# Patient Record
Sex: Male | Born: 1945 | Race: White | Hispanic: No | Marital: Married | Smoking: Former smoker
Health system: Southern US, Community
[De-identification: ages and names within clinical notes are randomized; demographics above are authoritative.]

## PROBLEM LIST (undated history)

## (undated) DIAGNOSIS — Z720 Tobacco use: Secondary | ICD-10-CM

## (undated) DIAGNOSIS — I499 Cardiac arrhythmia, unspecified: Secondary | ICD-10-CM

## (undated) DIAGNOSIS — I2510 Atherosclerotic heart disease of native coronary artery without angina pectoris: Secondary | ICD-10-CM

## (undated) DIAGNOSIS — I420 Dilated cardiomyopathy: Secondary | ICD-10-CM

## (undated) DIAGNOSIS — I5020 Unspecified systolic (congestive) heart failure: Secondary | ICD-10-CM

## (undated) DIAGNOSIS — E785 Hyperlipidemia, unspecified: Secondary | ICD-10-CM

## (undated) DIAGNOSIS — I10 Essential (primary) hypertension: Secondary | ICD-10-CM

## (undated) DIAGNOSIS — I509 Heart failure, unspecified: Secondary | ICD-10-CM

## (undated) DIAGNOSIS — C3411 Malignant neoplasm of upper lobe, right bronchus or lung: Secondary | ICD-10-CM

## (undated) DIAGNOSIS — C801 Malignant (primary) neoplasm, unspecified: Secondary | ICD-10-CM

## (undated) DIAGNOSIS — E119 Type 2 diabetes mellitus without complications: Secondary | ICD-10-CM

## (undated) DIAGNOSIS — F1010 Alcohol abuse, uncomplicated: Secondary | ICD-10-CM

## (undated) DIAGNOSIS — I639 Cerebral infarction, unspecified: Secondary | ICD-10-CM

## (undated) DIAGNOSIS — Z8719 Personal history of other diseases of the digestive system: Secondary | ICD-10-CM

## (undated) DIAGNOSIS — I219 Acute myocardial infarction, unspecified: Secondary | ICD-10-CM

## (undated) DIAGNOSIS — K219 Gastro-esophageal reflux disease without esophagitis: Secondary | ICD-10-CM

## (undated) DIAGNOSIS — I4891 Unspecified atrial fibrillation: Secondary | ICD-10-CM

## (undated) DIAGNOSIS — Z9221 Personal history of antineoplastic chemotherapy: Secondary | ICD-10-CM

## (undated) DIAGNOSIS — R0602 Shortness of breath: Secondary | ICD-10-CM

## (undated) HISTORY — DX: Alcohol abuse, uncomplicated: F10.10

## (undated) HISTORY — DX: Type 2 diabetes mellitus without complications: E11.9

## (undated) HISTORY — DX: Essential (primary) hypertension: I10

## (undated) HISTORY — DX: Dilated cardiomyopathy: I42.0

## (undated) HISTORY — DX: Malignant neoplasm of upper lobe, right bronchus or lung: C34.11

## (undated) HISTORY — DX: Unspecified systolic (congestive) heart failure: I50.20

## (undated) HISTORY — DX: Unspecified atrial fibrillation: I48.91

## (undated) HISTORY — DX: Tobacco use: Z72.0

## (undated) HISTORY — PX: CORONARY ARTERY BYPASS GRAFT: SHX141

## (undated) HISTORY — DX: Heart failure, unspecified: I50.9

## (undated) HISTORY — DX: Hyperlipidemia, unspecified: E78.5

## (undated) HISTORY — DX: Atherosclerotic heart disease of native coronary artery without angina pectoris: I25.10

---

## 2003-07-15 ENCOUNTER — Inpatient Hospital Stay (HOSPITAL_COMMUNITY): Admission: EM | Admit: 2003-07-15 | Discharge: 2003-07-17 | Payer: Self-pay | Admitting: Emergency Medicine

## 2008-12-03 HISTORY — PX: UMBILICAL HERNIA REPAIR: SHX196

## 2008-12-22 ENCOUNTER — Ambulatory Visit: Payer: Self-pay | Admitting: *Deleted

## 2008-12-23 ENCOUNTER — Encounter: Payer: Self-pay | Admitting: Cardiology

## 2008-12-23 ENCOUNTER — Inpatient Hospital Stay (HOSPITAL_COMMUNITY): Admission: EM | Admit: 2008-12-23 | Discharge: 2008-12-29 | Payer: Self-pay | Admitting: Emergency Medicine

## 2008-12-23 ENCOUNTER — Ambulatory Visit: Payer: Self-pay | Admitting: Surgery

## 2008-12-23 DIAGNOSIS — I2510 Atherosclerotic heart disease of native coronary artery without angina pectoris: Secondary | ICD-10-CM

## 2008-12-24 HISTORY — PX: CORONARY ARTERY BYPASS GRAFT: SHX141

## 2009-01-25 ENCOUNTER — Encounter: Admission: RE | Admit: 2009-01-25 | Discharge: 2009-01-25 | Payer: Self-pay | Admitting: Surgery

## 2009-01-25 ENCOUNTER — Ambulatory Visit: Payer: Self-pay | Admitting: Surgery

## 2009-01-27 ENCOUNTER — Encounter: Payer: Self-pay | Admitting: Cardiovascular Disease

## 2009-01-27 ENCOUNTER — Ambulatory Visit: Payer: Self-pay | Admitting: Cardiovascular Disease

## 2009-01-27 DIAGNOSIS — I4891 Unspecified atrial fibrillation: Secondary | ICD-10-CM | POA: Insufficient documentation

## 2009-01-27 DIAGNOSIS — E785 Hyperlipidemia, unspecified: Secondary | ICD-10-CM | POA: Insufficient documentation

## 2009-01-28 DIAGNOSIS — I5023 Acute on chronic systolic (congestive) heart failure: Secondary | ICD-10-CM

## 2009-02-01 ENCOUNTER — Ambulatory Visit: Payer: Self-pay | Admitting: Surgery

## 2009-02-01 ENCOUNTER — Ambulatory Visit: Payer: Self-pay | Admitting: Internal Medicine

## 2009-02-01 ENCOUNTER — Encounter: Admission: RE | Admit: 2009-02-01 | Discharge: 2009-02-01 | Payer: Self-pay | Admitting: Surgery

## 2009-02-02 ENCOUNTER — Encounter (INDEPENDENT_AMBULATORY_CARE_PROVIDER_SITE_OTHER): Payer: Self-pay | Admitting: General Surgery

## 2009-02-02 ENCOUNTER — Inpatient Hospital Stay (HOSPITAL_COMMUNITY): Admission: EM | Admit: 2009-02-02 | Discharge: 2009-02-11 | Payer: Self-pay | Admitting: Emergency Medicine

## 2009-02-04 ENCOUNTER — Encounter: Payer: Self-pay | Admitting: Cardiovascular Disease

## 2009-03-02 ENCOUNTER — Ambulatory Visit: Payer: Self-pay | Admitting: Cardiology

## 2009-03-03 ENCOUNTER — Ambulatory Visit: Payer: Self-pay | Admitting: Cardiovascular Disease

## 2009-03-03 ENCOUNTER — Encounter: Payer: Self-pay | Admitting: Cardiovascular Disease

## 2009-03-10 ENCOUNTER — Ambulatory Visit: Payer: Self-pay | Admitting: Cardiovascular Disease

## 2009-03-10 ENCOUNTER — Ambulatory Visit: Payer: Self-pay | Admitting: Internal Medicine

## 2009-03-10 LAB — CONVERTED CEMR LAB
CO2: 29 meq/L (ref 19–32)
Glucose, Bld: 90 mg/dL (ref 70–99)
Potassium: 4.7 meq/L (ref 3.5–5.1)
Sodium: 135 meq/L (ref 135–145)

## 2009-03-18 ENCOUNTER — Ambulatory Visit: Payer: Self-pay | Admitting: Internal Medicine

## 2009-03-21 ENCOUNTER — Telehealth: Payer: Self-pay | Admitting: Cardiovascular Disease

## 2009-04-05 ENCOUNTER — Ambulatory Visit: Payer: Self-pay | Admitting: Cardiovascular Disease

## 2009-04-05 ENCOUNTER — Ambulatory Visit: Payer: Self-pay | Admitting: Internal Medicine

## 2009-04-05 ENCOUNTER — Encounter (INDEPENDENT_AMBULATORY_CARE_PROVIDER_SITE_OTHER): Payer: Self-pay

## 2009-04-22 ENCOUNTER — Ambulatory Visit: Payer: Self-pay | Admitting: Cardiology

## 2009-05-04 ENCOUNTER — Encounter: Payer: Self-pay | Admitting: *Deleted

## 2009-05-19 ENCOUNTER — Encounter: Payer: Self-pay | Admitting: Cardiovascular Disease

## 2009-05-19 ENCOUNTER — Ambulatory Visit: Payer: Self-pay

## 2009-05-19 ENCOUNTER — Ambulatory Visit: Payer: Self-pay | Admitting: Internal Medicine

## 2009-05-19 ENCOUNTER — Ambulatory Visit: Payer: Self-pay | Admitting: Cardiovascular Disease

## 2009-05-19 LAB — CONVERTED CEMR LAB: POC INR: 4.9

## 2009-05-25 ENCOUNTER — Telehealth: Payer: Self-pay | Admitting: Cardiovascular Disease

## 2009-05-25 LAB — CONVERTED CEMR LAB
ALT: 16 units/L (ref 0–53)
Alkaline Phosphatase: 67 units/L (ref 39–117)
Bilirubin, Direct: 0.1 mg/dL (ref 0.0–0.3)
CO2: 28 meq/L (ref 19–32)
Chloride: 99 meq/L (ref 96–112)
HDL: 29.8 mg/dL — ABNORMAL LOW (ref >39.00)
LDL Cholesterol: 97 mg/dL (ref 0–99)
Potassium: 4.2 meq/L (ref 3.5–5.1)
Sodium: 134 meq/L — ABNORMAL LOW (ref 135–145)
Total Bilirubin: 0.9 mg/dL (ref 0.3–1.2)
Total CHOL/HDL Ratio: 5
Total Protein: 7.6 g/dL (ref 6.0–8.3)
Triglycerides: 116 mg/dL (ref 0.0–149.0)

## 2009-05-27 ENCOUNTER — Encounter (INDEPENDENT_AMBULATORY_CARE_PROVIDER_SITE_OTHER): Payer: Self-pay | Admitting: *Deleted

## 2009-06-02 ENCOUNTER — Ambulatory Visit: Payer: Self-pay | Admitting: Cardiology

## 2009-06-02 LAB — CONVERTED CEMR LAB: POC INR: 2.6

## 2009-06-03 ENCOUNTER — Ambulatory Visit: Payer: Self-pay | Admitting: Cardiology

## 2009-06-03 ENCOUNTER — Ambulatory Visit (HOSPITAL_COMMUNITY): Admission: RE | Admit: 2009-06-03 | Discharge: 2009-06-03 | Payer: Self-pay | Admitting: Cardiovascular Disease

## 2009-06-08 ENCOUNTER — Encounter: Payer: Self-pay | Admitting: *Deleted

## 2009-06-13 ENCOUNTER — Ambulatory Visit: Payer: Self-pay | Admitting: Cardiovascular Disease

## 2009-06-13 ENCOUNTER — Encounter (INDEPENDENT_AMBULATORY_CARE_PROVIDER_SITE_OTHER): Payer: Self-pay | Admitting: Cardiology

## 2009-06-13 LAB — CONVERTED CEMR LAB: POC INR: 3.7

## 2009-06-15 DIAGNOSIS — I5022 Chronic systolic (congestive) heart failure: Secondary | ICD-10-CM

## 2009-06-21 ENCOUNTER — Telehealth: Payer: Self-pay | Admitting: Cardiovascular Disease

## 2009-07-04 ENCOUNTER — Ambulatory Visit: Payer: Self-pay | Admitting: Internal Medicine

## 2009-07-04 LAB — CONVERTED CEMR LAB
POC INR: 3.3
Prothrombin Time: 21.8 s

## 2009-08-01 ENCOUNTER — Ambulatory Visit: Payer: Self-pay | Admitting: Cardiology

## 2009-09-06 ENCOUNTER — Ambulatory Visit: Payer: Self-pay | Admitting: Cardiology

## 2009-09-06 LAB — CONVERTED CEMR LAB: POC INR: 2.8

## 2009-09-12 ENCOUNTER — Ambulatory Visit: Payer: Self-pay | Admitting: Cardiovascular Disease

## 2009-10-04 ENCOUNTER — Ambulatory Visit: Payer: Self-pay | Admitting: Cardiovascular Disease

## 2009-10-04 ENCOUNTER — Ambulatory Visit: Payer: Self-pay | Admitting: Cardiology

## 2009-10-11 ENCOUNTER — Encounter: Payer: Self-pay | Admitting: Cardiovascular Disease

## 2009-10-11 LAB — CONVERTED CEMR LAB
AST: 21 units/L (ref 0–37)
Alkaline Phosphatase: 56 units/L (ref 39–117)
BUN: 22 mg/dL (ref 6–23)
Bilirubin, Direct: 0.1 mg/dL (ref 0.0–0.3)
CO2: 28 meq/L (ref 19–32)
Calcium: 9.2 mg/dL (ref 8.4–10.5)
Chloride: 97 meq/L (ref 96–112)
Cholesterol: 111 mg/dL (ref 0–200)
Creatinine, Ser: 1.2 mg/dL (ref 0.4–1.5)
LDL Cholesterol: 63 mg/dL (ref 0–99)
Total CHOL/HDL Ratio: 3

## 2009-11-01 ENCOUNTER — Ambulatory Visit: Payer: Self-pay | Admitting: Cardiovascular Disease

## 2009-11-21 ENCOUNTER — Ambulatory Visit: Payer: Self-pay | Admitting: Internal Medicine

## 2009-12-19 ENCOUNTER — Encounter (INDEPENDENT_AMBULATORY_CARE_PROVIDER_SITE_OTHER): Payer: Self-pay | Admitting: *Deleted

## 2009-12-19 ENCOUNTER — Ambulatory Visit: Payer: Self-pay | Admitting: Cardiology

## 2009-12-19 ENCOUNTER — Ambulatory Visit: Payer: Self-pay | Admitting: Cardiovascular Disease

## 2009-12-19 LAB — CONVERTED CEMR LAB: POC INR: 2.1

## 2010-02-06 ENCOUNTER — Ambulatory Visit: Payer: Self-pay | Admitting: Internal Medicine

## 2010-02-06 LAB — CONVERTED CEMR LAB: POC INR: 2.1

## 2010-02-16 ENCOUNTER — Telehealth: Payer: Self-pay | Admitting: Cardiovascular Disease

## 2010-04-03 ENCOUNTER — Ambulatory Visit: Payer: Self-pay | Admitting: Cardiology

## 2010-04-03 LAB — CONVERTED CEMR LAB: POC INR: 2.4

## 2010-06-02 ENCOUNTER — Ambulatory Visit: Payer: Self-pay | Admitting: Cardiovascular Disease

## 2010-06-02 ENCOUNTER — Ambulatory Visit: Payer: Self-pay | Admitting: Cardiology

## 2010-06-02 LAB — CONVERTED CEMR LAB: POC INR: 2.1

## 2010-06-09 LAB — CONVERTED CEMR LAB
Bilirubin, Direct: 0.2 mg/dL (ref 0.0–0.3)
Calcium: 8.7 mg/dL (ref 8.4–10.5)
Creatinine, Ser: 1.1 mg/dL (ref 0.4–1.5)
GFR calc non Af Amer: 69.24 mL/min (ref >60)
Sodium: 133 meq/L — ABNORMAL LOW (ref 135–145)
Total Bilirubin: 0.7 mg/dL (ref 0.3–1.2)

## 2010-07-03 ENCOUNTER — Ambulatory Visit: Payer: Self-pay | Admitting: Cardiovascular Disease

## 2010-08-01 ENCOUNTER — Ambulatory Visit: Payer: Self-pay | Admitting: Cardiovascular Disease

## 2010-08-01 LAB — CONVERTED CEMR LAB: POC INR: 1.9

## 2010-08-24 ENCOUNTER — Ambulatory Visit: Payer: Self-pay | Admitting: Cardiology

## 2010-08-24 LAB — CONVERTED CEMR LAB: POC INR: 2.3

## 2010-09-18 ENCOUNTER — Ambulatory Visit: Payer: Self-pay | Admitting: Cardiology

## 2010-09-18 LAB — CONVERTED CEMR LAB: POC INR: 2.8

## 2010-10-19 ENCOUNTER — Ambulatory Visit: Payer: Self-pay | Admitting: Cardiovascular Disease

## 2010-10-19 LAB — CONVERTED CEMR LAB: POC INR: 2.1

## 2010-11-22 ENCOUNTER — Ambulatory Visit: Payer: Self-pay | Admitting: Cardiovascular Disease

## 2010-11-22 ENCOUNTER — Ambulatory Visit: Payer: Self-pay | Admitting: Cardiology

## 2010-11-22 LAB — CONVERTED CEMR LAB: POC INR: 2.3

## 2010-12-03 HISTORY — PX: HERNIA REPAIR: SHX51

## 2010-12-21 ENCOUNTER — Ambulatory Visit: Admission: RE | Admit: 2010-12-21 | Discharge: 2010-12-21 | Payer: Self-pay | Source: Home / Self Care

## 2010-12-21 ENCOUNTER — Other Ambulatory Visit: Payer: Self-pay | Admitting: Cardiovascular Disease

## 2010-12-21 ENCOUNTER — Ambulatory Visit
Admission: RE | Admit: 2010-12-21 | Discharge: 2010-12-21 | Payer: Self-pay | Source: Home / Self Care | Attending: Cardiovascular Disease | Admitting: Cardiovascular Disease

## 2010-12-21 LAB — HEPATIC FUNCTION PANEL
ALT: 17 U/L (ref 0–53)
AST: 24 U/L (ref 0–37)
Albumin: 3.8 g/dL (ref 3.5–5.2)
Alkaline Phosphatase: 51 U/L (ref 39–117)
Bilirubin, Direct: 0.2 mg/dL (ref 0.0–0.3)
Total Bilirubin: 0.7 mg/dL (ref 0.3–1.2)
Total Protein: 7.3 g/dL (ref 6.0–8.3)

## 2010-12-21 LAB — BASIC METABOLIC PANEL
BUN: 18 mg/dL (ref 6–23)
CO2: 28 mEq/L (ref 19–32)
Calcium: 8.9 mg/dL (ref 8.4–10.5)
Chloride: 96 mEq/L (ref 96–112)
Creatinine, Ser: 1.1 mg/dL (ref 0.4–1.5)
GFR: 71.3 mL/min (ref >60.00)
Glucose, Bld: 93 mg/dL (ref 70–99)
Potassium: 4.5 mEq/L (ref 3.5–5.1)
Sodium: 129 mEq/L — ABNORMAL LOW (ref 135–145)

## 2010-12-21 LAB — LIPID PANEL
Cholesterol: 102 mg/dL (ref 0–200)
HDL: 40.4 mg/dL (ref >39.00)
LDL Cholesterol: 45 mg/dL (ref 0–99)
Total CHOL/HDL Ratio: 3
Triglycerides: 85 mg/dL (ref 0.0–149.0)
VLDL: 17 mg/dL (ref 0.0–40.0)

## 2011-01-02 NOTE — Medication Information (Signed)
Summary: rov/sel  Anticoagulant Therapy  Managed by: Lyna Poser, PharmD Referring MD: Tonny Bollman PCP: [facility] Physcians Supervising MD: Clifton James MD,Christopher Indication 1: Atrial Fibrillation (ICD-427.31) Lab Used: [facility] [place] Site: [address] INR POC 2.1 INR RANGE 2 - 3  Dietary changes: no    Health status changes: no    Bleeding/hemorrhagic complications: no    Recent/future hospitalizations: no    Any changes in medication regimen? no    Recent/future dental: no  Any missed doses?: no       Is patient compliant with meds? yes       Allergies: No Known Drug Allergies  Anticoagulation Management History:      The patient is taking warfarin and comes in today for a routine follow up visit.  Positive risk factors for bleeding include an age of 66 years or older.  The bleeding index is 'intermediate risk'.  Positive CHADS2 values include History of CHF.  Negative CHADS2 values include Age > 47 years old.  The start date was 02/04/2009.  His last INR was 2.1.  Anticoagulation responsible provider: Clifton James MD,Christopher.  INR POC: 2.1.  Cuvette Lot#: [id number].  Exp: 11/03/2011.    Anticoagulation Management Assessment/Plan:      The patient's current anticoagulation dose is Warfarin sodium 2 mg tabs: Use as directed by Anticoagualtion Clinic.  The target INR is 2.0-3.0.  The next INR is due 11/22/2010.  Anticoagulation instructions were given to patient.  Results were reviewed/authorized by Lyna Poser, PharmD.         Prior Anticoagulation Instructions: INR 2.8  Continue taking 1 tablet everyday except 2 tablets on Thursday. Recheck in 4 weeks.   Current Anticoagulation Instructions: INR 2.1 Continue taking 2 tablets on thursday. And 1 tablet all other days. Recheck in 4 weeks.

## 2011-01-02 NOTE — Medication Information (Signed)
Summary: ROV/KH  Anticoagulant Therapy  Managed by: Reina Fuse, PharmD Referring MD: Tonny Bollman PCP: [facility] Physcians Supervising MD: Eden Emms MD, Theron Arista Indication 1: Atrial Fibrillation (ICD-427.31) Lab Used: [facility] [place] Site: [address] INR POC 1.9 INR RANGE 2 - 3  Dietary changes: no    Health status changes: no    Bleeding/hemorrhagic complications: no    Recent/future hospitalizations: no    Any changes in medication regimen? no    Recent/future dental: no  Any missed doses?: no       Is patient compliant with meds? yes       Allergies: No Known Drug Allergies  Anticoagulation Management History:      The patient is taking warfarin and comes in today for a routine follow up visit.  Positive risk factors for bleeding include an age of 66 years or older.  The bleeding index is 'intermediate risk'.  Positive CHADS2 values include History of CHF.  Negative CHADS2 values include Age > 41 years old.  The start date was 02/04/2009.  His last INR was 2.1.  Anticoagulation responsible Sascha Palma: Eden Emms MD, Theron Arista.  INR POC: 1.9.  Cuvette Lot#: [id number].  Exp: 09/2011.    Anticoagulation Management Assessment/Plan:      The patient's current anticoagulation dose is Warfarin sodium 2 mg tabs: Use as directed by Anticoagualtion Clinic.  The target INR is 2.0-3.0.  The next INR is due 08/22/2010.  Anticoagulation instructions were given to patient.  Results were reviewed/authorized by Reina Fuse, PharmD.  He was notified by Reina Fuse, PharmD.         Prior Anticoagulation Instructions: INR 2.4  Continue same dose of 1 tablet everyday except 2 tablets on Thursday. Recheck INR on 8/29.   Current Anticoagulation Instructions: INR 1.9  Tonight, Tuesday, August 30th, take Coumadin 1.5 tabs (3 mg). Then, continue taking Coumadin 1 tab (2 mg) all day except Coumadin 2 tabs (4 mg) on Thursdays. Return to clinic in 3 weeks.

## 2011-01-02 NOTE — Assessment & Plan Note (Signed)
Summary: f/u 6 months   Visit Type:  6 months follow up Primary Provider:  [facility]  CC:  No complaints.  History of Present Illness: This is a 65 year old gentleman with CAD status post CABG, severely reduced LVEF at time of diagnosis which is now recovered, and post-CABG atrial fibrillation, presenting today for followup evaluation.   He has no complaints today. He denies chest pain, dyspnea, or edema. No palps, lightheadedness, or syncope. Other than his work, he reports being physically inactive.  Current Medications (verified): 1)  Lisinopril 20 Mg Tabs (Lisinopril) .... Take One Tablet By Mouth Daily 2)  Potassium Chloride Crys Cr 20 Meq Cr-Tabs (Potassium Chloride Crys Cr) .... Take 1 Tablet By Mouth Once A Day 3)  Lasix 80 Mg Tabs (Furosemide) .... Take 1 Tablet By Mouth Once A Day 4)  Aspirin 81 Mg Tbec (Aspirin) .... Take One Tablet By Mouth Daily 5)  Warfarin Sodium 2 Mg Tabs (Warfarin Sodium) .... Use As Directed By Anticoagualtion Clinic 6)  Carvedilol 25 Mg Tabs (Carvedilol) .... Take One Tablet By Mouth Twice A Day 7)  Simvastatin 40 Mg Tabs (Simvastatin) .... Take One Tablet By Mouth Daily At Bedtime  Allergies (verified): No Known Drug Allergies  Past History:  Past medical history reviewed for relevance to current acute and chronic problems.  Past Medical History: CONGESTIVE HEART FAILURE (ICD-428.0)--Ischemic CAD, ARTERY BYPASS GRAFT (ICD-414.04)--CABGx4 12/24/2008 Dyslipidemia HTN Atrial fibrillation  Review of Systems       Negative except as per HPI   Vital Signs:  Patient profile:   65 year old male Height:      67 inches Weight:      195.50 pounds BMI:     30.73 Pulse rate:   91 / minute Pulse rhythm:   irregular Resp:     18 per minute BP sitting:   100 / 64  (left arm) Cuff size:   large  Vitals Entered By: Vikki Ports (July 03, 2010 4:06 PM)  Physical Exam  General:  Pt is alert and oriented, obese male, in no acute  distress. HEENT: normal Neck: normal carotid upstrokes without bruits, JVP normal Lungs: CTA CV: irregularly irregular without murmur or gallop Abd: soft, NT, positive BS, no bruit, no organomegaly Ext: no clubbing, cyanosis, or edema. peripheral pulses 2+ and equal Skin: warm and dry without rash    EKG  Procedure date:  07/03/2010  Findings:      Atrial fibrillation with LAFB, ST-T changes consider lateral ischemia, HR 91 bpm.  Impression & Recommendations:  Problem # 1:  SYSTOLIC HEART FAILURE, CHRONIC (ICD-428.22) Volume status is stable, but pt is in AFib today (see below). Continue ACE, carvedilol, furosemide. LVEF improved at most recent assessment.  His updated medication list for this problem includes:    Lisinopril 20 Mg Tabs (Lisinopril) .Marland Kitchen... Take one tablet by mouth daily    Lasix 80 Mg Tabs (Furosemide) .Marland Kitchen... Take 1 tablet by mouth once a day    Aspirin 81 Mg Tbec (Aspirin) .Marland Kitchen... Take one tablet by mouth daily    Warfarin Sodium 2 Mg Tabs (Warfarin sodium) ..... Use as directed by anticoagualtion clinic    Carvedilol 25 Mg Tabs (Carvedilol) .Marland Kitchen... Take one tablet by mouth twice a day  Orders: EKG w/ Interpretation (93000)  Problem # 2:  ATRIAL FIBRILLATION (ICD-427.31) Pt with recurrent AF. I counseled him regarding the importance of alcohol cessation. He will continue his current meds and f/u in one month. If he  remains in AF will consider cardioversion at that time.  His updated medication list for this problem includes:    Aspirin 81 Mg Tbec (Aspirin) .Marland Kitchen... Take one tablet by mouth daily    Warfarin Sodium 2 Mg Tabs (Warfarin sodium) ..... Use as directed by anticoagualtion clinic    Carvedilol 25 Mg Tabs (Carvedilol) .Marland Kitchen... Take one tablet by mouth twice a day  Orders: EKG w/ Interpretation (93000)  Problem # 3:  HYPERLIPIDEMIA-MIXED (ICD-272.4) Lipids at goal.  His updated medication list for this problem includes:    Simvastatin 40 Mg Tabs  (Simvastatin) .Marland Kitchen... Take one tablet by mouth daily at bedtime  CHOL: 111 (10/04/2009)   LDL: 63 (10/04/2009)   HDL: 35.20 (10/04/2009)   TG: 66.0 (10/04/2009)  Problem # 4:  CAD, ARTERY BYPASS GRAFT (ICD-414.04) Stable without angina.  His updated medication list for this problem includes:    Lisinopril 20 Mg Tabs (Lisinopril) .Marland Kitchen... Take one tablet by mouth daily    Aspirin 81 Mg Tbec (Aspirin) .Marland Kitchen... Take one tablet by mouth daily    Warfarin Sodium 2 Mg Tabs (Warfarin sodium) ..... Use as directed by anticoagualtion clinic    Carvedilol 25 Mg Tabs (Carvedilol) .Marland Kitchen... Take one tablet by mouth twice a day  Orders: EKG w/ Interpretation (93000)  Patient Instructions: 1)  Your physician recommends that you schedule a follow-up appointment in: 1 MONTH 2)  Your physician recommends that you continue on your current medications as directed. Please refer to the Current Medication list given to you today. 3)  Discontinue alcohol use. Prescriptions: POTASSIUM CHLORIDE CRYS CR 20 MEQ CR-TABS (POTASSIUM CHLORIDE CRYS CR) Take 1 tablet by mouth once a day  #90 x 3   Entered by:   Julieta Gutting, RN, BSN   Authorized by:   Norva Karvonen, MD   Signed by:   Julieta Gutting, RN, BSN on 07/03/2010   Method used:   Electronically to        [facility]  [address] [phone] (retail)       [address]       [place], [place]  [address]       Ph: [phone] or [phone]       Fax: [phone]   RxID:   [id number]

## 2011-01-02 NOTE — Medication Information (Signed)
Summary: rov/ewj  Anticoagulant Therapy  Managed by: Loma Newton, PharmD Referring MD: Tonny Bollman PCP: [facility] Physcians Supervising MD: Ladona Ridgel MD, Sharlot Gowda Indication 1: Atrial Fibrillation (ICD-427.31) Lab Used: [facility] [place] Site: [address] INR POC 2.1 INR RANGE 2 - 3  Dietary changes: no    Health status changes: no    Bleeding/hemorrhagic complications: no    Recent/future hospitalizations: no    Any changes in medication regimen? no    Recent/future dental: no  Any missed doses?: yes     Details: missed one dose last week  Is patient compliant with meds? yes      Comments: Patient is having a difficult time getting off of work to have his INR checked.  He has been therapeutic for several months so I scheduled him to come back in 8 weeks per pts. request.  He will try to make an appt. sooner if he is able to get time off of work.   Current Medications (verified): 1)  Lisinopril 20 Mg Tabs (Lisinopril) .... Take One Tablet By Mouth Daily 2)  Potassium Chloride Crys Cr 20 Meq Cr-Tabs (Potassium Chloride Crys Cr) .... Take 1 Tablet By Mouth Once A Day 3)  Lasix 80 Mg Tabs (Furosemide) .... Take 1 Tablet By Mouth Once A Day 4)  Aspirin 81 Mg Tbec (Aspirin) .... Take One Tablet By Mouth Daily 5)  Warfarin Sodium 2 Mg Tabs (Warfarin Sodium) .... Use As Directed By Anticoagualtion Clinic 6)  Carvedilol 25 Mg Tabs (Carvedilol) .... Take One Tablet By Mouth Twice A Day 7)  Simvastatin 40 Mg Tabs (Simvastatin) .... Take One Tablet By Mouth Daily At Bedtime  Allergies (verified): No Known Drug Allergies  Anticoagulation Management History:      The patient is taking warfarin and comes in today for a routine follow up visit.  Negative risk factors for bleeding include an age less than 23 years old.  The bleeding index is 'low risk'.  Positive CHADS2 values include History of CHF.  Negative CHADS2 values include Age > 66 years old.  The start  date was 02/04/2009.  Today's INR is 2.1.  Anticoagulation responsible provider: Ladona Ridgel MD, Sharlot Gowda.  INR POC: 2.1.  Cuvette Lot#: [id number].  Exp: 05/03/2011.    Anticoagulation Management Assessment/Plan:      The patient's current anticoagulation dose is Warfarin sodium 2 mg tabs: Use as directed by Anticoagualtion Clinic.  The target INR is 2.0-3.0.  The next INR is due 04/03/2010.  Anticoagulation instructions were given to patient.  Results were reviewed/authorized by Loma Newton, PharmD.  He was notified by Loma Newton.         Prior Anticoagulation Instructions: INR 2.1  Continue on same dosage 2 mg daily except 4mg  on Thursdays.   Recheck in 4 weeks.    Current Anticoagulation Instructions: INR = 2.1  The patient is to continue with the same dose of coumadin.  This dosage includes: so take 1 tablet every day except for thursday take 2 tablets.

## 2011-01-02 NOTE — Assessment & Plan Note (Signed)
Summary: 1 month rov    Visit Type:  1 month follow up Primary Provider:  [facility]  CC:  none.  History of Present Illness: This is a 65 year old gentleman with CAD status post CABG, severely reduced LVEF at time of diagnosis which is now recovered, and post-CABG atrial fibrillation, presenting today for followup evaluation. He was diagnosed with recurrent atrial fibrillation at his last office visit, and he returns today for followup evaluation.   He denies palps, dyspnea, chest pain, edema, lightheadedness, or syncope. He realizes that he frequently forgets his medications. He also does not follow a prudent diet. Reports frequent intake of caffeinated soft drinks. He has cut back on alcohol, but still drinks.  Current Medications (verified): 1)  Lisinopril 20 Mg Tabs (Lisinopril) .... Take One Tablet By Mouth Daily 2)  Potassium Chloride Crys Cr 20 Meq Cr-Tabs (Potassium Chloride Crys Cr) .... Take 1 Tablet By Mouth Once A Day 3)  Lasix 80 Mg Tabs (Furosemide) .... Take 1 Tablet By Mouth Once A Day 4)  Aspirin 81 Mg Tbec (Aspirin) .... Take One Tablet By Mouth Daily 5)  Warfarin Sodium 2 Mg Tabs (Warfarin Sodium) .... Use As Directed By Anticoagualtion Clinic 6)  Carvedilol 25 Mg Tabs (Carvedilol) .... Take One Tablet By Mouth Twice A Day 7)  Simvastatin 40 Mg Tabs (Simvastatin) .... Take One Tablet By Mouth Daily At Bedtime  Allergies (verified): No Known Drug Allergies  Past History:  Past medical history reviewed for relevance to current acute and chronic problems.  Past Medical History: Reviewed history from 07/03/2010 and no changes required. 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 HEENT: normal Neck: normal carotid upstrokes without bruits, JVP normal Lungs: CTA CV: irregularly irregular without murmur or gallop Abd: soft, NT, positive BS, no bruit, no organomegaly, obese Ext: no clubbing, cyanosis, or edema. peripheral pulses 2+ and equal Skin: warm and dry without rash    EKG  Procedure date:  08/01/2010  Findings:      Atrial fibrillation 64 bpm, LAFB, nonspecific ST-T abnormality.  Impression & Recommendations:  Problem # 1:  ATRIAL FIBRILLATION (ICD-427.31) Remains in AF, but no symptoms of CHF and no complaints whatsoever. Favor rate-control and anticoagulation at this point as he would likely have recurrent AF even if cardioverted again. His anti-dysrhythmic optionis are limited in the setting of CAD and CHF.  His updated medication list for this problem includes:    Aspirin 81 Mg Tbec (Aspirin) .Marland Kitchen... Take one tablet by mouth daily    Warfarin Sodium 2 Mg Tabs (Warfarin sodium) ..... Use as directed by anticoagualtion clinic    Carvedilol 25 Mg Tabs (Carvedilol) .Marland Kitchen... 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 We discussed the importance of weight loss, reduction in caffeine, and alcohol cessation.  His  updated medication list for this problem includes:    Lisinopril 20 Mg Tabs (Lisinopril) .Marland Kitchen... Take one tablet by mouth daily    Lasix 80 Mg Tabs (Furosemide) .Marland Kitchen... Take 1 tablet by mouth once a day    Aspirin 81 Mg Tbec (Aspirin) .Marland Kitchen... Take one tablet by mouth daily    Warfarin Sodium 2 Mg Tabs (Warfarin sodium) ..... Use as directed by anticoagualtion clinic    Carvedilol 25 Mg Tabs (Carvedilol) .Marland Kitchen... Take one tablet by mouth twice a  day  Other Orders: EKG w/ Interpretation (93000)  Patient Instructions: 1)  Your physician recommends that you schedule a follow-up appointment in: 4 MONTHS 2)  Your physician recommends that you continue on your current medications as directed. Please refer to the Current Medication list given to you today.

## 2011-01-02 NOTE — Medication Information (Signed)
Summary: rov/sl  Anticoagulant Therapy  Managed by: Lyna Poser, PharmD Referring MD: Tonny Bollman PCP: [facility] Physcians Supervising MD: Eden Emms MD, Theron Arista Indication 1: Atrial Fibrillation (ICD-427.31) Lab Used: [facility] [place] Site: [address] INR POC 2.3 INR RANGE 2 - 3  Dietary changes: no    Health status changes: no    Bleeding/hemorrhagic complications: no    Recent/future hospitalizations: no    Any changes in medication regimen? no    Recent/future dental: no  Any missed doses?: yes     Details: missed yesterday so took yesterday's dose this morning  Is patient compliant with meds? yes       Allergies: No Known Drug Allergies  Anticoagulation Management History:      The patient is taking warfarin and comes in today for a routine follow up visit.  Positive risk factors for bleeding include an age of 66 years or older.  The bleeding index is 'intermediate risk'.  Positive CHADS2 values include History of CHF.  Negative CHADS2 values include Age > 66 years old.  The start date was 02/04/2009.  His last INR was 2.1.  Anticoagulation responsible Oneil Behney: Eden Emms MD, Theron Arista.  INR POC: 2.3.  Cuvette Lot#: [id number].  Exp: 09/2011.    Anticoagulation Management Assessment/Plan:      The patient's current anticoagulation dose is Warfarin sodium 2 mg tabs: Use as directed by Anticoagualtion Clinic.  The target INR is 2.0-3.0.  The next INR is due 09/18/2010.  Anticoagulation instructions were given to patient.  Results were reviewed/authorized by Lyna Poser, PharmD.         Prior Anticoagulation Instructions: INR 1.9  Tonight, Tuesday, August 30th, take Coumadin 1.5 tabs (3 mg). Then, continue taking Coumadin 1 tab (2 mg) all day except Coumadin 2 tabs (4 mg) on Thursdays. Return to clinic in 3 weeks.   Current Anticoagulation Instructions: INR 2.3 Since you missed yesterday's dose just take 1 more tablet today to make 2 tablets. Tomorrow  take 2 tablets. Then go back to regular schedule. Continue taking 1 tablet everyday except take 2 tablets on thursday. No changes. Recheck in 4 weeks.

## 2011-01-02 NOTE — Letter (Signed)
Summary: Return To Work  [employer], Main Office  [address]   [place], [place] [address]   Phone: [phone]  Fax: [phone]    12/19/2009  TO: Leodis Sias IT MAY CONCERN   RE: Alejandro [name] [address] [id number] Mr Hardenbrook may continue to work but must not work any overtime due to heart disease    If you have any further questions or need additional information, please call.     Sincerely,    Ledon Snare, RN

## 2011-01-02 NOTE — Medication Information (Signed)
Summary: rov/mb  Anticoagulant Therapy  Managed by: Bethena Midget, RN, BSN Referring MD: Tonny Bollman PCP: [facility] Supervising MD: Juanda Chance MD, Nusrat Encarnacion Indication 1: Atrial Fibrillation (ICD-427.31) Lab Used: [facility] [place] Site: [address] INR POC 2.4 INR RANGE 2 - 3  Dietary changes: no    Health status changes: no    Bleeding/hemorrhagic complications: no    Recent/future hospitalizations: no    Any changes in medication regimen? no    Recent/future dental: no  Any missed doses?: no       Is patient compliant with meds? yes      Comments: Pt. states he can't make 4 weeks appt he scheduled for 06/01/10 due to his job and point system.   Allergies: No Known Drug Allergies  Anticoagulation Management History:      The patient is taking warfarin and comes in today for a routine follow up visit.  Negative risk factors for bleeding include an age less than 66 years old.  The bleeding index is 'low risk'.  Positive CHADS2 values include History of CHF.  Negative CHADS2 values include Age > 66 years old.  The start date was 02/04/2009.  His last INR was 2.1.  Anticoagulation responsible provider: Juanda Chance MD, Smitty Cords.  INR POC: 2.4.  Cuvette Lot#: [id number].  Exp: 05/2011.    Anticoagulation Management Assessment/Plan:      The patient's current anticoagulation dose is Warfarin sodium 2 mg tabs: Use as directed by Anticoagualtion Clinic.  The target INR is 2.0-3.0.  The next INR is due 06/01/2010.  Anticoagulation instructions were given to patient.  Results were reviewed/authorized by Bethena Midget, RN, BSN.  He was notified by Bethena Midget, RN, BSN.         Prior Anticoagulation Instructions: INR = 2.1  The patient is to continue with the same dose of coumadin.  This dosage includes: so take 1 tablet every day except for thursday take 2 tablets.   Current Anticoagulation Instructions: INR 2.4 Continue 2mg s daily except 4mg s on Thursdays. Recheck  in 4 weeks.

## 2011-01-02 NOTE — Medication Information (Signed)
Summary: rov/ewj  Anticoagulant Therapy  Managed by: Aubery Lapping.D. Referring MD: Tonny Bollman PCP: [facility]ians Supervising MD: Eden Emms MD, Theron Arista Indication 1: Atrial Fibrillation (ICD-427.31) Lab Used: [facility] [place] Site: [address] INR POC 2.4 INR RANGE 2 - 3  Dietary changes: no    Health status changes: no    Bleeding/hemorrhagic complications: no    Recent/future hospitalizations: no    Any changes in medication regimen? no    Recent/future dental: no  Any missed doses?: no       Is patient compliant with meds? yes       Allergies: No Known Drug Allergies  Anticoagulation Management History:      The patient is taking warfarin and comes in today for a routine follow up visit.  Positive risk factors for bleeding include an age of 66 years or older.  The bleeding index is 'intermediate risk'.  Positive CHADS2 values include History of CHF.  Negative CHADS2 values include Age > 5 years old.  The start date was 02/04/2009.  His last INR was 2.1.  Anticoagulation responsible provider: Eden Emms MD, Theron Arista.  INR POC: 2.4.  Cuvette Lot#: [id number].  Exp: 09/2011.    Anticoagulation Management Assessment/Plan:      The patient's current anticoagulation dose is Warfarin sodium 2 mg tabs: Use as directed by Anticoagualtion Clinic.  The target INR is 2.0-3.0.  The next INR is due 07/31/2010.  Anticoagulation instructions were given to patient.  Results were reviewed/authorized by [place]ard, Morrie Sheldon.D..  He was notified by Tammy Sours PharmD.         Prior Anticoagulation Instructions: INR 2.1  Continue on same dosage 1 tablet daily except 2 tablets on Thursdays.  Recheck in 4 weeks.    Current Anticoagulation Instructions: INR 2.4  Continue same dose of 1 tablet everyday except 2 tablets on Thursday. Recheck INR on 8/29.

## 2011-01-02 NOTE — Progress Notes (Signed)
Summary: speak to dr  Phone Note Call from Patient Call back at Home Phone [phone]   Caller: Spouse/Barbara Reason for Call: Talk to Doctor Summary of Call: Request to speak to Dr Excell Seltzer about her husband health.... pt is drinking and smoking again, what will this affect Initial call taken by: Migdalia Dk,  February 16, 2010 4:45 PM  Follow-up for Phone Call        talked with wife--wife concerned because pt has started drinking again(beer) and because she thinks he has started smoking again(she thinks she can smell cigarettes on his clothing) -she states pt would deny  that he is smoking so she does not think he would be receptive to something to help him stop smoking --she does not want pt to know that she has called-- I will forward to Dr Excell Seltzer for review     Appended Document: speak to dr Rudean Curt out of the office until the week after next. Will call her when I return.

## 2011-01-02 NOTE — Assessment & Plan Note (Signed)
Summary: 3 month rov f/u event/sl   Visit Type:  3 months follow up Primary Provider:  [facility]  CC:  No complaints.  History of Present Illness: This is a 65 year old gentleman with CAD status post CABG, severely reduced LVEF at time of diagnosis which is now recovered, and post-CABG atrial fibrillation, presenting today for followup evaluation. The pt is doing well at present. The patient denies chest pain, dyspnea, orthopnea, PND, edema, palpitations, lightheadedness, or syncope.  He has had no recurrence of atrial fibrillation since the post-op period. He is not engaged in regular exercise.   Current Medications (verified): 1)  Lisinopril 20 Mg Tabs (Lisinopril) .... Take One Tablet By Mouth Daily 2)  Potassium Chloride Crys Cr 20 Meq Cr-Tabs (Potassium Chloride Crys Cr) .... Take 1 Tablet By Mouth Once A Day 3)  Lasix 80 Mg Tabs (Furosemide) .... Take 1 Tablet By Mouth Once A Day 4)  Aspirin 81 Mg Tbec (Aspirin) .... Take One Tablet By Mouth Daily 5)  Warfarin Sodium 2 Mg Tabs (Warfarin Sodium) .... Use As Directed By Anticoagualtion Clinic 6)  Carvedilol 25 Mg Tabs (Carvedilol) .... Take One Tablet By Mouth Twice A Day 7)  Simvastatin 40 Mg Tabs (Simvastatin) .... Take One Tablet By Mouth Daily At Bedtime  Allergies (verified): No Known Drug Allergies  Past History:  Past medical history reviewed for relevance to current acute and chronic problems.  Past Medical History: CONGESTIVE HEART FAILURE (ICD-428.0)--Ischemic CAD, ARTERY BYPASS GRAFT (ICD-414.04)--CABGx4 12/24/2008 Dyslipidemia HTN  Review of Systems       Negative except as per HPI   Vital Signs:  Patient profile:   65 year old male Height:      67 inches Weight:      212 pounds BMI:     33.32 Pulse rate:   52 / minute Pulse rhythm:   regular Resp:     16 per minute BP sitting:   140 / 62  (left arm) Cuff size:   large  Vitals Entered By: Vikki Ports (December 19, 2009 8:37  AM)  Physical Exam  General:  Pt is alert and oriented, in no acute distress. HEENT: normal Neck: normal carotid upstrokes without bruits, JVP normal Lungs: CTA CV: RRR without murmur or gallop Abd: soft, NT, positive BS, no bruit, no organomegaly Ext: no clubbing, cyanosis, or edema. peripheral pulses 2+ and equal Skin: warm and dry without rash    Impression & Recommendations:  Problem # 1:  CAD, ARTERY BYPASS GRAFT (ICD-414.04) Stable without angina. Pt on appropriate medical therapy as below. Follow-up in 6 months.  His updated medication list for this problem includes:    Lisinopril 20 Mg Tabs (Lisinopril) .Marland Kitchen... Take one tablet by mouth daily    Aspirin 81 Mg Tbec (Aspirin) .Marland Kitchen... Take one tablet by mouth daily    Warfarin Sodium 2 Mg Tabs (Warfarin sodium) ..... Use as directed by anticoagualtion clinic    Carvedilol 25 Mg Tabs (Carvedilol) .Marland Kitchen... Take one tablet by mouth twice a day  Problem # 2:  ATRIAL FIBRILLATION (ICD-427.31) Occurred postoperatively without recurrence. Continue warfarin another 6 months and consider discontinuation at follow-up if he remains in sinus rhythm over the next 6 months.  His updated medication list for this problem includes:    Aspirin 81 Mg Tbec (Aspirin) .Marland Kitchen... Take one tablet by mouth daily    Warfarin Sodium 2 Mg Tabs (Warfarin sodium) ..... Use as directed by anticoagualtion clinic    Carvedilol 25 Mg Tabs (Carvedilol) .Marland KitchenMarland KitchenMarland KitchenMarland Kitchen  Take one tablet by mouth twice a day  Problem # 3:  HYPERLIPIDEMIA-MIXED (ICD-272.4) Lipids at goal with LDL less than 70 mg/dL. Continue current Rx with f/u labs at 6 month f/u.  His updated medication list for this problem includes:    Simvastatin 40 Mg Tabs (Simvastatin) .Marland Kitchen... Take one tablet by mouth daily at bedtime  CHOL: 111 (10/04/2009)   LDL: 63 (10/04/2009)   HDL: 35.20 (10/04/2009)   TG: 66.0 (10/04/2009)  Problem # 4:  SYSTOLIC HEART FAILURE, CHRONIC (ICD-428.22) Stable. Cont same meds.  His  updated medication list for this problem includes:    Lisinopril 20 Mg Tabs (Lisinopril) .Marland Kitchen... Take one tablet by mouth daily    Lasix 80 Mg Tabs (Furosemide) .Marland Kitchen... Take 1 tablet by mouth once a day    Aspirin 81 Mg Tbec (Aspirin) .Marland Kitchen... Take one tablet by mouth daily    Warfarin Sodium 2 Mg Tabs (Warfarin sodium) ..... Use as directed by anticoagualtion clinic    Carvedilol 25 Mg Tabs (Carvedilol) .Marland Kitchen... Take one tablet by mouth twice a day  Patient Instructions: 1)  Your physician recommends that you schedule a follow-up appointment in: july 2011 2)  Your physician recommends that you return for lab work in: cmet and liver/lipids same day as f/u appoint 3)  Your physician deems you medically cleared to return to work.  A return to work note was provided today. mr kenneth may return to work,but is unable to work overtime due to heart disease

## 2011-01-02 NOTE — Miscellaneous (Signed)
Summary: Orders Update  Clinical Lists Changes  Orders: Added new Test order of TLB-BMP (Basic Metabolic Panel-BMET) (80048-METABOL) - Signed Added new Test order of TLB-Hepatic/Liver Function Pnl (80076-HEPATIC) - Signed Added new Test order of TLB-Lipid Panel (80061-LIPID) - Signed

## 2011-01-02 NOTE — Medication Information (Signed)
Summary: rov/mow  Anticoagulant Therapy  Managed by: Weston Brass, PharmD Referring MD: Tonny Bollman PCP: [facility] Supervising MD: Daleen Squibb MD, Maisie Fus Indication 1: Atrial Fibrillation (ICD-427.31) Lab Used: [facility] [place] Site: [address] INR POC 2.8 INR RANGE 2 - 3  Dietary changes: no    Health status changes: no    Bleeding/hemorrhagic complications: no    Recent/future hospitalizations: no    Any changes in medication regimen? no    Recent/future dental: no  Any missed doses?: no       Is patient compliant with meds? yes       Allergies: No Known Drug Allergies  Anticoagulation Management History:      The patient is taking warfarin and comes in today for a routine follow up visit.  Positive risk factors for bleeding include an age of 66 years or older.  The bleeding index is 'intermediate risk'.  Positive CHADS2 values include History of CHF.  Negative CHADS2 values include Age > 24 years old.  The start date was 02/04/2009.  His last INR was 2.1.  Anticoagulation responsible provider: Daleen Squibb MD, Maisie Fus.  INR POC: 2.8.  Cuvette Lot#: [id number].  Exp: 10/2011.    Anticoagulation Management Assessment/Plan:      The patient's current anticoagulation dose is Warfarin sodium 2 mg tabs: Use as directed by Anticoagualtion Clinic.  The target INR is 2.0-3.0.  The next INR is due 10/19/2010.  Anticoagulation instructions were given to patient.  Results were reviewed/authorized by Weston Brass, PharmD.  He was notified by Ilean Skill D candidate.         Prior Anticoagulation Instructions: INR 2.3 Since you missed yesterday's dose just take 1 more tablet today to make 2 tablets. Tomorrow take 2 tablets. Then go back to regular schedule. Continue taking 1 tablet everyday except take 2 tablets on thursday. No changes. Recheck in 4 weeks.  Current Anticoagulation Instructions: INR 2.8  Continue taking 1 tablet everyday except 2 tablets on  Thursday. Recheck in 4 weeks.

## 2011-01-02 NOTE — Medication Information (Signed)
Summary: ROV/TM  Anticoagulant Therapy  Managed by: Cloyde Reams, RN, BSN Referring MD: Tonny Bollman PCP: [facility] Supervising MD: Shirlee Latch MD, Oshea Percival Indication 1: Atrial Fibrillation (ICD-427.31) Lab Used: [facility] [place] Site: [address] INR POC 2.1 INR RANGE 2 - 3  Dietary changes: no    Health status changes: no    Bleeding/hemorrhagic complications: no    Recent/future hospitalizations: no    Any changes in medication regimen? no    Recent/future dental: no  Any missed doses?: yes     Details: Missed last night's dosage, but took this am.  Is patient compliant with meds? yes       Allergies (verified): No Known Drug Allergies  Anticoagulation Management History:      The patient is taking warfarin and comes in today for a routine follow up visit.  Negative risk factors for bleeding include an age less than 73 years old.  The bleeding index is 'low risk'.  Positive CHADS2 values include History of CHF.  Negative CHADS2 values include Age > 42 years old.  The start date was 02/04/2009.  Anticoagulation responsible provider: Shirlee Latch MD, Markala Sitts.  INR POC: 2.1.  Cuvette Lot#: [id number].  Exp: 03/2011.    Anticoagulation Management Assessment/Plan:      The patient's current anticoagulation dose is Warfarin sodium 2 mg tabs: Use as directed by Anticoagualtion Clinic.  The target INR is 2.0-3.0.  The next INR is due 01/16/2010.  Anticoagulation instructions were given to patient.  Results were reviewed/authorized by Cloyde Reams, RN, BSN.  He was notified by Cloyde Reams RN.         Prior Anticoagulation Instructions: INR 2.2 Continue 2mg s everyday except 4mg s on Thursdays. Recheck in 4 weeks.   Current Anticoagulation Instructions: INR 2.1  Continue on same dosage 2 mg daily except 4mg  on Thursdays.   Recheck in 4 weeks.

## 2011-01-02 NOTE — Medication Information (Signed)
Summary: rov/tm  Anticoagulant Therapy  Managed by: Cloyde Reams, RN, BSN Referring MD: Tonny Bollman PCP: [facility]ians Supervising MD: Juanda Chance MD, Gigi Onstad Indication 1: Atrial Fibrillation (ICD-427.31) Lab Used: [facility] [place] Site: [address] INR POC 2.1 INR RANGE 2 - 3  Dietary changes: no    Health status changes: no    Bleeding/hemorrhagic complications: no    Recent/future hospitalizations: no    Any changes in medication regimen? no    Recent/future dental: no  Any missed doses?: yes     Details: May have missed 1 dose a while back, none recently.    Is patient compliant with meds? yes       Allergies: No Known Drug Allergies  Anticoagulation Management History:      The patient is taking warfarin and comes in today for a routine follow up visit.  Negative risk factors for bleeding include an age less than 49 years old.  The bleeding index is 'low risk'.  Positive CHADS2 values include History of CHF.  Negative CHADS2 values include Age > 73 years old.  The start date was 02/04/2009.  His last INR was 2.1.  Anticoagulation responsible provider: Juanda Chance MD, Smitty Cords.  INR POC: 2.1.  Cuvette Lot#: [id number].  Exp: 07/2011.    Anticoagulation Management Assessment/Plan:      The patient's current anticoagulation dose is Warfarin sodium 2 mg tabs: Use as directed by Anticoagualtion Clinic.  The target INR is 2.0-3.0.  The next INR is due 07/03/2010.  Anticoagulation instructions were given to patient.  Results were reviewed/authorized by Cloyde Reams, RN, BSN.  He was notified by Cloyde Reams RN.         Prior Anticoagulation Instructions: INR 2.4 Continue 2mg s daily except 4mg s on Thursdays. Recheck in 4 weeks.   Current Anticoagulation Instructions: INR 2.1  Continue on same dosage 1 tablet daily except 2 tablets on Thursdays.  Recheck in 4 weeks.

## 2011-01-04 NOTE — Medication Information (Signed)
Summary: rov/sp  Anticoagulant Therapy  Managed by: Cloyde Reams, RN, BSN Referring MD: Tonny Bollman PCP: [facility]ians Supervising MD: Tenny Craw MD, Gunnar Fusi Indication 1: Atrial Fibrillation (ICD-427.31) Lab Used: [facility] [place] Site: [address] INR POC 2.1 INR RANGE 2 - 3  Dietary changes: no    Health status changes: no    Bleeding/hemorrhagic complications: no    Recent/future hospitalizations: no    Any changes in medication regimen? no    Recent/future dental: no  Any missed doses?: no       Is patient compliant with meds? yes       Allergies: No Known Drug Allergies  Anticoagulation Management History:      The patient is taking warfarin and comes in today for a routine follow up visit.  Positive risk factors for bleeding include an age of 66 years or older.  The bleeding index is 'intermediate risk'.  Positive CHADS2 values include History of CHF.  Negative CHADS2 values include Age > 66 years old.  The start date was 02/04/2009.  His last INR was 2.1.  Anticoagulation responsible provider: Tenny Craw MD, Gunnar Fusi.  INR POC: 2.1.  Cuvette Lot#: [id number].  Exp: 12/2011.    Anticoagulation Management Assessment/Plan:      The patient's current anticoagulation dose is Warfarin sodium 2 mg tabs: Use as directed by Anticoagualtion Clinic.  The target INR is 2.0-3.0.  The next INR is due 01/18/2011.  Anticoagulation instructions were given to patient.  Results were reviewed/authorized by Cloyde Reams, RN, BSN.  He was notified by Cloyde Reams RN.         Prior Anticoagulation Instructions: INR 2.3  Continue same dose of 1 tablet every day except 2 tablets on Thursday.  Recheck INR in 4 weeks.   Current Anticoagulation Instructions: INR 2.1  Continue on same dosage 1 tablet daily except 2 tablets on Thursdays.  Recheck in 4 weeks.

## 2011-01-04 NOTE — Medication Information (Signed)
Summary: rov/mw  Anticoagulant Therapy  Managed by: Weston Brass, PharmD Referring MD: Tonny Bollman PCP: [facility] Physcians Supervising MD: Shirlee Latch MD, Freida Busman Indication 1: Atrial Fibrillation (ICD-427.31) Lab Used: [facility]  Site: [address] INR POC 2.3 INR RANGE 2 - 3  Dietary changes: no    Health status changes: no    Bleeding/hemorrhagic complications: no    Recent/future hospitalizations: no    Any changes in medication regimen? no    Recent/future dental: no  Any missed doses?: no       Is patient compliant with meds? yes       Allergies: No Known Drug Allergies  Anticoagulation Management History:      The patient is taking warfarin and comes in today for a routine follow up visit.  Positive risk factors for bleeding include an age of 50 years or older.  The bleeding index is 'intermediate risk'.  Positive CHADS2 values include History of CHF.  Negative CHADS2 values include Age > 7 years old.  The start date was 02/04/2009.  His last INR was 2.1.  Anticoagulation responsible provider: Shirlee Latch MD, [place].  INR POC: 2.3.  Cuvette Lot#: [id number].  Exp: 12/2011.    Anticoagulation Management Assessment/Plan:      The patient's current anticoagulation dose is Warfarin sodium 2 mg tabs: Use as directed by Anticoagualtion Clinic.  The target INR is 2.0-3.0.  The next INR is due 12/21/2010.  Anticoagulation instructions were given to patient.  Results were reviewed/authorized by Weston Brass, PharmD.  He was notified by Weston Brass PharmD.         Prior Anticoagulation Instructions: INR 2.1 Continue taking 2 tablets on thursday. And 1 tablet all other days. Recheck in 4 weeks.  Current Anticoagulation Instructions: INR 2.3  Continue same dose of 1 tablet every day except 2 tablets on Thursday.  Recheck INR in 4 weeks.

## 2011-01-04 NOTE — Assessment & Plan Note (Signed)
Summary: f43m   Visit Type:  Follow-up Primary Provider:  [facility]  CC:  none.  History of Present Illness: This is a 65 year old gentleman with CAD status post CABG, severely reduced LVEF at time of diagnosis which is now recovered, and paroxysmal atrial fibrillation.  He has no complaints today. He specifically denies chest pain, dyspnea, palpitations, or edema. He is not physically active. Has been trying to improve on his diet.  Current Medications (verified): 1)  Lisinopril 20 Mg Tabs (Lisinopril) .... Take One Tablet By Mouth Daily 2)  Potassium Chloride Crys Cr 20 Meq Cr-Tabs (Potassium Chloride Crys Cr) .... Take 1 Tablet By Mouth Once A Day 3)  Lasix 80 Mg Tabs (Furosemide) .... Take 1 Tablet By Mouth Once A Day 4)  Aspirin 81 Mg Tbec (Aspirin) .... Take One Tablet By Mouth Daily 5)  Warfarin Sodium 2 Mg Tabs (Warfarin Sodium) .... Use As Directed By Anticoagualtion Clinic 6)  Carvedilol 25 Mg Tabs (Carvedilol) .... Take One Tablet By Mouth Twice A Day 7)  Simvastatin 40 Mg Tabs (Simvastatin) .... Take One Tablet By Mouth Daily At Bedtime  Allergies (verified): No Known Drug Allergies  Past History:  Past medical history reviewed for relevance to current acute and chronic problems.  Past Medical History: Reviewed history from 07/03/2010 and no changes required. CONGESTIVE HEART FAILURE (ICD-428.0)--Ischemic CAD, ARTERY BYPASS GRAFT (ICD-414.04)--CABGx4 12/24/2008 Dyslipidemia HTN Atrial fibrillation  Review of Systems       Negative except as per HPI   Vital Signs:  Patient profile:   65 year old male Height:      67 inches Weight:      199 pounds BMI:     31.28 Pulse rate:   78 / minute Resp:     16 per minute BP sitting:   130 / 77  (right arm)  Vitals Entered By: Marrion Coy, CNA (November 22, 2010 3:26 PM)  Physical Exam  General:  Pt is alert and oriented, obese male, in no acute distress. HEENT: normal Neck: normal carotid  upstrokes without bruits, JVP normal Lungs: CTA CV: irregularly irregular without murmur or gallop Abd: soft, NT, positive BS, no bruit, no organomegaly, obese Ext: no clubbing, cyanosis, or edema. peripheral pulses 2+ and equal Skin: warm and dry without rash    Impression & Recommendations:  Problem # 1:  SYSTOLIC HEART FAILURE, CHRONIC (ICD-428.22) Stable, LVEF recovered. Pt on appropriate medical therapy as below. Encouraged efforts at regular exercise.  His updated medication list for this problem includes:    Lisinopril 20 Mg Tabs (Lisinopril) .Marland Kitchen... Take one tablet by mouth daily    Lasix 80 Mg Tabs (Furosemide) .Marland Kitchen... Take 1 tablet by mouth once a day    Aspirin 81 Mg Tbec (Aspirin) .Marland Kitchen... Take one tablet by mouth daily    Warfarin Sodium 2 Mg Tabs (Warfarin sodium) ..... Use as directed by anticoagualtion clinic    Carvedilol 25 Mg Tabs (Carvedilol) .Marland Kitchen... Take one tablet by mouth twice a day  Problem # 2:  ATRIAL FIBRILLATION (ICD-427.31) Pt tolerating anticoagulation with warfarin. No signs of CHF on exam today.  His updated medication list for this problem includes:    Aspirin 81 Mg Tbec (Aspirin) .Marland Kitchen... Take one tablet by mouth daily    Warfarin Sodium 2 Mg Tabs (Warfarin sodium) ..... Use as directed by anticoagualtion clinic    Carvedilol 25 Mg Tabs (Carvedilol) .Marland Kitchen... Take one tablet by mouth twice a day  Problem # 3:  CAD, ARTERY  BYPASS GRAFT (ICD-414.04) Stable without angina.  His updated medication list for this problem includes:    Lisinopril 20 Mg Tabs (Lisinopril) .Marland Kitchen... Take one tablet by mouth daily    Aspirin 81 Mg Tbec (Aspirin) .Marland Kitchen... Take one tablet by mouth daily    Warfarin Sodium 2 Mg Tabs (Warfarin sodium) ..... Use as directed by anticoagualtion clinic    Carvedilol 25 Mg Tabs (Carvedilol) .Marland Kitchen... Take one tablet by mouth twice a day  Problem # 4:  HYPERLIPIDEMIA-MIXED (ICD-272.4) LDL has been at goal. Pt needs followup lipids.  His updated medication  list for this problem includes:    Simvastatin 40 Mg Tabs (Simvastatin) .Marland Kitchen... Take one tablet by mouth daily at bedtime  CHOL: 111 (10/04/2009)   LDL: 63 (10/04/2009)   HDL: 35.20 (10/04/2009)   TG: 66.0 (10/04/2009)  Patient Instructions: 1)  Your physician recommends that you return for a FASTING LIPID, BMP and LIVER Profile (427.31, 272.0, 414.02)--Nothing to eat or drink after midnight, lab opens at 8:30.  2)  Your physician recommends that you continue on your current medications as directed. Please refer to the Current Medication list given to you today. 3)  Your physician wants you to follow-up in:   6 MONTHS. You will receive a reminder letter in the mail two months in advance. If you don't receive a letter, please call our office to schedule the follow-up appointment.

## 2011-01-18 ENCOUNTER — Encounter (INDEPENDENT_AMBULATORY_CARE_PROVIDER_SITE_OTHER): Payer: BC Managed Care – PPO

## 2011-01-18 ENCOUNTER — Encounter: Payer: Self-pay | Admitting: Cardiovascular Disease

## 2011-01-18 ENCOUNTER — Other Ambulatory Visit: Payer: Self-pay | Admitting: Cardiovascular Disease

## 2011-01-18 ENCOUNTER — Encounter: Payer: Self-pay | Admitting: Cardiology

## 2011-01-18 ENCOUNTER — Other Ambulatory Visit (INDEPENDENT_AMBULATORY_CARE_PROVIDER_SITE_OTHER): Payer: BC Managed Care – PPO

## 2011-01-18 DIAGNOSIS — I4891 Unspecified atrial fibrillation: Secondary | ICD-10-CM

## 2011-01-18 DIAGNOSIS — Z7901 Long term (current) use of anticoagulants: Secondary | ICD-10-CM

## 2011-01-18 DIAGNOSIS — I25810 Atherosclerosis of coronary artery bypass graft(s) without angina pectoris: Secondary | ICD-10-CM

## 2011-01-18 DIAGNOSIS — I5023 Acute on chronic systolic (congestive) heart failure: Secondary | ICD-10-CM

## 2011-01-18 LAB — BASIC METABOLIC PANEL
BUN: 15 mg/dL (ref 6–23)
Calcium: 9.2 mg/dL (ref 8.4–10.5)
Chloride: 102 mEq/L (ref 96–112)
Creatinine, Ser: 1.2 mg/dL (ref 0.4–1.5)
GFR: 67.05 mL/min (ref >60.00)

## 2011-01-18 LAB — CONVERTED CEMR LAB: POC INR: 2.4

## 2011-01-24 NOTE — Medication Information (Signed)
Summary: [email or web address]  Anticoagulant Therapy  Managed by: Weston Brass, PharmD Referring MD: Tonny Bollman PCP: [facility] Physcians Supervising MD: Myrtis Ser MD, Tinnie Gens Indication 1: Atrial Fibrillation (ICD-427.31) Lab Used: [facility] [place] Site: [address] INR POC 2.4 INR RANGE 2 - 3  Dietary changes: no    Health status changes: no    Bleeding/hemorrhagic complications: no    Recent/future hospitalizations: no    Any changes in medication regimen? yes       Details: Furosemide dose reduced to 1/2 tablet daily  Recent/future dental: no  Any missed doses?: no       Is patient compliant with meds? yes       Allergies: No Known Drug Allergies  Anticoagulation Management History:      The patient is taking warfarin and comes in today for a routine follow up visit.  Positive risk factors for bleeding include an age of 66 years or older.  The bleeding index is 'intermediate risk'.  Positive CHADS2 values include History of CHF.  Negative CHADS2 values include Age > 4 years old.  The start date was 02/04/2009.  His last INR was 2.1.  Anticoagulation responsible provider: Myrtis Ser MD, Tinnie Gens.  INR POC: 2.4.  Cuvette Lot#: [id number].  Exp: 12/2011.    Anticoagulation Management Assessment/Plan:      The patient's current anticoagulation dose is Warfarin sodium 2 mg tabs: Use as directed by Anticoagualtion Clinic.  The target INR is 2.0-3.0.  The next INR is due 02/15/2011.  Anticoagulation instructions were given to patient.  Results were reviewed/authorized by Weston Brass, PharmD.  He was notified by Margot Chimes PharmD Candidate.         Prior Anticoagulation Instructions: INR 2.1  Continue on same dosage 1 tablet daily except 2 tablets on Thursdays.  Recheck in 4 weeks.    Current Anticoagulation Instructions: INR 2.4  Continue to take 1 tablet everyday except on Thursdays when you take 2 tablets.  Recheck INR in 2 weeks.

## 2011-01-30 ENCOUNTER — Encounter: Payer: Self-pay | Admitting: Cardiology

## 2011-01-30 DIAGNOSIS — I4891 Unspecified atrial fibrillation: Secondary | ICD-10-CM

## 2011-02-15 ENCOUNTER — Encounter (INDEPENDENT_AMBULATORY_CARE_PROVIDER_SITE_OTHER): Payer: BC Managed Care – PPO

## 2011-02-15 ENCOUNTER — Encounter: Payer: Self-pay | Admitting: Internal Medicine

## 2011-02-15 DIAGNOSIS — Z7901 Long term (current) use of anticoagulants: Secondary | ICD-10-CM

## 2011-02-15 DIAGNOSIS — I4891 Unspecified atrial fibrillation: Secondary | ICD-10-CM

## 2011-02-15 LAB — CONVERTED CEMR LAB: POC INR: 2.4

## 2011-02-16 ENCOUNTER — Telehealth: Payer: Self-pay | Admitting: Cardiovascular Disease

## 2011-02-20 NOTE — Medication Information (Signed)
Summary: rov/sp  Anticoagulant Therapy  Managed by: Windell Hummingbird, RN Referring MD: Tonny Bollman PCP: [facility] Physcians Supervising MD: Tenny Craw MD, Gunnar Fusi Indication 1: Atrial Fibrillation (ICD-427.31) Lab Used: [facility] [place] Site: [address] INR POC 2.4 INR RANGE 2 - 3  Dietary changes: yes       Details: has had a few more salads  Health status changes: no    Bleeding/hemorrhagic complications: no    Recent/future hospitalizations: no    Any changes in medication regimen? no    Recent/future dental: no  Any missed doses?: no       Is patient compliant with meds? yes       Allergies: No Known Drug Allergies  Anticoagulation Management History:      The patient is taking warfarin and comes in today for a routine follow up visit.  Positive risk factors for bleeding include an age of 66 years or older.  The bleeding index is 'intermediate risk'.  Positive CHADS2 values include History of CHF.  Negative CHADS2 values include Age > 71 years old.  The start date was 02/04/2009.  His last INR was 2.1.  Anticoagulation responsible provider: Tenny Craw MD, Gunnar Fusi.  INR POC: 2.4.  Cuvette Lot#: [id number].  Exp: 02/2012.    Anticoagulation Management Assessment/Plan:      The patient's current anticoagulation dose is Warfarin sodium 2 mg tabs: Use as directed by Anticoagualtion Clinic.  The target INR is 2.0-3.0.  The next INR is due 03/19/2011.  Anticoagulation instructions were given to patient.  Results were reviewed/authorized by Windell Hummingbird, RN.  He was notified by Windell Hummingbird, RN.         Prior Anticoagulation Instructions: INR 2.4  Continue to take 1 tablet everyday except on Thursdays when you take 2 tablets.  Recheck INR in 2 weeks.   Current Anticoagulation Instructions: INR 2.4 Continue taking 1 tablet every day, except take 2 tablets on Thursdays. Recheck in 4 weeks.

## 2011-02-23 ENCOUNTER — Telehealth: Payer: Self-pay | Admitting: Cardiovascular Disease

## 2011-02-23 ENCOUNTER — Other Ambulatory Visit: Payer: Self-pay | Admitting: Cardiovascular Disease

## 2011-02-23 NOTE — Telephone Encounter (Signed)
Called patient's wife back-informed that patient's potassium was 5.1 in February so,per Dr. Excell Seltzer, needs to discontinue Potassium and have labs rechecked in 6 months.

## 2011-02-28 ENCOUNTER — Telehealth: Payer: Self-pay | Admitting: Cardiovascular Disease

## 2011-02-28 NOTE — Telephone Encounter (Signed)
Spoke with Selena Batten at the [facility] in [place] and she advised me that the Carvedilol 25mg  is on back order right now but she has the 12.5mg  in stock. She will fill the script for 12.5mg   (2 tablets) two times per day with 1 year refills. I spoke with the patient's wife and she will pick up his medication today.

## 2011-03-01 NOTE — Telephone Encounter (Signed)
[address] °

## 2011-03-01 NOTE — Progress Notes (Addendum)
Summary: Adjust medications  Phone Note Outgoing Call   Call placed by: Julieta Gutting, RN, BSN,  February 16, 2011 6:38 PM Call placed to: Patient Summary of Call: Reviewed results of 01/18/11 labs and noticed that the pt's potassium was 5.1.  This pt is taking furosemide, potassium and lisinopril.  Discussed with Dr Excell Seltzer and he recommended that the pt stop potassium and could recheck labs in 6 months.  Left message on home number to call the office. Julieta Gutting, RN, BSN  February 16, 2011 6:40 PM  Follow-up for Phone Call        Called patient-spoke with wife.  Given instructions to stop potassium and have his labs rechecked in 6 months.  Wife to tell patient when he returns from work. Follow-up by: Dessie Coma  LPN,  February 23, 2011 1:06 PM  Additional Follow-up for Phone Call Additional follow up Details #1::        agree with plan Additional Follow-up by: Norva Karvonen, MD,  February 24, 2011 11:03 PM

## 2011-03-15 LAB — BASIC METABOLIC PANEL
BUN: 10 mg/dL (ref 6–23)
BUN: 13 mg/dL (ref 6–23)
BUN: 13 mg/dL (ref 6–23)
BUN: 18 mg/dL (ref 6–23)
CO2: 26 mEq/L (ref 19–32)
CO2: 26 mEq/L (ref 19–32)
CO2: 30 mEq/L (ref 19–32)
CO2: 35 mEq/L — ABNORMAL HIGH (ref 19–32)
Calcium: 8.9 mg/dL (ref 8.4–10.5)
Calcium: 9 mg/dL (ref 8.4–10.5)
Calcium: 9 mg/dL (ref 8.4–10.5)
Calcium: 9.2 mg/dL (ref 8.4–10.5)
Calcium: 9.3 mg/dL (ref 8.4–10.5)
Chloride: 95 mEq/L — ABNORMAL LOW (ref 96–112)
Creatinine, Ser: 0.91 mg/dL (ref 0.4–1.5)
Creatinine, Ser: 0.95 mg/dL (ref 0.4–1.5)
Creatinine, Ser: 0.97 mg/dL (ref 0.4–1.5)
Creatinine, Ser: 1.04 mg/dL (ref 0.4–1.5)
GFR calc Af Amer: >60 mL/min (ref >60)
GFR calc Af Amer: >60 mL/min (ref >60)
GFR calc Af Amer: >60 mL/min (ref >60)
GFR calc non Af Amer: >60 mL/min (ref >60)
GFR calc non Af Amer: >60 mL/min (ref >60)
GFR calc non Af Amer: >60 mL/min (ref >60)
GFR calc non Af Amer: >60 mL/min (ref >60)
Glucose, Bld: 100 mg/dL — ABNORMAL HIGH (ref 70–99)
Glucose, Bld: 103 mg/dL — ABNORMAL HIGH (ref 70–99)
Glucose, Bld: 119 mg/dL — ABNORMAL HIGH (ref 70–99)
Glucose, Bld: 79 mg/dL (ref 70–99)
Glucose, Bld: 84 mg/dL (ref 70–99)
Glucose, Bld: 87 mg/dL (ref 70–99)
Potassium: 3.2 mEq/L — ABNORMAL LOW (ref 3.5–5.1)
Potassium: 3.4 mEq/L — ABNORMAL LOW (ref 3.5–5.1)
Potassium: 4.2 mEq/L (ref 3.5–5.1)
Sodium: 133 mEq/L — ABNORMAL LOW (ref 135–145)
Sodium: 134 mEq/L — ABNORMAL LOW (ref 135–145)
Sodium: 135 mEq/L (ref 135–145)
Sodium: 136 mEq/L (ref 135–145)

## 2011-03-15 LAB — CBC
HCT: 34.6 % — ABNORMAL LOW (ref 39.0–52.0)
HCT: 35.6 % — ABNORMAL LOW (ref 39.0–52.0)
HCT: 36.2 % — ABNORMAL LOW (ref 39.0–52.0)
HCT: 37.4 % — ABNORMAL LOW (ref 39.0–52.0)
HCT: 37.7 % — ABNORMAL LOW (ref 39.0–52.0)
HCT: 45.9 % (ref 39.0–52.0)
Hemoglobin: 11.7 g/dL — ABNORMAL LOW (ref 13.0–17.0)
Hemoglobin: 12.6 g/dL — ABNORMAL LOW (ref 13.0–17.0)
Hemoglobin: 12.6 g/dL — ABNORMAL LOW (ref 13.0–17.0)
Hemoglobin: 12.6 g/dL — ABNORMAL LOW (ref 13.0–17.0)
Hemoglobin: 12.7 g/dL — ABNORMAL LOW (ref 13.0–17.0)
Hemoglobin: 13.1 g/dL (ref 13.0–17.0)
Hemoglobin: 15.8 g/dL (ref 13.0–17.0)
MCHC: 34.1 g/dL (ref 30.0–36.0)
MCHC: 34.5 g/dL (ref 30.0–36.0)
MCHC: 34.7 g/dL (ref 30.0–36.0)
MCHC: 34.7 g/dL (ref 30.0–36.0)
MCHC: 34.9 g/dL (ref 30.0–36.0)
MCHC: 35.2 g/dL (ref 30.0–36.0)
MCHC: 35.3 g/dL (ref 30.0–36.0)
MCHC: 35.7 g/dL (ref 30.0–36.0)
MCV: 88.3 fL (ref 78.0–100.0)
MCV: 88.8 fL (ref 78.0–100.0)
MCV: 89 fL (ref 78.0–100.0)
MCV: 89.4 fL (ref 78.0–100.0)
MCV: 89.7 fL (ref 78.0–100.0)
MCV: 89.9 fL (ref 78.0–100.0)
MCV: 90.1 fL (ref 78.0–100.0)
Platelets: 227 10*3/uL (ref 150–400)
Platelets: 245 10*3/uL (ref 150–400)
Platelets: 292 10*3/uL (ref 150–400)
RBC: 3.78 MIL/uL — ABNORMAL LOW (ref 4.22–5.81)
RBC: 3.9 MIL/uL — ABNORMAL LOW (ref 4.22–5.81)
RBC: 4.03 MIL/uL — ABNORMAL LOW (ref 4.22–5.81)
RBC: 4.1 MIL/uL — ABNORMAL LOW (ref 4.22–5.81)
RBC: 4.16 MIL/uL — ABNORMAL LOW (ref 4.22–5.81)
RDW: 14.6 % (ref 11.5–15.5)
RDW: 14.6 % (ref 11.5–15.5)
RDW: 14.7 % (ref 11.5–15.5)
RDW: 15 % (ref 11.5–15.5)
RDW: 15.1 % (ref 11.5–15.5)
WBC: 10 10*3/uL (ref 4.0–10.5)
WBC: 7.2 10*3/uL (ref 4.0–10.5)
WBC: 7.5 10*3/uL (ref 4.0–10.5)
WBC: 7.8 10*3/uL (ref 4.0–10.5)
WBC: 8.4 10*3/uL (ref 4.0–10.5)

## 2011-03-15 LAB — DIFFERENTIAL
Basophils Absolute: 0 10*3/uL (ref 0.0–0.1)
Basophils Relative: 0 % (ref 0–1)
Basophils Relative: 0 % (ref 0–1)
Eosinophils Absolute: 0 10*3/uL (ref 0.0–0.7)
Eosinophils Relative: 0 % (ref 0–5)
Lymphocytes Relative: 8 % — ABNORMAL LOW (ref 12–46)
Lymphs Abs: 0.7 10*3/uL (ref 0.7–4.0)
Monocytes Absolute: 0.1 10*3/uL (ref 0.1–1.0)
Neutro Abs: 9.2 10*3/uL — ABNORMAL HIGH (ref 1.7–7.7)
Neutrophils Relative %: 86 % — ABNORMAL HIGH (ref 43–77)
Neutrophils Relative %: 91 % — ABNORMAL HIGH (ref 43–77)

## 2011-03-15 LAB — COMPREHENSIVE METABOLIC PANEL
Albumin: 4.3 g/dL (ref 3.5–5.2)
Alkaline Phosphatase: 103 U/L (ref 39–117)
BUN: 16 mg/dL (ref 6–23)
Chloride: 93 mEq/L — ABNORMAL LOW (ref 96–112)
Creatinine, Ser: 1.22 mg/dL (ref 0.4–1.5)
GFR calc non Af Amer: 60 mL/min — ABNORMAL LOW (ref >60)
Glucose, Bld: 148 mg/dL — ABNORMAL HIGH (ref 70–99)
Total Bilirubin: 1.6 mg/dL — ABNORMAL HIGH (ref 0.3–1.2)

## 2011-03-15 LAB — APTT: aPTT: 33 seconds (ref 24–37)

## 2011-03-15 LAB — CARDIAC PANEL(CRET KIN+CKTOT+MB+TROPI)
CK, MB: 0.7 ng/mL (ref 0.3–4.0)
Total CK: 21 U/L (ref 7–232)
Troponin I: 0.03 ng/mL (ref 0.00–0.06)

## 2011-03-15 LAB — URINALYSIS, ROUTINE W REFLEX MICROSCOPIC
Ketones, ur: 15 mg/dL — AB
Protein, ur: NEGATIVE mg/dL
Urobilinogen, UA: 1 mg/dL (ref 0.0–1.0)

## 2011-03-15 LAB — PROTIME-INR
INR: 1 (ref 0.00–1.49)
INR: 1.2 (ref 0.00–1.49)
INR: 2.2 — ABNORMAL HIGH (ref 0.00–1.49)
Prothrombin Time: 13.9 seconds (ref 11.6–15.2)
Prothrombin Time: 16 seconds — ABNORMAL HIGH (ref 11.6–15.2)
Prothrombin Time: 26 seconds — ABNORMAL HIGH (ref 11.6–15.2)
Prothrombin Time: 33.4 seconds — ABNORMAL HIGH (ref 11.6–15.2)

## 2011-03-15 LAB — HEPARIN LEVEL (UNFRACTIONATED)
Heparin Unfractionated: 0.34 IU/mL (ref 0.30–0.70)
Heparin Unfractionated: 0.43 IU/mL (ref 0.30–0.70)
Heparin Unfractionated: 0.53 IU/mL (ref 0.30–0.70)
Heparin Unfractionated: <0.1 IU/mL — ABNORMAL LOW (ref 0.30–0.70)
Heparin Unfractionated: <0.1 IU/mL — ABNORMAL LOW (ref 0.30–0.70)

## 2011-03-15 LAB — LIPASE, BLOOD: Lipase: 16 U/L (ref 11–59)

## 2011-03-15 LAB — BRAIN NATRIURETIC PEPTIDE: Pro B Natriuretic peptide (BNP): 174 pg/mL — ABNORMAL HIGH (ref 0.0–100.0)

## 2011-03-19 ENCOUNTER — Ambulatory Visit (INDEPENDENT_AMBULATORY_CARE_PROVIDER_SITE_OTHER): Payer: BC Managed Care – PPO | Admitting: *Deleted

## 2011-03-19 DIAGNOSIS — I4891 Unspecified atrial fibrillation: Secondary | ICD-10-CM

## 2011-03-19 LAB — CBC
HCT: 37.5 % — ABNORMAL LOW (ref 39.0–52.0)
HCT: 38.8 % — ABNORMAL LOW (ref 39.0–52.0)
HCT: 40.1 % (ref 39.0–52.0)
HCT: 43.3 % (ref 39.0–52.0)
HCT: 44.3 % (ref 39.0–52.0)
HCT: 44.6 % (ref 39.0–52.0)
Hemoglobin: 12 g/dL — ABNORMAL LOW (ref 13.0–17.0)
Hemoglobin: 12.3 g/dL — ABNORMAL LOW (ref 13.0–17.0)
Hemoglobin: 12.6 g/dL — ABNORMAL LOW (ref 13.0–17.0)
Hemoglobin: 13 g/dL (ref 13.0–17.0)
Hemoglobin: 14.4 g/dL (ref 13.0–17.0)
Hemoglobin: 14.7 g/dL (ref 13.0–17.0)
Hemoglobin: 15.5 g/dL (ref 13.0–17.0)
MCHC: 32.5 g/dL (ref 30.0–36.0)
MCHC: 32.5 g/dL (ref 30.0–36.0)
MCHC: 32.8 g/dL (ref 30.0–36.0)
MCHC: 33.1 g/dL (ref 30.0–36.0)
MCHC: 33.5 g/dL (ref 30.0–36.0)
MCV: 91.5 fL (ref 78.0–100.0)
MCV: 92.1 fL (ref 78.0–100.0)
MCV: 92.5 fL (ref 78.0–100.0)
Platelets: 153 10*3/uL (ref 150–400)
Platelets: 189 10*3/uL (ref 150–400)
Platelets: 192 10*3/uL (ref 150–400)
Platelets: 196 10*3/uL (ref 150–400)
Platelets: 211 10*3/uL (ref 150–400)
RBC: 3.98 MIL/uL — ABNORMAL LOW (ref 4.22–5.81)
RBC: 4.01 MIL/uL — ABNORMAL LOW (ref 4.22–5.81)
RBC: 4.12 MIL/uL — ABNORMAL LOW (ref 4.22–5.81)
RBC: 4.22 MIL/uL (ref 4.22–5.81)
RBC: 4.84 MIL/uL (ref 4.22–5.81)
RBC: 5.15 MIL/uL (ref 4.22–5.81)
RDW: 14.7 % (ref 11.5–15.5)
RDW: 14.9 % (ref 11.5–15.5)
RDW: 14.9 % (ref 11.5–15.5)
RDW: 15 % (ref 11.5–15.5)
RDW: 15.1 % (ref 11.5–15.5)
WBC: 10.8 10*3/uL — ABNORMAL HIGH (ref 4.0–10.5)
WBC: 12 10*3/uL — ABNORMAL HIGH (ref 4.0–10.5)
WBC: 13.6 10*3/uL — ABNORMAL HIGH (ref 4.0–10.5)
WBC: 6.3 10*3/uL (ref 4.0–10.5)
WBC: 6.7 10*3/uL (ref 4.0–10.5)

## 2011-03-19 LAB — BASIC METABOLIC PANEL
BUN: 7 mg/dL (ref 6–23)
CO2: 25 mEq/L (ref 19–32)
CO2: 28 mEq/L (ref 19–32)
CO2: 28 mEq/L (ref 19–32)
Calcium: 7.9 mg/dL — ABNORMAL LOW (ref 8.4–10.5)
Calcium: 8.2 mg/dL — ABNORMAL LOW (ref 8.4–10.5)
Calcium: 8.7 mg/dL (ref 8.4–10.5)
Chloride: 96 mEq/L (ref 96–112)
Chloride: 98 mEq/L (ref 96–112)
Creatinine, Ser: 0.59 mg/dL (ref 0.4–1.5)
Creatinine, Ser: 0.94 mg/dL (ref 0.4–1.5)
Creatinine, Ser: 1.6 mg/dL — ABNORMAL HIGH (ref 0.4–1.5)
GFR calc Af Amer: 53 mL/min — ABNORMAL LOW (ref >60)
GFR calc Af Amer: >60 mL/min (ref >60)
GFR calc Af Amer: >60 mL/min (ref >60)
GFR calc non Af Amer: 44 mL/min — ABNORMAL LOW (ref >60)
GFR calc non Af Amer: >60 mL/min (ref >60)
Glucose, Bld: 100 mg/dL — ABNORMAL HIGH (ref 70–99)
Glucose, Bld: 111 mg/dL — ABNORMAL HIGH (ref 70–99)
Potassium: 3.5 mEq/L (ref 3.5–5.1)
Potassium: 4.7 mEq/L (ref 3.5–5.1)
Sodium: 126 mEq/L — ABNORMAL LOW (ref 135–145)
Sodium: 133 mEq/L — ABNORMAL LOW (ref 135–145)
Sodium: 135 mEq/L (ref 135–145)

## 2011-03-19 LAB — URINALYSIS, ROUTINE W REFLEX MICROSCOPIC
Bilirubin Urine: NEGATIVE
Glucose, UA: NEGATIVE mg/dL
Hgb urine dipstick: NEGATIVE
Ketones, ur: NEGATIVE mg/dL
Specific Gravity, Urine: 1.009 (ref 1.005–1.030)
pH: 6 (ref 5.0–8.0)

## 2011-03-19 LAB — CARDIAC PANEL(CRET KIN+CKTOT+MB+TROPI)
CK, MB: 3 ng/mL (ref 0.3–4.0)
Relative Index: INVALID (ref 0.0–2.5)
Troponin I: 0.05 ng/mL (ref 0.00–0.06)
Troponin I: 0.05 ng/mL (ref 0.00–0.06)

## 2011-03-19 LAB — GLUCOSE, CAPILLARY
Glucose-Capillary: 103 mg/dL — ABNORMAL HIGH (ref 70–99)
Glucose-Capillary: 105 mg/dL — ABNORMAL HIGH (ref 70–99)
Glucose-Capillary: 108 mg/dL — ABNORMAL HIGH (ref 70–99)
Glucose-Capillary: 117 mg/dL — ABNORMAL HIGH (ref 70–99)
Glucose-Capillary: 140 mg/dL — ABNORMAL HIGH (ref 70–99)
Glucose-Capillary: 152 mg/dL — ABNORMAL HIGH (ref 70–99)
Glucose-Capillary: 172 mg/dL — ABNORMAL HIGH (ref 70–99)
Glucose-Capillary: 83 mg/dL (ref 70–99)
Glucose-Capillary: 93 mg/dL (ref 70–99)

## 2011-03-19 LAB — POCT I-STAT 4, (NA,K, GLUC, HGB,HCT)
Glucose, Bld: 84 mg/dL (ref 70–99)
HCT: 26 % — ABNORMAL LOW (ref 39.0–52.0)
HCT: 46 % (ref 39.0–52.0)
Hemoglobin: 14.3 g/dL (ref 13.0–17.0)
Hemoglobin: 8.8 g/dL — ABNORMAL LOW (ref 13.0–17.0)
Hemoglobin: 9.9 g/dL — ABNORMAL LOW (ref 13.0–17.0)
Potassium: 3.5 mEq/L (ref 3.5–5.1)
Potassium: 4.2 mEq/L (ref 3.5–5.1)
Potassium: 4.6 mEq/L (ref 3.5–5.1)
Sodium: 133 mEq/L — ABNORMAL LOW (ref 135–145)
Sodium: 133 mEq/L — ABNORMAL LOW (ref 135–145)
Sodium: 134 mEq/L — ABNORMAL LOW (ref 135–145)

## 2011-03-19 LAB — POCT I-STAT 3, ART BLOOD GAS (G3+)
Bicarbonate: 23.6 mEq/L (ref 20.0–24.0)
Bicarbonate: 24.2 mEq/L — ABNORMAL HIGH (ref 20.0–24.0)
Bicarbonate: 24.8 mEq/L — ABNORMAL HIGH (ref 20.0–24.0)
O2 Saturation: 93 %
Patient temperature: 36.5
Patient temperature: 37.3
TCO2: 25 mmol/L (ref 0–100)
TCO2: 26 mmol/L (ref 0–100)
pCO2 arterial: 33.7 mmHg — ABNORMAL LOW (ref 35.0–45.0)
pCO2 arterial: 40.6 mmHg (ref 35.0–45.0)
pCO2 arterial: 41.1 mmHg (ref 35.0–45.0)
pH, Arterial: 7.374 (ref 7.350–7.450)
pH, Arterial: 7.456 — ABNORMAL HIGH (ref 7.350–7.450)
pH, Arterial: 7.475 — ABNORMAL HIGH (ref 7.350–7.450)
pO2, Arterial: 103 mmHg — ABNORMAL HIGH (ref 80.0–100.0)
pO2, Arterial: 274 mmHg — ABNORMAL HIGH (ref 80.0–100.0)

## 2011-03-19 LAB — COMPREHENSIVE METABOLIC PANEL
ALT: 18 U/L (ref 0–53)
AST: 21 U/L (ref 0–37)
AST: 30 U/L (ref 0–37)
Albumin: 2.9 g/dL — ABNORMAL LOW (ref 3.5–5.2)
Alkaline Phosphatase: 58 U/L (ref 39–117)
BUN: 11 mg/dL (ref 6–23)
CO2: 27 mEq/L (ref 19–32)
CO2: 29 mEq/L (ref 19–32)
Calcium: 9.2 mg/dL (ref 8.4–10.5)
Chloride: 98 mEq/L (ref 96–112)
Chloride: 99 mEq/L (ref 96–112)
Creatinine, Ser: 0.99 mg/dL (ref 0.4–1.5)
GFR calc non Af Amer: >60 mL/min (ref >60)
Glucose, Bld: 110 mg/dL — ABNORMAL HIGH (ref 70–99)
Potassium: 3.4 mEq/L — ABNORMAL LOW (ref 3.5–5.1)
Total Bilirubin: 1.6 mg/dL — ABNORMAL HIGH (ref 0.3–1.2)
Total Bilirubin: 1.6 mg/dL — ABNORMAL HIGH (ref 0.3–1.2)

## 2011-03-19 LAB — DIFFERENTIAL
Basophils Absolute: 0 10*3/uL (ref 0.0–0.1)
Eosinophils Relative: 1 % (ref 0–5)
Lymphocytes Relative: 17 % (ref 12–46)
Lymphs Abs: 1.1 10*3/uL (ref 0.7–4.0)
Neutro Abs: 5 10*3/uL (ref 1.7–7.7)

## 2011-03-19 LAB — BLOOD GAS, ARTERIAL
Acid-base deficit: 4.1 mmol/L — ABNORMAL HIGH (ref 0.0–2.0)
FIO2: 1 %
MECHVT: 0.7 mL
O2 Saturation: 98.4 %
Patient temperature: 98.6
RATE: 12 resp/min
pO2, Arterial: 114 mmHg — ABNORMAL HIGH (ref 80.0–100.0)

## 2011-03-19 LAB — CK TOTAL AND CKMB (NOT AT ARMC)
CK, MB: 3.2 ng/mL (ref 0.3–4.0)
Relative Index: INVALID (ref 0.0–2.5)
Total CK: 80 U/L (ref 7–232)

## 2011-03-19 LAB — URINE CULTURE: Colony Count: NO GROWTH

## 2011-03-19 LAB — POCT I-STAT, CHEM 8
Chloride: 102 mEq/L (ref 96–112)
HCT: 38 % — ABNORMAL LOW (ref 39.0–52.0)
Hemoglobin: 12.9 g/dL — ABNORMAL LOW (ref 13.0–17.0)
Potassium: 4.5 mEq/L (ref 3.5–5.1)
Sodium: 135 mEq/L (ref 135–145)

## 2011-03-19 LAB — HEMOGLOBIN AND HEMATOCRIT, BLOOD: HCT: 25.9 % — ABNORMAL LOW (ref 39.0–52.0)

## 2011-03-19 LAB — CREATININE, SERUM
Creatinine, Ser: 0.97 mg/dL (ref 0.4–1.5)
GFR calc non Af Amer: >60 mL/min (ref >60)

## 2011-03-19 LAB — POCT I-STAT 3, VENOUS BLOOD GAS (G3P V)
Bicarbonate: 24.9 mEq/L — ABNORMAL HIGH (ref 20.0–24.0)
TCO2: 26 mmol/L (ref 0–100)

## 2011-03-19 LAB — POCT CARDIAC MARKERS
CKMB, poc: 1.3 ng/mL (ref 1.0–8.0)
Myoglobin, poc: 99.1 ng/mL (ref 12–200)
Troponin i, poc: <0.05 ng/mL (ref 0.00–0.09)

## 2011-03-19 LAB — LIPID PANEL
Cholesterol: 102 mg/dL (ref 0–200)
Cholesterol: 119 mg/dL (ref 0–200)
HDL: 38 mg/dL — ABNORMAL LOW (ref >39)
LDL Cholesterol: 72 mg/dL (ref 0–99)
Triglycerides: 39 mg/dL (ref <150)
Triglycerides: 43 mg/dL (ref <150)

## 2011-03-19 LAB — MAGNESIUM
Magnesium: 2.4 mg/dL (ref 1.5–2.5)
Magnesium: 2.6 mg/dL — ABNORMAL HIGH (ref 1.5–2.5)

## 2011-03-19 LAB — HEMOGLOBIN A1C: Hgb A1c MFr Bld: 5.6 % (ref 4.6–6.1)

## 2011-03-19 LAB — HEPARIN LEVEL (UNFRACTIONATED): Heparin Unfractionated: 0.34 IU/mL (ref 0.30–0.70)

## 2011-03-19 LAB — TROPONIN I: Troponin I: 0.06 ng/mL (ref 0.00–0.06)

## 2011-03-19 LAB — APTT: aPTT: 43 seconds — ABNORMAL HIGH (ref 24–37)

## 2011-03-19 LAB — D-DIMER, QUANTITATIVE: D-Dimer, Quant: 2.65 ug/mL-FEU — ABNORMAL HIGH (ref 0.00–0.48)

## 2011-03-19 LAB — POCT I-STAT GLUCOSE: Operator id: 125961

## 2011-03-19 LAB — PLATELET COUNT: Platelets: 132 10*3/uL — ABNORMAL LOW (ref 150–400)

## 2011-03-19 LAB — TSH: TSH: 2.612 u[IU]/mL (ref 0.350–4.500)

## 2011-04-13 ENCOUNTER — Other Ambulatory Visit: Payer: Self-pay | Admitting: Cardiovascular Disease

## 2011-04-13 NOTE — Telephone Encounter (Signed)
Coumadin 

## 2011-04-13 NOTE — Telephone Encounter (Signed)
Refill request

## 2011-04-16 ENCOUNTER — Ambulatory Visit (INDEPENDENT_AMBULATORY_CARE_PROVIDER_SITE_OTHER): Payer: BC Managed Care – PPO | Admitting: *Deleted

## 2011-04-16 DIAGNOSIS — I4891 Unspecified atrial fibrillation: Secondary | ICD-10-CM

## 2011-04-17 NOTE — Consult Note (Signed)
Alejandro [name], [name] NO.:  [id number]   MEDICAL RECORD NO.:  [id number]          PATIENT TYPE:  INP   LOCATION:  2916                         FACILITY:  [facility]   PHYSICIAN:  Alejandro [name], M.D.     DATE OF BIRTH:  June 24, 1946   DATE OF CONSULTATION:  12/23/2008  DATE OF DISCHARGE:                                 CONSULTATION   REFERRING PHYSICIAN:  Veverly Fells. Excell Seltzer, MD   REASON FOR CONSULTATION:  High-grade left main and severe three-vessel  coronary artery disease with severe left ventricular dysfunction.   CLINICAL HISTORY:  Assessed by Dr. Excell [name] to evaluate Alejandro [name] for  consideration of coronary artery bypass graft surgery.  He is a 65-year-  old gentleman with a history of smoking who reports having about 2- to 3-  week history of shortness of breath with exertion as well as some  orthopnea and PND.  Over the past week, he has developed lower extremity  edema.  He reports having intermittent chest pain with exertion over the  past month associated shortness of breath that was always relieved with  rest.  He presented to emergency room earlier today due to his leg  swelling.  His initial BNP was 1801.  His initial troponin was 0.05 with  a CPK-MB of 1.3.  His electrocardiogram showed normal sinus rhythm with  left ventricular hypertrophy with early repolarization.  There are  nonspecific T-wave changes.  Chest x-ray reportedly showed right pleural  effusion and congestive heart failure.  He was taken to cath lab this  afternoon by Dr. Excell [name] and this showed a 95+% distal left main  bifurcation stenosis.  The LAD was a moderate-sized vessel that did not  reach the apex.  There was diagonal branch that was about the same size  of the LAD.  The left circumflex gave off a single large marginal  branch.  The right coronary artery was diffusely irregular, but widely  patent.  There is occlusion of the posterior descending branch  proximally with faint filling of  distal vessel by collaterals from the  left.  There is a large posterolateral branch that had no stenosis.  Left ventricular ejection fraction was about 25% with global  hypokinesis.  There is no mitral regurgitation.  He had increased left  ventricular end-diastolic pressure.   REVIEW OF SYSTEMS:  GENERAL:  He denies any fever or chills.  He has had  no recent change in his appetite.  He has had weight gain due to fluid  retention.  He reports fatigue for several months.  EYES:  Negative.  ENT:  Negative.  ENDOCRINE:  He denies diabetes and hypothyroidism.  CARDIOVASCULAR:  As above.  He has had no palpitations.  RESPIRATORY:  He reports some cough, but no sputum production.  He has had no  hemoptysis.  GI:  He has had no nausea or vomiting.  Denies melena and  bright red blood per rectum.  He does have a history of GI bleed due to  peptic ulcer in the past.  GU:  Denies dysuria and hematuria.  MUSCULOSKELETAL:  Denies arthralgias and myalgias.  NEUROLOGIC:  Denies  any focal weakness or numbness.  Denies dizziness and syncope.  He has  never had TIA or stroke.  ALLERGIES:  None.  PSYCHIATRIC:  Negative.  HEMATOLOGIC:  Negative.   MEDICATIONS:  Aspirin daily.   PAST MEDICAL HISTORY:  Significant for history of upper GI bleed, which  he said was due to a peptic ulcer.  He has had no recurrence.   SOCIAL HISTORY:  He lives in [place] with his wife.  He works for  [employer].  He is a smoker who said he quit a couple  days prior to admission.  He had been smoking one-half to one pack of  cigarettes for his entire adult life.  He drinks about 3 beers per day.   FAMILY HISTORY:  His mother and father both had heart disease and  diabetes.  He has a brother who had bypass surgery at age 42 recently.   PHYSICAL EXAMINATION:  VITAL SIGNS:  He is afebrile.  Blood pressure is  145/85.  Pulse is 80 and regular.  Respiratory rate is 16 and unlabored.  GENERAL:  He is a  well-developed white male in no distress.  HEENT:  Normocephalic and atraumatic.  Pupils are equal and reactive to  light and accommodation.  Extraocular muscles are intact.  His throat is  clear.  NECK:  Normal carotid pulses bilaterally.  There are no bruits.  There  is no adenopathy or thyromegaly.  CARDIAC:  Regular rate and rhythm with normal S1 and S2.  There is no  murmur, rub, or gallop.  LUNGS:  Clear.  ABDOMEN:  Active bowel sounds.  ABDOMEN:  Soft and nontender.  There are no palpable masses or  organomegaly.  EXTREMITIES:  Moderate peripheral edema to the knees bilaterally.  There  are healed excoriations on both lower legs where he said he was  scratching himself due to a rash from his long johns.  He has strongly  palpable dorsalis pedis and posterior tibial pulses bilaterally.  NEUROLOGIC:  Alert and oriented x3.  Motor and sensory exams grossly  normal.  SKIN:  Warm and dry.   LABORATORY DATA:  Laboratory examination reveals normal electrolytes  with BUN of 7 and creatinine 0.94.  His TSH was 2.612, which was within  normal limits.  His white blood cell count was 6.3, hemoglobin 14.6, and  platelet count of 192,000.  His hemoglobin A1c was 5.6 on admission.  Lipid profile showed cholesterol 119 with HDL 38, LDL 72, and  triglyceride level of 43.  His coagulation profile is within normal  limits.  Urinalysis was negative.   IMPRESSION:  Alejandro [name] has high-grade distal left main and three-vessel  coronary artery disease with severe left ventricular dysfunction  presenting with symptoms of exertional angina and worsening of  congestive heart failure.  I agree that coronary artery bypass graft  surgery is the best treatment to prevent further ischemia, infarction,  and death.  I would expect that his ejection fraction should improve  with revascularization and his congestive heart failure symptoms should  resolve.  I think we should proceed with surgery tomorrow morning  given  the severe distal left main stenosis.  I discussed the operative  procedure of coronary artery bypass surgery with him and his wife and  daughter.  We discussed alternatives, benefits, and risks including but  not limited to bleeding, blood transfusion, infection, stroke,  myocardial infarction, graft failure,  and death.  He understands and  agrees to proceed.      Alejandro [name], M.D.  Electronically Signed     BB/MEDQ  D:  12/23/2008  T:  12/24/2008  Job:  [id number]   cc:   Alejandro Fells. Excell Seltzer, MD

## 2011-04-17 NOTE — Assessment & Plan Note (Signed)
OFFICE VISIT   Alejandro [name], Alejandro [name]  DOB:  [birth year]                                        February 01, 2009  CHART #:  [id number]   The patient returned today for followup status post coronary artery  bypass surgery x4 on December 24, 2008.  I saw him on January 25, 2009,  at which time, he was reporting of some shortness of breath and  orthopnea and had moderate bilateral lower extremity edema.  Chest x-ray  at that time showed moderate bilateral pleural effusions that have  increased from his previous discharge x-ray.  I started him on Lasix 80  mg b.i.d. x7 days and potassium chloride 40 mEq daily x7 days and had  him returned this week for followup.  He did see Dr. Tonny Bollman on  01/27/2009 and his Coreg was increased to 12.5 mg b.i.d. and digoxin was  added at 0.25 mg daily for better control of his atrial fibrillation.  The patient said that over the past week, he has been breathing better  and has noted a rapid decrease in his edema with the diuretic.  He does  report that today he developed some nausea and vomiting, which he thinks  is probably related to something that he ate.   PHYSICAL EXAMINATION:  VITAL SIGNS:  His blood pressure 142/73 and his  pulse is 67 and regular.  Respiratory rate is 18 and unlabored.  Oxygen  saturation on room air is 94%.  GENERAL:  He looks tired and he does not feel well.  CARDIAC:  Irregular rate and rhythm.  LUNGS:  Clear.  EXTREMITIES:  There is no peripheral edema.   A followup chest x-ray today shows a traumatic improvement in his  bilateral pleural effusions with minimal-to-no pleural fluid remaining.  The lungs are otherwise clear.   IMPRESSION:  The patient appears to be doing much better following  diuresis over the past week with resolution of his pleural effusion and  peripheral edema.  His breathing has improved.  His vital signs remained  stable.  We did order a CBC and BMET today, but those are still  pending.  He did develop some nausea and vomiting today of unclear etiology, but  said that he feels like this is probably something he ate or also a  stomach bug.  He has been able to take fluids.  He has a followup  appointment with Dr. Excell Seltzer tomorrow and if his GI symptoms do not  resolve rapidly, he will see his primary physician.   Evelene Croon, M.D.  Electronically Signed   BB/MEDQ  D:  02/01/2009  T:  02/01/2009  Job:  [id number]   cc:   Veverly Fells. Excell Seltzer, MD

## 2011-04-17 NOTE — Cardiovascular Report (Signed)
NAME:  JAHLEEL, STROSCHEIN NO.:  [id number]   MEDICAL RECORD NO.:  [id number]          PATIENT TYPE:  INP   LOCATION:  3303                         FACILITY:  [facility]   PHYSICIAN:  Pricilla Riffle, MD, FACCDATE OF BIRTH:  September 04, 1946   DATE OF PROCEDURE:  02/04/2009  DATE OF DISCHARGE:                            CARDIAC CATHETERIZATION   PROCEDURE:  Cardioversion.     The patient is a 65 year old with atrial fibrillation.   The patient was sedated by Anesthesia.  With pads in the AP position,  the patient was cardioverted with 200 joules synchronized biphasic  energy to sinus rhythm.  A 12-lead EKG pending.  Procedure without  complications.      Pricilla Riffle, MD, [facility]  Electronically Signed     PVR/MEDQ  D:  02/06/2009  T:  02/07/2009  Job:  [id number]

## 2011-04-17 NOTE — Discharge Summary (Signed)
Alejandro [name], Alejandro [name] NO.:  [id number]   MEDICAL RECORD NO.:  [id number]          PATIENT TYPE:  INP   LOCATION:  2007                         FACILITY:  [facility]   PHYSICIAN:  Evelene Croon, M.D.     DATE OF BIRTH:  Aug 07, 1946   DATE OF ADMISSION:  12/22/2008  DATE OF DISCHARGE:  12/29/2008                               DISCHARGE SUMMARY   PRIMARY ADMITTING DIAGNOSES:  1. Shortness of breath.  2. Lower extremity edema.   ADDITIONAL/DISCHARGE DIAGNOSES:  1. Severe three-vessel coronary artery disease with high-grade distal      left main stenosis.  2. New-onset ischemic congestive heart failure.  3. History of tobacco abuse.  4. Postoperative urinary retention, resolved.   PROCEDURES PERFORMED:  1. Cardiac catheterization.  2. Coronary artery bypass grafting x4 (left internal mammary artery to      the left anterior descending, saphenous vein graft to the diagonal,      saphenous vein graft to the obtuse marginal, saphenous vein graft      to the posterior descending).  3. Endoscopic vein harvest, right leg.   HISTORY:  The patient is a 65 year old male with no significant past  medical history.  For 2-3 weeks prior to this admission, he has had  increasing shortness of breath, which progressed into orthopnea and  paroxysmal nocturnal dyspnea.  He also admits to having some chest pain  with exertion over the same time period, which was not associated with  diaphoresis, nausea, or vomiting and was relieved with rest.  On the  date of this admission, he noted significant lower extremity edema and  subsequently presented to the emergency department for evaluation.  His  cardiac enzymes were negative, but it was felt that he should be  admitted for further evaluation and treatment.   HOSPITAL COURSE:  Mr. Iiams was seen by [facility] and was  admitted with a diagnosis of likely ischemic CHF.  He was started on  diuretics as well as a beta-blocker,  aspirin, and an ACE inhibitor.  He  underwent cardiac catheterization on December 23, 2008, by Dr. Excell Seltzer,  which showed critical left main stenosis at least 95%.  He also had a  90% stenosis of the LAD beyond the diagonal, a 70% left circumflex  marginal branch, and no significant stenosis in the right coronary  system.  The left ventricular ejection fraction was about 25-30% with  severe global hypokinesis.  He was not felt to be a good candidate for  percutaneous intervention.  He was started back on heparin, and a  Cardiac Surgery consult was obtained.  The patient was seen by Dr. Evelene Croon, and his films were reviewed.  Dr. Laneta Simmers felt that his best  treatment option at this point would be to proceed with surgical  revascularization.  He explained all risks, benefits, and alternatives  of surgery to the patient, and he agreed to proceed.   He was taken to the operating room on December 24, 2008, and underwent  CABG x4 as described in detail above.  He tolerated the procedure  well  and was transferred to the SICU in stable condition.  He was able to be  extubated shortly after surgery.  He was hemodynamically stable and  doing well on postop day #1.  At that time, his chest tubes and lines  were discontinued, and he was transferred to the Step-Down Unit.  His  postoperative course has been relatively uneventful.  He did have some  urinary retention following initial removal of his Foley catheter and  did require replacement of his Foley.  This was left in for an  additional 24 hours and a voiding trial was performed, which was  successful.  He has since then been voiding without problem.  A urine  culture was obtained, which yielded no growth.  Otherwise, he has done  well.  He has been restarted on beta-blocker and ACE inhibitor and has  been started on a statin.  He has been mildly volume overloaded and was  started on Lasix to which he is responding well.  His incisions are all   healing well.  He has been afebrile and all vital signs have been  stable.   His most recent labs on postop day #4 show hemoglobin 12.6, hematocrit  37.5, white count 10.3, and platelets 174.  Sodium 133, potassium 4.7,  BUN 19, and creatinine 1.33.  It is felt that if he continues to remain  stable over the next 24 hours, he will hopefully be ready for discharge  home on December 29, 2008.   DISCHARGE MEDICATIONS:  1. Enteric-coated aspirin 325 mg daily.  2. Lipitor 20 mg nightly.  3. Lopressor 25 mg b.i.d.  4. Lisinopril 20 mg daily.  5. Lasix 40 mg daily x3 days.  6. Potassium 20 mEq daily x3 days.  7. Tylox 1-2 q.4 h. p.r.n. for pain.   DISCHARGE INSTRUCTIONS:  He is asked to refrain from driving, heavy  lifting, or strenuous activity.  He may continue ambulating daily and  using his incentive spirometer.  He may shower daily and clean his  incisions with soap and water.  He will continue a low-fat, low-sodium  diet.   DISCHARGE FOLLOWUP:  He is asked to make an appointment to see Dr.  Excell Seltzer in 2 weeks.  He will then follow up with Dr. Laneta Simmers on January 25, 2009, with a chest x-ray prior to this appointment from [facility]  imaging.  In the interim, if he experiences any problems or has  questions, he is asked to contact our office immediately.      Coral Ceo, P.A.      Evelene Croon, M.D.  Electronically Signed    GC/MEDQ  D:  12/28/2008  T:  12/29/2008  Job:  [id number]   cc:   Veverly Fells. Excell Seltzer, MD  [facility] office

## 2011-04-17 NOTE — Discharge Summary (Signed)
NAMEELSTON, ALDAPE                 ACCOUNT NO.:  [id number]   MEDICAL RECORD NO.:  [id number]          PATIENT TYPE:  INP   LOCATION:  6715                         FACILITY:  [facility]   PHYSICIAN:  Evelene Croon, M.D.     DATE OF BIRTH:  07-13-1946   DATE OF ADMISSION:  02/01/2009  DATE OF DISCHARGE:                               DISCHARGE SUMMARY   ADDENDUM   Lopressor 25 mg b.i.d. was changed to Coreg 6.25 mg p.o. b.i.d.      Evelene Croon, M.D.  Electronically Signed     BB/MEDQ  D:  02/09/2009  T:  02/09/2009  Job:  [id number]

## 2011-04-17 NOTE — Op Note (Signed)
NAMEMATH, BRAZIE NO.:  [id number]   MEDICAL RECORD NO.:  [id number]          PATIENT TYPE:  INP   LOCATION:  2302                         FACILITY:  [facility]   PHYSICIAN:  Ollen Gross. Vernell Morgans, M.D. 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   POSTOPERATIVE DIAGNOSIS:  Incarcerated umbilical hernia with small bowel  obstruction.   PROCEDURES:  Exploratory laparotomy with lysis of adhesions and primary  repair of ventral hernia.   SURGEON:  Ollen Gross. Vernell Morgans, MD   ANESTHESIA:  General endotracheal.   PROCEDURE:  After informed consent was obtained, the patient was brought  to the operating room and placed in the supine position on the operating  table.  After adequate induction of general anesthesia, the patient's  abdomen was prepped with Betadine and draped in usual sterile manner.  A  midline incision was made with a 10-blade knife through the umbilicus.  This incision was carried down through the skin into the subcutaneous  tissue sharply with the electrocautery.  The hernia sac was dissected  free from the rest of subcutaneous tissue by a combination of some blunt  hemostat dissection and sharp dissection with Metzenbaum scissors.  Once  this was accomplished, we were able to identify the base of the hernia.  The fascia superior to the defect was opened sharply with the  electrocautery.  Once we were able to do this, we relieved some of the  tension on the hernia.  We were able to open the hernia sac.  This was  all done sharply with the electrocautery.  Once into the hernia sac, we  were able to identify the contents, there was some adhesed omentum in  the hernia sac as well as a loop of small intestine.  The small  intestine was slightly ischemic and bruised, but once the tension was  relieved, the small bowel appeared to be recovering nicely.  We were  able then to reduce the small bowel back into the abdomen.  The adhesed  omentum was divided sharply with a LigaSure.  The skin at the overlying  hernia sac was a little bit ischemic, so we at that point excised the  skin of the umbilicus.  This was done sharply with the 10-blade knife  and electrocautery.  The entire hernia sac was also excised sharply  using the electrocautery down to the fascial defect.  Once this was  accomplished, the hernia sac specimen was sent to Pathology.  The  fascial edges appeared to be healthy and intact.  The wound was examined  and found to be hemostatic.  The small bowel was examined again and did  appear to be recovering well.  At this point, the fascial defect of the  anterior abdominal wall was closed with two #1 running PDS sutures.  The  subcutaneous tissue was irrigated with copious amounts of saline and  Betadine, and the skin was closed  with staples.  Sterile dressings were  applied.  The patient tolerated the procedure well.  At the end of the  case, all needle, sponge, and instrument counts were correct.  The  patient was then awakened and taken to recovery room in stable  condition.      Ollen Gross. Vernell Morgans, M.D.  Electronically Signed    PST/MEDQ  D:  02/02/2009  T:  02/02/2009  Job:  [id number]

## 2011-04-17 NOTE — Op Note (Signed)
Alejandro [name], HERSHEY NO.:  [id number]   MEDICAL RECORD NO.:  [id number]          PATIENT TYPE:  INP   LOCATION:  2303                         FACILITY:  [facility]   PHYSICIAN:  Evelene Croon, M.D.     DATE OF BIRTH:  1946/05/21   DATE OF PROCEDURE:  12/24/2008  DATE OF DISCHARGE:                               OPERATIVE REPORT   PREOPERATIVE DIAGNOSIS:  High-grade distal left main and severe three-  vessel coronary artery disease.   POSTOPERATIVE DIAGNOSIS:  High-grade distal left main and severe three-  vessel coronary artery disease.   OPERATIVE PROCEDURE:  Median sternotomy, extracorporeal circulation,  coronary artery bypass graft surgery x4 using a left internal mammary  artery graft to the left anterior descending coronary artery with a  saphenous vein graft to diagonal branch of the LAD, a saphenous vein  graft to the obtuse marginal branch of the left circumflex coronary  artery, and a saphenous vein graft to the posterior descending coronary  artery.  Endoscopic vein harvesting from the right leg.   ATTENDING SURGEON:  Evelene Croon, MD   ASSISTANT:  Coral Ceo, PA-C   ANESTHESIA:  General endotracheal.   CLINICAL HISTORY:  This patient is a 65 year old white male smoker who  was admitted with a 2-3 week history of shortness of breath, PND, and  orthopnea.  He had a 1-week history of lower extremity edema.  He has  had chest pain with exertion over the past month.  On admission, his  cardiac enzymes were negative.  Electrocardiogram showed nonspecific  changes.  His BNP was elevated at 1801 and he was noted to have  significant lower extremity edema.  Cardiac catheterization was  performed by Dr. Tonny Bollman and this showed at least 95% distal left  main stenosis.  The LAD was a moderate-sized vessel that did not reach  the apex.  It gave off a large first diagonal branch.  The LAD itself  had about 90% stenosis beyond the large diagonal branch.   The left  circumflex gave off a single marginal branch that had about 70%  stenosis.  The right coronary artery was diffusely irregular but with no  significant stenosis.  The posterior descending branch was occluded with  faint filling of the distal vessel by collaterals in the left.  There is  also a large posterolateral branch.  Left ventricular ejection fraction  was about 25-30% with global severe hypokinesis.  There was no  significant mitral regurgitation and no evidence of aortic insufficiency  or stenosis.  After review of the catheterization and examination of the  patient, it was felt that coronary artery bypass graft surgery was the  best treatment to prevent further ischemia and infarction and death.  I  discussed the operative procedure with the patient and his family.  We  discussed alternatives, benefits, and risks including but not limited to  bleeding, blood transfusion, infection, stroke, myocardial infarction,  graft failure, and death.  He understood and agreed to proceed.   OPERATIVE PROCEDURE:  The patient was taken to the operating room and  placed on the table in supine position.  After induction of general  endotracheal anesthesia, a Foley catheter was placed in the bladder  using a sterile technique.  Then the chest, abdomen and both lower  extremities were prepped and draped in usual sterile manner.  Transesophageal echocardiogram was performed by Anesthesiology and read  by Dr. Sharee Holster.  This showed ejection fraction to be about 25%  with severe global hypokinesis.  There was no significant mitral  regurgitation and no aortic stenosis or insufficiency.  There was a  large pericardial effusion as well as bilateral pleural effusions.   Then the chest was opened through a median sternotomy incision and the  pericardium opened at midline.  Examination of the heart showed good  right ventricular contractility although the heart was obviously  distended  with elevated pulmonary pressures.  The patient had a large  serous pericardial effusion.  Both pleural spaces were opened and there  was greater than 1 L of pleural fluid on both sides which was serous.  The ascending aorta had no palpable plaques in it.   Then the left internal mammary artery was harvested from the chest wall  to pedicle graft.  This is a large caliber vessel with excellent blood  flow through it.  At the same time, a segment of greater saphenous vein  was harvested from the right leg using endoscopic vein harvest  technique.  This vein was of medium size and good quality.   Then the patient was heparinized and when an adequate activated clotting  time was achieved, the distal ascending aorta was cannulated using a 20-  [place] aortic cannula for arterial inflow.  Venous outflow was achieved  using a two-stage venous cannula for the right atrial appendage.  An  antegrade cardioplegia and vent cannula was inserted in aortic root.   The patient was placed on cardiopulmonary bypass and distal coronaries  identified.  The LAD was a moderate sized vessel with no distal disease  in it.  This vessel did not reach the apex of the heart.  The diagonal  branch was actually slightly larger than the LAD itself and had mild  distal disease in it.  The left circumflex had a single large marginal  that had three sub-branches.  This vessel had no significant distal  disease in it.  The right coronary artery was a large vessel that had  diffuse plaque in it.  The posterior descending branch was heavily  diseased proximally with moderate disease extending out into the  midportion.  Distally, the vessel wrapped around the apex.  It was  graftable distally although smaller.  The posterolateral branch was a  large vessel with no significant disease in it.   Then the aorta was cross-clamped and 1000 mL of cold blood antegrade  cardioplegia was administered in the aortic root with quick  arrest of  the heart.  Systemic hypothermia to 20 degree centigrade and topical  hypothermic iced saline was used.  A temperature probe was placed in the  septum and insulating pad in the pericardium.   The first distal anastomosis was performed to the obtuse marginal  branch.  The internal diameter of this vessel was about 2 mm.  Conduit  used was a segment of greater saphenous vein and the anastomosis  performed in an end-to-side manner using continuous 7-0 Prolene suture.  Flow was noted through the graft and was excellent.   A second distal anastomosis was performed to the posterior descending  coronary artery.  The internal diameter distally was about 1.5 mm.  The  conduit used was a second segment of greater saphenous vein and the  anastomosis performed in an end-to-side manner using continuous 7-0  Prolene suture.  Flow was noted through the graft and was excellent.  Then a dose of cardioplegia was given down the vein grafts and aortic  root.   The third distal anastomosis was performed to the diagonal branch.  The  internal diameter of this vessel was about 1.75 mm.  The conduit used  was a third segment of greater saphenous vein and anastomosis performed  in an end-to-side manner using continuous 7-0 Prolene suture.  Flow was  noted through the graft and was excellent.   The fourth distal anastomosis was performed to the midportion of the  left anterior descending coronary artery.  The internal diameter was  about 1.6-1.75 mm.  The conduit used was the left internal mammary graft  was brought through an opening in the left pericardium anterior to the  phrenic nerve.  It was anastomosed to the LAD in an end-to-side manner  using continuous 8-0 Prolene suture.  The pedicle was sutured to the  epicardium with 6-0 Prolene sutures.  Then another dose of cardioplegia  was given.  The two proximal vein graft anastomoses were then performed  to the aortic root in an end-to-side  manner using continuous 6-0 Prolene  suture.  The proximal anastomosis of the diagonal vein graft was  performed end-to-side to the proximal portion of the obtuse marginal  vein graft since the diagonal vein graft was a few centimeters too short  to reach the aorta.  This was anastomosed to the obtuse marginal vein  graft in an end-to-side manner using continuous 7-0 Prolene suture.  I  felt this would be an acceptable solution since we essentially harvested  all the vein from his right leg and I did not want to get into his left  leg.  He had such a large heart that it took over three scissor length  of vein just to do these bypasses.  Then the clamp was removed from the  mammary pedicle.  There was rapid warming of the ventricular septum and  return of spontaneous ventricular fibrillation.  The cross-clamp was  removed with a time of 91 minutes and the patient spontaneously  converted to sinus rhythm.  The proximal and distal anastomoses appeared  hemostatic while the grafts satisfactory.  Graft markers were placed  around the proximal anastomoses.  Two temporary right ventricular and  right atrial pacing wires were placed above the skin.   When the patient was rewarmed to 37 degrees centigrade, he was weaned  from cardiopulmonary bypass on low-dose dopamine.  Total bypass time was  113 minutes.  Cardiac function appeared good with a cardiac output of  4.5 to 5 L/min.  Transesophageal echocardiogram showed slight  improvement in left ventricular contractility.  There was no mitral  regurgitation.  Then protamine was given and the venous and aortic  cannula was removed without difficulty.  Hemostasis was achieved.  Four  chest tubes were placed with bilateral pleural tubes with two in the  posterior pericardium and one in the anterior mediastinum.  The  pericardium was loosely reapproximated over the heart.  Sternum was  closed with double #6 stainless steel wires.  The fascia was closed  with  continuous #1 Vicryl suture.  Subcutaneous tissue was closed with  continuous 2-0 Vicryl and the skin  with a 3-0 Vicryl subcuticular  closure.  The lower extremity vein harvest site was closed in layers in  a similar manner.  The sponge, needle and instrument counts were correct  according to scrub nurse.  Dry sterile dressing was applied over the  incisions around the chest tubes which were Pleur-evac suction.  The  patient remained hemodynamically stable and was transported to the SICU  in guarded but stable condition.      Evelene Croon, M.D.  Electronically Signed     BB/MEDQ  D:  12/24/2008  T:  12/25/2008  Job:  [id number]   cc:   Veverly Fells. Excell Seltzer, MD

## 2011-04-17 NOTE — Assessment & Plan Note (Signed)
OFFICE VISIT   Alejandro, [name]  DOB:  11-Apr-1946                                        January 25, 2009  CHART #:  [id number]   The patient returned to my office today, status post coronary artery  bypass graft surgery x4 on December 24, 2008.  He had presented with a  95% distal left main stenosis with an ejection fraction of 25-30% with  severe global hypokinesis.  He had no significant aortic valve or mitral  valve regurgitation.  At times, surgery had large bilateral pleural  effusions as well as large pericardial effusion.  Since discharge, he  said that he was doing well for a few days, but then developed some  shortness of breath after that and has had some worsening shortness of  breath over the past few days particularly when lying flat.  He has been  sleeping and sitting up in a chair.  His wife has been checking his  weight and noticed that he put on about 4 pounds over the past day.  He  denies any chest pain or pressure.  His energy level has still been  decreased.   PHYSICAL EXAMINATION:  VITAL SIGNS:  Today blood pressure 146/90, pulse  is 96 with frequent extra beats.  Respiratory rate is 18 and unlabored.  Oxygen saturation is 98% on room air.  His weight is 196 pounds today.  GENERAL:  He looks tired.  CARDIAC:  A regular rate and rhythm with frequent extra beats.  LUNGS:  Decreased breath sounds over both lower lobes.  The chest  incision is healing well and the sternum is stable.  EXTREMITIES:  His leg incision is healing well.  There is moderate  bilateral lower extremity edema to the knees, worse on the right than  the left.   Followup chest x-ray today shows moderate bilateral pleural effusions  that have increased since his pre-discharged chest x-ray.   MEDICATIONS:  Aspirin 325 mg daily, Lipitor 20 mg daily, Coreg 6.25 mg  b.i.d., and lisinopril 20 mg daily.   IMPRESSION:  The patient has had some fluid retention since  discharge  with development of significant lower extremity edema and bilateral  pleural effusions.  He said that he has taken periodic diuretics since  discharge as directed by Dr. Earmon Phoenix office, but has not been on them  consistently.  I will start him on Lasix 80 mg p.o. b.i.d. x7 days and  potassium chloride 40 mEq daily x7 days.  He is a followup appointment  to see Dr. Excell Seltzer on January 27, 2009, later this week and I will plan  to see him back next Tuesday with a repeat chest x-ray as well as a BMET  and CBC.  Hopefully, we can improve his pleural effusions with diuresis  and if not, this may require thoracentesis.  He may require a followup  echocardiogram to rule out pericardial effusion and to reassess his left  ventricular function, but I will leave that decision up to Dr. Excell Seltzer.   Evelene Croon, M.D.  Electronically Signed   BB/MEDQ  D:  01/25/2009  T:  01/25/2009  Job:  [id number]   cc:   Veverly Fells. Excell Seltzer, MD

## 2011-04-17 NOTE — Consult Note (Signed)
Alejandro [name], Alejandro [name] NO.:  [id number]   MEDICAL RECORD NO.:  [id number]          PATIENT TYPE:  INP   LOCATION:  2302                         FACILITY:  [facility]   PHYSICIAN:  Wendi Snipes, MD DATE OF BIRTH:  02-01-1946   DATE OF CONSULTATION:  02/02/2009  DATE OF DISCHARGE:                                 CONSULTATION   CARDIOLOGIST:  Dr. Excell Seltzer.   REQUESTING PHYSICIAN:  Dr. Carolynne Edouard.   CHIEF COMPLAINT:  Abdominal pain.   HISTORY OF PRESENT ILLNESS:  This is a 65 year old white male with a  history of coronary artery disease and ischemic cardiomyopathy status  post recent coronary bypass graft who presents with the acute onset of  abdominal pain, distention, nausea and vomiting.  He has had an  umbilical hernia for some time, and he noticed abdominal pain starting  this morning which was associated with several bouts of emesis and  progressive pain.  He was seen in the ED at [facility], and there was  some concern for incarcerated hernia.  He is currently being evaluated  by surgery for possible emergent procedure.  At this time, he states his  periumbilical pain is improved, and since his surgery,  he has had no  chest pain.  He has had improved paroxysmal nocturnal dyspnea and does  not complained of syncope, presyncope, orthopnea or increased lower  extremity edema.   PAST MEDICAL HISTORY:  1. Recent diagnosis of ischemic cardiomyopathy with an EF 25-30%.  2. Coronary artery disease status post coronary artery bypass graft in      January 2010:  LIMA to the LAD, LIMA to the LAD, SVG to the D1, SVG      to the OM1, SVG to the PDA.   ALLERGIES:  No known drug allergies.   MEDICATIONS:  1. Aspirin 325 mg daily.  2. Lipitor 20 mg daily.  3. Lopressor 25 mg twice daily.  4. Lisinopril 20 mg daily.   SOCIAL HISTORY:  Lives in [place] with his wife.  He worked for  [employer].  He has recently quit smoking.  He smoked  approximately  half pack per day for most of his life.   FAMILY HISTORY:  His mother and father had diabetes and heart problems.  His brother had a coronary artery bypass graft at 54.   REVIEW OF SYSTEMS:  All 14 systems reviewed and were negative except as  mentioned in detail in the HPI.   PHYSICAL EXAMINATION:  His blood pressure is 184/97, respiratory rate  16, pulse 110.  He is currently afebrile, saturating 95% on 2 liters  nasal cannula.  GENERAL:  He is a 65 year old white male appearing stated age in mild  acute distress.  HEENT:  Moist mucous membranes.  Pupils are equal, round, reactive to  light and accommodation.  Anicteric sclera.  NECK:  No jugular venous distention.  No thyromegaly.  CARDIOVASCULAR:  He is tachycardic with no murmurs, rubs or gallops.  LUNGS:  Clear to auscultation bilaterally.  ABDOMEN:  Nondistended.  He has periumbilical tenderness to palpation.  Otherwise soft.  EXTREMITIES:  No clubbing, cyanosis or edema.  NEUROLOGIC:  Alert and oriented x3.  Cranial nerves II-XII grossly  intact.  No focal neurologic deficits.  SKIN:  Warm, dry intact.  No rashes.  PSYCH:  Mood and affect are appropriate.   RADIOLOGY:  His chest x-ray shows no acute process.  CT abdomen and  pelvis is pending.  EKG pending.   LABORATORY REVIEW:  White blood cell count is 10.  His hematocrit is 46.  His platelets are 292.  His potassium is 4.9.  His creatinine is 1.2.   ASSESSMENT AND PLAN:  Assessment is a 65 year old with history of  coronary disease status post recent coronary artery bypass graft here  with symptoms concerning for an incarcerated hernia.  1. Coronary artery disease status post coronary artery bypass      grafting.  He is currently moderate to high risk for perioperative      cardiac complications for noncardiac surgery.  He may proceed with      the surgery, especially if this is an emergent procedure.  Please      continue his current medical regimen including  aspirin if his bleed      risk is low enough.  This includes perioperative beta blockers.  He      is currently chest pain free.  Please obtain an EKG.  2. Ischemic cardiomyopathy, ejection fraction 25%.  Will recommend      careful volume resuscitation in the perioperative period.  He is      currently mildly dehydrated, however, well perfused.  He is      currently well compensated.  Consider postoperative echocardiogram      if he shows any signs of volume overload.  However, I will continue      his current medical regimen at this time.  Will follow.      Wendi Snipes, MD  Electronically Signed     BHH/MEDQ  D:  02/02/2009  T:  02/02/2009  Job:  [id number]

## 2011-04-17 NOTE — Op Note (Signed)
Alejandro [name], CRUMPLER NO.:  [id number]   MEDICAL RECORD NO.:  [id number]          PATIENT TYPE:  INP   LOCATION:  2303                         FACILITY:  [facility]   PHYSICIAN:  Burna Forts, M.D.DATE OF BIRTH:  03-Oct-1946   DATE OF PROCEDURE:  12/24/2008  DATE OF DISCHARGE:                               OPERATIVE REPORT   INDICATIONS FOR PROCEDURE:  Mr. Primo Innis is a 65 year old gentleman  patient of Olga Millers presents today for coronary artery bypass  grafting by Dr. Evelene Croon and requested by Dr. Laneta Simmers.  We placed TEE  probe for evaluation of cardiac structures and function spacing down the  setting of reduced cardiac function.   The patient was brought to the holding area, where under local  anesthesia and sedation pulmonary arterial and radial arterial lines are  placed.  He has been taken to the OR for routine induction of general  anesthesia.  After which the TEE probe is prepared, lubricated,  and  inserted.   PRE-CARDIOPULMONARY BYPASS EXAMINATION:  Left ventricle:  After initial  insertion in a view of the short axis of the left ventricular chamber,  it is immediately apparent that this patient has a moderate anterior and  posterior fusion in the short axis view.  We see that there is global  dysfunction with an estimated ejection fraction of about 25%.  There is  hypokinesis anteriorly and anterolaterally, but this areas are in fact  contractile.  The worse areas of the heart are in fact septally with  nearly akinetic and in the inferior wall as it moved into the septal  area posteriorly, which is also nearly akinetic.  Overall, there is  severe cardiac dysfunction noted.  There is mildly increased left  ventricular wall diameter in the short axis view.  Long axis view again  shows the newly akinetic inferior posterior wall and is somewhat  contractile anterior wall of the heart.   Mitral valve:  This is just a mildly thickened mitral  valve apparatus,  appears to open appropriately during diastolic inflow and close with  satisfactorily coaptation.  There is only trace mitral regurgitant flow  appreciated.   Left atrium:  Essentially normal left atrial chamber in size and  function.  There are no masses noted within.  The interatrial septum is  interrogated and is intact.  We were able to locate the pulmonary vein  and the Dopplers in that vein.  There was markedly diminished S-wave as  compared to the D-wave in the pulmonary vein indicative of relatively  high pressures in his left atrial chamber, which would in fact be  associated with diminished left ventricular chamber function.   Aortic valve:  Very mild aortic sclerosis.  There are 3 cusps noted.  They open appropriate.  There was no obstruction to flow.   Right ventricle:  Mildly diminished in its overall function and  contractility.   Tricuspid valve:  Been compliant,  mobile with 1+ tricuspid regurgitant  flow noted in this chamber.   The rest of the cardiac exam was unremarkable.  The patient was placed on cardiopulmonary bypass and coronary artery  bypass grafting is carried out.  The patient is then rewarmed and  separated with the initial attempt from cardiopulmonary bypass.   POST-CARDIOPULMONARY BYPASS EXAMINATION:  Limited exam.   Left ventricle.  The left ventricular chamber now seen in the both long  and short-axis views.  There may be suggestion of mildly increased  cardiac function in this views now, but essentially the anterior wall  and the anterolateral wall remained contractile.  Rest of the cardiac  structure septally and inferiorly and posteriorly remains severely  hypokinetic.  Overall, it does appear to be improved from the baseline  view.   The rest of the cardiac examination is conducted looking at all the  chambers and valves, essentially there are no changes and the patient  returned to the Cardiac Intensive Care Unit in  stable condition.           ______________________________  Burna Forts, M.D.     JTM/MEDQ  D:  12/24/2008  T:  12/25/2008  Job:  [id number]
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
## 2011-04-17 NOTE — H&P (Signed)
NAMESIR, MALLIS NO.:  [id number]   MEDICAL RECORD NO.:  [id number]          PATIENT TYPE:  INP   LOCATION:                               FACILITY:  [facility]   PHYSICIAN:  Ollen Gross. Vernell Morgans, M.D. DATE OF BIRTH:  02/01/1946   DATE OF ADMISSION:  02/02/2009  DATE OF DISCHARGE:                              HISTORY & PHYSICAL   HISTORY OF PRESENT ILLNESS:  Mr. Willner is a 65 year old white male who  has a history of an umbilical hernia for many years, which over the last  day or two has become hard and painful.  He is not running any fevers.  He has had some nausea and vomiting associated with it.  He feels as  though he has been having normal bowel movements as recently as this  morning.  He also has a pretty significant cardiac history, and recently  underwent a CABG by Dr. Laneta Simmers, and is followed by Dr. Excell Seltzer at [facility].   REVIEW OF SYSTEMS:  He otherwise denies any fevers, chills, chest pain,  shortness of breath, diarrhea, or dysuria.  His other review of systems  is unremarkable.   PAST MEDICAL HISTORY:  Significant for coronary artery disease,  congestive heart failure, and GI bleed.   PAST SURGICAL HISTORY:  Significant for coronary artery bypass grafting  in late January.   MEDICATIONS:  Include aspirin, Lipitor, Lasix, lisinopril, Cardura, and  potassium.   ALLERGIES:  No known drug allergies.   SOCIAL HISTORY:  He smokes about half a pack of cigarettes a day.  Denies any alcohol use.   FAMILY HISTORY:  Noncontributory.   PHYSICAL EXAMINATION:  VITAL SIGNS:  His temperature is 97.1, blood  pressure 182/97, and pulse of 110.  GENERAL:  He is an elderly white male in no acute distress.  SKIN:  Warm and dry.  No jaundice.  EYES:  Extraocular movements intact.  Pupils equal, round, and reactive  to light.  Sclerae nonicteric.  LUNGS:  Clear bilaterally with no use of accessory respiratory muscles.  HEART:  Regular rate and rhythm with  the impulse in the left chest.  ABDOMEN:  Soft, but he does have an incarcerated hernia at his umbilicus  that is tender.  The rest of his belly is soft and nondistended.  EXTREMITIES:  No cyanosis, clubbing, or edema with good strength in his  arms and legs.  PSYCHOLOGIC:  He is alert and oriented x3 with no evidence today of  anxiety or depression.   LABORATORY DATA:  On review of his lab, significant for a white count of  10,000, which was normal.  His plain abdominal x-ray showed no signs of  obstruction.   ASSESSMENT AND PLAN:  This is a 65 year old gentleman with significant  cardiac history.  He also has an incarcerated umbilical hernia.  A big  question is whether this is an incarcerated omentum or incarcerated  intestine.  If it is incarcerated omentum, then we will be able to have  Cardiology see him, and help tune him up prior to needing a  surgical  intervention.  If this is incarcerated intestine with obstruction, then  he may need to have urgent surgery tonight.  We will obtain a CT scan.  I will consult Cardiology and then we will proceed accordingly.  I have  discussed with him in detail the risk and benefits of the operation to  possibly fix this hernia urgently tonight, as well as some of the  technical aspects including the possibility of having a bowel resection  and myocardial infarction.  He understands and wishes to proceed.      Ollen Gross. Vernell Morgans, M.D.  Electronically Signed     PST/MEDQ  D:  02/02/2009  T:  02/02/2009  Job:  [id number]

## 2011-04-17 NOTE — Discharge Summary (Signed)
NAMEBRAXDEN, LOVERING                 ACCOUNT NO.:  [id number]   MEDICAL RECORD NO.:  [id number]          PATIENT TYPE:  INP   LOCATION:  6715                         FACILITY:  [facility]   PHYSICIAN:  Sharlet Salina T. Hoxworth, M.D.DATE OF BIRTH:  August 07, 1946   DATE OF ADMISSION:  02/01/2009  DATE OF DISCHARGE:  02/11/2009                               DISCHARGE SUMMARY   PROCEDURES:  Exploratory laparotomy with lysis of adhesions and repair  of ventral hernia by Dr. Carolynne Edouard on February 02, 2009.   CONSULTATIONS:  Dr. Marcelle Overlie with Cardiology.   REASON FOR ADMISSION:  Mr. Burgueno is a 65 year old white male with a  history of an umbilical hernia for many years which over the last day or  two has become hard and painful.  He is not running any fevers and has  had some associated nausea and vomiting.  He did state that he had  normal bowel movement recently.  He presented to the emergency  department due to this pain and a CT scan of abdomen and pelvis was  obtained which showed a small bowel obstruction due to a ventral hernia  containing a loop of the incarcerated small bowel.  At that time, the  patient was admitted for surgical intervention.  Please see admitting  history and physical for further details.   ADMITTING DIAGNOSES:  1. Incarcerated umbilical hernia with associated small-bowel      obstruction.  2. Coronary artery disease.  3. Congestive heart failure.  4. History of GI bleed.   HOSPITAL COURSE:  This time due to the patient's significant cardiac  history, Dr. Marcelle Overlie of Cardiology was consulted to help with care of  this patient from a cardiac standpoint while in the hospital.  Soon as  the patient was admitted, he was taken to the operating room where an  exploratory laparotomy with lysis of adhesion and repair of a ventral  hernia was completed.  His small bowel at this time did show some  ischemia but it recovered by the end of the case and the resection was  completed.  The  patient developed a postop ileus for the first couple of  days postoperatively.  He was advanced to clear liquids by about  postoperative day 1-2 and due to some mild nausea and abdominal  distention he was continued on these for several days.  By postoperative  day #5, he was doing well without any nausea and his diet was advanced.  By postoperative day #7, the patient was tolerating a regular diet as  well as p.o. pain meds.  Otherwise from a surgical standpoint, the  patient's admission was otherwise uneventful.  Of note, he did have some  bleeding from his wound due to a supratherapeutic INR, however, this was  controlled and no longer an issue.   During the patient's postoperative course, he did have some atrial  fibrillation with rapid ventricular response.  He was started on  Cardizem but due to his decreased ejection fraction, Cardiology felt  that it would be better to cardiovert the patient.  Therefore, the  patient underwent  cardioversion on postoperative day #2.  Throughout the  hospitalization, the patient was continued on his Lasix, lisinopril, and  Coreg.  Due to a supratherapeutic INR for much of his hospitalization,  his Coumadin was held.  On postoperative day #8, the patient did have a  nonsustained small run of V-tach overnight.  At this time, there was  question of whether the patient would need an ICD, however, it was felt  that the patient could get this as an outpatient after he recovered from  his surgery.  By postoperative day #9, the patient was surgically stable  as well as stable from a cardiac standpoint and it was thought at this  time that the patient may be discharged home.   DISCHARGE DIAGNOSES:  1. Status post exploratory laparotomy with ventral hernia repair      secondary to incarcerated ventral hernia with small-bowel      obstruction.  2. Supratherapeutic INR which was still 3.3 on discharge.      Nonsustained ventricular tachycardia in the  setting of low      magnesium which was replaced and corrected.  3. New onset atrial fibrillation.  4. Coronary artery disease.  5. 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   DISCHARGE MEDICATIONS:  1. Potassium 20 mEq 2 tablets daily.  2. Lasix 40 mg b.i.d.  3. Enteric-coated aspirin 325 mg daily.  4. Lipids 20 mg daily.   Tylox 1-2 p.o. q.4 h. p.r.n. pain which he was informed to stop as well  as Lopressor 25 mg b.i.d.  He was informed to resume carvedilol 12.5 mg  b.i.d.  He was given prescriptions for Vicodin 5/325 one to two tablets  q.4 h. p.r.n. pain and was informed to begin taking Coumadin as directed  by Cardiology prior to discharge.   DISCHARGE INSTRUCTIONS:  The patient may resume a low-sodium heart-  healthy diet.  He is to increase his activity slowly and may walk up  steps.  He may shower, however, he is not to lift anything heavier than  10-15 pounds for the next 6 weeks.  He is not to drive for the next 1-2  weeks or while on narcotic pain medicine.  He is otherwise informed to  change his abdominal dressing twice a day as needed.  He is to otherwise  return to see Dr. Carolynne Edouard in 7-10 days and to follow up with Dr. Excell Seltzer who  is his cardiologist as told per Cardiology.      Letha Cape, PA      Lorne Skeens. Hoxworth, M.D.  Electronically Signed    KEO/MEDQ  D:  04/05/2009  T:  04/06/2009  Job:  [id number]   cc:   Ollen Gross. Vernell Morgans, M.D.  Collie Siad, M.D.  Veverly Fells. Excell Seltzer, MD

## 2011-04-20 NOTE — H&P (Signed)
NAME:  Alejandro [name], Alejandro [name] NO.:  [id number]   MEDICAL RECORD NO.:  [id number]                   PATIENT TYPE:  INP   LOCATION:  1827                                 FACILITY:  [facility]   PHYSICIAN:  Althea Grimmer. Luther Parody, M.D.            DATE OF BIRTH:  11/06/1946   DATE OF ADMISSION:  07/15/2003  DATE OF DISCHARGE:                                HISTORY & PHYSICAL   HISTORY OF PRESENT ILLNESS:  Alejandro [name] is a 65 year old male who I was asked  to see in the emergency room for a history of bloody and coffee-ground  containing vomitus yesterday with melena today. He reports no prior history  of gastrointestinal bleeding. He does not complain of abdominal pain or  chronic reflux. He denies dysphagia or weight loss. He reports that he takes  at least four aspirin almost every day for headaches. He also drinks six  beers per day and sometimes more and smokes a pack of cigarettes day. He  reports that he does not see doctors regularly. He was given a prescription  for Nexium by Dr. Georgina Pillion within the last day or so, but he has not yet had  it filled.   PAST MEDICAL HISTORY:  Essentially unremarkable.   CURRENT MEDICATIONS:  None other than the aspirin, four per day.   ALLERGIES:  None reported.   FAMILY HISTORY:  Negative for ulcers, gallstones, inflammatory bowel  disease, or colorectal neoplasia.   SOCIAL HISTORY:  He is married. He smokes one pack of cigarettes per day and  drinks six to seven beers at least every day.   REVIEW OF SYSTEMS:  GENERAL:  No weight loss or night sweats. ENDOCRINE:  No  history of diabetes or thyroid problems. SKIN:  No rash or pruritus. EYES:  No icterus or change in vision. ENT:  No aphthous ulcers or chronic sore  throat. RESPIRATORY:  No shortness of breath, cough, or wheezing. CARDIAC:  No chest pain, palpitations, or history of valvular heart disease.  GASTROINTESTINAL:  As above. GENITOURINARY:  No dysuria or hematuria.  Remainder of review of systems is negative.   PHYSICAL EXAMINATION:  GENERAL:  He is a moderately obese adult male in no  acute distress, afebrile. Blood pressure 175/84, pulse 94 and regular,  respiratory rate 20.  SKIN:  Normal.  HEENT:  Eyes:  Anicteric. Conjunctivae pink. Oropharynx unremarkable. Poor  dentition.  NECK:  Supple without thyromegaly. There is no cervical or inguinal  adenopathy.  CHEST:  Clear to auscultation and percussion.  HEART:  Regular rate and rhythm without murmurs, rubs, or gallops.  ABDOMEN:  Mildly obese and soft without mass, tenderness, or organomegaly.  There is rebound or hernia. Bowel sounds are normal.  RECTAL:  Reveals melena.  EXTREMITIES:  Without clubbing, cyanosis, edema, or rash.   LABORATORY DATA:  Reveal a current hemoglobin of 13.4. Electrolytes normal.  BUN 26. LFTs  normal.   IMPRESSION:  Acute upper GI bleeding in a 65 year old male who takes  aspirin, drinks excessively, and smokes. Rule out gastritis or peptic ulcer  disease or an unlikely neoplasia.   PLAN:  The patient will be admitted to the hospital for observation,  hydration, transfusion if needed. Upper endoscopy is in order, and the  patient is so advised. The procedure is reviewed with him and his wife in  terms of technique, preparation, and risks of complications including  bleeding and perforation. He agrees to proceed, and it will be scheduled  this afternoon. Please see the orders. He will be given intravenous Protonix  and kept NPO until the procedure.                                                Althea Grimmer. Luther Parody, M.D.    PJS/MEDQ  D:  07/15/2003  T:  07/16/2003  Job:  [id number]   cc:   Oley Balm. Georgina Pillion, M.D.  [address]  [place]  [place] [address]  Fax: [phone]

## 2011-05-04 ENCOUNTER — Encounter: Payer: Self-pay | Admitting: Cardiovascular Disease

## 2011-05-21 ENCOUNTER — Ambulatory Visit (INDEPENDENT_AMBULATORY_CARE_PROVIDER_SITE_OTHER): Payer: BC Managed Care – PPO | Admitting: *Deleted

## 2011-05-21 ENCOUNTER — Ambulatory Visit (INDEPENDENT_AMBULATORY_CARE_PROVIDER_SITE_OTHER): Payer: BC Managed Care – PPO | Admitting: Cardiovascular Disease

## 2011-05-21 ENCOUNTER — Encounter: Payer: BC Managed Care – PPO | Admitting: *Deleted

## 2011-05-21 ENCOUNTER — Ambulatory Visit: Payer: BC Managed Care – PPO | Admitting: Cardiovascular Disease

## 2011-05-21 ENCOUNTER — Encounter: Payer: Self-pay | Admitting: Cardiovascular Disease

## 2011-05-21 DIAGNOSIS — I4891 Unspecified atrial fibrillation: Secondary | ICD-10-CM

## 2011-05-21 DIAGNOSIS — I5022 Chronic systolic (congestive) heart failure: Secondary | ICD-10-CM

## 2011-05-21 DIAGNOSIS — I25810 Atherosclerosis of coronary artery bypass graft(s) without angina pectoris: Secondary | ICD-10-CM

## 2011-05-21 DIAGNOSIS — E785 Hyperlipidemia, unspecified: Secondary | ICD-10-CM

## 2011-05-21 LAB — POCT INR: INR: 3.1

## 2011-05-21 LAB — BASIC METABOLIC PANEL
CO2: 23 mEq/L (ref 19–32)
Chloride: 95 mEq/L — ABNORMAL LOW (ref 96–112)
Creatinine, Ser: 1.1 mg/dL (ref 0.4–1.5)
Glucose, Bld: 89 mg/dL (ref 70–99)

## 2011-05-21 NOTE — Assessment & Plan Note (Signed)
Heart rate is controlled he is tolerating anticoagulation with warfarin.

## 2011-05-21 NOTE — Assessment & Plan Note (Signed)
Lipids are at goal is LDL less than 70. He is tolerating statin therapy without problems.

## 2011-05-21 NOTE — Patient Instructions (Signed)
bmet today. We will call you with results.  Your physician wants you to follow-up in:6 months You will receive a reminder letter in the mail two months in advance. If you don't receive a letter, please call our office to schedule the follow-up appointment.

## 2011-05-21 NOTE — Assessment & Plan Note (Signed)
The patient is stable without evidence of volume overload. He has class I symptoms at present but he is not very active. Will continue current therapy which includes a beta blocker and ACE inhibitor.

## 2011-05-21 NOTE — Assessment & Plan Note (Signed)
The patient is stable without angina. We'll continue his current medical program. His blood pressure is well controlled and hyperlipidemia is treated with lipids at goal. We discussed risk factor modification with efforts at weight loss, exercise, and reduction in alcohol intake.

## 2011-05-21 NOTE — Progress Notes (Signed)
HPI:  This is a 65 year old gentleman presenting for followup evaluation. The patient has coronary artery disease status post coronary bypass surgery in 2010. He presented with critical left main disease and severe LV dysfunction. Left ventricular function recovered following revascularization surgery. A cardiac MRI was reformed in July 2010 and demonstrated left ventricular ejection fraction to be 49%. The patient also has paroxysmal atrial fibrillation and is treated with warfarin.  The patient denies chest pain, dyspnea, edema, orthopnea, PND, or palpitations. He continues to work and thinks he will probably continue with his occupation for another few years. He is not engage in regular exercise. He does some walking but it doesn't seem like this is her regular activity for him. He has not been following a good diet. He admits to drinking at least a few beers every day.  Outpatient Encounter Prescriptions as of 05/21/2011  Medication Sig Dispense Refill  . aspirin 81 MG tablet Take 81 mg by mouth daily.        . carvedilol (COREG) 25 MG tablet TAKE ONE TABLET BY MOUTH TWICE A DAY  180 tablet  2  . furosemide (LASIX) 40 MG tablet Take 20 mg by mouth daily.       Marland Kitchen lisinopril (PRINIVIL,ZESTRIL) 20 MG tablet Take 20 mg by mouth daily.        . simvastatin (ZOCOR) 40 MG tablet Take 40 mg by mouth at bedtime.        Marland Kitchen warfarin (COUMADIN) 2 MG tablet USE AS DIRECTED BY ANTICOAGUALTION CLINIC  100 tablet  0  . DISCONTD: potassium chloride SA (K-DUR,KLOR-CON) 20 MEQ tablet Take 20 mEq by mouth daily.          No Known Allergies  Past Medical History  Diagnosis Date  . CHF (congestive heart failure)     ischemic  . 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 Dyslipidemia   . HTN (hypertension)   . Atrial fibrillation     ROS: Negative except as per HPI  BP 131/75  Pulse 64  Resp 18  Wt 215 lb (97.523 kg)  PHYSICAL EXAM: Pt is alert and oriented, obese male in NAD HEENT:  normal Neck: JVP - normal, carotids 2+= without bruits Lungs: CTA bilaterally CV: irregularly irregular without murmur or gallop Abd: soft, NT, obese, Positive BS Ext: no C/C/E, distal pulses intact and equal Skin: warm/dry no rash  EKG:  Atrial fibrillation 61 beats per minute, left IVCD. No significant ST or T wave abnormalities.  ASSESSMENT AND PLAN:

## 2011-06-07 ENCOUNTER — Other Ambulatory Visit: Payer: Self-pay | Admitting: Cardiovascular Disease

## 2011-06-07 NOTE — Telephone Encounter (Signed)
Refill request

## 2011-06-18 ENCOUNTER — Ambulatory Visit (INDEPENDENT_AMBULATORY_CARE_PROVIDER_SITE_OTHER): Payer: BC Managed Care – PPO | Admitting: *Deleted

## 2011-06-18 DIAGNOSIS — I4891 Unspecified atrial fibrillation: Secondary | ICD-10-CM

## 2011-07-20 ENCOUNTER — Ambulatory Visit (INDEPENDENT_AMBULATORY_CARE_PROVIDER_SITE_OTHER): Payer: BC Managed Care – PPO | Admitting: *Deleted

## 2011-07-20 DIAGNOSIS — I4891 Unspecified atrial fibrillation: Secondary | ICD-10-CM

## 2011-07-20 LAB — POCT INR: INR: 2.8

## 2011-08-17 ENCOUNTER — Ambulatory Visit (INDEPENDENT_AMBULATORY_CARE_PROVIDER_SITE_OTHER): Payer: BC Managed Care – PPO | Admitting: *Deleted

## 2011-08-17 DIAGNOSIS — I4891 Unspecified atrial fibrillation: Secondary | ICD-10-CM

## 2011-08-17 LAB — POCT INR: INR: 3.3

## 2011-09-18 ENCOUNTER — Ambulatory Visit (INDEPENDENT_AMBULATORY_CARE_PROVIDER_SITE_OTHER): Payer: BC Managed Care – PPO | Admitting: *Deleted

## 2011-09-18 DIAGNOSIS — I4891 Unspecified atrial fibrillation: Secondary | ICD-10-CM

## 2011-10-14 ENCOUNTER — Other Ambulatory Visit: Payer: Self-pay | Admitting: Cardiovascular Disease

## 2011-10-19 ENCOUNTER — Ambulatory Visit (INDEPENDENT_AMBULATORY_CARE_PROVIDER_SITE_OTHER): Payer: BC Managed Care – PPO | Admitting: *Deleted

## 2011-10-19 DIAGNOSIS — I4891 Unspecified atrial fibrillation: Secondary | ICD-10-CM

## 2011-10-19 LAB — POCT INR: INR: 2

## 2011-10-20 ENCOUNTER — Other Ambulatory Visit: Payer: Self-pay | Admitting: Cardiovascular Disease

## 2011-10-22 ENCOUNTER — Other Ambulatory Visit: Payer: Self-pay

## 2011-11-16 ENCOUNTER — Ambulatory Visit (INDEPENDENT_AMBULATORY_CARE_PROVIDER_SITE_OTHER): Payer: BC Managed Care – PPO | Admitting: *Deleted

## 2011-11-16 DIAGNOSIS — I4891 Unspecified atrial fibrillation: Secondary | ICD-10-CM

## 2011-11-16 LAB — POCT INR: INR: 1.7

## 2011-11-20 ENCOUNTER — Other Ambulatory Visit: Payer: Self-pay | Admitting: Cardiovascular Disease

## 2011-11-22 ENCOUNTER — Ambulatory Visit: Payer: BC Managed Care – PPO | Admitting: Cardiovascular Disease

## 2011-12-07 ENCOUNTER — Ambulatory Visit (INDEPENDENT_AMBULATORY_CARE_PROVIDER_SITE_OTHER): Payer: BC Managed Care – PPO | Admitting: *Deleted

## 2011-12-07 DIAGNOSIS — I4891 Unspecified atrial fibrillation: Secondary | ICD-10-CM

## 2011-12-07 LAB — POCT INR: INR: 2.5

## 2011-12-07 NOTE — Patient Instructions (Signed)
Reviewed leafy green vegetable list, sheet provided to patient.

## 2011-12-20 ENCOUNTER — Ambulatory Visit (INDEPENDENT_AMBULATORY_CARE_PROVIDER_SITE_OTHER): Payer: BC Managed Care – PPO | Admitting: Cardiovascular Disease

## 2011-12-20 ENCOUNTER — Encounter: Payer: Self-pay | Admitting: Cardiovascular Disease

## 2011-12-20 DIAGNOSIS — E785 Hyperlipidemia, unspecified: Secondary | ICD-10-CM

## 2011-12-20 DIAGNOSIS — I4891 Unspecified atrial fibrillation: Secondary | ICD-10-CM

## 2011-12-20 DIAGNOSIS — I25810 Atherosclerosis of coronary artery bypass graft(s) without angina pectoris: Secondary | ICD-10-CM

## 2011-12-20 NOTE — Progress Notes (Signed)
Patient ID: Alejandro [name], male   DOB: 22-Nov-1946, 66 y.o.   MRN: [id number] HPI:  This is a 66 year old gentleman with a history of coronary artery disease and ischemic cardiomyopathy. He has undergone multivessel coronary bypass surgery. He is also followed for atrial fibrillation. He failed cardioversion and has been managed with a strategy of rate control and anticoagulation.  From a symptomatic perspective, the patient is doing well. He denies chest pain or pressure. He denies dyspnea, edema, orthopnea, or PND. He has been on and off of cigarettes. He's not been following a diet, nor does he participate in any regular exercise.  Outpatient Encounter Prescriptions as of 12/20/2011  Medication Sig Dispense Refill  . aspirin 81 MG tablet Take 81 mg by mouth daily.        . carvedilol (COREG) 25 MG tablet TAKE ONE TABLET BY MOUTH TWICE A DAY  180 tablet  2  . furosemide (LASIX) 40 MG tablet Take 20 mg by mouth daily.       Marland Kitchen lisinopril (PRINIVIL,ZESTRIL) 20 MG tablet TAKE ONE TABLET BY MOUTH DAILY  90 tablet  1  . simvastatin (ZOCOR) 40 MG tablet TAKE ONE TABLET BY MOUTH DAILY AT BEDTIME  90 tablet  3  . warfarin (COUMADIN) 2 MG tablet USE AS DIRECTED BY ANTICOAGUALTION CLINIC  180 tablet  0    No Known Allergies  Past Medical History  Diagnosis Date  . CHF (congestive heart failure)     ischemic  . 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 Dyslipidemia   . HTN (hypertension)   . Atrial fibrillation     ROS: Negative except as per HPI  BP 140/72  Pulse 76  Ht 5\' 8"  (1.727 m)  Wt 99.519 kg (219 lb 6.4 oz)  BMI 33.36 kg/m2  PHYSICAL EXAM: Pt is alert and oriented, obese male in NAD HEENT: normal Neck: JVP - normal, carotids 2+= without bruits Lungs: CTA bilaterally CV: irregularly irregular without murmur or gallop Abd: soft, NT, Positive BS, no hepatomegaly Ext: no C/C/E, distal pulses intact and equal Skin: warm/dry no rash  EKG:  Atrial fibrillation 76  beats per minute, left anterior fascicular block, nonspecific ST and T wave abnormality.  ASSESSMENT AND PLAN:

## 2011-12-20 NOTE — Assessment & Plan Note (Signed)
The patient is due for repeat lipids and LFTs. He will return for fasting lab work. He is on simvastatin 40 mg daily.

## 2011-12-20 NOTE — Patient Instructions (Signed)
Your physician wants you to follow-up in: 6 MONTHS.  You will receive a reminder letter in the mail two months in advance. If you don't receive a letter, please call our office to schedule the follow-up appointment.  Your physician recommends that you continue on your current medications as directed. Please refer to the Current Medication list given to you today.  Your physician recommends that you return for a FASTING lipid, liver, BMP and CBC--nothing to eat or drink after midnight, lab opens at 8:30

## 2011-12-20 NOTE — Assessment & Plan Note (Signed)
The patient remains asymptomatic on his current medical therapy. He is anticoagulated with warfarin.

## 2011-12-20 NOTE — Assessment & Plan Note (Signed)
The patient is stable without angina. He continues on low-dose aspirin as well as warfarin. He is on appropriate medical therapy. He really has not taken good care of himself and I stressed the need to initiate an exercise program and watch his diet. He's gained 20 pounds over the past year and I want him to refocus on getting his weight back down under 200 pounds. I'll see him back in 6 months for followup. We discussed the importance of smoking cessation.

## 2011-12-28 ENCOUNTER — Other Ambulatory Visit: Payer: BC Managed Care – PPO | Admitting: *Deleted

## 2011-12-31 ENCOUNTER — Other Ambulatory Visit (INDEPENDENT_AMBULATORY_CARE_PROVIDER_SITE_OTHER): Payer: BC Managed Care – PPO | Admitting: *Deleted

## 2011-12-31 DIAGNOSIS — I25810 Atherosclerosis of coronary artery bypass graft(s) without angina pectoris: Secondary | ICD-10-CM

## 2011-12-31 DIAGNOSIS — I4891 Unspecified atrial fibrillation: Secondary | ICD-10-CM

## 2011-12-31 LAB — CBC WITH DIFFERENTIAL/PLATELET
Basophils Absolute: 0 10*3/uL (ref 0.0–0.1)
Eosinophils Relative: 4.7 % (ref 0.0–5.0)
Hemoglobin: 13.5 g/dL (ref 13.0–17.0)
Lymphocytes Relative: 14.3 % (ref 12.0–46.0)
Monocytes Relative: 6.6 % (ref 3.0–12.0)
Platelets: 230 10*3/uL (ref 150.0–400.0)
RDW: 13.4 % (ref 11.5–14.6)
WBC: 9.4 10*3/uL (ref 4.5–10.5)

## 2011-12-31 LAB — HEPATIC FUNCTION PANEL
ALT: 17 U/L (ref 0–53)
Albumin: 3.6 g/dL (ref 3.5–5.2)
Total Protein: 7.1 g/dL (ref 6.0–8.3)

## 2011-12-31 LAB — BASIC METABOLIC PANEL
CO2: 28 mEq/L (ref 19–32)
Chloride: 99 mEq/L (ref 96–112)
Sodium: 134 mEq/L — ABNORMAL LOW (ref 135–145)

## 2011-12-31 LAB — LIPID PANEL
Cholesterol: 79 mg/dL (ref 0–200)
HDL: 29.4 mg/dL — ABNORMAL LOW (ref >39.00)
Total CHOL/HDL Ratio: 3
Triglycerides: 64 mg/dL (ref 0.0–149.0)

## 2012-01-04 ENCOUNTER — Ambulatory Visit (INDEPENDENT_AMBULATORY_CARE_PROVIDER_SITE_OTHER): Payer: BC Managed Care – PPO

## 2012-01-04 DIAGNOSIS — I4891 Unspecified atrial fibrillation: Secondary | ICD-10-CM

## 2012-02-01 ENCOUNTER — Ambulatory Visit (INDEPENDENT_AMBULATORY_CARE_PROVIDER_SITE_OTHER): Payer: BC Managed Care – PPO

## 2012-02-01 DIAGNOSIS — I4891 Unspecified atrial fibrillation: Secondary | ICD-10-CM

## 2012-03-03 ENCOUNTER — Other Ambulatory Visit: Payer: Self-pay | Admitting: Cardiovascular Disease

## 2012-03-14 ENCOUNTER — Ambulatory Visit (INDEPENDENT_AMBULATORY_CARE_PROVIDER_SITE_OTHER): Payer: BC Managed Care – PPO

## 2012-03-14 DIAGNOSIS — I4891 Unspecified atrial fibrillation: Secondary | ICD-10-CM

## 2012-03-14 LAB — POCT INR: INR: 2.5

## 2012-04-05 ENCOUNTER — Other Ambulatory Visit: Payer: Self-pay | Admitting: Cardiovascular Disease

## 2012-04-18 ENCOUNTER — Other Ambulatory Visit: Payer: Self-pay | Admitting: Cardiovascular Disease

## 2012-04-25 ENCOUNTER — Ambulatory Visit (INDEPENDENT_AMBULATORY_CARE_PROVIDER_SITE_OTHER): Payer: BC Managed Care – PPO | Admitting: *Deleted

## 2012-04-25 DIAGNOSIS — I4891 Unspecified atrial fibrillation: Secondary | ICD-10-CM

## 2012-04-25 LAB — POCT INR: INR: 3

## 2012-06-06 ENCOUNTER — Ambulatory Visit (INDEPENDENT_AMBULATORY_CARE_PROVIDER_SITE_OTHER): Payer: BC Managed Care – PPO | Admitting: *Deleted

## 2012-06-06 DIAGNOSIS — I4891 Unspecified atrial fibrillation: Secondary | ICD-10-CM

## 2012-06-06 LAB — POCT INR: INR: 2.9

## 2012-06-09 ENCOUNTER — Other Ambulatory Visit: Payer: Self-pay | Admitting: Cardiovascular Disease

## 2012-06-16 ENCOUNTER — Other Ambulatory Visit: Payer: Self-pay | Admitting: Cardiovascular Disease

## 2012-06-16 NOTE — Telephone Encounter (Signed)
Fax Received. Refill Completed. Yerik Zeringue Chowoe (R.M.A)   

## 2012-07-14 ENCOUNTER — Telehealth: Payer: Self-pay | Admitting: Cardiovascular Disease

## 2012-07-14 NOTE — Telephone Encounter (Signed)
I spoke with the pt's wife and she states they went to the beach a couple of weeks ago and the pt did not put sunscreen on feet.  The pt got a severe burn.  The pt missed a whole week of work and could hardly walk. The pt does have blisters on his feet that have busted and his feet are swollen.  The pt's wife has been trying to get the pt to see the PCP but he refuses.  I made her aware that the pt should see his PCP for further evaluation.  The pt can develop an infection due to open sores or have cellulitis.  She will speak with the pt and try to arrange follow-up with PCP.

## 2012-07-14 NOTE — Telephone Encounter (Signed)
New msg Pt's wife called and said he got severe sunburn on his feet. She wanted to talk to you.

## 2012-07-18 ENCOUNTER — Ambulatory Visit (INDEPENDENT_AMBULATORY_CARE_PROVIDER_SITE_OTHER): Payer: BC Managed Care – PPO | Admitting: Pharmacist

## 2012-07-18 DIAGNOSIS — I4891 Unspecified atrial fibrillation: Secondary | ICD-10-CM

## 2012-07-25 ENCOUNTER — Encounter: Payer: Self-pay | Admitting: Cardiovascular Disease

## 2012-07-25 ENCOUNTER — Ambulatory Visit (INDEPENDENT_AMBULATORY_CARE_PROVIDER_SITE_OTHER): Payer: BC Managed Care – PPO | Admitting: Cardiovascular Disease

## 2012-07-25 VITALS — BP 133/81 | HR 73 | Resp 19 | Ht 69.0 in | Wt 215.4 lb

## 2012-07-25 DIAGNOSIS — I4891 Unspecified atrial fibrillation: Secondary | ICD-10-CM

## 2012-07-25 DIAGNOSIS — E785 Hyperlipidemia, unspecified: Secondary | ICD-10-CM

## 2012-07-25 DIAGNOSIS — I25810 Atherosclerosis of coronary artery bypass graft(s) without angina pectoris: Secondary | ICD-10-CM

## 2012-07-25 DIAGNOSIS — I5022 Chronic systolic (congestive) heart failure: Secondary | ICD-10-CM

## 2012-07-25 NOTE — Assessment & Plan Note (Signed)
This patient has chronic atrial fibrillation. He is managed with the strategy of rate control and anticoagulation. Will continue his same medications. His heart failure classes [employer] 1-2.

## 2012-07-25 NOTE — Assessment & Plan Note (Signed)
The patient is stable on appropriate medical therapy with carvedilol 25 mg twice daily and lisinopril 20 mg daily. He requires maintenance diuretic therapy. I will see him back in 6 months for followup.

## 2012-07-25 NOTE — Patient Instructions (Signed)
Your physician wants you to follow-up in: 6 MONTHS.  You will receive a reminder letter in the mail two months in advance. If you don't receive a letter, please call our office to schedule the follow-up appointment.  Your physician recommends that you continue on your current medications as directed. Please refer to the Current Medication list given to you today.  

## 2012-07-25 NOTE — Assessment & Plan Note (Signed)
Stable without anginal symptoms. Continue current medical program. 

## 2012-07-25 NOTE — Progress Notes (Signed)
   HPI:  Alejandro [name] returns for followup. He is a 66 year old gentleman with coronary artery disease status post multivessel CABG, cardiomyopathy, and atrial fibrillation. His last echocardiogram was in 2010 it was of poor image quality. His left ventricular function was thought to be in the low-normal range. He's been in atrial fibrillation now for some time and he is managed with the strategy of rate control and anticoagulation. Last lipids were checked in January 2013 and demonstrated a cholesterol of 79, HDL 29, and LDL of 37. Triglycerides were 64.  The patient has no complaints today. He specifically denies chest pain or pressure, dyspnea, edema, orthopnea, or PND. He does have generalized fatigue. He continues to work a fairly long hours. He's not engaged in any regular exercise.  Outpatient Encounter Prescriptions as of 07/25/2012  Medication Sig Dispense Refill  . aspirin 81 MG tablet Take 81 mg by mouth daily.        . carvedilol (COREG) 12.5 MG tablet TAKE 2 TABLETS TWICE A DAY  360 tablet  4  . furosemide (LASIX) 40 MG tablet Take 20 mg by mouth daily.       Marland Kitchen lisinopril (PRINIVIL,ZESTRIL) 20 MG tablet TAKE ONE TABLET BY MOUTH DAILY  90 tablet  1  . simvastatin (ZOCOR) 40 MG tablet TAKE ONE TABLET BY MOUTH DAILY AT BEDTIME  90 tablet  3  . warfarin (COUMADIN) 2 MG tablet USE AS DIRECTED BY ANTICOAGUALTION CLINIC  120 tablet  0  . DISCONTD: furosemide (LASIX) 80 MG tablet TAKE 1 TABLET EVERY DAY  90 tablet  1    No Known Allergies  Past Medical History  Diagnosis Date  . CHF (congestive heart failure)     ischemic  . 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 Dyslipidemia   . HTN (hypertension)   . Atrial fibrillation     ROS: Negative except as per HPI  BP 133/81  Pulse 73  Resp 19  Ht 5\' 9"  (1.753 m)  Wt 97.705 kg (215 lb 6.4 oz)  BMI 31.81 kg/m2  SpO2 92%  PHYSICAL EXAM: Pt is alert and oriented, obese male in NAD HEENT: normal Neck: JVP -  normal, carotids 2+= without bruits Lungs: CTA bilaterally CV: RRR without murmur or gallop Abd: soft, NT, Positive BS, obese Ext: no C/C/E, distal pulses intact and equal Skin: warm/dry no rash  EKG:  Atrial fibrillation 63 beats per minute, left anterior fascicular block, otherwise within normal limits the  ASSESSMENT AND PLAN:

## 2012-07-25 NOTE — Assessment & Plan Note (Signed)
Cholesterol is very low. He will remain on a statin drug in the setting of his coronary artery disease.

## 2012-08-15 ENCOUNTER — Ambulatory Visit (INDEPENDENT_AMBULATORY_CARE_PROVIDER_SITE_OTHER): Payer: BC Managed Care – PPO

## 2012-08-15 DIAGNOSIS — I4891 Unspecified atrial fibrillation: Secondary | ICD-10-CM

## 2012-08-15 LAB — POCT INR: INR: 3.6

## 2012-09-05 ENCOUNTER — Ambulatory Visit (INDEPENDENT_AMBULATORY_CARE_PROVIDER_SITE_OTHER): Payer: BC Managed Care – PPO | Admitting: *Deleted

## 2012-09-05 ENCOUNTER — Encounter: Payer: Self-pay | Admitting: *Deleted

## 2012-09-05 DIAGNOSIS — I4891 Unspecified atrial fibrillation: Secondary | ICD-10-CM

## 2012-09-05 LAB — POCT INR: INR: 2.4

## 2012-09-12 ENCOUNTER — Other Ambulatory Visit: Payer: Self-pay | Admitting: Cardiovascular Disease

## 2012-09-26 ENCOUNTER — Ambulatory Visit (INDEPENDENT_AMBULATORY_CARE_PROVIDER_SITE_OTHER): Payer: BC Managed Care – PPO | Admitting: Internal Medicine

## 2012-09-26 ENCOUNTER — Encounter: Payer: Self-pay | Admitting: Internal Medicine

## 2012-09-26 VITALS — BP 140/82 | HR 75 | Temp 98.7°F | Ht 67.25 in | Wt 215.0 lb

## 2012-09-26 DIAGNOSIS — K469 Unspecified abdominal hernia without obstruction or gangrene: Secondary | ICD-10-CM

## 2012-09-26 DIAGNOSIS — I5022 Chronic systolic (congestive) heart failure: Secondary | ICD-10-CM

## 2012-09-26 DIAGNOSIS — L989 Disorder of the skin and subcutaneous tissue, unspecified: Secondary | ICD-10-CM

## 2012-09-26 LAB — BASIC METABOLIC PANEL
BUN: 10 mg/dL (ref 6–23)
Chloride: 99 mEq/L (ref 96–112)
Creat: 0.99 mg/dL (ref 0.50–1.35)
Potassium: 4 mEq/L (ref 3.5–5.3)

## 2012-09-26 LAB — TSH: TSH: 1.279 u[IU]/mL (ref 0.350–4.500)

## 2012-09-26 NOTE — Assessment & Plan Note (Signed)
Large partially reducible hernia, see graphic. Hernia started shortly after umbilical hernia repair in 2010. At this point, the hernia is large enough that I would like surgery to see the patient. Incarceration symptoms discussed

## 2012-09-26 NOTE — Progress Notes (Signed)
  Subjective:    Patient ID: Alejandro [name], male    DOB: [birth year], 66 y.o.   MRN: [id number]  HPI New patient, here to get established History of CHF, good medication compliance. History of high cholesterol, good medication compliance Atrial fibrillation, on Coumadin,last INR therapeutic   Long history of a hernia in the abdomen, no actual abdominal pain but the skin of the hernia hurts from time to time. The hernia size increases on and off. Has a lesion on the nose for 6 months   Past Medical History  Diagnosis Date  . CHF (congestive heart failure)     ischemic  . 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 Dyslipidemia   . HTN (hypertension)   . Atrial fibrillation    Past Surgical History  Procedure Date  . Coronary artery bypass graft 12/24/2008    4 vessel  . Umbilical hernia repair 2010   Past Surgical History  Procedure Date  . Coronary artery bypass graft 12/24/2008    4 vessel  . Umbilical hernia repair 2010   Family History  Problem Relation Age of Onset  . Diabetes Mother   . Heart disease Mother   . Diabetes Father   . Heart disease Father   . Heart disease Brother     s/p CABG at 61  . Colon cancer Neg Hx   . Prostate cancer Neg Hx       Review of Systems No chest shortness of breath No nausea, vomiting, diarrhea No blood in the stools already had a flu shot.    Objective:   Physical Exam  HENT:  Head:    Abdominal:     General -- alert, well-developed, and overweight appearing. No apparent distress.  Lungs -- normal respiratory effort, no intercostal retractions, no accessory muscle use, and normal breath sounds.   Heart-- irreg   Extremities-- no pretibial edema bilaterally  Neurologic-- alert & oriented X3 and strength normal in all extremities. Psych-- Cognition and judgment appear intact. Alert and cooperative with normal attention span and concentration.  not anxious appearing and not depressed appearing.        Assessment & Plan:

## 2012-09-26 NOTE — Assessment & Plan Note (Signed)
BCC? Refer to dermatology, may need extensive surgery, we'll have to discuss with cardiology how to approach Coumadin.

## 2012-09-26 NOTE — Assessment & Plan Note (Signed)
Seems to control, check a BMP and a TSH, no change

## 2012-09-28 ENCOUNTER — Encounter: Payer: Self-pay | Admitting: Internal Medicine

## 2012-10-01 ENCOUNTER — Encounter: Payer: Self-pay | Admitting: *Deleted

## 2012-10-03 ENCOUNTER — Ambulatory Visit (INDEPENDENT_AMBULATORY_CARE_PROVIDER_SITE_OTHER): Payer: BC Managed Care – PPO

## 2012-10-03 DIAGNOSIS — I4891 Unspecified atrial fibrillation: Secondary | ICD-10-CM

## 2012-10-03 LAB — POCT INR: INR: 3.1

## 2012-10-15 ENCOUNTER — Other Ambulatory Visit: Payer: Self-pay

## 2012-10-16 ENCOUNTER — Other Ambulatory Visit: Payer: Self-pay | Admitting: Cardiovascular Disease

## 2012-10-22 ENCOUNTER — Ambulatory Visit (INDEPENDENT_AMBULATORY_CARE_PROVIDER_SITE_OTHER): Payer: BC Managed Care – PPO | Admitting: General Surgery

## 2012-10-22 ENCOUNTER — Other Ambulatory Visit (INDEPENDENT_AMBULATORY_CARE_PROVIDER_SITE_OTHER): Payer: Self-pay | Admitting: General Surgery

## 2012-10-22 ENCOUNTER — Encounter (INDEPENDENT_AMBULATORY_CARE_PROVIDER_SITE_OTHER): Payer: Self-pay | Admitting: General Surgery

## 2012-10-22 VITALS — BP 132/74 | HR 71 | Temp 98.0°F | Resp 18 | Ht 68.0 in | Wt 218.0 lb

## 2012-10-22 DIAGNOSIS — K432 Incisional hernia without obstruction or gangrene: Secondary | ICD-10-CM

## 2012-10-22 NOTE — Progress Notes (Signed)
Patient ID: Alejandro [name], male   DOB: [birth year], 66 y.o.   MRN: [id number]  Chief Complaint  Patient presents with  . Abdominal Pain    Evaluate hernia    HPI Alejandro [name] is a 66 y.o. male.   HPI  He is referred by Dr. Drue Novel for evaluation of a ventral hernia. In 2010, he had emergency repair of an incarcerated ventral hernia by Dr. Carolynne Edouard.  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 He has had a progressively increasing bulge and now has a large recurrent ventral hernia. He has no obstructive symptoms. It does not cause him a lot of pain. He has worn an abdominal binder in the past.  Past Medical History  Diagnosis Date  . CHF (congestive heart failure)     ischemic  . 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 Dyslipidemia   . HTN (hypertension)   . Atrial fibrillation     Past Surgical History  Procedure Date  . Coronary artery bypass graft 12/24/2008    4 vessel  . Umbilical hernia repair 2010  . Hernia repair 2012  . Coronary artery bypass graft 2012    Family History  Problem Relation Age of Onset  . Diabetes Mother   . Heart disease Mother   . Diabetes Father   . Heart disease Father   . Heart disease Brother     s/p CABG at 61  . Colon cancer Neg Hx   . Prostate cancer Neg Hx     Social History History  Substance Use Topics  . Smoking status: Current Every Day Smoker    Last Attempt to Quit: 12/21/2008  . Smokeless tobacco: Not on file     Comment: smoked 1/2-1 ppd; quit 12/21/2008, restarted, currently smoking  . Alcohol Use: Yes     Comment: 4-5  12oz beers daily    No Known Allergies  Current Outpatient Prescriptions  Medication Sig Dispense Refill  . aspirin 81 MG tablet Take 81 mg by mouth daily.        . carvedilol (COREG) 12.5 MG tablet TAKE 2 TABLETS TWICE A DAY  360 tablet  4  . furosemide (LASIX) 40 MG tablet Take 20 mg by mouth daily.       Marland Kitchen lisinopril (PRINIVIL,ZESTRIL) 20 MG tablet TAKE ONE TABLET BY MOUTH DAILY  90 tablet  1  .  simvastatin (ZOCOR) 40 MG tablet TAKE ONE TABLET BY MOUTH DAILY AT BEDTIME  90 tablet  3  . warfarin (COUMADIN) 2 MG tablet USE AS DIRECTED BY ANTICOAGUALTION CLINIC  120 tablet  1    Review of Systems Review of Systems  Constitutional: Negative.   Respiratory: Negative.   Cardiovascular: Negative.   Genitourinary: Negative for difficulty urinating.  Hematological: Bruises/bleeds easily.    Blood pressure 132/74, pulse 71, temperature 98 F (36.7 C), temperature source Temporal, resp. rate 18, height 5\' 8"  (1.727 m), weight 218 lb (98.884 kg).  Physical Exam Physical Exam  Constitutional:       Obese male in NAD.  HENT:  Head: Normocephalic and atraumatic.  Pulmonary/Chest: Effort normal and breath sounds normal.  Abdominal: Soft.       Obese.  Midline incision with very large bulge to the right of the incision that is not completely reducible due to possible loss of domain.    Data Reviewed Old chart  Assessment    Large, recurrent ventral incisional hernia-I am concerned about loss of domain.  No obstructive symptoms.  Original surgery was done by Dr. Carolynne Edouard.    Plan    Obtain CT of abdomen and pelvis.  Have him see Dr. Carolynne Edouard after the CT for another evaluation since he did his first hernia operation.       Allisyn Kunz J 10/22/2012, 10:51 AM

## 2012-10-22 NOTE — Patient Instructions (Signed)
Wear the abdominal binder.  Avoid lifting over 20 pounds.

## 2012-10-27 ENCOUNTER — Ambulatory Visit
Admission: RE | Admit: 2012-10-27 | Discharge: 2012-10-27 | Disposition: A | Discharge status: Home / Self Care | Payer: BC Managed Care – PPO | Source: Ambulatory Visit | Attending: General Surgery | Admitting: General Surgery

## 2012-10-27 DIAGNOSIS — K432 Incisional hernia without obstruction or gangrene: Secondary | ICD-10-CM

## 2012-10-27 MED ORDER — IOHEXOL 300 MG/ML  SOLN
125.0000 mL | Freq: Once | INTRAMUSCULAR | Status: AC | PRN
  Administered 2012-10-27: 125 mL via INTRAVENOUS

## 2012-10-28 ENCOUNTER — Ambulatory Visit (INDEPENDENT_AMBULATORY_CARE_PROVIDER_SITE_OTHER): Payer: BC Managed Care – PPO | Admitting: Pharmacist

## 2012-10-28 ENCOUNTER — Telehealth (INDEPENDENT_AMBULATORY_CARE_PROVIDER_SITE_OTHER): Payer: Self-pay

## 2012-10-28 DIAGNOSIS — I4891 Unspecified atrial fibrillation: Secondary | ICD-10-CM

## 2012-10-28 NOTE — Telephone Encounter (Signed)
Discussed the CT results with the pt's wife.  As stated in the report, recommended f/u chest CT in one year since the patient is an everyday smoker.  Also noted was a ventral hernia.   Pt has an appt with Dr. Carolynne Edouard on 11/10/12 to discuss the hernia.

## 2012-11-10 ENCOUNTER — Encounter (INDEPENDENT_AMBULATORY_CARE_PROVIDER_SITE_OTHER): Payer: Self-pay | Admitting: General Surgery

## 2012-11-10 ENCOUNTER — Ambulatory Visit (INDEPENDENT_AMBULATORY_CARE_PROVIDER_SITE_OTHER): Payer: BC Managed Care – PPO | Admitting: General Surgery

## 2012-11-10 ENCOUNTER — Encounter (INDEPENDENT_AMBULATORY_CARE_PROVIDER_SITE_OTHER): Payer: Self-pay

## 2012-11-10 VITALS — BP 158/92 | HR 69 | Temp 97.9°F | Ht 68.0 in | Wt 221.6 lb

## 2012-11-10 DIAGNOSIS — K432 Incisional hernia without obstruction or gangrene: Secondary | ICD-10-CM

## 2012-11-10 NOTE — Patient Instructions (Addendum)
Will get cardiac clearance Quit smoking

## 2012-11-11 NOTE — Progress Notes (Signed)
Subjective:     Patient ID: Alejandro [name], male   DOB: [birth year], 66 y.o.   MRN: [id number]  HPI The patient is a 66 year old white male who recently saw Dr. Abbey Chatters In our practice. He had a history of an incarcerated umbilical hernia that was repaired by me in March of 2010. The patient states that about 6 months to a year after that surgery he started noticing some bulging again at his bellybutton. Around the same time in 2010 he also had cardiac bypass surgery. Over the last 2 years he has had more bulging to the right of his umbilicus. He notes occasionally that he will have some mild discomfort associated with it. He denies any nausea or vomiting. The hernia is always soft but doesn't completely go back and anymore.  Review of Systems  Constitutional: Negative.   HENT: Negative.   Eyes: Negative.   Respiratory: Negative.   Cardiovascular: Negative.   Gastrointestinal: Positive for abdominal distention.  Genitourinary: Negative.   Musculoskeletal: Negative.   Skin: Negative.   Neurological: Negative.   Hematological: Bruises/bleeds easily.  Psychiatric/Behavioral: Negative.        Objective:   Physical Exam  Constitutional: He is oriented to person, place, and time. He appears well-developed and well-nourished.  HENT:  Head: Normocephalic and atraumatic.  Eyes: Conjunctivae normal and EOM are normal. Pupils are equal, round, and reactive to light.  Neck: Normal range of motion. Neck supple.  Cardiovascular: Normal rate, regular rhythm and normal heart sounds.   Pulmonary/Chest: Effort normal and breath sounds normal.  Abdominal: Soft. Bowel sounds are normal.       The patient has a large ventral hernia just to the right of his umbilicus. The hernia is soft with no sign of obstruction. The hernia does not completely reduce probably secondary to loss of domain. There is also some slight ulceration of the skin overlying the hernia  Musculoskeletal: Normal range of motion.   Neurological: He is alert and oriented to person, place, and time.  Skin: Skin is warm and dry.  Psychiatric: He has a normal mood and affect. His behavior is normal.       Assessment:     The patient has a large recurrent ventral hernia with possible loss of domain. Because the hernia will only continued to get larger and because of the ulceration of the skin overlying the hernia I suspect he is going to require repair of the hernia. I've discussed with him in detail the risks and benefits of the operation to fix the hernia as well as some of the technical aspects and he understands.    Plan:     The patient would like to think about this surgery for a little bit. In the meantime we will go ahead and get cardiac clearance from his heart doctors to make sure that it is safe for him to undergo general anesthesia a large abdominal operation. We will see him back in the next couple weeks to go over the details of the surgery again

## 2012-11-20 ENCOUNTER — Encounter: Payer: Self-pay | Admitting: Cardiovascular Disease

## 2012-11-21 ENCOUNTER — Telehealth: Payer: Self-pay | Admitting: Cardiovascular Disease

## 2012-11-21 NOTE — Telephone Encounter (Signed)
Pt's wife called because she said, pt needs to be clear for abdominal hernia repair ; also pt is scheduled to have skin cancer removal on both sides of his nose in January 20 th. Pt is on coumadin and  will need to be off this medication prior this procedure, because the surgeon does not know how deep the nose lesions  would be. Pt has a 6 months F/U visit with Dr. Excell Seltzer on 01/27/13.

## 2012-11-21 NOTE — Telephone Encounter (Signed)
plz return call to pt wife Alejandro [name] regarding previously requested Surgical clearance.

## 2012-11-27 ENCOUNTER — Other Ambulatory Visit: Payer: Self-pay | Admitting: Cardiovascular Disease

## 2012-11-27 NOTE — Telephone Encounter (Signed)
Note in Epic 12/19 - under 'letter' tab

## 2012-11-27 NOTE — Telephone Encounter (Signed)
Letter faxed to Dr. Chevis Pretty, pt. aware.

## 2012-11-28 ENCOUNTER — Ambulatory Visit (INDEPENDENT_AMBULATORY_CARE_PROVIDER_SITE_OTHER): Payer: BC Managed Care – PPO | Admitting: *Deleted

## 2012-11-28 DIAGNOSIS — I4891 Unspecified atrial fibrillation: Secondary | ICD-10-CM

## 2012-11-28 LAB — POCT INR: INR: 2.7

## 2012-12-02 ENCOUNTER — Other Ambulatory Visit (INDEPENDENT_AMBULATORY_CARE_PROVIDER_SITE_OTHER): Payer: Self-pay | Admitting: General Surgery

## 2012-12-09 ENCOUNTER — Encounter (INDEPENDENT_AMBULATORY_CARE_PROVIDER_SITE_OTHER): Payer: Self-pay

## 2012-12-26 ENCOUNTER — Ambulatory Visit (INDEPENDENT_AMBULATORY_CARE_PROVIDER_SITE_OTHER): Payer: BC Managed Care – PPO | Admitting: *Deleted

## 2012-12-26 DIAGNOSIS — I4891 Unspecified atrial fibrillation: Secondary | ICD-10-CM

## 2012-12-29 ENCOUNTER — Ambulatory Visit: Payer: BC Managed Care – PPO | Admitting: Internal Medicine

## 2013-01-16 ENCOUNTER — Encounter (HOSPITAL_COMMUNITY)
Admission: RE | Admit: 2013-01-16 | Discharge: 2013-01-16 | Disposition: A | Discharge status: Home / Self Care | Payer: BC Managed Care – PPO | Source: Ambulatory Visit | Attending: General Surgery | Admitting: General Surgery

## 2013-01-16 ENCOUNTER — Encounter (HOSPITAL_COMMUNITY): Payer: Self-pay

## 2013-01-16 ENCOUNTER — Ambulatory Visit (HOSPITAL_COMMUNITY)
Admission: RE | Admit: 2013-01-16 | Discharge: 2013-01-16 | Disposition: A | Discharge status: Home / Self Care | Payer: BC Managed Care – PPO | Source: Ambulatory Visit | Attending: General Surgery | Admitting: General Surgery

## 2013-01-16 ENCOUNTER — Encounter (HOSPITAL_COMMUNITY): Payer: Self-pay | Admitting: Pharmacy Technician

## 2013-01-16 DIAGNOSIS — Z951 Presence of aortocoronary bypass graft: Secondary | ICD-10-CM | POA: Insufficient documentation

## 2013-01-16 DIAGNOSIS — K439 Ventral hernia without obstruction or gangrene: Secondary | ICD-10-CM | POA: Insufficient documentation

## 2013-01-16 DIAGNOSIS — Z01812 Encounter for preprocedural laboratory examination: Secondary | ICD-10-CM | POA: Insufficient documentation

## 2013-01-16 DIAGNOSIS — I2510 Atherosclerotic heart disease of native coronary artery without angina pectoris: Secondary | ICD-10-CM | POA: Insufficient documentation

## 2013-01-16 DIAGNOSIS — Z01810 Encounter for preprocedural cardiovascular examination: Secondary | ICD-10-CM | POA: Insufficient documentation

## 2013-01-16 HISTORY — DX: Malignant (primary) neoplasm, unspecified: C80.1

## 2013-01-16 LAB — SURGICAL PCR SCREEN
MRSA, PCR: NEGATIVE
Staphylococcus aureus: POSITIVE — AB

## 2013-01-16 LAB — BASIC METABOLIC PANEL
Calcium: 9 mg/dL (ref 8.4–10.5)
GFR calc Af Amer: 71 mL/min — ABNORMAL LOW (ref >90)
GFR calc non Af Amer: 61 mL/min — ABNORMAL LOW (ref >90)
Glucose, Bld: 88 mg/dL (ref 70–99)
Potassium: 3.7 mEq/L (ref 3.5–5.1)
Sodium: 135 mEq/L (ref 135–145)

## 2013-01-16 LAB — PROTIME-INR
INR: 1.76 — ABNORMAL HIGH (ref 0.00–1.49)
Prothrombin Time: 19.9 seconds — ABNORMAL HIGH (ref 11.6–15.2)

## 2013-01-16 LAB — CBC
MCH: 30.7 pg (ref 26.0–34.0)
Platelets: 225 10*3/uL (ref 150–400)
RBC: 4.4 MIL/uL (ref 4.22–5.81)
WBC: 9.9 10*3/uL (ref 4.0–10.5)

## 2013-01-16 NOTE — Pre-Procedure Instructions (Signed)
DIONDRE PULIS  01/16/2013   Your procedure is scheduled on:  Thursday January 22, 2013  Report to [facility] at 0530 AM.  Call this number if you have problems the morning of surgery: [phone]   Remember:   Do not eat food or drink liquids after midnight.Wednesday   Take these medicines the morning of surgery with A SIP OF WATER: Carvedilol [Coreg ]   Do not wear jewelry.  Do not wear lotions, or powders. You may wear deodorant.  Do not shave 48 hours prior to surgery. Men may shave face and neck.  Do not bring valuables to the hospital.  Contacts, dentures or bridgework may not be worn into surgery.  Leave suitcase in the car. After surgery it may be brought to your room.  For patients admitted to the hospital, checkout time is 11:00 AM the day of  discharge.   Patients discharged the day of surgery will not be allowed to drive  home.    Special Instructions: Shower using CHG 2 nights before surgery and the night before surgery.  If you shower the day of surgery use CHG.  Use special wash - you have one bottle of CHG for all showers.  You should use approximately 1/3 of the bottle for each shower.   Please read over the following fact sheets that you were given: Pain Booklet, Coughing and Deep Breathing, MRSA Information and Surgical Site Infection Prevention

## 2013-01-21 MED ORDER — CEFAZOLIN SODIUM-DEXTROSE 2-3 GM-% IV SOLR
2.0000 g | INTRAVENOUS | Status: AC
  Administered 2013-01-22: 2 g via INTRAVENOUS
  Filled 2013-01-21 (×2): qty 50

## 2013-01-22 ENCOUNTER — Inpatient Hospital Stay (HOSPITAL_COMMUNITY)
Admission: RE | Admit: 2013-01-22 | Discharge: 2013-02-01 | DRG: 554 | Disposition: A | Discharge status: Home / Self Care | Payer: BC Managed Care – PPO | Source: Ambulatory Visit | Attending: General Surgery | Admitting: General Surgery

## 2013-01-22 ENCOUNTER — Ambulatory Visit (HOSPITAL_COMMUNITY): Payer: BC Managed Care – PPO | Admitting: Certified Registered"

## 2013-01-22 ENCOUNTER — Encounter (HOSPITAL_COMMUNITY): Payer: Self-pay | Admitting: Certified Registered"

## 2013-01-22 ENCOUNTER — Encounter (HOSPITAL_COMMUNITY)
Admission: RE | Disposition: A | Discharge status: Home / Self Care | Payer: Self-pay | Source: Ambulatory Visit | Attending: General Surgery

## 2013-01-22 ENCOUNTER — Encounter (HOSPITAL_COMMUNITY): Payer: Self-pay | Admitting: *Deleted

## 2013-01-22 DIAGNOSIS — Z7982 Long term (current) use of aspirin: Secondary | ICD-10-CM

## 2013-01-22 DIAGNOSIS — Y832 Surgical operation with anastomosis, bypass or graft as the cause of abnormal reaction of the patient, or of later complication, without mention of misadventure at the time of the procedure: Secondary | ICD-10-CM | POA: Diagnosis not present

## 2013-01-22 DIAGNOSIS — K560 Paralytic ileus: Secondary | ICD-10-CM | POA: Diagnosis not present

## 2013-01-22 DIAGNOSIS — K432 Incisional hernia without obstruction or gangrene: Principal | ICD-10-CM | POA: Diagnosis present

## 2013-01-22 DIAGNOSIS — I2510 Atherosclerotic heart disease of native coronary artery without angina pectoris: Secondary | ICD-10-CM | POA: Diagnosis present

## 2013-01-22 DIAGNOSIS — I5022 Chronic systolic (congestive) heart failure: Secondary | ICD-10-CM | POA: Diagnosis present

## 2013-01-22 DIAGNOSIS — Z951 Presence of aortocoronary bypass graft: Secondary | ICD-10-CM

## 2013-01-22 DIAGNOSIS — N35919 Unspecified urethral stricture, male, unspecified site: Secondary | ICD-10-CM | POA: Diagnosis present

## 2013-01-22 DIAGNOSIS — K929 Disease of digestive system, unspecified: Secondary | ICD-10-CM | POA: Diagnosis not present

## 2013-01-22 DIAGNOSIS — I10 Essential (primary) hypertension: Secondary | ICD-10-CM | POA: Diagnosis present

## 2013-01-22 DIAGNOSIS — Z85828 Personal history of other malignant neoplasm of skin: Secondary | ICD-10-CM

## 2013-01-22 DIAGNOSIS — Z79899 Other long term (current) drug therapy: Secondary | ICD-10-CM

## 2013-01-22 DIAGNOSIS — I4891 Unspecified atrial fibrillation: Secondary | ICD-10-CM | POA: Diagnosis present

## 2013-01-22 DIAGNOSIS — K439 Ventral hernia without obstruction or gangrene: Secondary | ICD-10-CM

## 2013-01-22 DIAGNOSIS — E876 Hypokalemia: Secondary | ICD-10-CM | POA: Diagnosis not present

## 2013-01-22 DIAGNOSIS — Y9210 Unspecified residential institution as the place of occurrence of the external cause: Secondary | ICD-10-CM | POA: Diagnosis not present

## 2013-01-22 DIAGNOSIS — Z7901 Long term (current) use of anticoagulants: Secondary | ICD-10-CM

## 2013-01-22 DIAGNOSIS — L98499 Non-pressure chronic ulcer of skin of other sites with unspecified severity: Secondary | ICD-10-CM | POA: Diagnosis present

## 2013-01-22 HISTORY — PX: CYSTOSCOPY: SHX5120

## 2013-01-22 HISTORY — PX: APPLICATION OF A-CELL OF CHEST/ABDOMEN: SHX6302

## 2013-01-22 HISTORY — PX: VENTRAL HERNIA REPAIR: SHX424

## 2013-01-22 LAB — CBC
HCT: 37.7 % — ABNORMAL LOW (ref 39.0–52.0)
Hemoglobin: 13.5 g/dL (ref 13.0–17.0)
MCV: 85.9 fL (ref 78.0–100.0)
Platelets: 203 10*3/uL (ref 150–400)
RBC: 4.39 MIL/uL (ref 4.22–5.81)
WBC: 13.1 10*3/uL — ABNORMAL HIGH (ref 4.0–10.5)

## 2013-01-22 LAB — GLUCOSE, CAPILLARY
Glucose-Capillary: 112 mg/dL — ABNORMAL HIGH (ref 70–99)
Glucose-Capillary: 128 mg/dL — ABNORMAL HIGH (ref 70–99)

## 2013-01-22 LAB — PROTIME-INR: Prothrombin Time: 14.3 seconds (ref 11.6–15.2)

## 2013-01-22 LAB — CREATININE, SERUM: GFR calc Af Amer: >90 mL/min (ref >90)

## 2013-01-22 SURGERY — REPAIR, HERNIA, VENTRAL
Anesthesia: General | Site: Abdomen | Wound class: Clean

## 2013-01-22 MED ORDER — PHENYLEPHRINE HCL 10 MG/ML IJ SOLN
10.0000 mg | INTRAVENOUS | Status: DC | PRN
  Administered 2013-01-22: 25 ug/min via INTRAVENOUS

## 2013-01-22 MED ORDER — ROCURONIUM BROMIDE 100 MG/10ML IV SOLN
INTRAVENOUS | Status: DC | PRN
  Administered 2013-01-22: 50 mg via INTRAVENOUS

## 2013-01-22 MED ORDER — CHLORHEXIDINE GLUCONATE 4 % EX LIQD: 1.0000 "application " | Freq: Once | CUTANEOUS | Status: DC

## 2013-01-22 MED ORDER — LIDOCAINE HCL (CARDIAC) 20 MG/ML IV SOLN
INTRAVENOUS | Status: DC | PRN
  Administered 2013-01-22: 60 mg via INTRAVENOUS

## 2013-01-22 MED ORDER — VECURONIUM BROMIDE 10 MG IV SOLR
INTRAVENOUS | Status: DC | PRN
  Administered 2013-01-22 (×3): 1 mg via INTRAVENOUS
  Administered 2013-01-22 (×2): 2 mg via INTRAVENOUS
  Administered 2013-01-22: 1 mg via INTRAVENOUS

## 2013-01-22 MED ORDER — MORPHINE SULFATE (PF) 1 MG/ML IV SOLN
INTRAVENOUS | Status: DC
  Administered 2013-01-22 (×2): via INTRAVENOUS
  Administered 2013-01-22: 9 mg via INTRAVENOUS
  Administered 2013-01-23: 10.5 mg via INTRAVENOUS
  Administered 2013-01-23: 7.5 mg via INTRAVENOUS
  Administered 2013-01-23: 3 mg via INTRAVENOUS
  Administered 2013-01-23: 12 mg via INTRAVENOUS
  Administered 2013-01-23: 1.5 mg via INTRAVENOUS
  Administered 2013-01-23: 9 mg via INTRAVENOUS
  Administered 2013-01-23: 15:00:00 via INTRAVENOUS
  Administered 2013-01-24: 1.5 mg via INTRAVENOUS
  Administered 2013-01-24: 6 mg via INTRAVENOUS
  Administered 2013-01-24: 3 mg via INTRAVENOUS
  Filled 2013-01-22 (×4): qty 25

## 2013-01-22 MED ORDER — MORPHINE SULFATE 4 MG/ML IJ SOLN: 4.0000 mg | INTRAMUSCULAR | Status: DC | PRN

## 2013-01-22 MED ORDER — METOCLOPRAMIDE HCL 5 MG/ML IJ SOLN: 10.0000 mg | Freq: Once | INTRAMUSCULAR | Status: DC | PRN

## 2013-01-22 MED ORDER — ONDANSETRON HCL 4 MG PO TABS: 4.0000 mg | ORAL_TABLET | Freq: Four times a day (QID) | ORAL | Status: DC | PRN

## 2013-01-22 MED ORDER — BUPIVACAINE-EPINEPHRINE 0.25% -1:200000 IJ SOLN
INTRAMUSCULAR | Status: AC
  Filled 2013-01-22: qty 1

## 2013-01-22 MED ORDER — 0.9 % SODIUM CHLORIDE (POUR BTL) OPTIME
TOPICAL | Status: DC | PRN
  Administered 2013-01-22: 1000 mL

## 2013-01-22 MED ORDER — OXYCODONE HCL 5 MG/5ML PO SOLN: 5.0000 mg | Freq: Once | ORAL | Status: DC | PRN

## 2013-01-22 MED ORDER — GLYCOPYRROLATE 0.2 MG/ML IJ SOLN
INTRAMUSCULAR | Status: DC | PRN
  Administered 2013-01-22: 0.2 mg via INTRAVENOUS
  Administered 2013-01-22: .6 mg via INTRAVENOUS

## 2013-01-22 MED ORDER — FUROSEMIDE 20 MG PO TABS
20.0000 mg | ORAL_TABLET | Freq: Every day | ORAL | Status: DC
  Administered 2013-01-22 – 2013-01-25 (×4): 20 mg via ORAL
  Filled 2013-01-22 (×5): qty 1

## 2013-01-22 MED ORDER — ONDANSETRON HCL 4 MG/2ML IJ SOLN
INTRAMUSCULAR | Status: DC | PRN
  Administered 2013-01-22: 4 mg via INTRAVENOUS

## 2013-01-22 MED ORDER — DEXAMETHASONE SODIUM PHOSPHATE 10 MG/ML IJ SOLN
INTRAMUSCULAR | Status: DC | PRN
  Administered 2013-01-22: 4 mg via INTRAVENOUS

## 2013-01-22 MED ORDER — ONDANSETRON HCL 4 MG/2ML IJ SOLN
4.0000 mg | Freq: Four times a day (QID) | INTRAMUSCULAR | Status: DC | PRN
  Filled 2013-01-22: qty 2

## 2013-01-22 MED ORDER — SODIUM CHLORIDE 0.9 % IJ SOLN: 9.0000 mL | INTRAMUSCULAR | Status: DC | PRN

## 2013-01-22 MED ORDER — PROPOFOL 10 MG/ML IV BOLUS
INTRAVENOUS | Status: DC | PRN
  Administered 2013-01-22: 150 mg via INTRAVENOUS

## 2013-01-22 MED ORDER — CARVEDILOL 25 MG PO TABS
25.0000 mg | ORAL_TABLET | Freq: Two times a day (BID) | ORAL | Status: DC
  Administered 2013-01-22 – 2013-01-25 (×7): 25 mg via ORAL
  Filled 2013-01-22 (×10): qty 1

## 2013-01-22 MED ORDER — STERILE WATER FOR IRRIGATION IR SOLN
Status: DC | PRN
  Administered 2013-01-22: 1000 mL

## 2013-01-22 MED ORDER — OXYCODONE HCL 5 MG PO TABS: 5.0000 mg | ORAL_TABLET | Freq: Once | ORAL | Status: DC | PRN

## 2013-01-22 MED ORDER — FENTANYL CITRATE 0.05 MG/ML IJ SOLN
INTRAMUSCULAR | Status: DC | PRN
  Administered 2013-01-22 (×5): 50 ug via INTRAVENOUS
  Administered 2013-01-22: 100 ug via INTRAVENOUS
  Administered 2013-01-22: 50 ug via INTRAVENOUS

## 2013-01-22 MED ORDER — DIPHENHYDRAMINE HCL 12.5 MG/5ML PO ELIX
12.5000 mg | ORAL_SOLUTION | Freq: Four times a day (QID) | ORAL | Status: DC | PRN
  Filled 2013-01-22: qty 5

## 2013-01-22 MED ORDER — PHENYLEPHRINE HCL 10 MG/ML IJ SOLN
INTRAMUSCULAR | Status: DC | PRN
  Administered 2013-01-22: 40 ug via INTRAVENOUS
  Administered 2013-01-22 (×2): 80 ug via INTRAVENOUS

## 2013-01-22 MED ORDER — ARTIFICIAL TEARS OP OINT
TOPICAL_OINTMENT | OPHTHALMIC | Status: DC | PRN
  Administered 2013-01-22: 1 via OPHTHALMIC

## 2013-01-22 MED ORDER — NEOSTIGMINE METHYLSULFATE 1 MG/ML IJ SOLN
INTRAMUSCULAR | Status: DC | PRN
  Administered 2013-01-22: 5 mg via INTRAVENOUS

## 2013-01-22 MED ORDER — METOPROLOL TARTRATE 1 MG/ML IV SOLN
5.0000 mg | Freq: Four times a day (QID) | INTRAVENOUS | Status: DC | PRN
  Administered 2013-01-26: 5 mg via INTRAVENOUS
  Filled 2013-01-22: qty 5

## 2013-01-22 MED ORDER — HYDROMORPHONE HCL PF 1 MG/ML IJ SOLN
0.2500 mg | INTRAMUSCULAR | Status: DC | PRN
  Administered 2013-01-22 (×2): 0.5 mg via INTRAVENOUS

## 2013-01-22 MED ORDER — ONDANSETRON HCL 4 MG/2ML IJ SOLN
4.0000 mg | Freq: Four times a day (QID) | INTRAMUSCULAR | Status: DC | PRN
  Administered 2013-01-24 – 2013-01-27 (×8): 4 mg via INTRAVENOUS
  Filled 2013-01-22 (×8): qty 2

## 2013-01-22 MED ORDER — SIMVASTATIN 40 MG PO TABS
40.0000 mg | ORAL_TABLET | Freq: Every evening | ORAL | Status: DC
  Administered 2013-01-22 – 2013-01-25 (×4): 40 mg via ORAL
  Filled 2013-01-22 (×5): qty 1

## 2013-01-22 MED ORDER — NALOXONE HCL 0.4 MG/ML IJ SOLN
0.4000 mg | INTRAMUSCULAR | Status: DC | PRN
  Filled 2013-01-22: qty 1

## 2013-01-22 MED ORDER — LACTATED RINGERS IV SOLN
INTRAVENOUS | Status: DC | PRN
  Administered 2013-01-22 (×2): via INTRAVENOUS

## 2013-01-22 MED ORDER — LISINOPRIL 20 MG PO TABS
20.0000 mg | ORAL_TABLET | Freq: Every day | ORAL | Status: DC
  Administered 2013-01-22 – 2013-01-25 (×4): 20 mg via ORAL
  Filled 2013-01-22 (×5): qty 1

## 2013-01-22 MED ORDER — HYDROMORPHONE HCL PF 1 MG/ML IJ SOLN
INTRAMUSCULAR | Status: AC
  Filled 2013-01-22: qty 1

## 2013-01-22 MED ORDER — BUPIVACAINE-EPINEPHRINE 0.25% -1:200000 IJ SOLN
INTRAMUSCULAR | Status: DC | PRN
  Administered 2013-01-22: 50 mL

## 2013-01-22 MED ORDER — MORPHINE SULFATE (PF) 1 MG/ML IV SOLN
INTRAVENOUS | Status: AC
  Administered 2013-01-22: 1.5 mg via INTRAVENOUS
  Filled 2013-01-22: qty 25

## 2013-01-22 MED ORDER — ENOXAPARIN SODIUM 30 MG/0.3ML ~~LOC~~ SOLN
30.0000 mg | SUBCUTANEOUS | Status: DC
  Administered 2013-01-23 – 2013-01-31 (×9): 30 mg via SUBCUTANEOUS
  Filled 2013-01-22 (×12): qty 0.3

## 2013-01-22 MED ORDER — MIDAZOLAM HCL 5 MG/5ML IJ SOLN
INTRAMUSCULAR | Status: DC | PRN
  Administered 2013-01-22: 1 mg via INTRAVENOUS

## 2013-01-22 MED ORDER — KCL IN DEXTROSE-NACL 20-5-0.9 MEQ/L-%-% IV SOLN
INTRAVENOUS | Status: AC
  Administered 2013-01-22 – 2013-01-23 (×2): via INTRAVENOUS
  Administered 2013-01-24: 75 mL/h via INTRAVENOUS
  Administered 2013-01-24 – 2013-01-28 (×6): via INTRAVENOUS
  Filled 2013-01-22 (×13): qty 1000

## 2013-01-22 MED ORDER — DIPHENHYDRAMINE HCL 50 MG/ML IJ SOLN
12.5000 mg | Freq: Four times a day (QID) | INTRAMUSCULAR | Status: DC | PRN
  Administered 2013-01-25: 12.5 mg via INTRAVENOUS
  Filled 2013-01-22: qty 1
  Filled 2013-01-22: qty 0.25

## 2013-01-22 SURGICAL SUPPLY — 64 items
BALLN NEPHROSTOMY (BALLOONS) ×3
BALLOON NEPHROSTOMY (BALLOONS) ×2 IMPLANT
BANDAGE GAUZE ELAST BULKY 4 IN (GAUZE/BANDAGES/DRESSINGS) ×3 IMPLANT
BINDER ABD UNIV 12 45-62 (WOUND CARE) ×2 IMPLANT
BINDER ABDOMINAL 46IN 62IN (WOUND CARE) ×3
BLADE SURG ROTATE 9660 (MISCELLANEOUS) IMPLANT
CANISTER SUCTION 2500CC (MISCELLANEOUS) ×3 IMPLANT
CATH COUDE FOLEY 5CC 14FR (CATHETERS) ×3 IMPLANT
CHLORAPREP W/TINT 26ML (MISCELLANEOUS) ×3 IMPLANT
CLOTH BEACON ORANGE TIMEOUT ST (SAFETY) ×3 IMPLANT
COVER SURGICAL LIGHT HANDLE (MISCELLANEOUS) ×3 IMPLANT
DRAIN CHANNEL 19F RND (DRAIN) ×3 IMPLANT
DRAPE CAMERA CLOSED 9X96 (DRAPES) ×3 IMPLANT
DRAPE LAPAROSCOPIC ABDOMINAL (DRAPES) ×3 IMPLANT
DRAPE UTILITY 15X26 W/TAPE STR (DRAPE) ×6 IMPLANT
ELECT CAUTERY BLADE 6.4 (BLADE) ×3 IMPLANT
ELECT REM PT RETURN 9FT ADLT (ELECTROSURGICAL) ×3
ELECTRODE REM PT RTRN 9FT ADLT (ELECTROSURGICAL) ×2 IMPLANT
EVACUATOR SILICONE 100CC (DRAIN) ×3 IMPLANT
GAUZE SPONGE 4X4 16PLY XRAY LF (GAUZE/BANDAGES/DRESSINGS) ×3 IMPLANT
GAUZE XEROFORM 5X9 LF (GAUZE/BANDAGES/DRESSINGS) ×3 IMPLANT
GLOVE BIO SURGEON STRL SZ7.5 (GLOVE) ×3 IMPLANT
GLOVE BIOGEL PI IND STRL 7.0 (GLOVE) ×2 IMPLANT
GLOVE BIOGEL PI IND STRL 8 (GLOVE) ×2 IMPLANT
GLOVE BIOGEL PI INDICATOR 7.0 (GLOVE) ×1
GLOVE BIOGEL PI INDICATOR 8 (GLOVE) ×1
GLOVE EUDERMIC 7 POWDERFREE (GLOVE) ×3 IMPLANT
GLOVE SS BIOGEL STRL SZ 6.5 (GLOVE) ×2 IMPLANT
GLOVE SUPERSENSE BIOGEL SZ 6.5 (GLOVE) ×1
GOWN BRE IMP PREV XXLGXLNG (GOWN DISPOSABLE) ×3 IMPLANT
GOWN STRL NON-REIN LRG LVL3 (GOWN DISPOSABLE) ×9 IMPLANT
KIT BASIN OR (CUSTOM PROCEDURE TRAY) ×3 IMPLANT
KIT ROOM TURNOVER OR (KITS) ×3 IMPLANT
MARKER SKIN DUAL TIP RULER LAB (MISCELLANEOUS) ×3 IMPLANT
MATRIX SURGICAL PSMT 16X20CM (Tissue) ×3 IMPLANT
NEEDLE HYPO 25GX1X1/2 BEV (NEEDLE) ×3 IMPLANT
NS IRRIG 1000ML POUR BTL (IV SOLUTION) ×3 IMPLANT
PACK GENERAL/GYN (CUSTOM PROCEDURE TRAY) ×3 IMPLANT
PAD ARMBOARD 7.5X6 YLW CONV (MISCELLANEOUS) ×6 IMPLANT
SET CYSTO W/LG BORE CLAMP LF (SET/KITS/TRAYS/PACK) ×3 IMPLANT
SLEEVE ENDOPATH XCEL 5M (ENDOMECHANICALS) ×6 IMPLANT
SOLUTION ANTI FOG 6CC (MISCELLANEOUS) ×3 IMPLANT
SPECIMEN JAR LARGE (MISCELLANEOUS) ×3 IMPLANT
SPONGE GAUZE 4X4 12PLY (GAUZE/BANDAGES/DRESSINGS) ×3 IMPLANT
STAPLER VISISTAT 35W (STAPLE) ×3 IMPLANT
SUT ETHILON 3 0 FSL (SUTURE) ×3 IMPLANT
SUT NOVA 1 T20/GS 25DT (SUTURE) ×9 IMPLANT
SUT NOVA NAB DX-16 0-1 5-0 T12 (SUTURE) IMPLANT
SUT PROLENE 1 CT (SUTURE) IMPLANT
SUT SILK 2 0 (SUTURE) ×1
SUT SILK 2-0 18XBRD TIE 12 (SUTURE) ×2 IMPLANT
SUT VIC AB 3-0 54X BRD REEL (SUTURE) IMPLANT
SUT VIC AB 3-0 BRD 54 (SUTURE)
SUT VIC AB 3-0 SH 27 (SUTURE)
SUT VIC AB 3-0 SH 27XBRD (SUTURE) IMPLANT
SYR CONTROL 10ML LL (SYRINGE) ×3 IMPLANT
TACKER 5MM HERNIA 3.5CML NAB (ENDOMECHANICALS) ×3 IMPLANT
TAPE CLOTH SURG 6X10 WHT LF (GAUZE/BANDAGES/DRESSINGS) ×3 IMPLANT
TOWEL OR 17X24 6PK STRL BLUE (TOWEL DISPOSABLE) ×3 IMPLANT
TOWEL OR 17X26 10 PK STRL BLUE (TOWEL DISPOSABLE) ×3 IMPLANT
TRAY FOLEY CATH 14FRSI W/METER (CATHETERS) IMPLANT
TROCAR XCEL NON-BLD 5MMX100MML (ENDOMECHANICALS) ×3 IMPLANT
TUBING INSUFFLATION 10FT LAP (TUBING) ×3 IMPLANT
WATER STERILE IRR 1000ML POUR (IV SOLUTION) IMPLANT

## 2013-01-22 NOTE — Op Note (Signed)
01/22/2013  10:26 AM  PATIENT:  Alejandro [name]  67 y.o. male  PRE-OPERATIVE DIAGNOSIS:  Ventral Hernia, Difficult foley catheter placement  POST-OPERATIVE DIAGNOSIS:  Ventral Hernia, Bulbar Urethral Stricture  PROCEDURE:  Procedure(s) with comments: HERNIA REPAIR VENTRAL ADULT (N/A) CYSTOSCOPY (N/A) - Cystoscopy with balloon dilation. Insertion of coude catheter. APPLICATION OF A-CELL OF CHEST/ABDOMEN (N/A)  SURGEON:  Surgeon(s) and Role: Panel 1:    * Robyne Askew, MD - Primary    * Christian Leta Jungling, MD - Assisting    * Garnett Farm, MD  Panel 2:    * Garnett Farm, MD - Primary  PHYSICIAN ASSISTANT:   ASSISTANTS: Dr. Jamey Ripa   ANESTHESIA:   general  EBL:  Total I/O In: 1000 [I.V.:1000] Out: 500 [Urine:500]  BLOOD ADMINISTERED:none  DRAINS: (1) Jackson-Pratt drain(s) with closed bulb suction in the subq   LOCAL MEDICATIONS USED:  MARCAINE     SPECIMEN:  Source of Specimen:  hernia sac  DISPOSITION OF SPECIMEN:  PATHOLOGY  COUNTS:  YES  TOURNIQUET:  * No tourniquets in log *  DICTATION: .Dragon Dictation After informed consent was obtained the patient was brought to the operating room and placed in the supine position on the operating room table. After adequate induction of general anesthesia the patient's abdomen was prepped with ChloraPrep, allowed to dry, and draped in usual sterile manner. A site was chosen in the left upper quadrant for accessing the abdominal cavity. This area was infiltrated with quarter Marcaine. A small stab incision was made with a 15 blade knife. A 5 mm Optiview port was used to bluntly dissected the layers of the abdominal wall under direct vision until access was gained to the abdominal cavity. The abdomen was then insufflated with carbon dioxide without difficulty. A second 5 mm port was placed on the left abdomen under direct vision without difficulty. After insufflation the bowel contained in the hernia sac reduced easily into the  abdominal cavity. There were 2 small areas of adherent omentum to the edges of the hernia sac. These were taken down with blunt dissection without difficulty. Next the redundant and ulcerated skin overlying the hernia was excised sharply with the 15 blade knife and electrocautery. The hernia sac was also excised sharply with the electrocautery down to the fascial edge. Once this was accomplished we could evaluate the edges of the hernia. The fascial edges of the hernia were healthy-appearing. It appeared as though the fascial defect we closed this in a transverse manner. At this point we chose to use a 16 x 20 cm piece of Acell biologic mesh. The biologic mesh was oriented appropriately after being hydrated for 30 minutes. The mesh was then placed intra-abdominal and and anchored in approximately 8 locations with a full thickness #1 Novafil U stitch. Next the fascial edges were then reapproximated with #1 Novafil figure-of-eight stitches. A small stab incision was made lateral and inferior to the operative bed with a 15 blade knife. The tonsil clamp was placed through this opening into the operative bed and used to bring a 19 [place] round Blake drain into the operative bed. The drain was anchored to the skin with a 3-0 nylon stitch. The drain was placed in the subcutaneous space. The skin was then closed over the drain with staples. Next the abdomen was then reinsufflated. A Pro tacking device was used to place tacks between the anchoring stitches so that there were no gaps between the biologic mesh and the abdominal  wall. Once this was accomplished the mesh appeared to be in good position and the hernia appeared to be well repaired. At this point the gas was allowed to escape and the ports were removed. The port sites were closed with staples. The drain was placed to bulb suction and there was a good seal. Sterile dressings were applied. The patient tolerated the procedure well. At the end of the case all needle  sponge and instrument counts were correct. The patient was then awakened and taken to recovery in stable condition.  PLAN OF CARE: Admit to inpatient   PATIENT DISPOSITION:  PACU - hemodynamically stable.   Delay start of Pharmacological VTE agent (>24hrs) due to surgical blood loss or risk of bleeding: no

## 2013-01-22 NOTE — H&P (Signed)
Alejandro [name] Description: 67 year old male  11/10/2012 4:50 PM Office Visit Provider: Robyne Askew, MD  MRN: [id number] Department: [facility]            Diagnoses Reason for Visit   Recurrent ventral hernia - Primary  Follow-up   553.21  discuss CT results           Current Vitals - Last Recorded    BP Pulse Temp(Src) Ht Wt BMI   158/92 69 97.9 F (36.6 C) (Temporal) 5\' 8"  (1.727 m) 221 lb 9.6 oz (100.517 kg) 33.7 kg/m2       SpO2             95%                 Progress Notes    Robyne Askew, MD at 11/11/2012 8:55 AM    Status: Signed             Subjective:     Patient ID: Alejandro [name], male DOB: [birth year], 67 y.o. MRN: [id number]  HPI  The patient is a 67 year old white male who recently saw Dr. Abbey Chatters In our practice. He had a history of an incarcerated umbilical hernia that was repaired by me in March of 2010. The patient states that about 6 months to a year after that surgery he started noticing some bulging again at his bellybutton. Around the same time in 2010 he also had cardiac bypass surgery. Over the last 2 years he has had more bulging to the right of his umbilicus. He notes occasionally that he will have some mild discomfort associated with it. He denies any nausea or vomiting. The hernia is always soft but doesn't completely go back and anymore.  Review of Systems  Constitutional: Negative.  HENT: Negative.  Eyes: Negative.  Respiratory: Negative.  Cardiovascular: Negative.  Gastrointestinal: Positive for abdominal distention.  Genitourinary: Negative.  Musculoskeletal: Negative.  Skin: Negative.  Neurological: Negative.  Hematological: Bruises/bleeds easily.  Psychiatric/Behavioral: Negative.     Objective:     Physical Exam  Constitutional: He is oriented to person, place, and time. He appears well-developed and well-nourished.  HENT:  Head: Normocephalic and atraumatic.  Eyes: Conjunctivae normal and  EOM are normal. Pupils are equal, round, and reactive to light.  Neck: Normal range of motion. Neck supple.  Cardiovascular: Normal rate, regular rhythm and normal heart sounds.  Pulmonary/Chest: Effort normal and breath sounds normal.  Abdominal: Soft. Bowel sounds are normal.  The patient has a large ventral hernia just to the right of his umbilicus. The hernia is soft with no sign of obstruction. The hernia does not completely reduce probably secondary to loss of domain. There is also some slight ulceration of the skin overlying the hernia  Musculoskeletal: Normal range of motion.  Neurological: He is alert and oriented to person, place, and time.  Skin: Skin is warm and dry.  Psychiatric: He has a normal mood and affect. His behavior is normal.     Assessment:     The patient has a large recurrent ventral hernia with possible loss of domain. Because the hernia will only continued to get larger and because of the ulceration of the skin overlying the hernia I suspect he is going to require repair of the hernia. I've discussed with him in detail the risks and benefits of the operation to fix the hernia as well as some of  the technical aspects and he understands.     Plan:     The patient would like to think about this surgery for a little bit. In the meantime we will go ahead and get cardiac clearance from his heart doctors to make sure that it is safe for him to undergo general anesthesia a large abdominal operation. We will see him back in the next couple weeks to go over the details of the surgery again       After much consideration he has decided to have a ventral hernia repair with mesh. I have discussed with him the risks and benefits of surgery as well as some of the technical aspects and he understands and wishes to proceed.        Not recorded                            Patient Instructions    Will get cardiac clearance  Quit smoking        Patient  Instructions History Recorded         Level of Service Follow-up and Disposition   PR OFFICE OUTPATIENT VISIT 25 MINUTES [99214]  Return in about 3 weeks (around 12/01/2012).           All Flowsheet Templates (all recorded)    Encounter Vitals Flowsheet   Custom Formula Data Flowsheet   Anthropometrics Flowsheet                        Referring Provider    Wanda Plump, MD            All Charges for This Encounter    Code Description Service Date Service Provider Modifiers Quantity   [phone] PR OFFICE OUTPATIENT VISIT 25 MINUTES 11/10/2012 Robyne Askew, MD  1               Other Encounter Related Information    Allergies & Medications      Problem List      History      Patient-Entered Questionnaires      AVS Reports    Date/Time Report Action User   11/10/2012 4:59 PM After Visit Summary Printed Robyne Askew, MD   11/10/2012 5:05 PM After Visit Summary Printed Maryan Puls, CMA          No data filed

## 2013-01-22 NOTE — Transfer of Care (Signed)
Immediate Anesthesia Transfer of Care Note  Patient: Alejandro [name]  Procedure(s) Performed: Procedure(s) with comments: HERNIA REPAIR VENTRAL ADULT (N/A) CYSTOSCOPY (N/A) - Cystoscopy with balloon dilation. Insertion of coude catheter. APPLICATION OF A-CELL OF CHEST/ABDOMEN (N/A)  Patient Location: PACU  Anesthesia Type:General  Level of Consciousness: awake, alert  and oriented  Airway & Oxygen Therapy: Patient Spontanous Breathing and Patient connected to nasal cannula oxygen  Post-op Assessment: Report given to PACU RN, Post -op Vital signs reviewed and stable and Patient moving all extremities X 4  Post vital signs: Reviewed and stable  Complications: No apparent anesthesia complications

## 2013-01-22 NOTE — Anesthesia Preprocedure Evaluation (Addendum)
Anesthesia Evaluation  Patient identified by MRN, date of birth, ID band Patient awake    Reviewed: Allergy & Precautions, H&P , NPO status , Patient's Chart, lab work & pertinent test results, reviewed documented beta blocker date and time   Airway Mallampati: II TM Distance: >3 FB Neck ROM: full    Dental  (+) Edentulous Upper and Edentulous Lower   Pulmonary neg pulmonary ROS,  breath sounds clear to auscultation        Cardiovascular hypertension, On Medications and On Home Beta Blockers + CAD, + CABG and +CHF + dysrhythmias Atrial Fibrillation + Valvular Problems/Murmurs MR Rhythm:regular     Neuro/Psych negative neurological ROS  negative psych ROS   GI/Hepatic negative GI ROS, Neg liver ROS,   Endo/Other  negative endocrine ROS  Renal/GU negative Renal ROS  negative genitourinary   Musculoskeletal   Abdominal   Peds  Hematology negative hematology ROS (+)   Anesthesia Other Findings See surgeon's H&P   Reproductive/Obstetrics negative OB ROS                          Anesthesia Physical Anesthesia Plan  ASA: III  Anesthesia Plan: General   Post-op Pain Management:    Induction: Intravenous  Airway Management Planned: Oral ETT  Additional Equipment:   Intra-op Plan:   Post-operative Plan: Extubation in OR  Informed Consent: I have reviewed the patients History and Physical, chart, labs and discussed the procedure including the risks, benefits and alternatives for the proposed anesthesia with the patient or authorized representative who has indicated his/her understanding and acceptance.   Dental Advisory Given  Plan Discussed with: CRNA and Surgeon  Anesthesia Plan Comments:         Anesthesia Quick Evaluation

## 2013-01-22 NOTE — Interval H&P Note (Signed)
History and Physical Interval Note:  01/22/2013 6:30 AM  Alejandro [name]  has presented today for surgery, with the diagnosis of ventral hernia  The various methods of treatment have been discussed with the patient and family. After consideration of risks, benefits and other options for treatment, the patient has consented to  Procedure(s): HERNIA REPAIR VENTRAL ADULT (N/A) INSERTION OF MESH (N/A) as a surgical intervention .  The patient's history has been reviewed, patient examined, no change in status, stable for surgery.  I have reviewed the patient's chart and labs.  Questions were answered to the patient's satisfaction.     TOTH III,Timohty Renbarger S

## 2013-01-22 NOTE — Op Note (Signed)
PATIENT:  Alejandro [name]  PRE-OPERATIVE DIAGNOSIS: Difficult Foley catheter placement  POST-OPERATIVE DIAGNOSIS: 1. Bulbar urethral stricture 2. Difficult Foley catheter placement  PROCEDURE: 1. Cystoscopy with balloon dilatation of bulbar urethral stricture. 2. Foley catheter placement  SURGEON:  [place]  INDICATION: [name] is a 67 year old who is seen in consultation in the operating room for Foley catheter placement. No history could be obtained as the patient was intubated however he had given no history of voiding symptoms, urethral instrumentation or passed infections. Attempts at placing catheters were unsuccessful and I was contacted for Foley catheter placement.  ANESTHESIA:  General  EBL:  Minimal  DRAINS: 14 French coud catheter  Description of procedure: The patient was seen in the operating room on the OR table in the supine position. He was noted to be uncircumcised and did have mild phimosis but had a very adequate opening. The glans could be easily visualized as could the urethral meatus.  Flexible cystoscopy was undertaken using the 17 French flexible cystoscope. Sterile technique was used. The scope was passed under direct vision down the urethra which is noted to be entirely normal until I reached the bulbar urethra where a pinpoint bulbar urethral stricture was identified. A 0.038 inch floppy tip guidewire was then passed through the cystoscope, through the area of stricture and into the bladder. It was left in place and the cystoscope was removed.  The UroMax nephrostomy dilating balloon was then passed over the guidewire and across the area of stricture. Was then inflated, deflated and removed. Repeat cystoscopy revealed the stricture was completely dilated and allowed passage of the scope beyond this area which was noted to be normal. The prostate was free of lesions and the bladder was then entered. The ureteral orifices were noted to be of normal  configuration and position. There was 1+ trabeculation. No tumors, stones or inflammatory lesions were noted within the bladder.  A 14 French coud catheter was then passed into the bladder without difficulty, the balloon filled and the catheter connected to closed system drainage.  The patient tolerated this portion of the procedure well with no intraoperative complications. I advised Dr. Carolynne Edouard that the catheter should be left indwelling long enough to monitor urine output and when this was no longer necessary could be removed. The patient should have no further difficulty as his stricture has now been dilated.

## 2013-01-22 NOTE — Anesthesia Postprocedure Evaluation (Signed)
Anesthesia Post Note  Patient: Alejandro [name]  Procedure(s) Performed: Procedure(s) (LRB): HERNIA REPAIR VENTRAL ADULT (N/A) CYSTOSCOPY (N/A) APPLICATION OF A-CELL OF CHEST/ABDOMEN (N/A)  Anesthesia type: General  Patient location: PACU  Post pain: Pain level controlled  Post assessment: Patient's Cardiovascular Status Stable  Last Vitals:  Filed Vitals:   01/22/13 1224  BP: 184/87  Pulse: 94  Temp: 37 C  Resp: 17    Post vital signs: Reviewed and stable  Level of consciousness: alert  Complications: No apparent anesthesia complications

## 2013-01-23 ENCOUNTER — Encounter (HOSPITAL_COMMUNITY): Payer: Self-pay | Admitting: General Surgery

## 2013-01-23 ENCOUNTER — Telehealth (INDEPENDENT_AMBULATORY_CARE_PROVIDER_SITE_OTHER): Payer: Self-pay | Admitting: General Surgery

## 2013-01-23 LAB — BASIC METABOLIC PANEL
BUN: 19 mg/dL (ref 6–23)
Calcium: 8.6 mg/dL (ref 8.4–10.5)
GFR calc Af Amer: 76 mL/min — ABNORMAL LOW (ref >90)
GFR calc non Af Amer: 65 mL/min — ABNORMAL LOW (ref >90)
Glucose, Bld: 133 mg/dL — ABNORMAL HIGH (ref 70–99)
Sodium: 134 mEq/L — ABNORMAL LOW (ref 135–145)

## 2013-01-23 LAB — CBC
MCH: 30.4 pg (ref 26.0–34.0)
MCHC: 35 g/dL (ref 30.0–36.0)
Platelets: 213 10*3/uL (ref 150–400)
RBC: 4.15 MIL/uL — ABNORMAL LOW (ref 4.22–5.81)

## 2013-01-23 MED ORDER — WARFARIN - PHYSICIAN DOSING INPATIENT: Freq: Every day | Status: DC

## 2013-01-23 MED ORDER — WARFARIN SODIUM 2 MG PO TABS
2.0000 mg | ORAL_TABLET | Freq: Every day | ORAL | Status: DC
  Administered 2013-01-23 – 2013-01-25 (×3): 2 mg via ORAL
  Filled 2013-01-23 (×4): qty 1

## 2013-01-23 NOTE — Progress Notes (Signed)
1 Day Post-Op  Subjective: Only complains of soreness when he coughs. Denies nausea  Objective: Vital signs in last 24 hours: Temp:  [97.5 F (36.4 C)-98.3 F (36.8 C)] 97.5 F (36.4 C) (02/21 1149) Pulse Rate:  [41-91] 91 (02/21 1200) Resp:  [12-22] 17 (02/21 1434) BP: (110-161)/(56-93) 134/56 mmHg (02/21 1200) SpO2:  [94 %-100 %] 100 % (02/21 1434) Last BM Date:  (pta)  Intake/Output from previous day: 02/20 0701 - 02/21 0700 In: 3516.3 [P.O.:240; I.V.:3276.3] Out: 2455 [Urine:2375; Drains:80] Intake/Output this shift: Total I/O In: 855 [P.O.:480; I.V.:375] Out: 395 [Urine:325; Drains:70]  GI: soft, mild tenderness. few bs. drain output serosanguinous  Lab Results:   Recent Labs  01/22/13 1249 01/23/13 0436  WBC 13.1* 15.4*  HGB 13.5 12.6*  HCT 37.7* 36.0*  PLT 203 213   BMET  Recent Labs  01/22/13 1249 01/23/13 0436  NA  --  134*  K  --  4.9  CL  --  99  CO2  --  27  GLUCOSE  --  133*  BUN  --  19  CREATININE 0.93 1.13  CALCIUM  --  8.6   PT/INR  Recent Labs  01/22/13 0559  LABPROT 14.3  INR 1.13   ABG No results found for this basename: PHART, PCO2, PO2, HCO3,  in the last 72 hours  Studies/Results: No results found.  Anti-infectives: Anti-infectives   Start     Dose/Rate Route Frequency Ordered Stop   01/22/13 0600  ceFAZolin (ANCEF) IVPB 2 g/50 mL premix     2 g 100 mL/hr over 30 Minutes Intravenous On call to O.R. 01/21/13 1335 01/22/13 0800      Assessment/Plan: s/p Procedure(s) with comments: HERNIA REPAIR VENTRAL ADULT (N/A) CYSTOSCOPY (N/A) - Cystoscopy with balloon dilation. Insertion of coude catheter. APPLICATION OF A-CELL OF CHEST/ABDOMEN (N/A) Will continue clears until more reliable bowel function returns Will restart coumadin OOB  LOS: 1 day    TOTH III,Tate Jerkins S 01/23/2013

## 2013-01-23 NOTE — Progress Notes (Signed)
Utilization Review Completed. 01/23/2013  

## 2013-01-23 NOTE — Telephone Encounter (Signed)
Spoke with patient wife patient has not be dc from the hospital as of  01/23/13

## 2013-01-23 NOTE — Progress Notes (Signed)
PHARMACIST - PHYSICIAN COMMUNICATION DR:  Carolynne Edouard CONCERNING: Pharmacy Care Issues Regarding Warfarin Labs  RECOMMENDATION (Action Taken): A baseline and daily protime for three days has been ordered to meet the [facility] Patient safety goal and comply with the current [facility] Pharmacy & Therapeutics Committee policy.   The Pharmacy will defer all warfarin dose order changes and follow up of lab results to the prescriber unless an additional order to initiate a "pharmacy Coumadin consult" is placed.  DESCRIPTION:  While hospitalized, to be in compliance with The Joint Commission National Patient Safety Goals, all patients on warfarin must have a baseline and/or current protime prior to the administration of warfarin. Pharmacy has received your order for warfarin without these required laboratory assessments.  Celedonio Miyamoto, PharmD, BCPS Clinical Pharmacist Pager [phone]

## 2013-01-24 MED ORDER — MORPHINE SULFATE (PF) 1 MG/ML IV SOLN
INTRAVENOUS | Status: DC
  Administered 2013-01-24: 6 mg via INTRAVENOUS
  Administered 2013-01-24 (×2): 2 mg via INTRAVENOUS
  Administered 2013-01-25: 10 mg via INTRAVENOUS
  Administered 2013-01-25: 8 mL via INTRAVENOUS
  Administered 2013-01-25: 7 mL via INTRAVENOUS
  Administered 2013-01-25: 3 mg via INTRAVENOUS
  Administered 2013-01-25: via INTRAVENOUS
  Administered 2013-01-25: 4 mg via INTRAVENOUS
  Administered 2013-01-26: 1 mg via INTRAVENOUS
  Administered 2013-01-26: 6 mg via INTRAVENOUS
  Administered 2013-01-27: 5 mg via INTRAVENOUS
  Administered 2013-01-27: 3 mg via INTRAVENOUS
  Administered 2013-01-27 – 2013-01-28 (×2): 1 mg via INTRAVENOUS
  Administered 2013-01-28: 3 mg via INTRAVENOUS
  Administered 2013-01-28: 4 mg via INTRAVENOUS
  Administered 2013-01-28: 16:00:00 via INTRAVENOUS
  Administered 2013-01-28: 1 mg via INTRAVENOUS
  Administered 2013-01-29: 2 mg via INTRAVENOUS
  Administered 2013-01-29: 1 mg via INTRAVENOUS
  Administered 2013-01-29: 2 mg via INTRAVENOUS
  Administered 2013-01-29: 3 mg via INTRAVENOUS
  Administered 2013-01-30: 1 mg via INTRAVENOUS
  Filled 2013-01-24 (×3): qty 25

## 2013-01-24 MED ORDER — WHITE PETROLATUM GEL
Status: AC
  Administered 2013-01-24: 0.2
  Filled 2013-01-24: qty 5

## 2013-01-24 MED ORDER — DOCUSATE SODIUM 100 MG PO CAPS
100.0000 mg | ORAL_CAPSULE | Freq: Two times a day (BID) | ORAL | Status: DC
  Administered 2013-01-24 – 2013-01-25 (×3): 100 mg via ORAL
  Filled 2013-01-24 (×7): qty 1

## 2013-01-24 MED ORDER — OXYCODONE-ACETAMINOPHEN 5-325 MG PO TABS: 1.0000 | ORAL_TABLET | ORAL | Status: DC | PRN

## 2013-01-24 NOTE — Progress Notes (Addendum)
2 Days Post-Op  Subjective: Sore. Less bloated today. Tolerated liquids yesterday. Hasn't been OOB since surgery. No flatus. Has eggs and bagel on tray this am. No emesis  Objective: Vital signs in last 24 hours: Temp:  [97.5 F (36.4 C)-98.2 F (36.8 C)] 98.2 F (36.8 C) (02/22 0805) Pulse Rate:  [69-98] 98 (02/22 0805) Resp:  [12-22] 19 (02/22 0805) BP: (115-137)/(56-68) 132/68 mmHg (02/22 0805) SpO2:  [95 %-100 %] 96 % (02/22 0805) Weight:  [231 lb 7.7 oz (105 kg)] 231 lb 7.7 oz (105 kg) (02/22 0008) Last BM Date:  (pta)  Intake/Output from previous day: 02/21 0701 - 02/22 0700 In: 1710 [P.O.:960; I.V.:750] Out: 1600 [Urine:1500; Drains:100] Intake/Output this shift: Total I/O In: -  Out: 50 [Drains:50]  Alert, nad cta w/decreased BS at bases Obese, soft, scattered BS. Incision c/d/i. Drain - old blood SCDs  Lab Results:   Recent Labs  01/22/13 1249 01/23/13 0436  WBC 13.1* 15.4*  HGB 13.5 12.6*  HCT 37.7* 36.0*  PLT 203 213   BMET  Recent Labs  01/22/13 1249 01/23/13 0436  NA  --  134*  K  --  4.9  CL  --  99  CO2  --  27  GLUCOSE  --  133*  BUN  --  19  CREATININE 0.93 1.13  CALCIUM  --  8.6   PT/INR  Recent Labs  01/22/13 0559 01/24/13 0555  LABPROT 14.3 14.6  INR 1.13 1.16   ABG No results found for this basename: PHART, PCO2, PO2, HCO3,  in the last 72 hours  Studies/Results: No results found.  Anti-infectives: Anti-infectives   Start     Dose/Rate Route Frequency Ordered Stop   01/22/13 0600  ceFAZolin (ANCEF) IVPB 2 g/50 mL premix     2 g 100 mL/hr over 30 Minutes Intravenous On call to O.R. 01/21/13 1335 01/22/13 0800      Assessment/Plan: s/p Procedure(s) with comments: HERNIA REPAIR VENTRAL ADULT (N/A) CYSTOSCOPY (N/A) - Cystoscopy with balloon dilation. Insertion of coude catheter. APPLICATION OF A-CELL OF CHEST/ABDOMEN (N/A) Continue foley due to intraop uretheral dilation for stricture. will keep foley one more  day OOB, ambulate IS Decreased IVF PO pain meds/decrease PCA tx to floor Cont coumadin  Mary Sella. Andrey Campanile, MD, FACS General, Bariatric, & Minimally Invasive Surgery [facility], [place]   LOS: 2 days    Atilano Ina 01/24/2013

## 2013-01-24 NOTE — Progress Notes (Signed)
Report given to Hepzibah, RN, [phone]; all questions answered; pt transported to [id number] with oxygen and tele monitor; no complications noted; belongings sent with family member

## 2013-01-24 NOTE — Progress Notes (Signed)
Patient states that feels nausea; breakfast in AM was cardiac diet; pt changed to clear liquid diet; will continue to monitor

## 2013-01-24 NOTE — Progress Notes (Deleted)
Utilization review complete 

## 2013-01-25 LAB — PROTIME-INR: Prothrombin Time: 15.2 seconds (ref 11.6–15.2)

## 2013-01-25 NOTE — Progress Notes (Signed)
3 Days Post-Op  Subjective: No flatus or bm, burping, some nausea, only to chair since yesterday, pain controlled  Objective: Vital signs in last 24 hours: Temp:  [97.9 F (36.6 C)-98.9 F (37.2 C)] 98.2 F (36.8 C) (02/23 0413) Pulse Rate:  [83-101] 97 (02/23 0413) Resp:  [16-23] 19 (02/23 0800) BP: (125-165)/(84-110) 156/84 mmHg (02/23 0600) SpO2:  [94 %-100 %] 94 % (02/23 0800) FiO2 (%):  [38 %] 38 % (02/23 0800) Weight:  [228 lb 9.9 oz (103.7 kg)] 228 lb 9.9 oz (103.7 kg) (02/22 1615) Last BM Date: 01/21/13  Intake/Output from previous day: 02/22 0701 - 02/23 0700 In: 2701.3 [P.O.:480; I.V.:2221.3] Out: 1355 [Urine:1275; Drains:80] Intake/Output this shift:    General appearance: fatigued Resp: diminished breath sounds bibasilar Cardio: regular rate and rhythm GI: wounds clean, jp with expected output, very few bs, approp tender  Lab Results:   Recent Labs  01/22/13 1249 01/23/13 0436  WBC 13.1* 15.4*  HGB 13.5 12.6*  HCT 37.7* 36.0*  PLT 203 213   BMET  Recent Labs  01/22/13 1249 01/23/13 0436  NA  --  134*  K  --  4.9  CL  --  99  CO2  --  27  GLUCOSE  --  133*  BUN  --  19  CREATININE 0.93 1.13  CALCIUM  --  8.6   PT/INR  Recent Labs  01/24/13 0555 01/25/13 0544  LABPROT 14.6 15.2  INR 1.16 1.22   Anti-infectives: Anti-infectives   Start     Dose/Rate Route Frequency Ordered Stop   01/22/13 0600  ceFAZolin (ANCEF) IVPB 2 g/50 mL premix     2 g 100 mL/hr over 30 Minutes Intravenous On call to O.R. 01/21/13 1335 01/22/13 0800      Assessment/Plan: S/p lap assist ventral hernia repair with biologic mesh, urethral dilation  1. Neuro- cont iv pain meds 2. Cv/pulm cont cardiac meds po he is tolerating, pulm toilet, needs to be walking and oob more, pt consult today 3. Gi he has ileus, will keep just at clears for now 4. Cont coumadin 5. I am going to leave foley one more day due to his activity and dilation, Dr. Vernie Ammons stated could be  removed when ready but I am concerned he will fail given inactivity right now 6. Check bmet in am  [facility] 01/25/2013

## 2013-01-26 ENCOUNTER — Inpatient Hospital Stay (HOSPITAL_COMMUNITY): Payer: BC Managed Care – PPO

## 2013-01-26 LAB — BASIC METABOLIC PANEL
Calcium: 9.4 mg/dL (ref 8.4–10.5)
Creatinine, Ser: 0.89 mg/dL (ref 0.50–1.35)
GFR calc Af Amer: >90 mL/min (ref >90)

## 2013-01-26 LAB — PROTIME-INR
INR: 1.35 (ref 0.00–1.49)
Prothrombin Time: 16.4 seconds — ABNORMAL HIGH (ref 11.6–15.2)

## 2013-01-26 MED ORDER — PROMETHAZINE HCL 25 MG/ML IJ SOLN
12.5000 mg | Freq: Four times a day (QID) | INTRAMUSCULAR | Status: DC | PRN
  Administered 2013-01-26 – 2013-01-27 (×2): 12.5 mg via INTRAVENOUS
  Filled 2013-01-26 (×2): qty 1

## 2013-01-26 MED ORDER — METOPROLOL TARTRATE 1 MG/ML IV SOLN: 5.0000 mg | INTRAVENOUS | Status: AC | PRN

## 2013-01-26 MED ORDER — METOPROLOL TARTRATE 1 MG/ML IV SOLN
5.0000 mg | Freq: Four times a day (QID) | INTRAVENOUS | Status: DC
  Administered 2013-01-26 – 2013-01-30 (×16): 5 mg via INTRAVENOUS
  Filled 2013-01-26 (×19): qty 5

## 2013-01-26 MED ORDER — METOCLOPRAMIDE HCL 5 MG/ML IJ SOLN
5.0000 mg | Freq: Three times a day (TID) | INTRAMUSCULAR | Status: AC
  Administered 2013-01-26 – 2013-01-28 (×8): 5 mg via INTRAVENOUS
  Filled 2013-01-26 (×11): qty 1

## 2013-01-26 NOTE — Evaluation (Signed)
Physical Therapy Evaluation Patient Details Name: Alejandro [name] MRN: [id number] DOB: [birth year] Today's Date: 01/26/2013 Time: 4098-1191 PT Time Calculation (min): 15 min  PT Assessment / Plan / Recommendation Clinical Impression  Pt s/p ventral hernia repair.  Pt would benefit from acute PT services in order to improve independence with transfers and ambulation to prepare for d/c home with spouse.  Pt limited on evaluation by fatigue as well as low sats and elevated HR during ambulation.    PT Assessment  Patient needs continued PT services    Follow Up Recommendations  Home health PT;Other (comment) (may progress to no f/u)    Does the patient have the potential to tolerate intense rehabilitation      Barriers to Discharge        Equipment Recommendations  Rolling walker with 5" wheels    Recommendations for Other Services     Frequency Min 3X/week    Precautions / Restrictions Precautions Precautions: Fall Precaution Comments: abdominal binder   Pertinent Vitals/Pain Abdominal pain with bed mobility, PCA encouraged      Mobility  Bed Mobility Bed Mobility: Supine to Sit;Sit to Supine Supine to Sit: 4: Min assist Sit to Supine: 4: Min assist Details for Bed Mobility Assistance: assist for trunk upright and LEs onto bed Transfers Transfers: Stand to Sit;Sit to Stand Sit to Stand: 4: Min guard;With upper extremity assist;From bed Stand to Sit: 4: Min guard;With upper extremity assist;To bed Details for Transfer Assistance: verbal cues for safety with lines Ambulation/Gait Ambulation/Gait Assistance: 4: Min guard Ambulation Distance (Feet): 30 Feet Assistive device: Other (Comment) Ambulation/Gait Assistance Details: pt pushed IV pole, pt reported SOB with ambulation, SaO2 dropped to 80% room air so pt stopped to perform pursed lip breathing and then ambulated back into room and applied 2L O2 [place] and increased to 96% quickly, HR also in 120's during ambulation Gait  Pattern: Step-through pattern;Decreased stride length;Wide base of support Gait velocity: decreased    Exercises     PT Diagnosis: Difficulty walking  PT Problem List: Decreased strength;Decreased activity tolerance;Decreased mobility;Cardiopulmonary status limiting activity;Pain PT Treatment Interventions: DME instruction;Gait training;Stair training;Functional mobility training;Therapeutic activities;Therapeutic exercise;Patient/family education   PT Goals Acute Rehab PT Goals PT Goal Formulation: With patient Time For Goal Achievement: 02/02/13 Potential to Achieve Goals: Good Pt will go Supine/Side to Sit: with modified independence PT Goal: Supine/Side to Sit - Progress: Goal set today Pt will go Sit to Supine/Side: with modified independence PT Goal: Sit to Supine/Side - Progress: Goal set today Pt will go Sit to Stand: with modified independence PT Goal: Sit to Stand - Progress: Goal set today Pt will go Stand to Sit: with modified independence PT Goal: Stand to Sit - Progress: Goal set today Pt will Ambulate: 51 - 150 feet;with modified independence;with least restrictive assistive device PT Goal: Ambulate - Progress: Goal set today Pt will Go Up / Down Stairs: 3-5 stairs;with rail(s);with modified independence PT Goal: Up/Down Stairs - Progress: Goal set today  Visit Information  Last PT Received On: 01/26/13 Assistance Needed: +1    Subjective Data  Subjective: just tired   Prior Functioning  Home Living Lives With: Spouse Type of Home: House Home Access: Stairs to enter Secretary/administrator of Steps: 3 Entrance Stairs-Rails: Right Home Layout: One level Home Adaptive Equipment: None Prior Function Level of Independence: Independent Comments: Spouse reports pt holds shopping cart during outings Communication Communication: No difficulties    Cognition  Cognition Overall Cognitive Status: Appears within functional limits  for tasks  assessed/performed Arousal/Alertness: Lethargic Orientation Level: Appears intact for tasks assessed Behavior During Session: Lethargic Cognition - Other Comments: pt reports being very tired    Extremity/Trunk Assessment Right Lower Extremity Assessment RLE ROM/Strength/Tone: [facility] for tasks assessed Left Lower Extremity Assessment LLE ROM/Strength/Tone: WFL for tasks assessed   Balance    End of Session PT - End of Session Equipment Utilized During Treatment: Gait belt Activity Tolerance: Patient limited by fatigue Patient left: in bed;with call bell/phone within reach;with family/visitor present Nurse Communication: Mobility status;Other (comment) (RN alerted through monitor tech of elevated HR)  GP     Andrewjames Weirauch,KATHrine E 01/26/2013, 3:21 PM Zenovia Jarred, PT, DPT 01/26/2013 Pager: [phone]

## 2013-01-26 NOTE — Progress Notes (Signed)
1130 Report called to Nurse Joyce,RN on 6 [place]. Patient wife Britta Mccreedy at bedside made aware of patient transfer all personal belongings given to wife. Patient skin dsg intact to JP drain  And staples intact to midline incision.

## 2013-01-26 NOTE — Progress Notes (Signed)
0850 Attempt to insert NGT met some resistance was unable to insert x 2 nurses.Dr. Carolynne Edouard made aware stated it was okay to leave tube out.

## 2013-01-26 NOTE — Progress Notes (Signed)
4 Days Post-Op  Subjective: Complains of nausea and vomiting  Objective: Vital signs in last 24 hours: Temp:  [97.8 F (36.6 C)-98.1 F (36.7 C)] 97.8 F (36.6 C) (02/24 0426) Pulse Rate:  [90-145] 107 (02/24 0426) Resp:  [16-22] 18 (02/24 0426) BP: (120-181)/(40-95) 181/95 mmHg (02/24 0426) SpO2:  [94 %-99 %] 94 % (02/24 0426) FiO2 (%):  [38 %-39 %] 39 % (02/23 1606) Last BM Date: 01/21/13  Intake/Output from previous day: 02/23 0701 - 02/24 0700 In: 1460 [P.O.:360; I.V.:1100] Out: 1720 [Urine:1400; Emesis/NG output:250; Drains:70] Intake/Output this shift:    GI: quiet, distended. incisions ok  Lab Results:  No results found for this basename: WBC, HGB, HCT, PLT,  in the last 72 hours BMET  Recent Labs  01/26/13 0700  NA 137  K 4.2  CL 100  CO2 27  GLUCOSE 140*  BUN 19  CREATININE 0.89  CALCIUM 9.4   PT/INR  Recent Labs  01/25/13 0544 01/26/13 0700  LABPROT 15.2 16.4*  INR 1.22 1.35   ABG No results found for this basename: PHART, PCO2, PO2, HCO3,  in the last 72 hours  Studies/Results: No results found.  Anti-infectives: Anti-infectives   Start     Dose/Rate Route Frequency Ordered Stop   01/22/13 0600  ceFAZolin (ANCEF) IVPB 2 g/50 mL premix     2 g 100 mL/hr over 30 Minutes Intravenous On call to O.R. 01/21/13 1335 01/22/13 0800      Assessment/Plan: s/p Procedure(s) with comments: HERNIA REPAIR VENTRAL ADULT (N/A) CYSTOSCOPY (N/A) - Cystoscopy with balloon dilation. Insertion of coude catheter. APPLICATION OF A-CELL OF CHEST/ABDOMEN (N/A) Make npo and place ng Start reglan for ileus Start IV metoprolol to replace oral cardiac meds Check abdominal xrays  LOS: 4 days    TOTH III,PAUL S 01/26/2013

## 2013-01-26 NOTE — Progress Notes (Signed)
Patient transferred to [facility] Room 01 from 5500  By way of radiology. S/P hernia repair. Abdomen distended and tight. MD aware. No NGT per previous note.  Oriented to room. Call bell in reach. Wife at bedside.

## 2013-01-27 ENCOUNTER — Ambulatory Visit: Payer: BC Managed Care – PPO | Admitting: Cardiovascular Disease

## 2013-01-27 ENCOUNTER — Inpatient Hospital Stay (HOSPITAL_COMMUNITY): Payer: BC Managed Care – PPO

## 2013-01-27 LAB — BASIC METABOLIC PANEL
CO2: 30 mEq/L (ref 19–32)
Chloride: 100 mEq/L (ref 96–112)
Glucose, Bld: 136 mg/dL — ABNORMAL HIGH (ref 70–99)
Potassium: 3.8 mEq/L (ref 3.5–5.1)
Sodium: 139 mEq/L (ref 135–145)

## 2013-01-27 LAB — CBC WITH DIFFERENTIAL/PLATELET
Basophils Absolute: 0 10*3/uL (ref 0.0–0.1)
HCT: 36.4 % — ABNORMAL LOW (ref 39.0–52.0)
Lymphocytes Relative: 7 % — ABNORMAL LOW (ref 12–46)
Lymphs Abs: 0.8 10*3/uL (ref 0.7–4.0)
MCV: 87.3 fL (ref 78.0–100.0)
Neutro Abs: 9.6 10*3/uL — ABNORMAL HIGH (ref 1.7–7.7)
Platelets: 251 10*3/uL (ref 150–400)
RBC: 4.17 MIL/uL — ABNORMAL LOW (ref 4.22–5.81)
RDW: 13.5 % (ref 11.5–15.5)
WBC: 11.3 10*3/uL — ABNORMAL HIGH (ref 4.0–10.5)

## 2013-01-27 LAB — MAGNESIUM: Magnesium: 2.2 mg/dL (ref 1.5–2.5)

## 2013-01-27 LAB — TROPONIN I: Troponin I: <0.3 ng/mL (ref <0.30)

## 2013-01-27 MED ORDER — CHLORHEXIDINE GLUCONATE 0.12 % MT SOLN
15.0000 mL | Freq: Two times a day (BID) | OROMUCOSAL | Status: DC
  Administered 2013-01-27 – 2013-01-29 (×6): 15 mL via OROMUCOSAL
  Filled 2013-01-27 (×5): qty 15

## 2013-01-27 MED ORDER — SODIUM CHLORIDE 0.9 % IJ SOLN
10.0000 mL | INTRAMUSCULAR | Status: DC | PRN
  Administered 2013-01-30: 10 mL

## 2013-01-27 MED ORDER — FUROSEMIDE 10 MG/ML IJ SOLN
20.0000 mg | Freq: Every day | INTRAMUSCULAR | Status: DC
  Administered 2013-01-27 – 2013-01-31 (×5): 20 mg via INTRAVENOUS
  Filled 2013-01-27 (×6): qty 2

## 2013-01-27 MED ORDER — BIOTENE DRY MOUTH MT LIQD
15.0000 mL | Freq: Two times a day (BID) | OROMUCOSAL | Status: DC
  Administered 2013-01-27 – 2013-01-29 (×6): 15 mL via OROMUCOSAL

## 2013-01-27 NOTE — Progress Notes (Signed)
PT Cancellation Note  Patient Details Name: Alejandro [name] MRN: [id number] DOB: [birth year]   Cancelled Treatment:    Reason Eval/Treat Not Completed: Patient at procedure or test/unavailable; arrived to work with patient however patient is currently with transport getting ready to be taken off the floor for a procedure. Will follow up for therapy tomorrow.   Fabio Asa 01/27/2013, 2:21 PM Charlotte Crumb, PT DPT  [phone]

## 2013-01-27 NOTE — Progress Notes (Signed)
5 Days Post-Op  Subjective: Still nauseated and distended but he does feel that he has passed some flatus  Objective: Vital signs in last 24 hours: Temp:  [97.9 F (36.6 C)-98.9 F (37.2 C)] 97.9 F (36.6 C) (02/25 0623) Pulse Rate:  [50-109] 96 (02/25 0623) Resp:  [16-24] 16 (02/25 0623) BP: (145-174)/(68-98) 155/80 mmHg (02/25 0623) SpO2:  [97 %-98 %] 97 % (02/25 0623) Weight:  [213 lb 10 oz (96.9 kg)] 213 lb 10 oz (96.9 kg) 02-02-2023 1406) Last BM Date: 01/21/13  Intake/Output from previous day: February 02, 2023 0701 - 02/25 0700 In: 1000 [I.V.:1000] Out: 1330 [Urine:1275; Drains:55] Intake/Output this shift:    GI: distended and quiet. incisions ok  Lab Results:  No results found for this basename: WBC, HGB, HCT, PLT,  in the last 72 hours BMET  Recent Labs  2013-02-02 0700  NA 137  K 4.2  CL 100  CO2 27  GLUCOSE 140*  BUN 19  CREATININE 0.89  CALCIUM 9.4   PT/INR  Recent Labs  01/25/13 0544 02/02/2013 0700  LABPROT 15.2 16.4*  INR 1.22 1.35   ABG No results found for this basename: PHART, PCO2, PO2, HCO3,  in the last 72 hours  Studies/Results: Dg Abd 2 Views  Feb 02, 2013  *RADIOLOGY REPORT*  Clinical Data: Ileus, nausea and vomiting.  ABDOMEN - 2 VIEW  Comparison: 02/01/2009 as well as CT abdomen/pelvis 10/27/2012  Findings: Lung bases are unremarkable.  There is mild gaseous distension of the stomach.  Skin staples are present over the abdomen most prominent vertically over the mid to lower abdomen. There are multiple metallic coils likely related to hernia repair. There is no definite free air present.  There is a catheter with tip over the left lower quadrant likely a surgical drain which has a proximal end over the right hip/inguinal region.  There are multiple air-filled dilated small bowel loops.  Air and stool are present within the colon.  Remainder of the exam is unchanged.  IMPRESSION: Postoperative changes of the abdomen as described.  Multiple dilated air filled  small bowel loops with air and stool throughout the colon.  Findings are likely due to postoperative ileus, although cannot exclude early/partial obstruction.   Original Report Authenticated By: Elberta Fortis, M.D.     Anti-infectives: Anti-infectives   Start     Dose/Rate Route Frequency Ordered Stop   01/22/13 0600  ceFAZolin (ANCEF) IVPB 2 g/50 mL premix     2 g 100 mL/hr over 30 Minutes Intravenous On call to O.R. 01/21/13 1335 01/22/13 0800      Assessment/Plan: s/p Procedure(s) with comments: HERNIA REPAIR VENTRAL ADULT (N/A) CYSTOSCOPY (N/A) - Cystoscopy with balloon dilation. Insertion of coude catheter. APPLICATION OF A-CELL OF CHEST/ABDOMEN (N/A) Continue npo Will have PICC placed and see if IR can get ng in. Check lytes and wbc Add IV lasix for HTN in addition to IV metoprolol Continue reglan for ileus OOB  LOS: 5 days    TOTH III,PAUL S 01/27/2013

## 2013-01-27 NOTE — Progress Notes (Signed)
Peripherally Inserted Central Catheter/Midline Placement  The IV Nurse has discussed with the patient and/or persons authorized to consent for the patient, the purpose of this procedure and the potential benefits and risks involved with this procedure.  The benefits include less needle sticks, lab draws from the catheter and patient may be discharged home with the catheter.  Risks include, but not limited to, infection, bleeding, blood clot (thrombus formation), and puncture of an artery; nerve damage and irregular heat beat.  Alternatives to this procedure were also discussed.  PICC/Midline Placement Documentation        Timmothy Sours 01/27/2013, 10:44 AM

## 2013-01-27 NOTE — Progress Notes (Signed)
No vomiting noted all night just nauseated intermittently with good relief from Phenergan,still holding NGT insertion,pt to speak with MD in am.

## 2013-01-28 ENCOUNTER — Inpatient Hospital Stay (HOSPITAL_COMMUNITY): Payer: BC Managed Care – PPO

## 2013-01-28 LAB — GLUCOSE, CAPILLARY: Glucose-Capillary: 141 mg/dL — ABNORMAL HIGH (ref 70–99)

## 2013-01-28 MED ORDER — TRACE MINERALS CR-CU-F-FE-I-MN-MO-SE-ZN IV SOLN
INTRAVENOUS | Status: AC
  Administered 2013-01-28: 17:00:00 via INTRAVENOUS
  Filled 2013-01-28: qty 1000

## 2013-01-28 MED ORDER — INSULIN ASPART 100 UNIT/ML ~~LOC~~ SOLN
0.0000 [IU] | Freq: Four times a day (QID) | SUBCUTANEOUS | Status: DC
  Administered 2013-01-28 – 2013-01-29 (×4): 1 [IU] via SUBCUTANEOUS
  Administered 2013-01-29 – 2013-01-30 (×2): 2 [IU] via SUBCUTANEOUS
  Administered 2013-01-30: 1 [IU] via SUBCUTANEOUS
  Administered 2013-01-30: 2 [IU] via SUBCUTANEOUS
  Administered 2013-01-30: 1 [IU] via SUBCUTANEOUS
  Administered 2013-01-31 (×2): 2 [IU] via SUBCUTANEOUS

## 2013-01-28 MED ORDER — POTASSIUM CHLORIDE 10 MEQ/100ML IV SOLN
INTRAVENOUS | Status: AC
  Administered 2013-01-28: 10 meq
  Filled 2013-01-28: qty 200

## 2013-01-28 MED ORDER — KCL IN DEXTROSE-NACL 20-5-0.9 MEQ/L-%-% IV SOLN
INTRAVENOUS | Status: DC
  Administered 2013-01-28: 16:00:00 via INTRAVENOUS
  Administered 2013-01-29: 20 mL/h via INTRAVENOUS
  Administered 2013-01-30: 20 mL via INTRAVENOUS
  Administered 2013-02-01: 20 mL/h via INTRAVENOUS
  Filled 2013-01-28 (×3): qty 1000

## 2013-01-28 MED ORDER — POTASSIUM CHLORIDE 10 MEQ/50ML IV SOLN
10.0000 meq | INTRAVENOUS | Status: AC
  Administered 2013-01-28 (×2): 10 meq via INTRAVENOUS
  Filled 2013-01-28 (×2): qty 50

## 2013-01-28 MED ORDER — FAT EMULSION 20 % IV EMUL
250.0000 mL | INTRAVENOUS | Status: AC
  Administered 2013-01-28: 250 mL via INTRAVENOUS
  Filled 2013-01-28: qty 250

## 2013-01-28 NOTE — Progress Notes (Signed)
6 Days Post-Op  Subjective: Feeling better. No nausea. Less distended. He still reports some flatus  Objective: Vital signs in last 24 hours: Temp:  [97.5 F (36.4 C)-99 F (37.2 C)] 97.5 F (36.4 C) (02/26 0535) Pulse Rate:  [79-103] 89 (02/26 0535) Resp:  [15-24] 18 (02/26 0959) BP: (150-185)/(75-99) 160/75 mmHg (02/26 0535) SpO2:  [86 %-99 %] 98 % (02/26 0959) Last BM Date: 01/21/13  Intake/Output from previous day: 02/25 0701 - 02/26 0700 In: 2582 [P.O.:60; I.V.:1519; NG/GT:1000] Out: 4082 [Urine:1725; Emesis/NG output:2350; Drains:7] Intake/Output this shift: Total I/O In: 30 [P.O.:30] Out: -   GI: softer, less distended. quiet. incisions look good  Lab Results:   Recent Labs  01/27/13 0834  WBC 11.3*  HGB 12.7*  HCT 36.4*  PLT 251   BMET  Recent Labs  01/26/13 0700 01/27/13 0834  NA 137 139  K 4.2 3.8  CL 100 100  CO2 27 30  GLUCOSE 140* 136*  BUN 19 23  CREATININE 0.89 1.00  CALCIUM 9.4 9.6   PT/INR  Recent Labs  01/26/13 0700  LABPROT 16.4*  INR 1.35   ABG No results found for this basename: PHART, PCO2, PO2, HCO3,  in the last 72 hours  Studies/Results: Dg Abd 1 View  01/27/2013  *RADIOLOGY REPORT*  Clinical Data: Nasogastric tube placement under fluoroscopy.  ABDOMEN - 1 VIEW  Comparison: None.  Findings: The procedure was performed by a trained radiologic technologist.  1.37 minutes of fluoroscopy was utilized.  The tube extends into the stomach with the tip located in the distal stomach.  IMPRESSION: Nasogastric tube placement with tip in distal stomach.   Original Report Authenticated By: Irish Lack, M.D.    Dg Chest Port 1 View  01/27/2013  *RADIOLOGY REPORT*  Clinical Data: PICC placement.  PORTABLE CHEST - 1 VIEW  Comparison: 01/16/2013.  Findings: Low volume chest. Low volumes accentuate the size of the cardiopericardial silhouette.  Median sternotomy / CABG.  Basilar atelectasis.  The right upper extremity PICC is present with  the tip in the right atrium.  The tip of the PICC is 7 cm below the carina.  This should be withdrawn about 2 cm for positioning at the cavoatrial junction.  IMPRESSION:  1.  Interval placement of right upper extremity PICC with the tip in the right atrium.  Recommend withdrawal 2 cm. 2.  Low volume chest.   Original Report Authenticated By: Andreas Newport, M.D.    Dg Abd 2 Views  01/26/2013  *RADIOLOGY REPORT*  Clinical Data: Ileus, nausea and vomiting.  ABDOMEN - 2 VIEW  Comparison: 02/01/2009 as well as CT abdomen/pelvis 10/27/2012  Findings: Lung bases are unremarkable.  There is mild gaseous distension of the stomach.  Skin staples are present over the abdomen most prominent vertically over the mid to lower abdomen. There are multiple metallic coils likely related to hernia repair. There is no definite free air present.  There is a catheter with tip over the left lower quadrant likely a surgical drain which has a proximal end over the right hip/inguinal region.  There are multiple air-filled dilated small bowel loops.  Air and stool are present within the colon.  Remainder of the exam is unchanged.  IMPRESSION: Postoperative changes of the abdomen as described.  Multiple dilated air filled small bowel loops with air and stool throughout the colon.  Findings are likely due to postoperative ileus, although cannot exclude early/partial obstruction.   Original Report Authenticated By: Elberta Fortis, M.D.  Dg Naso G Tube Plc W/fl-no Rad  01/27/2013  CLINICAL DATA: attempts failed on unit   NASO G TUBE PLACEMENT WITH FLUORO  Fluoroscopy was utilized by the requesting physician.  No radiographic  interpretation.      Anti-infectives: Anti-infectives   Start     Dose/Rate Route Frequency Ordered Stop   01/22/13 0600  ceFAZolin (ANCEF) IVPB 2 g/50 mL premix     2 g 100 mL/hr over 30 Minutes Intravenous On call to O.R. 01/21/13 1335 01/22/13 0800      Assessment/Plan: s/p Procedure(s) with  comments: HERNIA REPAIR VENTRAL ADULT (N/A) CYSTOSCOPY (N/A) - Cystoscopy with balloon dilation. Insertion of coude catheter. APPLICATION OF A-CELL OF CHEST/ABDOMEN (N/A) Continue ng and bowel rest Start tpn for nutrition support Will get CT to evaluate hernia repair  LOS: 6 days    TOTH III,Shirla Hodgkiss S 01/28/2013

## 2013-01-28 NOTE — Progress Notes (Signed)
Physical Therapy Treatment Patient Details Name: Alejandro [name] MRN: [id number] DOB: 1946/02/19 Today's Date: 01/28/2013 Time: 0117-0140 PT Time Calculation (min): 23 min  PT Assessment / Plan / Recommendation Comments on Treatment Session  Pt making steady progress with mobility.   Pleasant & willing to participate.      Follow Up Recommendations  Home health PT;Other (comment) (may progress to no f/u)     Does the patient have the potential to tolerate intense rehabilitation     Barriers to Discharge        Equipment Recommendations  Rolling walker with 5" wheels    Recommendations for Other Services    Frequency Min 3X/week   Plan Discharge plan remains appropriate    Precautions / Restrictions Precautions Precautions: Fall Restrictions Weight Bearing Restrictions: No       Mobility  Bed Mobility Bed Mobility: Sit to Supine Sit to Supine: 4: Min assist Details for Bed Mobility Assistance: (A) to manage lines only.   Transfers Transfers: Sit to Stand;Stand to Sit Sit to Stand: 4: Min guard;With upper extremity assist;With armrests;From chair/3-in-1 Stand to Sit: 5: Supervision;With upper extremity assist;To bed;To chair/3-in-1 Details for Transfer Assistance: Cues for safest hand placement & safety with lines.  Performed 5x's.   Ambulation/Gait Ambulation/Gait Assistance: 4: Min guard Ambulation Distance (Feet): 120 Feet Assistive device: Other (Comment) (pushing IV pole) Ambulation/Gait Assistance Details: Pt with small shuffle-like steps.  Cues for pursed lip breathing.  02 sats dropping to 85% RA at one point but quickly returned back to 90's with standing rest break.   Gait Pattern: Step-through pattern;Decreased stride length;Shuffle (decreased step height) Gait velocity: decreased Stairs: No Wheelchair Mobility Wheelchair Mobility: No      PT Goals Acute Rehab PT Goals Time For Goal Achievement: 02/02/13 Potential to Achieve Goals: Good Pt will go  Supine/Side to Sit: with modified independence Pt will go Sit to Supine/Side: with modified independence PT Goal: Sit to Supine/Side - Progress: Progressing toward goal Pt will go Sit to Stand: with modified independence PT Goal: Sit to Stand - Progress: Progressing toward goal Pt will go Stand to Sit: with modified independence PT Goal: Stand to Sit - Progress: Progressing toward goal Pt will Ambulate: 51 - 150 feet;with modified independence;with least restrictive assistive device PT Goal: Ambulate - Progress: Progressing toward goal Pt will Go Up / Down Stairs: 3-5 stairs;with rail(s);with modified independence  Visit Information  Last PT Received On: 01/28/13 Assistance Needed: +1    Subjective Data      Cognition  Cognition Overall Cognitive Status: Appears within functional limits for tasks assessed/performed Arousal/Alertness: Awake/alert Orientation Level: Appears intact for tasks assessed Behavior During Session: [facility] for tasks performed    Balance     End of Session PT - End of Session Equipment Utilized During Treatment: Gait belt Activity Tolerance: Patient tolerated treatment well Patient left: in bed;with call bell/phone within reach;with family/visitor present Nurse Communication: Mobility status     Verdell Face, [place] [phone] 01/28/2013

## 2013-01-28 NOTE — Progress Notes (Signed)
INITIAL NUTRITION ASSESSMENT  DOCUMENTATION CODES Per approved criteria  -Obesity Unspecified   INTERVENTION:  TPN, per pharmacy  NUTRITION DIAGNOSIS: Inadequate oral intake related to inability to eat as evidenced by NPO status.   Monitor:  1. TPN initiation and rate advancement 2. Diet advancement 3. I/O's 4. Weight trends/changes 5. Labs  Reason for Assessment: Consult: TPN  67 y.o. male  Admitting Dx: Ventral hernia  ASSESSMENT: Pt with a large ventral hernia to the right of the umbilicus. Pt admitted for surgery with dx of ventral hernia. Pt s/p cystoscopy with balloon dilation and foley catheter placement 2/20.   Pt previously on a Heart Healthy diet with no meal completion documented from that time, changed to CL diet due to nausea, per RN note. Pt with ileus and therefore kept on clears before transitioning to NPO. TPN ordered and to start today, per pharmacy.   Spoke with pt who states that PTA he had a very good appetite. He typically ate 3 meals a day and would sometimes snack between meals. Pt stated that he did not experience any weight loss in the past few months and that, if anything, he has gained a few pounds recently.  Per pharmacy, pt to start on Clinimix E 5/20 at 40 mL/hr and 20% lipids (MWF due to national backorder) at 10 mL/hr. This TPN regimen will provide 1051 kcal and 48 grams of protein, based on weekly average. This will provide 55% minimum estimated kcal needs and 51% minimum estimated protein needs.  Plans to increase infusion rate as tolerated.  Height: Ht Readings from Last 1 Encounters:  01/26/13 5\' 8"  (1.727 m)    Weight: Wt Readings from Last 1 Encounters:  01/26/13 213 lb 10 oz (96.9 kg)    Ideal Body Weight: 154 lbs  % Ideal Body Weight: 138%  Wt Readings from Last 10 Encounters:  01/26/13 213 lb 10 oz (96.9 kg)  01/26/13 213 lb 10 oz (96.9 kg)  01/16/13 229 lb 6.4 oz (104.055 kg)  11/10/12 221 lb 9.6 oz (100.517 kg)   10/22/12 218 lb (98.884 kg)  09/26/12 215 lb (97.523 kg)  07/25/12 215 lb 6.4 oz (97.705 kg)  12/20/11 219 lb 6.4 oz (99.519 kg)  05/21/11 215 lb (97.523 kg)  11/22/10 199 lb (90.266 kg)    Usual Body Weight: 215 lbs  % Usual Body Weight: 99%  BMI:  Body mass index is 32.49 kg/(m^2).--Obesity Class I  Estimated Nutritional Needs: Kcal: 1900-2100 Protein: 95-105 grams Fluid: 1.9-2.1 L/day  Skin: abdominal incision with closed system drain  Diet Order: NPO  EDUCATION NEEDS: -No education needs identified at this time   Intake/Output Summary (Last 24 hours) at 01/28/13 1210 Last data filed at 01/28/13 1610  Gross per 24 hour  Intake   2612 ml  Output   4082 ml  Net  -1470 ml    Last BM: PTA  Labs:   Recent Labs Lab 01/23/13 0436 01/26/13 0700 01/27/13 0834  NA 134* 137 139  K 4.9 4.2 3.8  CL 99 100 100  CO2 27 27 30   BUN 19 19 23   CREATININE 1.13 0.89 1.00  CALCIUM 8.6 9.4 9.6  MG  --   --  2.2  GLUCOSE 133* 140* 136*    CBG (last 3)  No results found for this basename: GLUCAP,  in the last 72 hours  Scheduled Meds: . antiseptic oral rinse  15 mL Mouth Rinse q12n4p  . chlorhexidine  15 mL Mouth Rinse BID  .  enoxaparin (LOVENOX) injection  30 mg Subcutaneous Q24H  . furosemide  20 mg Intravenous Daily  . insulin aspart  0-9 Units Subcutaneous Q6H  . metoCLOPramide (REGLAN) injection  5 mg Intravenous Q8H  . metoprolol  5 mg Intravenous Q6H  . morphine   Intravenous Q4H  . potassium chloride  10 mEq Intravenous Q1 Hr x 2    Continuous Infusions: . dextrose 5 % and 0.9 % NaCl with KCl 20 mEq/L 75 mL/hr at 01/28/13 0016  . dextrose 5 % and 0.9 % NaCl with KCl 20 mEq/L    . TPN (CLINIMIX) +/- additives     And  . fat emulsion      Past Medical History  Diagnosis Date  . CHF (congestive heart failure)     ischemic  . 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 Dyslipidemia   . HTN (hypertension)   . Atrial fibrillation    . Cancer     around nose    Past Surgical History  Procedure Laterality Date  . Coronary artery bypass graft  12/24/2008    4 vessel  . Umbilical hernia repair  2010  . Hernia repair  2012  . Coronary artery bypass graft  2012  . Ventral hernia repair N/A 01/22/2013    Procedure: HERNIA REPAIR VENTRAL ADULT;  Surgeon: Robyne Askew, MD;  Location: [facility] OR;  Service: General;  Laterality: N/A;  . Application of a-cell of chest/abdomen N/A 01/22/2013    Procedure: APPLICATION OF A-CELL OF CHEST/ABDOMEN;  Surgeon: Robyne Askew, MD;  Location: [facility] OR;  Service: General;  Laterality: N/A;  . Cystoscopy N/A 01/22/2013    Procedure: Bluford Kaufmann;  Surgeon: Garnett Farm, MD;  Location: [facility] OR;  Service: Urology;  Laterality: N/A;  Cystoscopy with balloon dilation. Insertion of coude catheter.    Trenton Gammon Dietetic Intern # [phone]  Loyce Dys, MS RD LDN Clinical Inpatient Dietitian Pager: [phone] Weekend/After hours pager: [phone]

## 2013-01-28 NOTE — Progress Notes (Signed)
PARENTERAL NUTRITION CONSULT NOTE - INITIAL  Pharmacy Consult:  TPN Indication:  Prolonged ileus  No Known Allergies  Patient Measurements: Height: 5\' 8"  (172.7 cm) Weight: 213 lb 10 oz (96.9 kg) IBW/kg (Calculated) : 68.4  Vital Signs: Temp: 97.5 F (36.4 C) (02/26 0535) Temp src: Oral (02/26 0535) BP: 160/75 mmHg (02/26 0535) Pulse Rate: 89 (02/26 0535) Intake/Output from previous day: 02/25 0701 - 02/26 0700 In: 2582 [P.O.:60; I.V.:1519; NG/GT:1000] Out: 4082 [Urine:1725; Emesis/NG output:2350; Drains:7] Intake/Output from this shift: Total I/O In: 30 [P.O.:30] Out: -   Labs:  Recent Labs  01/26/13 0700 01/27/13 0834  WBC  --  11.3*  HGB  --  12.7*  HCT  --  36.4*  PLT  --  251  INR 1.35  --      Recent Labs  01/26/13 0700 01/27/13 0834  NA 137 139  K 4.2 3.8  CL 100 100  CO2 27 30  GLUCOSE 140* 136*  BUN 19 23  CREATININE 0.89 1.00  CALCIUM 9.4 9.6  MG  --  2.2   Estimated Creatinine Clearance: 80.9 ml/min (by C-G formula based on Cr of 1).   No results found for this basename: GLUCAP,  in the last 72 hours  Medical History: Past Medical History  Diagnosis Date  . CHF (congestive heart failure)     ischemic  . 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 Dyslipidemia   . HTN (hypertension)   . Atrial fibrillation   . Cancer     around nose       Insulin Requirements in the past 24 hours:  None - not on SSI  Current Nutrition:  NPO  Assessment: 1 YOM presented 01/22/13 with recurrent ventral hernia, s/p repair, POD#6.  Reglan was added for ileus and Phenergan has helped his nausea.  Patient to remain on bowel rest and Pharmacy consulted to manage TPN for prolonged ileus.   GI: TPN for ileus s/p ventral hernia repair, significant NG O/P - CT today.  Continues on IV Reglan   Endo: no hx DM - serum glucose < 150 Lytes: all WNL except K+ at 3.8 (goal >/= 4 for ileus) Renal: SCr 1, good UOP, D5NS with KCL at  75 ml/hr Pulm: transitioned to RA 2/26 Cards: hx CHF / CAD / HLD / AFib / HTN - BP elevated, HR normal - on IV Lasix, IV Lopressor Anticoag: Coumadin PTA for hx AFib, currently on hold d/t ileus - INR 1.35, hgb stable, plts WNL Hepatobil: no labs since admission Neuro: morphine PCA for pain control ID: afebrile, WBC mildly elevated    Best Practices: Lovenox, MC TPN Access: PICC 01/27/13 TPN day#: 1 (2/26 >> )   Nutritional Goals:  1900 - 2000 kCal, 95 - 110 grams of protein per day   Plan:  - Clinimix E 5/20 at 40 ml/hr - Lipids / multivitamins / trace elements MWF d/t national shortage - Start SSI Q6H - Decrease IVF to 35 ml/hr once TPN starts at 1800 - KCL x 2 runs - Consider adding IV hydralazine for better BP control and increase Lovenox to 50mg  SQ Q24H for VTE px in patient with BMI > 30 - F/U AM labs     Alejandro [name] D. Laney Potash, PharmD, BCPS Pager:  [phone] 01/28/2013, 10:43 AM

## 2013-01-29 LAB — MAGNESIUM: Magnesium: 2.5 mg/dL (ref 1.5–2.5)

## 2013-01-29 LAB — DIFFERENTIAL
Basophils Absolute: 0 10*3/uL (ref 0.0–0.1)
Eosinophils Absolute: 0.3 10*3/uL (ref 0.0–0.7)
Eosinophils Relative: 2 % (ref 0–5)
Lymphocytes Relative: 14 % (ref 12–46)
Neutrophils Relative %: 75 % (ref 43–77)

## 2013-01-29 LAB — GLUCOSE, CAPILLARY
Glucose-Capillary: 139 mg/dL — ABNORMAL HIGH (ref 70–99)
Glucose-Capillary: 149 mg/dL — ABNORMAL HIGH (ref 70–99)
Glucose-Capillary: 152 mg/dL — ABNORMAL HIGH (ref 70–99)

## 2013-01-29 LAB — COMPREHENSIVE METABOLIC PANEL
Alkaline Phosphatase: 59 U/L (ref 39–117)
BUN: 22 mg/dL (ref 6–23)
Calcium: 9.4 mg/dL (ref 8.4–10.5)
Creatinine, Ser: 0.99 mg/dL (ref 0.50–1.35)
GFR calc Af Amer: >90 mL/min (ref >90)
Glucose, Bld: 131 mg/dL — ABNORMAL HIGH (ref 70–99)
Total Protein: 6.9 g/dL (ref 6.0–8.3)

## 2013-01-29 LAB — CBC
MCV: 89.4 fL (ref 78.0–100.0)
Platelets: 238 10*3/uL (ref 150–400)
RDW: 13.5 % (ref 11.5–15.5)
WBC: 11.2 10*3/uL — ABNORMAL HIGH (ref 4.0–10.5)

## 2013-01-29 LAB — PREALBUMIN: Prealbumin: 13.9 mg/dL — ABNORMAL LOW (ref 17.0–34.0)

## 2013-01-29 MED ORDER — POTASSIUM CHLORIDE 10 MEQ/100ML IV SOLN
10.0000 meq | INTRAVENOUS | Status: AC
  Administered 2013-01-29 (×2): 10 meq via INTRAVENOUS
  Filled 2013-01-29: qty 100

## 2013-01-29 MED ORDER — CLINIMIX E/DEXTROSE (5/20) 5 % IV SOLN
INTRAVENOUS | Status: AC
  Administered 2013-01-29: 17:00:00 via INTRAVENOUS
  Filled 2013-01-29: qty 2000

## 2013-01-29 MED ORDER — POTASSIUM CHLORIDE 10 MEQ/50ML IV SOLN
10.0000 meq | INTRAVENOUS | Status: AC
  Administered 2013-01-29 (×3): 10 meq via INTRAVENOUS
  Filled 2013-01-29 (×4): qty 50

## 2013-01-29 NOTE — Progress Notes (Signed)
PARENTERAL NUTRITION CONSULT NOTE - Follow up  Pharmacy Consult:  TPN Indication:  Prolonged ileus  No Known Allergies  Patient Measurements: Height: 5\' 8"  (172.7 cm) Weight: 213 lb 10 oz (96.9 kg) IBW/kg (Calculated) : 68.4  Vital Signs: Temp: 98.1 F (36.7 C) (02/27 0939) Temp src: Oral (02/27 0939) BP: 162/92 mmHg (02/27 0939) Pulse Rate: 95 (02/27 0939) Intake/Output from previous day: 02/26 0701 - 02/27 0700 In: 1090 [P.O.:90; I.V.:366; TPN:634] Out: 4350 [Urine:2250; Emesis/NG output:2100] Intake/Output from this shift:    Labs:  Recent Labs  01/27/13 0834 01/29/13 0503  WBC 11.3* 11.2*  HGB 12.7* 13.3  HCT 36.4* 38.6*  PLT 251 238     Recent Labs  01/27/13 0834 01/29/13 0503  NA 139 146*  K 3.8 3.2*  CL 100 104  CO2 30 34*  GLUCOSE 136* 131*  BUN 23 22  CREATININE 1.00 0.99  CALCIUM 9.6 9.4  MG 2.2 2.5  PHOS  --  2.5  PROT  --  6.9  ALBUMIN  --  3.1*  AST  --  26  ALT  --  20  ALKPHOS  --  59  BILITOT  --  0.8  TRIG  --  116  CHOL  --  86   Estimated Creatinine Clearance: 81.7 ml/min (by C-G formula based on Cr of 0.99).    Recent Labs  01/28/13 1844 01/29/13 0009 01/29/13 0554  GLUCAP 141* 152* 139*    Medical History: Past Medical History  Diagnosis Date  . CHF (congestive heart failure)     ischemic  . 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 Dyslipidemia   . HTN (hypertension)   . Atrial fibrillation   . Cancer     around nose       Insulin Requirements in the past 24 hours:  4 units Novolog sensitive SSI q6h  Current Nutrition:  NPO Clinimix E 5/20 at 68ml/hr with Lipids 20% at 30ml/hr MWF  Assessment: 66 YOM presented 01/22/13 with recurrent ventral hernia, s/p repair, POD#7.  Reglan was added for ileus and Phenergan has helped his nausea.  Patient to remain on bowel rest and Pharmacy consulted to manage TPN for prolonged ileus.   GI: TPN for ileus s/p ventral hernia repair, significant  NG O/P - CT today.  Continues on IV Reglan, some flatus, to continue tpn; tolerating well Endo: no hx DM - CBG<150,  Lytes: all WNL except K+ at 3.2 (goal >/= 4 for ileus), will replaced Renal: SCr 1, good UOP, D5NS with KCL at 35 ml/hr Pulm: transitioned to RA 2/26 Cards: hx CHF / CAD / HLD / AFib / HTN - BP elevated, HR normal - on IV Lasix, IV Lopressor Anticoag: Coumadin PTA for hx AFib, currently on hold d/t ileus - INR 1.35, hgb stable, plts WNL; lovenox 30mg  daily Hepatobil: LFT wnl Neuro: morphine PCA for pain control ID: afebrile, WBC mildly elevated    Best Practices: Lovenox, MC TPN Access: PICC 01/27/13 TPN day#: 2 (2/26 >> )   Nutritional Goals:  1900 - 2000 kCal, 95 - 110 grams of protein per day Clinimix goal rate is E 5/20 at 83 ml/hr. Would provide 1966kcal and 100g protein on daily average to meet caloric and protein requirements.  Plan:  - Increase Clinimix E 5/20 to 83 ml/hr - Lipids / multivitamins / trace elements MWF d/t national shortage - Cont SSI Q6H - Decrease IVF to kvo - KCL x  2 runs, will give additional 4 runs - Consider adding IV hydralazine for better BP control and increase Lovenox to 50mg  SQ Q24H for VTE px in patient with BMI > 30 - F/U AM labs     Thuy D. Laney Potash, PharmD, BCPS Pager:  [phone] 01/29/2013, 10:46 AM

## 2013-01-29 NOTE — Progress Notes (Signed)
7 Days Post-Op  Subjective: Pt with a little flatus.  No significant pain.  CT showed no complicating features.  Looks like ileus. ON TNA  Objective: Vital signs in last 24 hours: Temp:  [97.8 F (36.6 C)-98.2 F (36.8 C)] 97.9 F (36.6 C) (02/27 0530) Pulse Rate:  [84-104] 101 (02/27 0530) Resp:  [14-20] 16 (02/27 0530) BP: (139-177)/(70-93) 154/79 mmHg (02/27 0530) SpO2:  [92 %-98 %] 96 % (02/27 0530) Last BM Date: 01/21/13  Intake/Output from previous day: 02/26 0701 - 02/27 0700 In: 1090 [P.O.:90; I.V.:366; TPN:634] Out: 4350 [Urine:2250; Emesis/NG output:2100] Intake/Output this shift:    Incision/Wound:CLEAN DRY INTACT.  SOFT MILD DISTENSION  Lab Results:   Recent Labs  01/27/13 0834 01/29/13 0503  WBC 11.3* 11.2*  HGB 12.7* 13.3  HCT 36.4* 38.6*  PLT 251 238   BMET  Recent Labs  01/27/13 0834 01/29/13 0503  NA 139 146*  K 3.8 3.2*  CL 100 104  CO2 30 34*  GLUCOSE 136* 131*  BUN 23 22  CREATININE 1.00 0.99  CALCIUM 9.6 9.4   PT/INR No results found for this basename: LABPROT, INR,  in the last 72 hours ABG No results found for this basename: PHART, PCO2, PO2, HCO3,  in the last 72 hours  Studies/Results: Ct Abdomen Pelvis Wo Contrast  01/28/2013  *RADIOLOGY REPORT*  Clinical Data: Abdominal distension and pain following hernia repair.  CT ABDOMEN AND PELVIS WITHOUT CONTRAST  Technique:  Multidetector CT imaging of the abdomen and pelvis was performed following the standard protocol without intravenous contrast.  Comparison: CT of the abdomen and pelvis 10/27/2012.  Findings:  Lung Bases: Trace left and small right-sided pleural effusions. Volume loss and architectural distortion in the right lower lobe favored to represent a combination of atelectasis and consolidation.  The right lower lobe bronchi appear to be filled with aspirated material or endobronchial debris. Heart size is mildly enlarged. Atherosclerotic calcifications in the left anterior  descending, left circumflex and right coronary arteries. Status post median sternotomy.  Abdomen/Pelvis:  A nasogastric tube is seen extending into the second portion of the duodenum.  There are postoperative changes of mesh repair for the previously noted ventral hernia.  No evidence of recurrent herniation.  There is a surgical drain in the soft tissues of the anterior abdominal wall immediately adjacent to the surgical site.  Overlying skin staples are noted.  No gas or well- defined fluid collection is identified within the soft tissues of the anterior abdominal wall this time. A tiny locule of gas is tiny locules of gas are noted in the soft tissues of the flank bilaterally, presumably iatrogenic.  There are some mildly dilated loops of fluid-filled small bowel measuring up to 4.2 cm in diameter.  However, there is gas and stool noted throughout the colon including the distal rectum.  The unenhanced appearance of the liver, gallbladder, pancreas, spleen and bilateral adrenal glands is unremarkable.  Numerous calcifications are noted in the renal hila bilaterally, favored to be vascular calcifications (these may alternatively represent nonobstructive calculi).  Bilateral perinephric stranding is nonspecific, but increased compared to the prior examination, and can be seen in the setting of renal insufficiency.  Extensive atherosclerosis throughout the abdominal and pelvic vasculature, without definite aneurysm.  Small amount of presacral edema.  No significant volume of ascites.  No pneumoperitoneum.   No definite pathologic lymphadenopathy identified within the abdomen or pelvis on this noncontrast CT examination. Urinary bladder is largely decompressed with Foley balloon catheter in  place, and a small amount of gas in the nondependent portion of the urinary bladder which is iatrogenic.  Musculoskeletal: Sternotomy wires. There are no aggressive appearing lytic or blastic lesions noted in the visualized  portions of the skeleton.  IMPRESSION: 1.  Postoperative changes related to recent mesh repair for ventral hernia without definite complicating features. 2.  Mild dilatation of some portions of the mid small bowel, as above, is nonspecific, but fail to represent a mild postoperative ileus. 3.  Trace left and small right-sided pleural effusions with a combination of atelectasis and consolidation in the right lower lobe, and findings suggestive of aspirated material or endobronchial debris in the right lower lobe bronchi. 4. Atherosclerosis, including three vessel coronary artery disease. Assessment for potential risk factor modification, dietary therapy or pharmacologic therapy may be warranted, if clinically indicated.   Original Report Authenticated By: Trudie Reed, M.D.    Dg Abd 1 View  01/27/2013  *RADIOLOGY REPORT*  Clinical Data: Nasogastric tube placement under fluoroscopy.  ABDOMEN - 1 VIEW  Comparison: None.  Findings: The procedure was performed by a trained radiologic technologist.  1.37 minutes of fluoroscopy was utilized.  The tube extends into the stomach with the tip located in the distal stomach.  IMPRESSION: Nasogastric tube placement with tip in distal stomach.   Original Report Authenticated By: Irish Lack, M.D.    Dg Chest Port 1 View  01/27/2013  *RADIOLOGY REPORT*  Clinical Data: PICC placement.  PORTABLE CHEST - 1 VIEW  Comparison: 01/16/2013.  Findings: Low volume chest. Low volumes accentuate the size of the cardiopericardial silhouette.  Median sternotomy / CABG.  Basilar atelectasis.  The right upper extremity PICC is present with the tip in the right atrium.  The tip of the PICC is 7 cm below the carina.  This should be withdrawn about 2 cm for positioning at the cavoatrial junction.  IMPRESSION:  1.  Interval placement of right upper extremity PICC with the tip in the right atrium.  Recommend withdrawal 2 cm. 2.  Low volume chest.   Original Report Authenticated By:  Andreas Newport, M.D.    Dg Naso G Tube Plc W/fl-no Rad  01/27/2013  CLINICAL DATA: attempts failed on unit   NASO G TUBE PLACEMENT WITH FLUORO  Fluoroscopy was utilized by the requesting physician.  No radiographic  interpretation.      Anti-infectives: Anti-infectives   Start     Dose/Rate Route Frequency Ordered Stop   01/22/13 0600  ceFAZolin (ANCEF) IVPB 2 g/50 mL premix     2 g 100 mL/hr over 30 Minutes Intravenous On call to O.R. 01/21/13 1335 01/22/13 0800      Assessment/Plan: s/p Procedure(s) with comments: HERNIA REPAIR VENTRAL ADULT (N/A) CYSTOSCOPY (N/A) - Cystoscopy with balloon dilation. Insertion of coude catheter. APPLICATION OF A-CELL OF CHEST/ABDOMEN (N/A) hypokalemia cont TNA.  ambulate more. replace potasium.  LOS: 7 days    Fermin Yan A. 01/29/2013

## 2013-01-30 LAB — BASIC METABOLIC PANEL
BUN: 24 mg/dL — ABNORMAL HIGH (ref 6–23)
CO2: 34 mEq/L — ABNORMAL HIGH (ref 19–32)
Calcium: 9.3 mg/dL (ref 8.4–10.5)
Chloride: 108 mEq/L (ref 96–112)
Creatinine, Ser: 0.96 mg/dL (ref 0.50–1.35)
Glucose, Bld: 149 mg/dL — ABNORMAL HIGH (ref 70–99)

## 2013-01-30 LAB — GLUCOSE, CAPILLARY: Glucose-Capillary: 147 mg/dL — ABNORMAL HIGH (ref 70–99)

## 2013-01-30 MED ORDER — CLINIMIX E/DEXTROSE (5/20) 5 % IV SOLN
INTRAVENOUS | Status: DC
  Administered 2013-01-30: 18:00:00 via INTRAVENOUS
  Filled 2013-01-30: qty 2000

## 2013-01-30 MED ORDER — CARVEDILOL 25 MG PO TABS
25.0000 mg | ORAL_TABLET | Freq: Two times a day (BID) | ORAL | Status: DC
  Administered 2013-01-30 – 2013-02-01 (×5): 25 mg via ORAL
  Filled 2013-01-30 (×8): qty 1

## 2013-01-30 MED ORDER — ADULT MULTIVITAMIN W/MINERALS CH
1.0000 | ORAL_TABLET | Freq: Every day | ORAL | Status: DC
  Administered 2013-01-30 – 2013-02-01 (×3): 1 via ORAL
  Filled 2013-01-30 (×4): qty 1

## 2013-01-30 MED ORDER — CARVEDILOL 25 MG PO TABS
25.0000 mg | ORAL_TABLET | Freq: Two times a day (BID) | ORAL | Status: DC
  Filled 2013-01-30 (×2): qty 1

## 2013-01-30 MED ORDER — OXYCODONE-ACETAMINOPHEN 5-325 MG PO TABS: 2.0000 | ORAL_TABLET | ORAL | Status: DC | PRN

## 2013-01-30 MED ORDER — FAT EMULSION 20 % IV EMUL
250.0000 mL | INTRAVENOUS | Status: DC
  Administered 2013-01-30: 250 mL via INTRAVENOUS
  Filled 2013-01-30: qty 250

## 2013-01-30 NOTE — Progress Notes (Signed)
PARENTERAL NUTRITION CONSULT NOTE - Follow up  Pharmacy Consult:  TPN Indication:  Prolonged ileus  No Known Allergies  Patient Measurements: Height: 5\' 8"  (172.7 cm) Weight: 213 lb 10 oz (96.9 kg) IBW/kg (Calculated) : 68.4  Vital Signs: Temp: 97.5 F (36.4 C) (02/28 0546) Temp src: Oral (02/28 0546) BP: 164/84 mmHg (02/28 0546) Pulse Rate: 110 (02/28 0546) Intake/Output from previous day: 02/27 0701 - 02/28 0700 In: 1301 [I.V.:276; TPN:1025] Out: 2410 [Urine:1500; Emesis/NG output:900; Drains:10] Intake/Output from this shift: Total I/O In: 30 [P.O.:30] Out: 200 [Urine:200]  Labs:  Recent Labs  01/29/13 0503  WBC 11.2*  HGB 13.3  HCT 38.6*  PLT 238     Recent Labs  01/29/13 0503 01/30/13 0550  NA 146* 147*  K 3.2* 3.9  CL 104 108  CO2 34* 34*  GLUCOSE 131* 149*  BUN 22 24*  CREATININE 0.99 0.96  CALCIUM 9.4 9.3  MG 2.5  --   PHOS 2.5  --   PROT 6.9  --   ALBUMIN 3.1*  --   AST 26  --   ALT 20  --   ALKPHOS 59  --   BILITOT 0.8  --   PREALBUMIN 13.9*  --   TRIG 116  --   CHOL 86  --    Estimated Creatinine Clearance: 84.3 ml/min (by C-G formula based on Cr of 0.96).    Recent Labs  01/29/13 1804 01/29/13 2357 01/30/13 0552  GLUCAP 149* 166* 147*    Medical History: Past Medical History  Diagnosis Date  . CHF (congestive heart failure)     ischemic  . 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 Dyslipidemia   . HTN (hypertension)   . Atrial fibrillation   . Cancer     around nose       Insulin Requirements in the past 24 hours:  5 units Novolog sensitive SSI q6h  Current Nutrition:  NPO Clinimix E 5/20 at 35ml/hr with Lipids 20% at 28ml/hr MWF  Assessment: 66 YOM presented 01/22/13 with recurrent ventral hernia, s/p repair, POD#7.  Reglan was added for ileus and Phenergan has helped his nausea.  Patient to remain on bowel rest and Pharmacy consulted to manage TPN for prolonged ileus.   GI: TPN for  ileus s/p ventral hernia repair, significant NG O/P - CT today.  BM today, changed to clear liq diet, some flatus, to continue tpn; tolerating well at goal rate Endo: no hx DM - CBG<150,  Lytes: Na elevated but will decrease on Clinimix (0.2NS) other lytes wnl Renal: SCr 1, good UOP, D5NS with KCL at kvo Pulm: transitioned to RA 2/26 Cards: hx CHF / CAD / HLD / AFib / HTN - BP elevated, HR normal - on IV Lasix, coreg, hypertensive bp 164/84 Anticoag: Coumadin PTA for hx AFib, currently on hold d/t ileus - INR 1.35, hgb stable, plts WNL; lovenox 30mg  daily Hepatobil: LFT wnl Neuro: pca d/c'd ID: afebrile, WBC mildly elevated    Best Practices: Lovenox, MC TPN Access: PICC 01/27/13 TPN day#: 2 (2/26 >> )   Nutritional Goals:  1900 - 2000 kCal, 95 - 110 grams of protein per day Clinimix goal rate is E 5/20 at 83 ml/hr. Would provide 1966kcal and 100g protein on daily average to meet caloric and protein requirements.  Plan:  - Continue Clinimix E 5/20 at 83 ml/hr - Lipidss MWF d/t national shortage - start po MVI as diet has  progressed - Cont SSI Q6H - Consider adding IV hydralazine for better BP control and increase Lovenox to 50mg  SQ Q24H for VTE px in patient with BMI > 30 - F/U AM labs  Verlene Mayer, PharmD, BCPS Pager [phone] 01/30/2013, 10:29 AM

## 2013-01-30 NOTE — Progress Notes (Signed)
Physical Therapy Treatment Patient Details Name: Alejandro [name] MRN: [id number] DOB: 18-Apr-1946 Today's Date: 01/30/2013 Time: 7829-5621 PT Time Calculation (min): 23 min  PT Assessment / Plan / Recommendation Comments on Treatment Session  Continues to improve in mobility.  Encouraged pt. to ask for assist up into chair for each meal now that he is getting a tray (currently liquids).    Follow Up Recommendations  Home health PT;Other (comment) (dependent on progress)     Does the patient have the potential to tolerate intense rehabilitation     Barriers to Discharge        Equipment Recommendations  Rolling walker with 5" wheels    Recommendations for Other Services    Frequency Min 3X/week   Plan Discharge plan remains appropriate    Precautions / Restrictions Precautions Precautions: Fall Precaution Comments: abdominal binder Restrictions Weight Bearing Restrictions: No   Pertinent Vitals/Pain HR 109 at rest, up to 130 per telemetry during ambulation    Mobility  Bed Mobility Bed Mobility: Supine to Sit;Sit to Supine Supine to Sit: 5: Supervision;HOB elevated Sit to Supine: 5: Supervision;HOB elevated Details for Bed Mobility Assistance: pt. managing well Transfers Transfers: Sit to Stand;Stand to Sit Sit to Stand: 5: Supervision;With upper extremity assist;From bed Stand to Sit: 5: Supervision;With upper extremity assist;To bed Details for Transfer Assistance: assist to manage lines, othewrwise pt. managing transfers himself Ambulation/Gait Ambulation/Gait Assistance: 4: Min guard Ambulation Distance (Feet): 120 Feet Assistive device: Other (Comment) (pushing his IV pole) Ambulation/Gait Assistance Details: small steps possibly due to managing own IV poel and keeping feet away from pole Gait Pattern: Step-through pattern;Decreased stride length;Shuffle Gait velocity: decreased Stairs: No Wheelchair Mobility Wheelchair Mobility: No    Exercises     PT  Diagnosis:    PT Problem List:   PT Treatment Interventions:     PT Goals Acute Rehab PT Goals PT Goal Formulation: With patient Time For Goal Achievement: 02/02/13 Potential to Achieve Goals: Good Pt will go Supine/Side to Sit: with modified independence PT Goal: Supine/Side to Sit - Progress: Progressing toward goal Pt will go Sit to Supine/Side: with modified independence PT Goal: Sit to Supine/Side - Progress: Progressing toward goal Pt will go Sit to Stand: with modified independence PT Goal: Sit to Stand - Progress: Progressing toward goal Pt will go Stand to Sit: with modified independence PT Goal: Stand to Sit - Progress: Progressing toward goal Pt will Ambulate: 51 - 150 feet;with modified independence;with least restrictive assistive device PT Goal: Ambulate - Progress: Progressing toward goal  Visit Information  Last PT Received On: 01/30/13 Assistance Needed: +1    Subjective Data  Subjective: "I'm hoping to get to go home this weekend"   Cognition  Cognition Overall Cognitive Status: Appears within functional limits for tasks assessed/performed Arousal/Alertness: Awake/alert Orientation Level: Appears intact for tasks assessed Behavior During Session: [facility] for tasks performed    Balance     End of Session PT - End of Session Equipment Utilized During Treatment: Gait belt Activity Tolerance: Patient tolerated treatment well Patient left: in bed;with call bell/phone within reach;with family/visitor present Nurse Communication: Mobility status   GP     Alejandro [name] 01/30/2013, 4:00 PM Weldon Picking PT Acute Rehab Services [phone] [place] [phone]

## 2013-01-30 NOTE — Progress Notes (Signed)
8 Days Post-Op  Subjective: HAD BM TODAY  Objective: Vital signs in last 24 hours: Temp:  [97.5 F (36.4 C)-98.4 F (36.9 C)] 97.5 F (36.4 C) (02/28 0546) Pulse Rate:  [86-129] 110 (02/28 0546) Resp:  [12-18] 18 (02/28 0546) BP: (140-169)/(68-118) 164/84 mmHg (02/28 0546) SpO2:  [94 %-99 %] 98 % (02/28 0546) FiO2 (%):  [35 %] 35 % (02/27 1931) Last BM Date: 01/21/13  Intake/Output from previous day: 02/27 0701 - 02/28 0700 In: 1301 [I.V.:276; TPN:1025] Out: 2410 [Urine:1500; Emesis/NG output:900; Drains:10] Intake/Output this shift: Total I/O In: 30 [P.O.:30] Out: -   Incision/Wound:CLEAN DRY INTACT.  SOFT  Lab Results:   Recent Labs  01/29/13 0503  WBC 11.2*  HGB 13.3  HCT 38.6*  PLT 238   BMET  Recent Labs  01/29/13 0503 01/30/13 0550  NA 146* 147*  K 3.2* 3.9  CL 104 108  CO2 34* 34*  GLUCOSE 131* 149*  BUN 22 24*  CREATININE 0.99 0.96  CALCIUM 9.4 9.3   PT/INR No results found for this basename: LABPROT, INR,  in the last 72 hours ABG No results found for this basename: PHART, PCO2, PO2, HCO3,  in the last 72 hours  Studies/Results: Ct Abdomen Pelvis Wo Contrast  01/28/2013  *RADIOLOGY REPORT*  Clinical Data: Abdominal distension and pain following hernia repair.  CT ABDOMEN AND PELVIS WITHOUT CONTRAST  Technique:  Multidetector CT imaging of the abdomen and pelvis was performed following the standard protocol without intravenous contrast.  Comparison: CT of the abdomen and pelvis 10/27/2012.  Findings:  Lung Bases: Trace left and small right-sided pleural effusions. Volume loss and architectural distortion in the right lower lobe favored to represent a combination of atelectasis and consolidation.  The right lower lobe bronchi appear to be filled with aspirated material or endobronchial debris. Heart size is mildly enlarged. Atherosclerotic calcifications in the left anterior descending, left circumflex and right coronary arteries. Status post median  sternotomy.  Abdomen/Pelvis:  A nasogastric tube is seen extending into the second portion of the duodenum.  There are postoperative changes of mesh repair for the previously noted ventral hernia.  No evidence of recurrent herniation.  There is a surgical drain in the soft tissues of the anterior abdominal wall immediately adjacent to the surgical site.  Overlying skin staples are noted.  No gas or well- defined fluid collection is identified within the soft tissues of the anterior abdominal wall this time. A tiny locule of gas is tiny locules of gas are noted in the soft tissues of the flank bilaterally, presumably iatrogenic.  There are some mildly dilated loops of fluid-filled small bowel measuring up to 4.2 cm in diameter.  However, there is gas and stool noted throughout the colon including the distal rectum.  The unenhanced appearance of the liver, gallbladder, pancreas, spleen and bilateral adrenal glands is unremarkable.  Numerous calcifications are noted in the renal hila bilaterally, favored to be vascular calcifications (these may alternatively represent nonobstructive calculi).  Bilateral perinephric stranding is nonspecific, but increased compared to the prior examination, and can be seen in the setting of renal insufficiency.  Extensive atherosclerosis throughout the abdominal and pelvic vasculature, without definite aneurysm.  Small amount of presacral edema.  No significant volume of ascites.  No pneumoperitoneum.   No definite pathologic lymphadenopathy identified within the abdomen or pelvis on this noncontrast CT examination. Urinary bladder is largely decompressed with Foley balloon catheter in place, and a small amount of gas in the nondependent portion  of the urinary bladder which is iatrogenic.  Musculoskeletal: Sternotomy wires. There are no aggressive appearing lytic or blastic lesions noted in the visualized portions of the skeleton.  IMPRESSION: 1.  Postoperative changes related to recent  mesh repair for ventral hernia without definite complicating features. 2.  Mild dilatation of some portions of the mid small bowel, as above, is nonspecific, but fail to represent a mild postoperative ileus. 3.  Trace left and small right-sided pleural effusions with a combination of atelectasis and consolidation in the right lower lobe, and findings suggestive of aspirated material or endobronchial debris in the right lower lobe bronchi. 4. Atherosclerosis, including three vessel coronary artery disease. Assessment for potential risk factor modification, dietary therapy or pharmacologic therapy may be warranted, if clinically indicated.   Original Report Authenticated By: Trudie Reed, M.D.     Anti-infectives: Anti-infectives   Start     Dose/Rate Route Frequency Ordered Stop   01/22/13 0600  ceFAZolin (ANCEF) IVPB 2 g/50 mL premix     2 g 100 mL/hr over 30 Minutes Intravenous On call to O.R. 01/21/13 1335 01/22/13 0800      Assessment/Plan: s/p Procedure(s) with comments: HERNIA REPAIR VENTRAL ADULT (N/A) CYSTOSCOPY (N/A) - Cystoscopy with balloon dilation. Insertion of coude catheter. APPLICATION OF A-CELL OF CHEST/ABDOMEN (N/A) Clear liquid diet D/C NGT Restart po betablocker and stop IV beta blocker.  This should help heart rate.   D/C PCA  LOS: 8 days    Alejandro [name] A. 01/30/2013

## 2013-01-30 NOTE — Progress Notes (Signed)
UR completed 

## 2013-01-31 LAB — GLUCOSE, CAPILLARY

## 2013-01-31 MED ORDER — ENOXAPARIN SODIUM 40 MG/0.4ML ~~LOC~~ SOLN
40.0000 mg | SUBCUTANEOUS | Status: DC
  Administered 2013-02-01: 40 mg via SUBCUTANEOUS
  Filled 2013-01-31 (×2): qty 0.4

## 2013-01-31 MED ORDER — OXYCODONE-ACETAMINOPHEN 5-325 MG PO TABS
1.0000 | ORAL_TABLET | ORAL | Status: DC | PRN
Start: 2013-01-31 — End: 2013-02-01

## 2013-01-31 NOTE — Progress Notes (Signed)
PARENTERAL NUTRITION CONSULT NOTE - Follow up  Pharmacy Consult:  TPN Indication:  Prolonged ileus  Patient receiving TPN day #4, ileus resolved and diet advanced. Will wean TPN today and d/c this afternoon. Pharmacy will sign off.  Verlene Mayer, PharmD, BCPS Pager [phone]     No Known Allergies  Patient Measurements: Height: 5\' 8"  (172.7 cm) Weight: 213 lb 10 oz (96.9 kg) IBW/kg (Calculated) : 68.4  Vital Signs: Temp: 97.8 F (36.6 C) (03/01 0627) Temp src: Oral (03/01 0627) BP: 140/70 mmHg (03/01 0627) Pulse Rate: 90 (03/01 0627) Intake/Output from previous day: 02/28 0701 - 03/01 0700 In: 3464.9 [P.O.:550; I.V.:669.7; TPN:2245.2] Out: 1050 [Urine:1025; Drains:25] Intake/Output from this shift:    Labs:  Recent Labs  01/29/13 0503  WBC 11.2*  HGB 13.3  HCT 38.6*  PLT 238     Recent Labs  01/29/13 0503 01/30/13 0550  NA 146* 147*  K 3.2* 3.9  CL 104 108  CO2 34* 34*  GLUCOSE 131* 149*  BUN 22 24*  CREATININE 0.99 0.96  CALCIUM 9.4 9.3  MG 2.5  --   PHOS 2.5  --   PROT 6.9  --   ALBUMIN 3.1*  --   AST 26  --   ALT 20  --   ALKPHOS 59  --   BILITOT 0.8  --   PREALBUMIN 13.9*  --   TRIG 116  --   CHOL 86  --    Estimated Creatinine Clearance: 84.3 ml/min (by C-G formula based on Cr of 0.96).    Recent Labs  01/30/13 1149 01/30/13 1730 01/30/13 2356  GLUCAP 143* 179* 182*    Medical History: Past Medical History  Diagnosis Date  . CHF (congestive heart failure)     ischemic  . 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 Dyslipidemia   . HTN (hypertension)   . Atrial fibrillation   . Cancer     around nose       Insulin Requirements in the past 24 hours:  5 units Novolog sensitive SSI q6h  Current Nutrition:  NPO Clinimix E 5/20 at 2ml/hr with Lipids 20% at 60ml/hr MWF  Assessment: 66 YOM presented 01/22/13 with recurrent ventral hernia, s/p repair, POD#7.  Reglan was added for ileus and  Phenergan has helped his nausea.  Patient to remain on bowel rest and Pharmacy consulted to manage TPN for prolonged ileus.   GI: TPN for ileus s/p ventral hernia repair, significant NG O/P - CT today.  BM today, changed to clear liq diet, some flatus, to continue tpn; tolerating well at goal rate Endo: no hx DM - CBG<150,  Lytes: Na elevated but will decrease on Clinimix (0.2NS) other lytes wnl Renal: SCr 1, good UOP, D5NS with KCL at kvo Pulm: transitioned to RA 2/26 Cards: hx CHF / CAD / HLD / AFib / HTN - BP elevated, HR normal - on IV Lasix, coreg, hypertensive bp 164/84 Anticoag: Coumadin PTA for hx AFib, currently on hold d/t ileus - INR 1.35, hgb stable, plts WNL; lovenox 30mg  daily Hepatobil: LFT wnl Neuro: pca d/c'd ID: afebrile, WBC mildly elevated    Best Practices: Lovenox, MC TPN Access: PICC 01/27/13 TPN day#: 2 (2/26 >> )   Nutritional Goals:  1900 - 2000 kCal, 95 - 110 grams of protein per day Clinimix goal rate is E 5/20 at 83 ml/hr. Would provide 1966kcal and 100g protein on daily average to meet caloric and  protein requirements.  Plan:  - Continue Clinimix E 5/20 at 83 ml/hr - Lipidss MWF d/t national shortage - start po MVI as diet has progressed - Cont SSI Q6H - Consider adding IV hydralazine for better BP control and increase Lovenox to 50mg  SQ Q24H for VTE px in patient with BMI > 30 - F/U AM labs  Verlene Mayer, PharmD, BCPS Pager [phone] 01/31/2013, 10:34 AM

## 2013-01-31 NOTE — Progress Notes (Addendum)
9 Days Post-Op  Subjective: Alert. No distress. 2 semi-formed bowel movements yesterday. Tolerating clear liquids. Minimal pain.  Foley catheter remains in place. This was difficult and required cystoscopy with balloon dilatation of bulbar urethral stricture by Dr. Vernie Ammons at the time of his abdominal surgery. Per Dr. Margrett Rud note, this can be removed.  Remains in atrial fibrillation. Heart rate 90. On Coreg.  Objective: Vital signs in last 24 hours: Temp:  [97.5 F (36.4 C)-98.6 F (37 C)] 97.8 F (36.6 C) (03/01 0627) Pulse Rate:  [71-91] 90 (03/01 0627) Resp:  [18-20] 18 (03/01 0627) BP: (96-146)/(63-75) 140/70 mmHg (03/01 0627) SpO2:  [92 %-98 %] 98 % (03/01 0627) Last BM Date: 01/30/13  Intake/Output from previous day: 02/28 0701 - 03/01 0700 In: 2427.3 [P.O.:550; I.V.:486.7; TPN:1390.6] Out: 1050 [Urine:1025; Drains:25] Intake/Output this shift: Total I/O In: 1717.3 [I.V.:326.7; TPN:1390.6] Out: 500 [Urine:475; Drains:25]  General appearance: alert. No distress. Oriented and cooperative. GI: borderline obese. Slightly distended. Soft with minimal tenderness. Active bowel sounds. Midline wound clean. JP drain functioning with serosanguineous drainage.  Lab Results:  Results for orders placed during the hospital encounter of 01/22/13 (from the past 24 hour(s))  GLUCOSE, CAPILLARY     Status: Abnormal   Collection Time    01/30/13 11:49 AM      Result Value Range   Glucose-Capillary 143 (*) 70 - 99 mg/dL  GLUCOSE, CAPILLARY     Status: Abnormal   Collection Time    01/30/13  5:30 PM      Result Value Range   Glucose-Capillary 179 (*) 70 - 99 mg/dL  GLUCOSE, CAPILLARY     Status: Abnormal   Collection Time    01/30/13 11:56 PM      Result Value Range   Glucose-Capillary 182 (*) 70 - 99 mg/dL     Studies/Results: @RISRSLT24 @  . carvedilol  25 mg Oral BID WC  . enoxaparin (LOVENOX) injection  30 mg Subcutaneous Q24H  . furosemide  20 mg Intravenous Daily  .  insulin aspart  0-9 Units Subcutaneous Q6H  . multivitamin with minerals  1 tablet Oral Daily     Assessment/Plan: s/p Procedure(s): HERNIA REPAIR VENTRAL ADULT CYSTOSCOPY APPLICATION OF A-CELL OF CHEST/ABDOMEN  POD #9. Ventral hernia repair with biologic mesh.   ileus finally resolving Advance diet Possible discharge tomorrow  Atrial fibrillation. Rate controlled on beta blocker. Continue telemetry  Urethral stricture, status post cystoscopy and dilatation. Foley catheter can be removed per Dr. Margrett Rud instructions on op note. Discontinue Foley catheter today for voiding trial.   @PROBHOSP @  LOS: 9 days     Fehnel M. Derrell Lolling, M.D., [facility] General and Minimally invasive Surgery Breast and Colorectal Surgery Office:   [phone] Pager:   [phone]  01/31/2013  . .prob

## 2013-02-01 LAB — GLUCOSE, CAPILLARY
Glucose-Capillary: 102 mg/dL — ABNORMAL HIGH (ref 70–99)
Glucose-Capillary: 95 mg/dL (ref 70–99)

## 2013-02-01 MED ORDER — OXYCODONE-ACETAMINOPHEN 7.5-325 MG PO TABS: 1.0000 | ORAL_TABLET | ORAL | Status: DC | PRN

## 2013-02-01 NOTE — Discharge Summary (Signed)
Patient ID: LAWSEN ARNOTT [id number] 67 y.o. 06-11-1946  01/22/2013  Discharge date and time: February 01, 2013   Admitting Physician: Ernestene Mention  Discharge Physician: Ernestene Mention  Admission Diagnoses: ventral hernia  Discharge Diagnoses: Ventral incisional hernia Postoperative ileus, resolved Bulbar urethral stricture Chronic atrial fibrillation Chronic systolic heart failure Coronary artery disease Status post coronary artery bypass grafting Hypertension  Operations: Procedure(s): HERNIA REPAIR VENTRAL ADULT CYSTOSCOPY APPLICATION OF A-CELL OF CHEST/ABDOMEN  Admission Condition: good  Discharged Condition: good  Indication for Admission: The patient is a 67 year old white male who recently saw Dr. Abbey Chatters In our practice. He had a history of an incarcerated umbilical hernia that was repaired by Dr. Carolynne Edouard  in March of 2010. The patient states that about 6 months to a year after that surgery he started noticing some bulging again at his bellybutton. Around the same time in 2010 he also had cardiac bypass surgery. Over the last 2 years he has had more bulging to the right of his umbilicus. He notes occasionally that he will have some mild discomfort associated with it. He denies any nausea or vomiting. The hernia is always soft but doesn't completely go back and anymore. Examination in the office revealed a large ventral hernia just to the right of the umbilicus. It was soft without signs of obstruction there was some slight ulceration of skin overlying the hernia.   Hospital Course: On the day of admission the patient was taken to the operating room and underwent laparoscopic assisted Ventral hernia repair with  biologic mesh. This required both laparoscopic and open techniques as a hybrid procedure. He had a bulbar urethral stricture requiring cystoscopy by Dr. Vernie Ammons  for Foley catheter placement. The stricture was dilated. Postoperatively he had a prolonged ileus that  required nasogastric suction and intravenous nutritional support with TNA. He was kept on Lovenox because of DVT prophylaxis and his history of chronic atrial ablation. He was maintained on his usual meds and beta blockers.After several days a CT scan of the abdomen was obtained which showed no complicating features of the surgery and no mechanical problem.Eventually his ileus resolved and he began passing flatus and then the stool and the nasogastric tube was removed and we rapidly advanced into a regular diet. On the day of discharge he was tolerating a regular diet, had had bowel movements. He felt well and wanted to go home. His Foley catheter had been removed and he was voiding normally. He was informed of the cystoscopy and urethral stricture dilatation. On the day of discharge his abdomen was soft. Midline incision was healing without difficulty. JP drainage was serosanguineous at about 25 cc per day. I elected to leave the staples and the drain in place. He was instructed to return to see Dr. Carolynne Edouard  in 4-5 days. He was told to restart his Coumadin today. He has an appointment with the [facility] on March 7. He was given a prescription for Percocet for pain. He was told to resume all of his usual medications. Diet and activities were discussed.  Consults: None  Significant Diagnostic Studies: CT scan of the abdomen. Cystoscopy.  Treatments: surgery: Laparoscopic assisted repair of ventral incisional hernia with biologic mesh, cystoscopy and dilatation of bulbar urethral stricture  Disposition: Home  Patient Instructions:    Medication List    TAKE these medications       aspirin 81 MG tablet  Take 81 mg by mouth daily.     carvedilol 12.5 MG tablet  Commonly known as:  COREG  Take 25 mg by mouth 2 (two) times daily with a meal.     furosemide 40 MG tablet  Commonly known as:  LASIX  Take 20 mg by mouth daily.     ibuprofen 200 MG tablet  Commonly known as:  ADVIL,MOTRIN   Take 400 mg by mouth every 6 (six) hours as needed for pain.     lisinopril 20 MG tablet  Commonly known as:  PRINIVIL,ZESTRIL  Take 20 mg by mouth daily.     oxyCODONE-acetaminophen 7.5-325 MG per tablet  Commonly known as:  PERCOCET  Take 1 tablet by mouth every 4 (four) hours as needed for pain.     simvastatin 40 MG tablet  Commonly known as:  ZOCOR  Take 40 mg by mouth every evening.     warfarin 2 MG tablet  Commonly known as:  COUMADIN  Take 2-4 mg by mouth See admin instructions. Takes 4 mg on Monday and 2 mg all other days of the week.        Activity: no heavy lifting for 6 weeks Diet: low fat, low cholesterol diet Wound Care: as directed  Follow-up:  With Dr.Toth in 5 days.  Signed: Angelia Mould. Derrell Lolling, M.D., FACS General and minimally invasive surgery Breast and Colorectal Surgery  02/01/2013, 7:48 AM

## 2013-02-01 NOTE — Progress Notes (Signed)
IN TO REMOVE RIGHT ARM PICC PER MD ORDER, LENGTH OF 42 CM'S, SITE WNL'S, VASELINE GAUZE AND HYPOFIX PRESSURE DRESSING APPLIED WITH TEN MINUTES OF MANUAL PRESSURE HELD, NO ACTIVE BLEEDING NOTED, PT INSTRUCTED TO LEAVE DRESSING ON FOR TWENTY-FOUR HOURS AND SHOULD BLEEDING OCCUR TO HOLD PRESSURE AND IF BLEEDING DOES NOT STOP TO CALL BACK TO HOSPITAL FOR INSTRUCTIONS, ALSO TO KEEP DRESSING DRY

## 2013-02-01 NOTE — Progress Notes (Signed)
10 Days Post-Op  Subjective: Feels good and wants to go home. Tolerating diet and having bowel movements. Pain well-controlled. Ambulating in halls. Voiding normally.  Heart rate 82 on bedside exam  JP drainage serosanguineous, 25 cc per 24 hours  Objective: Vital signs in last 24 hours: Temp:  [98 F (36.7 C)-98.7 F (37.1 C)] 98.2 F (36.8 C) (03/02 0624) Pulse Rate:  [82-107] 82 (03/02 0624) Resp:  [17-18] 17 (03/02 0624) BP: (112-132)/(81-99) 132/99 mmHg (03/02 0624) SpO2:  [97 %-98 %] 97 % (03/02 0624) Last BM Date: 02/01/13  Intake/Output from previous day: 03/01 0701 - 03/02 0700 In: 1672 [P.O.:940; I.V.:160; TPN:572] Out: 1025 [Urine:1000; Drains:25] Intake/Output this shift:    General appearance: Alert. No distress. Cooperative. Mental status normal. GI:  Abdomen soft. Active bowel sounds. Midline incision clean. Staples intact. Drain site looks good. Drainage thin serosanguineous.  Lab Results:  Results for orders placed during the hospital encounter of 01/22/13 (from the past 24 hour(s))  GLUCOSE, CAPILLARY     Status: Abnormal   Collection Time    01/31/13 11:38 AM      Result Value Range   Glucose-Capillary 194 (*) 70 - 99 mg/dL   Comment 1 Notify RN    GLUCOSE, CAPILLARY     Status: None   Collection Time    01/31/13  5:42 PM      Result Value Range   Glucose-Capillary 91  70 - 99 mg/dL   Comment 1 Notify RN    GLUCOSE, CAPILLARY     Status: Abnormal   Collection Time    01/31/13 11:56 PM      Result Value Range   Glucose-Capillary 102 (*) 70 - 99 mg/dL  GLUCOSE, CAPILLARY     Status: None   Collection Time    02/01/13  6:23 AM      Result Value Range   Glucose-Capillary 95  70 - 99 mg/dL     Studies/Results: @RISRSLT24 @  . carvedilol  25 mg Oral BID WC  . enoxaparin (LOVENOX) injection  40 mg Subcutaneous Q24H  . furosemide  20 mg Intravenous Daily  . multivitamin with minerals  1 tablet Oral Daily     Assessment/Plan: s/p  Procedure(s): HERNIA REPAIR VENTRAL ADULT CYSTOSCOPY APPLICATION OF A-CELL OF CHEST/ABDOMEN  POD #10. Ventral hernia repair with biologic mesh. Ileus resolved Discharge home today. See Dr. Carolynne Edouard  in the office in 4-5 days for staple removal and hopefully drain removal Diet and activities discussed. Resume Coumadin. [facility] Prescription for Percocet given. Told to use sparingly.  Atrial fibrillation. Rate controlled on beta blocker/home meds  Urethral stricture, status post cystoscopy and dilatation.  Voiding normally following Foley removal   @PROBHOSP @  LOS: 10 days    Alejandro [name],Alejandro [name] 02/01/2013  . .prob

## 2013-02-02 ENCOUNTER — Ambulatory Visit (INDEPENDENT_AMBULATORY_CARE_PROVIDER_SITE_OTHER): Payer: BC Managed Care – PPO | Admitting: General Surgery

## 2013-02-02 ENCOUNTER — Encounter (INDEPENDENT_AMBULATORY_CARE_PROVIDER_SITE_OTHER): Payer: Self-pay | Admitting: General Surgery

## 2013-02-02 VITALS — BP 134/68 | HR 76 | Temp 97.8°F | Resp 18 | Ht 68.0 in | Wt 223.0 lb

## 2013-02-02 DIAGNOSIS — Z4889 Encounter for other specified surgical aftercare: Secondary | ICD-10-CM

## 2013-02-02 DIAGNOSIS — Z4802 Encounter for removal of sutures: Secondary | ICD-10-CM

## 2013-02-02 NOTE — Progress Notes (Signed)
Patient came into office for nurse only visit for drain and staple removal. The drain removed had less than 1cc of fluid. The drain removed without any complication or discomfort to the patient. The fluid and drain sight did not show any signs of infection. There were a total of 32 staples removed. All staples removed without complication. The incision is well approximated and healing well, no signs of infection were present. Dr. Magnus Ivan came into the exam room and looked at the incision site to confirm. There were a total of 9 steri strips were applied in order to make sure the incision site continues to heal.  Patient already scheduled to see Dr. Carolynne Edouard on 02/27/13.

## 2013-02-02 NOTE — Patient Instructions (Signed)
Your incision site is healing well. Your drain removed did not show any signs of infection. Please be sure to only use anti-bacterial soap until your incisions have completely healed. If you develop any signs of infection, fever, or if the amount of drainage from the drain site increases please call our office immediately. Please make sure to keep your appointment with Dr. Carolynne Edouard. If you develop any complications please call to be seen sooner.

## 2013-02-06 ENCOUNTER — Ambulatory Visit: Payer: BC Managed Care – PPO | Admitting: Cardiovascular Disease

## 2013-02-10 ENCOUNTER — Encounter: Payer: Self-pay | Admitting: Cardiovascular Disease

## 2013-02-10 ENCOUNTER — Ambulatory Visit (INDEPENDENT_AMBULATORY_CARE_PROVIDER_SITE_OTHER): Payer: BC Managed Care – PPO | Admitting: Cardiovascular Disease

## 2013-02-10 ENCOUNTER — Ambulatory Visit (INDEPENDENT_AMBULATORY_CARE_PROVIDER_SITE_OTHER): Payer: BC Managed Care – PPO | Admitting: Pharmacist

## 2013-02-10 VITALS — BP 94/57 | HR 89 | Ht 68.0 in | Wt 217.6 lb

## 2013-02-10 DIAGNOSIS — I4891 Unspecified atrial fibrillation: Secondary | ICD-10-CM

## 2013-02-10 DIAGNOSIS — I5022 Chronic systolic (congestive) heart failure: Secondary | ICD-10-CM

## 2013-02-10 DIAGNOSIS — R0602 Shortness of breath: Secondary | ICD-10-CM

## 2013-02-10 LAB — POCT INR: INR: 1.8

## 2013-02-10 NOTE — Progress Notes (Signed)
   HPI:  67 year old gentleman presenting for followup evaluation. The patient is followed for atrial fibrillation, congestive heart failure, and coronary artery disease. He underwent coronary bypass surgery a few years ago after presenting with severe congestive heart failure. He has a history of long-standing tobacco and alcohol use, but has cut back on alcohol significantly and has quit smoking completely. His atrial fibrillation is managed with the strategy of rate control and anticoagulation. He recently underwent surgical repair of a ventral hernia. Since that time he has felt poorly. His shortness of breath has been worse. He denies chest pain or pressure. He has not had leg swelling but his abdomen has felt tight. He denies orthopnea or PND.  Outpatient Encounter Prescriptions as of 02/10/2013  Medication Sig Dispense Refill  . carvedilol (COREG) 12.5 MG tablet Take 25 mg by mouth 2 (two) times daily with a meal.      . furosemide (LASIX) 40 MG tablet Take 20 mg by mouth daily.       Marland Kitchen ibuprofen (ADVIL,MOTRIN) 200 MG tablet Take 400 mg by mouth every 6 (six) hours as needed for pain.      Marland Kitchen lisinopril (PRINIVIL,ZESTRIL) 20 MG tablet Take 20 mg by mouth daily.      Marland Kitchen oxyCODONE-acetaminophen (PERCOCET) 7.5-325 MG per tablet Take 1 tablet by mouth every 4 (four) hours as needed for pain.  30 tablet  0  . simvastatin (ZOCOR) 40 MG tablet Take 40 mg by mouth every evening.      . warfarin (COUMADIN) 2 MG tablet Take 2-4 mg by mouth See admin instructions. Takes 4 mg on Monday and 2 mg all other days of the week.      . [DISCONTINUED] aspirin 81 MG tablet Take 81 mg by mouth daily.         No facility-administered encounter medications on file as of 02/10/2013.    No Known Allergies  Past Medical History  Diagnosis Date  . CHF (congestive heart failure)     ischemic  . 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 Dyslipidemia   . HTN (hypertension)   . Atrial fibrillation     . Cancer     around nose    ROS: Negative except as per HPI  BP 94/57  Pulse 89  Ht 5\' 8"  (1.727 m)  Wt 98.703 kg (217 lb 9.6 oz)  BMI 33.09 kg/m2  PHYSICAL EXAM: Pt is alert and oriented, overweight male in NAD HEENT: normal Neck: JVP - normal, carotids 2+= without bruits Lungs: CTA bilaterally CV: Irregularly irregular without murmur or gallop Abd: soft, NT, Positive BS, no hepatomegaly Ext: Trace edema bilaterally, distal pulses intact and equal Skin: warm/dry no rash  ASSESSMENT AND PLAN: 1. Chronic mixed systolic and diastolic heart failure. Blood pressure is lower than normal today and he is feeling somewhat weak and fatigued. Will check laboratory work to include a CBC, metabolic panel, and BNP. We'll repeat his echocardiogram to evaluate for progressive LV dysfunction. Further plans pending the results of these tests. Will continue his same medical program for now.  2. Chronic atrial fibrillation. The patient is anticoagulated with warfarin. Heart rate is reasonably controlled.  3. Coronary artery disease status post CABG. No ischemic symptoms at the present time. Continue current medications which include a statin drug and beta blocker. Since the patient is anticoagulated with warfarin, I have asked him to discontinue aspirin.  Tonny Bollman 02/12/2013 11:30 PM

## 2013-02-10 NOTE — Patient Instructions (Addendum)
Your physician has requested that you have an echocardiogram. Echocardiography is a painless test that uses sound waves to create images of your heart. It provides your doctor with information about the size and shape of your heart and how well your heart's chambers and valves are working. This procedure takes approximately one hour. There are no restrictions for this procedure.  Your physician recommends that you schedule a follow-up appointment in: 4 WEEKS with Dr Excell Seltzer  Your physician recommends that you have lab work today: BMP, BNP and CBC  Your physician has recommended you make the following change in your medication: STOP Aspirin

## 2013-02-11 LAB — BASIC METABOLIC PANEL
BUN: 17 mg/dL (ref 6–23)
CO2: 24 mEq/L (ref 19–32)
Chloride: 100 mEq/L (ref 96–112)
Glucose, Bld: 88 mg/dL (ref 70–99)
Potassium: 4 mEq/L (ref 3.5–5.1)
Sodium: 132 mEq/L — ABNORMAL LOW (ref 135–145)

## 2013-02-11 LAB — CBC WITH DIFFERENTIAL/PLATELET
Basophils Absolute: 0 10*3/uL (ref 0.0–0.1)
Eosinophils Absolute: 0.3 10*3/uL (ref 0.0–0.7)
HCT: 35.4 % — ABNORMAL LOW (ref 39.0–52.0)
Hemoglobin: 12 g/dL — ABNORMAL LOW (ref 13.0–17.0)
Lymphs Abs: 1.7 10*3/uL (ref 0.7–4.0)
MCHC: 33.8 g/dL (ref 30.0–36.0)
MCV: 88.8 fl (ref 78.0–100.0)
Monocytes Absolute: 0.6 10*3/uL (ref 0.1–1.0)
Monocytes Relative: 6.7 % (ref 3.0–12.0)
Neutro Abs: 6.5 10*3/uL (ref 1.4–7.7)
RDW: 13.4 % (ref 11.5–14.6)

## 2013-02-18 ENCOUNTER — Other Ambulatory Visit (HOSPITAL_COMMUNITY): Payer: BC Managed Care – PPO

## 2013-02-24 ENCOUNTER — Ambulatory Visit (INDEPENDENT_AMBULATORY_CARE_PROVIDER_SITE_OTHER): Payer: BC Managed Care – PPO

## 2013-02-24 ENCOUNTER — Ambulatory Visit (HOSPITAL_COMMUNITY): Payer: BC Managed Care – PPO | Attending: Cardiovascular Disease | Admitting: Radiology

## 2013-02-24 DIAGNOSIS — I509 Heart failure, unspecified: Secondary | ICD-10-CM | POA: Insufficient documentation

## 2013-02-24 DIAGNOSIS — R0602 Shortness of breath: Secondary | ICD-10-CM | POA: Insufficient documentation

## 2013-02-24 DIAGNOSIS — I4891 Unspecified atrial fibrillation: Secondary | ICD-10-CM

## 2013-02-24 DIAGNOSIS — I2589 Other forms of chronic ischemic heart disease: Secondary | ICD-10-CM | POA: Insufficient documentation

## 2013-02-24 DIAGNOSIS — I5022 Chronic systolic (congestive) heart failure: Secondary | ICD-10-CM

## 2013-02-24 MED ORDER — PERFLUTREN PROTEIN A MICROSPH IV SUSP
2.0000 mL | Freq: Once | INTRAVENOUS | Status: AC
  Administered 2013-02-24: 2 mL via INTRAVENOUS

## 2013-02-24 NOTE — Progress Notes (Signed)
Echocardiogram performed with [employer].  

## 2013-02-27 ENCOUNTER — Encounter (INDEPENDENT_AMBULATORY_CARE_PROVIDER_SITE_OTHER): Payer: Self-pay

## 2013-02-27 ENCOUNTER — Encounter (INDEPENDENT_AMBULATORY_CARE_PROVIDER_SITE_OTHER): Payer: Self-pay | Admitting: General Surgery

## 2013-02-27 ENCOUNTER — Ambulatory Visit (INDEPENDENT_AMBULATORY_CARE_PROVIDER_SITE_OTHER): Payer: BC Managed Care – PPO | Admitting: General Surgery

## 2013-02-27 VITALS — BP 122/70 | HR 72 | Temp 97.7°F | Resp 14 | Ht 68.0 in | Wt 219.4 lb

## 2013-02-27 DIAGNOSIS — K432 Incisional hernia without obstruction or gangrene: Secondary | ICD-10-CM

## 2013-02-27 NOTE — Progress Notes (Signed)
Subjective:     Patient ID: KOHLER PELLERITO, male   DOB: 1946/09/15, 67 y.o.   MRN: [id number]  HPI The patient is a 67 year old white male who is one month status post ventral hernia repair with biologic mesh. He has done well since the surgery. He has no complaints today. He denies any abdominal pain. His appetite is good and his bowels are working normally.  Review of Systems     Objective:   Physical Exam On exam his abdomen is soft and nontender. His incision is healing nicely with no sign of infection or seroma. His abdominal wall feels solid with no palpable evidence of recurrence of the hernia    Assessment:     The patient is one month status post ventral hernia repair with biologic mesh     Plan:     At this point I would like him to refrain from any heavy lifting for at least another 2 weeks. At that point he can gradually increase his activity level. We will plan to see him back in about one month.

## 2013-02-27 NOTE — Patient Instructions (Signed)
Do not lift anything heavy for another 2 weeks

## 2013-03-10 ENCOUNTER — Ambulatory Visit (INDEPENDENT_AMBULATORY_CARE_PROVIDER_SITE_OTHER): Payer: BC Managed Care – PPO

## 2013-03-10 ENCOUNTER — Encounter: Payer: Self-pay | Admitting: Cardiovascular Disease

## 2013-03-10 ENCOUNTER — Ambulatory Visit (INDEPENDENT_AMBULATORY_CARE_PROVIDER_SITE_OTHER): Payer: BC Managed Care – PPO | Admitting: Cardiovascular Disease

## 2013-03-10 VITALS — BP 100/60 | HR 83 | Ht 68.0 in | Wt 221.0 lb

## 2013-03-10 DIAGNOSIS — I4891 Unspecified atrial fibrillation: Secondary | ICD-10-CM

## 2013-03-10 DIAGNOSIS — I5022 Chronic systolic (congestive) heart failure: Secondary | ICD-10-CM

## 2013-03-10 LAB — POCT INR: INR: 3.3

## 2013-03-10 NOTE — Progress Notes (Signed)
HPI:  67 year old gentleman presenting for followup evaluation. The patient has atrial fibrillation, chronic systolic heart failure, and coronary artery disease status post CABG. He has a long-standing history of tobacco and alcohol use, but has cut back recently. He underwent surgery for ventral hernia repair in February and had a slow recovery. When I saw him back in March, he complained of worsening shortness of breath and abdominal swelling. He was brought back today for followup. An echocardiogram has been done in the meantime. The results are below.  The patient denies chest pain or pressure, edema, orthopnea, or PND. His abdominal swelling has improved. He has chronic exertional dyspnea.  Outpatient Encounter Prescriptions as of 03/10/2013  Medication Sig Dispense Refill  . carvedilol (COREG) 12.5 MG tablet Take 25 mg by mouth 2 (two) times daily with a meal.      . furosemide (LASIX) 40 MG tablet Take 20 mg by mouth daily.       Marland Kitchen ibuprofen (ADVIL,MOTRIN) 200 MG tablet Take 400 mg by mouth every 6 (six) hours as needed for pain.      Marland Kitchen lisinopril (PRINIVIL,ZESTRIL) 20 MG tablet Take 20 mg by mouth daily.      . simvastatin (ZOCOR) 40 MG tablet Take 40 mg by mouth every evening.      . warfarin (COUMADIN) 2 MG tablet Take 2-4 mg by mouth See admin instructions. Takes 4 mg on Monday and 2 mg all other days of the week.       No facility-administered encounter medications on file as of 03/10/2013.    No Known Allergies  Past Medical History  Diagnosis Date  . CHF (congestive heart failure)     ischemic  . 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 Dyslipidemia   . HTN (hypertension)   . Atrial fibrillation   . Cancer     around nose    ROS: Negative except as per HPI  BP 100/60  Pulse 83  Ht 5\' 8"  (1.727 m)  Wt 221 lb (100.245 kg)  BMI 33.61 kg/m2  SpO2 97%  PHYSICAL EXAM: Pt is alert and oriented, obese male in NAD HEENT: normal Neck: JVP - normal,  carotids 2+= without bruits Lungs: CTA bilaterally CV: Irregularly irregular without murmur or gallop Abd: soft, NT, Positive BS, no hepatomegaly Ext: no C/C/E, distal pulses intact and equal Skin: warm/dry no rash  2D Echo: Left ventricle: The echo is technically difficult. Even with Definity contrast, it was difficult to evaluate the LV function. The LV EF appears to be around 35%. The cavity size was mildly dilated. Wall thickness was increased in a pattern of mild LVH. Systolic function was moderately to severely reduced. The estimated ejection fraction was in the range of 30% to 35%.  ------------------------------------------------------------ Aortic valve: Structurally normal valve. Cusp separation was normal. Doppler: Transvalvular velocity was within the normal range. There was no stenosis. No regurgitation.  ------------------------------------------------------------ Aorta: Aortic root: The aortic root was normal in size. Ascending aorta: The ascending aorta was normal in size.  ------------------------------------------------------------ Mitral valve: Structurally normal valve. Leaflet separation was normal. Doppler: Transvalvular velocity was within the normal range. There was no evidence for stenosis. No regurgitation.  ------------------------------------------------------------ Left atrium: The atrium was mildly dilated.  ------------------------------------------------------------ Right ventricle: The cavity size was mildly dilated. Systolic function was mildly reduced.  ------------------------------------------------------------ Pulmonic valve: Doppler: No significant regurgitation.  ------------------------------------------------------------ Tricuspid valve: Doppler: No significant regurgitation.  ------------------------------------------------------------ Pericardium: There was no pericardial  effusion.  ASSESSMENT AND PLAN: 1. Chronic systolic  heart failure. The patient is on appropriate medical program. His symptoms have improved. He is currently [place] Heart Association class II. He was advised to avoid alcohol completely. I will see him back in 6 months. His acoustic windows for echo imaging are very poor. We may need to consider alternative imaging. I will discuss with him when he returns for followup and consider a cardiac MRI at that time for better assessment of LV function.  2. Permanent atrial fibrillation. Rate controlled on current medical therapy. He is anticoagulated with warfarin.  Tonny Bollman 03/10/2013 2:35 PM

## 2013-03-10 NOTE — Patient Instructions (Addendum)
Your physician recommends that you continue on your current medications as directed. Please refer to the Current Medication list given to you today.  Your physician wants you to follow-up in: 6 MONTHS with Dr Cooper.  You will receive a reminder letter in the mail two months in advance. If you don't receive a letter, please call our office to schedule the follow-up appointment.  

## 2013-03-31 ENCOUNTER — Ambulatory Visit (INDEPENDENT_AMBULATORY_CARE_PROVIDER_SITE_OTHER): Payer: BC Managed Care – PPO | Admitting: *Deleted

## 2013-03-31 DIAGNOSIS — I4891 Unspecified atrial fibrillation: Secondary | ICD-10-CM

## 2013-04-13 ENCOUNTER — Encounter (INDEPENDENT_AMBULATORY_CARE_PROVIDER_SITE_OTHER): Payer: BC Managed Care – PPO | Admitting: General Surgery

## 2013-04-16 ENCOUNTER — Other Ambulatory Visit: Payer: Self-pay | Admitting: Cardiovascular Disease

## 2013-04-23 ENCOUNTER — Encounter (INDEPENDENT_AMBULATORY_CARE_PROVIDER_SITE_OTHER): Payer: Self-pay | Admitting: General Surgery

## 2013-04-23 ENCOUNTER — Ambulatory Visit (INDEPENDENT_AMBULATORY_CARE_PROVIDER_SITE_OTHER): Payer: BC Managed Care – PPO | Admitting: General Surgery

## 2013-04-23 VITALS — BP 136/80 | HR 88 | Resp 18 | Ht 68.0 in | Wt 228.4 lb

## 2013-04-23 DIAGNOSIS — K432 Incisional hernia without obstruction or gangrene: Secondary | ICD-10-CM

## 2013-04-23 NOTE — Progress Notes (Signed)
Subjective:     Patient ID: Alejandro [name], male   DOB: Sep 19, 1946, 67 y.o.   MRN: [id number]  HPI The patient is a 67 year old white male who is 3 months status post ventral hernia repair with a biologic material. He still has some occasional soreness of his abdominal wall when he overdoes it with activity. He denies any nausea or vomiting. His appetite is good and his bowels are working normally. Overall is very happy with the result of his repair  Review of Systems     Objective:   Physical Exam On exam his abdomen is soft and nontender. His abdominal wall feels solid with no palpable evidence of recurrence of the hernia. He still has a rectus diastases along the midline of his upper abdomen.    Assessment:     The patient is 3 months status post ventral hernia repair with bilateral mesh     Plan:     At this point he can return to his normal activities without restriction. I would caution him about real heavy lifting or strenuous activity. Otherwise we'll see him back on a when necessary basis

## 2013-04-23 NOTE — Patient Instructions (Signed)
Try to avoid heavy lifting

## 2013-04-28 ENCOUNTER — Ambulatory Visit (INDEPENDENT_AMBULATORY_CARE_PROVIDER_SITE_OTHER): Payer: BC Managed Care – PPO | Admitting: *Deleted

## 2013-04-28 DIAGNOSIS — I4891 Unspecified atrial fibrillation: Secondary | ICD-10-CM

## 2013-04-28 LAB — POCT INR: INR: 2.3

## 2013-05-03 ENCOUNTER — Other Ambulatory Visit: Payer: Self-pay | Admitting: Cardiovascular Disease

## 2013-06-02 ENCOUNTER — Ambulatory Visit (INDEPENDENT_AMBULATORY_CARE_PROVIDER_SITE_OTHER): Payer: BC Managed Care – PPO | Admitting: *Deleted

## 2013-06-02 DIAGNOSIS — I4891 Unspecified atrial fibrillation: Secondary | ICD-10-CM

## 2013-06-14 ENCOUNTER — Other Ambulatory Visit: Payer: Self-pay | Admitting: Cardiovascular Disease

## 2013-07-13 ENCOUNTER — Other Ambulatory Visit: Payer: Self-pay | Admitting: Cardiovascular Disease

## 2013-07-14 ENCOUNTER — Ambulatory Visit (INDEPENDENT_AMBULATORY_CARE_PROVIDER_SITE_OTHER): Payer: BC Managed Care – PPO | Admitting: *Deleted

## 2013-07-14 DIAGNOSIS — I4891 Unspecified atrial fibrillation: Secondary | ICD-10-CM

## 2013-08-10 ENCOUNTER — Other Ambulatory Visit: Payer: Self-pay | Admitting: Cardiovascular Disease

## 2013-08-25 ENCOUNTER — Ambulatory Visit (INDEPENDENT_AMBULATORY_CARE_PROVIDER_SITE_OTHER): Payer: BC Managed Care – PPO | Admitting: *Deleted

## 2013-08-25 DIAGNOSIS — I4891 Unspecified atrial fibrillation: Secondary | ICD-10-CM

## 2013-08-25 LAB — POCT INR: INR: 3

## 2013-09-08 ENCOUNTER — Encounter: Payer: Self-pay | Admitting: Nurse Practitioner

## 2013-09-08 ENCOUNTER — Ambulatory Visit (INDEPENDENT_AMBULATORY_CARE_PROVIDER_SITE_OTHER): Payer: BC Managed Care – PPO | Admitting: Nurse Practitioner

## 2013-09-08 VITALS — BP 130/80 | HR 60 | Ht 68.0 in | Wt 235.1 lb

## 2013-09-08 DIAGNOSIS — I4891 Unspecified atrial fibrillation: Secondary | ICD-10-CM

## 2013-09-08 DIAGNOSIS — I25810 Atherosclerosis of coronary artery bypass graft(s) without angina pectoris: Secondary | ICD-10-CM

## 2013-09-08 DIAGNOSIS — I5022 Chronic systolic (congestive) heart failure: Secondary | ICD-10-CM

## 2013-09-08 LAB — LIPID PANEL
Cholesterol: 110 mg/dL (ref 0–200)
HDL: 35.6 mg/dL — ABNORMAL LOW (ref >39.00)
LDL Cholesterol: 52 mg/dL (ref 0–99)
Total CHOL/HDL Ratio: 3
Triglycerides: 112 mg/dL (ref 0.0–149.0)
VLDL: 22.4 mg/dL (ref 0.0–40.0)

## 2013-09-08 LAB — HEPATIC FUNCTION PANEL
ALT: 20 U/L (ref 0–53)
AST: 26 U/L (ref 0–37)
Albumin: 3.7 g/dL (ref 3.5–5.2)
Alkaline Phosphatase: 44 U/L (ref 39–117)
Bilirubin, Direct: 0.1 mg/dL (ref 0.0–0.3)
Total Bilirubin: 0.5 mg/dL (ref 0.3–1.2)
Total Protein: 7.1 g/dL (ref 6.0–8.3)

## 2013-09-08 LAB — BASIC METABOLIC PANEL
BUN: 12 mg/dL (ref 6–23)
CO2: 25 mEq/L (ref 19–32)
Calcium: 8.8 mg/dL (ref 8.4–10.5)
Chloride: 101 mEq/L (ref 96–112)
Creatinine, Ser: 1.2 mg/dL (ref 0.4–1.5)
GFR: 66.51 mL/min (ref >60.00)
Glucose, Bld: 100 mg/dL — ABNORMAL HIGH (ref 70–99)
Potassium: 4.5 mEq/L (ref 3.5–5.1)
Sodium: 134 mEq/L — ABNORMAL LOW (ref 135–145)

## 2013-09-08 LAB — TSH: TSH: 0.76 u[IU]/mL (ref 0.35–5.50)

## 2013-09-08 NOTE — Progress Notes (Signed)
Alejandro [name] Date of Birth: 02/03/1946 Medical Record #[id number]  History of Present Illness: Alejandro [name] is seen back today for a 6 month check. Seen for Dr. Excell Seltzer. Has known atrial fib, chronic systolic heart failure and CAD with pat CABG. Long standing history of smoking and alcohol use. Last echo showed EF of 30 to 35% from March of 2014.   Last seen here back in April. Seemed to be doing ok. His echo had been updated - poor acoustic windows noted on the imaging - Dr. Excell Seltzer wanted to to consider cardiac MRI for better assessment of LV function. Currently without ICD in place.   Comes back today. Here alone. Says he is doing ok. Still smoking some. Has daily alcohol use - does not really quantify - drinks on a daily basis - not interested in stopping. Is fatigued. Weight is way up. Not short of breath. No chest pain. Not swelling. No PND/orthopnea. Rare dizzy spells. Coumadin levels have been ok. Very sedentary. No regular exercise.    Current Outpatient Prescriptions  Medication Sig Dispense Refill  . carvedilol (COREG) 12.5 MG tablet Take 25 mg by mouth 2 (two) times daily with a meal.      . fexofenadine (ALLEGRA) 30 MG tablet Take 30 mg by mouth as needed.      . furosemide (LASIX) 40 MG tablet Take 20 mg by mouth daily.       Marland Kitchen ibuprofen (ADVIL,MOTRIN) 200 MG tablet Take 400 mg by mouth every 6 (six) hours as needed for pain.      Marland Kitchen lisinopril (PRINIVIL,ZESTRIL) 20 MG tablet TAKE 1 TABLET EVERY DAY  90 tablet  1  . simvastatin (ZOCOR) 40 MG tablet Take 40 mg by mouth every evening.      . warfarin (COUMADIN) 2 MG tablet USE AS DIRECTED BY ANTICOAGUALTION CLINIC  120 tablet  0   No current facility-administered medications for this visit.    No Known Allergies  Past Medical History  Diagnosis Date  . Systolic heart failure   . 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 Dyslipidemia   . HTN (hypertension)   . Atrial fibrillation   . Cancer     around  nose  . Tobacco abuse   . Alcohol abuse     Past Surgical History  Procedure Laterality Date  . Coronary artery bypass graft  12/24/2008    4 vessel  . Umbilical hernia repair  2010  . Hernia repair  2012  . Coronary artery bypass graft  2012  . Ventral hernia repair N/A 01/22/2013    Procedure: HERNIA REPAIR VENTRAL ADULT;  Surgeon: Robyne Askew, MD;  Location: [facility] OR;  Service: General;  Laterality: N/A;  . Application of a-cell of chest/abdomen N/A 01/22/2013    Procedure: APPLICATION OF A-CELL OF CHEST/ABDOMEN;  Surgeon: Robyne Askew, MD;  Location: [facility] OR;  Service: General;  Laterality: N/A;  . Cystoscopy N/A 01/22/2013    Procedure: Bluford Kaufmann;  Surgeon: Garnett Farm, MD;  Location: [facility] OR;  Service: Urology;  Laterality: N/A;  Cystoscopy with balloon dilation. Insertion of coude catheter.    History  Smoking status  . Current Some Day Smoker -- 1.00 packs/day for 50 years  . Types: Cigarettes  . Last Attempt to Quit: 12/21/2012  Smokeless tobacco  . Never Used    History  Alcohol Use  . Yes    Comment: 4-5  12oz  beers daily    Family History  Problem Relation Age of Onset  . Diabetes Mother   . Heart disease Mother   . Diabetes Father   . Heart disease Father   . Heart disease Brother     s/p CABG at 40  . Colon cancer Neg Hx   . Prostate cancer Neg Hx     Review of Systems: The review of systems is per the HPI.  All other systems were reviewed and are negative.  Physical Exam: BP 130/80  Pulse 60  Ht 5\' 8"  (1.727 m)  Wt 235 lb 1.9 oz (106.65 kg)  BMI 35.76 kg/m2 Patient is very pleasant and in no acute distress. Skin is warm and dry. Color is normal.  HEENT is unremarkable. Normocephalic/atraumatic. PERRL. Sclera are nonicteric. Neck is supple. No masses. No JVD. Lungs are clear. Cardiac exam shows an irregular rhythm. Rate is ok. Abdomen is soft. Extremities are without edema. Gait and ROM are intact. No gross neurologic deficits noted.  LABORATORY  DATA:  EKG today with atrial fib with a controlled VR.  Labs are pending for today.    Lab Results  Component Value Date   WBC 9.1 02/10/2013   HGB 12.0* 02/10/2013   HCT 35.4* 02/10/2013   PLT 351.0 02/10/2013   GLUCOSE 88 02/10/2013   CHOL 86 01/29/2013   TRIG 116 01/29/2013   HDL 29.40* 12/31/2011   LDLCALC 37 12/31/2011   ALT 20 01/29/2013   AST 26 01/29/2013   NA 132* 02/10/2013   K 4.0 02/10/2013   CL 100 02/10/2013   CREATININE 1.4 02/10/2013   BUN 17 02/10/2013   CO2 24 02/10/2013   TSH 1.279 09/26/2012   INR 3.0 08/25/2013   HGBA1C  Value: 5.6 (NOTE)   The ADA recommends the following therapeutic goal for glycemic   control related to Hgb A1C measurement:   Goal of Therapy:   < 7.0% Hgb A1C   Reference: American Diabetes Association: Clinical Practice   Recommendations 2008, Diabetes Care,  2008, 31:(Suppl 1). 12/23/2008   Echo Study Conclusions from March 2014  - Left ventricle: The cavity size was mildly dilated. Wall thickness was increased in a pattern of mild LVH. Systolic function was moderately to severely reduced. The estimated ejection fraction was in the range of 30% to 35%. - Left atrium: The atrium was mildly dilated. - Right ventricle: The cavity size was mildly dilated. Systolic function was mildly reduced.   Assessment / Plan:  1. Chronic atrial fib - managed with rate control and anticoagulation - no change in therapy. Rate is ok. Coumadin has been ok. No adverse effects noted.   2. CAD - remote CABG - no chest pain reported.   3. Polysubstance abuse - he is not ready to stop. Cessation for both advised.   4. Chronic systolic HF - weight is up - not really symptomatic - will proceed with cardiac MRI to further define and then consider ICD implant.   Tentatively have him see Dr. Excell Seltzer in 6 months.  Patient is agreeable to this plan and will call if any problems develop in the interim.   Rosalio Macadamia, RN, ANP-C [facility] [address] [place], [place]  [address]

## 2013-09-08 NOTE — Patient Instructions (Addendum)
Stay on your current medicines  We will check labs today  We are going to arrange for a cardiac MRI - this will let us know what your pumping function is and we may need to consider a referral for an ICD (defibrillator)  Try to cut back on your alcohol and your tobacco use  See Dr. Excell Seltzer in 6 months  Call the [facility] office at [phone] if you have any questions, problems or concerns.

## 2013-09-18 ENCOUNTER — Telehealth: Payer: Self-pay | Admitting: Cardiovascular Disease

## 2013-09-18 NOTE — Telephone Encounter (Signed)
I spoke with the pt and made her aware that the pt has an irregular pulse due to atrial fibrillation.  The pt has been scheduled for Cardiac MRI on 10/21/13.  I made her aware of this test and pre-procedure instructions.

## 2013-09-18 NOTE — Telephone Encounter (Signed)
New Problem  Pt's Wife call stating that his PCP found an irregular heart beat during previous appt and is requesting an MRI w/ Cardiologist.. Wife states that she has not heard anything else about this and wants to know from Dr. Earmon Phoenix office if the MRI is needed based off of previous OV here on 09/08/13. Please advised.

## 2013-09-24 ENCOUNTER — Encounter: Payer: Self-pay | Admitting: Cardiovascular Disease

## 2013-10-06 ENCOUNTER — Ambulatory Visit (INDEPENDENT_AMBULATORY_CARE_PROVIDER_SITE_OTHER): Payer: BC Managed Care – PPO | Admitting: General Practice

## 2013-10-06 DIAGNOSIS — I4891 Unspecified atrial fibrillation: Secondary | ICD-10-CM

## 2013-10-06 LAB — POCT INR: INR: 2.4

## 2013-10-07 ENCOUNTER — Ambulatory Visit: Payer: BC Managed Care – PPO | Admitting: Cardiovascular Disease

## 2013-10-21 ENCOUNTER — Ambulatory Visit (HOSPITAL_COMMUNITY)
Admission: RE | Admit: 2013-10-21 | Discharge: 2013-10-21 | Disposition: A | Discharge status: Home / Self Care | Payer: BC Managed Care – PPO | Source: Ambulatory Visit | Attending: Nurse Practitioner | Admitting: Nurse Practitioner

## 2013-10-21 DIAGNOSIS — I2589 Other forms of chronic ischemic heart disease: Secondary | ICD-10-CM | POA: Insufficient documentation

## 2013-10-21 DIAGNOSIS — I4891 Unspecified atrial fibrillation: Secondary | ICD-10-CM

## 2013-10-21 DIAGNOSIS — I25810 Atherosclerosis of coronary artery bypass graft(s) without angina pectoris: Secondary | ICD-10-CM

## 2013-10-21 DIAGNOSIS — I5022 Chronic systolic (congestive) heart failure: Secondary | ICD-10-CM

## 2013-10-21 LAB — CREATININE, SERUM
Creatinine, Ser: 1.13 mg/dL (ref 0.50–1.35)
GFR calc Af Amer: 75 mL/min — ABNORMAL LOW (ref >90)
GFR calc non Af Amer: 65 mL/min — ABNORMAL LOW (ref >90)

## 2013-10-21 MED ORDER — GADOBENATE DIMEGLUMINE 529 MG/ML IV SOLN
35.0000 mL | Freq: Once | INTRAVENOUS | Status: AC | PRN
  Administered 2013-10-21: 34 mL via INTRAVENOUS

## 2013-10-22 ENCOUNTER — Other Ambulatory Visit: Payer: Self-pay | Admitting: Cardiovascular Disease

## 2013-11-17 ENCOUNTER — Ambulatory Visit (INDEPENDENT_AMBULATORY_CARE_PROVIDER_SITE_OTHER): Payer: BC Managed Care – PPO | Admitting: *Deleted

## 2013-11-17 DIAGNOSIS — I4891 Unspecified atrial fibrillation: Secondary | ICD-10-CM

## 2013-11-17 LAB — POCT INR: INR: 2

## 2013-11-23 ENCOUNTER — Other Ambulatory Visit: Payer: Self-pay | Admitting: Cardiovascular Disease

## 2013-12-01 ENCOUNTER — Other Ambulatory Visit: Payer: Self-pay | Admitting: Cardiovascular Disease

## 2013-12-10 ENCOUNTER — Other Ambulatory Visit: Payer: Self-pay | Admitting: Cardiovascular Disease

## 2013-12-14 ENCOUNTER — Telehealth: Payer: Self-pay | Admitting: *Deleted

## 2013-12-14 NOTE — Telephone Encounter (Signed)
In reviewing the pt's chart the documentation shows the pt is taking lasix 40mg  one-half tablet daily.  I am not sure if the 80mg  tablet is being requested to save money or not. Per Mindy the pt's wife states that the pt is taking lasix 80mg  take one-half tablet daily. At this time we should update the pt's medication list show what he is currently taking per his wife and authorize Rx for 80mg  one-half daily.

## 2013-12-14 NOTE — Telephone Encounter (Signed)
I spoke to patients wife regarding his lasix dose. The pharmacy is requesting 80mg  qd, but the office visit notes say 40mg  take 0.5 daily. How should this be filled? Thanks, MI

## 2013-12-29 ENCOUNTER — Ambulatory Visit (INDEPENDENT_AMBULATORY_CARE_PROVIDER_SITE_OTHER): Payer: BC Managed Care – PPO | Admitting: Pharmacist

## 2013-12-29 DIAGNOSIS — I4891 Unspecified atrial fibrillation: Secondary | ICD-10-CM

## 2013-12-29 LAB — POCT INR: INR: 2.9

## 2014-02-09 ENCOUNTER — Ambulatory Visit (INDEPENDENT_AMBULATORY_CARE_PROVIDER_SITE_OTHER): Payer: BC Managed Care – PPO

## 2014-02-09 DIAGNOSIS — Z5181 Encounter for therapeutic drug level monitoring: Secondary | ICD-10-CM | POA: Insufficient documentation

## 2014-02-09 DIAGNOSIS — I4891 Unspecified atrial fibrillation: Secondary | ICD-10-CM

## 2014-02-09 LAB — POCT INR: INR: 3

## 2014-02-15 ENCOUNTER — Other Ambulatory Visit: Payer: Self-pay | Admitting: Cardiovascular Disease

## 2014-03-01 ENCOUNTER — Other Ambulatory Visit: Payer: Self-pay

## 2014-03-01 ENCOUNTER — Other Ambulatory Visit: Payer: Self-pay | Admitting: Cardiovascular Disease

## 2014-03-01 MED ORDER — CARVEDILOL 25 MG PO TABS: 25.0000 mg | ORAL_TABLET | Freq: Two times a day (BID) | ORAL | Status: DC

## 2014-03-01 NOTE — Telephone Encounter (Signed)
Confirmed with patients wife from bottle that the carvedilol dose states 12.5 mg take 2 in am and 2 in pm which equals the 25 mg in am and 25 mg in pm that he has been on for a long time, bottle is empty but probably 1/2 tablets in bottle,  I refilled  Medication, they have appointment with Dr [name] in April and he can be re-evaluated if changes are needed.

## 2014-03-14 ENCOUNTER — Other Ambulatory Visit: Payer: Self-pay | Admitting: Cardiovascular Disease

## 2014-03-15 ENCOUNTER — Ambulatory Visit (INDEPENDENT_AMBULATORY_CARE_PROVIDER_SITE_OTHER): Payer: BC Managed Care – PPO | Admitting: *Deleted

## 2014-03-15 ENCOUNTER — Encounter: Payer: Self-pay | Admitting: Cardiovascular Disease

## 2014-03-15 ENCOUNTER — Ambulatory Visit (INDEPENDENT_AMBULATORY_CARE_PROVIDER_SITE_OTHER): Payer: BC Managed Care – PPO | Admitting: Cardiovascular Disease

## 2014-03-15 VITALS — BP 122/70 | HR 62 | Ht 68.0 in | Wt 239.0 lb

## 2014-03-15 DIAGNOSIS — I4891 Unspecified atrial fibrillation: Secondary | ICD-10-CM

## 2014-03-15 DIAGNOSIS — I25810 Atherosclerosis of coronary artery bypass graft(s) without angina pectoris: Secondary | ICD-10-CM

## 2014-03-15 DIAGNOSIS — Z5181 Encounter for therapeutic drug level monitoring: Secondary | ICD-10-CM

## 2014-03-15 DIAGNOSIS — E785 Hyperlipidemia, unspecified: Secondary | ICD-10-CM

## 2014-03-15 DIAGNOSIS — I5022 Chronic systolic (congestive) heart failure: Secondary | ICD-10-CM

## 2014-03-15 LAB — POCT INR: INR: 2.4

## 2014-03-15 MED ORDER — WARFARIN SODIUM 2 MG PO TABS
ORAL_TABLET | ORAL | Status: DC
Start: 2014-03-15 — End: 2014-04-19

## 2014-03-15 NOTE — Patient Instructions (Addendum)
Your physician wants you to follow-up in: 6 MONTHS with Dr [name].  You will receive a reminder letter in the mail two months in advance. If you don't receive a letter, please call our office to schedule the follow-up appointment.  Your physician recommends that you return for a FASTING BMP, LIPID and LIVER profile in 6 MONTHS--nothing to eat or drink after midnight, lab opens at 7:30 am  Your physician recommends that you continue on your current medications as directed. Please refer to the Current Medication list given to you today.

## 2014-03-15 NOTE — Progress Notes (Signed)
HPI:  68 year old gentleman presenting for followup evaluation. The patient is followed for chronic systolic heart failure with underlying ischemic and nonischemic cardiomyopathy. His LVEF has been severely depressed in the past, but on a recent cardiac MRI LV function has improved significantly. Results of his MRI are below. He also has permanent atrial fibrillation, alcohol and tobacco use, and multivessel CAD status post CABG.  Last lipid panel 09/08/2013: Lipid Panel     Component Value Date/Time   CHOL 110 09/08/2013 0859   TRIG 112.0 09/08/2013 0859   HDL 35.60* 09/08/2013 0859   CHOLHDL 3 09/08/2013 0859   VLDL 22.4 09/08/2013 0859   LDLCALC 52 09/08/2013 0859   The patient is doing well from a symptomatic perspective. He is still working and plans on retiring about this time next year. He denies chest pain, shortness of breath, palpitations, or leg swelling. He continues to smoke cigarettes and drink alcohol with no real intention for quitting. He is compliant with his medications.   Outpatient Encounter Prescriptions as of 03/15/2014  Medication Sig  . carvedilol (COREG) 25 MG tablet Take 1 tablet (25 mg total) by mouth 2 (two) times daily with a meal.  . fexofenadine (ALLEGRA) 30 MG tablet Take 180 mg by mouth as needed.   . furosemide (LASIX) 80 MG tablet Take 0.5 tablets (40 mg total) by mouth daily.  Marland Kitchen ibuprofen (ADVIL,MOTRIN) 200 MG tablet Take 400 mg by mouth every 6 (six) hours as needed for pain.  Marland Kitchen lisinopril (PRINIVIL,ZESTRIL) 20 MG tablet TAKE 1 TABLET EVERY DAY  . simvastatin (ZOCOR) 40 MG tablet Take 40 mg by mouth every evening.  . warfarin (COUMADIN) 2 MG tablet Take as directed by coumadin clinic  . [DISCONTINUED] simvastatin (ZOCOR) 40 MG tablet TAKE ONE TABLET BY MOUTH DAILY AT BEDTIME  . [DISCONTINUED] warfarin (COUMADIN) 2 MG tablet USE AS DIRECTED BY ANTICOAGUALTION CLINIC    No Known Allergies  Past Medical History  Diagnosis Date  . Systolic heart  failure   . 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 Dyslipidemia   . HTN (hypertension)   . Atrial fibrillation   . Cancer     around nose  . Tobacco abuse   . Alcohol abuse     ROS: Negative except as per HPI  BP 122/70  Pulse 62  Ht 5\' 8"  (1.727 m)  Wt 239 lb (108.41 kg)  BMI 36.35 kg/m2  PHYSICAL EXAM: Pt is alert and oriented, pleasant obese male in NAD HEENT: normal Neck: JVP - normal, carotids 2+= without bruits Lungs: CTA bilaterally CV: RRR without murmur or gallop Abd: soft, NT, Positive BS, obese Ext: no C/C/E, distal pulses intact and equal Skin: warm/dry no rash  EKG:  Atrial fibrillation 62 beats per minute, left axis deviation, incomplete left bundle branch block.  Cardiac MRI: FINDINGS: There was mild to moderate LAE. The LV was normal in size and function. There was mild LVH. There were no discrete RWMA;s. The RA and RV were normal in size and function. There was no ASD VSD or pericardial effusion. There was a prominent epicardial fat pad. The mitral valve displayed mild MR Aortic valve appeared normal. Aortic root was normal and measures 3.3 cm compared to the descending thoracic aorta that measures 2.0 cm. The quantitative EF using basal short axis Simpson's rule was 52% (EDV 116cc ESV 55cc and SV 61 cc) Delayed enhancement images showed no scar or infarct  IMPRESSION: 1)  Normal LV size and function EF 52%  2) No hyperenhancement or infarct in LV myocardium  3) Mild to moderate LAE  4) Normal RV/RA  5) Mild MR  6) Prominent epicardial fat pad  Jenkins Rouge  ASSESSMENT AND PLAN: 1. Coronary artery disease, native vessel. The patient is stable without symptoms of angina. He continues on a beta blocker and statin drug. No antiplatelet therapy as prescribed because of warfarin.  2. Permanent atrial fibrillation. Continue warfarin. Heart rate is well controlled.  3. Chronic mixed systolic and diastolic heart failure. LV  function was improved by cardiac MRI. He will continue on a goal doses of carvedilol and lisinopril.  4. Hyperlipidemia: Continue simvastatin. Lipids reviewed as above. We'll repeat prior to his next office visit in 6 months. As always, we discussed the importance of lifestyle modification with dietary changes, exercise, tobacco cessation, and reduction in alcohol intake.  [name] 03/15/2014 3:43 PM

## 2014-04-02 DIAGNOSIS — I639 Cerebral infarction, unspecified: Secondary | ICD-10-CM

## 2014-04-02 HISTORY — DX: Cerebral infarction, unspecified: I63.9

## 2014-04-10 ENCOUNTER — Other Ambulatory Visit: Payer: Self-pay | Admitting: Cardiovascular Disease

## 2014-04-19 ENCOUNTER — Inpatient Hospital Stay (HOSPITAL_COMMUNITY): Payer: BC Managed Care – PPO

## 2014-04-19 ENCOUNTER — Encounter (HOSPITAL_COMMUNITY): Payer: BC Managed Care – PPO | Admitting: Anesthesiology

## 2014-04-19 ENCOUNTER — Inpatient Hospital Stay (HOSPITAL_COMMUNITY): Payer: BC Managed Care – PPO | Admitting: Anesthesiology

## 2014-04-19 ENCOUNTER — Emergency Department (HOSPITAL_COMMUNITY): Payer: BC Managed Care – PPO

## 2014-04-19 ENCOUNTER — Encounter (HOSPITAL_COMMUNITY)
Admission: EM | Disposition: A | Discharge status: Rehabilitation Facility | Payer: Self-pay | Source: Home / Self Care | Attending: Pulmonary Disease

## 2014-04-19 ENCOUNTER — Inpatient Hospital Stay (HOSPITAL_COMMUNITY)
Admission: EM | Admit: 2014-04-19 | Discharge: 2014-05-04 | DRG: 026 | Disposition: A | Discharge status: Rehabilitation Facility | Payer: BC Managed Care – PPO | Attending: Internal Medicine | Admitting: Internal Medicine

## 2014-04-19 ENCOUNTER — Encounter (HOSPITAL_COMMUNITY): Payer: Self-pay | Admitting: Emergency Medicine

## 2014-04-19 DIAGNOSIS — S065XAA Traumatic subdural hemorrhage with loss of consciousness status unknown, initial encounter: Secondary | ICD-10-CM

## 2014-04-19 DIAGNOSIS — N390 Urinary tract infection, site not specified: Secondary | ICD-10-CM

## 2014-04-19 DIAGNOSIS — Z8249 Family history of ischemic heart disease and other diseases of the circulatory system: Secondary | ICD-10-CM

## 2014-04-19 DIAGNOSIS — W010XXA Fall on same level from slipping, tripping and stumbling without subsequent striking against object, initial encounter: Secondary | ICD-10-CM | POA: Diagnosis present

## 2014-04-19 DIAGNOSIS — I10 Essential (primary) hypertension: Secondary | ICD-10-CM

## 2014-04-19 DIAGNOSIS — R000 Tachycardia, unspecified: Secondary | ICD-10-CM | POA: Diagnosis not present

## 2014-04-19 DIAGNOSIS — I25810 Atherosclerosis of coronary artery bypass graft(s) without angina pectoris: Secondary | ICD-10-CM

## 2014-04-19 DIAGNOSIS — F17200 Nicotine dependence, unspecified, uncomplicated: Secondary | ICD-10-CM | POA: Diagnosis present

## 2014-04-19 DIAGNOSIS — K432 Incisional hernia without obstruction or gangrene: Secondary | ICD-10-CM

## 2014-04-19 DIAGNOSIS — E876 Hypokalemia: Secondary | ICD-10-CM | POA: Diagnosis not present

## 2014-04-19 DIAGNOSIS — R410 Disorientation, unspecified: Secondary | ICD-10-CM

## 2014-04-19 DIAGNOSIS — F10939 Alcohol use, unspecified with withdrawal, unspecified: Secondary | ICD-10-CM

## 2014-04-19 DIAGNOSIS — R404 Transient alteration of awareness: Secondary | ICD-10-CM | POA: Diagnosis not present

## 2014-04-19 DIAGNOSIS — S065X9A Traumatic subdural hemorrhage with loss of consciousness of unspecified duration, initial encounter: Principal | ICD-10-CM | POA: Diagnosis present

## 2014-04-19 DIAGNOSIS — IMO0002 Reserved for concepts with insufficient information to code with codable children: Secondary | ICD-10-CM | POA: Diagnosis present

## 2014-04-19 DIAGNOSIS — B965 Pseudomonas (aeruginosa) (mallei) (pseudomallei) as the cause of diseases classified elsewhere: Secondary | ICD-10-CM | POA: Diagnosis not present

## 2014-04-19 DIAGNOSIS — Z5181 Encounter for therapeutic drug level monitoring: Secondary | ICD-10-CM

## 2014-04-19 DIAGNOSIS — I5023 Acute on chronic systolic (congestive) heart failure: Secondary | ICD-10-CM

## 2014-04-19 DIAGNOSIS — I6200 Nontraumatic subdural hemorrhage, unspecified: Secondary | ICD-10-CM

## 2014-04-19 DIAGNOSIS — R1310 Dysphagia, unspecified: Secondary | ICD-10-CM | POA: Diagnosis not present

## 2014-04-19 DIAGNOSIS — I5022 Chronic systolic (congestive) heart failure: Secondary | ICD-10-CM

## 2014-04-19 DIAGNOSIS — I509 Heart failure, unspecified: Secondary | ICD-10-CM | POA: Diagnosis present

## 2014-04-19 DIAGNOSIS — J9600 Acute respiratory failure, unspecified whether with hypoxia or hypercapnia: Secondary | ICD-10-CM

## 2014-04-19 DIAGNOSIS — R7309 Other abnormal glucose: Secondary | ICD-10-CM | POA: Diagnosis not present

## 2014-04-19 DIAGNOSIS — Z7901 Long term (current) use of anticoagulants: Secondary | ICD-10-CM

## 2014-04-19 DIAGNOSIS — R4182 Altered mental status, unspecified: Secondary | ICD-10-CM

## 2014-04-19 DIAGNOSIS — I4891 Unspecified atrial fibrillation: Secondary | ICD-10-CM

## 2014-04-19 DIAGNOSIS — J9811 Atelectasis: Secondary | ICD-10-CM

## 2014-04-19 DIAGNOSIS — Z951 Presence of aortocoronary bypass graft: Secondary | ICD-10-CM

## 2014-04-19 DIAGNOSIS — F10239 Alcohol dependence with withdrawal, unspecified: Secondary | ICD-10-CM

## 2014-04-19 DIAGNOSIS — Z833 Family history of diabetes mellitus: Secondary | ICD-10-CM

## 2014-04-19 DIAGNOSIS — E785 Hyperlipidemia, unspecified: Secondary | ICD-10-CM

## 2014-04-19 DIAGNOSIS — I2510 Atherosclerotic heart disease of native coronary artery without angina pectoris: Secondary | ICD-10-CM | POA: Diagnosis present

## 2014-04-19 DIAGNOSIS — L989 Disorder of the skin and subcutaneous tissue, unspecified: Secondary | ICD-10-CM

## 2014-04-19 DIAGNOSIS — F1020 Alcohol dependence, uncomplicated: Secondary | ICD-10-CM | POA: Diagnosis present

## 2014-04-19 DIAGNOSIS — F1010 Alcohol abuse, uncomplicated: Secondary | ICD-10-CM

## 2014-04-19 DIAGNOSIS — Z8679 Personal history of other diseases of the circulatory system: Secondary | ICD-10-CM | POA: Diagnosis present

## 2014-04-19 HISTORY — DX: Personal history of other diseases of the digestive system: Z87.19

## 2014-04-19 HISTORY — PX: CRANIOTOMY: SHX93

## 2014-04-19 LAB — CBC WITH DIFFERENTIAL/PLATELET
BASOS ABS: 0 10*3/uL (ref 0.0–0.1)
Basophils Relative: 0 % (ref 0–1)
Eosinophils Absolute: 0.2 10*3/uL (ref 0.0–0.7)
Eosinophils Relative: 1 % (ref 0–5)
HCT: 40 % (ref 39.0–52.0)
HEMOGLOBIN: 14.2 g/dL (ref 13.0–17.0)
Lymphocytes Relative: 8 % — ABNORMAL LOW (ref 12–46)
Lymphs Abs: 1.1 10*3/uL (ref 0.7–4.0)
MCH: 30.5 pg (ref 26.0–34.0)
MCHC: 35.5 g/dL (ref 30.0–36.0)
MCV: 86 fL (ref 78.0–100.0)
MONOS PCT: 5 % (ref 3–12)
Monocytes Absolute: 0.6 10*3/uL (ref 0.1–1.0)
Neutro Abs: 12.2 10*3/uL — ABNORMAL HIGH (ref 1.7–7.7)
Neutrophils Relative %: 86 % — ABNORMAL HIGH (ref 43–77)
Platelets: 237 10*3/uL (ref 150–400)
RBC: 4.65 MIL/uL (ref 4.22–5.81)
RDW: 13.3 % (ref 11.5–15.5)
WBC: 14.1 10*3/uL — ABNORMAL HIGH (ref 4.0–10.5)

## 2014-04-19 LAB — COMPREHENSIVE METABOLIC PANEL
ALBUMIN: 4.2 g/dL (ref 3.5–5.2)
ALK PHOS: 64 U/L (ref 39–117)
ALT: 18 U/L (ref 0–53)
AST: 23 U/L (ref 0–37)
BILIRUBIN TOTAL: 0.7 mg/dL (ref 0.3–1.2)
BUN: 10 mg/dL (ref 6–23)
CHLORIDE: 94 meq/L — AB (ref 96–112)
CO2: 22 mEq/L (ref 19–32)
Calcium: 9.4 mg/dL (ref 8.4–10.5)
Creatinine, Ser: 1.09 mg/dL (ref 0.50–1.35)
GFR calc Af Amer: 79 mL/min — ABNORMAL LOW (ref >90)
GFR calc non Af Amer: 68 mL/min — ABNORMAL LOW (ref >90)
Glucose, Bld: 134 mg/dL — ABNORMAL HIGH (ref 70–99)
Potassium: 4.1 mEq/L (ref 3.7–5.3)
SODIUM: 132 meq/L — AB (ref 137–147)
Total Protein: 8.1 g/dL (ref 6.0–8.3)

## 2014-04-19 LAB — TROPONIN I: Troponin I: <0.3 ng/mL (ref <0.30)

## 2014-04-19 LAB — PROTIME-INR
INR: 2.49 — ABNORMAL HIGH (ref 0.00–1.49)
INR: 1.13 (ref 0.00–1.49)
Prothrombin Time: 26.1 seconds — ABNORMAL HIGH (ref 11.6–15.2)
Prothrombin Time: 14.3 seconds (ref 11.6–15.2)

## 2014-04-19 LAB — PREPARE RBC (CROSSMATCH)

## 2014-04-19 LAB — MRSA PCR SCREENING: MRSA BY PCR: NEGATIVE

## 2014-04-19 SURGERY — CRANIOTOMY HEMATOMA EVACUATION SUBDURAL
Anesthesia: General | Site: Head | Laterality: Right

## 2014-04-19 MED ORDER — ARTIFICIAL TEARS OP OINT
TOPICAL_OINTMENT | OPHTHALMIC | Status: AC
  Filled 2014-04-19: qty 3.5

## 2014-04-19 MED ORDER — PROMETHAZINE HCL 25 MG PO TABS: 12.5000 mg | ORAL_TABLET | ORAL | Status: DC | PRN

## 2014-04-19 MED ORDER — NICARDIPINE HCL IN NACL 20-0.86 MG/200ML-% IV SOLN
3.0000 mg/h | INTRAVENOUS | Status: DC
Start: 2014-04-19 — End: 2014-04-20
  Administered 2014-04-19: 9 mg/h via INTRAVENOUS
  Administered 2014-04-19: 5 mg/h via INTRAVENOUS
  Administered 2014-04-20: 7 mg/h via INTRAVENOUS
  Administered 2014-04-20 (×2): 9.5 mg/h via INTRAVENOUS
  Filled 2014-04-19 (×6): qty 200

## 2014-04-19 MED ORDER — LORAZEPAM 1 MG PO TABS: 1.0000 mg | ORAL_TABLET | Freq: Four times a day (QID) | ORAL | Status: DC | PRN

## 2014-04-19 MED ORDER — FENTANYL CITRATE 0.05 MG/ML IJ SOLN
INTRAMUSCULAR | Status: AC
  Filled 2014-04-19: qty 5

## 2014-04-19 MED ORDER — PROTHROMBIN COMPLEX CONC HUMAN 500 UNITS IV KIT
2086.0000 [IU] | PACK | Status: AC
  Administered 2014-04-19: 2086 [IU] via INTRAVENOUS
  Filled 2014-04-19: qty 83

## 2014-04-19 MED ORDER — DEXAMETHASONE SODIUM PHOSPHATE 4 MG/ML IJ SOLN
INTRAMUSCULAR | Status: AC
  Filled 2014-04-19: qty 1

## 2014-04-19 MED ORDER — PROPOFOL 10 MG/ML IV BOLUS
INTRAVENOUS | Status: AC
  Filled 2014-04-19: qty 20

## 2014-04-19 MED ORDER — FENTANYL CITRATE 0.05 MG/ML IJ SOLN
INTRAMUSCULAR | Status: AC
  Filled 2014-04-19: qty 2

## 2014-04-19 MED ORDER — ONDANSETRON HCL 4 MG/2ML IJ SOLN
4.0000 mg | INTRAMUSCULAR | Status: DC | PRN
  Administered 2014-04-19 – 2014-04-28 (×2): 4 mg via INTRAVENOUS
  Filled 2014-04-19 (×2): qty 2

## 2014-04-19 MED ORDER — SODIUM CHLORIDE 0.9 % IV SOLN
INTRAVENOUS | Status: DC
  Administered 2014-04-19: 16:00:00 via INTRAVENOUS
  Administered 2014-04-21: 75 mL/h via INTRAVENOUS
  Administered 2014-04-22: 16:00:00 via INTRAVENOUS
  Administered 2014-04-22: 1 mL via INTRAVENOUS
  Administered 2014-04-23: 20:00:00 via INTRAVENOUS
  Administered 2014-04-23: 1 mL via INTRAVENOUS
  Administered 2014-04-24 – 2014-04-30 (×2): via INTRAVENOUS

## 2014-04-19 MED ORDER — SODIUM CHLORIDE 0.9 % IV SOLN
250.0000 mL | INTRAVENOUS | Status: DC | PRN
  Administered 2014-04-23: 15:00:00 via INTRAVENOUS

## 2014-04-19 MED ORDER — ONDANSETRON HCL 4 MG/2ML IJ SOLN
4.0000 mg | Freq: Once | INTRAMUSCULAR | Status: AC
  Administered 2014-04-19: 4 mg via INTRAVENOUS
  Filled 2014-04-19: qty 2

## 2014-04-19 MED ORDER — ROCURONIUM BROMIDE 100 MG/10ML IV SOLN
INTRAVENOUS | Status: DC | PRN
  Administered 2014-04-19: 70 mg via INTRAVENOUS

## 2014-04-19 MED ORDER — LIDOCAINE HCL (CARDIAC) 20 MG/ML IV SOLN
INTRAVENOUS | Status: AC
  Filled 2014-04-19: qty 5

## 2014-04-19 MED ORDER — ONDANSETRON HCL 4 MG PO TABS: 4.0000 mg | ORAL_TABLET | ORAL | Status: DC | PRN

## 2014-04-19 MED ORDER — CEFAZOLIN SODIUM-DEXTROSE 2-3 GM-% IV SOLR
INTRAVENOUS | Status: DC | PRN
  Administered 2014-04-19: 2 g via INTRAVENOUS

## 2014-04-19 MED ORDER — MICROFIBRILLAR COLL HEMOSTAT EX PADS
MEDICATED_PAD | CUTANEOUS | Status: DC | PRN
  Administered 2014-04-19: 1 via TOPICAL

## 2014-04-19 MED ORDER — FOLIC ACID 1 MG PO TABS
1.0000 mg | ORAL_TABLET | Freq: Every day | ORAL | Status: DC
  Administered 2014-04-20: 1 mg via ORAL
  Filled 2014-04-19 (×2): qty 1

## 2014-04-19 MED ORDER — FENTANYL CITRATE 0.05 MG/ML IJ SOLN
25.0000 ug | INTRAMUSCULAR | Status: DC | PRN
  Administered 2014-04-19 (×2): 50 ug via INTRAVENOUS
  Administered 2014-04-20 (×5): 100 ug via INTRAVENOUS
  Filled 2014-04-19 (×7): qty 2

## 2014-04-19 MED ORDER — THROMBIN 20000 UNITS EX SOLR
CUTANEOUS | Status: DC | PRN
  Administered 2014-04-19: 19:00:00 via TOPICAL

## 2014-04-19 MED ORDER — ROCURONIUM BROMIDE 50 MG/5ML IV SOLN
INTRAVENOUS | Status: AC
  Filled 2014-04-19: qty 2

## 2014-04-19 MED ORDER — LIDOCAINE-EPINEPHRINE 1 %-1:100000 IJ SOLN
INTRAMUSCULAR | Status: DC | PRN
  Administered 2014-04-19: 10 mL

## 2014-04-19 MED ORDER — ADULT MULTIVITAMIN W/MINERALS CH
1.0000 | ORAL_TABLET | Freq: Every day | ORAL | Status: DC
  Administered 2014-04-20: 1 via ORAL
  Filled 2014-04-19 (×2): qty 1

## 2014-04-19 MED ORDER — LIDOCAINE HCL (CARDIAC) 20 MG/ML IV SOLN
INTRAVENOUS | Status: DC | PRN
Start: 2014-04-19 — End: 2014-04-19
  Administered 2014-04-19: 70 mg via INTRAVENOUS

## 2014-04-19 MED ORDER — THIAMINE HCL 100 MG/ML IJ SOLN
100.0000 mg | Freq: Every day | INTRAMUSCULAR | Status: DC
  Filled 2014-04-19 (×2): qty 1

## 2014-04-19 MED ORDER — LABETALOL HCL 5 MG/ML IV SOLN
10.0000 mg | INTRAVENOUS | Status: DC | PRN
  Administered 2014-04-23: 20 mg via INTRAVENOUS
  Administered 2014-04-23: 10 mg via INTRAVENOUS
  Administered 2014-04-23 (×4): 20 mg via INTRAVENOUS
  Administered 2014-04-23: 40 mg via INTRAVENOUS
  Administered 2014-04-23 – 2014-04-24 (×2): 20 mg via INTRAVENOUS
  Filled 2014-04-19: qty 4
  Filled 2014-04-19: qty 8
  Filled 2014-04-19 (×2): qty 4
  Filled 2014-04-19 (×2): qty 8
  Filled 2014-04-19 (×2): qty 4

## 2014-04-19 MED ORDER — SUCCINYLCHOLINE CHLORIDE 20 MG/ML IJ SOLN
INTRAMUSCULAR | Status: AC
  Filled 2014-04-19: qty 1

## 2014-04-19 MED ORDER — ONDANSETRON HCL 4 MG/2ML IJ SOLN
INTRAMUSCULAR | Status: AC
  Filled 2014-04-19: qty 2

## 2014-04-19 MED ORDER — THIAMINE HCL 100 MG/ML IJ SOLN
Freq: Once | INTRAVENOUS | Status: AC
  Administered 2014-04-19: 23:00:00 via INTRAVENOUS
  Filled 2014-04-19: qty 1000

## 2014-04-19 MED ORDER — CEFAZOLIN SODIUM 1-5 GM-% IV SOLN
1.0000 g | Freq: Three times a day (TID) | INTRAVENOUS | Status: AC
  Administered 2014-04-20 (×2): 1 g via INTRAVENOUS
  Filled 2014-04-19 (×2): qty 50

## 2014-04-19 MED ORDER — HEMOSTATIC AGENTS (NO CHARGE) OPTIME
TOPICAL | Status: DC | PRN
  Administered 2014-04-19: 1 via TOPICAL

## 2014-04-19 MED ORDER — FENTANYL CITRATE 0.05 MG/ML IJ SOLN
INTRAMUSCULAR | Status: DC | PRN
  Administered 2014-04-19: 100 ug via INTRAVENOUS
  Administered 2014-04-19: 150 ug via INTRAVENOUS

## 2014-04-19 MED ORDER — DEXAMETHASONE SODIUM PHOSPHATE 4 MG/ML IJ SOLN
INTRAMUSCULAR | Status: DC | PRN
  Administered 2014-04-19: 4 mg via INTRAVENOUS

## 2014-04-19 MED ORDER — PROPOFOL 10 MG/ML IV BOLUS
INTRAVENOUS | Status: DC | PRN
  Administered 2014-04-19: 50 mg via INTRAVENOUS

## 2014-04-19 MED ORDER — SODIUM CHLORIDE 0.9 % IR SOLN
Status: DC | PRN
  Administered 2014-04-19: 19:00:00

## 2014-04-19 MED ORDER — ARTIFICIAL TEARS OP OINT
TOPICAL_OINTMENT | OPHTHALMIC | Status: DC | PRN
  Administered 2014-04-19: 1 via OPHTHALMIC

## 2014-04-19 MED ORDER — ONDANSETRON HCL 4 MG/2ML IJ SOLN
INTRAMUSCULAR | Status: DC | PRN
  Administered 2014-04-19: 4 mg via INTRAVENOUS

## 2014-04-19 MED ORDER — FENTANYL CITRATE 0.05 MG/ML IJ SOLN
25.0000 ug | INTRAMUSCULAR | Status: DC | PRN
  Administered 2014-04-19 (×3): 50 ug via INTRAVENOUS

## 2014-04-19 MED ORDER — PNEUMOCOCCAL VAC POLYVALENT 25 MCG/0.5ML IJ INJ
0.5000 mL | INJECTION | INTRAMUSCULAR | Status: AC
  Administered 2014-04-20: 0.5 mL via INTRAMUSCULAR
  Filled 2014-04-19: qty 0.5

## 2014-04-19 MED ORDER — GLYCOPYRROLATE 0.2 MG/ML IJ SOLN
INTRAMUSCULAR | Status: AC
  Filled 2014-04-19: qty 3

## 2014-04-19 MED ORDER — VITAMIN B-1 100 MG PO TABS
100.0000 mg | ORAL_TABLET | Freq: Every day | ORAL | Status: DC
  Administered 2014-04-20: 100 mg via ORAL
  Filled 2014-04-19 (×2): qty 1

## 2014-04-19 MED ORDER — GLYCOPYRROLATE 0.2 MG/ML IJ SOLN
INTRAMUSCULAR | Status: DC | PRN
  Administered 2014-04-19: 0.6 mg via INTRAVENOUS

## 2014-04-19 MED ORDER — SODIUM CHLORIDE 0.9 % IV SOLN
INTRAVENOUS | Status: DC | PRN
  Administered 2014-04-19: 17:00:00 via INTRAVENOUS

## 2014-04-19 MED ORDER — 0.9 % SODIUM CHLORIDE (POUR BTL) OPTIME
TOPICAL | Status: DC | PRN
  Administered 2014-04-19 (×3): 1000 mL

## 2014-04-19 MED ORDER — NEOSTIGMINE METHYLSULFATE 10 MG/10ML IV SOLN
INTRAVENOUS | Status: DC | PRN
  Administered 2014-04-19: 5 mg via INTRAVENOUS

## 2014-04-19 MED ORDER — PANTOPRAZOLE SODIUM 40 MG IV SOLR
40.0000 mg | Freq: Every day | INTRAVENOUS | Status: DC
  Administered 2014-04-19 – 2014-04-20 (×2): 40 mg via INTRAVENOUS
  Filled 2014-04-19 (×3): qty 40

## 2014-04-19 MED ORDER — ETOMIDATE 2 MG/ML IV SOLN
INTRAVENOUS | Status: DC | PRN
  Administered 2014-04-19 (×2): 14 mg via INTRAVENOUS

## 2014-04-19 MED ORDER — LABETALOL HCL 5 MG/ML IV SOLN
INTRAVENOUS | Status: DC | PRN
  Administered 2014-04-19: 5 mg via INTRAVENOUS
  Administered 2014-04-19: 10 mg via INTRAVENOUS
  Administered 2014-04-19: 5 mg via INTRAVENOUS
  Administered 2014-04-19 (×2): 10 mg via INTRAVENOUS

## 2014-04-19 MED ORDER — SODIUM CHLORIDE 0.9 % IV SOLN
INTRAVENOUS | Status: DC | PRN
  Administered 2014-04-19 (×2): via INTRAVENOUS

## 2014-04-19 MED ORDER — NEOSTIGMINE METHYLSULFATE 10 MG/10ML IV SOLN
INTRAVENOUS | Status: AC
  Filled 2014-04-19: qty 1

## 2014-04-19 MED ORDER — VITAMIN K1 10 MG/ML IJ SOLN
10.0000 mg | INTRAVENOUS | Status: AC
  Administered 2014-04-19: 10 mg via INTRAVENOUS
  Filled 2014-04-19: qty 1

## 2014-04-19 MED ORDER — LORAZEPAM 2 MG/ML IJ SOLN: 1.0000 mg | Freq: Four times a day (QID) | INTRAMUSCULAR | Status: DC | PRN

## 2014-04-19 MED ORDER — SODIUM CHLORIDE 0.9 % IV SOLN
500.0000 mg | Freq: Two times a day (BID) | INTRAVENOUS | Status: DC
  Administered 2014-04-19 – 2014-04-23 (×8): 500 mg via INTRAVENOUS
  Filled 2014-04-19 (×9): qty 5

## 2014-04-19 SURGICAL SUPPLY — 80 items
BAG DECANTER FOR FLEXI CONT (MISCELLANEOUS) ×3 IMPLANT
BANDAGE GAUZE 4  KLING STR (GAUZE/BANDAGES/DRESSINGS) ×3 IMPLANT
BANDAGE GAUZE ELAST BULKY 4 IN (GAUZE/BANDAGES/DRESSINGS) ×3 IMPLANT
BIT DRILL WIRE PASS 1.3MM (BIT) IMPLANT
BLADE 10 SAFETY STRL DISP (BLADE) IMPLANT
BLADE SURG 11 STRL SS (BLADE) ×3 IMPLANT
BLADE SURG ROTATE 9660 (MISCELLANEOUS) ×6 IMPLANT
BNDG COHESIVE 4X5 TAN NS LF (GAUZE/BANDAGES/DRESSINGS) IMPLANT
BRUSH SCRUB EZ PLAIN DRY (MISCELLANEOUS) ×3 IMPLANT
BUR ACORN 9.0 PRECISION (BURR) ×4 IMPLANT
BUR ACORN 9.0MM PRECISION (BURR) ×2
BUR ROUTER D-58 CRANI (BURR) ×3 IMPLANT
CANISTER SUCT 3000ML (MISCELLANEOUS) ×3 IMPLANT
CLIP RANEY DISP (INSTRUMENTS) ×3 IMPLANT
CLIP TI MEDIUM 6 (CLIP) IMPLANT
CONT SPEC 4OZ CLIKSEAL STRL BL (MISCELLANEOUS) ×6 IMPLANT
CORDS BIPOLAR (ELECTRODE) ×3 IMPLANT
COVER TABLE BACK 60X90 (DRAPES) IMPLANT
DECANTER SPIKE VIAL GLASS SM (MISCELLANEOUS) ×3 IMPLANT
DERMABOND ADVANCED (GAUZE/BANDAGES/DRESSINGS)
DERMABOND ADVANCED .7 DNX12 (GAUZE/BANDAGES/DRESSINGS) IMPLANT
DRAIN SNY WOU 7FLT (WOUND CARE) ×3 IMPLANT
DRAPE INCISE IOBAN 66X45 STRL (DRAPES) ×3 IMPLANT
DRAPE NEUROLOGICAL W/INCISE (DRAPES) ×3 IMPLANT
DRAPE SURG 17X23 STRL (DRAPES) ×6 IMPLANT
DRAPE WARM FLUID 44X44 (DRAPE) ×3 IMPLANT
DRILL WIRE PASS 1.3MM (BIT)
DRSG OPSITE 4X5.5 SM (GAUZE/BANDAGES/DRESSINGS) ×9 IMPLANT
ELECT CAUTERY BLADE 6.4 (BLADE) ×3 IMPLANT
ELECT REM PT RETURN 9FT ADLT (ELECTROSURGICAL) ×3
ELECTRODE REM PT RTRN 9FT ADLT (ELECTROSURGICAL) ×1 IMPLANT
EVACUATOR SILICONE 100CC (DRAIN) ×3 IMPLANT
GAUZE SPONGE 4X4 16PLY XRAY LF (GAUZE/BANDAGES/DRESSINGS) IMPLANT
GLOVE BIO SURGEON STRL SZ8 (GLOVE) ×3 IMPLANT
GLOVE BIOGEL PI IND STRL 7.5 (GLOVE) ×2 IMPLANT
GLOVE BIOGEL PI INDICATOR 7.5 (GLOVE) ×4
GLOVE ECLIPSE 7.5 STRL STRAW (GLOVE) ×9 IMPLANT
GLOVE EXAM NITRILE LRG STRL (GLOVE) IMPLANT
GLOVE EXAM NITRILE MD LF STRL (GLOVE) IMPLANT
GLOVE EXAM NITRILE XL STR (GLOVE) IMPLANT
GLOVE EXAM NITRILE XS STR PU (GLOVE) IMPLANT
GLOVE INDICATOR 8.5 STRL (GLOVE) ×3 IMPLANT
GOWN STRL REUS W/ TWL LRG LVL3 (GOWN DISPOSABLE) IMPLANT
GOWN STRL REUS W/ TWL XL LVL3 (GOWN DISPOSABLE) ×3 IMPLANT
GOWN STRL REUS W/TWL 2XL LVL3 (GOWN DISPOSABLE) IMPLANT
GOWN STRL REUS W/TWL LRG LVL3 (GOWN DISPOSABLE)
GOWN STRL REUS W/TWL XL LVL3 (GOWN DISPOSABLE) ×6
HEMOSTAT SURGICEL 2X14 (HEMOSTASIS) ×3 IMPLANT
HOOK DURA (MISCELLANEOUS) ×3 IMPLANT
KIT BASIN OR (CUSTOM PROCEDURE TRAY) ×3 IMPLANT
KIT ROOM TURNOVER OR (KITS) ×3 IMPLANT
NEEDLE HYPO 25X1 1.5 SAFETY (NEEDLE) ×3 IMPLANT
NS IRRIG 1000ML POUR BTL (IV SOLUTION) ×9 IMPLANT
PACK CRANIOTOMY (CUSTOM PROCEDURE TRAY) ×3 IMPLANT
PAD ARMBOARD 7.5X6 YLW CONV (MISCELLANEOUS) ×9 IMPLANT
PAD EYE OVAL STERILE LF (GAUZE/BANDAGES/DRESSINGS) IMPLANT
PATTIES SURGICAL .25X.25 (GAUZE/BANDAGES/DRESSINGS) IMPLANT
PATTIES SURGICAL .5 X.5 (GAUZE/BANDAGES/DRESSINGS) IMPLANT
PATTIES SURGICAL .5 X3 (DISPOSABLE) IMPLANT
PATTIES SURGICAL 1X1 (DISPOSABLE) IMPLANT
PIN MAYFIELD SKULL DISP (PIN) IMPLANT
PLATE 1.5  2HOLE LNG NEURO (Plate) ×2 IMPLANT
PLATE 1.5 2HOLE LNG NEURO (Plate) ×1 IMPLANT
PLATE 1.5 6HOLE XLONG DBL Y (Plate) ×6 IMPLANT
SCREW SELF DRILL HT 1.5/4MM (Screw) ×30 IMPLANT
SPONGE GAUZE 4X4 12PLY (GAUZE/BANDAGES/DRESSINGS) ×3 IMPLANT
SPONGE NEURO XRAY DETECT 1X3 (DISPOSABLE) IMPLANT
SPONGE SURGIFOAM ABS GEL 100 (HEMOSTASIS) ×3 IMPLANT
STAPLER SKIN PROX WIDE 3.9 (STAPLE) ×3 IMPLANT
STAPLER VISISTAT 35W (STAPLE) IMPLANT
SUT ETHILON 3 0 FSL (SUTURE) IMPLANT
SUT NURALON 4 0 TR CR/8 (SUTURE) ×3 IMPLANT
SUT VIC AB 2-0 CT1 18 (SUTURE) ×9 IMPLANT
SYR 20ML ECCENTRIC (SYRINGE) ×3 IMPLANT
SYR CONTROL 10ML LL (SYRINGE) ×3 IMPLANT
TOWEL OR 17X24 6PK STRL BLUE (TOWEL DISPOSABLE) ×6 IMPLANT
TOWEL OR 17X26 10 PK STRL BLUE (TOWEL DISPOSABLE) ×3 IMPLANT
TRAY FOLEY CATH 14FRSI W/METER (CATHETERS) IMPLANT
UNDERPAD 30X30 INCONTINENT (UNDERPADS AND DIAPERS) IMPLANT
WATER STERILE IRR 1000ML POUR (IV SOLUTION) ×3 IMPLANT

## 2014-04-19 NOTE — ED Notes (Signed)
MD Campos at bedside.  

## 2014-04-19 NOTE — ED Notes (Signed)
Report to [facility] with [facility]

## 2014-04-19 NOTE — Anesthesia Preprocedure Evaluation (Signed)
Anesthesia Evaluation  Patient identified by MRN, date of birth, ID band Patient awake  General Assessment Comment:History noted. CE  Reviewed: Allergy & Precautions, H&P , NPO status , Patient's Chart, lab work & pertinent test results  Airway Mallampati: II      Dental   Pulmonary Current Smoker,          Cardiovascular hypertension, + CAD     Neuro/Psych    GI/Hepatic Neg liver ROS, hiatal hernia,   Endo/Other    Renal/GU      Musculoskeletal   Abdominal   Peds  Hematology   Anesthesia Other Findings   Reproductive/Obstetrics                           Anesthesia Physical Anesthesia Plan  ASA: IV and emergent  Anesthesia Plan: General   Post-op Pain Management:    Induction: Intravenous  Airway Management Planned: Oral ETT  Additional Equipment:   Intra-op Plan:   Post-operative Plan: Possible Post-op intubation/ventilation  Informed Consent: I have reviewed the patients History and Physical, chart, labs and discussed the procedure including the risks, benefits and alternatives for the proposed anesthesia with the patient or authorized representative who has indicated his/her understanding and acceptance.   Dental advisory given  Plan Discussed with: CRNA, Anesthesiologist and Surgeon  Anesthesia Plan Comments:         Anesthesia Quick Evaluation

## 2014-04-19 NOTE — ED Notes (Signed)
Pt reports he is on blood thinners, was taking some pills and choked and unsure if he passed out. Fell onto left side. Laceration to left forehead, abrasion to left hand, reports abrasions to knees as well. Reports daily ETOH and cigarette use. Pain 7/10

## 2014-04-19 NOTE — Progress Notes (Signed)
Escorted patient to OR holding area. Vital signs and assessment reviewed with receiving doctor. Emotional support given to patient and family.

## 2014-04-19 NOTE — Consult Note (Signed)
Reason for Consult: Subdural hematoma Referring Physician: Emergency department  Alejandro [name] is an 68 y.o. male.  HPI: Patient is a 68 year old gentleman with a history of atrial fibrillation coronary artery disease and hypertension who is on Coumadin for A. fib who fell out of his truck and striking view left side of his head as well as his knees and arms. Start experiencing worsening headache and dizziness was brought to the wasn't long UR was evaluated with a very large right-sided subdural hematoma with temporal contusion patient has been transferred to [facility]. The patient was noted to have an INR of 2.5 on admission to [facility]  Past Medical History  Diagnosis Date  . Systolic heart failure   . 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 Dyslipidemia   . HTN (hypertension)   . Atrial fibrillation   . Cancer     around nose  . Tobacco abuse   . Alcohol abuse     Past Surgical History  Procedure Laterality Date  . Coronary artery bypass graft  12/24/2008    4 vessel  . Umbilical hernia repair  2010  . Hernia repair  2012  . Coronary artery bypass graft  2012  . Ventral hernia repair N/A 01/22/2013    Procedure: HERNIA REPAIR VENTRAL ADULT;  Surgeon: [name], MD;  Location: [place];  Service: General;  Laterality: N/A;  . Application of a-cell of chest/abdomen N/A 01/22/2013    Procedure: APPLICATION OF A-CELL OF CHEST/ABDOMEN;  Surgeon: [name], MD;  Location: [place];  Service: General;  Laterality: N/A;  . Cystoscopy N/A 01/22/2013    Procedure: Consuela Mimes;  Surgeon: [name], MD;  Location: [place];  Service: Urology;  Laterality: N/A;  Cystoscopy with balloon dilation. Insertion of coude catheter.    Family History  Problem Relation Age of Onset  . Diabetes Mother   . Heart disease Mother   . Diabetes Father   . Heart disease Father   . Heart disease Brother     s/p CABG at 56  . Colon cancer Neg Hx   . Prostate cancer Neg Hx      Social History:  reports that he has been smoking Cigarettes.  He has a 50 pack-year smoking history. He has never used smokeless tobacco. He reports that he drinks alcohol. He reports that he does not use illicit drugs.  Allergies: No Known Allergies  Medications: I have reviewed the patient's current medications.  Results for orders placed during the hospital encounter of 04/19/14 (from the past 48 hour(s))  CBC WITH DIFFERENTIAL     Status: Abnormal   Collection Time    04/19/14  1:15 PM      Result Value Ref Range   WBC 14.1 (*) 4.0 - 10.5 K/uL   RBC 4.65  4.22 - 5.81 MIL/uL   Hemoglobin 14.2  13.0 - 17.0 g/dL   HCT 40.0  39.0 - 52.0 %   MCV 86.0  78.0 - 100.0 fL   MCH 30.5  26.0 - 34.0 pg   MCHC 35.5  30.0 - 36.0 g/dL   RDW 13.3  11.5 - 15.5 %   Platelets 237  150 - 400 K/uL   Neutrophils Relative % 86 (*) 43 - 77 %   Neutro Abs 12.2 (*) 1.7 - 7.7 K/uL   Lymphocytes Relative 8 (*) 12 - 46 %   Lymphs Abs 1.1  0.7 -  4.0 K/uL   Monocytes Relative 5  3 - 12 %   Monocytes Absolute 0.6  0.1 - 1.0 K/uL   Eosinophils Relative 1  0 - 5 %   Eosinophils Absolute 0.2  0.0 - 0.7 K/uL   Basophils Relative 0  0 - 1 %   Basophils Absolute 0.0  0.0 - 0.1 K/uL  COMPREHENSIVE METABOLIC PANEL     Status: Abnormal   Collection Time    04/19/14  1:15 PM      Result Value Ref Range   Sodium 132 (*) 137 - 147 mEq/L   Potassium 4.1  3.7 - 5.3 mEq/L   Chloride 94 (*) 96 - 112 mEq/L   CO2 22  19 - 32 mEq/L   Glucose, Bld 134 (*) 70 - 99 mg/dL   BUN 10  6 - 23 mg/dL   Creatinine, Ser 1.09  0.50 - 1.35 mg/dL   Calcium 9.4  8.4 - 10.5 mg/dL   Total Protein 8.1  6.0 - 8.3 g/dL   Albumin 4.2  3.5 - 5.2 g/dL   AST 23  0 - 37 U/L   ALT 18  0 - 53 U/L   Alkaline Phosphatase 64  39 - 117 U/L   Total Bilirubin 0.7  0.3 - 1.2 mg/dL   GFR calc non Af Amer 68 (*) >90 mL/min   GFR calc Af Amer 79 (*) >90 mL/min   Comment: (NOTE)     The eGFR has been calculated using the CKD EPI equation.      This calculation has not been validated in all clinical situations.     eGFR's persistently <90 mL/min signify possible Chronic Kidney     Disease.  PROTIME-INR     Status: Abnormal   Collection Time    04/19/14  1:15 PM      Result Value Ref Range   Prothrombin Time 26.1 (*) 11.6 - 15.2 seconds   INR 2.49 (*) 0.00 - 1.49  TROPONIN I     Status: None   Collection Time    04/19/14  1:15 PM      Result Value Ref Range   Troponin I <0.30  <0.30 ng/mL   Comment:            Due to the release kinetics of cTnI,     a negative result within the first hours     of the onset of symptoms does not rule out     myocardial infarction with certainty.     If myocardial infarction is still suspected,     repeat the test at appropriate intervals.    Ct Head Wo Contrast  04/19/2014   CLINICAL DATA:  Choking sensation with fall and left forehead lacerations.  EXAM: CT HEAD WITHOUT CONTRAST  TECHNIQUE: Contiguous axial images were obtained from the base of the skull through the vertex without intravenous contrast.  COMPARISON:  None.  FINDINGS: There is a large acute hemispheric right subdural hematoma, measuring up to 2.0 cm in thickness. There is resulting midline shift by 1 cm. A small amount of blood tracks along the right tentorium. Posteriorly in the right temporal lobe is a probable adjacent focus of hemorrhagic contusion measuring up to 2.3 x 2.7 cm transverse on image 11.  Patchy periventricular white matter disease is present bilaterally. 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 No signs of acute stroke are demonstrated. There is no hydrocephalus or intraventricular hemorrhage.  The visualized paranasal sinuses, mastoid  air cells and middle ears are clear. The calvarium is intact.  IMPRESSION: 1. Large right hemispheric subdural hematoma with associated midline shift. 2. Moderate size hemorrhagic contusion involving the right temporal lobe posteriorly. 3. Underlying periventricular white  matter disease. No signs of acute stroke or hydrocephalus. 4. Critical Value/emergent results were called by telephone at the time of interpretation on 04/19/2014 at 1:45 PM to Dr. Jola Schmidt, who verbally acknowledged these results.   Electronically Signed   By: Camie Patience M.D.   On: 04/19/2014 13:50    Review of Systems  Constitutional: Negative.   Eyes: Negative.   Respiratory: Negative.   Cardiovascular: Negative.   Gastrointestinal: Positive for nausea.  Genitourinary: Negative.   Musculoskeletal: Positive for back pain.  Skin: Negative.   Neurological: Positive for headaches.  Psychiatric/Behavioral: Negative.    Blood pressure 197/95, pulse 72, resp. rate 12, weight 84.823 kg (187 lb), SpO2 99.00%. Physical Exam  Constitutional: He is oriented to person, place, and time.  Eyes: Pupils are equal, round, and reactive to light.  Neurological: He is alert and oriented to person, place, and time. He has normal strength. GCS eye subscore is 4. GCS verbal subscore is 5. GCS motor subscore is 6.  Patient is awake alert oriented x4 pupils are equal extraocular movements are intact strength is 5 out of 5 all extremities    Assessment/Plan: 59 [facility] to [facility] for evaluation management of a right-sided acute subdural hematoma. This has been admitted to critical care medicine  Was initiat on the rapid reversal protocol, we have PT and INR pending when his INR gets less than 1.3 were proceed forward with craniotomy.   [name] 04/19/2014, 3:15 PM

## 2014-04-19 NOTE — H&P (Signed)
PULMONARY / CRITICAL CARE MEDICINE   Name: Alejandro [name] MRN: [id number] DOB: 1946-06-03    ADMISSION DATE:  04/19/2014 CONSULTATION DATE:  04/19/2014  REFERRING MD :  EDP PRIMARY SERVICE: PCCM  CHIEF COMPLAINT:  VDRF post-op crani for SDH  BRIEF PATIENT DESCRIPTION: 68 year old male with history of a-fib on coumadin presenting after a fall.  He does not recall any trauma but does have a laceration on his head.  In the ED a head CT was done that revealed SDH with midline shift, hemorrhagic contusion on the right temporal lobe and white matter disease.  Neurosurgery would like to take patient to the OR and PCCM was consulted to stabilize prior to surgery.  SIGNIFICANT EVENTS / STUDIES:  5/18 fall with SDU  LINES / TUBES: PIV  CULTURES: None  ANTIBIOTICS: None  PAST MEDICAL HISTORY :  Past Medical History  Diagnosis Date  . Systolic heart failure   . 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 Dyslipidemia   . HTN (hypertension)   . Atrial fibrillation   . Cancer     around nose  . Tobacco abuse   . Alcohol abuse    Past Surgical History  Procedure Laterality Date  . Coronary artery bypass graft  12/24/2008    4 vessel  . Umbilical hernia repair  2010  . Hernia repair  2012  . Coronary artery bypass graft  2012  . Ventral hernia repair N/A 01/22/2013    Procedure: HERNIA REPAIR VENTRAL ADULT;  Surgeon: [name], MD;  Location: [place];  Service: General;  Laterality: N/A;  . Application of a-cell of chest/abdomen N/A 01/22/2013    Procedure: APPLICATION OF A-CELL OF CHEST/ABDOMEN;  Surgeon: [name], MD;  Location: [place];  Service: General;  Laterality: N/A;  . Cystoscopy N/A 01/22/2013    Procedure: Consuela Mimes;  Surgeon: [name], MD;  Location: [place];  Service: Urology;  Laterality: N/A;  Cystoscopy with balloon dilation. Insertion of coude catheter.   Prior to Admission medications   Medication Sig Start Date End Date Taking?  Authorizing Provider  carvedilol (COREG) 25 MG tablet Take 1 tablet (25 mg total) by mouth 2 (two) times daily with a meal. 03/01/14  Yes Burtis Junes, NP  furosemide (LASIX) 80 MG tablet Take 0.5 tablets (40 mg total) by mouth daily. 03/01/14  Yes [name], MD  ibuprofen (ADVIL,MOTRIN) 200 MG tablet Take 200 mg by mouth every 6 (six) hours as needed for pain.    Yes Historical Provider, MD  lisinopril (PRINIVIL,ZESTRIL) 20 MG tablet TAKE 1 TABLET EVERY DAY   Yes [name], MD  simvastatin (ZOCOR) 40 MG tablet Take 40 mg by mouth every evening.   Yes Historical Provider, MD  warfarin (COUMADIN) 2 MG tablet Take 2-4 mg by mouth daily. Takes 2mg  every day but Monday and takes 4mg  on monday   Yes Historical Provider, MD   No Known Allergies  FAMILY HISTORY:  Family History  Problem Relation Age of Onset  . Diabetes Mother   . Heart disease Mother   . Diabetes Father   . Heart disease Father   . Heart disease Brother     s/p CABG at 44  . Colon cancer Neg Hx   . Prostate cancer Neg Hx    SOCIAL HISTORY:  reports that he has been smoking Cigarettes.  He has a 50 pack-year smoking history. He has never  used smokeless tobacco. He reports that he drinks alcohol. He reports that he does not use illicit drugs.  REVIEW OF SYSTEMS:  Unattainable, patient not full communicative.  SUBJECTIVE:   VITAL SIGNS: Pulse Rate:  [76-84] 76 (05/18 1320) Resp:  [15-20] 15 (05/18 1320) BP: (193-212)/(87-110) 193/87 mmHg (05/18 1321) SpO2:  [91 %-98 %] 98 % (05/18 1320) Weight:  [187 lb (84.823 kg)] 187 lb (84.823 kg) (05/18 1350) HEMODYNAMICS:   VENTILATOR SETTINGS:   INTAKE / OUTPUT: Intake/Output   None     PHYSICAL EXAMINATION: General: Obese, laceration on the head, awake. Neuro: Alert and oriented, following commands, sleepy but easily arousable. HEENT: Trauma at left side of the head, PERRL, EOM-I and MMM. Cardiovascular:  RRR, Nl S1/S2, -M/R/G. Lungs:  CTA  bilaterally. Abdomen:  Soft, NT, ND and +BS. Musculoskeletal:  1+ edema and -tenderness.  Left knee laceration. Skin:  Lacerations as above.  LABS:  CBC  Recent Labs Lab 04/19/14 1315  WBC 14.1*  HGB 14.2  HCT 40.0  PLT 237   Coag's  Recent Labs Lab 04/19/14 1315  INR 2.49*   BMET  Recent Labs Lab 04/19/14 1315  NA 132*  K 4.1  CL 94*  CO2 22  BUN 10  CREATININE 1.09  GLUCOSE 134*   Electrolytes  Recent Labs Lab 04/19/14 1315  CALCIUM 9.4   Sepsis Markers No results found for this basename: LATICACIDVEN, PROCALCITON, O2SATVEN,  in the last 168 hours ABG No results found for this basename: PHART, PCO2ART, PO2ART,  in the last 168 hours Liver Enzymes  Recent Labs Lab 04/19/14 1315  AST 23  ALT 18  ALKPHOS 64  BILITOT 0.7  ALBUMIN 4.2   Cardiac Enzymes  Recent Labs Lab 04/19/14 1315  TROPONINI <0.30   Glucose No results found for this basename: GLUCAP,  in the last 168 hours  Imaging Ct Head Wo Contrast  04/19/2014   CLINICAL DATA:  Choking sensation with fall and left forehead lacerations.  EXAM: CT HEAD WITHOUT CONTRAST  TECHNIQUE: Contiguous axial images were obtained from the base of the skull through the vertex without intravenous contrast.  COMPARISON:  None.  FINDINGS: There is a large acute hemispheric right subdural hematoma, measuring up to 2.0 cm in thickness. There is resulting midline shift by 1 cm. A small amount of blood tracks along the right tentorium. Posteriorly in the right temporal lobe is a probable adjacent focus of hemorrhagic contusion measuring up to 2.3 x 2.7 cm transverse on image 11.  Patchy periventricular white matter disease is present bilaterally. 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 No signs of acute stroke are demonstrated. There is no hydrocephalus or intraventricular hemorrhage.  The visualized paranasal sinuses, mastoid air cells and middle ears are clear. The calvarium is intact.  IMPRESSION:  1. Large right hemispheric subdural hematoma with associated midline shift. 2. Moderate size hemorrhagic contusion involving the right temporal lobe posteriorly. 3. Underlying periventricular white matter disease. No signs of acute stroke or hydrocephalus. 4. Critical Value/emergent results were called by telephone at the time of interpretation on 04/19/2014 at 1:45 PM to Dr. Jola Schmidt, who verbally acknowledged these results.   Electronically Signed   By: Camie Patience M.D.   On: 04/19/2014 13:50     CXR: Pending  ASSESSMENT / PLAN:  PULMONARY A: Protecting airway, anticipate will be intubated post op. P:   - CXR now. - Will evaluate post-op to see if extubated or not.  CARDIOVASCULAR A: HTN,  005 systplic. P:  - Cardene drip for BP control with target systolic <110.  - Reverse anti-coagulation. - Hold further anti-coagulation.  RENAL A:  No active issues. P:   - Daily BMET. - Replace electrolytes as indicated.  GASTROINTESTINAL A:  Etoh history. P:   - NPO. - Swallow evaluation pending post op status. - Check LFTs.  HEMATOLOGIC A:  Elevated INR on coumadin for a-fib. P:  - Reverse coumadin. - Hold anti-coag. - If INR is 1.3 or less then to the OR.  INFECTIOUS A:  No active issues. P:   - Monitor WBC and fever curves. - No abx for now.  ENDOCRINE A:  No diabetes by history.   P:   - Monitor.  NEUROLOGIC A:  SDH with small ICH.  Etoh abuse. P:   - Thiamine/folate/MVI. - CIWA protocol. - Monitor for withdraw.  TODAY'S SUMMARY: Traumatic SDH, to OR for crani, reverse anti-coag and no further anticoagulation until ok with NS.  I have personally obtained a history, examined the patient, evaluated laboratory and imaging results, formulated the assessment and plan and placed orders.  CRITICAL CARE: The patient is critically ill with multiple organ systems failure and requires high complexity decision making for assessment and support, frequent evaluation and  titration of therapies, application of advanced monitoring technologies and extensive interpretation of multiple databases. Critical Care Time devoted to patient care services described in this note is 45 minutes.   Rush Farmer, M.D. [facility] Pulmonary/Critical Care Medicine. Pager: [phone]. After hours pager: [phone].

## 2014-04-19 NOTE — ED Notes (Signed)
Pt in CT.

## 2014-04-19 NOTE — Op Note (Signed)
Preoperative diagnosis: Right-sided acute subdural hematoma  postoperative diagnosis: Same  Procedure: Right pterional craniotomy for evacuation of acute right-sided subdural hematoma  Surgeon: Dominica Severin Dannell Raczkowski  Anesthesia: Gen.  EBL: Minimal  Specimen: Hemorrhagic right temporal lobe  History of present illness: Patient is 68 year old gentleman who was not feeling well.gastroc fell landed on his head liner gram 30 minutes finger up moving around. We'll progressively worse as picked up by EMS transported to the way it was a long emergency department where is evaluated with altered mental status CT scan showed a large right-sided subdural hematoma patient was on Coumadin with an INR of 2.5 he was emergently transferred to the ICU was reversed in his coagulopathy and taken emergently to the or.   operative procedure: Patient brought into the or was induced under general anesthesia positioned supine with a shoulder bump under his right shoulder his head turned the left and the right side of his head shaved prepped and draped in routine sterile fashion. I also prepped out the right upper quadrant of his abdomen a pterional incision Was drawn out on his head and after infiltration 10 cc lidocaine of lidocaine with epi this was incised rainy clips were applied. The temporalis was divided in a T-shaped fashion 3 bur holes were drilled one the infratemporal fossa one in the keyhole and one posteriorly the gray flap is then turned the dura was markedly tense it was incised and flapped over the sphenoid a large acute hematoma was immediately visualized and a large portion of this clot came out en bloc and on mass. I explored and a 360 orientation the entire convexity removed all acute blood there is an area down the inferior aspect of the right temporal lobe that was hemorrhagic and contused and looked abnormal I sent a few specimens of abnormal brain for permanent pathology. Coagulated this area and then after  adequate hemostasis again achieved I did coagulate some bleeding bridging veins and not see any more acute bleeding or blood. I then closed the dura lay Gelfoam over the top of the dura reapplied the craniotomy flap and the Biomet plating system closed temporalis with interrupted Vicryl's and closed the scalp with interrupted Vicryl's. Staples are placed in the skin the wound was dressed and patient went to the ICU in stable condition. At the end of case on it counts sponge counts were correct.

## 2014-04-19 NOTE — ED Notes (Signed)
[facility] at bedside 

## 2014-04-19 NOTE — Transfer of Care (Signed)
Immediate Anesthesia Transfer of Care Note  Patient: Alejandro [name]  Procedure(s) Performed: Procedure(s) with comments: CRANIOTOMY HEMATOMA EVACUATION SUBDURAL (Right) - right  Patient Location: PACU  Anesthesia Type:General  Level of Consciousness: alert , oriented, patient cooperative and responds to stimulation  Airway & Oxygen Therapy: Patient Spontanous Breathing and Patient connected to face mask oxygen  Post-op Assessment: Report given to PACU RN, Post -op Vital signs reviewed and stable and Patient moving all extremities X 4  Post vital signs: Reviewed and stable  Complications: No apparent anesthesia complications

## 2014-04-19 NOTE — Plan of Care (Signed)
Problem: Consults Goal: Diagnosis - Craniotomy Outcome: Completed/Met Date Met:  04/19/14 Subdural hematoma

## 2014-04-19 NOTE — Anesthesia Postprocedure Evaluation (Signed)
  Anesthesia Post-op Note  Patient: Alejandro [name]  Procedure(s) Performed: Procedure(s) with comments: CRANIOTOMY HEMATOMA EVACUATION SUBDURAL (Right) - right  Patient Location: PACU  Anesthesia Type:General  Level of Consciousness: awake and alert   Airway and Oxygen Therapy: Patient Spontanous Breathing  Post-op Pain: moderate  Post-op Assessment: Post-op Vital signs reviewed, Patient's Cardiovascular Status Stable and Respiratory Function Stable  Post-op Vital Signs: Reviewed  Filed Vitals:   04/19/14 2100  BP: 140/79  Pulse: 79  Temp:   Resp: 20    Complications: No apparent anesthesia complications

## 2014-04-19 NOTE — ED Provider Notes (Signed)
CSN: [id number]     Arrival date & time 04/19/14  1235 History   First MD Initiated Contact with Patient 04/19/14 1306     Chief Complaint  Patient presents with  . Fall  . on blood thinners       HPI Is reported the patient was going out to his car to take some ibuprofen for chronic back pain when he was getting out of car he fell and hit his head.  He is able to get himself back into the car.  He denies neck pain this time.  He reports headache.  He has a small abrasion to his left temporal region.  He is on Coumadin for history of atrial fibrillation.  Pain at this time is mild to moderate in severity. He's been nauseated.  No vomiting.  His wife reports that he looks pale and sweaty.   Past Medical History  Diagnosis Date  . Systolic heart failure   . 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 Dyslipidemia   . HTN (hypertension)   . Atrial fibrillation   . Cancer     around nose  . Tobacco abuse   . Alcohol abuse    Past Surgical History  Procedure Laterality Date  . Coronary artery bypass graft  12/24/2008    4 vessel  . Umbilical hernia repair  2010  . Hernia repair  2012  . Coronary artery bypass graft  2012  . Ventral hernia repair N/A 01/22/2013    Procedure: HERNIA REPAIR VENTRAL ADULT;  Surgeon: [name], MD;  Location: [place];  Service: General;  Laterality: N/A;  . Application of a-cell of chest/abdomen N/A 01/22/2013    Procedure: APPLICATION OF A-CELL OF CHEST/ABDOMEN;  Surgeon: [name], MD;  Location: [place];  Service: General;  Laterality: N/A;  . Cystoscopy N/A 01/22/2013    Procedure: Consuela Mimes;  Surgeon: [name], MD;  Location: [place];  Service: Urology;  Laterality: N/A;  Cystoscopy with balloon dilation. Insertion of coude catheter.   Family History  Problem Relation Age of Onset  . Diabetes Mother   . Heart disease Mother   . Diabetes Father   . Heart disease Father   . Heart disease Brother     s/p CABG at 19   . Colon cancer Neg Hx   . Prostate cancer Neg Hx    History  Substance Use Topics  . Smoking status: Current Some Day Smoker -- 1.00 packs/day for 50 years    Types: Cigarettes    Last Attempt to Quit: 12/21/2012  . Smokeless tobacco: Never Used  . Alcohol Use: Yes     Comment: 4-5  12oz beers daily    Review of Systems  All other systems reviewed and are negative.     Allergies  Review of patient's allergies indicates no known allergies.  Home Medications   Prior to Admission medications   Medication Sig Start Date End Date Taking? Authorizing Provider  carvedilol (COREG) 25 MG tablet Take 1 tablet (25 mg total) by mouth 2 (two) times daily with a meal. 03/01/14  Yes Burtis Junes, NP  furosemide (LASIX) 80 MG tablet Take 0.5 tablets (40 mg total) by mouth daily. 03/01/14  Yes [name], MD  ibuprofen (ADVIL,MOTRIN) 200 MG tablet Take 200 mg by mouth every 6 (six) hours as needed for pain.    Yes Historical Provider, MD  lisinopril (PRINIVIL,ZESTRIL) 20 MG tablet  TAKE 1 TABLET EVERY DAY   Yes [name], MD  simvastatin (ZOCOR) 40 MG tablet Take 40 mg by mouth every evening.   Yes Historical Provider, MD  warfarin (COUMADIN) 2 MG tablet Take 2-4 mg by mouth daily. Takes 2mg  every day but Monday and takes 4mg  on monday   Yes Historical Provider, MD   BP 193/87  Pulse 76  Resp 15  Wt 187 lb (84.823 kg)  SpO2 98% Physical Exam  Nursing note and vitals reviewed. Constitutional: He is oriented to person, place, and time. He appears well-developed and well-nourished.  Pale  HENT:  Head: Normocephalic and atraumatic.  Eyes: EOM are normal.  Neck: Normal range of motion. Neck supple.  No C-spine tenderness  Cardiovascular: Normal rate, regular rhythm, normal heart sounds and intact distal pulses.   Pulmonary/Chest: Effort normal and breath sounds normal. No respiratory distress.  Abdominal: Soft. He exhibits no distension.  Musculoskeletal: Normal range of  motion.  Small abrasions to right finger eminence and dorsal surface of left hand.  Small abrasion to left knee.  Full range of motion of left knee.  Neurological: He is alert and oriented to person, place, and time.  5 Out of 5 strength in bilateral upper and lower extremity nature muscle groups.  Skin: Skin is warm and dry.  Psychiatric: He has a normal mood and affect. Judgment normal.    ED Course  Procedures (including critical care time) CRITICAL CARE Performed by: Hoy Morn Total critical care time: 35 Critical care time was exclusive of separately billable procedures and treating other patients. Critical care was necessary to treat or prevent imminent or life-threatening deterioration. Critical care was time spent personally by me on the following activities: development of treatment plan with patient and/or surrogate as well as nursing, discussions with consultants, evaluation of patient's response to treatment, examination of patient, obtaining history from patient or surrogate, ordering and performing treatments and interventions, ordering and review of laboratory studies, ordering and review of radiographic studies, pulse oximetry and re-evaluation of patient's condition.   Labs Review Labs Reviewed  CBC WITH DIFFERENTIAL - Abnormal; Notable for the following:    WBC 14.1 (*)    Neutrophils Relative % 86 (*)    Neutro Abs 12.2 (*)    Lymphocytes Relative 8 (*)    All other components within normal limits  COMPREHENSIVE METABOLIC PANEL - Abnormal; Notable for the following:    Sodium 132 (*)    Chloride 94 (*)    Glucose, Bld 134 (*)    GFR calc non Af Amer 68 (*)    GFR calc Af Amer 79 (*)    All other components within normal limits  PROTIME-INR - Abnormal; Notable for the following:    Prothrombin Time 26.1 (*)    INR 2.49 (*)    All other components within normal limits  TROPONIN I    Imaging Review Ct Head Wo Contrast  04/19/2014   CLINICAL DATA:   Choking sensation with fall and left forehead lacerations.  EXAM: CT HEAD WITHOUT CONTRAST  TECHNIQUE: Contiguous axial images were obtained from the base of the skull through the vertex without intravenous contrast.  COMPARISON:  None.  FINDINGS: There is a large acute hemispheric right subdural hematoma, measuring up to 2.0 cm in thickness. There is resulting midline shift by 1 cm. A small amount of blood tracks along the right tentorium. Posteriorly in the right temporal lobe is a probable adjacent focus of hemorrhagic contusion measuring  up to 2.3 x 2.7 cm transverse on image 11.  Patchy periventricular white matter disease is present bilaterally. 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 No signs of acute stroke are demonstrated. There is no hydrocephalus or intraventricular hemorrhage.  The visualized paranasal sinuses, mastoid air cells and middle ears are clear. The calvarium is intact.  IMPRESSION: 1. Large right hemispheric subdural hematoma with associated midline shift. 2. Moderate size hemorrhagic contusion involving the right temporal lobe posteriorly. 3. Underlying periventricular white matter disease. No signs of acute stroke or hydrocephalus. 4. Critical Value/emergent results were called by telephone at the time of interpretation on 04/19/2014 at 1:45 PM to Dr. Jola Schmidt, who verbally acknowledged these results.   Electronically Signed   By: Camie Patience M.D.   On: 04/19/2014 13:50  I personally reviewed the imaging tests through PACS system I reviewed available ER/hospitalization records through the EMR   ECG interpretation  Date: 04/19/2014  Rate: 75  Rhythm: normal sinus rhythm  QRS Axis: normal  Intervals: normal  ST/T Wave abnormalities: Right bundle branch block, nonspecific inferolateral ST change Conduction Disutrbances: none  Narrative Interpretation:   Old EKG Reviewed: Nonspecific ST and T wave changes noted     MDM   Final diagnoses:  Subdural  hematoma   Concerning for intercranial bleed.  Stat head CT now.  INR pending.   Spoke with Dr Kary Kos, agrees with rapid reversal protocol.  Case interpreted be started now.  He'll plan to evacuate the subdural hematoma once complete worse was been achieved.  Emergent transfer to [facility] neuro ICU.  2:08 PM Spoke with PCCM, accepts in transfer to neuro ICU  Hoy Morn, MD 04/19/14 1409

## 2014-04-20 ENCOUNTER — Encounter (HOSPITAL_COMMUNITY): Payer: Self-pay | Admitting: Neurosurgery

## 2014-04-20 ENCOUNTER — Inpatient Hospital Stay (HOSPITAL_COMMUNITY): Payer: BC Managed Care – PPO

## 2014-04-20 DIAGNOSIS — I4891 Unspecified atrial fibrillation: Secondary | ICD-10-CM

## 2014-04-20 LAB — CBC
HCT: 34.3 % — ABNORMAL LOW (ref 39.0–52.0)
HEMOGLOBIN: 12 g/dL — AB (ref 13.0–17.0)
MCH: 30.5 pg (ref 26.0–34.0)
MCHC: 35 g/dL (ref 30.0–36.0)
MCV: 87.1 fL (ref 78.0–100.0)
Platelets: 210 10*3/uL (ref 150–400)
RBC: 3.94 MIL/uL — ABNORMAL LOW (ref 4.22–5.81)
RDW: 13.6 % (ref 11.5–15.5)
WBC: 13.6 10*3/uL — ABNORMAL HIGH (ref 4.0–10.5)

## 2014-04-20 LAB — BASIC METABOLIC PANEL
BUN: 14 mg/dL (ref 6–23)
CO2: 21 mEq/L (ref 19–32)
CREATININE: 0.99 mg/dL (ref 0.50–1.35)
Calcium: 8.2 mg/dL — ABNORMAL LOW (ref 8.4–10.5)
Chloride: 102 mEq/L (ref 96–112)
GFR calc non Af Amer: 82 mL/min — ABNORMAL LOW (ref >90)
Glucose, Bld: 153 mg/dL — ABNORMAL HIGH (ref 70–99)
POTASSIUM: 3.8 meq/L (ref 3.7–5.3)
Sodium: 136 mEq/L — ABNORMAL LOW (ref 137–147)

## 2014-04-20 LAB — PHOSPHORUS: PHOSPHORUS: 2.2 mg/dL — AB (ref 2.3–4.6)

## 2014-04-20 LAB — MAGNESIUM: Magnesium: 1.8 mg/dL (ref 1.5–2.5)

## 2014-04-20 LAB — PROTIME-INR
INR: 1.25 (ref 0.00–1.49)
PROTHROMBIN TIME: 15.4 s — AB (ref 11.6–15.2)

## 2014-04-20 MED ORDER — FENTANYL CITRATE 0.05 MG/ML IJ SOLN
25.0000 ug | INTRAMUSCULAR | Status: DC | PRN
  Administered 2014-04-21: 75 ug via INTRAVENOUS
  Administered 2014-04-21: 50 ug via INTRAVENOUS
  Administered 2014-04-21 – 2014-04-22 (×4): 75 ug via INTRAVENOUS
  Filled 2014-04-20 (×6): qty 2

## 2014-04-20 MED ORDER — FOLIC ACID 5 MG/ML IJ SOLN
1.0000 mg | Freq: Every day | INTRAMUSCULAR | Status: DC
  Administered 2014-04-21: 1 mg via INTRAVENOUS
  Filled 2014-04-20 (×2): qty 0.2

## 2014-04-20 MED ORDER — THIAMINE HCL 100 MG/ML IJ SOLN
100.0000 mg | Freq: Every day | INTRAMUSCULAR | Status: DC
  Administered 2014-04-21: 100 mg via INTRAVENOUS
  Filled 2014-04-20 (×2): qty 1

## 2014-04-20 MED ORDER — DEXMEDETOMIDINE HCL IN NACL 200 MCG/50ML IV SOLN
0.2000 ug/kg/h | INTRAVENOUS | Status: DC
Start: 2014-04-21 — End: 2014-04-21
  Administered 2014-04-21: 0.2 ug/kg/h via INTRAVENOUS
  Filled 2014-04-20: qty 50

## 2014-04-20 MED ORDER — DEXTROSE 5 % IV SOLN
15.0000 mmol | Freq: Once | INTRAVENOUS | Status: AC
  Administered 2014-04-20: 15 mmol via INTRAVENOUS
  Filled 2014-04-20: qty 5

## 2014-04-20 MED ORDER — LORAZEPAM 2 MG/ML IJ SOLN
1.0000 mg | INTRAMUSCULAR | Status: DC | PRN
  Administered 2014-04-20 (×3): 1 mg via INTRAVENOUS
  Filled 2014-04-20 (×3): qty 1

## 2014-04-20 NOTE — Evaluation (Signed)
Occupational Therapy Evaluation Patient Details Name: Alejandro [name] MRN: [id number] DOB: 10-11-1946 Today's Date: 04/20/2014    History of Present Illness 68 yo with AF on Coumadin admitted after fall.  Head CT revealed SDH with midline shift, and hemorrhagic contusion of the right temporal lobe. Underwent craniotomy and hematoma evacuation on 5/18.   Clinical Impression   Pt admitted with above. She demonstrates the below listed deficits and will benefit from continued OT to maximize safety and independence with BADLs.  Pt presents to OT cognitive deficits including attention, problem solving, requires use of calendar for orientation to time.  He demonstrates Lt. Inattention vs. Visual field deficit, impaired balance.   He was independent with all BADLs and working full time PTA.   Feel he would benefit from CIR prior to discharge home.  Currently, he requires mod A for LB ADLs.      Follow Up Recommendations  CIR;Supervision/Assistance - 24 hour    Equipment Recommendations  3 in 1 bedside comode;Tub/shower bench    Recommendations for Other Services Rehab consult     Precautions / Restrictions Precautions Precautions: Fall Restrictions Weight Bearing Restrictions: No      Mobility Bed Mobility Overal bed mobility: Needs Assistance Bed Mobility: Supine to Sit     Supine to sit: Min assist;HOB elevated     General bed mobility comments: v/c's for sequencing, minA for trunk elevation. pt able to move LEs to EOB, used bedrails to assist  Transfers Overall transfer level: Needs assistance Equipment used: 1 person hand held assist Transfers: Sit to/from Stand Sit to Stand: Min assist         General transfer comment: Pt reliant on UE assist     Balance Overall balance assessment: Needs assistance Sitting-balance support: Feet supported Sitting balance-Leahy Scale: Good Sitting balance - Comments: sitting in chair.   Able to don/doff socks    Standing balance  support: Single extremity supported Standing balance-Leahy Scale: Poor Standing balance comment: requires RW or bilat uE support                            ADL Overall ADL's : Needs assistance/impaired Eating/Feeding: Set up;Supervision/ safety;Sitting   Grooming: Wash/dry hands;Wash/dry face;Supervision/safety;Sitting   Upper Body Bathing: Minimal assitance;Sitting   Lower Body Bathing: Moderate assistance;Sit to/from stand   Upper Body Dressing : Minimal assistance;Sitting   Lower Body Dressing: Moderate assistance;Sit to/from stand Lower Body Dressing Details (indicate cue type and reason): able to don/doff socks in sitting with supervision.  Requires assist with balance and pulling pants over hips  Toilet Transfer: Moderate assistance;Stand-pivot;BSC   Toileting- Clothing Manipulation and Hygiene: Moderate assistance       Functional mobility during ADLs: Moderate assistance (basic transfers) General ADL Comments: Pt slow to respond and lethargic throughout OT eval      Vision Eye Alignment: Within Functional Limits   Ocular Range of Motion: Within Functional Limits Tracking/Visual Pursuits: Able to track stimulus in all quads without difficulty         Additional Comments: Pt able to correctly identify stimulus presented on Lt. when stimuli presented on Lt only; however, does not identify stimuli on Lt if two stimuli simultaneously presented on Lt and Rt.     Perception Perception Perception Tested?: Yes Perception Deficits: Inattention/neglect Inattention/Neglect: Does not attend to right visual field Spatial deficits: Pt with questionable Lt. visual inattention vs visual field deficit.  Pt fatigued and difficult to accurately assess  Praxis      Pertinent Vitals/Pain Complaint of headache 6/10.  Pt repositioned.  RN aware.      Hand Dominance Right   Extremity/Trunk Assessment Upper Extremity Assessment Upper Extremity Assessment: Generalized  weakness   Lower Extremity Assessment Lower Extremity Assessment: Defer to PT evaluation   Cervical / Trunk Assessment Cervical / Trunk Assessment: Normal   Communication Communication Communication: No difficulties   Cognition Arousal/Alertness: Lethargic Behavior During Therapy: Flat affect Overall Cognitive Status: Impaired/Different from baseline Area of Impairment: Orientation;Problem solving Orientation Level: Disoriented to;Time (reliant on calendar)           Problem Solving: Slow processing;Requires verbal cues General Comments: Pt lethargic during OT eval.  Pt able to read clock correctly, and able to describe events of day.  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, community    OT Diagnosis: Generalized weakness;Cognitive deficits;Disturbance of vision;Acute pain   OT Problem List: Decreased strength;Decreased activity tolerance;Impaired balance (sitting and/or standing);Impaired vision/perception;Decreased cognition;Decreased safety awareness;Decreased knowledge of use of DME or AE;Obesity;Pain   OT Treatment/Interventions: Self-care/ADL training;DME and/or AE instruction;Therapeutic activities;Cognitive remediation/compensation;Visual/perceptual remediation/compensation;Patient/family education;Balance training    OT Goals(Current goals can be found in the care plan section) Acute Rehab OT Goals Patient Stated Goal: to get better OT Goal Formulation: With patient Time For Goal Achievement: 05/04/14 Potential to Achieve  Goals: Good ADL Goals Pt Will Perform Grooming: with min guard assist;standing Pt Will Perform Upper Body Bathing: with set-up;sitting Pt Will Perform Lower Body Bathing: with min guard assist;sit to/from stand Pt Will Perform Upper Body Dressing: with supervision;sitting Pt Will Perform Lower Body Dressing: with min guard assist;sit to/from stand Pt Will Transfer to Toilet: with min guard assist;ambulating;regular height toilet Pt Will Perform Toileting - Clothing Manipulation and hygiene: with min guard assist;sit to/from stand Additional ADL Goal #1: Pt will locate items on Lt in unstructured, visually challenging environment with no verbal cues  OT Frequency: Min 2X/week   Barriers to D/C: Decreased caregiver support  unsure if wife can provide 24 hour assist       Co-evaluation              End of Session Nurse Communication: Mobility status  Activity Tolerance: Patient limited by fatigue Patient left: in chair;with call bell/phone within reach   Time: [id number] OT Time Calculation (min): 17 min Charges:  OT General Charges $OT Visit: 1 Procedure OT Evaluation $Initial OT Evaluation Tier I: 1 Procedure OT Treatments $Self Care/Home Management : 8-22 mins G-Codes:    Alejandro [name] 05/05/2014, 12:12 PM

## 2014-04-20 NOTE — Progress Notes (Signed)
Rehab Admissions Coordinator Note:  Patient was screened by Alejandro [name] for appropriateness for an Inpatient Acute Rehab Consult.  At this time, we are recommending Inpatient Rehab consult.  Alejandro [name] 04/20/2014, 2:05 PM  I can be reached at [phone].

## 2014-04-20 NOTE — Progress Notes (Signed)
PULMONARY / CRITICAL CARE MEDICINE   Name: Alejandro [name] MRN: [id number] DOB: 1946/04/02    ADMISSION DATE:  04/19/2014 CONSULTATION DATE:  04/19/2014  REFERRING MD :  EDP PRIMARY SERVICE: PCCM  CHIEF COMPLAINT:  VDRF post-op crani for SDH  BRIEF PATIENT DESCRIPTION: 68 yo with AF on Coumadin admitted after fall.  Head CT revealed SDH with midline shift, and hemorrhagic contusion of the right temporal lobe. Underwent craniotomy and hematoma evacuation.  SIGNIFICANT EVENTS / STUDIES:  5/18  Head CT >>> SDH with midline shift, and hemorrhagic contusion of the right temporal lobe 5/18  OR >>> Craniotomy, hematoma evacuation  LINES / TUBES:  CULTURES:  ANTIBIOTICS: Cefazolin 5/19 >>>  INTERVAL HISTORY:  No acute events overnight.  Reports some headache.  VITAL SIGNS: Temp:  [97.4 F (36.3 C)-97.8 F (36.6 C)] 97.4 F (36.3 C) (05/19 0700) Pulse Rate:  [60-112] 81 (05/19 0800) Resp:  [12-26] 19 (05/19 0800) BP: (115-212)/(49-110) 135/57 mmHg (05/19 0800) SpO2:  [87 %-99 %] 98 % (05/19 0800) Arterial Line BP: (125-219)/(42-98) 125/46 mmHg (05/19 0700) Weight:  [84.823 kg (187 lb)-109.1 kg (240 lb 8.4 oz)] 108.6 kg (239 lb 6.7 oz) (05/19 0338)  HEMODYNAMICS:   VENTILATOR SETTINGS:   INTAKE / OUTPUT: Intake/Output     05/18 0701 - 05/19 0700 05/19 0701 - 05/20 0700   I.V. (mL/kg) 3349.4 (30.8) 75 (0.7)   IV Piggyback 155    Total Intake(mL/kg) 3504.4 (32.3) 75 (0.7)   Urine (mL/kg/hr) 2100    Drains 290    Blood 100    Total Output 2490     Net +1014.4 +75         PHYSICAL EXAMINATION: General: No distress Neuro: Sleepy but wakes up to stimulation, following commands, cough / gag intact HEENT: Surgical dressing intact Cardiovascular:  Irregular, no murmurs Lungs:  CTAB Abdomen:  Soft, bowel sounds present Musculoskeletal:  Trace edema, L knee laceration. Skin:  No rash  LABS:  CBC  Recent Labs Lab 04/19/14 1315 04/20/14 0500  WBC 14.1* 13.6*  HGB 14.2  12.0*  HCT 40.0 34.3*  PLT 237 210   Coag's  Recent Labs Lab 04/19/14 1315 04/19/14 1528 04/20/14 0510  INR 2.49* 1.13 1.25   BMET  Recent Labs Lab 04/19/14 1315 04/20/14 0500  NA 132* 136*  K 4.1 3.8  CL 94* 102  CO2 22 21  BUN 10 14  CREATININE 1.09 0.99  GLUCOSE 134* 153*   Electrolytes  Recent Labs Lab 04/19/14 1315 04/20/14 0500  CALCIUM 9.4 8.2*  MG  --  1.8  PHOS  --  2.2*   Sepsis Markers No results found for this basename: LATICACIDVEN, PROCALCITON, O2SATVEN,  in the last 168 hours ABG No results found for this basename: PHART, PCO2ART, PO2ART,  in the last 168 hours Liver Enzymes  Recent Labs Lab 04/19/14 1315  AST 23  ALT 18  ALKPHOS 64  BILITOT 0.7  ALBUMIN 4.2   Cardiac Enzymes  Recent Labs Lab 04/19/14 1315  TROPONINI <0.30   Glucose No results found for this basename: GLUCAP,  in the last 168 hours  IMAGING:   Ct Head Wo Contrast  04/20/2014   CLINICAL DATA:  Followup subdural hematoma.  EXAM: CT HEAD WITHOUT CONTRAST  TECHNIQUE: Contiguous axial images were obtained from the base of the skull through the vertex without intravenous contrast.  COMPARISON:  CT HEAD W/O CM dated 04/19/2014  FINDINGS: Status post interval right frontotemporal craniotomy for evacuation of subdural hematoma  with extra-axial drain in place via a right parietal burr hole. Near complete evacuation of the extra-axial fluid collection, with small to moderate amount of right extra-axial pneumocephalus. 3 mm residual right to left midline shift, previously 10 mm. Re-expanded right lateral ventricle, no hydrocephalus.  Patchy supratentorial white matter hypodensities again seen, with less conspicuous right cerebellar hypodensity, though prior examination was technically superior. No acute large vascular territory infarct. Basal cisterns are patent, very mild right uncal herniation.  Trace paranasal sinus mucosal thickening without air-fluid levels. Patient is  edentulous. Mastoid air cells are well aerated. Severe calcific atherosclerosis of the carotid siphons. Ocular globes and orbital contents are nonsuspicious.  Right posterior temporal lobe 28 x 22 mm evolving intraparenchymal hemorrhagic contusion with surrounding low-density vasogenic edema.  IMPRESSION: Interval right frontotemporal craniotomy for near complete evacuation of right subdural hematoma with or surgical drain in place. Minimal residual right to left midline shift, with re-expanded right lateral ventricle, no hydrocephalus.  Evolving right posterior temporal lobe hemorrhagic contusion.  Patchy white matter changes reflect chronic small vessel ischemic disease and/or, possible contusions, with less conspicuous right cerebellar hypodensity.   Electronically Signed   By: Elon Alas   On: 04/20/2014 04:59   Ct Head Wo Contrast  04/19/2014   CLINICAL DATA:  Choking sensation with fall and left forehead lacerations.  EXAM: CT HEAD WITHOUT CONTRAST  TECHNIQUE: Contiguous axial images were obtained from the base of the skull through the vertex without intravenous contrast.  COMPARISON:  None.  FINDINGS: There is a large acute hemispheric right subdural hematoma, measuring up to 2.0 cm in thickness. There is resulting midline shift by 1 cm. A small amount of blood tracks along the right tentorium. Posteriorly in the right temporal lobe is a probable adjacent focus of hemorrhagic contusion measuring up to 2.3 x 2.7 cm transverse on image 11.  Patchy periventricular white matter disease is present bilaterally. 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 No signs of acute stroke are demonstrated. There is no hydrocephalus or intraventricular hemorrhage.  The visualized paranasal sinuses, mastoid air cells and middle ears are clear. The calvarium is intact.  IMPRESSION: 1. Large right hemispheric subdural hematoma with associated midline shift. 2. Moderate size hemorrhagic contusion involving  the right temporal lobe posteriorly. 3. Underlying periventricular white matter disease. No signs of acute stroke or hydrocephalus. 4. Critical Value/emergent results were called by telephone at the time of interpretation on 04/19/2014 at 1:45 PM to Dr. Jola Schmidt, who verbally acknowledged these results.   Electronically Signed   By: Camie Patience M.D.   On: 04/19/2014 13:50   Dg Chest Port 1 View  04/19/2014   CLINICAL DATA:  Difficulty breathing  EXAM: PORTABLE CHEST - 1 VIEW  COMPARISON:  January 27, 2013  FINDINGS: There is mild interstitial edema. There is no airspace consolidation. Heart is enlarged with pulmonary vascularity within normal limits. Patient is status post median sternotomy. No adenopathy.  IMPRESSION: Suspect a degree of chronic congestive heart failure. There is slight generalized interstitial edema with cardiomegaly. There is no airspace consolidation.   Electronically Signed   By: Lowella Grip M.D.   On: 04/19/2014 16:15   ASSESSMENT / PLAN:  PULMONARY A:  No active issues P:   Supplemental oxygen PRN  CARDIOVASCULAR A:  HTN AF, controlled rate P:  Labetalol PRN D/c Cardene  RENAL A:   Hypokalemia Hypophosphatemia P:   NS@75  Trend BMP KPhos 15 x 1  GASTROINTESTINAL A:  Nutrition GI  Px P:   NPO SLP eval Protonix  HEMATOLOGIC A:   Coumadin induced coagulopathy, reversed VTE Px P:  Likely not a candidate for any further anticoagulation SCD  INFECTIOUS A:   No active issues P:   Prophylactic abx per Neurosurgery  ENDOCRINE A:   No active issues P:   No intervention required  NEUROLOGIC A:   SDH s/p evacuation ICH Alcohol abuse At risk for DT P:   Per Neurosurgery Thiamine/Folate CIWA / Ativan PRN Fentanyl PRN  I have personally obtained history, examined patient, evaluated and interpreted laboratory and imaging results, reviewed medical records, formulated assessment / plan and placed orders.  Doree Fudge, MD Pulmonary and [facility] Pager: [phone]  04/20/2014, 8:53 AM

## 2014-04-20 NOTE — Progress Notes (Signed)
eLink Physician-Brief Progress Note Patient Name: ELAND LAMANTIA DOB: June 11, 1946 MRN: [id number]  Date of Service  04/20/2014   HPI/Events of Note  Patient with h/o of daily ETOH consumption now with agitation, concern for ETOH withdrawal, s/p crani.  Has been receiving prn fentanyl along with lorazepam prn per CIWA protocol.  Nurse is concerned about resp status.  Patient is HD stable, tachy with HR in the 120s with RR of 16 and sats in the low 90s.  Camera check shows patient agitated, moving in bed.   Nurses at bedside.   eICU Interventions  Plan: Start precedex for ETOH withdrawal. D/C benzo Continue fentanyl but at lower dose Will have PCCM evaluate at bedside.   Intervention Category Major Interventions: Delirium, psychosis, severe agitation - evaluation and management  Guadelupe Sabin Deterding 04/20/2014, 11:50 PM

## 2014-04-20 NOTE — Progress Notes (Signed)
Physical Therapy Evaluation Patient Details Name: Alejandro [name] MRN: [id number] DOB: Apr 27, 1946 Today's Date: 04/20/2014   History of Present Illness  68 yo with AF on Coumadin admitted after fall.  Head CT revealed SDH with midline shift, and hemorrhagic contusion of the right temporal lobe. Underwent craniotomy and hematoma evacuation on 5/18.  Clinical Impression  Pt admitted with above. Pt with noted generalized weakness, impaired balance, and L sided neglect. Pt now requires assist for all mobility. Pt to benefit from CIR upon d/c to achieve maximal functional recovery for safe transition home with wife.     Follow Up Recommendations CIR    Equipment Recommendations  None recommended by PT    Recommendations for Other Services Rehab consult     Precautions / Restrictions Precautions Precautions: Fall Restrictions Weight Bearing Restrictions: No      Mobility  Bed Mobility Overal bed mobility: Needs Assistance Bed Mobility: Supine to Sit     Supine to sit: Min assist;HOB elevated     General bed mobility comments: v/c's for sequencing, minA for trunk elevation. pt able to move LEs to EOB, used bedrails to assist  Transfers Overall transfer level: Needs assistance Equipment used: Rolling walker (2 wheeled) Transfers: Sit to/from Stand Sit to Stand: Mod assist         General transfer comment: modA to achieve full upright standing, tactile cues to initiate anterior weight shift  Ambulation/Gait Ambulation/Gait assistance: Mod assist;+2 safety/equipment Ambulation Distance (Feet): 25 Feet Assistive device: Rolling walker (2 wheeled) Gait Pattern/deviations: Step-through pattern;Decreased stride length;Shuffle;Narrow base of support;Trunk flexed;Drifts right/left Gait velocity: slow   General Gait Details: pt with apparent L sided neglect/inattention requiring max v/c's to manage walker around obstacles/doorway. Pt with onset of fatigue  Stairs             Wheelchair Mobility    Modified Rankin (Stroke Patients Only)       Balance Overall balance assessment: Needs assistance Sitting-balance support: Feet supported;Single extremity supported Sitting balance-Leahy Scale: Poor     Standing balance support: Bilateral upper extremity supported Standing balance-Leahy Scale: Poor Standing balance comment: requires RW or bilat uE support                             Pertinent Vitals/Pain 8/10 headache    Home Living Family/patient expects to be discharged to:: Inpatient rehab Living Arrangements: Spouse/significant other               Additional Comments: pt was indep and worked full time    Prior Function Level of Independence: Independent         Comments: used cane or walker intermittently     Hand Dominance   Dominant Hand: Right    Extremity/Trunk Assessment   Upper Extremity Assessment: Generalized weakness           Lower Extremity Assessment: Generalized weakness      Cervical / Trunk Assessment: Normal  Communication   Communication: No difficulties  Cognition Arousal/Alertness: Awake/alert (sleepy from fentyl dose for 8/10 headache) Behavior During Therapy: WFL for tasks assessed/performed;Flat affect Overall Cognitive Status: Impaired/Different from baseline Area of Impairment: Orientation;Problem solving Orientation Level:  (relies on calander for date)           Problem Solving: Slow processing;Requires verbal cues;Requires tactile cues General Comments: pt appears to have mild L sided neglect/inattention    General Comments      Exercises  Assessment/Plan    PT Assessment Patient needs continued PT services  PT Diagnosis Difficulty walking   PT Problem List Decreased strength;Decreased activity tolerance;Decreased balance;Decreased mobility;Decreased knowledge of use of DME;Decreased safety awareness  PT Treatment Interventions DME instruction;Gait  training;Functional mobility training;Therapeutic activities;Therapeutic exercise;Balance training;Neuromuscular re-education   PT Goals (Current goals can be found in the Care Plan section) Acute Rehab PT Goals Patient Stated Goal: to get better PT Goal Formulation: With patient Time For Goal Achievement: 05/04/14 Potential to Achieve Goals: Good    Frequency Min 4X/week   Barriers to discharge        Co-evaluation               End of Session Equipment Utilized During Treatment: Gait belt Activity Tolerance: Patient limited by fatigue Patient left: in chair;with call bell/phone within reach;with family/visitor present Nurse Communication: Mobility status         Time: 0900-0920 PT Time Calculation (min): 20 min   Charges:   PT Evaluation $Initial PT Evaluation Tier I: 1 Procedure PT Treatments $Gait Training: 8-22 mins   PT G Codes:          Robbi Scurlock M Rourke Mcquitty 04/20/2014, 11:00 AM  Kittie Plater, PT, DPT Pager #: [phone] Office #: [phone]

## 2014-04-20 NOTE — Progress Notes (Signed)
UR completed.  Brecklynn Jian, RN BSN [facility] CCM Trauma/Neuro ICU Case Manager [phone]  

## 2014-04-20 NOTE — Progress Notes (Signed)
Subjective: Patient reports Patient doing well minimal headache no nausea  Objective: Vital signs in last 24 hours: Temp:  [97.4 F (36.3 C)-97.8 F (36.6 C)] 97.6 F (36.4 C) (05/19 0332) Pulse Rate:  [60-112] 82 (05/19 0600) Resp:  [12-26] 19 (05/19 0600) BP: (122-212)/(49-110) 134/57 mmHg (05/19 0600) SpO2:  [87 %-99 %] 98 % (05/19 0600) Arterial Line BP: (129-219)/(42-98) 133/48 mmHg (05/19 0600) Weight:  [84.823 kg (187 lb)-109.1 kg (240 lb 8.4 oz)] 108.6 kg (239 lb 6.7 oz) (05/19 0338)  Intake/Output from previous day: 05/18 0701 - 05/19 0700 In: 3379.4 [I.V.:3224.4; IV Piggyback:155] Out: 0737 [Urine:2100; Drains:290; Blood:100] Intake/Output this shift: Total I/O In: 2364.4 [I.V.:2209.4; IV Piggyback:155] Out: 1190 [Urine:900; Drains:290]  Awake alert oriented x4 strength is 5 out of 5 with no pronator drift bandage clean dry and intact  Lab Results:  Recent Labs  04/19/14 1315 04/20/14 0500  WBC 14.1* 13.6*  HGB 14.2 12.0*  HCT 40.0 34.3*  PLT 237 210   BMET  Recent Labs  04/19/14 1315 04/20/14 0500  NA 132* 136*  K 4.1 3.8  CL 94* 102  CO2 22 21  GLUCOSE 134* 153*  BUN 10 14  CREATININE 1.09 0.99  CALCIUM 9.4 8.2*    Studies/Results: Ct Head Wo Contrast  04/20/2014   CLINICAL DATA:  Followup subdural hematoma.  EXAM: CT HEAD WITHOUT CONTRAST  TECHNIQUE: Contiguous axial images were obtained from the base of the skull through the vertex without intravenous contrast.  COMPARISON:  CT HEAD W/O CM dated 04/19/2014  FINDINGS: Status post interval right frontotemporal craniotomy for evacuation of subdural hematoma with extra-axial drain in place via a right parietal burr hole. Near complete evacuation of the extra-axial fluid collection, with small to moderate amount of right extra-axial pneumocephalus. 3 mm residual right to left midline shift, previously 10 mm. Re-expanded right lateral ventricle, no hydrocephalus.  Patchy supratentorial white matter  hypodensities again seen, with less conspicuous right cerebellar hypodensity, though prior examination was technically superior. No acute large vascular territory infarct. Basal cisterns are patent, very mild right uncal herniation.  Trace paranasal sinus mucosal thickening without air-fluid levels. Patient is edentulous. Mastoid air cells are well aerated. Severe calcific atherosclerosis of the carotid siphons. Ocular globes and orbital contents are nonsuspicious.  Right posterior temporal lobe 28 x 22 mm evolving intraparenchymal hemorrhagic contusion with surrounding low-density vasogenic edema.  IMPRESSION: Interval right frontotemporal craniotomy for near complete evacuation of right subdural hematoma with or surgical drain in place. Minimal residual right to left midline shift, with re-expanded right lateral ventricle, no hydrocephalus.  Evolving right posterior temporal lobe hemorrhagic contusion.  Patchy white matter changes reflect chronic small vessel ischemic disease and/or, possible contusions, with less conspicuous right cerebellar hypodensity.   Electronically Signed   By: Elon Alas   On: 04/20/2014 04:59   Ct Head Wo Contrast  04/19/2014   CLINICAL DATA:  Choking sensation with fall and left forehead lacerations.  EXAM: CT HEAD WITHOUT CONTRAST  TECHNIQUE: Contiguous axial images were obtained from the base of the skull through the vertex without intravenous contrast.  COMPARISON:  None.  FINDINGS: There is a large acute hemispheric right subdural hematoma, measuring up to 2.0 cm in thickness. There is resulting midline shift by 1 cm. A small amount of blood tracks along the right tentorium. Posteriorly in the right temporal lobe is a probable adjacent focus of hemorrhagic contusion measuring up to 2.3 x 2.7 cm transverse on image 11.  Patchy periventricular white  matter disease is present bilaterally. 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 No signs of acute stroke are  demonstrated. There is no hydrocephalus or intraventricular hemorrhage.  The visualized paranasal sinuses, mastoid air cells and middle ears are clear. The calvarium is intact.  IMPRESSION: 1. Large right hemispheric subdural hematoma with associated midline shift. 2. Moderate size hemorrhagic contusion involving the right temporal lobe posteriorly. 3. Underlying periventricular white matter disease. No signs of acute stroke or hydrocephalus. 4. Critical Value/emergent results were called by telephone at the time of interpretation on 04/19/2014 at 1:45 PM to Dr. Jola Schmidt, who verbally acknowledged these results.   Electronically Signed   By: Camie Patience M.D.   On: 04/19/2014 13:50   Dg Chest Port 1 View  04/19/2014   CLINICAL DATA:  Difficulty breathing  EXAM: PORTABLE CHEST - 1 VIEW  COMPARISON:  January 27, 2013  FINDINGS: There is mild interstitial edema. There is no airspace consolidation. Heart is enlarged with pulmonary vascularity within normal limits. Patient is status post median sternotomy. No adenopathy.  IMPRESSION: Suspect a degree of chronic congestive heart failure. There is slight generalized interstitial edema with cardiomegaly. There is no airspace consolidation.   Electronically Signed   By: Lowella Grip M.D.   On: 04/19/2014 16:15    Assessment/Plan: Progressive mobilization today with physical occupational therapy DC his a-line advance his diet, which possibly DC his Foley later on this morning if the patient shows that he could manage it independently  LOS: 1 day     [name] 04/20/2014, 6:53 AM

## 2014-04-21 LAB — CBC
HCT: 35.7 % — ABNORMAL LOW (ref 39.0–52.0)
Hemoglobin: 12.2 g/dL — ABNORMAL LOW (ref 13.0–17.0)
MCH: 30.7 pg (ref 26.0–34.0)
MCHC: 34.2 g/dL (ref 30.0–36.0)
MCV: 89.9 fL (ref 78.0–100.0)
Platelets: 168 10*3/uL (ref 150–400)
RBC: 3.97 MIL/uL — ABNORMAL LOW (ref 4.22–5.81)
RDW: 13.8 % (ref 11.5–15.5)
WBC: 16.2 10*3/uL — ABNORMAL HIGH (ref 4.0–10.5)

## 2014-04-21 LAB — BASIC METABOLIC PANEL
BUN: 13 mg/dL (ref 6–23)
CHLORIDE: 101 meq/L (ref 96–112)
CO2: 22 mEq/L (ref 19–32)
Calcium: 8.8 mg/dL (ref 8.4–10.5)
Creatinine, Ser: 0.95 mg/dL (ref 0.50–1.35)
GFR calc Af Amer: >90 mL/min (ref >90)
GFR calc non Af Amer: 84 mL/min — ABNORMAL LOW (ref >90)
Glucose, Bld: 129 mg/dL — ABNORMAL HIGH (ref 70–99)
POTASSIUM: 4.3 meq/L (ref 3.7–5.3)
SODIUM: 135 meq/L — AB (ref 137–147)

## 2014-04-21 LAB — PROTIME-INR
INR: 1.02 (ref 0.00–1.49)
PROTHROMBIN TIME: 13.2 s (ref 11.6–15.2)

## 2014-04-21 MED ORDER — SIMVASTATIN 40 MG PO TABS
40.0000 mg | ORAL_TABLET | Freq: Every day | ORAL | Status: DC
  Filled 2014-04-21: qty 1

## 2014-04-21 MED ORDER — FOLIC ACID 1 MG PO TABS
1.0000 mg | ORAL_TABLET | Freq: Every day | ORAL | Status: DC
  Administered 2014-04-22 – 2014-05-04 (×13): 1 mg via ORAL
  Filled 2014-04-21 (×13): qty 1

## 2014-04-21 MED ORDER — ATORVASTATIN CALCIUM 20 MG PO TABS
20.0000 mg | ORAL_TABLET | Freq: Every day | ORAL | Status: DC
  Administered 2014-04-22: 20 mg via ORAL
  Filled 2014-04-21 (×4): qty 1

## 2014-04-21 MED ORDER — DEXMEDETOMIDINE HCL IN NACL 200 MCG/50ML IV SOLN
0.4000 ug/kg/h | INTRAVENOUS | Status: DC
  Administered 2014-04-21: 0.6 ug/kg/h via INTRAVENOUS
  Administered 2014-04-21: 1 ug/kg/h via INTRAVENOUS
  Administered 2014-04-21: 1.2 ug/kg/h via INTRAVENOUS
  Administered 2014-04-21 (×2): 1 ug/kg/h via INTRAVENOUS
  Administered 2014-04-21: 0.4 ug/kg/h via INTRAVENOUS
  Administered 2014-04-22: 1 ug/kg/h via INTRAVENOUS
  Administered 2014-04-22 (×3): 1.1 ug/kg/h via INTRAVENOUS
  Filled 2014-04-21 (×10): qty 50

## 2014-04-21 MED ORDER — LISINOPRIL 10 MG PO TABS
10.0000 mg | ORAL_TABLET | Freq: Every day | ORAL | Status: DC
  Administered 2014-04-22 – 2014-04-23 (×2): 10 mg via ORAL
  Filled 2014-04-21 (×4): qty 1

## 2014-04-21 MED ORDER — DILTIAZEM LOAD VIA INFUSION
15.0000 mg | Freq: Once | INTRAVENOUS | Status: DC
  Filled 2014-04-21: qty 15

## 2014-04-21 MED ORDER — VITAMIN B-1 100 MG PO TABS
100.0000 mg | ORAL_TABLET | Freq: Every day | ORAL | Status: DC
  Administered 2014-04-22 – 2014-05-04 (×13): 100 mg via ORAL
  Filled 2014-04-21 (×14): qty 1

## 2014-04-21 MED ORDER — DILTIAZEM HCL 25 MG/5ML IV SOLN
5.0000 mg | Freq: Once | INTRAVENOUS | Status: AC
  Administered 2014-04-21: 5 mg via INTRAVENOUS
  Filled 2014-04-21: qty 5

## 2014-04-21 MED ORDER — DILTIAZEM HCL 100 MG IV SOLR
5.0000 mg/h | INTRAVENOUS | Status: DC
  Filled 2014-04-21: qty 100

## 2014-04-21 MED ORDER — CARVEDILOL 12.5 MG PO TABS
12.5000 mg | ORAL_TABLET | Freq: Two times a day (BID) | ORAL | Status: DC
  Administered 2014-04-22 (×2): 12.5 mg via ORAL
  Filled 2014-04-21 (×6): qty 1

## 2014-04-21 MED ORDER — LORAZEPAM 2 MG/ML IJ SOLN
1.0000 mg | INTRAMUSCULAR | Status: DC | PRN
  Administered 2014-04-21: 1 mg via INTRAVENOUS
  Administered 2014-04-21: 4 mg via INTRAVENOUS
  Administered 2014-04-22 (×4): 2 mg via INTRAVENOUS
  Filled 2014-04-21 (×2): qty 1
  Filled 2014-04-21: qty 2
  Filled 2014-04-21 (×3): qty 1

## 2014-04-21 NOTE — Progress Notes (Signed)
PT Cancellation Note  Patient Details Name: Alejandro [name] MRN: [id number] DOB: May 25, 1946   Cancelled Treatment:    Reason Eval/Treat Not Completed: Medical issues which prohibited therapy (will Hold, per nsg (possible DT))   Duncan Dull 04/21/2014, 1:46 PM Alben Deeds, [place] DPT  [phone]

## 2014-04-21 NOTE — Clinical Documentation Improvement (Signed)
Abnormal diagnostic findings (MRI scans, CT scans, etc.) are not coded and reported unless the physician indicates their clinical significance. If possible, please help by clarifying the diagnostic and/or clinical significance of the abnormal diagnostic study for this patient.   Cerebral Edema   Vasogenic Edema   Other brain herniation (please specify)    Other cause   Risk Factors: fall with injury to head; coumadin therapy Diagnostics: Right posterior temporal lobe 28 x 22 mm evolving intraparenchymal hemorrhagic contusion with surrounding low-density vasogenic edema.  Treatment:Craniotomy, hematoma evacuation  Thank you,  Joya Salm, RN Clinical Documentation Specialist:  [place] Information Management

## 2014-04-21 NOTE — Progress Notes (Signed)
PULMONARY / CRITICAL CARE MEDICINE   Name: Alejandro [name] MRN: [id number] DOB: Jan 08, 1946    ADMISSION DATE:  04/19/2014 CONSULTATION DATE:  04/19/2014  REFERRING MD :  EDP PRIMARY SERVICE: PCCM  CHIEF COMPLAINT:  VDRF post-op crani for SDH  BRIEF PATIENT DESCRIPTION: 68 yo with AF on Coumadin admitted after fall.  Head CT revealed SDH with midline shift, and hemorrhagic contusion of the right temporal lobe. Underwent craniotomy and hematoma evacuation.  SIGNIFICANT EVENTS / STUDIES:  5/18  Head CT >>> SDH with midline shift, and hemorrhagic contusion of the right temporal lobe 5/18  OR >>> Craniotomy, hematoma evacuation  LINES / TUBES:  CULTURES:  ANTIBIOTICS: Cefazolin 5/19 >>>  INTERVAL HISTORY:  Agitation >>> Precedex gtt started but now off.  Cranial drain removed.  VITAL SIGNS: Temp:  [97.4 F (36.3 C)-98.9 F (37.2 C)] 98.1 F (36.7 C) (05/20 0800) Pulse Rate:  [76-142] 96 (05/20 0800) Resp:  [14-36] 22 (05/20 0800) BP: (129-173)/(56-110) 158/70 mmHg (05/20 0800) SpO2:  [90 %-100 %] 100 % (05/20 0800) Weight:  [108 kg (238 lb 1.6 oz)] 108 kg (238 lb 1.6 oz) (05/20 0500)  HEMODYNAMICS:   VENTILATOR SETTINGS:   INTAKE / OUTPUT: Intake/Output     05/19 0701 - 05/20 0700 05/20 0701 - 05/21 0700   P.O. 240    I.V. (mL/kg) 1756.5 (16.3) 75 (0.7)   IV Piggyback 515    Total Intake(mL/kg) 2511.5 (23.3) 75 (0.7)   Urine (mL/kg/hr) 2500 (1)    Drains 225 (0.1)    Blood     Total Output 2725     Net -213.5 +75         PHYSICAL EXAMINATION: General: No distress / agitation Neuro: Asleep, but wakes up HEENT: Surgical dressing intact Cardiovascular:  Irregular, no murmurs Lungs:  Fer rhonchi Abdomen:  Soft, bowel sounds present Musculoskeletal:  Trace edema, L knee laceration. Skin:  No rash  LABS:  CBC  Recent Labs Lab 04/19/14 1315 04/20/14 0500 04/21/14 0319  WBC 14.1* 13.6* 16.2*  HGB 14.2 12.0* 12.2*  HCT 40.0 34.3* 35.7*  PLT 237 210 168    Coag's  Recent Labs Lab 04/19/14 1528 04/20/14 0510 04/21/14 0319  INR 1.13 1.25 1.02   BMET  Recent Labs Lab 04/19/14 1315 04/20/14 0500 04/21/14 0319  NA 132* 136* 135*  K 4.1 3.8 4.3  CL 94* 102 101  CO2 22 21 22   BUN 10 14 13   CREATININE 1.09 0.99 0.95  GLUCOSE 134* 153* 129*   Electrolytes  Recent Labs Lab 04/19/14 1315 04/20/14 0500 04/21/14 0319  CALCIUM 9.4 8.2* 8.8  MG  --  1.8  --   PHOS  --  2.2*  --    Sepsis Markers No results found for this basename: LATICACIDVEN, PROCALCITON, O2SATVEN,  in the last 168 hours ABG No results found for this basename: PHART, PCO2ART, PO2ART,  in the last 168 hours Liver Enzymes  Recent Labs Lab 04/19/14 1315  AST 23  ALT 18  ALKPHOS 64  BILITOT 0.7  ALBUMIN 4.2   Cardiac Enzymes  Recent Labs Lab 04/19/14 1315  TROPONINI <0.30   Glucose No results found for this basename: GLUCAP,  in the last 168 hours  IMAGING:   Ct Head Wo Contrast  04/20/2014   CLINICAL DATA:  Followup subdural hematoma.  EXAM: CT HEAD WITHOUT CONTRAST  TECHNIQUE: Contiguous axial images were obtained from the base of the skull through the vertex without intravenous contrast.  COMPARISON:  CT HEAD W/O CM dated 04/19/2014  FINDINGS: Status post interval right frontotemporal craniotomy for evacuation of subdural hematoma with extra-axial drain in place via a right parietal burr hole. Near complete evacuation of the extra-axial fluid collection, with small to moderate amount of right extra-axial pneumocephalus. 3 mm residual right to left midline shift, previously 10 mm. Re-expanded right lateral ventricle, no hydrocephalus.  Patchy supratentorial white matter hypodensities again seen, with less conspicuous right cerebellar hypodensity, though prior examination was technically superior. No acute large vascular territory infarct. Basal cisterns are patent, very mild right uncal herniation.  Trace paranasal sinus mucosal thickening without  air-fluid levels. Patient is edentulous. Mastoid air cells are well aerated. Severe calcific atherosclerosis of the carotid siphons. Ocular globes and orbital contents are nonsuspicious.  Right posterior temporal lobe 28 x 22 mm evolving intraparenchymal hemorrhagic contusion with surrounding low-density vasogenic edema.  IMPRESSION: Interval right frontotemporal craniotomy for near complete evacuation of right subdural hematoma with or surgical drain in place. Minimal residual right to left midline shift, with re-expanded right lateral ventricle, no hydrocephalus.  Evolving right posterior temporal lobe hemorrhagic contusion.  Patchy white matter changes reflect chronic small vessel ischemic disease and/or, possible contusions, with less conspicuous right cerebellar hypodensity.   Electronically Signed   By: Elon Alas   On: 04/20/2014 04:59   Ct Head Wo Contrast  04/19/2014   CLINICAL DATA:  Choking sensation with fall and left forehead lacerations.  EXAM: CT HEAD WITHOUT CONTRAST  TECHNIQUE: Contiguous axial images were obtained from the base of the skull through the vertex without intravenous contrast.  COMPARISON:  None.  FINDINGS: There is a large acute hemispheric right subdural hematoma, measuring up to 2.0 cm in thickness. There is resulting midline shift by 1 cm. A small amount of blood tracks along the right tentorium. Posteriorly in the right temporal lobe is a probable adjacent focus of hemorrhagic contusion measuring up to 2.3 x 2.7 cm transverse on image 11.  Patchy periventricular white matter disease is present bilaterally. 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 No signs of acute stroke are demonstrated. There is no hydrocephalus or intraventricular hemorrhage.  The visualized paranasal sinuses, mastoid air cells and middle ears are clear. The calvarium is intact.  IMPRESSION: 1. Large right hemispheric subdural hematoma with associated midline shift. 2. Moderate size  hemorrhagic contusion involving the right temporal lobe posteriorly. 3. Underlying periventricular white matter disease. No signs of acute stroke or hydrocephalus. 4. Critical Value/emergent results were called by telephone at the time of interpretation on 04/19/2014 at 1:45 PM to Dr. Jola Schmidt, who verbally acknowledged these results.   Electronically Signed   By: Camie Patience M.D.   On: 04/19/2014 13:50   Dg Chest Port 1 View  04/19/2014   CLINICAL DATA:  Difficulty breathing  EXAM: PORTABLE CHEST - 1 VIEW  COMPARISON:  January 27, 2013  FINDINGS: There is mild interstitial edema. There is no airspace consolidation. Heart is enlarged with pulmonary vascularity within normal limits. Patient is status post median sternotomy. No adenopathy.  IMPRESSION: Suspect a degree of chronic congestive heart failure. There is slight generalized interstitial edema with cardiomegaly. There is no airspace consolidation.   Electronically Signed   By: Lowella Grip M.D.   On: 04/19/2014 16:15   ASSESSMENT / PLAN:  PULMONARY A:  No active issues P:   Supplemental oxygen PRN  CARDIOVASCULAR A:  HTN AF, controlled rate P:  Labetalol PRN Restart preadmission Coreg / Lisinopril 1/2  dose Restart Zocor  RENAL A:   No active issues P:   NS@75  Trend BMP  GASTROINTESTINAL A:  Nutrition GI Px is not indicated P:   Diet D/c Protonix  HEMATOLOGIC A:   Coumadin induced coagulopathy, reversed VTE Px P:  Likely not a candidate for any further anticoagulation SCD  INFECTIOUS A:   No active issues P:   Prophylactic abx per Neurosurgery  ENDOCRINE A:   No active issues P:   No intervention required  NEUROLOGIC A:   SDH s/p evacuation ICH Alcohol abuse At risk for DT P:   Per Neurosurgery Thiamine/Folate CIWA / Ativan PRN Fentanyl PRN D/c Precedex  I have personally obtained history, examined patient, evaluated and interpreted laboratory and imaging results, reviewed medical  records, formulated assessment / plan and placed orders.  Doree Fudge, MD Pulmonary and [place] Pager: [phone]  04/21/2014, 9:12 AM

## 2014-04-21 NOTE — Progress Notes (Signed)
Patient has become very agitated and restless over the past hour. CIWA has increased from a 10 to a 22. 2mg  of Ativan and 158mcg of fentanyl given without any improvement. Patient is pulling out lines. MD notified. New orders received. [place]

## 2014-04-21 NOTE — Progress Notes (Signed)
Subjective: Patient reports No complaints this morning minimal headache  Objective: Vital signs in last 24 hours: Temp:  [97.4 F (36.3 C)-98.9 F (37.2 C)] 98.1 F (36.7 C) (05/20 0800) Pulse Rate:  [61-142] 96 (05/20 0800) Resp:  [14-36] 22 (05/20 0800) BP: (129-173)/(56-110) 158/70 mmHg (05/20 0800) SpO2:  [90 %-100 %] 100 % (05/20 0800) Weight:  [108 kg (238 lb 1.6 oz)] 108 kg (238 lb 1.6 oz) (05/20 0500)  Intake/Output from previous day: 05/19 0701 - 05/20 0700 In: 2511.5 [P.O.:240; I.V.:1756.5; IV Piggyback:515] Out: 2725 [Urine:2500; Drains:225] Intake/Output this shift: Total I/O In: 75 [I.V.:75] Out: -   Somnolent difficult to arouse secondary to sedation on precedex however does move all extremities well with equal strength pupils are equal wound clean and dry  Lab Results:  Recent Labs  04/20/14 0500 04/21/14 0319  WBC 13.6* 16.2*  HGB 12.0* 12.2*  HCT 34.3* 35.7*  PLT 210 168   BMET  Recent Labs  04/20/14 0500 04/21/14 0319  NA 136* 135*  K 3.8 4.3  CL 102 101  CO2 21 22  GLUCOSE 153* 129*  BUN 14 13  CREATININE 0.99 0.95  CALCIUM 8.2* 8.8    Studies/Results: Ct Head Wo Contrast  04/20/2014   CLINICAL DATA:  Followup subdural hematoma.  EXAM: CT HEAD WITHOUT CONTRAST  TECHNIQUE: Contiguous axial images were obtained from the base of the skull through the vertex without intravenous contrast.  COMPARISON:  CT HEAD W/O CM dated 04/19/2014  FINDINGS: Status post interval right frontotemporal craniotomy for evacuation of subdural hematoma with extra-axial drain in place via a right parietal burr hole. Near complete evacuation of the extra-axial fluid collection, with small to moderate amount of right extra-axial pneumocephalus. 3 mm residual right to left midline shift, previously 10 mm. Re-expanded right lateral ventricle, no hydrocephalus.  Patchy supratentorial white matter hypodensities again seen, with less conspicuous right cerebellar hypodensity,  though prior examination was technically superior. No acute large vascular territory infarct. Basal cisterns are patent, very mild right uncal herniation.  Trace paranasal sinus mucosal thickening without air-fluid levels. Patient is edentulous. Mastoid air cells are well aerated. Severe calcific atherosclerosis of the carotid siphons. Ocular globes and orbital contents are nonsuspicious.  Right posterior temporal lobe 28 x 22 mm evolving intraparenchymal hemorrhagic contusion with surrounding low-density vasogenic edema.  IMPRESSION: Interval right frontotemporal craniotomy for near complete evacuation of right subdural hematoma with or surgical drain in place. Minimal residual right to left midline shift, with re-expanded right lateral ventricle, no hydrocephalus.  Evolving right posterior temporal lobe hemorrhagic contusion.  Patchy white matter changes reflect chronic small vessel ischemic disease and/or, possible contusions, with less conspicuous right cerebellar hypodensity.   Electronically Signed   By: Elon Alas   On: 04/20/2014 04:59   Ct Head Wo Contrast  04/19/2014   CLINICAL DATA:  Choking sensation with fall and left forehead lacerations.  EXAM: CT HEAD WITHOUT CONTRAST  TECHNIQUE: Contiguous axial images were obtained from the base of the skull through the vertex without intravenous contrast.  COMPARISON:  None.  FINDINGS: There is a large acute hemispheric right subdural hematoma, measuring up to 2.0 cm in thickness. There is resulting midline shift by 1 cm. A small amount of blood tracks along the right tentorium. Posteriorly in the right temporal lobe is a probable adjacent focus of hemorrhagic contusion measuring up to 2.3 x 2.7 cm transverse on image 11.  Patchy periventricular white matter disease is present bilaterally. 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 No signs of acute stroke are demonstrated. There is no hydrocephalus or intraventricular hemorrhage.  The  visualized paranasal sinuses, mastoid air cells and middle ears are clear. The calvarium is intact.  IMPRESSION: 1. Large right hemispheric subdural hematoma with associated midline shift. 2. Moderate size hemorrhagic contusion involving the right temporal lobe posteriorly. 3. Underlying periventricular white matter disease. No signs of acute stroke or hydrocephalus. 4. Critical Value/emergent results were called by telephone at the time of interpretation on 04/19/2014 at 1:45 PM to Dr. Jola Schmidt, who verbally acknowledged these results.   Electronically Signed   By: Camie Patience M.D.   On: 04/19/2014 13:50   Dg Chest Port 1 View  04/19/2014   CLINICAL DATA:  Difficulty breathing  EXAM: PORTABLE CHEST - 1 VIEW  COMPARISON:  January 27, 2013  FINDINGS: There is mild interstitial edema. There is no airspace consolidation. Heart is enlarged with pulmonary vascularity within normal limits. Patient is status post median sternotomy. No adenopathy.  IMPRESSION: Suspect a degree of chronic congestive heart failure. There is slight generalized interstitial edema with cardiomegaly. There is no airspace consolidation.   Electronically Signed   By: Lowella Grip M.D.   On: 04/19/2014 16:15    Assessment/Plan: Continue alcohol withdrawal protocol per CCM progressive mobilization with physical and occupational therapy I discontinued his JP drain as it was predominately draining spinal fluid.  LOS: 2 days    [name] 04/21/2014, 8:16 AM

## 2014-04-22 ENCOUNTER — Inpatient Hospital Stay (HOSPITAL_COMMUNITY): Payer: BC Managed Care – PPO

## 2014-04-22 LAB — TYPE AND SCREEN
ABO/RH(D): O NEG
Antibody Screen: NEGATIVE
UNIT DIVISION: 0
UNIT DIVISION: 0
UNIT DIVISION: 0
Unit division: 0

## 2014-04-22 LAB — PROTIME-INR
INR: 1.21 (ref 0.00–1.49)
PROTHROMBIN TIME: 15 s (ref 11.6–15.2)

## 2014-04-22 MED ORDER — RISPERIDONE 1 MG/ML PO SOLN
1.0000 mg | Freq: Every day | ORAL | Status: DC
  Administered 2014-04-22: 1 mg via ORAL
  Filled 2014-04-22 (×2): qty 1

## 2014-04-22 MED ORDER — RISPERIDONE 1 MG/ML PO SOLN
0.5000 mg | Freq: Every morning | ORAL | Status: DC
  Administered 2014-04-23: 0.5 mg via ORAL
  Filled 2014-04-22 (×2): qty 0.5

## 2014-04-22 MED ORDER — FENTANYL CITRATE 0.05 MG/ML IJ SOLN
50.0000 ug | INTRAMUSCULAR | Status: DC | PRN
  Administered 2014-04-23 (×7): 75 ug via INTRAVENOUS
  Filled 2014-04-22 (×7): qty 2

## 2014-04-22 MED ORDER — METOPROLOL TARTRATE 1 MG/ML IV SOLN
5.0000 mg | INTRAVENOUS | Status: DC | PRN
  Administered 2014-04-23 (×2): 5 mg via INTRAVENOUS
  Filled 2014-04-22 (×2): qty 5

## 2014-04-22 MED ORDER — HALOPERIDOL LACTATE 5 MG/ML IJ SOLN
5.0000 mg | Freq: Once | INTRAMUSCULAR | Status: AC
  Administered 2014-04-22: 5 mg via INTRAMUSCULAR
  Filled 2014-04-22: qty 1

## 2014-04-22 MED ORDER — FENTANYL CITRATE 0.05 MG/ML IJ SOLN: 25.0000 ug | INTRAMUSCULAR | Status: DC | PRN

## 2014-04-22 MED ORDER — MIDAZOLAM HCL 2 MG/2ML IJ SOLN
2.0000 mg | Freq: Once | INTRAMUSCULAR | Status: AC
  Administered 2014-04-22: 2 mg via INTRAMUSCULAR
  Filled 2014-04-22: qty 2

## 2014-04-22 MED ORDER — DEXMEDETOMIDINE HCL IN NACL 400 MCG/100ML IV SOLN
0.4000 ug/kg/h | INTRAVENOUS | Status: DC
  Administered 2014-04-22: 1.1 ug/kg/h via INTRAVENOUS
  Filled 2014-04-22: qty 100

## 2014-04-22 MED ORDER — CHLORHEXIDINE GLUCONATE 0.12 % MT SOLN
15.0000 mL | Freq: Two times a day (BID) | OROMUCOSAL | Status: DC
  Administered 2014-04-22 – 2014-05-04 (×19): 15 mL via OROMUCOSAL
  Filled 2014-04-22 (×25): qty 15

## 2014-04-22 MED ORDER — BIOTENE DRY MOUTH MT LIQD
15.0000 mL | Freq: Two times a day (BID) | OROMUCOSAL | Status: DC
  Administered 2014-04-23 – 2014-05-04 (×19): 15 mL via OROMUCOSAL

## 2014-04-22 NOTE — Progress Notes (Signed)
eLink Physician-Brief Progress Note Patient Name: Alejandro [name] DOB: May 08, 1946 MRN: [id number]  Date of Service  04/22/2014   HPI/Events of Note   Still pulling at tubes and lines Pulled out IV  eICU Interventions  Haldol IM again, versed IM now Replace IV restraints   Intervention Category Major Interventions: Change in mental status - evaluation and management  Juanito Doom 04/22/2014, 4:38 PM

## 2014-04-22 NOTE — Progress Notes (Signed)
eLink Physician-Brief Progress Note Patient Name: Alejandro [name] DOB: 05/16/1946 MRN: [id number]  Date of Service  04/22/2014   HPI/Events of Note   Trying to climb out of bed   eICU Interventions  Continue CIWA/ativan Haldol now redirect   Intervention Category Intermediate Interventions: Change in mental status - evaluation and management  Juanito Doom 04/22/2014, 4:08 PM

## 2014-04-22 NOTE — Progress Notes (Signed)
PT Cancellation Note  Patient Details Name: Alejandro [name] MRN: [id number] DOB: [birth year]   Cancelled Treatment:    Reason Eval/Treat Not Completed: Medical issues which prohibited therapy (pt sedated will hold)   Duncan Dull 04/22/2014, 2:16 PM Alben Deeds, [place] DPT  [phone]

## 2014-04-22 NOTE — Progress Notes (Signed)
eLink Physician-Brief Progress Note Patient Name: Alejandro [name] DOB: 03-07-1946 MRN: [id number]  Date of Service  04/22/2014   HPI/Events of Note  Continued agitation on Risperdal, prn versed, and had received haldol last pm.  Now with AF/RVR with rates to 160s.  Hypertensive, tachypenic with sats ranging from the low 90s to 95.   eICU Interventions  Plan: Will try fentanyl as nurse reported this helped yesterday 75 mcg times one Lopressor IV for tachycardia May need to consider dilt gtt.   Intervention Category Major Interventions: Delirium, psychosis, severe agitation - evaluation and management Intermediate Interventions: Arrhythmia - evaluation and management  Guadelupe Sabin Deterding 04/22/2014, 11:58 PM

## 2014-04-22 NOTE — Progress Notes (Signed)
OT Cancellation Note  Patient Details Name: Alejandro [name] MRN: [id number] DOB: [birth year]   Cancelled Treatment:    Reason Eval/Treat Not Completed: Medical issues which prohibited therapy Pt sedated. Will attempt tomorrow. [place], [place]  [phone] 04/22/2014 04/22/2014, 2:05 PM

## 2014-04-22 NOTE — Progress Notes (Signed)
PULMONARY / CRITICAL CARE MEDICINE   Name: Alejandro [name] MRN: [id number] DOB: 1946-05-28    ADMISSION DATE:  04/19/2014 CONSULTATION DATE:  04/19/2014  REFERRING MD :  EDP PRIMARY SERVICE: PCCM  CHIEF COMPLAINT:  VDRF post-op crani for SDH  BRIEF PATIENT DESCRIPTION: 68 yo with AF on Coumadin admitted after fall.  Head CT revealed SDH with midline shift, and hemorrhagic contusion of the right temporal lobe. Underwent craniotomy and hematoma evacuation.  SIGNIFICANT EVENTS / STUDIES:  5/18  Head CT >>> SDH with midline shift, and hemorrhagic contusion of the right temporal lobe 5/18  OR >>> Craniotomy, hematoma evacuation  LINES / TUBES:  CULTURES:  ANTIBIOTICS: Cefazolin 5/19 >>> ???  INTERVAL HISTORY:  No overnight issues.  VITAL SIGNS: Temp:  [99.5 F (37.5 C)-100.2 F (37.9 C)] 99.8 F (37.7 C) (05/21 0400) Pulse Rate:  [34-176] 97 (05/21 0700) Resp:  [19-33] 27 (05/21 0700) BP: (110-176)/(65-100) 160/81 mmHg (05/21 0700) SpO2:  [87 %-98 %] 96 % (05/21 0700) Weight:  [107.7 kg (237 lb 7 oz)] 107.7 kg (237 lb 7 oz) (05/21 0500)  HEMODYNAMICS:   VENTILATOR SETTINGS:   INTAKE / OUTPUT: Intake/Output     05/20 0701 - 05/21 0700 05/21 0701 - 05/22 0700   P.O. 0    I.V. (mL/kg) 2205.2 (20.5)    IV Piggyback 210    Total Intake(mL/kg) 2415.2 (22.4)    Urine (mL/kg/hr) 2285 (0.9)    Drains     Total Output 2285     Net +130.2           PHYSICAL EXAMINATION: General: No distress Neuro: Unable to arouse, decreased gag / cough HEENT: Surgical dressing intact Cardiovascular:  Irregular, no murmurs Lungs:  Bilateral scattered rhonchi Abdomen:  Soft, bowel sounds present Musculoskeletal:  Trace edema, L knee laceration. Skin:  No rash  LABS:  CBC  Recent Labs Lab 04/19/14 1315 04/20/14 0500 04/21/14 0319  WBC 14.1* 13.6* 16.2*  HGB 14.2 12.0* 12.2*  HCT 40.0 34.3* 35.7*  PLT 237 210 168   Coag's  Recent Labs Lab 04/20/14 0510 04/21/14 0319  04/22/14 0300  INR 1.25 1.02 1.21   BMET  Recent Labs Lab 04/19/14 1315 04/20/14 0500 04/21/14 0319  NA 132* 136* 135*  K 4.1 3.8 4.3  CL 94* 102 101  CO2 22 21 22   BUN 10 14 13   CREATININE 1.09 0.99 0.95  GLUCOSE 134* 153* 129*   Electrolytes  Recent Labs Lab 04/19/14 1315 04/20/14 0500 04/21/14 0319  CALCIUM 9.4 8.2* 8.8  MG  --  1.8  --   PHOS  --  2.2*  --    Sepsis Markers No results found for this basename: LATICACIDVEN, PROCALCITON, O2SATVEN,  in the last 168 hours ABG No results found for this basename: PHART, PCO2ART, PO2ART,  in the last 168 hours Liver Enzymes  Recent Labs Lab 04/19/14 1315  AST 23  ALT 18  ALKPHOS 64  BILITOT 0.7  ALBUMIN 4.2   Cardiac Enzymes  Recent Labs Lab 04/19/14 1315  TROPONINI <0.30   Glucose No results found for this basename: GLUCAP,  in the last 168 hours  IMAGING:  No results found.  ASSESSMENT / PLAN:  PULMONARY A:  No active issues P:   Supplemental oxygen PRN  CARDIOVASCULAR A:  HTN AF, controlled rate P:  Labetalol PRN Preadmission Coreg / Lisinopril 1/2 dose / Zocor D/c Cardizem  RENAL A:   No active issues P:   NS@75  Trend BMP  GASTROINTESTINAL A:  Nutrition GI Px is not indicated P:   Place feeding tube  HEMATOLOGIC A:   Coumadin induced coagulopathy, reversed VTE Px P:  Likely not a candidate for any further anticoagulation SCD  INFECTIOUS A:   No active issues P:   No intervention required  ENDOCRINE A:   No active issues P:   No intervention required  NEUROLOGIC A:   SDH s/p evacuation ICH Alcohol abuse Delirium, alcoholic? P:   Per Neurosurgery Thiamine/Folate CIWA / Ativan PRN Start Risperdal D/c Fentanyl D/c Precedex  I have personally obtained history, examined patient, evaluated and interpreted laboratory and imaging results, reviewed medical records, formulated assessment / plan and placed orders.  Doree Fudge, MD Pulmonary  and [place] Pager: [phone]  04/22/2014, 9:46 AM

## 2014-04-22 NOTE — Progress Notes (Signed)
Subjective: Patient reports Heavily sedated for his alcohol overall with minimal exam  Objective: Vital signs in last 24 hours: Temp:  [98.1 F (36.7 C)-100.2 F (37.9 C)] 99.8 F (37.7 C) (05/21 0400) Pulse Rate:  [34-176] 98 (05/21 0600) Resp:  [19-33] 24 (05/21 0600) BP: (110-176)/(65-101) 156/81 mmHg (05/21 0600) SpO2:  [87 %-100 %] 95 % (05/21 0600) Weight:  [107.7 kg (237 lb 7 oz)] 107.7 kg (237 lb 7 oz) (05/21 0500)  Intake/Output from previous day: 05/20 0701 - 05/21 0700 In: 2310.5 [I.V.:2100.5; IV Piggyback:210] Out: 2285 [Urine:2285] Intake/Output this shift:    As above has recently received Ativan heavily sedated pupils are equal moves all extremities when not sedated symmetrically  Lab Results:  Recent Labs  04/20/14 0500 04/21/14 0319  WBC 13.6* 16.2*  HGB 12.0* 12.2*  HCT 34.3* 35.7*  PLT 210 168   BMET  Recent Labs  04/20/14 0500 04/21/14 0319  NA 136* 135*  K 3.8 4.3  CL 102 101  CO2 21 22  GLUCOSE 153* 129*  BUN 14 13  CREATININE 0.99 0.95  CALCIUM 8.2* 8.8    Studies/Results: No results found.  Assessment/Plan: Continue to manage alcohol withdrawal mobilize as able with physical outpatient therapy will more than likely need a rehabilitation consult  LOS: 3 days     [name] 04/22/2014, 7:47 AM

## 2014-04-23 ENCOUNTER — Inpatient Hospital Stay (HOSPITAL_COMMUNITY): Payer: BC Managed Care – PPO

## 2014-04-23 ENCOUNTER — Inpatient Hospital Stay (HOSPITAL_COMMUNITY): Payer: BC Managed Care – PPO | Admitting: Certified Registered"

## 2014-04-23 ENCOUNTER — Encounter (HOSPITAL_COMMUNITY): Payer: BC Managed Care – PPO | Admitting: Certified Registered"

## 2014-04-23 ENCOUNTER — Encounter (HOSPITAL_COMMUNITY)
Admission: EM | Disposition: A | Discharge status: Rehabilitation Facility | Payer: Self-pay | Source: Home / Self Care | Attending: Pulmonary Disease

## 2014-04-23 DIAGNOSIS — R404 Transient alteration of awareness: Secondary | ICD-10-CM

## 2014-04-23 DIAGNOSIS — F1010 Alcohol abuse, uncomplicated: Secondary | ICD-10-CM

## 2014-04-23 DIAGNOSIS — R410 Disorientation, unspecified: Secondary | ICD-10-CM

## 2014-04-23 DIAGNOSIS — F10239 Alcohol dependence with withdrawal, unspecified: Secondary | ICD-10-CM

## 2014-04-23 DIAGNOSIS — F10939 Alcohol use, unspecified with withdrawal, unspecified: Secondary | ICD-10-CM

## 2014-04-23 HISTORY — PX: CRANIOTOMY: SHX93

## 2014-04-23 LAB — BASIC METABOLIC PANEL
BUN: 19 mg/dL (ref 6–23)
CALCIUM: 8.7 mg/dL (ref 8.4–10.5)
CO2: 21 mEq/L (ref 19–32)
Chloride: 103 mEq/L (ref 96–112)
Creatinine, Ser: 0.91 mg/dL (ref 0.50–1.35)
GFR calc Af Amer: >90 mL/min (ref >90)
GFR, EST NON AFRICAN AMERICAN: 85 mL/min — AB (ref >90)
Glucose, Bld: 117 mg/dL — ABNORMAL HIGH (ref 70–99)
POTASSIUM: 3.2 meq/L — AB (ref 3.7–5.3)
SODIUM: 140 meq/L (ref 137–147)

## 2014-04-23 LAB — CBC
HCT: 29.3 % — ABNORMAL LOW (ref 39.0–52.0)
Hemoglobin: 10 g/dL — ABNORMAL LOW (ref 13.0–17.0)
MCH: 30.4 pg (ref 26.0–34.0)
MCHC: 34.1 g/dL (ref 30.0–36.0)
MCV: 89.1 fL (ref 78.0–100.0)
Platelets: 174 10*3/uL (ref 150–400)
RBC: 3.29 MIL/uL — ABNORMAL LOW (ref 4.22–5.81)
RDW: 13.6 % (ref 11.5–15.5)
WBC: 10.8 10*3/uL — ABNORMAL HIGH (ref 4.0–10.5)

## 2014-04-23 LAB — GLUCOSE, CAPILLARY: GLUCOSE-CAPILLARY: 109 mg/dL — AB (ref 70–99)

## 2014-04-23 SURGERY — CRANIOTOMY HEMATOMA EVACUATION SUBDURAL
Anesthesia: General | Site: Head | Laterality: Right

## 2014-04-23 MED ORDER — OXYCODONE HCL 5 MG/5ML PO SOLN: 5.0000 mg | Freq: Once | ORAL | Status: DC | PRN

## 2014-04-23 MED ORDER — PHENYLEPHRINE HCL 10 MG/ML IJ SOLN
10.0000 mg | INTRAVENOUS | Status: DC | PRN
  Administered 2014-04-23: 30 ug/min via INTRAVENOUS

## 2014-04-23 MED ORDER — PROPOFOL 10 MG/ML IV BOLUS
INTRAVENOUS | Status: AC
  Filled 2014-04-23: qty 20

## 2014-04-23 MED ORDER — HEMOSTATIC AGENTS (NO CHARGE) OPTIME
TOPICAL | Status: DC | PRN
  Administered 2014-04-23: 1 via TOPICAL

## 2014-04-23 MED ORDER — FENTANYL CITRATE 0.05 MG/ML IJ SOLN
50.0000 ug | INTRAMUSCULAR | Status: DC | PRN
  Administered 2014-04-23 (×2): 50 ug via INTRAVENOUS
  Filled 2014-04-23 (×2): qty 2

## 2014-04-23 MED ORDER — LEVETIRACETAM 500 MG PO TABS
500.0000 mg | ORAL_TABLET | Freq: Two times a day (BID) | ORAL | Status: DC
  Administered 2014-04-24 – 2014-04-25 (×4): 500 mg via ORAL
  Filled 2014-04-23 (×7): qty 1

## 2014-04-23 MED ORDER — HYDROMORPHONE HCL PF 1 MG/ML IJ SOLN: 0.2500 mg | INTRAMUSCULAR | Status: DC | PRN

## 2014-04-23 MED ORDER — FENTANYL CITRATE 0.05 MG/ML IJ SOLN
INTRAMUSCULAR | Status: AC
  Filled 2014-04-23: qty 5

## 2014-04-23 MED ORDER — VITAL AF 1.2 CAL PO LIQD
1000.0000 mL | ORAL | Status: DC
  Administered 2014-04-25 – 2014-04-27 (×3): 1000 mL
  Filled 2014-04-23 (×9): qty 1000

## 2014-04-23 MED ORDER — MIDAZOLAM HCL 2 MG/2ML IJ SOLN
1.0000 mg | INTRAMUSCULAR | Status: DC | PRN
  Administered 2014-04-23 – 2014-04-24 (×2): 2 mg via INTRAVENOUS
  Filled 2014-04-23 (×3): qty 2

## 2014-04-23 MED ORDER — CARVEDILOL 25 MG PO TABS
25.0000 mg | ORAL_TABLET | Freq: Two times a day (BID) | ORAL | Status: DC
  Administered 2014-04-23 – 2014-04-24 (×2): 25 mg via ORAL
  Filled 2014-04-23 (×5): qty 1

## 2014-04-23 MED ORDER — OXYCODONE HCL 5 MG PO TABS: 5.0000 mg | ORAL_TABLET | Freq: Once | ORAL | Status: DC | PRN

## 2014-04-23 MED ORDER — ROCURONIUM BROMIDE 50 MG/5ML IV SOLN
INTRAVENOUS | Status: AC
  Filled 2014-04-23: qty 1

## 2014-04-23 MED ORDER — ARTIFICIAL TEARS OP OINT
TOPICAL_OINTMENT | OPHTHALMIC | Status: AC
  Filled 2014-04-23: qty 3.5

## 2014-04-23 MED ORDER — LABETALOL HCL 5 MG/ML IV SOLN
INTRAVENOUS | Status: AC
  Filled 2014-04-23: qty 4

## 2014-04-23 MED ORDER — 0.9 % SODIUM CHLORIDE (POUR BTL) OPTIME
TOPICAL | Status: DC | PRN
  Administered 2014-04-23 (×2): 1000 mL

## 2014-04-23 MED ORDER — MIDAZOLAM HCL 2 MG/2ML IJ SOLN
INTRAMUSCULAR | Status: AC
  Administered 2014-04-23: 2 mg
  Filled 2014-04-23: qty 2

## 2014-04-23 MED ORDER — PROPOFOL 10 MG/ML IV BOLUS
INTRAVENOUS | Status: DC | PRN
  Administered 2014-04-23: 120 mg via INTRAVENOUS

## 2014-04-23 MED ORDER — LIDOCAINE HCL (CARDIAC) 20 MG/ML IV SOLN
INTRAVENOUS | Status: DC | PRN
  Administered 2014-04-23: 75 mg via INTRAVENOUS

## 2014-04-23 MED ORDER — LIDOCAINE HCL (CARDIAC) 20 MG/ML IV SOLN
INTRAVENOUS | Status: AC
  Filled 2014-04-23: qty 5

## 2014-04-23 MED ORDER — FENTANYL CITRATE 0.05 MG/ML IJ SOLN
INTRAMUSCULAR | Status: DC | PRN
  Administered 2014-04-23 (×2): 100 ug via INTRAVENOUS
  Administered 2014-04-23: 50 ug via INTRAVENOUS

## 2014-04-23 MED ORDER — MEPERIDINE HCL 25 MG/ML IJ SOLN: 6.2500 mg | INTRAMUSCULAR | Status: DC | PRN

## 2014-04-23 MED ORDER — CEFAZOLIN SODIUM-DEXTROSE 2-3 GM-% IV SOLR
INTRAVENOUS | Status: DC | PRN
  Administered 2014-04-23: 2 g via INTRAVENOUS

## 2014-04-23 MED ORDER — HYDROGEN PEROXIDE 3 % EX SOLN
CUTANEOUS | Status: DC | PRN
  Administered 2014-04-23: 1 via TOPICAL

## 2014-04-23 MED ORDER — CEFAZOLIN SODIUM-DEXTROSE 2-3 GM-% IV SOLR
2.0000 g | Freq: Three times a day (TID) | INTRAVENOUS | Status: DC
  Administered 2014-04-23 – 2014-04-27 (×11): 2 g via INTRAVENOUS
  Filled 2014-04-23 (×14): qty 50

## 2014-04-23 MED ORDER — ADULT MULTIVITAMIN LIQUID CH
5.0000 mL | Freq: Every day | ORAL | Status: DC
  Administered 2014-04-24 – 2014-04-28 (×5): 5 mL
  Filled 2014-04-23 (×7): qty 5

## 2014-04-23 MED ORDER — FENTANYL CITRATE 0.05 MG/ML IJ SOLN
25.0000 ug/h | INTRAMUSCULAR | Status: DC
  Administered 2014-04-23 – 2014-04-24 (×2): 150 ug/h via INTRAVENOUS
  Administered 2014-04-25: 50 ug/h via INTRAVENOUS
  Administered 2014-04-25: 75 ug/h via INTRAVENOUS
  Filled 2014-04-23 (×3): qty 50

## 2014-04-23 MED ORDER — PANTOPRAZOLE SODIUM 40 MG IV SOLR
40.0000 mg | Freq: Every day | INTRAVENOUS | Status: DC
  Administered 2014-04-23 – 2014-04-24 (×2): 40 mg via INTRAVENOUS
  Filled 2014-04-23: qty 40

## 2014-04-23 MED ORDER — LABETALOL HCL 5 MG/ML IV SOLN
INTRAVENOUS | Status: DC | PRN
  Administered 2014-04-23 (×2): 10 mg via INTRAVENOUS

## 2014-04-23 MED ORDER — ONDANSETRON HCL 4 MG/2ML IJ SOLN: 4.0000 mg | Freq: Once | INTRAMUSCULAR | Status: AC | PRN

## 2014-04-23 MED ORDER — SURGIFOAM 100 EX MISC
CUTANEOUS | Status: DC | PRN
  Administered 2014-04-23: 17:00:00 via TOPICAL

## 2014-04-23 MED ORDER — FENTANYL CITRATE 0.05 MG/ML IJ SOLN
50.0000 ug | INTRAMUSCULAR | Status: DC | PRN
  Administered 2014-04-26 – 2014-04-27 (×3): 50 ug via INTRAVENOUS
  Filled 2014-04-23 (×5): qty 2

## 2014-04-23 MED ORDER — ONDANSETRON HCL 4 MG/2ML IJ SOLN: 4.0000 mg | Freq: Once | INTRAMUSCULAR | Status: DC | PRN

## 2014-04-23 MED ORDER — POTASSIUM CHLORIDE 20 MEQ/15ML (10%) PO LIQD
40.0000 meq | ORAL | Status: AC
  Administered 2014-04-23: 40 meq
  Filled 2014-04-23 (×2): qty 30

## 2014-04-23 MED ORDER — SUCCINYLCHOLINE CHLORIDE 20 MG/ML IJ SOLN
INTRAMUSCULAR | Status: AC
  Filled 2014-04-23: qty 1

## 2014-04-23 MED ORDER — ROCURONIUM BROMIDE 100 MG/10ML IV SOLN
INTRAVENOUS | Status: DC | PRN
  Administered 2014-04-23: 50 mg via INTRAVENOUS
  Administered 2014-04-23: 25 mg via INTRAVENOUS

## 2014-04-23 MED ORDER — SODIUM CHLORIDE 0.9 % IR SOLN
Status: DC | PRN
  Administered 2014-04-23: 17:00:00

## 2014-04-23 SURGICAL SUPPLY — 79 items
BAG DECANTER FOR FLEXI CONT (MISCELLANEOUS) ×3 IMPLANT
BANDAGE GAUZE 4  KLING STR (GAUZE/BANDAGES/DRESSINGS) ×3 IMPLANT
BENZOIN TINCTURE PRP APPL 2/3 (GAUZE/BANDAGES/DRESSINGS) ×3 IMPLANT
BIT DRILL WIRE PASS 1.3MM (BIT) IMPLANT
BLADE 10 SAFETY STRL DISP (BLADE) IMPLANT
BLADE SURG 11 STRL SS (BLADE) IMPLANT
BLADE SURG ROTATE 9660 (MISCELLANEOUS) IMPLANT
BNDG COHESIVE 4X5 TAN NS LF (GAUZE/BANDAGES/DRESSINGS) IMPLANT
BRUSH SCRUB EZ PLAIN DRY (MISCELLANEOUS) ×3 IMPLANT
BUR ACORN 9.0 PRECISION (BURR) IMPLANT
BUR ACORN 9.0MM PRECISION (BURR)
BUR ROUTER D-58 CRANI (BURR) IMPLANT
CANISTER SUCT 3000ML (MISCELLANEOUS) ×6 IMPLANT
CLIP TI MEDIUM 6 (CLIP) IMPLANT
CLOSURE WOUND 1/2 X4 (GAUZE/BANDAGES/DRESSINGS) ×1
CONT SPEC 4OZ CLIKSEAL STRL BL (MISCELLANEOUS) ×3 IMPLANT
CORDS BIPOLAR (ELECTRODE) ×3 IMPLANT
COVER TABLE BACK 60X90 (DRAPES) IMPLANT
DECANTER SPIKE VIAL GLASS SM (MISCELLANEOUS) IMPLANT
DERMABOND ADVANCED (GAUZE/BANDAGES/DRESSINGS)
DERMABOND ADVANCED .7 DNX12 (GAUZE/BANDAGES/DRESSINGS) IMPLANT
DRAPE NEUROLOGICAL W/INCISE (DRAPES) ×3 IMPLANT
DRAPE SURG 17X23 STRL (DRAPES) ×6 IMPLANT
DRAPE WARM FLUID 44X44 (DRAPE) ×3 IMPLANT
DRILL WIRE PASS 1.3MM (BIT)
DRSG OPSITE 4X5.5 SM (GAUZE/BANDAGES/DRESSINGS) ×6 IMPLANT
ELECT CAUTERY BLADE 6.4 (BLADE) ×3 IMPLANT
ELECT REM PT RETURN 9FT ADLT (ELECTROSURGICAL) ×3
ELECTRODE REM PT RTRN 9FT ADLT (ELECTROSURGICAL) ×1 IMPLANT
GAUZE SPONGE 4X4 16PLY XRAY LF (GAUZE/BANDAGES/DRESSINGS) IMPLANT
GLOVE BIO SURGEON STRL SZ8 (GLOVE) ×3 IMPLANT
GLOVE BIO SURGEON STRL SZ8.5 (GLOVE) ×3 IMPLANT
GLOVE BIOGEL PI IND STRL 7.0 (GLOVE) ×2 IMPLANT
GLOVE BIOGEL PI INDICATOR 7.0 (GLOVE) ×4
GLOVE EXAM NITRILE LRG STRL (GLOVE) IMPLANT
GLOVE EXAM NITRILE MD LF STRL (GLOVE) IMPLANT
GLOVE EXAM NITRILE XL STR (GLOVE) IMPLANT
GLOVE EXAM NITRILE XS STR PU (GLOVE) IMPLANT
GLOVE INDICATOR 8.5 STRL (GLOVE) ×3 IMPLANT
GLOVE SS BIOGEL STRL SZ 6.5 (GLOVE) ×2 IMPLANT
GLOVE SS BIOGEL STRL SZ 8 (GLOVE) ×1 IMPLANT
GLOVE SUPERSENSE BIOGEL SZ 6.5 (GLOVE) ×4
GLOVE SUPERSENSE BIOGEL SZ 8 (GLOVE) ×2
GOWN STRL REUS W/ TWL LRG LVL3 (GOWN DISPOSABLE) ×1 IMPLANT
GOWN STRL REUS W/ TWL XL LVL3 (GOWN DISPOSABLE) ×2 IMPLANT
GOWN STRL REUS W/TWL 2XL LVL3 (GOWN DISPOSABLE) IMPLANT
GOWN STRL REUS W/TWL LRG LVL3 (GOWN DISPOSABLE) ×2
GOWN STRL REUS W/TWL XL LVL3 (GOWN DISPOSABLE) ×4
GRAFT DURAGEN MATRIX 2WX2L ×3 IMPLANT
HEMOSTAT SURGICEL 2X14 (HEMOSTASIS) IMPLANT
HOOK DURA (MISCELLANEOUS) ×3 IMPLANT
KIT BASIN OR (CUSTOM PROCEDURE TRAY) ×3 IMPLANT
KIT ROOM TURNOVER OR (KITS) ×3 IMPLANT
NEEDLE HYPO 25X1 1.5 SAFETY (NEEDLE) ×3 IMPLANT
NS IRRIG 1000ML POUR BTL (IV SOLUTION) ×3 IMPLANT
PACK CRANIOTOMY (CUSTOM PROCEDURE TRAY) ×3 IMPLANT
PAD ARMBOARD 7.5X6 YLW CONV (MISCELLANEOUS) ×9 IMPLANT
PAD EYE OVAL STERILE LF (GAUZE/BANDAGES/DRESSINGS) IMPLANT
PATTIES SURGICAL .25X.25 (GAUZE/BANDAGES/DRESSINGS) IMPLANT
PATTIES SURGICAL .5 X.5 (GAUZE/BANDAGES/DRESSINGS) IMPLANT
PATTIES SURGICAL .5 X3 (DISPOSABLE) IMPLANT
PATTIES SURGICAL 1X1 (DISPOSABLE) IMPLANT
PIN MAYFIELD SKULL DISP (PIN) IMPLANT
SPONGE GAUZE 4X4 12PLY (GAUZE/BANDAGES/DRESSINGS) ×6 IMPLANT
SPONGE NEURO XRAY DETECT 1X3 (DISPOSABLE) IMPLANT
SPONGE SURGIFOAM ABS GEL 100 (HEMOSTASIS) ×3 IMPLANT
STAPLER SKIN PROX WIDE 3.9 (STAPLE) IMPLANT
STAPLER VISISTAT 35W (STAPLE) ×3 IMPLANT
STRIP CLOSURE SKIN 1/2X4 (GAUZE/BANDAGES/DRESSINGS) ×2 IMPLANT
SUT ETHILON 3 0 FSL (SUTURE) IMPLANT
SUT NURALON 4 0 TR CR/8 (SUTURE) ×3 IMPLANT
SUT VIC AB 2-0 CT1 18 (SUTURE) ×9 IMPLANT
SYR 20ML ECCENTRIC (SYRINGE) ×3 IMPLANT
SYR CONTROL 10ML LL (SYRINGE) ×3 IMPLANT
TOWEL OR 17X24 6PK STRL BLUE (TOWEL DISPOSABLE) ×3 IMPLANT
TOWEL OR 17X26 10 PK STRL BLUE (TOWEL DISPOSABLE) ×3 IMPLANT
TRAY FOLEY CATH 14FRSI W/METER (CATHETERS) IMPLANT
UNDERPAD 30X30 INCONTINENT (UNDERPADS AND DIAPERS) IMPLANT
WATER STERILE IRR 1000ML POUR (IV SOLUTION) ×3 IMPLANT

## 2014-04-23 NOTE — Progress Notes (Addendum)
PULMONARY / CRITICAL CARE MEDICINE   Name: Alejandro [name] MRN: [id number] DOB: Mar 25, 1946    ADMISSION DATE:  04/19/2014 CONSULTATION DATE:  04/19/2014  REFERRING MD :  EDP PRIMARY SERVICE: PCCM  CHIEF COMPLAINT:  VDRF post-op crani for SDH  BRIEF PATIENT DESCRIPTION: 68 yo with AF on Coumadin admitted after fall.  Head CT revealed SDH with midline shift, and hemorrhagic contusion of the right temporal lobe. Underwent craniotomy and hematoma evacuation.  SIGNIFICANT EVENTS / STUDIES:  5/18  Head CT >>> SDH with midline shift, and hemorrhagic contusion of the right temporal lobe 5/18  OR >>> Craniotomy, hematoma evacuation  LINES / TUBES:  CULTURES:  ANTIBIOTICS: Cefazolin 5/19 >>> ???  INTERVAL HISTORY:  Agitated delirium  VITAL SIGNS: Temp:  [97.7 F (36.5 C)-100.1 F (37.8 C)] 99.3 F (37.4 C) (05/22 0337) Pulse Rate:  [89-166] 114 (05/22 0100) Resp:  [16-37] 24 (05/22 0100) BP: (116-184)/(55-119) 162/86 mmHg (05/22 0100) SpO2:  [92 %-97 %] 93 % (05/22 0100) Weight:  [107.6 kg (237 lb 3.4 oz)] 107.6 kg (237 lb 3.4 oz) (05/22 0500)  HEMODYNAMICS:   VENTILATOR SETTINGS:   INTAKE / OUTPUT: Intake/Output     05/21 0701 - 05/22 0700   I.V. (mL/kg) 1334.3 (12.4)   NG/GT 90   IV Piggyback 205   Total Intake(mL/kg) 1629.3 (15.1)   Urine (mL/kg/hr) 2225 (0.9)   Total Output 2225   Net -595.7        PHYSICAL EXAMINATION: General: No respiratory distress Neuro: Definitely more awake than yesterday, but agitated, confused HEENT: Surgical dressing intact Cardiovascular:  Irregular, no murmurs Lungs:  Bilateral scattered rhonchi, oropharyngeal secretions Abdomen:  Soft, bowel sounds present Musculoskeletal:  Trace edema Skin:  No rash  LABS:  CBC  Recent Labs Lab 04/20/14 0500 04/21/14 0319 04/23/14 0245  WBC 13.6* 16.2* 10.8*  HGB 12.0* 12.2* 10.0*  HCT 34.3* 35.7* 29.3*  PLT 210 168 174   Coag's  Recent Labs Lab 04/20/14 0510 04/21/14 0319  04/22/14 0300  INR 1.25 1.02 1.21   BMET  Recent Labs Lab 04/20/14 0500 04/21/14 0319 04/23/14 0245  NA 136* 135* 140  K 3.8 4.3 3.2*  CL 102 101 103  CO2 21 22 21   BUN 14 13 19   CREATININE 0.99 0.95 0.91  GLUCOSE 153* 129* 117*   Electrolytes  Recent Labs Lab 04/20/14 0500 04/21/14 0319 04/23/14 0245  CALCIUM 8.2* 8.8 8.7  MG 1.8  --   --   PHOS 2.2*  --   --    Sepsis Markers No results found for this basename: LATICACIDVEN, PROCALCITON, O2SATVEN,  in the last 168 hours ABG No results found for this basename: PHART, PCO2ART, PO2ART,  in the last 168 hours Liver Enzymes  Recent Labs Lab 04/19/14 1315  AST 23  ALT 18  ALKPHOS 64  BILITOT 0.7  ALBUMIN 4.2   Cardiac Enzymes  Recent Labs Lab 04/19/14 1315  TROPONINI <0.30   Glucose No results found for this basename: GLUCAP,  in the last 168 hours  IMAGING:  Dg Abd Portable 1v  04/22/2014   CLINICAL DATA:  Panda tube placement  EXAM: PORTABLE ABDOMEN - 1 VIEW  COMPARISON:  DG ABD PORTABLE 1V dated 04/22/2014  FINDINGS: Metallic feeding tube tip has been advanced and again projects in the region of the body of the stomach. Bowel gas pattern is normal.  IMPRESSION: Interval advancement of the enteric feeding tube tip again projecting in the region of the body of the  stomach.   Electronically Signed   By: Margaree Mackintosh M.D.   On: 04/22/2014 18:43   Dg Abd Portable 1v  04/22/2014   CLINICAL DATA:  Feeding tube placement.  EXAM: PORTABLE ABDOMEN - 1 VIEW  COMPARISON:  None.  FINDINGS: Feeding tube tip is in the body of the stomach just below the gastroesophageal junction. Bowel gas pattern is normal.  IMPRESSION: Feeding tube tip in the body of the stomach just below the gastroesophageal junction.   Electronically Signed   By: Rozetta Nunnery M.D.   On: 04/22/2014 11:36   ASSESSMENT / PLAN:  PULMONARY A:  Difficulty managing secretions P:   May need airway Supplemental oxygen PRN  CARDIOVASCULAR A:   HTN AF, controlled rate P:  Labetalol PRN Preadmission Coreg (increase 25 ) / Lisinopril (1/2 dose) / Zocor Cardizem bolus and gtt Metoprolol PRN  RENAL A:   No active issues P:   NS@75  Trend BMP K 40 x 2  GASTROINTESTINAL A:  Nutrition GI Px is not indicated P:   Start TF  HEMATOLOGIC A:   Coumadin induced coagulopathy, reversed VTE Px P:  Likely not a candidate for any further anticoagulation SCD  INFECTIOUS A:   No active issues P:   No intervention required  ENDOCRINE A:   No active issues P:   No intervention required  NEUROLOGIC A:   SDH s/p evacuation ICH Alcohol abuse Delirium, alcoholic? P:   Per Neurosurgery Thiamine/Folate CIWA / Ativan PRN Start Risperdal Fentanyl PRN Should we repeat head CT?  I have personally obtained history, examined patient, evaluated and interpreted laboratory and imaging results, reviewed medical records, formulated assessment / plan and placed orders.  Doree Fudge, MD Pulmonary and [place] Pager: [phone]  04/23/2014, 6:31 AM

## 2014-04-23 NOTE — Progress Notes (Signed)
OT Cancellation Note  Patient Details Name: Alejandro [name] MRN: [id number] DOB: [birth year]   Cancelled Treatment:    Reason Eval/Treat Not Completed: Other (comment)  Spoke to RN.  Pt is restless and has increased HR. Not ready for OT today.  Will check back.    Lesle Chris 04/23/2014, 1:44 PM Lesle Chris, OTR/L [phone] 04/23/2014

## 2014-04-23 NOTE — Progress Notes (Signed)
PT Cancellation Note  Patient Details Name: Alejandro [name] MRN: [id number] DOB: 1946/10/16   Cancelled Treatment:     Patient OTF, not appropriate for PT today per RN, will hold therapies at this time.   Duncan Dull 04/23/2014, 2:31 PM Alben Deeds, PT DPT  [phone]

## 2014-04-23 NOTE — Op Note (Signed)
Preoperative diagnosis: Right temporal intracerebral hematoma  Postoperative diagnosis: Same  Procedure: Reexploration of right pterional craniotomy for partial temporal lobectomy and evacuation of right temporal hematoma with implantation of the bone flap in the right lateral abdominal wall cavity  Surgeon: Dominica Severin Branko Steeves  Anesthesia: Gen.  EBL: Minimal  History of present illness: Patient is a 68 year old gentleman who underwent craniotomy for evacuation of a right acute subdural hematoma on the night patient initially did very well however then had to be sedated for presumptive alcohol withdrawal symptoms. Initial followup CT scan showed excellent evacuation of the subdural hematoma in stable small right temporal contusion followup CT scan 40 hours later showed expansion of his right temporal intracerebral hematoma and possible venous infarction with temporal lobe swelling and possible impending uncal herniation. So I recommended to the family emergency reexploration of craniotomy partial temporal lobectomy and evacuation of into cervical hematoma I extensively the risks and benefits of the operation with the patient and family and they understood and agreed to proceed forward.  Operative procedure: Patient brought into the or was induced under general anesthesia positioned supine a shoulder bump under his right shoulder his head turned the left and his right side of his head exposed his right lateral bowel wall was also shaved prepped and draped in routine sterile fashion along with his cranial incision. The cranial incision was opened up the temporalis was divided the cranial flap was removed from the [facility] plating system the dura was then opened up and the brain was a markedly swollen under pressure I extended my subtemporal craniectomy and divided the dura to get down to the temporal floor I could see the anterior temporal fossa as well as a temporal floor I could also see areas of hemorrhage and  contused and possibly even infarcted brain. So I went into the medial temporal gyrus with a corticectomy and took out approximately 3-4 cm of inferior temporal lobe from the medial and inferior gyrus through the hematoma got back to where it was clean white matter margins around the hematoma and carried this anteriorly to the infratemporal fossa.. the brain was markedly slack and relaxed at the end of the temporal lobectomy and evacuation of hematoma. The lobectomy defect was copiously irrigated meticulous hemostasis was maintained with Surgicel as well as peroxide soak on both. The dura was reapproximated and DuraGen plus was overlaid top of the dura and the temporalis was reapproximated over the DuraGen plus and the scalp was closed. Staples were applied the skin. At this point attention second the abdominal wall cavity through separate incision in the lateral, wall a pocket was created the flap was inserted in the pocket and the pocket was closed with after Vicryl and a running 4 subcuticular. At the end of case on it counts sponge counts were correct.

## 2014-04-23 NOTE — Progress Notes (Signed)
Received intubated pt from Neuro PACU placed on vent.  Pt tolerating well, RT to monitor

## 2014-04-23 NOTE — Progress Notes (Signed)
INITIAL NUTRITION ASSESSMENT  DOCUMENTATION CODES Per approved criteria  -Obesity Unspecified   INTERVENTION: Initiate Vital AF 1.2 @ 25 ml/hr via NG tube and increase by 10 ml every 4 hours to goal rate of 65 ml/hr.   Tube feeding regimen provides 1872 kcal (99% of needs), 117 grams of protein, and 1265 ml of H2O.   Monitor magnesium, potassium, and phosphorus daily for at least 3 days, MD to replete as needed, as pt is at risk for refeeding syndrome given hx of ETOH.  NUTRITION DIAGNOSIS: Inadequate oral intake related to inability to eat as evidenced by NPO status  Goal: Pt to meet >/= 90% of their estimated nutrition needs   Monitor:  Respiratory status, TF initiation and tolerance, weight trend, labs  Reason for Assessment: Consult received to initiate and manage enteral nutrition support.  68 y.o. male  Admitting Dx: <principal problem not specified>  ASSESSMENT: 68 yo with AF on Coumadin admitted after fall. Head CT revealed SDH with midline shift, and hemorrhagic contusion of the right temporal lobe. Underwent craniotomy and hematoma evacuation. Per MD notes, pt with hx of ETOH abuse.  Pt discussed during ICU rounds and with RN.  Pt with NG tube in place, tip in body of stomach. Has sitter at bedside. Pt unable to answer any questions. Per chart review pt has not had any weight loss but likely wt gain.  Potassium low and being replaced.   Nutrition Focused Physical Exam:  Subcutaneous Fat:  Orbital Region: WNL Upper Arm Region: WNL Thoracic and Lumbar Region: WNL  Muscle:  Temple Region: WNL Clavicle Bone Region: WNL Clavicle and Acromion Bone Region: WNL Scapular Bone Region: WNL Dorsal Hand: WNL Patellar Region: WNL Anterior Thigh Region: mild depletion Posterior Calf Region: mild depletion  Edema: not present    Height: Ht Readings from Last 1 Encounters:  04/19/14 5\' 8"  (1.727 m)    Weight: Wt Readings from Last 1 Encounters:  04/23/14 237 lb  3.4 oz (107.6 kg)    Ideal Body Weight: 70 kg   % Ideal Body Weight: 154%  Wt Readings from Last 10 Encounters:  04/23/14 237 lb 3.4 oz (107.6 kg)  04/23/14 237 lb 3.4 oz (107.6 kg)  03/15/14 239 lb (108.41 kg)  09/08/13 235 lb 1.9 oz (106.65 kg)  04/23/13 228 lb 6.4 oz (103.602 kg)  03/10/13 221 lb (100.245 kg)  02/27/13 219 lb 6.4 oz (99.519 kg)  02/10/13 217 lb 9.6 oz (98.703 kg)  02/02/13 223 lb (101.152 kg)  01/26/13 213 lb 10 oz (96.9 kg)    Usual Body Weight: 235-239 lb   % Usual Body Weight: 100%  BMI:  Body mass index is 36.08 kg/(m^2).  Estimated Nutritional Needs: Kcal: 1900-2100  Protein: 95-105 grams  Fluid: 1.9-2.1 L/day  Skin: right head incision, abdominal incision  Diet Order: Full Liquid  EDUCATION NEEDS: -No education needs identified at this time   Intake/Output Summary (Last 24 hours) at 04/23/14 0952 Last data filed at 04/23/14 0800  Gross per 24 hour  Intake   2110 ml  Output   2670 ml  Net   -560 ml    Last BM: 5/18   Labs:   Recent Labs Lab 04/20/14 0500 04/21/14 0319 04/23/14 0245  NA 136* 135* 140  K 3.8 4.3 3.2*  CL 102 101 103  CO2 21 22 21   BUN 14 13 19   CREATININE 0.99 0.95 0.91  CALCIUM 8.2* 8.8 8.7  MG 1.8  --   --  PHOS 2.2*  --   --   GLUCOSE 153* 129* 117*    CBG (last 3)  No results found for this basename: GLUCAP,  in the last 72 hours  Scheduled Meds: . antiseptic oral rinse  15 mL Mouth Rinse q12n4p  . atorvastatin  20 mg Oral q1800  . carvedilol  25 mg Oral BID WC  . chlorhexidine  15 mL Mouth Rinse BID  . folic acid  1 mg Oral Daily  . levETIRAcetam  500 mg Intravenous Q12H  . lisinopril  10 mg Oral Daily  . potassium chloride  40 mEq Per Tube Q4H  . risperiDONE  0.5 mg Oral q morning - 10a  . risperiDONE  1 mg Oral QHS  . thiamine  100 mg Oral Daily    Continuous Infusions: . sodium chloride 75 mL/hr at 04/23/14 0800    Past Medical History  Diagnosis Date  . Systolic heart failure    . 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 Dyslipidemia   . HTN (hypertension)   . Atrial fibrillation   . Cancer     around nose  . Tobacco abuse   . Alcohol abuse   . H/O hiatal hernia     Past Surgical History  Procedure Laterality Date  . Coronary artery bypass graft  12/24/2008    4 vessel  . Umbilical hernia repair  2010  . Hernia repair  2012  . Coronary artery bypass graft  2012  . Ventral hernia repair N/A 01/22/2013    Procedure: HERNIA REPAIR VENTRAL ADULT;  Surgeon: [name], MD;  Location: [facility];  Service: General;  Laterality: N/A;  . Application of a-cell of chest/abdomen N/A 01/22/2013    Procedure: APPLICATION OF A-CELL OF CHEST/ABDOMEN;  Surgeon: [name], MD;  Location: [facility];  Service: General;  Laterality: N/A;  . Cystoscopy N/A 01/22/2013    Procedure: Consuela Mimes;  Surgeon: [name], MD;  Location: [facility] [place] [facility];  Service: Urology;  Laterality: N/A;  Cystoscopy with balloon dilation. Insertion of coude catheter.  . Craniotomy Right 04/19/2014    Procedure: CRANIOTOMY HEMATOMA EVACUATION SUBDURAL;  Surgeon: [name], MD;  Location: [facility];  Service: Neurosurgery;  Laterality: Right;  right    [place], [place], [place] Pager [phone] After Hours Pager

## 2014-04-23 NOTE — Progress Notes (Signed)
[facility] Progress Note Patient Name: HEITOR STEINHOFF DOB: [birth year] MRN: [id number]  Date of Service  04/23/2014   HPI/Events of Note   Pulling at tubes/lines  eICU Interventions  Add fentanyl gtt, prn versed   Intervention Category Minor Interventions: Agitation / anxiety - evaluation and management  Juanito Doom 04/23/2014, 8:48 PM

## 2014-04-23 NOTE — Progress Notes (Signed)
Patient ID: Alejandro [name], male   DOB: 1946/11/19, 68 y.o.   MRN: [id number] Patient went followup CT scan of his head which showed an evolution of intraparenchymal hemorrhagic contusion or possibly venous infarction in the right temporal lobe with significant right temporal lobe swelling. Patient's clinical exam very difficult to follow the last couple days secondary to sedation for possible alcohol withdrawal symptoms. Currently the patient will arouse to voice he will follow commands symmetrically his pupils are equal and reactive however I recommended a reexploration of his craniotomy defect for evacuation of right temporal intracerebral hematoma as well as possible partial right temporal lobectomy. I extensively gone over the risks and benefits of the operation the patient and family as well as perioperative course and expectations of outcome and alternatives surgery and they understand and agree to proceed forward.

## 2014-04-23 NOTE — Anesthesia Postprocedure Evaluation (Signed)
  Anesthesia Post-op Note  Patient: Alejandro [name]  Procedure(s) Performed: Procedure(s): Craniotomy for Intracerebral Hemorrhage (Right)  Patient Location: NICU  Anesthesia Type:General  Level of Consciousness: sedated and Patient remains intubated per anesthesia plan  Airway and Oxygen Therapy: Patient remains intubated per anesthesia plan and Patient placed on Ventilator (see vital sign flow sheet for setting)  Post-op Pain: none  Post-op Assessment: Post-op Vital signs reviewed, Patient's Cardiovascular Status Stable, Respiratory Function Stable and Patent Airway  Post-op Vital Signs: stable  Last Vitals:  Filed Vitals:   04/23/14 1200  BP: 157/79  Pulse: 106  Temp:   Resp: 21    Complications: No apparent anesthesia complications

## 2014-04-23 NOTE — Progress Notes (Signed)
Subjective: Patient reports Still somewhat recently received pain medication but office Precedex  Objective: Vital signs in last 24 hours: Temp:  [97.7 F (36.5 C)-100.1 F (37.8 C)] 99.3 F (37.4 C) (05/22 0337) Pulse Rate:  [72-166] 103 (05/22 0600) Resp:  [16-37] 21 (05/22 0600) BP: (116-184)/(55-137) 143/83 mmHg (05/22 0600) SpO2:  [91 %-97 %] 95 % (05/22 0600) FiO2 (%):  [50 %] 50 % (05/22 0700) Weight:  [107.6 kg (237 lb 3.4 oz)] 107.6 kg (237 lb 3.4 oz) (05/22 0500)  Intake/Output from previous day: 05/21 0701 - 05/22 0700 In: 2109.3 [I.V.:1784.3; NG/GT:120; IV Piggyback:205] Out: 2795 [Urine:2795] Intake/Output this shift:    Awakens to voice does follow commands symmetrically  Lab Results:  Recent Labs  04/21/14 0319 04/23/14 0245  WBC 16.2* 10.8*  HGB 12.2* 10.0*  HCT 35.7* 29.3*  PLT 168 174   BMET  Recent Labs  04/21/14 0319 04/23/14 0245  NA 135* 140  K 4.3 3.2*  CL 101 103  CO2 22 21  GLUCOSE 129* 117*  BUN 13 19  CREATININE 0.95 0.91  CALCIUM 8.8 8.7    Studies/Results: Dg Abd Portable 1v  04/22/2014   CLINICAL DATA:  Panda tube placement  EXAM: PORTABLE ABDOMEN - 1 VIEW  COMPARISON:  DG ABD PORTABLE 1V dated 04/22/2014  FINDINGS: Metallic feeding tube tip has been advanced and again projects in the region of the body of the stomach. Bowel gas pattern is normal.  IMPRESSION: Interval advancement of the enteric feeding tube tip again projecting in the region of the body of the stomach.   Electronically Signed   By: Margaree Mackintosh M.D.   On: 04/22/2014 18:43   Dg Abd Portable 1v  04/22/2014   CLINICAL DATA:  Feeding tube placement.  EXAM: PORTABLE ABDOMEN - 1 VIEW  COMPARISON:  None.  FINDINGS: Feeding tube tip is in the body of the stomach just below the gastroesophageal junction. Bowel gas pattern is normal.  IMPRESSION: Feeding tube tip in the body of the stomach just below the gastroesophageal junction.   Electronically Signed   By: Rozetta Nunnery M.D.   On: 04/22/2014 11:36    Assessment/Plan: CT scan this morning continue weaning the sedation progressive mobilization PTOT  LOS: 4 days     [name] 04/23/2014, 8:15 AM

## 2014-04-23 NOTE — Transfer of Care (Signed)
Immediate Anesthesia Transfer of Care Note  Patient: Alejandro [name]  Procedure(s) Performed: Procedure(s): Craniotomy for Intracerebral Hemorrhage (Right)  Patient Location: PACU and NICU  Anesthesia Type:General  Level of Consciousness: sedated, unresponsive and Patient remains intubated per anesthesia plan  Airway & Oxygen Therapy: Patient remains intubated per anesthesia plan and Patient placed on Ventilator (see vital sign flow sheet for setting)  Post-op Assessment: Report given to PACU RN and Post -op Vital signs reviewed and stable  Post vital signs: Reviewed and stable  Complications: No apparent anesthesia complications

## 2014-04-23 NOTE — Progress Notes (Signed)
eLink Physician-Brief Progress Note Patient Name: Alejandro [name] DOB: Jun 05, 1946 MRN: [id number]  Date of Service  04/23/2014   HPI/Events of Note  Back from OR intubated Hypertension, tachycardic  eICU Interventions  Use fentanyl now for pain, if BP not improved then use labetalol Sedation orders and vent orders written CXR/ABG    Intervention Category Major Interventions: Airway management;Respiratory failure - evaluation and management  Juanito Doom 04/23/2014, 6:18 PM

## 2014-04-23 NOTE — Anesthesia Preprocedure Evaluation (Addendum)
Anesthesia Evaluation  Patient identified by MRN, date of birth, ID band Patient awake    Reviewed: Allergy & Precautions, H&P , NPO status , Patient's Chart, lab work & pertinent test results  Airway Mallampati: II TM Distance: >3 FB Neck ROM: Full    Dental   Pulmonary Current Smoker,          Cardiovascular hypertension, Pt. on medications + dysrhythmias Atrial Fibrillation  H/O Afib on Coumadin   Neuro/Psych Subdural evacuated 5/18. Seems to have developed DT's. Managed by CCM    GI/Hepatic   Endo/Other    Renal/GU      Musculoskeletal   Abdominal   Peds  Hematology   Anesthesia Other Findings   Reproductive/Obstetrics                        Anesthesia Physical Anesthesia Plan  ASA: III  Anesthesia Plan: General   Post-op Pain Management:    Induction: Intravenous  Airway Management Planned: Oral ETT  Additional Equipment: Arterial line  Intra-op Plan:   Post-operative Plan: Post-operative intubation/ventilation  Informed Consent: I have reviewed the patients History and Physical, chart, labs and discussed the procedure including the risks, benefits and alternatives for the proposed anesthesia with the patient or authorized representative who has indicated his/her understanding and acceptance.     Plan Discussed with: CRNA and Surgeon  Anesthesia Plan Comments:        Anesthesia Quick Evaluation

## 2014-04-24 DIAGNOSIS — J9819 Other pulmonary collapse: Secondary | ICD-10-CM

## 2014-04-24 DIAGNOSIS — J9600 Acute respiratory failure, unspecified whether with hypoxia or hypercapnia: Secondary | ICD-10-CM

## 2014-04-24 LAB — GLUCOSE, CAPILLARY
GLUCOSE-CAPILLARY: 102 mg/dL — AB (ref 70–99)
Glucose-Capillary: 98 mg/dL (ref 70–99)
Glucose-Capillary: 104 mg/dL — ABNORMAL HIGH (ref 70–99)
Glucose-Capillary: 109 mg/dL — ABNORMAL HIGH (ref 70–99)
Glucose-Capillary: 114 mg/dL — ABNORMAL HIGH (ref 70–99)
Glucose-Capillary: 124 mg/dL — ABNORMAL HIGH (ref 70–99)

## 2014-04-24 LAB — BASIC METABOLIC PANEL
BUN: 23 mg/dL (ref 6–23)
CALCIUM: 8.3 mg/dL — AB (ref 8.4–10.5)
CO2: 21 mEq/L (ref 19–32)
CREATININE: 1.1 mg/dL (ref 0.50–1.35)
Chloride: 111 mEq/L (ref 96–112)
GFR calc non Af Amer: 67 mL/min — ABNORMAL LOW (ref >90)
GFR, EST AFRICAN AMERICAN: 78 mL/min — AB (ref >90)
Glucose, Bld: 101 mg/dL — ABNORMAL HIGH (ref 70–99)
Potassium: 3.6 mEq/L — ABNORMAL LOW (ref 3.7–5.3)
Sodium: 146 mEq/L (ref 137–147)

## 2014-04-24 LAB — CBC
HCT: 29.1 % — ABNORMAL LOW (ref 39.0–52.0)
Hemoglobin: 10 g/dL — ABNORMAL LOW (ref 13.0–17.0)
MCH: 31.1 pg (ref 26.0–34.0)
MCHC: 34.4 g/dL (ref 30.0–36.0)
MCV: 90.4 fL (ref 78.0–100.0)
PLATELETS: 176 10*3/uL (ref 150–400)
RBC: 3.22 MIL/uL — ABNORMAL LOW (ref 4.22–5.81)
RDW: 14 % (ref 11.5–15.5)
WBC: 10.2 10*3/uL (ref 4.0–10.5)

## 2014-04-24 LAB — POCT I-STAT 3, ART BLOOD GAS (G3+)
ACID-BASE DEFICIT: 5 mmol/L — AB (ref 0.0–2.0)
Bicarbonate: 19.6 mEq/L — ABNORMAL LOW (ref 20.0–24.0)
O2 SAT: 97 %
PO2 ART: 91 mmHg (ref 80.0–100.0)
Patient temperature: 98.6
TCO2: 21 mmol/L (ref 0–100)
pCO2 arterial: 31.5 mmHg — ABNORMAL LOW (ref 35.0–45.0)
pH, Arterial: 7.402 (ref 7.350–7.450)

## 2014-04-24 LAB — PHOSPHORUS: PHOSPHORUS: 2.5 mg/dL (ref 2.3–4.6)

## 2014-04-24 LAB — MAGNESIUM: Magnesium: 2.4 mg/dL (ref 1.5–2.5)

## 2014-04-24 MED ORDER — CARVEDILOL 25 MG PO TABS
25.0000 mg | ORAL_TABLET | Freq: Two times a day (BID) | ORAL | Status: DC
  Administered 2014-04-24 – 2014-05-03 (×18): 25 mg
  Filled 2014-04-24 (×21): qty 1

## 2014-04-24 MED ORDER — PANTOPRAZOLE SODIUM 40 MG PO PACK
40.0000 mg | PACK | Freq: Every day | ORAL | Status: DC
Start: 2014-04-24 — End: 2014-04-29
  Administered 2014-04-24 – 2014-04-28 (×5): 40 mg
  Filled 2014-04-24 (×6): qty 20

## 2014-04-24 MED ORDER — ATORVASTATIN CALCIUM 20 MG PO TABS
20.0000 mg | ORAL_TABLET | Freq: Every day | ORAL | Status: DC
  Administered 2014-04-24 – 2014-05-02 (×9): 20 mg
  Filled 2014-04-24 (×10): qty 1

## 2014-04-24 MED ORDER — ACETYLCYSTEINE 10 % IN SOLN: 4.0000 mL | Freq: Two times a day (BID) | RESPIRATORY_TRACT | Status: DC

## 2014-04-24 MED ORDER — MIDAZOLAM HCL 2 MG/2ML IJ SOLN
1.0000 mg | Freq: Once | INTRAMUSCULAR | Status: AC
  Administered 2014-04-24: 1 mg via INTRAVENOUS
  Filled 2014-04-24: qty 2

## 2014-04-24 MED ORDER — IPRATROPIUM-ALBUTEROL 0.5-2.5 (3) MG/3ML IN SOLN
3.0000 mL | Freq: Four times a day (QID) | RESPIRATORY_TRACT | Status: DC
  Administered 2014-04-24 – 2014-05-01 (×29): 3 mL via RESPIRATORY_TRACT
  Filled 2014-04-24 (×30): qty 3

## 2014-04-24 MED ORDER — DILTIAZEM 12 MG/ML ORAL SUSPENSION
30.0000 mg | Freq: Four times a day (QID) | ORAL | Status: DC
  Administered 2014-04-24 – 2014-04-25 (×4): 30 mg via ORAL
  Filled 2014-04-24 (×8): qty 3

## 2014-04-24 MED ORDER — ACETYLCYSTEINE 20 % IN SOLN
2.0000 mL | Freq: Two times a day (BID) | RESPIRATORY_TRACT | Status: AC
  Administered 2014-04-24 (×2): 2 mL via RESPIRATORY_TRACT
  Administered 2014-04-25 (×2): via RESPIRATORY_TRACT
  Administered 2014-04-26 (×2): 2 mL via RESPIRATORY_TRACT
  Filled 2014-04-24 (×6): qty 4

## 2014-04-24 MED ORDER — DOCUSATE SODIUM 50 MG/5ML PO LIQD
100.0000 mg | Freq: Every day | ORAL | Status: DC
  Administered 2014-04-24 – 2014-04-28 (×5): 100 mg via ORAL
  Filled 2014-04-24 (×6): qty 10

## 2014-04-24 NOTE — Progress Notes (Signed)
Physical Therapy Discharge Patient Details Name: Alejandro [name] MRN: [id number] DOB: 1946-08-14 Today's Date: 04/24/2014 Time:  -     Patient discharged from PT services secondary to medical decline - will need to re-order PT to resume therapy services.  Please see latest therapy progress note for current level of functioning and progress toward goals.    Progress and discharge plan discussed with patient and/or caregiver: Patient unable to participate in discharge planning and no caregivers available  [place] 04/24/2014, 8:33 AM   Antoine Poche, [place] DPT [phone]

## 2014-04-24 NOTE — Procedures (Signed)
Bronchoscopy Procedure Note Alejandro [name] [id number] 12/05/1945  Procedure: Bronchoscopy Indications: Diagnostic evaluation of the airways, Obtain specimens for culture and/or other diagnostic studies and Remove secretions  Procedure Details Consent: Risks of procedure as well as the alternatives and risks of each were explained to the (patient/caregiver).  Consent for procedure obtained. Time Out: Verified patient identification, verified procedure, site/side was marked, verified correct patient position, special equipment/implants available, medications/allergies/relevent history reviewed, required imaging and test results available.  Performed  In preparation for procedure, patient was given 100% FiO2 and bronchoscope lubricated. Sedation: Benzodiazepines  Airway entered and the following bronchi were examined: Bronchi.  Minimal secretions suctioned out Procedures performed: BAL Bronchoscope removed.  , Patient placed back on 100% FiO2 at conclusion of procedure.    Evaluation Hemodynamic Status: BP stable throughout; O2 sats: stable throughout Patient's Current Condition: stable Specimens:  Sent serosanguinous fluid Complications: No apparent complications Patient did tolerate procedure well.   Rigoberto Noel MD 04/24/2014

## 2014-04-24 NOTE — Progress Notes (Signed)
Patient ID: Alejandro [name], male   DOB: 1946/05/03, 68 y.o.   MRN: [id number] Subjective:  The patient is somnolent but arousable. I spoke with his wife.  Objective: Vital signs in last 24 hours: Temp:  [98 F (36.7 C)-100.2 F (37.9 C)] 100.2 F (37.9 C) (05/23 0753) Pulse Rate:  [69-116] 95 (05/23 1100) Resp:  [10-23] 14 (05/23 1100) BP: (124-210)/(57-112) 138/70 mmHg (05/23 1100) SpO2:  [93 %-100 %] 99 % (05/23 1100) Arterial Line BP: (158-229)/(59-102) 170/66 mmHg (05/23 1100) FiO2 (%):  [40 %-100 %] 40 % (05/23 1106) Weight:  [106.5 kg (234 lb 12.6 oz)] 106.5 kg (234 lb 12.6 oz) (05/23 0500)  Intake/Output from previous day: 05/22 0701 - 05/23 0700 In: 2562.8 [I.V.:2237.8; NG/GT:120; IV Piggyback:205] Out: 1910 [Urine:1810; Blood:100] Intake/Output this shift: Total I/O In: 246.3 [I.V.:246.3] Out: 225 [Urine:225]  Physical exam Glasgow Coma Scale 8 intubated ([id number]). The patient localizes to pain bilaterally. He is moving all 4 extremities well. His pupils are equal.  Lab Results:  Recent Labs  04/23/14 0245 04/24/14 0400  WBC 10.8* 10.2  HGB 10.0* 10.0*  HCT 29.3* 29.1*  PLT 174 176   BMET  Recent Labs  04/23/14 0245 04/24/14 0400  NA 140 146  K 3.2* 3.6*  CL 103 111  CO2 21 21  GLUCOSE 117* 101*  BUN 19 23  CREATININE 0.91 1.10  CALCIUM 8.7 8.3*    Studies/Results: Ct Head Wo Contrast  04/23/2014   CLINICAL DATA:  Followup after subdural hematoma evacuation. Patient anticoagulated.  EXAM: CT HEAD WITHOUT CONTRAST  TECHNIQUE: Contiguous axial images were obtained from the base of the skull through the vertex without contrast.  COMPARISON:  04/20/2014  FINDINGS: Continued improvement in the right subdural hematoma status post evacuation. Only minimal residual extra-axial fluid and air up to 5 mm thick.  Interval worsening of right temporal lobe intracerebral hematoma. A second component has developed in the more medial and posterior right temporal lobe 2.5  x 3.0 cm cross-section. Due to the location of this bleed, there is medial displacement of the uncus and hippocampus into the suprasellar cistern. Moderate worsening surrounding edema, not clearly vasogenic, could represent superimposed right temporal lobe MCA territory infarction with cytotoxic edema ( image 14 series 201). Recommend close clinical followup to exclude uncal herniation.  IMPRESSION: Continued improvement right subdural hematoma.  Worsening right temporal lobe intracerebral hematoma, possibly with superimposed temporal lobe infarct versus worsening vasogenic edema. Early uncal herniation.  Findings discussed with ordering provider.   Electronically Signed   By: Rolla Flatten M.D.   On: 04/23/2014 13:15   Dg Chest Port 1 View  04/23/2014   CLINICAL DATA:  Endotracheal tube placement.  Respiratory failure.  EXAM: PORTABLE CHEST - 1 VIEW  COMPARISON:  04/19/2014  FINDINGS: Endotracheal tube tip is approximately 2 cm above the carina. Feeding tube tip is below the diaphragm.  There is almost complete opacification of the left hemi thorax with a left pleural effusion. Heart and mediastinal structures are shifted to the left consistent with atelectasis in the left lung.  There is a focal linear atelectasis in the right upper lobe.  Pulmonary vascularity is normal.  No acute osseous abnormality.  IMPRESSION: New almost complete opacification of the left hemi thorax with a combination of atelectasis and pleural effusion.   Electronically Signed   By: Rozetta Nunnery M.D.   On: 04/23/2014 19:46   Dg Abd Portable 1v  04/22/2014   CLINICAL DATA:  Panda tube placement  EXAM: PORTABLE ABDOMEN - 1 VIEW  COMPARISON:  DG ABD PORTABLE 1V dated 04/22/2014  FINDINGS: Metallic feeding tube tip has been advanced and again projects in the region of the body of the stomach. Bowel gas pattern is normal.  IMPRESSION: Interval advancement of the enteric feeding tube tip again projecting in the region of the body of the  stomach.   Electronically Signed   By: Margaree Mackintosh M.D.   On: 04/22/2014 18:43    Assessment/Plan: Postop day 1: The patient is doing better neurologically. We will continue supportive care. We will plan to repeat his CAT scan in a couple days.  LOS: 5 days     [name] 04/24/2014, 11:45 AM

## 2014-04-24 NOTE — Progress Notes (Signed)
PULMONARY / CRITICAL CARE MEDICINE   Name: Alejandro [name] MRN: [id number] DOB: [birth year]    ADMISSION DATE:  04/19/2014 CONSULTATION DATE:  04/19/2014  REFERRING MD :  EDP PRIMARY SERVICE: PCCM  CHIEF COMPLAINT:  VDRF post-op crani for SDH  BRIEF PATIENT DESCRIPTION: 68 yo with AF on Coumadin admitted after fall.  Head CT revealed SDH with midline shift, and hemorrhagic contusion of the right temporal lobe. Underwent craniotomy and hematoma evacuation.  SIGNIFICANT EVENTS / STUDIES:  5/18  Head CT >>> SDH with midline shift, and hemorrhagic contusion of the right temporal lobe 5/18  OR >>> Craniotomy, hematoma evacuation 5/22 CT head>>> evolution of intraparenchymal hemorrhagic contusion or possibly venous infarction in the right temporal lobe with significant right temporal lobe swelling 5/22  OR>> Reexploration of right pterional craniotomy for partial temporal lobectomy and evacuation of right temporal hematoma with implantation of the bone flap in the right lateral abdominal wall cavity, remained intubated post op    LINES / TUBES: ETT 5/22>>>  CULTURES:  ANTIBIOTICS: Cefazolin 5/22>>>  INTERVAL HISTORY:  Back to OR late yesterday for reexploration and partial temporal lobectomy r/t evolution of ICH and worsening mental status.  Back on vent. Stable overnight.  Failed SBT.   VITAL SIGNS: Temp:  [98 F (36.7 C)-100.2 F (37.9 C)] 100.2 F (37.9 C) (05/23 0753) Pulse Rate:  [69-126] 94 (05/23 0800) Resp:  [10-28] 16 (05/23 0800) BP: (123-210)/(61-139) 167/97 mmHg (05/23 0800) SpO2:  [93 %-100 %] 99 % (05/23 0800) Arterial Line BP: (158-229)/(59-102) 201/75 mmHg (05/23 0800) FiO2 (%):  [40 %-100 %] 40 % (05/23 0755) Weight:  [234 lb 12.6 oz (106.5 kg)] 234 lb 12.6 oz (106.5 kg) (05/23 0500)  HEMODYNAMICS:   VENTILATOR SETTINGS: Vent Mode:  [-] PRVC FiO2 (%):  [40 %-100 %] 40 % Set Rate:  [14 bmp-16 bmp] 14 bmp Vt Set:  [650 mL] 650 mL PEEP:  [5 cmH20] 5  cmH20 Plateau Pressure:  [18 cmH20-23 cmH20] 18 cmH20 INTAKE / OUTPUT: Intake/Output     05/22 0701 - 05/23 0700 05/23 0701 - 05/24 0700   I.V. (mL/kg) 2147.8 (20.2)    NG/GT 120    IV Piggyback 205    Total Intake(mL/kg) 2472.8 (23.2)    Urine (mL/kg/hr) 1810 (0.7)    Blood 100 (0)    Total Output 1910     Net +562.8           PHYSICAL EXAMINATION: General: sedated on vent, NAD Neuro: sedated, RASS -1, opens eyes to voice, follows commands with BLE, no effort BUE HEENT: Surgical dressing intact, pupils pinpoint  Cardiovascular:  Irregular, no murmurs Lungs:  resps even non labored on vent, failed SBT r/t apnea, scattered rhonchi R, diminished L Abdomen:  Soft, bowel sounds present Musculoskeletal:  Trace edema Skin:  No rash  LABS:  CBC  Recent Labs Lab 04/21/14 0319 04/23/14 0245 04/24/14 0400  WBC 16.2* 10.8* 10.2  HGB 12.2* 10.0* 10.0*  HCT 35.7* 29.3* 29.1*  PLT 168 174 176   Coag's  Recent Labs Lab 04/20/14 0510 04/21/14 0319 04/22/14 0300  INR 1.25 1.02 1.21   BMET  Recent Labs Lab 04/21/14 0319 04/23/14 0245 04/24/14 0400  NA 135* 140 146  K 4.3 3.2* 3.6*  CL 101 103 111  CO2 22 21 21   BUN 13 19 23   CREATININE 0.95 0.91 1.10  GLUCOSE 129* 117* 101*   Electrolytes  Recent Labs Lab 04/20/14 0500 04/21/14 0319 04/23/14 0245 04/24/14 0400  CALCIUM 8.2* 8.8 8.7 8.3*  MG 1.8  --   --  2.4  PHOS 2.2*  --   --  2.5   Sepsis Markers No results found for this basename: LATICACIDVEN, PROCALCITON, O2SATVEN,  in the last 168 hours ABG  Recent Labs Lab 04/24/14 0446  PHART 7.402  PCO2ART 31.5*  PO2ART 91.0   Liver Enzymes  Recent Labs Lab 04/19/14 1315  AST 23  ALT 18  ALKPHOS 64  BILITOT 0.7  ALBUMIN 4.2   Cardiac Enzymes  Recent Labs Lab 04/19/14 1315  TROPONINI <0.30   Glucose  Recent Labs Lab 04/23/14 2050 04/23/14 2352 04/24/14 0324 04/24/14 0748  GLUCAP 109* 102* 98 104*    IMAGING:  Ct Head Wo  Contrast  04/23/2014   CLINICAL DATA:  Followup after subdural hematoma evacuation. Patient anticoagulated.  EXAM: CT HEAD WITHOUT CONTRAST  TECHNIQUE: Contiguous axial images were obtained from the base of the skull through the vertex without contrast.  COMPARISON:  04/20/2014  FINDINGS: Continued improvement in the right subdural hematoma status post evacuation. Only minimal residual extra-axial fluid and air up to 5 mm thick.  Interval worsening of right temporal lobe intracerebral hematoma. A second component has developed in the more medial and posterior right temporal lobe 2.5 x 3.0 cm cross-section. Due to the location of this bleed, there is medial displacement of the uncus and hippocampus into the suprasellar cistern. Moderate worsening surrounding edema, not clearly vasogenic, could represent superimposed right temporal lobe MCA territory infarction with cytotoxic edema ( image 14 series 201). Recommend close clinical followup to exclude uncal herniation.  IMPRESSION: Continued improvement right subdural hematoma.  Worsening right temporal lobe intracerebral hematoma, possibly with superimposed temporal lobe infarct versus worsening vasogenic edema. Early uncal herniation.  Findings discussed with ordering provider.   Electronically Signed   By: Rolla Flatten M.D.   On: 04/23/2014 13:15   Dg Chest Port 1 View  04/23/2014   CLINICAL DATA:  Endotracheal tube placement.  Respiratory failure.  EXAM: PORTABLE CHEST - 1 VIEW  COMPARISON:  04/19/2014  FINDINGS: Endotracheal tube tip is approximately 2 cm above the carina. Feeding tube tip is below the diaphragm.  There is almost complete opacification of the left hemi thorax with a left pleural effusion. Heart and mediastinal structures are shifted to the left consistent with atelectasis in the left lung.  There is a focal linear atelectasis in the right upper lobe.  Pulmonary vascularity is normal.  No acute osseous abnormality.  IMPRESSION: New almost  complete opacification of the left hemi thorax with a combination of atelectasis and pleural effusion.   Electronically Signed   By: Rozetta Nunnery M.D.   On: 04/23/2014 19:46   Dg Abd Portable 1v  04/22/2014   CLINICAL DATA:  Panda tube placement  EXAM: PORTABLE ABDOMEN - 1 VIEW  COMPARISON:  DG ABD PORTABLE 1V dated 04/22/2014  FINDINGS: Metallic feeding tube tip has been advanced and again projects in the region of the body of the stomach. Bowel gas pattern is normal.  IMPRESSION: Interval advancement of the enteric feeding tube tip again projecting in the region of the body of the stomach.   Electronically Signed   By: Margaree Mackintosh M.D.   On: 04/22/2014 18:43   Dg Abd Portable 1v  04/22/2014   CLINICAL DATA:  Feeding tube placement.  EXAM: PORTABLE ABDOMEN - 1 VIEW  COMPARISON:  None.  FINDINGS: Feeding tube tip is in the body of the stomach just below  the gastroesophageal junction. Bowel gas pattern is normal.  IMPRESSION: Feeding tube tip in the body of the stomach just below the gastroesophageal junction.   Electronically Signed   By: Rozetta Nunnery M.D.   On: 04/22/2014 11:36   ASSESSMENT / PLAN:  PULMONARY A:  VDRF - in setting [place] with worsening mental status and repeat OR for evolving hemorrhage Atx/collapse L hemithorax - suspect mucous plug (shift towards L) +/- some degree effusion  Smoker -likely COPD P:   Cont vent support  Daily SBT  Plan FOB - issues with secretions prior to intubation as well  ALbut/atrovent nebs Mucomyst & chest PT F/u CXR   NEUROLOGIC A:   SDH s/p evacuation ICH Hx ETOH  Delirium, alcoholic  P:   Per Neurosurgery Thiamine/Folate Fentanyl gtt  PRN versed   CARDIOVASCULAR A:  HTN AF, controlled rate P:  Labetalol PRN Cont home coreg Statin  Add low dose po cardizem - was on cardizem gtt prior to OR for RVR Hold ace while critically ill  Metoprolol PRN No anticoagulation in setting bleed   RENAL A:   No active issues P:   KVO IVF   Trend BMP Hold ace  Replete K prn  F/u mg, phos    GASTROINTESTINAL A:  Nutrition Constipation P:   TF colace  HEMATOLOGIC A:   Coumadin induced coagulopathy, reversed VTE Px P:  Likely not a candidate for any further anticoagulation SCD  INFECTIOUS A:   No active issues P:   No intervention required  ENDOCRINE A:   No active issues P:   No intervention required    Nickolas Madrid, NP 04/24/2014  9:15 AM Pager: [phone] or [phone]  *Care during the described time interval was provided by me and/or other providers on the critical care team. I have reviewed this patient's available data, including medical history, events of note, physical examination and test results as part of my evaluation.  Cc time x 35 m  Kara Mead MD. FCCP. [facility] Pager [phone] If no response call [phone]

## 2014-04-24 NOTE — Progress Notes (Signed)
Attempted CPT through bed as ordered, CPT starts and percussion can be heard, but is not adequate for pt to get full effect. Will continue to attempt CPT, will notify MD if unable to correct problem to order another option. RT will continue to monitor.

## 2014-04-25 ENCOUNTER — Inpatient Hospital Stay (HOSPITAL_COMMUNITY): Payer: BC Managed Care – PPO

## 2014-04-25 LAB — CBC
HCT: 29.1 % — ABNORMAL LOW (ref 39.0–52.0)
Hemoglobin: 9.8 g/dL — ABNORMAL LOW (ref 13.0–17.0)
MCH: 30.7 pg (ref 26.0–34.0)
MCHC: 33.7 g/dL (ref 30.0–36.0)
MCV: 91.2 fL (ref 78.0–100.0)
Platelets: 172 10*3/uL (ref 150–400)
RBC: 3.19 MIL/uL — ABNORMAL LOW (ref 4.22–5.81)
RDW: 14.2 % (ref 11.5–15.5)
WBC: 9.1 10*3/uL (ref 4.0–10.5)

## 2014-04-25 LAB — BASIC METABOLIC PANEL
BUN: 20 mg/dL (ref 6–23)
CHLORIDE: 113 meq/L — AB (ref 96–112)
CO2: 25 mEq/L (ref 19–32)
Calcium: 8.4 mg/dL (ref 8.4–10.5)
Creatinine, Ser: 1.03 mg/dL (ref 0.50–1.35)
GFR calc non Af Amer: 73 mL/min — ABNORMAL LOW (ref >90)
GFR, EST AFRICAN AMERICAN: 84 mL/min — AB (ref >90)
Glucose, Bld: 142 mg/dL — ABNORMAL HIGH (ref 70–99)
POTASSIUM: 3.7 meq/L (ref 3.7–5.3)
SODIUM: 147 meq/L (ref 137–147)

## 2014-04-25 LAB — GLUCOSE, CAPILLARY
GLUCOSE-CAPILLARY: 122 mg/dL — AB (ref 70–99)
GLUCOSE-CAPILLARY: 135 mg/dL — AB (ref 70–99)
GLUCOSE-CAPILLARY: 152 mg/dL — AB (ref 70–99)
Glucose-Capillary: 159 mg/dL — ABNORMAL HIGH (ref 70–99)
Glucose-Capillary: 174 mg/dL — ABNORMAL HIGH (ref 70–99)
Glucose-Capillary: 138 mg/dL — ABNORMAL HIGH (ref 70–99)

## 2014-04-25 LAB — PHOSPHORUS: Phosphorus: 2 mg/dL — ABNORMAL LOW (ref 2.3–4.6)

## 2014-04-25 LAB — MAGNESIUM: MAGNESIUM: 2.5 mg/dL (ref 1.5–2.5)

## 2014-04-25 MED ORDER — INSULIN ASPART 100 UNIT/ML ~~LOC~~ SOLN
0.0000 [IU] | SUBCUTANEOUS | Status: DC
  Administered 2014-04-25: 2 [IU] via SUBCUTANEOUS
  Administered 2014-04-25 – 2014-04-26 (×2): 3 [IU] via SUBCUTANEOUS
  Administered 2014-04-26 (×4): 2 [IU] via SUBCUTANEOUS
  Administered 2014-04-26: 3 [IU] via SUBCUTANEOUS
  Administered 2014-04-27 – 2014-04-28 (×9): 2 [IU] via SUBCUTANEOUS
  Administered 2014-04-28: 3 [IU] via SUBCUTANEOUS
  Administered 2014-04-28 – 2014-04-29 (×3): 2 [IU] via SUBCUTANEOUS
  Administered 2014-04-29: 3 [IU] via SUBCUTANEOUS
  Administered 2014-04-29: 2 [IU] via SUBCUTANEOUS
  Administered 2014-04-29: 5 [IU] via SUBCUTANEOUS
  Administered 2014-04-30 (×4): 3 [IU] via SUBCUTANEOUS
  Administered 2014-04-30: 2 [IU] via SUBCUTANEOUS
  Administered 2014-04-30 – 2014-05-01 (×2): 5 [IU] via SUBCUTANEOUS
  Administered 2014-05-01 (×2): 3 [IU] via SUBCUTANEOUS
  Administered 2014-05-01 (×2): 2 [IU] via SUBCUTANEOUS
  Administered 2014-05-01: 3 [IU] via SUBCUTANEOUS
  Administered 2014-05-02: 2 [IU] via SUBCUTANEOUS
  Administered 2014-05-02: 3 [IU] via SUBCUTANEOUS
  Administered 2014-05-02 (×3): 2 [IU] via SUBCUTANEOUS
  Administered 2014-05-02 – 2014-05-03 (×2): 3 [IU] via SUBCUTANEOUS
  Administered 2014-05-03: 5 [IU] via SUBCUTANEOUS
  Administered 2014-05-03: 3 [IU] via SUBCUTANEOUS
  Administered 2014-05-03: 2 [IU] via SUBCUTANEOUS

## 2014-04-25 MED ORDER — POTASSIUM CHLORIDE 20 MEQ/15ML (10%) PO LIQD
20.0000 meq | ORAL | Status: DC
  Administered 2014-04-25: 20 meq
  Filled 2014-04-25: qty 15

## 2014-04-25 MED ORDER — POTASSIUM PHOSPHATES 15 MMOLE/5ML IV SOLN
20.0000 mmol | Freq: Once | INTRAVENOUS | Status: AC
  Administered 2014-04-25: 20 mmol via INTRAVENOUS
  Filled 2014-04-25: qty 6.67

## 2014-04-25 MED ORDER — DILTIAZEM 12 MG/ML ORAL SUSPENSION
30.0000 mg | Freq: Three times a day (TID) | ORAL | Status: DC
  Administered 2014-04-25 – 2014-04-28 (×9): 30 mg via ORAL
  Filled 2014-04-25 (×12): qty 3

## 2014-04-25 NOTE — Progress Notes (Signed)
[facility] Progress Note Patient Name: Alejandro [name] DOB: 1946/03/23 MRN: [id number]  Date of Service  04/25/2014   HPI/Events of Note   Hyperglycemia   eICU Interventions   SSI   Intervention Category Intermediate Interventions: Hyperglycemia - evaluation and treatment  Doree Fudge 04/25/2014, 3:43 PM

## 2014-04-25 NOTE — Progress Notes (Signed)
[facility] ADULT ICU REPLACEMENT PROTOCOL FOR AM LAB REPLACEMENT ONLY  The patient does apply for the [facility] Adult ICU Electrolyte Replacment Protocol based on the criteria listed below:   1. Is GFR >/= 40 ml/min? yes  Patient's GFR today is 73 2. Is urine output >/= 0.5 ml/kg/hr for the last 6 hours? yes Patient's UOP is 0.7 ml/kg/hr 3. Is BUN < 60 mg/dL? yes  Patient's BUN today is 45 4. Abnormal electrolyte(s): K3.7 5. Ordered repletion with: [id number] 6. If a panic level lab has been reported, has the CCM MD in charge been notified? yes.   Physician:  Pamala Hurry 04/25/2014 5:59 AM

## 2014-04-25 NOTE — Progress Notes (Addendum)
error 

## 2014-04-25 NOTE — Progress Notes (Signed)
Subjective: Patient continues on ventilator via ETT.  Objective: Vital signs in last 24 hours: Filed Vitals:   04/25/14 0736 04/25/14 0756 04/25/14 0800 04/25/14 0900  BP:   135/58 122/56  Pulse:   68 85  Temp:   100.2 F (37.9 C)   TempSrc:   Oral   Resp:   14 19  Height:      Weight:      SpO2: 97% 97% 97% 96%    Intake/Output from previous day: 05/23 0701 - 05/24 0700 In: 1440.9 [I.V.:688.3; NG/GT:602.6; IV Piggyback:150] Out: 1160 [Urine:1160] Intake/Output this shift: Total I/O In: 122.5 [I.V.:32.5; NG/GT:90] Out: -   Physical Exam:  Opening eyes weakly to voice. Following simple commands with extremities. Pupils 2 mm, round, sluggishly reactive to light. Dressing is clean and dry.  CBC  Recent Labs  04/24/14 0400 04/25/14 0430  WBC 10.2 9.1  HGB 10.0* 9.8*  HCT 29.1* 29.1*  PLT 176 172   BMET  Recent Labs  04/24/14 0400 04/25/14 0430  NA 146 147  K 3.6* 3.7  CL 111 113*  CO2 21 25  GLUCOSE 101* 142*  BUN 23 20  CREATININE 1.10 1.03  CALCIUM 8.3* 8.4   ABG    Component Value Date/Time   PHART 7.402 04/24/2014 0446   PCO2ART 31.5* 04/24/2014 0446   PO2ART 91.0 04/24/2014 0446   HCO3 19.6* 04/24/2014 0446   TCO2 21 04/24/2014 0446   ACIDBASEDEF 5.0* 04/24/2014 0446   O2SAT 97.0 04/24/2014 0446    Studies/Results: Ct Head Wo Contrast  04/23/2014   CLINICAL DATA:  Followup after subdural hematoma evacuation. Patient anticoagulated.  EXAM: CT HEAD WITHOUT CONTRAST  TECHNIQUE: Contiguous axial images were obtained from the base of the skull through the vertex without contrast.  COMPARISON:  04/20/2014  FINDINGS: Continued improvement in the right subdural hematoma status post evacuation. Only minimal residual extra-axial fluid and air up to 5 mm thick.  Interval worsening of right temporal lobe intracerebral hematoma. A second component has developed in the more medial and posterior right temporal lobe 2.5 x 3.0 cm cross-section. Due to the location of  this bleed, there is medial displacement of the uncus and hippocampus into the suprasellar cistern. Moderate worsening surrounding edema, not clearly vasogenic, could represent superimposed right temporal lobe MCA territory infarction with cytotoxic edema ( image 14 series 201). Recommend close clinical followup to exclude uncal herniation.  IMPRESSION: Continued improvement right subdural hematoma.  Worsening right temporal lobe intracerebral hematoma, possibly with superimposed temporal lobe infarct versus worsening vasogenic edema. Early uncal herniation.  Findings discussed with ordering provider.   Electronically Signed   By: Rolla Flatten M.D.   On: 04/23/2014 13:15   Dg Chest Portable 1 View  04/25/2014   CLINICAL DATA:  68 year old male with subdural hematoma. Respiratory failure and difficulty.  EXAM: PORTABLE CHEST - 1 VIEW  COMPARISON:  04/23/2014 and prior chest radiographs  FINDINGS: Cardiomegaly and prior cardiac surgical changes noted.  An endotracheal tube is is identified with tip 3.5 cm above the carina.  A small bore feeding tube is again identified entering the stomach with tip off the field of view.  Significantly improved left lung aeration is noted compatible with significant improved atelectasis. Persistent left lower lung opacity is noted and may represent residual atelectasis or airspace disease/pneumonia.  Mild pulmonary vascular congestion is noted.  Mild right basilar atelectasis is identified.  There is no evident thorax.  IMPRESSION: Significantly improved aeration within the left lung compatible  with improved left lung atelectasis. Persistent left lower lung opacity may represent atelectasis or airspace disease pneumonia. .   Electronically Signed   By: Hassan Rowan M.D.   On: 04/25/2014 09:44   Dg Chest Port 1 View  04/23/2014   CLINICAL DATA:  Endotracheal tube placement.  Respiratory failure.  EXAM: PORTABLE CHEST - 1 VIEW  COMPARISON:  04/19/2014  FINDINGS: Endotracheal tube tip  is approximately 2 cm above the carina. Feeding tube tip is below the diaphragm.  There is almost complete opacification of the left hemi thorax with a left pleural effusion. Heart and mediastinal structures are shifted to the left consistent with atelectasis in the left lung.  There is a focal linear atelectasis in the right upper lobe.  Pulmonary vascularity is normal.  No acute osseous abnormality.  IMPRESSION: New almost complete opacification of the left hemi thorax with a combination of atelectasis and pleural effusion.   Electronically Signed   By: Rozetta Nunnery M.D.   On: 04/23/2014 19:46    Assessment/Plan: Gradually improving neurologically.  Continue current supportive care.   Hosie Spangle, MD 04/25/2014, 10:07 AM

## 2014-04-25 NOTE — Progress Notes (Signed)
PULMONARY / CRITICAL CARE MEDICINE   Name: Alejandro [name] MRN: [id number] DOB: [birth year]    ADMISSION DATE:  04/19/2014 CONSULTATION DATE:  04/19/2014  REFERRING MD :  EDP PRIMARY SERVICE: PCCM  CHIEF COMPLAINT:  VDRF post-op crani for SDH  BRIEF PATIENT DESCRIPTION: 68 yo with AF on Coumadin admitted after fall.  Head CT revealed SDH with midline shift, and hemorrhagic contusion of the right temporal lobe. Underwent craniotomy and hematoma evacuation.  SIGNIFICANT EVENTS / STUDIES:  5/18  Head CT >>> SDH with midline shift, and hemorrhagic contusion of the right temporal lobe 5/18  OR >>> Craniotomy, hematoma evacuation 5/22 CT head>>> evolution of intraparenchymal hemorrhagic contusion or possibly venous infarction in the right temporal lobe with significant right temporal lobe swelling 5/22  OR>> Reexploration of right pterional craniotomy for partial temporal lobectomy and evacuation of right temporal hematoma with implantation of the bone flap in the right lateral abdominal wall cavity, remained intubated post op  5/23 FOB>>>minimal secretions   LINES / TUBES: ETT 5/22>>>  CULTURES: BAL 5/23>>>  ANTIBIOTICS: Cefazolin 5/22>>>  INTERVAL HISTORY:   No acute change.  Weaning on PS 10/5 Low grade fevers+  VITAL SIGNS: Temp:  [100 F (37.8 C)-101.1 F (38.4 C)] 100.2 F (37.9 C) (05/24 0800) Pulse Rate:  [56-102] 68 (05/24 0800) Resp:  [13-24] 14 (05/24 0800) BP: (99-179)/(34-72) 135/58 mmHg (05/24 0800) SpO2:  [96 %-100 %] 97 % (05/24 0800) Arterial Line BP: (120-173)/(45-66) 171/62 mmHg (05/24 0800) FiO2 (%):  [40 %] 40 % (05/24 0756) Weight:  [233 lb 7.5 oz (105.9 kg)] 233 lb 7.5 oz (105.9 kg) (05/24 0428)  HEMODYNAMICS:   VENTILATOR SETTINGS: Vent Mode:  [-] PSV FiO2 (%):  [40 %] 40 % Set Rate:  [14 bmp] 14 bmp Vt Set:  [650 mL] 650 mL PEEP:  [5 cmH20] 5 cmH20 Pressure Support:  [10 cmH20] 10 cmH20 Plateau Pressure:  [16 cmH20-21 cmH20] 16 cmH20 INTAKE /  OUTPUT: Intake/Output     05/23 0701 - 05/24 0700 05/24 0701 - 05/25 0700   I.V. (mL/kg) 688.3 (6.5) 17.5 (0.2)   NG/GT 602.6 45   IV Piggyback 150    Total Intake(mL/kg) 1440.9 (13.6) 62.5 (0.6)   Urine (mL/kg/hr) 1160 (0.5)    Blood     Total Output 1160     Net +280.9 +62.5         PHYSICAL EXAMINATION: General: sedated on vent, NAD Neuro: sedated, RASS -1 on fent gtt, opens eyes to voice, follows commands with BLE, LUE, weak RUE HEENT: Surgical dressing intact, pupils pinpoint  Cardiovascular:  Irregular, no murmurs Lungs:  resps even non labored on vent, tol PS wean, scattered rhonchi R, diminished L but improved  Abdomen:  Soft, bowel sounds present Musculoskeletal:  Trace edema Skin:  No rash  LABS:  CBC  Recent Labs Lab 04/23/14 0245 04/24/14 0400 04/25/14 0430  WBC 10.8* 10.2 9.1  HGB 10.0* 10.0* 9.8*  HCT 29.3* 29.1* 29.1*  PLT 174 176 172   Coag's  Recent Labs Lab 04/20/14 0510 04/21/14 0319 04/22/14 0300  INR 1.25 1.02 1.21   BMET  Recent Labs Lab 04/23/14 0245 04/24/14 0400 04/25/14 0430  NA 140 146 147  K 3.2* 3.6* 3.7  CL 103 111 113*  CO2 21 21 25   BUN 19 23 20   CREATININE 0.91 1.10 1.03  GLUCOSE 117* 101* 142*   Electrolytes  Recent Labs Lab 04/20/14 0500  04/23/14 0245 04/24/14 0400 04/25/14 0430  CALCIUM 8.2*  < >  8.7 8.3* 8.4  MG 1.8  --   --  2.4 2.5  PHOS 2.2*  --   --  2.5 2.0*  < > = values in this interval not displayed. Sepsis Markers No results found for this basename: LATICACIDVEN, PROCALCITON, O2SATVEN,  in the last 168 hours ABG  Recent Labs Lab 04/24/14 0446  PHART 7.402  PCO2ART 31.5*  PO2ART 91.0   Liver Enzymes  Recent Labs Lab 04/19/14 1315  AST 23  ALT 18  ALKPHOS 64  BILITOT 0.7  ALBUMIN 4.2   Cardiac Enzymes  Recent Labs Lab 04/19/14 1315  TROPONINI <0.30   Glucose  Recent Labs Lab 04/24/14 1152 04/24/14 1558 04/24/14 2045 04/25/14 0027 04/25/14 0403 04/25/14 0732   GLUCAP 109* 114* 124* 122* 135* 152*    IMAGING:  Ct Head Wo Contrast  04/23/2014   CLINICAL DATA:  Followup after subdural hematoma evacuation. Patient anticoagulated.  EXAM: CT HEAD WITHOUT CONTRAST  TECHNIQUE: Contiguous axial images were obtained from the base of the skull through the vertex without contrast.  COMPARISON:  04/20/2014  FINDINGS: Continued improvement in the right subdural hematoma status post evacuation. Only minimal residual extra-axial fluid and air up to 5 mm thick.  Interval worsening of right temporal lobe intracerebral hematoma. A second component has developed in the more medial and posterior right temporal lobe 2.5 x 3.0 cm cross-section. Due to the location of this bleed, there is medial displacement of the uncus and hippocampus into the suprasellar cistern. Moderate worsening surrounding edema, not clearly vasogenic, could represent superimposed right temporal lobe MCA territory infarction with cytotoxic edema ( image 14 series 201). Recommend close clinical followup to exclude uncal herniation.  IMPRESSION: Continued improvement right subdural hematoma.  Worsening right temporal lobe intracerebral hematoma, possibly with superimposed temporal lobe infarct versus worsening vasogenic edema. Early uncal herniation.  Findings discussed with ordering provider.   Electronically Signed   By: Rolla Flatten M.D.   On: 04/23/2014 13:15   Dg Chest Port 1 View  04/23/2014   CLINICAL DATA:  Endotracheal tube placement.  Respiratory failure.  EXAM: PORTABLE CHEST - 1 VIEW  COMPARISON:  04/19/2014  FINDINGS: Endotracheal tube tip is approximately 2 cm above the carina. Feeding tube tip is below the diaphragm.  There is almost complete opacification of the left hemi thorax with a left pleural effusion. Heart and mediastinal structures are shifted to the left consistent with atelectasis in the left lung.  There is a focal linear atelectasis in the right upper lobe.  Pulmonary vascularity is  normal.  No acute osseous abnormality.  IMPRESSION: New almost complete opacification of the left hemi thorax with a combination of atelectasis and pleural effusion.   Electronically Signed   By: Rozetta Nunnery M.D.   On: 04/23/2014 19:46   ASSESSMENT / PLAN:  PULMONARY A:  VDRF - in setting ICH with worsening mental status and repeat OR for evolving hemorrhage Atx/collapse L hemithorax -  Improved post bscopy 5/23 Smoker -likely COPD P:   Cont vent support  Daily SBT  Albut/atrovent nebs Mucomyst & chest PT   NEUROLOGIC A:   SDH s/p evacuation ICH Hx ETOH  Delirium, alcoholic - improving  P:   Per Neurosurgery Thiamine/Folate Fentanyl gtt - wean as able  PRN versed   CARDIOVASCULAR A:  HTN AF, controlled rate P:  Labetalol PRN Cont home coreg Statin  PO cardizem  Hold ace while critically ill  Metoprolol PRN No anticoagulation in setting bleed   RENAL  A:   Hypokalemia  Hypophosphatemia  P:   KVO IVF  Trend BMP Hold ace  Replete K prn  replte phos 5/24 F/u mg, phos    GASTROINTESTINAL A:  Nutrition Constipation P:   TF colace  HEMATOLOGIC A:   Coumadin induced coagulopathy, reversed VTE Px P:  Likely not a candidate for any further anticoagulation SCD  INFECTIOUS A:   Fevers  P:   Trend fever, wbc curve  F/u BAL   ENDOCRINE A:   No active issues P:   No intervention required   Nickolas Madrid, NP 04/25/2014  8:43 AM Pager: [phone] or [phone]  *Care during the described time interval was provided by me and/or other providers on the critical care team. I have reviewed this patient's available data, including medical history, events of note, physical examination and test results as part of my evaluation.  The patient is critically ill with multiple organ systems failure and requires high complexity decision making for assessment and support, frequent evaluation and titration of therapies, application of advanced  monitoring technologies and extensive interpretation of multiple databases. Critical Care Time devoted to patient care services described in this note is 35 minutes.    Kara Mead MD. [place]. [place] [facility] Pager [phone] If no response call [phone]

## 2014-04-26 ENCOUNTER — Inpatient Hospital Stay (HOSPITAL_COMMUNITY): Payer: BC Managed Care – PPO

## 2014-04-26 LAB — CBC
HEMATOCRIT: 29.8 % — AB (ref 39.0–52.0)
HEMOGLOBIN: 10 g/dL — AB (ref 13.0–17.0)
MCH: 30.8 pg (ref 26.0–34.0)
MCHC: 33.6 g/dL (ref 30.0–36.0)
MCV: 91.7 fL (ref 78.0–100.0)
Platelets: 186 10*3/uL (ref 150–400)
RBC: 3.25 MIL/uL — ABNORMAL LOW (ref 4.22–5.81)
RDW: 14.2 % (ref 11.5–15.5)
WBC: 10.1 10*3/uL (ref 4.0–10.5)

## 2014-04-26 LAB — MAGNESIUM: Magnesium: 2.4 mg/dL (ref 1.5–2.5)

## 2014-04-26 LAB — TRIGLYCERIDES: Triglycerides: 76 mg/dL (ref <150)

## 2014-04-26 LAB — BASIC METABOLIC PANEL
BUN: 18 mg/dL (ref 6–23)
CO2: 24 mEq/L (ref 19–32)
CREATININE: 0.92 mg/dL (ref 0.50–1.35)
Calcium: 8.5 mg/dL (ref 8.4–10.5)
Chloride: 112 mEq/L (ref 96–112)
GFR calc Af Amer: >90 mL/min (ref >90)
GFR calc non Af Amer: 85 mL/min — ABNORMAL LOW (ref >90)
GLUCOSE: 180 mg/dL — AB (ref 70–99)
Potassium: 3.8 mEq/L (ref 3.7–5.3)
Sodium: 145 mEq/L (ref 137–147)

## 2014-04-26 LAB — GLUCOSE, CAPILLARY
Glucose-Capillary: 129 mg/dL — ABNORMAL HIGH (ref 70–99)
Glucose-Capillary: 139 mg/dL — ABNORMAL HIGH (ref 70–99)
Glucose-Capillary: 144 mg/dL — ABNORMAL HIGH (ref 70–99)
Glucose-Capillary: 145 mg/dL — ABNORMAL HIGH (ref 70–99)
Glucose-Capillary: 151 mg/dL — ABNORMAL HIGH (ref 70–99)
Glucose-Capillary: 157 mg/dL — ABNORMAL HIGH (ref 70–99)

## 2014-04-26 LAB — CULTURE, BAL-QUANTITATIVE

## 2014-04-26 LAB — CULTURE, BAL-QUANTITATIVE W GRAM STAIN

## 2014-04-26 LAB — PHOSPHORUS: Phosphorus: 1.9 mg/dL — ABNORMAL LOW (ref 2.3–4.6)

## 2014-04-26 MED ORDER — FENTANYL CITRATE 0.05 MG/ML IJ SOLN: 50.0000 ug | INTRAMUSCULAR | Status: DC | PRN

## 2014-04-26 MED ORDER — POTASSIUM PHOSPHATES 15 MMOLE/5ML IV SOLN
20.0000 mmol | Freq: Once | INTRAVENOUS | Status: AC
  Administered 2014-04-26: 20 mmol via INTRAVENOUS
  Filled 2014-04-26: qty 6.67

## 2014-04-26 MED ORDER — PROPOFOL 10 MG/ML IV EMUL
0.0000 ug/kg/min | INTRAVENOUS | Status: DC
Start: 2014-04-26 — End: 2014-05-01
  Administered 2014-04-26: 5 ug/kg/min via INTRAVENOUS
  Administered 2014-04-27 (×3): 15 ug/kg/min via INTRAVENOUS
  Administered 2014-04-28: 30 ug/kg/min via INTRAVENOUS
  Administered 2014-04-28: 40 ug/kg/min via INTRAVENOUS
  Filled 2014-04-26 (×6): qty 100

## 2014-04-26 MED ORDER — LEVETIRACETAM 100 MG/ML PO SOLN
500.0000 mg | Freq: Two times a day (BID) | ORAL | Status: DC
  Administered 2014-04-26 – 2014-05-04 (×17): 500 mg via ORAL
  Filled 2014-04-26 (×18): qty 5

## 2014-04-26 NOTE — Progress Notes (Signed)
Subjective: Patient continues on ventilator via a PTT. Sedated with fentanyl drip.  Objective: Vital signs in last 24 hours: Filed Vitals:   04/25/14 2200 04/25/14 2300 04/25/14 2344 04/26/14 0000  BP: 151/71 137/65 158/60 148/64  Pulse: 101 53 85 88  Temp:   99.6 F (37.6 C)   TempSrc:   Axillary   Resp: 13 12 14 15   Height:      Weight:      SpO2: 98% 97% 97% 99%    Intake/Output from previous day: 05/24 0701 - 05/25 0700 In: 1395.3 [I.V.:270.3; NG/GT:1025; IV Piggyback:100] Out: 585 [Urine:585] Intake/Output this shift: Total I/O In: 592.5 [I.V.:87.5; NG/GT:455; IV Piggyback:50] Out: 385 [Urine:385]  Physical Exam:  Opening eyes to voice. Following commands briskly with all 4 extremities. Dressings clean and dry.  CBC  Recent Labs  04/24/14 0400 04/25/14 0430  WBC 10.2 9.1  HGB 10.0* 9.8*  HCT 29.1* 29.1*  PLT 176 172   BMET  Recent Labs  04/24/14 0400 04/25/14 0430  NA 146 147  K 3.6* 3.7  CL 111 113*  CO2 21 25  GLUCOSE 101* 142*  BUN 23 20  CREATININE 1.10 1.03  CALCIUM 8.3* 8.4   ABG    Component Value Date/Time   PHART 7.402 04/24/2014 0446   PCO2ART 31.5* 04/24/2014 0446   PO2ART 91.0 04/24/2014 0446   HCO3 19.6* 04/24/2014 0446   TCO2 21 04/24/2014 0446   ACIDBASEDEF 5.0* 04/24/2014 0446   O2SAT 97.0 04/24/2014 0446    Studies/Results: Dg Chest Portable 1 View  04/25/2014   CLINICAL DATA:  68 year old male with subdural hematoma. Respiratory failure and difficulty.  EXAM: PORTABLE CHEST - 1 VIEW  COMPARISON:  04/23/2014 and prior chest radiographs  FINDINGS: Cardiomegaly and prior cardiac surgical changes noted.  An endotracheal tube is is identified with tip 3.5 cm above the carina.  A small bore feeding tube is again identified entering the stomach with tip off the field of view.  Significantly improved left lung aeration is noted compatible with significant improved atelectasis. Persistent left lower lung opacity is noted and may represent  residual atelectasis or airspace disease/pneumonia.  Mild pulmonary vascular congestion is noted.  Mild right basilar atelectasis is identified.  There is no evident thorax.  IMPRESSION: Significantly improved aeration within the left lung compatible with improved left lung atelectasis. Persistent left lower lung opacity may represent atelectasis or airspace disease pneumonia. .   Electronically Signed   By: Hassan Rowan M.D.   On: 04/25/2014 09:44    Assessment/Plan: Continuing to improve. Continuing supportive care.   Hosie Spangle, MD 04/26/2014, 1:17 AM

## 2014-04-26 NOTE — Progress Notes (Signed)
PULMONARY / CRITICAL CARE MEDICINE   Name: Alejandro [name] MRN: [id number] DOB: 1946/05/14    ADMISSION DATE:  04/19/2014 CONSULTATION DATE:  04/19/2014  REFERRING MD :  EDP PRIMARY SERVICE: PCCM  CHIEF COMPLAINT:  VDRF post-op crani for SDH  BRIEF PATIENT DESCRIPTION: 68 yo with AF on Coumadin admitted after fall.  Head CT revealed SDH with midline shift, and hemorrhagic contusion of the right temporal lobe. Underwent craniotomy and hematoma evacuation.  SIGNIFICANT EVENTS / STUDIES:  5/18  Head CT >>> SDH with midline shift, and hemorrhagic contusion of the right temporal lobe 5/18  OR >>> Craniotomy, hematoma evacuation 5/22 CT head>>> evolution of intraparenchymal hemorrhagic contusion or possibly venous infarction in the right temporal lobe with significant right temporal lobe swelling 5/22  OR>> Reexploration of right pterional craniotomy for partial temporal lobectomy and evacuation of right temporal hematoma with implantation of the bone flap in the right lateral abdominal wall cavity, remained intubated post op  5/23 FOB>>>minimal secretions   LINES / TUBES: ETT 5/22>>>  CULTURES: BAL 5/23>>>  ANTIBIOTICS: Cefazolin 5/22>>>  INTERVAL HISTORY:   No acute change.  Cont intermittent agitation, on fent gtt.   Fevers improved.    VITAL SIGNS: Temp:  [97.8 F (36.6 C)-100.1 F (37.8 C)] 99.5 F (37.5 C) (05/25 0800) Pulse Rate:  [53-111] 71 (05/25 0843) Resp:  [12-19] 16 (05/25 0843) BP: (120-170)/(50-75) 144/62 mmHg (05/25 0843) SpO2:  [95 %-100 %] 97 % (05/25 0843) Arterial Line BP: (153-186)/(47-65) 155/51 mmHg (05/25 0800) FiO2 (%):  [30 %-40 %] 40 % (05/25 0843) Weight:  [234 lb 12.6 oz (106.5 kg)] 234 lb 12.6 oz (106.5 kg) (05/25 0400)  HEMODYNAMICS:   VENTILATOR SETTINGS: Vent Mode:  [-] CPAP;PSV FiO2 (%):  [30 %-40 %] 40 % Set Rate:  [14 bmp] 14 bmp Vt Set:  [650 mL] 650 mL PEEP:  [5 cmH20] 5 cmH20 Pressure Support:  [10 cmH20] 10 cmH20 Plateau  Pressure:  [11 cmH20-17 cmH20] 17 cmH20 INTAKE / OUTPUT: Intake/Output     05/24 0701 - 05/25 0700 05/25 0701 - 05/26 0700   I.V. (mL/kg) 375.3 (3.5) 22.3 (0.2)   NG/GT 1455 125   IV Piggyback 100    Total Intake(mL/kg) 1930.3 (18.1) 147.3 (1.4)   Urine (mL/kg/hr) 995 (0.4) 60 (0.3)   Total Output 995 60   Net +935.3 +87.3         PHYSICAL EXAMINATION: General: sedated on vent, NAD Neuro: sedated, RASS -1 on fent gtt, opens eyes to voice, follows commands with BLE, LUE, weak RUE, easily agitated per nsg HEENT: Surgical dressing intact, pupils pinpoint  Cardiovascular:  Irregular, no murmurs Lungs:  resps even non labored on vent, scattered rhonchi   Abdomen:  Soft, bowel sounds present Musculoskeletal:  Trace edema Skin:  No rash  LABS:  CBC  Recent Labs Lab 04/24/14 0400 04/25/14 0430 04/26/14 0500  WBC 10.2 9.1 10.1  HGB 10.0* 9.8* 10.0*  HCT 29.1* 29.1* 29.8*  PLT 176 172 186   Coag's  Recent Labs Lab 04/20/14 0510 04/21/14 0319 04/22/14 0300  INR 1.25 1.02 1.21   BMET  Recent Labs Lab 04/24/14 0400 04/25/14 0430 04/26/14 0500  NA 146 147 145  K 3.6* 3.7 3.8  CL 111 113* 112  CO2 21 25 24   BUN 23 20 18   CREATININE 1.10 1.03 0.92  GLUCOSE 101* 142* 180*   Electrolytes  Recent Labs Lab 04/24/14 0400 04/25/14 0430 04/26/14 0500  CALCIUM 8.3* 8.4 8.5  MG  2.4 2.5 2.4  PHOS 2.5 2.0* 1.9*   Sepsis Markers No results found for this basename: LATICACIDVEN, PROCALCITON, O2SATVEN,  in the last 168 hours ABG  Recent Labs Lab 04/24/14 0446  PHART 7.402  PCO2ART 31.5*  PO2ART 91.0   Liver Enzymes  Recent Labs Lab 04/19/14 1315  AST 23  ALT 18  ALKPHOS 64  BILITOT 0.7  ALBUMIN 4.2   Cardiac Enzymes  Recent Labs Lab 04/19/14 1315  TROPONINI <0.30   Glucose  Recent Labs Lab 04/25/14 1204 04/25/14 1535 04/25/14 2010 04/26/14 0015 04/26/14 0401 04/26/14 0812  GLUCAP 159* 174* 138* 157* 145* 144*    IMAGING:  Dg Chest  Portable 1 View  04/25/2014   CLINICAL DATA:  68 year old male with subdural hematoma. Respiratory failure and difficulty.  EXAM: PORTABLE CHEST - 1 VIEW  COMPARISON:  04/23/2014 and prior chest radiographs  FINDINGS: Cardiomegaly and prior cardiac surgical changes noted.  An endotracheal tube is is identified with tip 3.5 cm above the carina.  A small bore feeding tube is again identified entering the stomach with tip off the field of view.  Significantly improved left lung aeration is noted compatible with significant improved atelectasis. Persistent left lower lung opacity is noted and may represent residual atelectasis or airspace disease/pneumonia.  Mild pulmonary vascular congestion is noted.  Mild right basilar atelectasis is identified.  There is no evident thorax.  IMPRESSION: Significantly improved aeration within the left lung compatible with improved left lung atelectasis. Persistent left lower lung opacity may represent atelectasis or airspace disease pneumonia. .   Electronically Signed   By: Hassan Rowan M.D.   On: 04/25/2014 09:44   ASSESSMENT / PLAN:  PULMONARY A:  VDRF - in setting [place] with worsening mental status and repeat OR for evolving hemorrhage Atx/collapse L hemithorax -  resolved post bscopy 5/23 Smoker -likely COPD P:   Cont vent support  Daily SBT - mental status limiting extubation  Albut/atrovent nebs Cont Mucomyst & chest PT - mucomyst to finish 5/25   NEUROLOGIC A:   SDH s/p evacuation ICH Hx ETOH  Delirium, alcoholic - improving  P:   Per Neurosurgery Thiamine/Folate Fentanyl gtt - wean as able  PRN versed  Use propofol overnight & turn off in am  to allow improved mental status for extubation - ?was on precedex previously  CARDIOVASCULAR A:  HTN AF, controlled rate P:  Labetalol PRN Cont home coreg Statin  PO cardizem  Hold ace while critically ill  No anticoagulation in setting bleed   RENAL A:   Hypokalemia  Hypophosphatemia  P:   KVO  IVF  Trend BMP Hold ace  Replete K prn  replete phos 5/25 F/u mg, phos    GASTROINTESTINAL A:  Nutrition Constipation P:   TF colace  HEMATOLOGIC A:   Coumadin induced coagulopathy, reversed VTE Px P:  Likely not a candidate for any further anticoagulation SCD  INFECTIOUS A:   Fevers  P:   Trend fever, wbc curve  F/u BAL  Cont ancef as above - ?d/c  ENDOCRINE A:   Hyperglycemia - no known hx DM P:   SSI   Nickolas Madrid, NP 04/26/2014  8:51 AM Pager: [phone] or [phone]  *Care during the described time interval was provided by me and/or other providers on the critical care team. I have reviewed this patient's available data, including medical history, events of note, physical examination and test results as part of my evaluation.   The patient  is critically ill with multiple organ systems failure and requires high complexity decision making for assessment and support, frequent evaluation and titration of therapies, application of advanced monitoring technologies and extensive interpretation of multiple databases. Critical Care Time devoted to patient care services described in this note is 35 minutes.  Kara Mead MD. Shade Flood. [facility] Pager [phone] If no response call [phone]

## 2014-04-26 NOTE — Progress Notes (Signed)
Propofol drip initiated and Fentanyl drip d/c'd. Wasted 110 cc Fentanyl, concentration 26mcg/mL. Gerline Legacy, RN as witness.   Latrelle Dodrill

## 2014-04-27 ENCOUNTER — Encounter (HOSPITAL_COMMUNITY): Payer: Self-pay | Admitting: Neurosurgery

## 2014-04-27 ENCOUNTER — Inpatient Hospital Stay (HOSPITAL_COMMUNITY): Payer: BC Managed Care – PPO

## 2014-04-27 DIAGNOSIS — I5023 Acute on chronic systolic (congestive) heart failure: Secondary | ICD-10-CM

## 2014-04-27 LAB — CBC
HCT: 28.4 % — ABNORMAL LOW (ref 39.0–52.0)
HEMOGLOBIN: 9.4 g/dL — AB (ref 13.0–17.0)
MCH: 30.5 pg (ref 26.0–34.0)
MCHC: 33.1 g/dL (ref 30.0–36.0)
MCV: 92.2 fL (ref 78.0–100.0)
Platelets: 199 10*3/uL (ref 150–400)
RBC: 3.08 MIL/uL — ABNORMAL LOW (ref 4.22–5.81)
RDW: 14.2 % (ref 11.5–15.5)
WBC: 9.8 10*3/uL (ref 4.0–10.5)

## 2014-04-27 LAB — BASIC METABOLIC PANEL
BUN: 20 mg/dL (ref 6–23)
CHLORIDE: 110 meq/L (ref 96–112)
CO2: 26 mEq/L (ref 19–32)
Calcium: 8.6 mg/dL (ref 8.4–10.5)
Creatinine, Ser: 1.02 mg/dL (ref 0.50–1.35)
GFR calc non Af Amer: 73 mL/min — ABNORMAL LOW (ref >90)
GFR, EST AFRICAN AMERICAN: 85 mL/min — AB (ref >90)
Glucose, Bld: 132 mg/dL — ABNORMAL HIGH (ref 70–99)
POTASSIUM: 3.9 meq/L (ref 3.7–5.3)
Sodium: 146 mEq/L (ref 137–147)

## 2014-04-27 LAB — GLUCOSE, CAPILLARY
Glucose-Capillary: 122 mg/dL — ABNORMAL HIGH (ref 70–99)
Glucose-Capillary: 126 mg/dL — ABNORMAL HIGH (ref 70–99)
Glucose-Capillary: 127 mg/dL — ABNORMAL HIGH (ref 70–99)
Glucose-Capillary: 132 mg/dL — ABNORMAL HIGH (ref 70–99)
Glucose-Capillary: 136 mg/dL — ABNORMAL HIGH (ref 70–99)
Glucose-Capillary: 139 mg/dL — ABNORMAL HIGH (ref 70–99)

## 2014-04-27 LAB — URINALYSIS, ROUTINE W REFLEX MICROSCOPIC
Bilirubin Urine: NEGATIVE
Glucose, UA: NEGATIVE mg/dL
KETONES UR: NEGATIVE mg/dL
NITRITE: NEGATIVE
PH: 6 (ref 5.0–8.0)
Protein, ur: 30 mg/dL — AB
Specific Gravity, Urine: 1.026 (ref 1.005–1.030)
UROBILINOGEN UA: 4 mg/dL — AB (ref 0.0–1.0)

## 2014-04-27 LAB — URINE MICROSCOPIC-ADD ON

## 2014-04-27 LAB — PHOSPHORUS: PHOSPHORUS: 2.7 mg/dL (ref 2.3–4.6)

## 2014-04-27 MED ORDER — PRO-STAT SUGAR FREE PO LIQD
60.0000 mL | Freq: Three times a day (TID) | ORAL | Status: DC
  Administered 2014-04-27 – 2014-04-29 (×4): 60 mL
  Filled 2014-04-27 (×8): qty 60

## 2014-04-27 MED ORDER — LACTULOSE 10 GM/15ML PO SOLN
30.0000 g | Freq: Every day | ORAL | Status: DC
  Administered 2014-04-27: 30 g
  Filled 2014-04-27 (×3): qty 45

## 2014-04-27 MED ORDER — PIPERACILLIN-TAZOBACTAM 3.375 G IVPB
3.3750 g | Freq: Three times a day (TID) | INTRAVENOUS | Status: DC
  Filled 2014-04-27 (×2): qty 50

## 2014-04-27 MED ORDER — DEXTROSE 5 % IV SOLN
1.0000 g | Freq: Three times a day (TID) | INTRAVENOUS | Status: DC
  Administered 2014-04-27 – 2014-04-30 (×10): 1 g via INTRAVENOUS
  Filled 2014-04-27 (×11): qty 1

## 2014-04-27 MED ORDER — FENTANYL CITRATE 0.05 MG/ML IJ SOLN
50.0000 ug | INTRAMUSCULAR | Status: DC | PRN
  Administered 2014-04-27 – 2014-04-30 (×9): 50 ug via INTRAVENOUS
  Filled 2014-04-27 (×8): qty 2

## 2014-04-27 MED ORDER — VITAL AF 1.2 CAL PO LIQD
1000.0000 mL | ORAL | Status: DC
  Filled 2014-04-27 (×4): qty 1000

## 2014-04-27 MED ORDER — ACETAMINOPHEN 160 MG/5ML PO SOLN
650.0000 mg | ORAL | Status: DC | PRN
  Administered 2014-04-27: 650 mg
  Filled 2014-04-27: qty 20.3

## 2014-04-27 MED ORDER — VANCOMYCIN HCL IN DEXTROSE 1-5 GM/200ML-% IV SOLN
1000.0000 mg | Freq: Two times a day (BID) | INTRAVENOUS | Status: DC
  Administered 2014-04-27 – 2014-04-29 (×5): 1000 mg via INTRAVENOUS
  Filled 2014-04-27 (×7): qty 200

## 2014-04-27 MED ORDER — VANCOMYCIN HCL 10 G IV SOLR
1750.0000 mg | Freq: Once | INTRAVENOUS | Status: AC
  Administered 2014-04-27: 1750 mg via INTRAVENOUS
  Filled 2014-04-27: qty 1750

## 2014-04-27 NOTE — Progress Notes (Signed)
Physical medicine rehabilitation consult requested. Patient currently on ventilator. Please reconsult when medically stable in therapies initiated

## 2014-04-27 NOTE — Progress Notes (Signed)
CPT was held tonight. Pt constantly thrashing and moving legs out of bed while therapy is in process.

## 2014-04-27 NOTE — Progress Notes (Signed)
Subjective: Patient reports Remains intubated and sedated on propofol  Objective: Vital signs in last 24 hours: Temp:  [98.6 F (37 C)-100.4 F (38 C)] 98.6 F (37 C) (05/26 0351) Pulse Rate:  [68-138] 101 (05/26 0700) Resp:  [15-26] 24 (05/26 0700) BP: (91-144)/(48-73) 133/64 mmHg (05/26 0700) SpO2:  [95 %-100 %] 99 % (05/26 0700) Arterial Line BP: (119-181)/(43-65) 129/47 mmHg (05/26 0700) FiO2 (%):  [40 %] 40 % (05/26 0437) Weight:  [106.2 kg (234 lb 2.1 oz)] 106.2 kg (234 lb 2.1 oz) (05/26 0400)  Intake/Output from previous day: 05/25 0701 - 05/26 0700 In: 3056.7 [I.V.:427.7; NG/GT:2025; IV Piggyback:604] Out: 1210 [Urine:1210] Intake/Output this shift:    Will awaken with stimulation and follow commands symmetrically  Lab Results:  Recent Labs  04/26/14 0500 04/27/14 0545  WBC 10.1 9.8  HGB 10.0* 9.4*  HCT 29.8* 28.4*  PLT 186 199   BMET  Recent Labs  04/26/14 0500 04/27/14 0545  NA 145 146  K 3.8 3.9  CL 112 110  CO2 24 26  GLUCOSE 180* 132*  BUN 18 20  CREATININE 0.92 1.02  CALCIUM 8.5 8.6    Studies/Results: Dg Chest Portable 1 View  04/26/2014   CLINICAL DATA:  Atelectasis  EXAM: PORTABLE CHEST - 1 VIEW  COMPARISON:  Prior chest x-ray 04/25/2014  FINDINGS: The endotracheal tube is 4.2 cm above the carina. A feeding tube is present, the tip is not identified however the tube extends off the scarring below the diaphragm. Stable cardiomegaly. Slightly improved aeration of the left lung base may reflect resolving atelectasis. There is persistent pulmonary vascular congestion bordering on interstitial edema as well as foci of patchy opacity in the right upper and mid lung. 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 Atherosclerotic calcifications noted in the transverse aorta.  IMPRESSION: 1. Slightly improved aeration in the left lung base may reflect improving atelectasis. 2. Persistent patchy opacities in the right  upper lung and right mid lung may reflect asymmetric edema, areas of subsegmental atelectasis or foci of infection/inflammation. 3. Stable cardiomegaly and pulmonary vascular congestion bordering on mild interstitial edema. 4. Stable and satisfactory support apparatus.   Electronically Signed   By: Jacqulynn Cadet M.D.   On: 04/26/2014 08:52   Dg Chest Portable 1 View  04/25/2014   CLINICAL DATA:  68 year old male with subdural hematoma. Respiratory failure and difficulty.  EXAM: PORTABLE CHEST - 1 VIEW  COMPARISON:  04/23/2014 and prior chest radiographs  FINDINGS: Cardiomegaly and prior cardiac surgical changes noted.  An endotracheal tube is is identified with tip 3.5 cm above the carina.  A small bore feeding tube is again identified entering the stomach with tip off the field of view.  Significantly improved left lung aeration is noted compatible with significant improved atelectasis. Persistent left lower lung opacity is noted and may represent residual atelectasis or airspace disease/pneumonia.  Mild pulmonary vascular congestion is noted.  Mild right basilar atelectasis is identified.  There is no evident thorax.  IMPRESSION: Significantly improved aeration within the left lung compatible with improved left lung atelectasis. Persistent left lower lung opacity may represent atelectasis or airspace disease pneumonia. .   Electronically Signed   By: Hassan Rowan M.D.   On: 04/25/2014 09:44    Assessment/Plan: Continue pulmonary management per CCM order repeat a CT scan in the morning  LOS: 8 days     [name] 04/27/2014, 8:05 AM

## 2014-04-27 NOTE — Progress Notes (Signed)
PULMONARY / CRITICAL CARE MEDICINE   Name: Alejandro [name] MRN: [id number] DOB: 11-25-1946    ADMISSION DATE:  04/19/2014 CONSULTATION DATE:  04/19/2014  REFERRING MD :  EDP PRIMARY SERVICE: PCCM  CHIEF COMPLAINT:  VDRF post-op crani for SDH  BRIEF PATIENT DESCRIPTION: 68 yo with AF on Coumadin admitted after fall.  Head CT revealed SDH with midline shift, and hemorrhagic contusion of the right temporal lobe. Underwent craniotomy and hematoma evacuation.  SIGNIFICANT EVENTS / STUDIES:  5/18  Head CT >>> SDH with midline shift, and hemorrhagic contusion of the right temporal lobe 5/18  OR >>> Craniotomy, hematoma evacuation 5/22 CT head>>> evolution of intraparenchymal hemorrhagic contusion or possibly venous infarction in the right temporal lobe with significant right temporal lobe swelling 5/22  OR>> Reexploration of right pterional craniotomy for partial temporal lobectomy and evacuation of right temporal hematoma with implantation of the bone flap in the right lateral abdominal wall cavity, remained intubated post op  5/23 FOB>>>minimal secretions 5/26- improved atx   LINES / TUBES: ETT 5/22>>> Left radial 5/22>>>  CULTURES: BAL 5/23>>>NF  ANTIBIOTICS: Cefazolin 5/22>>>  INTERVAL HISTORY:   aggitation  VITAL SIGNS: Temp:  [98.6 F (37 C)-101.2 F (38.4 C)] 101.2 F (38.4 C) (05/26 0800) Pulse Rate:  [77-138] 96 (05/26 0900) Resp:  [15-26] 24 (05/26 0900) BP: (91-140)/(48-73) 117/54 mmHg (05/26 0817) SpO2:  [95 %-100 %] 100 % (05/26 0900) Arterial Line BP: (119-181)/(43-65) 158/57 mmHg (05/26 0900) FiO2 (%):  [40 %] 40 % (05/26 0900) Weight:  [106.2 kg (234 lb 2.1 oz)] 106.2 kg (234 lb 2.1 oz) (05/26 0400)  HEMODYNAMICS:   VENTILATOR SETTINGS: Vent Mode:  [-] CPAP;PSV FiO2 (%):  [40 %] 40 % Set Rate:  [14 bmp] 14 bmp Vt Set:  [650 mL] 650 mL PEEP:  [5 cmH20] 5 cmH20 Pressure Support:  [10 cmH20] 10 cmH20 Plateau Pressure:  [15 cmH20-28 cmH20] 28 cmH20 INTAKE /  OUTPUT: Intake/Output     05/25 0701 - 05/26 0700 05/26 0701 - 05/27 0700   I.V. (mL/kg) 427.7 (4) 39.2 (0.4)   NG/GT 2025 260   IV Piggyback 604    Total Intake(mL/kg) 3056.7 (28.8) 299.2 (2.8)   Urine (mL/kg/hr) 1210 (0.5) 130 (0.5)   Total Output 1210 130   Net +1846.7 +169.2         PHYSICAL EXAMINATION: General: sedated on vent, NAD Neuro: sedated, RASS 2 to -2, moves all ext equal, follows commands, perr  HEENT: Surgical dressing intact Cardiovascular:  s1 s2 Irregular, no murmurs Lungs:  Ronchi, improve dleft Abdomen:  Soft, bowel sounds present Musculoskeletal:  Trace edema Skin:  No rash  LABS:  CBC  Recent Labs Lab 04/25/14 0430 04/26/14 0500 04/27/14 0545  WBC 9.1 10.1 9.8  HGB 9.8* 10.0* 9.4*  HCT 29.1* 29.8* 28.4*  PLT 172 186 199   Coag's  Recent Labs Lab 04/21/14 0319 04/22/14 0300  INR 1.02 1.21   BMET  Recent Labs Lab 04/25/14 0430 04/26/14 0500 04/27/14 0545  NA 147 145 146  K 3.7 3.8 3.9  CL 113* 112 110  CO2 25 24 26   BUN 20 18 20   CREATININE 1.03 0.92 1.02  GLUCOSE 142* 180* 132*   Electrolytes  Recent Labs Lab 04/24/14 0400 04/25/14 0430 04/26/14 0500 04/27/14 0545  CALCIUM 8.3* 8.4 8.5 8.6  MG 2.4 2.5 2.4  --   PHOS 2.5 2.0* 1.9*  --    Sepsis Markers No results found for this basename: LATICACIDVEN, PROCALCITON, O2SATVEN,  in the last 168 hours ABG  Recent Labs Lab 04/24/14 0446  PHART 7.402  PCO2ART 31.5*  PO2ART 91.0   Liver Enzymes No results found for this basename: AST, ALT, ALKPHOS, BILITOT, ALBUMIN,  in the last 168 hours Cardiac Enzymes No results found for this basename: TROPONINI, PROBNP,  in the last 168 hours Glucose  Recent Labs Lab 04/26/14 1213 04/26/14 1555 04/26/14 2003 04/27/14 0005 04/27/14 0349 04/27/14 0754  GLUCAP 151* 139* 129* 132* 127* 136*    IMAGING:  Dg Chest Portable 1 View  04/27/2014   CLINICAL DATA:  Atelectasis/collapse.  EXAM: PORTABLE CHEST - 1 VIEW   COMPARISON:  04/26/2014 and 04/25/2014.  FINDINGS: 0552 hr. The endotracheal tube and feeding tube appear unchanged, the tip of the latter not visualized. There is stable cardiomegaly, pulmonary edema and left greater than right basilar airspace opacities. A small left pleural effusion is suspected. Patchy perihilar opacities on the right are stable. There is no pneumothorax.  IMPRESSION: Stable edema and bibasilar airspace opacities.   Electronically Signed   By: Camie Patience M.D.   On: 04/27/2014 08:07   Dg Chest Portable 1 View  04/26/2014   CLINICAL DATA:  Atelectasis  EXAM: PORTABLE CHEST - 1 VIEW  COMPARISON:  Prior chest x-ray 04/25/2014  FINDINGS: The endotracheal tube is 4.2 cm above the carina. A feeding tube is present, the tip is not identified however the tube extends off the scarring below the diaphragm. Stable cardiomegaly. Slightly improved aeration of the left lung base may reflect resolving atelectasis. There is persistent pulmonary vascular congestion bordering on interstitial edema as well as foci of patchy opacity in the right upper and mid lung. 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 Atherosclerotic calcifications noted in the transverse aorta.  IMPRESSION: 1. Slightly improved aeration in the left lung base may reflect improving atelectasis. 2. Persistent patchy opacities in the right upper lung and right mid lung may reflect asymmetric edema, areas of subsegmental atelectasis or foci of infection/inflammation. 3. Stable cardiomegaly and pulmonary vascular congestion bordering on mild interstitial edema. 4. Stable and satisfactory support apparatus.   Electronically Signed   By: Jacqulynn Cadet M.D.   On: 04/26/2014 08:52   ASSESSMENT / PLAN:  PULMONARY A:  VDRF - in setting ICH with worsening mental status and repeat OR for evolving hemorrhage Atx/collapse L hemithorax -  resolved post bscopy 5/23- resolved Smoker -likely COPD P:    Cont vent support  Wean cpap5 ps 5, goal , currently at PS 10, need redcution PS if able Albut/atrovent nebs Upright pcxr again in am for atx left  NEUROLOGIC A:   SDH s/p evacuation ICH Hx ETOH  Delirium, alcoholic - improving  P:   Per Neurosurgery Thiamine/Folate Fentanyl gtt - wean as able  PRN versed  Use propofol, WUA  CARDIOVASCULAR A:  HTN AF, controlled rate P:  Labetalol PRN Cont home coreg Statin  PO cardizem  No anticoagulation in setting bleed  tele  RENAL A:   Hypokalemia  Hypophosphatemia  P:   KVO IVF  Trend BMP am, including phos again Phos add on , in settig weaning  GASTROINTESTINAL A:  Nutrition Constipation P:   TF Colace May need catharctic  HEMATOLOGIC A:   Coumadin induced coagulopathy, reversed VTE Px P:  Likely not a candidate for any further anticoagulation SCD  INFECTIOUS A:   ATX left, fever reoccurrence (central?) P:   UA Send BC May need to change to  ceftaz, vanc Dc ancef  ENDOCRINE A:   Hyperglycemia - no known hx DM P:   SSI   The patient is critically ill with multiple organ systems failure and requires high complexity decision making for assessment and support, frequent evaluation and titration of therapies, application of advanced monitoring technologies and extensive interpretation of multiple databases. Critical Care Time devoted to patient care services described in this note is 35 minutes.  Lavon Paganini. Titus Mould, MD, [place] Pgr: [facility]

## 2014-04-27 NOTE — Progress Notes (Addendum)
NUTRITION FOLLOW-UP  DOCUMENTATION CODES Per approved criteria  -Obesity Unspecified   INTERVENTION: Vital AF 1.2 @ 30 ml/hr via NG tube   60 ml Prostat TID  Tube feeding regimen provides 1464 kcal, 146 grams of protein, and 583 ml of H2O.   TF regimen and current Propofol rate provides: 1717 kcal (24 kcal/kg IBW).   NUTRITION DIAGNOSIS: Inadequate oral intake related to inability to eat as evidenced by NPO status; ongoing  Goal: Pt to meet >/= 90% of their estimated nutrition needs, met.   Enteral nutrition to provide 60-70% of estimated calorie needs (22-25 kcals/kg ideal body weight) and 100% of estimated protein needs, based on ASPEN guidelines for permissive underfeeding in critically ill obese individuals  Monitor:  Respiratory status, TF initiation and tolerance, weight trend, labs  ASSESSMENT: 68 yo with AF on Coumadin admitted after fall. Head CT revealed SDH with midline shift, and hemorrhagic contusion of the right temporal lobe. Underwent craniotomy and hematoma evacuation. Per MD notes, pt with hx of ETOH abuse.   Pt had reexploration of right pterional craniotomy for partial temporal lobectomy and evacuation of right temporal hematoma with implantation of the bone flap in the right lateral abdominal wall cavity 5/22 and has remained on the vent.  Patient is currently intubated on ventilator support MV: 11.5 L/min Temp (24hrs), Avg:100.1 F (37.8 C), Min:98.6 F (37 C), Max:101.2 F (38.4 C)  Propofol: 9.6 ml/hr provides: 253 kcal per day  Pt discussed during ICU rounds and with RN.  Pt with NG tube in place, tip in body of stomach.  Pt may be extubated tomorrow.  Phosphorus: 2.2 -> 2.5 -> 2.0 -> 1.9 -> 2.7 Phosphorus being replaced.   Height: Ht Readings from Last 1 Encounters:  04/19/14 5' 8"  (1.727 m)    Weight: Wt Readings from Last 1 Encounters:  04/27/14 234 lb 2.1 oz (106.2 kg)  Admission weight 237 lb (107.6 kg) 5/22   BMI:  Body mass index  is 35.61 kg/(m^2).  Estimated Nutritional Needs: Kcal: 2159  Protein: >/= 140 grams  Fluid: > 2 L/day  Skin: right head incision, abdominal incision  Diet Order: NPO   Intake/Output Summary (Last 24 hours) at 04/27/14 1406 Last data filed at 04/27/14 1300  Gross per 24 hour  Intake 3074.3 ml  Output   1185 ml  Net 1889.3 ml    Last BM: 5/18 - lactulose ordered today  Labs:   Recent Labs Lab 04/24/14 0400 04/25/14 0430 04/26/14 0500 04/27/14 0500 04/27/14 0545  NA 146 147 145  --  146  K 3.6* 3.7 3.8  --  3.9  CL 111 113* 112  --  110  CO2 21 25 24   --  26  BUN 23 20 18   --  20  CREATININE 1.10 1.03 0.92  --  1.02  CALCIUM 8.3* 8.4 8.5  --  8.6  MG 2.4 2.5 2.4  --   --   PHOS 2.5 2.0* 1.9* 2.7  --   GLUCOSE 101* 142* 180*  --  132*    CBG (last 3)   Recent Labs  04/27/14 0349 04/27/14 0754 04/27/14 1141  GLUCAP 127* 136* 139*    Scheduled Meds: . antiseptic oral rinse  15 mL Mouth Rinse q12n4p  . atorvastatin  20 mg Per Tube q1800  . carvedilol  25 mg Per Tube BID WC  . cefTAZidime (FORTAZ)  IV  1 g Intravenous Q8H  . chlorhexidine  15 mL Mouth Rinse BID  .  diltiazem  30 mg Oral 3 times per day  . docusate  100 mg Oral Daily  . folic acid  1 mg Oral Daily  . insulin aspart  0-15 Units Subcutaneous 6 times per day  . ipratropium-albuterol  3 mL Nebulization QID  . lactulose  30 g Per Tube Daily  . levETIRAcetam  500 mg Oral BID  . multivitamin  5 mL Per Tube Daily  . pantoprazole sodium  40 mg Per Tube Q1200  . thiamine  100 mg Oral Daily  . vancomycin  1,750 mg Intravenous Once  . vancomycin  1,000 mg Intravenous Q12H    Continuous Infusions: . sodium chloride 10 mL/hr at 04/25/14 0600  . feeding supplement (VITAL AF 1.2 CAL) 1,000 mL (04/27/14 0652)  . fentaNYL infusion INTRAVENOUS Stopped (04/26/14 1520)  . propofol 15 mcg/kg/min (04/27/14 1330)    Past Medical History  Diagnosis Date  . Systolic heart failure   . CAD (coronary  artery disease)     s/p CABGx4 on 12/24/2008  . Dyslipidemia   . HTN (hypertension)   . Atrial fibrillation   . Cancer     around nose  . Tobacco abuse   . Alcohol abuse   . H/O hiatal hernia     Past Surgical History  Procedure Laterality Date  . Coronary artery bypass graft  12/24/2008    4 vessel  . Umbilical hernia repair  2010  . Hernia repair  2012  . Coronary artery bypass graft  2012  . Ventral hernia repair N/A 01/22/2013    Procedure: HERNIA REPAIR VENTRAL ADULT;  Surgeon: [name], MD;  Location: [facility];  Service: General;  Laterality: N/A;  . Application of a-cell of chest/abdomen N/A 01/22/2013    Procedure: APPLICATION OF A-CELL OF CHEST/ABDOMEN;  Surgeon: [name], MD;  Location: [facility];  Service: General;  Laterality: N/A;  . Cystoscopy N/A 01/22/2013    Procedure: Consuela Mimes;  Surgeon: [name], MD;  Location: [facility];  Service: Urology;  Laterality: N/A;  Cystoscopy with balloon dilation. Insertion of coude catheter.  . Craniotomy Right 04/19/2014    Procedure: CRANIOTOMY HEMATOMA EVACUATION SUBDURAL;  Surgeon: [name], MD;  Location: [facility];  Service: Neurosurgery;  Laterality: Right;  right    [place], [place], [place] Pager [phone] After Hours Pager

## 2014-04-27 NOTE — Progress Notes (Signed)
ANTIBIOTIC CONSULT NOTE - INITIAL  Pharmacy Consult for Vancomycin and Fortaz Indication: rule out sepsis  No Known Allergies Patient Measurements: Height: 5\' 8"  (172.7 cm) Weight: 234 lb 2.1 oz (106.2 kg) IBW/kg (Calculated) : 68.4 Vital Signs: Temp: 101.2 F (38.4 C) (05/26 0800) Temp src: Oral (05/26 0800) BP: 117/54 mmHg (05/26 0817) Pulse Rate: 96 (05/26 0900) Intake/Output from previous day: 05/25 0701 - 05/26 0700 In: 3056.7 [I.V.:427.7; NG/GT:2025; IV RWERXVQMG:867] Out: 1210 [Urine:1210] Intake/Output from this shift: Total I/O In: 299.2 [I.V.:39.2; NG/GT:260] Out: 130 [Urine:130] Labs:  Recent Labs  04/25/14 0430 04/26/14 0500 04/27/14 0545  WBC 9.1 10.1 9.8  HGB 9.8* 10.0* 9.4*  PLT 172 186 199  CREATININE 1.03 0.92 1.02   Estimated Creatinine Clearance: 81.9 ml/min (by C-G formula based on Cr of 1.02).  Microbiology: Recent Results (from the past 720 hour(s))  MRSA PCR SCREENING     Status: None   Collection Time    04/19/14  3:14 PM      Result Value Ref Range Status   MRSA by PCR NEGATIVE  NEGATIVE Final   Comment:            The GeneXpert MRSA Assay (FDA     approved for NASAL specimens     only), is one component of a     comprehensive MRSA colonization     surveillance program. It is not     intended to diagnose MRSA     infection nor to guide or     monitor treatment for     MRSA infections.  CULTURE, BAL-QUANTITATIVE     Status: None   Collection Time    04/24/14  1:45 PM      Result Value Ref Range Status   Specimen Description BRONCHIAL ALVEOLAR LAVAGE   Final   Special Requests NONE   Final   Gram Stain     Final   Value: RARE WBC PRESENT,BOTH PMN AND MONONUCLEAR     NO SQUAMOUS EPITHELIAL CELLS SEEN     NO ORGANISMS SEEN     Performed at [facility] Count     Final   Value: 2,000 COLONIES/ML     Performed at [facility]   Culture     Final   Value: Non-Pathogenic Oropharyngeal-type Flora Isolated.      Performed at [facility]   Report Status 04/26/2014 FINAL   Final    Medical History: Past Medical History  Diagnosis Date  . Systolic heart failure   . 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 Dyslipidemia   . HTN (hypertension)   . Atrial fibrillation   . Cancer     around nose  . Tobacco abuse   . Alcohol abuse   . H/O hiatal hernia     Medications:  Anti-infectives   Start     Dose/Rate Route Frequency Ordered Stop   04/23/14 2200  ceFAZolin (ANCEF) IVPB 2 g/50 mL premix  Status:  Discontinued     2 g 100 mL/hr over 30 Minutes Intravenous 3 times per day 04/23/14 1750 04/27/14 0951   04/23/14 1638  bacitracin 50,000 Units in sodium chloride irrigation 0.9 % 500 mL irrigation  Status:  Discontinued       As needed 04/23/14 1639 04/23/14 1733   04/20/14 0200  ceFAZolin (ANCEF) IVPB 1 g/50 mL premix     1 g 100 mL/hr over 30 Minutes Intravenous  Every 8 hours 04/19/14 2104 04/20/14 1011   04/19/14 1831  bacitracin 50,000 Units in sodium chloride irrigation 0.9 % 500 mL irrigation  Status:  Discontinued       As needed 04/19/14 1835 04/19/14 1917     Assessment: 40 YOM s/p craniotomy and hematoma evacuation from SDH to start vancomycin and Fortaz for rule out sepsis. Patient has been on Ancef for surgical prophylaxis for 5 days. WBC is wnl. New fever of 101.2. SCr is stable with estimated CrCl~80-68mL/min.  5/26 Blood x2 >> 5/23 BAL- NPF 5/18 MRSA pcr negative.    Goal of Therapy:  Vancomycin trough level 15-20 mcg/ml  Plan:  Fortaz 1g IV q8h. Vancomycin 1750mg  IV x1 now, then 1000mg  IV q12h.  Vancomycin trough as appropriate once at steady state.  Follow-up renal function, cultures, and clinical status.   Sloan Leiter, PharmD, BCPS Clinical Pharmacist [phone] 04/27/2014,10:01 AM

## 2014-04-28 ENCOUNTER — Inpatient Hospital Stay (HOSPITAL_COMMUNITY): Payer: BC Managed Care – PPO

## 2014-04-28 DIAGNOSIS — I5023 Acute on chronic systolic (congestive) heart failure: Secondary | ICD-10-CM

## 2014-04-28 DIAGNOSIS — R404 Transient alteration of awareness: Secondary | ICD-10-CM

## 2014-04-28 DIAGNOSIS — J9600 Acute respiratory failure, unspecified whether with hypoxia or hypercapnia: Secondary | ICD-10-CM

## 2014-04-28 DIAGNOSIS — I6200 Nontraumatic subdural hemorrhage, unspecified: Secondary | ICD-10-CM

## 2014-04-28 LAB — PROTEIN AND GLUCOSE, CSF: Glucose, CSF: 71 mg/dL (ref 43–76)

## 2014-04-28 LAB — COMPREHENSIVE METABOLIC PANEL WITH GFR
ALT: 30 U/L (ref 0–53)
AST: 45 U/L — ABNORMAL HIGH (ref 0–37)
Albumin: 2.2 g/dL — ABNORMAL LOW (ref 3.5–5.2)
Alkaline Phosphatase: 72 U/L (ref 39–117)
BUN: 27 mg/dL — ABNORMAL HIGH (ref 6–23)
CO2: 26 meq/L (ref 19–32)
Calcium: 8.4 mg/dL (ref 8.4–10.5)
Chloride: 110 meq/L (ref 96–112)
Creatinine, Ser: 1.13 mg/dL (ref 0.50–1.35)
GFR calc Af Amer: 75 mL/min — ABNORMAL LOW
GFR calc non Af Amer: 65 mL/min — ABNORMAL LOW
Glucose, Bld: 115 mg/dL — ABNORMAL HIGH (ref 70–99)
Potassium: 4 meq/L (ref 3.7–5.3)
Sodium: 147 meq/L (ref 137–147)
Total Bilirubin: 0.5 mg/dL (ref 0.3–1.2)
Total Protein: 5.8 g/dL — ABNORMAL LOW (ref 6.0–8.3)

## 2014-04-28 LAB — CSF CELL COUNT WITH DIFFERENTIAL
Eosinophils, CSF: 0 % (ref 0–1)
Lymphs, CSF: 8 % — ABNORMAL LOW (ref 40–80)
Monocyte-Macrophage-Spinal Fluid: 2 % — ABNORMAL LOW (ref 15–45)
RBC Count, CSF: 24850 /mm3 — ABNORMAL HIGH
SEGMENTED NEUTROPHILS-CSF: 90 % — AB (ref 0–6)
WBC, CSF: 128 /mm3 (ref 0–5)

## 2014-04-28 LAB — GLUCOSE, CAPILLARY
GLUCOSE-CAPILLARY: 104 mg/dL — AB (ref 70–99)
GLUCOSE-CAPILLARY: 132 mg/dL — AB (ref 70–99)
Glucose-Capillary: 114 mg/dL — ABNORMAL HIGH (ref 70–99)
Glucose-Capillary: 121 mg/dL — ABNORMAL HIGH (ref 70–99)
Glucose-Capillary: 147 mg/dL — ABNORMAL HIGH (ref 70–99)
Glucose-Capillary: 144 mg/dL — ABNORMAL HIGH (ref 70–99)

## 2014-04-28 LAB — CBC WITH DIFFERENTIAL/PLATELET
Basophils Absolute: 0 K/uL (ref 0.0–0.1)
Basophils Relative: 0 % (ref 0–1)
Eosinophils Absolute: 0.2 K/uL (ref 0.0–0.7)
Eosinophils Relative: 3 % (ref 0–5)
HCT: 25.5 % — ABNORMAL LOW (ref 39.0–52.0)
Hemoglobin: 8.5 g/dL — ABNORMAL LOW (ref 13.0–17.0)
Lymphocytes Relative: 14 % (ref 12–46)
Lymphs Abs: 1.3 K/uL (ref 0.7–4.0)
MCH: 30.9 pg (ref 26.0–34.0)
MCHC: 33.3 g/dL (ref 30.0–36.0)
MCV: 92.7 fL (ref 78.0–100.0)
Monocytes Absolute: 0.5 K/uL (ref 0.1–1.0)
Monocytes Relative: 6 % (ref 3–12)
Neutro Abs: 7 K/uL (ref 1.7–7.7)
Neutrophils Relative %: 77 % (ref 43–77)
Platelets: 204 K/uL (ref 150–400)
RBC: 2.75 MIL/uL — ABNORMAL LOW (ref 4.22–5.81)
RDW: 14.4 % (ref 11.5–15.5)
WBC: 9.1 K/uL (ref 4.0–10.5)

## 2014-04-28 LAB — GRAM STAIN

## 2014-04-28 MED ORDER — RESOURCE THICKENUP CLEAR PO POWD
ORAL | Status: DC | PRN
  Filled 2014-04-28 (×2): qty 125

## 2014-04-28 MED ORDER — FUROSEMIDE 10 MG/ML IJ SOLN
20.0000 mg | Freq: Two times a day (BID) | INTRAMUSCULAR | Status: DC
  Administered 2014-04-28 (×2): 20 mg via INTRAVENOUS
  Filled 2014-04-28 (×3): qty 2

## 2014-04-28 MED ORDER — DILTIAZEM HCL 30 MG PO TABS
30.0000 mg | ORAL_TABLET | Freq: Three times a day (TID) | ORAL | Status: DC
  Administered 2014-04-28 – 2014-04-29 (×3): 30 mg via ORAL
  Filled 2014-04-28 (×6): qty 1

## 2014-04-28 MED ORDER — DEXAMETHASONE SODIUM PHOSPHATE 4 MG/ML IJ SOLN
4.0000 mg | Freq: Four times a day (QID) | INTRAMUSCULAR | Status: DC
  Administered 2014-04-28 – 2014-05-03 (×20): 4 mg via INTRAVENOUS
  Filled 2014-04-28 (×26): qty 1

## 2014-04-28 NOTE — Progress Notes (Signed)
CRITICAL VALUE ALERT  Critical value received:  WBC 128 in CSF  Date of notification:  04/28/2014    Time of notification:  1239  Critical value read back:yes  Nurse who received alert:  Louann Sjogren, RN  MD notified (1st page):  Saintclair Halsted  Time of first page:  1240 - phone  MD notified (2nd page):  Time of second page:  Responding MD:  Saintclair Halsted  Time MD responded:  phone

## 2014-04-28 NOTE — Progress Notes (Signed)
PULMONARY / CRITICAL CARE MEDICINE   Name: Alejandro [name] MRN: [id number] DOB: 07-09-1946    ADMISSION DATE:  04/19/2014 CONSULTATION DATE:  04/19/2014  REFERRING MD :  EDP PRIMARY SERVICE: PCCM  CHIEF COMPLAINT:  VDRF post-op crani for SDH  BRIEF PATIENT DESCRIPTION: 68 yo with AF on Coumadin admitted after fall.  Head CT revealed SDH with midline shift, and hemorrhagic contusion of the right temporal lobe. Underwent craniotomy and hematoma evacuation.  SIGNIFICANT EVENTS / STUDIES:  5/18  Head CT >>> SDH with midline shift, and hemorrhagic contusion of the right temporal lobe 5/18  OR >>> Craniotomy, hematoma evacuation 5/22 CT head>>> evolution of intraparenchymal hemorrhagic contusion or possibly venous infarction in the right temporal lobe with significant right temporal lobe swelling 5/22  OR>> Reexploration of right pterional craniotomy for partial temporal lobectomy and evacuation of right temporal hematoma with implantation of the bone flap in the right lateral abdominal wall cavity, remained intubated post op  5/23 FOB>>>minimal secretions 5/26- improved atx   LINES / TUBES: ETT 5/22>>> Left radial 5/22>>>  CULTURES: BAL 5/23>>>NF BC 5/26>>> 5/26 sputum>>>  ANTIBIOTICS: Cefazolin 5/22>>>5/26 5/26 vanc>>> 5/26 ceftaz>>>  INTERVAL HISTORY:   agitation much better  VITAL SIGNS: Temp:  [98.2 F (36.8 C)-100.2 F (37.9 C)] 98.2 F (36.8 C) (05/27 0751) Pulse Rate:  [56-136] 87 (05/27 0808) Resp:  [14-30] 20 (05/27 0808) BP: (85-158)/(38-80) 139/62 mmHg (05/27 0808) SpO2:  [92 %-100 %] 100 % (05/27 0808) Arterial Line BP: (97-167)/(36-91) 147/52 mmHg (05/27 0800) FiO2 (%):  [40 %] 40 % (05/27 0809) Weight:  [107.5 kg (236 lb 15.9 oz)] 107.5 kg (236 lb 15.9 oz) (05/27 0428)  HEMODYNAMICS:   VENTILATOR SETTINGS: Vent Mode:  [-] CPAP;PSV FiO2 (%):  [40 %] 40 % Set Rate:  [14 bmp] 14 bmp Vt Set:  [650 mL] 650 mL PEEP:  [5 cmH20] 5 cmH20 Pressure Support:  [5  cmH20] 5 cmH20 Plateau Pressure:  [15 cmH20-21 cmH20] 19 cmH20 INTAKE / OUTPUT: Intake/Output     05/26 0701 - 05/27 0700 05/27 0701 - 05/28 0700   I.V. (mL/kg) 528 (4.9) 10 (0.1)   NG/GT 1365 30   IV Piggyback 850    Total Intake(mL/kg) 2743 (25.5) 40 (0.4)   Urine (mL/kg/hr) 1445 (0.6)    Total Output 1445     Net +1298 +40        Stool Occurrence 5 x     PHYSICAL EXAMINATION: General: no distress on weaning Neuro: sedated, RASS 0, calm , follows commands, more alert HEENT: Surgical dressing intact Cardiovascular:  s1 s2 Irregular, no murmurs Lungs:  Ronchi improved, clear anterior Abdomen:  Soft, bowel sounds present Musculoskeletal:  Trace edema Skin:  No rash  LABS:  CBC  Recent Labs Lab 04/26/14 0500 04/27/14 0545 04/28/14 0500  WBC 10.1 9.8 9.1  HGB 10.0* 9.4* 8.5*  HCT 29.8* 28.4* 25.5*  PLT 186 199 204   Coag's  Recent Labs Lab 04/22/14 0300  INR 1.21   BMET  Recent Labs Lab 04/26/14 0500 04/27/14 0545 04/28/14 0500  NA 145 146 147  K 3.8 3.9 4.0  CL 112 110 110  CO2 24 26 26   BUN 18 20 27*  CREATININE 0.92 1.02 1.13  GLUCOSE 180* 132* 115*   Electrolytes  Recent Labs Lab 04/24/14 0400 04/25/14 0430 04/26/14 0500 04/27/14 0500 04/27/14 0545 04/28/14 0500  CALCIUM 8.3* 8.4 8.5  --  8.6 8.4  MG 2.4 2.5 2.4  --   --   --  PHOS 2.5 2.0* 1.9* 2.7  --   --    Sepsis Markers No results found for this basename: LATICACIDVEN, PROCALCITON, O2SATVEN,  in the last 168 hours ABG  Recent Labs Lab 04/24/14 0446  PHART 7.402  PCO2ART 31.5*  PO2ART 91.0   Liver Enzymes  Recent Labs Lab 04/28/14 0500  AST 45*  ALT 30  ALKPHOS 72  BILITOT 0.5  ALBUMIN 2.2*   Cardiac Enzymes No results found for this basename: TROPONINI, PROBNP,  in the last 168 hours Glucose  Recent Labs Lab 04/27/14 1141 04/27/14 1603 04/27/14 1958 04/28/14 0010 04/28/14 0359 04/28/14 0747  GLUCAP 139* 126* 122* 132* 104* 114*    IMAGING:  Ct  Head Wo Contrast  04/28/2014   CLINICAL DATA:  Follow-up intracranial hemorrhage, postoperative evaluation.  EXAM: CT HEAD WITHOUT CONTRAST  TECHNIQUE: Contiguous axial images were obtained from the base of the skull through the vertex without intravenous contrast.  COMPARISON:  CT of the head Apr 23, 2014.  FINDINGS: Status post interval right decompressive craniectomy ; no external herniation of right cerebrum via through the defect. Small amount of right frontal extra-axial pneumocephalus without right extra-axial blood products on this examination. However, right frontal low-density 7 mm extra-axial fluid collection all seen. Larger right scalp low-density fluid collection and air with overlying skin staples.  Interval evacuation of the right temporal intraparenchymal hematoma with minimal residual intraparenchymal blood, surrounding low-density presumed vasogenic and possibly cytotoxic edema, mild right uncal herniation without midline shift. No hydrocephalus. Apparent remote small right cerebellar infarct.  Life support lines in place. Trace paranasal sinus mucosal thickening without air-fluid levels. The mastoid air cells are well aerated. 3 x 13 mm (transverse by AP) extraconal mass within the medial right orbit laterally displaces the medial rectus muscle, axial 13/94. Prominent appearance of the superior ophthalmic veins likely due to positive end pressure.  IMPRESSION: Status post interval right decompressive craniectomy and evacuation of right temporal hematoma with residual low-density vasogenic and likely cytotoxic edema, mild right uncal herniation without midline shift.  Resolution of right extra-axial pneumocephalus and fluid collection, new 7 mm left frontal low-density presumed hygroma.  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  Larger right scalp fluid  collection which may be postoperative (seroma versus less likely pseudomeningocele).   Electronically Signed   By: Elon Alas   On: 04/28/2014 05:24   Dg Chest Port 1 View  04/28/2014   CLINICAL DATA:  ET tube evaluation.  Followup lung opacities.  EXAM: PORTABLE CHEST - 1 VIEW  COMPARISON:  04/27/2014.  FINDINGS: Changes from CABG surgery are stable. Cardiac silhouette is mildly enlarged. Vascular congestion, irregular interstitial opacities and hazy lung base opacity has mildly increased, most evident at the right lung base. There are small effusions, larger on the left. No pneumothorax.  Endotracheal tube tip lies 2.7 cm above the carina. Enteric tube passes into the stomach, below the included field of view  IMPRESSION: Worsened lung aeration with increased irregular interstitial opacities and mild hazy airspace opacity consistent with worsened congestive heart failure.   Electronically Signed   By: Lajean Manes M.D.   On: 04/28/2014 08:05   Dg Chest Portable 1 View  04/27/2014   CLINICAL DATA:  Atelectasis/collapse.  EXAM: PORTABLE CHEST - 1 VIEW  COMPARISON:  04/26/2014 and 04/25/2014.  FINDINGS: 0552 hr. The endotracheal tube and feeding tube  appear unchanged, the tip of the latter not visualized. There is stable cardiomegaly, pulmonary edema and left greater than right basilar airspace opacities. A small left pleural effusion is suspected. Patchy perihilar opacities on the right are stable. There is no pneumothorax.  IMPRESSION: Stable edema and bibasilar airspace opacities.   Electronically Signed   By: Camie Patience M.D.   On: 04/27/2014 08:07   ASSESSMENT / PLAN:  PULMONARY A:  VDRF - in setting ICH with worsening mental status and repeat OR for evolving hemorrhage Atx/collapse L hemithorax -  resolved post bscopy 5/23- resolved Smoker -likely COPD P:   Cont vent support  Wean cpap5 ps 5, goal 1 hr, assess rsbi, alertness is improved Albut/atrovent nebs Upright Allow neg  balanc Repeat pcxr for int edema component  NEUROLOGIC A:   SDH s/p evacuation ICH Hx ETOH  Delirium, alcoholic - improving  P:   Per Neurosurgery Thiamine/Folate Fentanyl gtt - WUA PRN versed   CARDIOVASCULAR A:  HTN AF, controlled rate P:  Labetalol PRN coreg Statin  PO cardizem  tele  RENAL A:   Resolved hypophos P:   KVO IVF  Am chem May need lasix to even goals  GASTROINTESTINAL A:  Nutrition Constipation P:   TF, hold if weaning well Colace If no BM today then will need repeat dulx supp  HEMATOLOGIC A:   Coumadin induced coagulopathy, reversed VTE Px P:  Lasix to concetrate SCD  INFECTIOUS A:   ATX left, fever reoccurrence (central?) P:   ceftaz, vanc, maintain Low threshold dc foley or change if needed  Urine culture  ENDOCRINE A:   Hyperglycemia - no known hx DM P:   SSI   The patient is critically ill with multiple organ systems failure and requires high complexity decision making for assessment and support, frequent evaluation and titration of therapies, application of advanced monitoring technologies and extensive interpretation of multiple databases. Critical Care Time devoted to patient care services described in this note is 35 minutes.  Lavon Paganini. Titus Mould, MD, [place] Pgr: [facility]

## 2014-04-28 NOTE — Procedures (Signed)
Extubation Procedure Note  Patient Details:   Name: Alejandro [name] DOB: [birth year] MRN: [id number]   Airway Documentation:     Evaluation  O2 sats: stable throughout Complications: No apparent complications Patient did tolerate procedure well. Bilateral Breath Sounds: Rhonchi;Diminished Suctioning: Airway Yes Patient extubated to [place]. No complications. RN at bedside. Vital signs stable at this time. RT will continue to monitor.  Gearldine Shown 04/28/2014, 9:26 AM

## 2014-04-28 NOTE — Progress Notes (Signed)
Patient ID: PER Alejandro [name], male   DOB: 07/25/1946, 68 y.o.   MRN: [id number] Patient doing very well neurologically extubated awake alert follows commands x4.  Wound with some clear drainage out of 1 area the posterior aspect of the incision. Suspicious for CSF. CT scan showed fluid collection underneath the scalp to be serosanguineous possible this could represent external hydrocephalus.  I prepped his incision through couple sutures in the defect it was draining I aspirated about 10 cc of serosanguineous fluid out up and the flap and sent for culture Gram stain protein and glucose patient is on antibiotics we'll start some Decadron and will observe.

## 2014-04-28 NOTE — Evaluation (Signed)
Clinical/Bedside Swallow Evaluation Patient Details  Name: Alejandro [name] MRN: [id number] Date of Birth: September 20, 1946  Today's Date: 04/28/2014 Time: 5784-6962 SLP Time Calculation (min): 21 min  Past Medical History:  Past Medical History  Diagnosis Date  . Systolic heart failure   . 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 Dyslipidemia   . HTN (hypertension)   . Atrial fibrillation   . Cancer     around nose  . Tobacco abuse   . Alcohol abuse   . H/O hiatal hernia    Past Surgical History:  Past Surgical History  Procedure Laterality Date  . Coronary artery bypass graft  12/24/2008    4 vessel  . Umbilical hernia repair  2010  . Hernia repair  2012  . Coronary artery bypass graft  2012  . Ventral hernia repair N/A 01/22/2013    Procedure: HERNIA REPAIR VENTRAL ADULT;  Surgeon: [name], MD;  Location: [place];  Service: General;  Laterality: N/A;  . Application of a-cell of chest/abdomen N/A 01/22/2013    Procedure: APPLICATION OF A-CELL OF CHEST/ABDOMEN;  Surgeon: [name], MD;  Location: [facility];  Service: General;  Laterality: N/A;  . Cystoscopy N/A 01/22/2013    Procedure: Consuela Mimes;  Surgeon: [name], MD;  Location: [place] [facility];  Service: Urology;  Laterality: N/A;  Cystoscopy with balloon dilation. Insertion of coude catheter.  . Craniotomy Right 04/19/2014    Procedure: CRANIOTOMY HEMATOMA EVACUATION SUBDURAL;  Surgeon: [name], MD;  Location: [place];  Service: Neurosurgery;  Laterality: Right;  right  . Craniotomy Right 04/23/2014    Procedure: Craniotomy for Intracerebral Hemorrhage;  Surgeon: [name], MD;  Location: [place] [facility];  Service: Neurosurgery;  Laterality: Right;   HPI:  68 yo with AF on Coumadin admitted after fall. Head CT revealed SDH with midline shift, and hemorrhagic contusion of the right temporal lobe. Underwent craniotomy and hematoma evacuation 5/18.  5/22 OR>> Reexploration of right pterional craniotomy for  partial temporal lobectomy and evacuation of right temporal hematoma with implantation of the bone flap in the right lateral abdominal wall cavity, remained intubated post op from 5/22-5/27.  Pt pulled NG tube; clinical swallow eval ordered.     Assessment / Plan / Recommendation Clinical Impression  Pt presents with mild s/s of dysphagia with overall delays, subtle signs of potential aspiration with thin liquids.  No focal CN deficits.  Recommend initiating a dysphagia 1 diet with nectar-thick liquids for now; meds whole in puree.  Suspect quick resolution of swallowing deficits as MS improves.       Aspiration Risk  Mild    Diet Recommendation Dysphagia 1 (Puree);Nectar-thick liquid   Liquid Administration via: Cup Medication Administration: Whole meds with puree Supervision: Full supervision/cueing for compensatory strategies (until mental status allows safe self-feeding) Compensations: Slow rate;Clear throat intermittently Postural Changes and/or Swallow Maneuvers: Seated upright 90 degrees    Other  Recommendations Oral Care Recommendations: Oral care BID   Follow Up Recommendations   (tba)    Frequency and Duration min 2x/week  1 week       SLP Swallow Goals     Swallow Study Prior Functional Status  Cognitive/Linguistic Baseline: Information not available    General Date of Onset: 04/19/14 HPI: 68 yo with AF on Coumadin admitted after fall. Head CT revealed SDH with midline shift, and hemorrhagic contusion of the right temporal lobe. Underwent craniotomy and hematoma  evacuation 5/18.  5/22 OR>> Reexploration of right pterional craniotomy for partial temporal lobectomy and evacuation of right temporal hematoma with implantation of the bone flap in the right lateral abdominal wall cavity, remained intubated post op from 5/22-5/27.  Pt pulled NG tube; clinical swallow eval ordered.   Type of Study: Bedside swallow evaluation Previous Swallow Assessment: none per records Diet  Prior to this Study: NPO Temperature Spikes Noted: No Respiratory Status: Nasal cannula History of Recent Intubation: Yes Length of Intubations (days): 5 days Date extubated: 04/28/14 Behavior/Cognition: Alert;Cooperative;Confused Oral Cavity - Dentition: Edentulous Self-Feeding Abilities: Needs assist Patient Positioning: Upright in bed Baseline Vocal Quality: Hoarse Volitional Cough: Strong Volitional Swallow: Able to elicit    Oral/Motor/Sensory Function Overall Oral Motor/Sensory Function: Appears within functional limits for tasks assessed   Ice Chips Ice chips: Within functional limits Presentation: Spoon   Thin Liquid Thin Liquid: Impaired Presentation: Spoon Oral Phase Functional Implications: Prolonged oral transit Pharyngeal  Phase Impairments: Suspected delayed Swallow;Multiple swallows;Wet Vocal Quality    Nectar Thick Nectar Thick Liquid: Impaired Presentation: Spoon;Cup Pharyngeal Phase Impairments: Suspected delayed Swallow   Honey Thick Honey Thick Liquid: Not tested   Puree Puree: Impaired Presentation: Spoon Oral Phase Functional Implications: Prolonged oral transit Pharyngeal Phase Impairments: Suspected delayed Swallow   Solid  Alejandro [name] Alejandro [name], [place] CCC/SLP Pager [phone]     Solid: Not tested       Alejandro [name] Alejandro [name] 04/28/2014,2:00 PM

## 2014-04-29 ENCOUNTER — Inpatient Hospital Stay (HOSPITAL_COMMUNITY): Payer: BC Managed Care – PPO

## 2014-04-29 DIAGNOSIS — S065XAA Traumatic subdural hemorrhage with loss of consciousness status unknown, initial encounter: Secondary | ICD-10-CM

## 2014-04-29 DIAGNOSIS — S065X9A Traumatic subdural hemorrhage with loss of consciousness of unspecified duration, initial encounter: Secondary | ICD-10-CM

## 2014-04-29 DIAGNOSIS — W19XXXA Unspecified fall, initial encounter: Secondary | ICD-10-CM

## 2014-04-29 LAB — CBC WITH DIFFERENTIAL/PLATELET
BASOS ABS: 0 10*3/uL (ref 0.0–0.1)
Basophils Relative: 0 % (ref 0–1)
EOS ABS: 0 10*3/uL (ref 0.0–0.7)
EOS PCT: 0 % (ref 0–5)
HCT: 30.6 % — ABNORMAL LOW (ref 39.0–52.0)
Hemoglobin: 10.3 g/dL — ABNORMAL LOW (ref 13.0–17.0)
Lymphocytes Relative: 5 % — ABNORMAL LOW (ref 12–46)
Lymphs Abs: 0.6 10*3/uL — ABNORMAL LOW (ref 0.7–4.0)
MCH: 30.4 pg (ref 26.0–34.0)
MCHC: 33.7 g/dL (ref 30.0–36.0)
MCV: 90.3 fL (ref 78.0–100.0)
Monocytes Absolute: 0.3 10*3/uL (ref 0.1–1.0)
Monocytes Relative: 2 % — ABNORMAL LOW (ref 3–12)
Neutro Abs: 11.6 10*3/uL — ABNORMAL HIGH (ref 1.7–7.7)
Neutrophils Relative %: 93 % — ABNORMAL HIGH (ref 43–77)
PLATELETS: 243 10*3/uL (ref 150–400)
RBC: 3.39 MIL/uL — ABNORMAL LOW (ref 4.22–5.81)
RDW: 13.5 % (ref 11.5–15.5)
WBC: 12.5 10*3/uL — ABNORMAL HIGH (ref 4.0–10.5)

## 2014-04-29 LAB — COMPREHENSIVE METABOLIC PANEL
ALK PHOS: 84 U/L (ref 39–117)
ALT: 36 U/L (ref 0–53)
AST: 47 U/L — AB (ref 0–37)
Albumin: 2.5 g/dL — ABNORMAL LOW (ref 3.5–5.2)
BILIRUBIN TOTAL: 0.8 mg/dL (ref 0.3–1.2)
BUN: 26 mg/dL — ABNORMAL HIGH (ref 6–23)
CHLORIDE: 102 meq/L (ref 96–112)
CO2: 28 mEq/L (ref 19–32)
Calcium: 9.1 mg/dL (ref 8.4–10.5)
Creatinine, Ser: 0.97 mg/dL (ref 0.50–1.35)
GFR calc Af Amer: >90 mL/min (ref >90)
GFR calc non Af Amer: 83 mL/min — ABNORMAL LOW (ref >90)
Glucose, Bld: 148 mg/dL — ABNORMAL HIGH (ref 70–99)
POTASSIUM: 4 meq/L (ref 3.7–5.3)
Sodium: 141 mEq/L (ref 137–147)
Total Protein: 6.9 g/dL (ref 6.0–8.3)

## 2014-04-29 LAB — GLUCOSE, CAPILLARY
GLUCOSE-CAPILLARY: 140 mg/dL — AB (ref 70–99)
GLUCOSE-CAPILLARY: 150 mg/dL — AB (ref 70–99)
GLUCOSE-CAPILLARY: 173 mg/dL — AB (ref 70–99)
Glucose-Capillary: 231 mg/dL — ABNORMAL HIGH (ref 70–99)
Glucose-Capillary: 149 mg/dL — ABNORMAL HIGH (ref 70–99)
Glucose-Capillary: 180 mg/dL — ABNORMAL HIGH (ref 70–99)

## 2014-04-29 LAB — PATHOLOGIST SMEAR REVIEW

## 2014-04-29 LAB — VANCOMYCIN, TROUGH: Vancomycin Tr: 17.3 ug/mL (ref 10.0–20.0)

## 2014-04-29 MED ORDER — PANTOPRAZOLE SODIUM 40 MG PO TBEC
40.0000 mg | DELAYED_RELEASE_TABLET | Freq: Every day | ORAL | Status: DC
  Administered 2014-04-29 – 2014-05-03 (×5): 40 mg via ORAL
  Filled 2014-04-29 (×4): qty 1

## 2014-04-29 MED ORDER — ENSURE COMPLETE PO LIQD
237.0000 mL | Freq: Two times a day (BID) | ORAL | Status: DC
  Administered 2014-04-29 – 2014-05-04 (×8): 237 mL via ORAL

## 2014-04-29 MED ORDER — DILTIAZEM HCL 60 MG PO TABS
60.0000 mg | ORAL_TABLET | Freq: Three times a day (TID) | ORAL | Status: DC
  Administered 2014-04-29 – 2014-04-30 (×3): 60 mg via ORAL
  Filled 2014-04-29 (×6): qty 1

## 2014-04-29 MED ORDER — FUROSEMIDE 10 MG/ML IJ SOLN
20.0000 mg | Freq: Every day | INTRAMUSCULAR | Status: DC
  Administered 2014-04-29 – 2014-04-30 (×2): 20 mg via INTRAVENOUS
  Filled 2014-04-29: qty 2

## 2014-04-29 MED ORDER — ADULT MULTIVITAMIN W/MINERALS CH
1.0000 | ORAL_TABLET | Freq: Every day | ORAL | Status: DC
  Administered 2014-04-29 – 2014-05-04 (×6): 1 via ORAL
  Filled 2014-04-29 (×6): qty 1

## 2014-04-29 NOTE — Progress Notes (Signed)
Subjective: Patient reports Doing well no headaches no complaints  Objective: Vital signs in last 24 hours: Temp:  [97 F (36.1 C)-99.2 F (37.3 C)] 98.3 F (36.8 C) (05/28 0337) Pulse Rate:  [37-105] 99 (05/28 0737) Resp:  [8-28] 19 (05/28 0737) BP: (108-160)/(45-131) 160/82 mmHg (05/28 0700) SpO2:  [91 %-100 %] 95 % (05/28 0737) Arterial Line BP: (133-211)/(52-81) 133/58 mmHg (05/27 2100) FiO2 (%):  [40 %] 40 % (05/27 0809) Weight:  [104.4 kg (230 lb 2.6 oz)] 104.4 kg (230 lb 2.6 oz) (05/28 0430)  Intake/Output from previous day: 05/27 0701 - 05/28 0700 In: 938.4 [P.O.:50; I.V.:278.4; NG/GT:60; IV Piggyback:550] Out: 3976 [Urine:3725] Intake/Output this shift:    Awake alert oriented x2 Mousseau extremities well with equal strength flap is soft incision dry see no evidence of drainage however apparently he had one episode where his thickness and staples earlier this e morning and the bed was saturated unclear whether this was spinal fluid from the flap right now I see no evidence of spinal fluid leak patient's blood work and body fluid counts from the aspirate thickness flap although shows a high white count is also very high rate counts I think that's explicable chemistries are not suggestive of infection. We'll continue to observe.  Lab Results:  Recent Labs  04/28/14 0500 04/29/14 0308  WBC 9.1 12.5*  HGB 8.5* 10.3*  HCT 25.5* 30.6*  PLT 204 243   BMET  Recent Labs  04/28/14 0500 04/29/14 0308  NA 147 141  K 4.0 4.0  CL 110 102  CO2 26 28  GLUCOSE 115* 148*  BUN 27* 26*  CREATININE 1.13 0.97  CALCIUM 8.4 9.1    Studies/Results: Ct Head Wo Contrast  04/28/2014   CLINICAL DATA:  Follow-up intracranial hemorrhage, postoperative evaluation.  EXAM: CT HEAD WITHOUT CONTRAST  TECHNIQUE: Contiguous axial images were obtained from the base of the skull through the vertex without intravenous contrast.  COMPARISON:  CT of the head Apr 23, 2014.  FINDINGS: Status post  interval right decompressive craniectomy ; no external herniation of right cerebrum via through the defect. Small amount of right frontal extra-axial pneumocephalus without right extra-axial blood products on this examination. However, right frontal low-density 7 mm extra-axial fluid collection all seen. Larger right scalp low-density fluid collection and air with overlying skin staples.  Interval evacuation of the right temporal intraparenchymal hematoma with minimal residual intraparenchymal blood, surrounding low-density presumed vasogenic and possibly cytotoxic edema, mild right uncal herniation without midline shift. No hydrocephalus. Apparent remote small right cerebellar infarct.  Life support lines in place. Trace paranasal sinus mucosal thickening without air-fluid levels. The mastoid air cells are well aerated. 3 x 13 mm (transverse by AP) extraconal mass within the medial right orbit laterally displaces the medial rectus muscle, axial 13/94. Prominent appearance of the superior ophthalmic veins likely due to positive end pressure.  IMPRESSION: Status post interval right decompressive craniectomy and evacuation of right temporal hematoma with residual low-density vasogenic and likely cytotoxic edema, mild right uncal herniation without midline shift.  Resolution of right extra-axial pneumocephalus and fluid collection, new 7 mm left frontal low-density presumed hygroma.  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  Larger right scalp fluid collection which may be postoperative (seroma versus less likely pseudomeningocele).   Electronically Signed   By: Elon Alas   On: 04/28/2014 05:24  Dg Chest Port 1 View  04/28/2014   CLINICAL DATA:  ET tube evaluation.  Followup lung opacities.  EXAM: PORTABLE CHEST - 1 VIEW  COMPARISON:  04/27/2014.  FINDINGS: Changes  from CABG surgery are stable. Cardiac silhouette is mildly enlarged. Vascular congestion, irregular interstitial opacities and hazy lung base opacity has mildly increased, most evident at the right lung base. There are small effusions, larger on the left. No pneumothorax.  Endotracheal tube tip lies 2.7 cm above the carina. Enteric tube passes into the stomach, below the included field of view  IMPRESSION: Worsened lung aeration with increased irregular interstitial opacities and mild hazy airspace opacity consistent with worsened congestive heart failure.   Electronically Signed   By: Lajean Manes M.D.   On: 04/28/2014 08:05    Assessment/Plan: Mobilization today continue to observe wound for any additional drainage continued IV antibiotics continued Decadron for now okay for rehabilitation tomorrow if scalp stays dry.  LOS: 10 days     [name] 04/29/2014, 7:44 AM

## 2014-04-29 NOTE — Progress Notes (Signed)
PULMONARY / CRITICAL CARE MEDICINE   Name: HAIDYN CHADDERDON MRN: [id number] DOB: 1946-10-22    ADMISSION DATE:  04/19/2014 CONSULTATION DATE:  04/19/2014  REFERRING MD :  EDP PRIMARY SERVICE: PCCM  CHIEF COMPLAINT:  VDRF post-op crani for SDH  BRIEF PATIENT DESCRIPTION: 68 yo with AF on Coumadin admitted after fall.  Head CT revealed SDH with midline shift, and hemorrhagic contusion of the right temporal lobe. Underwent craniotomy and hematoma evacuation.  SIGNIFICANT EVENTS / STUDIES:  5/18  Head CT >>> SDH with midline shift, and hemorrhagic contusion of the right temporal lobe 5/18  OR >>> Craniotomy, hematoma evacuation 5/22 CT head>>> evolution of intraparenchymal hemorrhagic contusion or possibly venous infarction in the right temporal lobe with significant right temporal lobe swelling 5/22  OR>> Reexploration of right pterional craniotomy for partial temporal lobectomy and evacuation of right temporal hematoma with implantation of the bone flap in the right lateral abdominal wall cavity, remained intubated post op  5/23 FOB>>>minimal secretions 5/26- improved atx  5/27- extubated 5/27- NS drained csf collection percutaneously   LINES / TUBES: ETT 5/22>>>5/27 Left radial 5/22>>>5/27  CULTURES: BAL 5/23>>>NF BC 5/26>>> 5/26 sputum>>> 5/27 csf>>>  ANTIBIOTICS: Cefazolin 5/22>>>5/26 5/26 vanc>>> 5/26 ceftaz>>>  INTERVAL HISTORY:   Agitation, no distress  VITAL SIGNS: Temp:  [97 F (36.1 C)-99.2 F (37.3 C)] 98.7 F (37.1 C) (05/28 0800) Pulse Rate:  [37-105] 85 (05/28 0800) Resp:  [8-28] 12 (05/28 0800) BP: (108-160)/(45-131) 154/68 mmHg (05/28 0800) SpO2:  [91 %-100 %] 96 % (05/28 0800) Arterial Line BP: (133-211)/(53-81) 133/58 mmHg (05/27 2100) Weight:  [104.4 kg (230 lb 2.6 oz)] 104.4 kg (230 lb 2.6 oz) (05/28 0430)  HEMODYNAMICS:   VENTILATOR SETTINGS:   INTAKE / OUTPUT: Intake/Output     05/27 0701 - 05/28 0700 05/28 0701 - 05/29 0700   P.O. 50    I.V. (mL/kg) 278.4 (2.7)    NG/GT 60    IV Piggyback 550    Total Intake(mL/kg) 938.4 (9)    Urine (mL/kg/hr) 3725 (1.5) 290 (1.5)   Total Output 3725 290   Net -2786.6 -290         PHYSICAL EXAMINATION: General: no distress Neuro: awake, agitated HEENT: Surgical dressing intact, dry Cardiovascular:  s1 s2 Irregular, no murmurs Lungs:  Coarse mild Abdomen:  Soft, bowel sounds present Musculoskeletal:  Trace edema Skin:  No rash  LABS:  CBC  Recent Labs Lab 04/27/14 0545 04/28/14 0500 04/29/14 0308  WBC 9.8 9.1 12.5*  HGB 9.4* 8.5* 10.3*  HCT 28.4* 25.5* 30.6*  PLT 199 204 243   Coag's No results found for this basename: APTT, INR,  in the last 168 hours BMET  Recent Labs Lab 04/27/14 0545 04/28/14 0500 04/29/14 0308  NA 146 147 141  K 3.9 4.0 4.0  CL 110 110 102  CO2 26 26 28   BUN 20 27* 26*  CREATININE 1.02 1.13 0.97  GLUCOSE 132* 115* 148*   Electrolytes  Recent Labs Lab 04/24/14 0400 04/25/14 0430 04/26/14 0500 04/27/14 0500 04/27/14 0545 04/28/14 0500 04/29/14 0308  CALCIUM 8.3* 8.4 8.5  --  8.6 8.4 9.1  MG 2.4 2.5 2.4  --   --   --   --   PHOS 2.5 2.0* 1.9* 2.7  --   --   --    Sepsis Markers No results found for this basename: LATICACIDVEN, PROCALCITON, O2SATVEN,  in the last 168 hours ABG  Recent Labs Lab 04/24/14 0446  PHART 7.402  PCO2ART 31.5*  PO2ART 91.0   Liver Enzymes  Recent Labs Lab 04/28/14 0500 04/29/14 0308  AST 45* 47*  ALT 30 36  ALKPHOS 72 84  BILITOT 0.5 0.8  ALBUMIN 2.2* 2.5*   Cardiac Enzymes No results found for this basename: TROPONINI, PROBNP,  in the last 168 hours Glucose  Recent Labs Lab 04/28/14 1135 04/28/14 1616 04/28/14 2007 04/28/14 2349 04/29/14 0336 04/29/14 0752  GLUCAP 121* 147* 144* 173* 140* 150*    IMAGING:  Ct Head Wo Contrast  04/28/2014   CLINICAL DATA:  Follow-up intracranial hemorrhage, postoperative evaluation.  EXAM: CT HEAD WITHOUT CONTRAST  TECHNIQUE: Contiguous  axial images were obtained from the base of the skull through the vertex without intravenous contrast.  COMPARISON:  CT of the head Apr 23, 2014.  FINDINGS: Status post interval right decompressive craniectomy ; no external herniation of right cerebrum via through the defect. Small amount of right frontal extra-axial pneumocephalus without right extra-axial blood products on this examination. However, right frontal low-density 7 mm extra-axial fluid collection all seen. Larger right scalp low-density fluid collection and air with overlying skin staples.  Interval evacuation of the right temporal intraparenchymal hematoma with minimal residual intraparenchymal blood, surrounding low-density presumed vasogenic and possibly cytotoxic edema, mild right uncal herniation without midline shift. No hydrocephalus. Apparent remote small right cerebellar infarct.  Life support lines in place. Trace paranasal sinus mucosal thickening without air-fluid levels. The mastoid air cells are well aerated. 3 x 13 mm (transverse by AP) extraconal mass within the medial right orbit laterally displaces the medial rectus muscle, axial 13/94. Prominent appearance of the superior ophthalmic veins likely due to positive end pressure.  IMPRESSION: Status post interval right decompressive craniectomy and evacuation of right temporal hematoma with residual low-density vasogenic and likely cytotoxic edema, mild right uncal herniation without midline shift.  Resolution of right extra-axial pneumocephalus and fluid collection, new 7 mm left frontal low-density presumed hygroma.  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  Larger right scalp fluid collection which may be postoperative (seroma versus less likely pseudomeningocele).   Electronically Signed   By: Elon Alas   On: 04/28/2014 05:24   Dg  Chest Port 1 View  04/29/2014   CLINICAL DATA:  Edema, followup  EXAM: PORTABLE CHEST - 1 VIEW  COMPARISON:  Portable chest x-ray of 04/28/2014  FINDINGS: The endotracheal tube has been removed as has the feeding tube. Aeration has improved and there has been improvement in the degree of pulmonary vascular congestion. Cardiomegaly is stable. Median sternotomy sutures are noted.  IMPRESSION: Endotracheal tube removed. Improved aeration with improvement in pulmonary vascular congestion.   Electronically Signed   By: Ivar Drape M.D.   On: 04/29/2014 08:10   Dg Chest Port 1 View  04/28/2014   CLINICAL DATA:  ET tube evaluation.  Followup lung opacities.  EXAM: PORTABLE CHEST - 1 VIEW  COMPARISON:  04/27/2014.  FINDINGS: Changes from CABG surgery are stable. Cardiac silhouette is mildly enlarged. Vascular congestion, irregular interstitial opacities and hazy lung base opacity has mildly increased, most evident at the right lung base. There are small effusions, larger on the left. No pneumothorax.  Endotracheal tube tip lies 2.7 cm above the carina. Enteric tube passes into the stomach, below the included field of view  IMPRESSION: Worsened lung aeration with increased irregular interstitial opacities and mild hazy  airspace opacity consistent with worsened congestive heart failure.   Electronically Signed   By: Lajean Manes M.D.   On: 04/28/2014 08:05   ASSESSMENT / PLAN:  PULMONARY A:  VDRF - in setting ICH with worsening mental status and repeat OR for evolving hemorrhage Atx/collapse L hemithorax -  resolved post bscopy 5/23- resolved Smoker -likely COPD P:   IS when able Albut/atrovent nebs Upright pcxr improved, neg balance improved him  NEUROLOGIC A:   SDH s/p evacuation ICH Hx ETOH  Delirium, alcoholic - improving  csgf collection drained 5/27 P:   Per Neurosurgery Thiamine/Folate Fentanyl gtt - WUA Dc versed If needed, would add precedex  CARDIOVASCULAR A:  HTN AF, controlled  rate Able to take PO P:  Labetalol coreg Statin  PO cardizem increase Tele Will consider lower MAP goals   RENAL A:   Resolved hypophos hypervolemia P:   KVO IVF  Am chem Lasix slight reduction  GASTROINTESTINAL A:  Nutrition Constipation P:   Diet  Colace dulc supp prn  HEMATOLOGIC A:   Coumadin induced coagulopathy, reversed VTE Px P:  hemoconctrated SCD  INFECTIOUS A:   ATX left, fever reoccurrence (central?) - improved Csf collection, likely sterile P:   ceftaz, vanc, maintain, especially with csf just sent off Foley is out  ENDOCRINE A:   Hyperglycemia - no known hx DM P:   SSI   Matsue Strom J. Titus Mould, MD, [place] Pgr: [facility]

## 2014-04-29 NOTE — Consult Note (Signed)
Physical Medicine and Rehabilitation Consult Reason for Consult:SDH Referring Physician: DR Nelda Marseille   HPI: Alejandro [name] is a 68 y.o.right handed male with A fib on chronic coumadin,CAD with CABG alcohol and tobacco abuse. Admitted 04/19/2014 after noted fall with laceration on his head. Cranial CT scan showed subdural hematoma with midline shift, hemorrhagic contusion on the right temporal lobe as well as right matter disease. INR on admission of 2.5 and reversed. Underwent right craniotomy for evacuation of acute subdural hematoma 04/19/2014 per Dr. Saintclair Halsted. Maintained on [place]gard for seizure prophylaxis. Initial postoperative CT scan stable with again repeat serial cranial CT scan 04/23/2014 showing expansion of right temporal intracerebral hematoma and possible venous infarction with temporal lobe swelling. Underwent reexploration of right craniotomy for partial temporal lobectomy and evacuation of right temporal hematoma with implantation of a bone flap in the right lateral abdominal wall cavity 04/23/2014. Patient remained intubated until 04/28/2014. 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 Wound culture Gram stain pending placed on empiric vancomycin as well as Tressie Ellis. Currently on a dysphagia 1 nectar thick liquid diet. Await formal followup physical and occupational therapy.  M.D. has requested physical medicine rehabilitation consult   Review of Systems  Cardiovascular: Positive for palpitations.  Musculoskeletal: Positive for falls and myalgias.  All other systems reviewed and are negative.  Past Medical History  Diagnosis Date  . Systolic heart failure   . 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 Dyslipidemia   . HTN  (hypertension)   . Atrial fibrillation   . Cancer     around nose  . Tobacco abuse   . Alcohol abuse   . H/O hiatal hernia    Past Surgical History  Procedure Laterality Date  . Coronary artery bypass graft  12/24/2008    4 vessel  . Umbilical hernia repair  2010  . Hernia repair  2012  . Coronary artery bypass graft  2012  . Ventral hernia repair N/A 01/22/2013    Procedure: HERNIA REPAIR VENTRAL ADULT;  Surgeon: [name], MD;  Location: [facility];  Service: General;  Laterality: N/A;  . Application of a-cell of chest/abdomen N/A 01/22/2013    Procedure: APPLICATION OF A-CELL OF CHEST/ABDOMEN;  Surgeon: [name], MD;  Location: [place];  Service: General;  Laterality: N/A;  . Cystoscopy N/A 01/22/2013    Procedure: Consuela Mimes;  Surgeon: [name], MD;  Location: [place];  Service: Urology;  Laterality: N/A;  Cystoscopy with balloon dilation. Insertion of coude catheter.  . Craniotomy Right 04/19/2014    Procedure: CRANIOTOMY HEMATOMA EVACUATION SUBDURAL;  Surgeon: [name], MD;  Location: [place] [facility];  Service: Neurosurgery;  Laterality: Right;  right  . Craniotomy Right 04/23/2014    Procedure: Craniotomy for Intracerebral Hemorrhage;  Surgeon: [name], MD;  Location: [place] [facility];  Service: Neurosurgery;  Laterality: Right;   Family History  Problem Relation Age of Onset  . Diabetes Mother   . Heart disease Mother   . Diabetes Father   . Heart disease Father   . Heart disease Brother     s/p CABG at 60  . Colon cancer Neg Hx   . Prostate cancer Neg Hx    Social  History:  reports that he has been smoking Cigarettes.  He has a 50 pack-year smoking history. He has never used smokeless tobacco. He reports that he drinks about 6 ounces of alcohol per week. He reports that he does not use illicit drugs. Allergies: No Known Allergies Medications Prior to Admission  Medication Sig Dispense Refill  . carvedilol (COREG) 25 MG tablet Take 1 tablet (25 mg total) by mouth  2 (two) times daily with a meal.  60 tablet  3  . furosemide (LASIX) 80 MG tablet Take 0.5 tablets (40 mg total) by mouth daily.  45 tablet  1  . ibuprofen (ADVIL,MOTRIN) 200 MG tablet Take 200 mg by mouth every 6 (six) hours as needed for pain.       Marland Kitchen lisinopril (PRINIVIL,ZESTRIL) 20 MG tablet TAKE 1 TABLET EVERY DAY  90 tablet  1  . simvastatin (ZOCOR) 40 MG tablet Take 40 mg by mouth every evening.      . warfarin (COUMADIN) 2 MG tablet Take 2-4 mg by mouth daily. 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 pt able to move LEs to EOB, used bedrails to assist Transfers Overall transfer level: Needs assistance Equipment used: 1 person hand held assist Transfers: Sit to/from Stand Sit to Stand: Min assist General transfer comment: Pt reliant on UE assist  Ambulation/Gait Ambulation/Gait assistance: Mod assist;+2 safety/equipment Ambulation Distance (Feet): 25 Feet Assistive device: Rolling walker (2 wheeled) Gait Pattern/deviations: Step-through pattern;Decreased stride length;Shuffle;Narrow base of support;Trunk flexed;Drifts right/left Gait velocity: slow General Gait Details: pt with apparent L sided neglect/inattention requiring max v/c's to manage walker around obstacles/doorway. Pt with onset of fatigue    ADL: ADL Overall ADL's : Needs assistance/impaired Eating/Feeding: Set up;Supervision/  safety;Sitting Grooming: Wash/dry hands;Wash/dry face;Supervision/safety;Sitting Upper Body Bathing: Minimal assitance;Sitting Lower Body Bathing: Moderate assistance;Sit to/from stand Upper Body Dressing : Minimal assistance;Sitting Lower Body Dressing: Moderate assistance;Sit to/from stand Lower Body Dressing Details (indicate cue type and reason): able to don/doff socks in sitting with supervision.  Requires assist with balance and pulling pants over hips  Toilet Transfer: Moderate assistance;Stand-pivot;BSC Toileting- Clothing Manipulation and Hygiene: Moderate assistance Functional mobility during ADLs: Moderate assistance (basic transfers) General ADL Comments: Pt slow to respond and lethargic throughout OT eval   Cognition: Cognition Overall Cognitive Status: Impaired/Different from baseline Orientation Level: Oriented to person;Disoriented to place;Disoriented to situation;Oriented to time Cognition Arousal/Alertness: Lethargic Behavior During Therapy: Flat affect Overall Cognitive Status: Impaired/Different from baseline Area of Impairment: Orientation;Problem solving Orientation Level: Disoriented to;Time (reliant on calendar) Problem Solving: Slow processing;Requires verbal cues General Comments: Pt lethargic during OT eval.  Pt able to read clock correctly, and able to describe events of day.  He demonstrates intellectual awareness  Blood pressure 110/87, pulse 94, temperature 98.3 F (36.8 C), temperature source Axillary, resp. rate 15, height 5\' 8"  (1.727 m), weight 104.4 kg (230 lb 2.6 oz), SpO2 97.00%. Physical Exam  Eyes:  Pupils round and reactive to light  Neck: Normal range of motion. Neck  supple. No thyromegaly present.  Cardiovascular:  Cardiac rate controlled  Respiratory: Effort normal and breath sounds normal. No respiratory distress.  GI: Soft. Bowel sounds are normal. He exhibits no distension.  Flap site dressed/intact  Neurological: He is alert.    Makes good eye contact with examiner.needs multi cues for place but was appropriate age.Bilat mittens in place.Followed simple commands. Moves all 4 limbs with ease. Confabulates/language of confusion. Can follow simple commands. Has minimal insight and awareness.   Skin:  Cranio site with clips and no visible drainage.    Results for orders placed during the hospital encounter of 04/19/14 (from the past 24 hour(s))  GLUCOSE, CAPILLARY     Status: Abnormal   Collection Time    04/28/14  7:47 AM      Result Value Ref Range   Glucose-Capillary 114 (*) 70 - 99 mg/dL  PROTEIN AND GLUCOSE, CSF     Status: Abnormal   Collection Time    04/28/14 10:10 AM      Result Value Ref Range   Glucose, CSF 71  43 - 76 mg/dL   Total  Protein, CSF >600 (*) 15 - 45 mg/dL  CSF CELL COUNT WITH DIFFERENTIAL     Status: Abnormal   Collection Time    04/28/14 10:10 AM      Result Value Ref Range   Tube # SUBMITTED IN CUP     Color, CSF RED (*) COLORLESS   Appearance, CSF TURBID (*) CLEAR   Supernatant XANTHOCHROMIC     RBC Count, CSF 24850 (*) 0 /cu mm   WBC, CSF 128 (*) 0 - 5 /cu mm   Segmented Neutrophils-CSF 90 (*) 0 - 6 %   Lymphs, CSF 8 (*) 40 - 80 %   Monocyte-Macrophage-Spinal Fluid 2 (*) 15 - 45 %   Eosinophils, CSF 0  0 - 1 %  CSF CULTURE     Status: None   Collection Time    04/28/14 10:10 AM      Result Value Ref Range   Specimen Description CSF     Special Requests 1.0ML CSF FLUID     Gram Stain       Value: MODERATE WBC PRESENT,BOTH PMN AND MONONUCLEAR     NO ORGANISMS SEEN     Performed at [facility]     Performed at [facility]   Culture PENDING     Report Status PENDING    GRAM STAIN     Status: None   Collection Time    04/28/14 10:10 AM      Result Value Ref Range   Specimen Description CSF     Special Requests 1.0ML CSF FLUID     Gram Stain       Value: DIRECT SMEAR     MODERATE WBC PRESENT,BOTH PMN AND MONONUCLEAR     NO ORGANISMS SEEN   Report  Status 04/28/2014 FINAL    GLUCOSE, CAPILLARY     Status: Abnormal   Collection Time    04/28/14 11:35 AM      Result Value Ref Range   Glucose-Capillary 121 (*) 70 - 99 mg/dL  GLUCOSE, CAPILLARY     Status: Abnormal   Collection Time    04/28/14  4:16 PM      Result Value Ref Range   Glucose-Capillary 147 (*) 70 - 99 mg/dL   Comment 1 Notify RN     Comment 2 Documented in Chart    GLUCOSE, CAPILLARY  Status: Abnormal   Collection Time    04/28/14  8:07 PM      Result Value Ref Range   Glucose-Capillary 144 (*) 70 - 99 mg/dL   Comment 1 Notify RN     Comment 2 Documented in Chart    GLUCOSE, CAPILLARY     Status: Abnormal   Collection Time    04/28/14 11:49 PM      Result Value Ref Range   Glucose-Capillary 173 (*) 70 - 99 mg/dL   Comment 1 Documented in Chart     Comment 2 Notify RN    COMPREHENSIVE METABOLIC PANEL     Status: Abnormal   Collection Time    04/29/14  3:08 AM      Result Value Ref Range   Sodium 141  137 - 147 mEq/L   Potassium 4.0  3.7 - 5.3 mEq/L   Chloride 102  96 - 112 mEq/L   CO2 28  19 - 32 mEq/L   Glucose, Bld 148 (*) 70 - 99 mg/dL   BUN 26 (*) 6 - 23 mg/dL   Creatinine, Ser 0.97  0.50 - 1.35 mg/dL   Calcium 9.1  8.4 - 10.5 mg/dL   Total Protein 6.9  6.0 - 8.3 g/dL   Albumin 2.5 (*) 3.5 - 5.2 g/dL   AST 47 (*) 0 - 37 U/L   ALT 36  0 - 53 U/L   Alkaline Phosphatase 84  39 - 117 U/L   Total Bilirubin 0.8  0.3 - 1.2 mg/dL   GFR calc non Af Amer 83 (*) >90 mL/min   GFR calc Af Amer >90  >90 mL/min  CBC WITH DIFFERENTIAL     Status: Abnormal   Collection Time    04/29/14  3:08 AM      Result Value Ref Range   WBC 12.5 (*) 4.0 - 10.5 K/uL   RBC 3.39 (*) 4.22 - 5.81 MIL/uL   Hemoglobin 10.3 (*) 13.0 - 17.0 g/dL   HCT 30.6 (*) 39.0 - 52.0 %   MCV 90.3  78.0 - 100.0 fL   MCH 30.4  26.0 - 34.0 pg   MCHC 33.7  30.0 - 36.0 g/dL   RDW 13.5  11.5 - 15.5 %   Platelets 243  150 - 400 K/uL   Neutrophils Relative % 93 (*) 43 - 77 %   Neutro Abs  11.6 (*) 1.7 - 7.7 K/uL   Lymphocytes Relative 5 (*) 12 - 46 %   Lymphs Abs 0.6 (*) 0.7 - 4.0 K/uL   Monocytes Relative 2 (*) 3 - 12 %   Monocytes Absolute 0.3  0.1 - 1.0 K/uL   Eosinophils Relative 0  0 - 5 %   Eosinophils Absolute 0.0  0.0 - 0.7 K/uL   Basophils Relative 0  0 - 1 %   Basophils Absolute 0.0  0.0 - 0.1 K/uL  GLUCOSE, CAPILLARY     Status: Abnormal   Collection Time    04/29/14  3:36 AM      Result Value Ref Range   Glucose-Capillary 140 (*) 70 - 99 mg/dL   Comment 1 Documented in Chart     Comment 2 Notify RN     Ct Head Wo Contrast  04/28/2014   CLINICAL DATA:  Follow-up intracranial hemorrhage, postoperative evaluation.  EXAM: CT HEAD WITHOUT CONTRAST  TECHNIQUE: Contiguous axial images were obtained from the base of the skull through the vertex without intravenous contrast.  COMPARISON:  CT of  the head Apr 23, 2014.  FINDINGS: Status post interval right decompressive craniectomy ; no external herniation of right cerebrum via through the defect. Small amount of right frontal extra-axial pneumocephalus without right extra-axial blood products on this examination. However, right frontal low-density 7 mm extra-axial fluid collection all seen. Larger right scalp low-density fluid collection and air with overlying skin staples.  Interval evacuation of the right temporal intraparenchymal hematoma with minimal residual intraparenchymal blood, surrounding low-density presumed vasogenic and possibly cytotoxic edema, mild right uncal herniation without midline shift. No hydrocephalus. Apparent remote small right cerebellar infarct.  Life support lines in place. Trace paranasal sinus mucosal thickening without air-fluid levels. The mastoid air cells are well aerated. 3 x 13 mm (transverse by AP) extraconal mass within the medial right orbit laterally displaces the medial rectus muscle, axial 13/94. Prominent appearance of the superior ophthalmic veins likely due to positive end pressure.   IMPRESSION: Status post interval right decompressive craniectomy and evacuation of right temporal hematoma with residual low-density vasogenic and likely cytotoxic edema, mild right uncal herniation without midline shift.  Resolution of right extra-axial pneumocephalus and fluid collection, new 7 mm left frontal low-density presumed hygroma.  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  Larger right scalp fluid collection which may be postoperative (seroma versus less likely pseudomeningocele).   Electronically Signed   By: Elon Alas   On: 04/28/2014 05:24   Dg Chest Port 1 View  04/28/2014   CLINICAL DATA:  ET tube evaluation.  Followup lung opacities.  EXAM: PORTABLE CHEST - 1 VIEW  COMPARISON:  04/27/2014.  FINDINGS: Changes from CABG surgery are stable. Cardiac silhouette is mildly enlarged. Vascular congestion, irregular interstitial opacities and hazy lung base opacity has mildly increased, most evident at the right lung base. There are small effusions, larger on the left. No pneumothorax.  Endotracheal tube tip lies 2.7 cm above the carina. Enteric tube passes into the stomach, below the included field of view  IMPRESSION: Worsened lung aeration with increased irregular interstitial opacities and mild hazy airspace opacity consistent with worsened congestive heart failure.   Electronically Signed   By: Lajean Manes M.D.   On: 04/28/2014 08:05    Assessment/Plan: Diagnosis: right temporal contusion/SDH s/p craniotomy and then craniectomy/lobectomy 1. Does the need for close, 24 hr/day medical supervision in concert with the patient's rehab needs make it unreasonable for this patient to be served in a less intensive setting? Yes 2. Co-Morbidities requiring supervision/potential complications: htn, afib, wound care 3. Due to bladder management, bowel  management, safety, skin/wound care, disease management, medication administration and patient education, does the patient require 24 hr/day rehab nursing? Yes 4. Does the patient require coordinated care of a physician, rehab nurse, PT (1-2 hrs/day, 5 days/week), OT (1-2 hrs/day, 5 days/week) and SLP (1-2 hrs/day, 5 days/week) to address physical and functional deficits in the context of the above medical diagnosis(es)? Yes Addressing deficits in the following areas: balance, endurance, locomotion, strength, transferring, bowel/bladder control, bathing, dressing, feeding, grooming, toileting, cognition, speech, language, swallowing and psychosocial support 5. Can the patient actively participate in an intensive therapy program of at least 3 hrs of therapy per day at least 5 days per week? Yes 6. The potential for patient to make measurable gains while on inpatient rehab is excellent 7. Anticipated functional outcomes upon discharge from inpatient rehab are supervision  with  PT, supervision with OT, min assist and mod assist with SLP. 8. Estimated rehab length of stay to reach the above functional goals is: 13-18 days 9. Does the patient have adequate social supports to accommodate these discharge functional goals? Yes and Potentially 10. Anticipated D/C setting: Home 11. Anticipated post D/C treatments: HH therapy and Outpatient therapy 12. Overall Rehab/Functional Prognosis: excellent  RECOMMENDATIONS: This patient's condition is appropriate for continued rehabilitative care in the following setting: CIR Patient has agreed to participate in recommended program. N/A Note that insurance prior authorization may be required for reimbursement for recommended care.  Comment: Rehab Admissions Coordinator to follow up.  Thanks,  Meredith Staggers, MD, Mellody Drown     04/29/2014

## 2014-04-29 NOTE — Progress Notes (Signed)
Patient refusing CPT at this time. I did talk him into the breathing treatment and he is receiving that now. Will try CPT again at next rounds.

## 2014-04-29 NOTE — Evaluation (Signed)
Physical Therapy Evaluation Patient Details Name: Alejandro [name] MRN: [id number] DOB: 1946-04-06 Today's Date: 04/29/2014   History of Present Illness  68 yo with AF on Coumadin admitted after fall.  Head CT revealed SDH with midline shift, and hemorrhagic contusion of the right temporal lobe. Underwent craniotomy and hematoma evacuation on 5/18. Extubated 5/27.  Clinical Impression  Patient re-evaluated post extubation. Patient with deficits in cognition and mobility as indicated below. Will need continued skilled PT to address deficits. Recommend CIR upon acute discharge.    Follow Up Recommendations CIR    Equipment Recommendations  None recommended by PT    Recommendations for Other Services Rehab consult     Precautions / Restrictions Precautions Precautions: Fall Restrictions Weight Bearing Restrictions: No      Mobility  Bed Mobility Overal bed mobility: Needs Assistance Bed Mobility: Supine to Sit     Supine to sit: Max assist;+2 for safety/equipment;HOB elevated     General bed mobility comments: VCs for positioning and sequencing, moderate assist tot come to EOB but max assist for safety and impuslivity, patient trying to impulsively throw himself back down onto his side stating that he can't do it right now, then confabulating as to why.  Transfers Overall transfer level: Needs assistance Equipment used: 2 person hand held assist Transfers: Sit to/from Stand (performed x3) Sit to Stand: Mod assist;+2 physical assistance         General transfer comment: Pt reliant on UE assist and support bilaterally from therapist and tech.   Ambulation/Gait             General Gait Details: unsafe to assess at this time  Stairs            Wheelchair Mobility    Modified Rankin (Stroke Patients Only)       Balance Overall balance assessment: Needs assistance Sitting-balance support: Feet supported Sitting balance-Leahy Scale: Poor Sitting balance -  Comments: pt impulsively moving about while in sitting, could not maintain sitting posture without assist (minimal) and at times maximal assist for safety   Standing balance support: Bilateral upper extremity supported;During functional activity (bilateral +2 support) Standing balance-Leahy Scale: Zero                               Pertinent Vitals/Pain Bp 144/62 Spo2 90 on room air with activity, BP post activity 155/91 spO2 92% on room air    Home Living Family/patient expects to be discharged to:: Inpatient rehab Living Arrangements: Spouse/significant other               Additional Comments: pt was indep and worked full time    Prior Function Level of Independence: Independent         Comments: Pt worked as an Pharmacist, 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, sport and exercise   Dominant Hand: Right    Extremity/Trunk Assessment               Lower Extremity Assessment: Generalized weakness (unable to manually assess secondary to patient confusion)      Cervical / Trunk Assessment: Normal  Communication   Communication: No difficulties  Cognition Arousal/Alertness: Lethargic Behavior During Therapy: Flat affect;Impulsive Overall Cognitive Status: Impaired/Different from baseline Area of Impairment: Orientation;Attention;Memory;Safety/judgement;Awareness;Problem solving Orientation Level: Disoriented to;Time (reliant on calendar) Current Attention Level: Focused Memory: Decreased short-term memory   Safety/Judgement: Decreased awareness of safety;Decreased awareness of deficits Awareness: Intellectual Problem Solving: Slow  processing;Requires verbal cues General Comments: pt confabulating throughout session about being in the [employer] and trying was pointing to the waires and monitor box stating that 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 Pt exttremely impulsive during session, kept trying to stand on his  own, then patient would 'throw' himself down into the bed and try to lay down despite cues to sit EOB as well as for safety and line management.     General Comments      Exercises        Assessment/Plan    PT Assessment Patient needs continued PT services  PT Diagnosis Difficulty walking   PT Problem List Decreased strength;Decreased activity tolerance;Decreased balance;Decreased mobility;Decreased knowledge of use of DME;Decreased safety awareness  PT Treatment Interventions DME instruction;Gait training;Functional mobility training;Therapeutic activities;Therapeutic exercise;Balance training;Neuromuscular re-education   PT Goals (Current goals can be found in the Care Plan section) Acute Rehab PT Goals Patient Stated Goal: to get better PT Goal Formulation: With patient Time For Goal Achievement: 05/18/14 Potential to Achieve Goals: Good    Frequency Min 4X/week   Barriers to discharge        Co-evaluation               End of Session Equipment Utilized During Treatment: Gait belt Activity Tolerance: Patient limited by fatigue Patient left: in bed;with call bell/phone within reach;with bed alarm set;with restraints reapplied Nurse Communication: Mobility status         Time: 3151-7616 PT Time Calculation (min): 19 min   Charges:   PT Evaluation $PT Re-evaluation: 1 Procedure PT Treatments $Therapeutic Activity: 8-22 mins   PT G Codes:          Duncan Dull 04/29/2014, 2:09 PM Alben Deeds, [place] DPT  [phone]

## 2014-04-29 NOTE — Progress Notes (Signed)
ANTIBIOTIC CONSULT NOTE - FOLLOW UP  Pharmacy Consult for vancomycin, Tressie Ellis Indication: rule out sepsis  No Known Allergies  Patient Measurements: Height: 5\' 8"  (172.7 cm) Weight: 230 lb 2.6 oz (104.4 kg) IBW/kg (Calculated) : 68.4 Vital Signs: Temp: 98.7 F (37.1 C) (05/28 0800) Temp src: Oral (05/28 0800) BP: 152/54 mmHg (05/28 1000) Pulse Rate: 96 (05/28 1113) Intake/Output from previous day: 05/27 0701 - 05/28 0700 In: 938.4 [P.O.:50; I.V.:278.4; NG/GT:60; IV Piggyback:550] Out: 3725 [LEXNT:7001] Intake/Output from this shift: Total I/O In: 60 [I.V.:10; IV Piggyback:50] Out: 290 [Urine:290]  Labs:  Recent Labs  04/27/14 0545 04/28/14 0500 04/29/14 0308  WBC 9.8 9.1 12.5*  HGB 9.4* 8.5* 10.3*  PLT 199 204 243  CREATININE 1.02 1.13 0.97   Estimated Creatinine Clearance: 85.4 ml/min (by C-G formula based on Cr of 0.97).  Recent Labs  04/29/14 1045  [place] 17.3     Microbiology: Recent Results (from the past 720 hour(s))  MRSA PCR SCREENING     Status: None   Collection Time    04/19/14  3:14 PM      Result Value Ref Range Status   MRSA by PCR NEGATIVE  NEGATIVE Final   Comment:            The GeneXpert MRSA Assay (FDA     approved for NASAL specimens     only), is one component of a     comprehensive MRSA colonization     surveillance program. It is not     intended to diagnose MRSA     infection nor to guide or     monitor treatment for     MRSA infections.  CULTURE, BAL-QUANTITATIVE     Status: None   Collection Time    04/24/14  1:45 PM      Result Value Ref Range Status   Specimen Description BRONCHIAL ALVEOLAR LAVAGE   Final   Special Requests NONE   Final   Gram Stain     Final   Value: RARE WBC PRESENT,BOTH PMN AND MONONUCLEAR     NO SQUAMOUS EPITHELIAL CELLS SEEN     NO ORGANISMS SEEN     Performed at [facility]     Final   Value: 2,000 COLONIES/ML     Performed at [facility]   Culture      Final   Value: Non-Pathogenic Oropharyngeal-type Flora Isolated.     Performed at [facility]   Report Status 04/26/2014 FINAL   Final  CULTURE, BLOOD (ROUTINE X 2)     Status: None   Collection Time    04/27/14 11:22 AM      Result Value Ref Range Status   Specimen Description BLOOD RIGHT HAND   Final   Special Requests BOTTLES DRAWN AEROBIC AND ANAEROBIC 5CC   Final   Culture  Setup Time     Final   Value: 04/27/2014 16:26     Performed at [facility]   Culture     Final   Value:        BLOOD CULTURE RECEIVED NO GROWTH TO DATE CULTURE WILL BE HELD FOR 5 DAYS BEFORE ISSUING A FINAL NEGATIVE REPORT     Performed at [facility]   Report Status PENDING   Incomplete  CULTURE, BLOOD (ROUTINE X 2)     Status: None   Collection Time    04/27/14 11:29 AM      Result Value Ref  Range Status   Specimen Description BLOOD RIGHT HAND   Final   Special Requests     Final   Value: BOTTLES DRAWN AEROBIC AND ANAEROBIC 10CC AER 2CC ANA   Culture  Setup Time     Final   Value: 04/27/2014 16:26     Performed at [facility]   Culture     Final   Value:        BLOOD CULTURE RECEIVED NO GROWTH TO DATE CULTURE WILL BE HELD FOR 5 DAYS BEFORE ISSUING A FINAL NEGATIVE REPORT     Performed at [facility]   Report Status PENDING   Incomplete  URINE CULTURE     Status: None   Collection Time    04/28/14 10:10 AM      Result Value Ref Range Status   Specimen Description URINE, RANDOM   Final   Special Requests NONE   Final   Culture  Setup Time     Final   Value: 04/28/2014 12:46     Performed at [place]     Final   Value: >=100,000 COLONIES/ML     Performed at [facility]   Culture     Final   Value: PSEUDOMONAS AERUGINOSA     Performed at [facility]   Report Status PENDING   Incomplete  CSF CULTURE     Status: None   Collection Time    04/28/14 10:10 AM      Result Value Ref Range Status   Specimen  Description CSF   Final   Special Requests 1.0ML CSF FLUID   Final   Gram Stain     Final   Value: MODERATE WBC PRESENT,BOTH PMN AND MONONUCLEAR     NO ORGANISMS SEEN     Performed at [facility]     Performed at [facility]   Culture     Final   Value: NO GROWTH 1 DAY     Performed at [facility]   Report Status PENDING   Incomplete  GRAM STAIN     Status: None   Collection Time    04/28/14 10:10 AM      Result Value Ref Range Status   Specimen Description CSF   Final   Special Requests 1.0ML CSF FLUID   Final   Gram Stain     Final   Value: DIRECT SMEAR     MODERATE WBC PRESENT,BOTH PMN AND MONONUCLEAR     NO ORGANISMS SEEN   Report Status 04/28/2014 FINAL   Final    Anti-infectives   Start     Dose/Rate Route Frequency Ordered Stop   04/27/14 2200  vancomycin (VANCOCIN) IVPB 1000 mg/200 mL premix     1,000 mg 200 mL/hr over 60 Minutes Intravenous Every 12 hours 04/27/14 1006     04/27/14 1030  cefTAZidime (FORTAZ) 1 g in dextrose 5 % 50 mL IVPB     1 g 100 mL/hr over 30 Minutes Intravenous Every 8 hours 04/27/14 1015     04/27/14 1015  piperacillin-tazobactam (ZOSYN) IVPB 3.375 g  Status:  Discontinued     3.375 g 12.5 mL/hr over 240 Minutes Intravenous Every 8 hours 04/27/14 1004 04/27/14 1014   04/27/14 1015  vancomycin (VANCOCIN) 1,750 mg in sodium chloride 0.9 % 500 mL IVPB     1,750 mg 250 mL/hr over 120 Minutes Intravenous  Once 04/27/14 1006 04/27/14 1426   04/23/14  2200  ceFAZolin (ANCEF) IVPB 2 g/50 mL premix  Status:  Discontinued     2 g 100 mL/hr over 30 Minutes Intravenous 3 times per day 04/23/14 1750 04/27/14 0951   04/23/14 1638  bacitracin 50,000 Units in sodium chloride irrigation 0.9 % 500 mL irrigation  Status:  Discontinued       As needed 04/23/14 1639 04/23/14 1733   04/20/14 0200  ceFAZolin (ANCEF) IVPB 1 g/50 mL premix     1 g 100 mL/hr over 30 Minutes Intravenous Every 8 hours 04/19/14 2104 04/20/14 1011   04/19/14  1831  bacitracin 50,000 Units in sodium chloride irrigation 0.9 % 500 mL irrigation  Status:  Discontinued       As needed 04/19/14 1835 04/19/14 1917      Assessment: 67 YOM on day # 3 (total antibiotic day #7) of vancomycin and Fortaz for r/o sepsis in setting of new fever. Fever curve is trending down (Tm 99.2). WBC is up on decardon. SCr is stable.   Vancomycin trough today is therapeutic at 17.3.   5/27 CSF >> 5/26 Blood x2 >>  5/23 BAL- NPF 5/18 MRSA pcr negative.   Goal of Therapy:  Vancomycin trough level 15-20 mcg/ml  Plan:  1. Continue vancomycin 1g IV q12h 2. Continue Fortaz 1g IV q8h. 3. Monitor renal function, cultures, and clinical status.  4. Consider weekly vancomycin troughs if continue on therapy long term.   Sloan Leiter, PharmD, BCPS Clinical Pharmacist [phone] 04/29/2014,11:44 AM

## 2014-04-29 NOTE — Progress Notes (Signed)
NUTRITION FOLLOW-UP  DOCUMENTATION CODES Per approved criteria  -Obesity Unspecified   INTERVENTION: Ensure Complete po BID, each supplement provides 350 kcal and 13 grams of protein  NUTRITION DIAGNOSIS: Inadequate oral intake now related to dysphagia and confusion as evidenced by Meal Completion: <25%; ongoing  Goal: Pt to meet >/= 90% of their estimated nutrition needs, not met.   Monitor:  Respiratory status, TF initiation and tolerance, weight trend, labs  ASSESSMENT: 68 yo with AF on Coumadin admitted after fall. Head CT revealed SDH with midline shift, and hemorrhagic contusion of the right temporal lobe. Underwent craniotomy and hematoma evacuation. Per MD notes, pt with hx of ETOH abuse.   Pt had reexploration of right pterional craniotomy for partial temporal lobectomy and evacuation of right temporal hematoma with implantation of the bone flap in the right lateral abdominal wall cavity 5/22.   Pt discussed during ICU rounds and with RN. Electrolytes normalized. Pt extubated 5/27 and started on dysphagia diet. Pt is confused and in restraints. Breakfast at bedside, pt had only drank OJ.   Height: Ht Readings from Last 1 Encounters:  04/19/14 5' 8"  (1.727 m)    Weight: Wt Readings from Last 1 Encounters:  04/29/14 230 lb 2.6 oz (104.4 kg)  Admission weight 237 lb (107.6 kg) 5/22   BMI:  Body mass index is 35 kg/(m^2).  Estimated Nutritional Needs: Kcal: 1950-2100  Protein: 95-110 grams  Fluid: > 2 L/day  Skin: right head incision, abdominal incision  Diet Order: Dysphagia 1 with Nectar Thickened Liquids   Intake/Output Summary (Last 24 hours) at 04/29/14 1101 Last data filed at 04/29/14 1003  Gross per 24 hour  Intake    610 ml  Output   3665 ml  Net  -3055 ml    Last BM: 5/27  Labs:   Recent Labs Lab 04/24/14 0400 04/25/14 0430 04/26/14 0500 04/27/14 0500 04/27/14 0545 04/28/14 0500 04/29/14 0308  NA 146 147 145  --  146 147 141  K 3.6*  3.7 3.8  --  3.9 4.0 4.0  CL 111 113* 112  --  110 110 102  CO2 21 25 24   --  26 26 28   BUN 23 20 18   --  20 27* 26*  CREATININE 1.10 1.03 0.92  --  1.02 1.13 0.97  CALCIUM 8.3* 8.4 8.5  --  8.6 8.4 9.1  MG 2.4 2.5 2.4  --   --   --   --   PHOS 2.5 2.0* 1.9* 2.7  --   --   --   GLUCOSE 101* 142* 180*  --  132* 115* 148*    CBG (last 3)   Recent Labs  04/28/14 2349 04/29/14 0336 04/29/14 0752  GLUCAP 173* 140* 150*    Scheduled Meds: . antiseptic oral rinse  15 mL Mouth Rinse q12n4p  . atorvastatin  20 mg Per Tube q1800  . carvedilol  25 mg Per Tube BID WC  . cefTAZidime (FORTAZ)  IV  1 g Intravenous Q8H  . chlorhexidine  15 mL Mouth Rinse BID  . dexamethasone  4 mg Intravenous 4 times per day  . diltiazem  60 mg Oral 3 times per day  . feeding supplement (PRO-STAT SUGAR FREE 64)  60 mL Per Tube TID  . feeding supplement (VITAL AF 1.2 CAL)  1,000 mL Per Tube Q24H  . folic acid  1 mg Oral Daily  . furosemide  20 mg Intravenous Daily  . insulin aspart  0-15 Units  Subcutaneous 6 times per day  . ipratropium-albuterol  3 mL Nebulization QID  . levETIRAcetam  500 mg Oral BID  . multivitamin with minerals  1 tablet Oral Daily  . pantoprazole  40 mg Oral QHS  . thiamine  100 mg Oral Daily  . vancomycin  1,000 mg Intravenous Q12H    Continuous Infusions: . sodium chloride 10 mL/hr at 04/29/14 0900  . fentaNYL infusion INTRAVENOUS Stopped (04/26/14 1520)  . propofol Stopped (04/28/14 0800)    Past Medical History  Diagnosis Date  . Systolic heart failure   . 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 Dyslipidemia   . HTN (hypertension)   . Atrial fibrillation   . Cancer     around nose  . Tobacco abuse   . Alcohol abuse   . H/O hiatal hernia     Past Surgical History  Procedure Laterality Date  . Coronary artery bypass graft  12/24/2008    4 vessel  . Umbilical hernia repair  2010  . Hernia repair  2012  . Coronary artery bypass graft  2012   . Ventral hernia repair N/A 01/22/2013    Procedure: HERNIA REPAIR VENTRAL ADULT;  Surgeon: [name], MD;  Location: [place];  Service: General;  Laterality: N/A;  . Application of a-cell of chest/abdomen N/A 01/22/2013    Procedure: APPLICATION OF A-CELL OF CHEST/ABDOMEN;  Surgeon: [name], MD;  Location: [place];  Service: General;  Laterality: N/A;  . Cystoscopy N/A 01/22/2013    Procedure: Consuela Mimes;  Surgeon: [name], MD;  Location: [place];  Service: Urology;  Laterality: N/A;  Cystoscopy with balloon dilation. Insertion of coude catheter.  . Craniotomy Right 04/19/2014    Procedure: CRANIOTOMY HEMATOMA EVACUATION SUBDURAL;  Surgeon: [name], MD;  Location: [place] [facility];  Service: Neurosurgery;  Laterality: Right;  right  . Craniotomy Right 04/23/2014    Procedure: Craniotomy for Intracerebral Hemorrhage;  Surgeon: [name], MD;  Location: [place] [facility];  Service: Neurosurgery;  Laterality: Right;    [place], [place], [place] Pager [phone] After Hours Pager

## 2014-04-29 NOTE — Progress Notes (Signed)
Speech Language Pathology Treatment: Dysphagia  Patient Details Name: Alejandro [name] MRN: [id number] DOB: 1946-04-04 Today's Date: 04/29/2014 Time: 3838-1840 SLP Time Calculation (min): 19 min  Assessment / Plan / Recommendation Clinical Impression  Pt seen durign lunch meal consisting of Dys 1 textures and nectar thick liquids via cup sips. Pt with decreased sustained attention requiring frequent redirection, although only Min cues were provided for oral holding. Recommend cognitive-linguistic evaluation to more fully assess current cognition, which appears to be contributing to swallowing function.   HPI HPI: 68 yo with AF on Coumadin admitted after fall. Head CT revealed SDH with midline shift, and hemorrhagic contusion of the right temporal lobe. Underwent craniotomy and hematoma evacuation 5/18.  5/22 OR>> Reexploration of right pterional craniotomy for partial temporal lobectomy and evacuation of right temporal hematoma with implantation of the bone flap in the right lateral abdominal wall cavity, remained intubated post op from 5/22-5/27.  Pt pulled NG tube; clinical swallow eval ordered.     Pertinent Vitals N/A  SLP Plan  Continue with current plan of care    Recommendations Diet recommendations: Dysphagia 3 (mechanical soft) Liquids provided via: Cup Medication Administration: Whole meds with puree Supervision: Full supervision/cueing for compensatory strategies (assist wtih feeding prn due to restraints needed for safety) Compensations: Slow rate;Clear throat intermittently;Small sips/bites Postural Changes and/or Swallow Maneuvers: Seated upright 90 degrees              General recommendations: Rehab consult Oral Care Recommendations: Oral care BID Follow up Recommendations: Inpatient Rehab;24 hour supervision/assistance Plan: Continue with current plan of care    GO      Germain Osgood, M.A. CCC-SLP [phone]  Germain Osgood 04/29/2014, 2:00 PM

## 2014-04-29 NOTE — Progress Notes (Signed)
Around [phone] nurse noticed patient picking on the incision. Found one staple on bed and one loosen. Also noticed clear fluid all over the bed, evidently leaking from incision on head. Patient is very confused but no new neurologic changes. Vitals remained stable. Applied new turban dressing. Dr. Joya Salm notified , received B/L soft wrist restraints order. No other orders at this time. Will continue to monitor closely

## 2014-04-30 LAB — BASIC METABOLIC PANEL
BUN: 36 mg/dL — ABNORMAL HIGH (ref 6–23)
CO2: 26 mEq/L (ref 19–32)
Calcium: 9.5 mg/dL (ref 8.4–10.5)
Chloride: 103 mEq/L (ref 96–112)
Creatinine, Ser: 0.95 mg/dL (ref 0.50–1.35)
GFR calc Af Amer: >90 mL/min (ref >90)
GFR, EST NON AFRICAN AMERICAN: 84 mL/min — AB (ref >90)
Glucose, Bld: 158 mg/dL — ABNORMAL HIGH (ref 70–99)
Potassium: 3.8 mEq/L (ref 3.7–5.3)
SODIUM: 143 meq/L (ref 137–147)

## 2014-04-30 LAB — URINE CULTURE

## 2014-04-30 LAB — GLUCOSE, CAPILLARY
GLUCOSE-CAPILLARY: 140 mg/dL — AB (ref 70–99)
GLUCOSE-CAPILLARY: 152 mg/dL — AB (ref 70–99)
GLUCOSE-CAPILLARY: 223 mg/dL — AB (ref 70–99)
GLUCOSE-CAPILLARY: 168 mg/dL — AB (ref 70–99)
GLUCOSE-CAPILLARY: 183 mg/dL — AB (ref 70–99)
Glucose-Capillary: 160 mg/dL — ABNORMAL HIGH (ref 70–99)

## 2014-04-30 LAB — MAGNESIUM: MAGNESIUM: 2.5 mg/dL (ref 1.5–2.5)

## 2014-04-30 LAB — PHOSPHORUS: PHOSPHORUS: 3.5 mg/dL (ref 2.3–4.6)

## 2014-04-30 MED ORDER — DILTIAZEM HCL 60 MG PO TABS
60.0000 mg | ORAL_TABLET | Freq: Four times a day (QID) | ORAL | Status: DC
  Administered 2014-04-30 – 2014-05-04 (×16): 60 mg via ORAL
  Filled 2014-04-30 (×22): qty 1

## 2014-04-30 MED ORDER — LEVOFLOXACIN IN D5W 750 MG/150ML IV SOLN
750.0000 mg | INTRAVENOUS | Status: DC
  Administered 2014-04-30 – 2014-05-01 (×2): 750 mg via INTRAVENOUS
  Filled 2014-04-30 (×2): qty 150

## 2014-04-30 NOTE — Progress Notes (Addendum)
PULMONARY / CRITICAL CARE MEDICINE   Name: Alejandro [name] MRN: [id number] DOB: 24-Mar-1946    ADMISSION DATE:  04/19/2014 CONSULTATION DATE:  04/19/2014  REFERRING MD :  EDP PRIMARY SERVICE: PCCM  CHIEF COMPLAINT:  VDRF post-op crani for SDH  BRIEF PATIENT DESCRIPTION: 68 yo with AF on Coumadin admitted after fall.  Head CT revealed SDH with midline shift, and hemorrhagic contusion of the right temporal lobe. Underwent craniotomy and hematoma evacuation.  SIGNIFICANT EVENTS / STUDIES:  5/18  Head CT >>> SDH with midline shift, and hemorrhagic contusion of the right temporal lobe 5/18  OR >>> Craniotomy, hematoma evacuation 5/22 CT head>>> evolution of intraparenchymal hemorrhagic contusion or possibly venous infarction in the right temporal lobe with significant right temporal lobe swelling 5/22  OR>> Reexploration of right pterional craniotomy for partial temporal lobectomy and evacuation of right temporal hematoma with implantation of the bone flap in the right lateral abdominal wall cavity, remained intubated post op  5/23 FOB>>>minimal secretions 5/26- improved atx  5/27- extubated 5/27- NS drained csf collection percutaneously  5/28- neuro progress  LINES / TUBES: ETT 5/22>>>5/27 Left radial 5/22>>>5/27  CULTURES: BAL 5/23>>>NF BC 5/26>>> 5/27 urine >>>pseudononas 5/26 sputum>>> 5/27 csf>>>  ANTIBIOTICS: Cefazolin 5/22>>>5/26 5/26 vanc>>>5/29 5/26 ceftaz>>>5/29 5/29 levofloxacin>>>  INTERVAL HISTORY:   Doing well, no fevers  VITAL SIGNS: Temp:  [97.2 F (36.2 C)-98.3 F (36.8 C)] 97.9 F (36.6 C) (05/29 0700) Pulse Rate:  [49-99] 77 (05/29 0900) Resp:  [15-26] 17 (05/29 0900) BP: (92-190)/(49-155) 149/61 mmHg (05/29 0900) SpO2:  [92 %-100 %] 100 % (05/29 0900) Weight:  [104 kg (229 lb 4.5 oz)] 104 kg (229 lb 4.5 oz) (05/29 0500)  HEMODYNAMICS:   VENTILATOR SETTINGS:   INTAKE / OUTPUT: Intake/Output     05/28 0701 - 05/29 0700 05/29 0701 - 05/30 0700   P.O.  175   I.V. (mL/kg) 210 (2) 20 (0.2)   NG/GT     IV Piggyback 550 50   Total Intake(mL/kg) 760 (7.3) 245 (2.4)   Urine (mL/kg/hr) 1965 (0.8)    Total Output 1965     Net -1205 +245        Stool Occurrence 1 x     PHYSICAL EXAMINATION: General: no distress Neuro: awake, confusion at times, oriented x2-3 HEENT: Surgical dressing intact, dry Cardiovascular:  s1 s2 Irregular, no murmurs Lungs:  Cta Abdomen:  Soft, bowel sounds present Musculoskeletal:  Trace edema Skin:  No rash  LABS:  CBC  Recent Labs Lab 04/27/14 0545 04/28/14 0500 04/29/14 0308  WBC 9.8 9.1 12.5*  HGB 9.4* 8.5* 10.3*  HCT 28.4* 25.5* 30.6*  PLT 199 204 243   Coag's No results found for this basename: APTT, INR,  in the last 168 hours BMET  Recent Labs Lab 04/28/14 0500 04/29/14 0308 04/30/14 0318  NA 147 141 143  K 4.0 4.0 3.8  CL 110 102 103  CO2 26 28 26   BUN 27* 26* 36*  CREATININE 1.13 0.97 0.95  GLUCOSE 115* 148* 158*   Electrolytes  Recent Labs Lab 04/25/14 0430 04/26/14 0500 04/27/14 0500  04/28/14 0500 04/29/14 0308 04/30/14 0318  CALCIUM 8.4 8.5  --   < > 8.4 9.1 9.5  MG 2.5 2.4  --   --   --   --  2.5  PHOS 2.0* 1.9* 2.7  --   --   --  3.5  < > = values in this interval not displayed. Sepsis Markers No results found for  this basename: LATICACIDVEN, PROCALCITON, O2SATVEN,  in the last 168 hours ABG  Recent Labs Lab 04/24/14 0446  PHART 7.402  PCO2ART 31.5*  PO2ART 91.0   Liver Enzymes  Recent Labs Lab 04/28/14 0500 04/29/14 0308  AST 45* 47*  ALT 30 36  ALKPHOS 72 84  BILITOT 0.5 0.8  ALBUMIN 2.2* 2.5*   Cardiac Enzymes No results found for this basename: TROPONINI, PROBNP,  in the last 168 hours Glucose  Recent Labs Lab 04/29/14 1155 04/29/14 1552 04/29/14 2017 04/29/14 2348 04/30/14 0328 04/30/14 0745  GLUCAP 231* 149* 180* 160* 140* 152*    IMAGING:  Dg Chest Port 1 View  04/29/2014   CLINICAL DATA:  Edema, followup  EXAM:  PORTABLE CHEST - 1 VIEW  COMPARISON:  Portable chest x-ray of 04/28/2014  FINDINGS: The endotracheal tube has been removed as has the feeding tube. Aeration has improved and there has been improvement in the degree of pulmonary vascular congestion. Cardiomegaly is stable. Median sternotomy sutures are noted.  IMPRESSION: Endotracheal tube removed. Improved aeration with improvement in pulmonary vascular congestion.   Electronically Signed   By: Ivar Drape M.D.   On: 04/29/2014 08:10   ASSESSMENT / PLAN:  PULMONARY A:  VDRF - in setting ICH with worsening mental status and repeat OR for evolving hemorrhage Atx/collapse L hemithorax -  resolved post bscopy 5/23- resolved Smoker -likely COPD P:   IS when able Albut/atrovent nebs  NEUROLOGIC A:   SDH s/p evacuation ICH Hx ETOH  Delirium, alcoholic - improving  csgf collection drained 5/27 P:   Neurosurgery recs noted Thiamine/Folate rehab  CARDIOVASCULAR A:  HTN AF, controlled rate Able to take PO P:  Labetalol coreg Statin  PO cardizem increased further frequency  Tele dc Goal normotension, need to lower diast slight   RENAL A:   Resolved hypophos hypervolemia P:   KVO IVF  Am chem Lasix consider dc  GASTROINTESTINAL A:  Nutrition Constipation improved P:   Diet  Colace dulc supp prn  HEMATOLOGIC A:   Coumadin induced coagulopathy, reversed VTE Px P:  SCD ambulation  INFECTIOUS A:   ATX left, fever reoccurrence (central?) - improved Csf collection, likely sterile UTI P:   ceftaz, vanc dc , add levofloxacin as would prefer more gram pos coverage than cipro for head wound Foley is out, would like to keep this out  ENDOCRINE A:   Hyperglycemia - no known hx DM P:   SSI   To floor, await rehab To triad  Lavon Paganini. Titus Mould, MD, [place] Pgr: [facility]

## 2014-04-30 NOTE — Evaluation (Signed)
Speech Language Pathology Evaluation Patient Details Name: Alejandro [name] MRN: [id number] DOB: 21-Mar-1946 Today's Date: 04/30/2014 Time: 4696-2952 SLP Time Calculation (min): 21 min  Problem List:  Patient Active Problem List   Diagnosis Date Noted  . Acute delirium 04/23/2014  . Alcohol withdrawal 04/23/2014  . Subdural hematoma 04/19/2014  . SDH (subdural hematoma) 04/19/2014  . Altered mental status 04/19/2014  . Accelerated hypertension 04/19/2014  . Encounter for therapeutic drug monitoring 02/09/2014  . Recurrent ventral hernia 10/22/2012  . Skin lesion 09/26/2012  . Hernia 09/26/2012  . SYSTOLIC HEART FAILURE, CHRONIC 06/15/2009  . SYSTOLIC HEART FAILURE, ACUTE ON CHRONIC 01/28/2009  . HYPERLIPIDEMIA-MIXED 01/27/2009  . ATRIAL FIBRILLATION 01/27/2009  . CAD, ARTERY BYPASS GRAFT 12/23/2008   Past Medical History:  Past Medical History  Diagnosis Date  . Systolic heart failure   . 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 Dyslipidemia   . HTN (hypertension)   . Atrial fibrillation   . Cancer     around nose  . Tobacco abuse   . Alcohol abuse   . H/O hiatal hernia    Past Surgical History:  Past Surgical History  Procedure Laterality Date  . Coronary artery bypass graft  12/24/2008    4 vessel  . Umbilical hernia repair  2010  . Hernia repair  2012  . Coronary artery bypass graft  2012  . Ventral hernia repair N/A 01/22/2013    Procedure: HERNIA REPAIR VENTRAL ADULT;  Surgeon: [name], MD;  Location: [facility];  Service: General;  Laterality: N/A;  . Application of a-cell of chest/abdomen N/A 01/22/2013    Procedure: APPLICATION OF A-CELL OF CHEST/ABDOMEN;  Surgeon: [name], MD;  Location: [facility];  Service: General;  Laterality: N/A;  . Cystoscopy N/A 01/22/2013    Procedure: Consuela Mimes;  Surgeon: [name], MD;  Location: [facility];  Service: Urology;  Laterality: N/A;  Cystoscopy with balloon dilation. Insertion of coude catheter.   . Craniotomy Right 04/19/2014    Procedure: CRANIOTOMY HEMATOMA EVACUATION SUBDURAL;  Surgeon: [name], MD;  Location: [facility];  Service: Neurosurgery;  Laterality: Right;  right  . Craniotomy Right 04/23/2014    Procedure: Craniotomy for Intracerebral Hemorrhage;  Surgeon: [name], MD;  Location: [facility];  Service: Neurosurgery;  Laterality: Right;   HPI:  68 yo with AF on Coumadin admitted after fall. Head CT revealed SDH with midline shift, and hemorrhagic contusion of the right temporal lobe. Underwent craniotomy and hematoma evacuation 5/18.  5/22 OR>> Reexploration of right pterional craniotomy for partial temporal lobectomy and evacuation of right temporal hematoma with implantation of the bone flap in the right lateral abdominal wall cavity, remained intubated post op from 5/22-5/27.  Pt pulled NG tube; clinical swallow eval ordered.     Assessment / Plan / Recommendation Clinical Impression  Pt presents with moderate cognitive-linguistic deficits characterized by difficulty sustaining attention to tasks, poor topic maintenance with circumlocutionary expression, difficulty following two-step commands.  Pt with impaired insight into deficits; moderate-severe short-term memory deficits.  Recommend SLP f/u to address cognition acutely and in next venue of care.  WIfe present for eval; agrees with plan.     SLP Assessment  Patient needs continued Speech Lanaguage Pathology Services    Follow Up Recommendations  Inpatient Rehab    Frequency and Duration min 2x/week  2 weeks      SLP Goals  SLP Goals  Potential to Achieve Goals: Good Progress/Goals/Alternative treatment plan discussed with pt/caregiver and they: Agree  SLP Evaluation Prior Functioning  Cognitive/Linguistic Baseline: Within functional limits (wife describes as very intelligent; recalls details from boo)  Lives With: Spouse Vocation: Retired   Associate Professor  Overall Cognitive Status: Impaired/Different  from baseline Arousal/Alertness: Awake/alert Orientation Level: Oriented to person;Oriented to place;Oriented to situation;Disoriented to time Attention: Sustained Sustained Attention: Impaired Sustained Attention Impairment: Verbal basic;Functional basic Memory: Impaired Memory Impairment: Storage deficit;Retrieval deficit;Decreased short term memory Decreased Short Term Memory: Verbal basic;Functional basic Awareness: Impaired Awareness Impairment: Intellectual impairment Problem Solving: Impaired Executive Function: Reasoning;Organizing;Decision Making Reasoning: Impaired Reasoning Impairment: Verbal basic Organizing: Impaired Organizing Impairment: Verbal basic Decision Making: Impaired Decision Making Impairment: Verbal basic Safety/Judgment: Impaired    Comprehension  Auditory Comprehension Overall Auditory Comprehension: Impaired Yes/No Questions: Within Functional Limits Commands: Impaired Two Step Basic Commands: 50-74% accurate Conversation: Simple Visual Recognition/Discrimination Discrimination: Within Function Limits Reading Comprehension Reading Status: Within funtional limits    Expression Expression Primary Mode of Expression: Verbal Verbal Expression Overall Verbal Expression: Appears within functional limits for tasks assessed Level of Generative/Spontaneous Verbalization: Conversation Pragmatics: Impairment Written Expression Dominant Hand: Right Written Expression: Not tested   Oral / Motor Oral Motor/Sensory Function Overall Oral Motor/Sensory Function: Appears within functional limits for tasks assessed Motor Speech Overall Motor Speech: Appears within functional limits for tasks assessed        Assunta Curtis 04/30/2014, 5:04 PM

## 2014-04-30 NOTE — Progress Notes (Signed)
Rehab admissions - I met with pt and explained the possibility of inpatient rehab. Initial questions were answered and informational brochures were given. Pt answered questions pretty clearly and did have bilateral mitt restraints in place.  I then called pt's wife and gave further information about our rehab program. Questions were answered and she confirmed that she can provide 24-7 assistance with pt. She does help to care for her grand-daughter but plans for grand-daughter to be in day care over the summer.  We will follow pt's case and they are interested in pursuing CIR. I will now open the case with his [place]. I will keep the pt/family and medical team aware of any updates. Thanks.   Nanetta Batty, PT Rehabilitation Admissions Coordinator [phone]'

## 2014-04-30 NOTE — Progress Notes (Addendum)
Pt previously I&O cath 2x, last at midnight. Bladder scan at 11:05 >999, foley placed per protocol under sterile technique. Site cleansed with soap & water prior to insertion. Returned yellow urine. Pt already positive for UTI per MD prior to this foley placement. MD aware of need for foley.   Latrelle Dodrill

## 2014-04-30 NOTE — Progress Notes (Signed)
OT evaluation - re eval  PTA, pt independent with ADL and mobility. Pt presents with significant cognitive deficits, decreased postural control and general weakness. Pt also demonstrates mild L inattention. Deficits decreasing independence with ADL and mobilty. Pt will benefit from CIR to return to PLOF. Will follow acutely.  04/30/14 1300  OT Visit Information  Last OT Received On 04/30/14  Assistance Needed +2  History of Present Illness History of present illness: Patient is a 68 year old gentleman who underwent craniotomy for evacuation of a right acute subdural hematoma on the night patient initially did very well however then had to be sedated for presumptive alcohol withdrawal symptoms. Initial followup CT scan showed excellent evacuation of the subdural hematoma in stable small right temporal contusion followup CT scan 40 hours later showed expansion of his right temporal intracerebral hematoma and possible venous infarction with temporal lobe swelling and possible impending uncal herniation. Extubated 5/27.  Precautions  Precautions Fall  Restrictions  Weight Bearing Restrictions No  Home Living  Family/patient expects to be discharged to: Inpatient rehab  Living Arrangements Spouse/significant other  Prior Function  Level of Independence Independent  Comments Pt worked as an Pharmacist, hospital doing Equities trader No difficulties  Cognition  Arousal/Alertness Awake/alert  Behavior During Therapy Flat affect  Overall Cognitive Status Impaired/Different from baseline  Area of Impairment Orientation;Attention;Memory;Following commands;Safety/judgement;Awareness;Problem solving  Orientation Level Disoriented to;Time  Current Attention Level Sustained  Memory Decreased recall of precautions;Decreased short-term memory  Following Commands Follows one step commands consistently  Safety/Judgement Decreased awareness of safety;Decreased  awareness of deficits  Awareness Emergent  Problem Solving Slow processing;Decreased initiation;Difficulty sequencing;Requires verbal cues;Requires tactile cues  General Comments Distracted dueint eval by tele wires. Impulsive. confabulating  Upper Extremity Assessment  Upper Extremity Assessment Generalized weakness  Lower Extremity Assessment  Lower Extremity Assessment Generalized weakness  Cervical / Trunk Assessment  Cervical / Trunk Assessment Normal  ADL  Overall ADL's  Needs assistance/impaired  Eating/Feeding Set up;Supervision/ safety;Sitting  Grooming Set up;Supervision/safety;Cueing for safety;Cueing for sequencing  Grooming Details (indicate cue type and reason) Applied deoderant to his entire upper body  Upper Body Bathing Minimal assitance  Lower Body Bathing Moderate assistance;Sit to/from stand  Upper Body Dressing  Moderate assistance;Sitting  Lower Body Dressing Moderate assistance;Sit to/from Retail buyer Moderate assistance;Stand-pivot;BSC  Toileting - Clothing Manipulation Details (indicate cue type and reason) foley  Functional mobility during ADLs Moderate assistance  General ADL Comments Functional mobility and ADL affected by general weakness and cognitive deficits.  Vision- History  Baseline Vision/History Wears glasses  Wears Glasses Reading only  Patient Visual Report No change from baseline  Vision- Assessment  Vision Assessment? Yes  Saccades Additional head turns occurred during testing;Decreased speed of saccadic movement  Additional Comments Increased difficulty with L visual attention  Perception  Comments will further assess  Praxis  Praxis tested? Deficits  Deficits Perseveration;Organization  Bed Mobility  Overal bed mobility Needs Assistance  Transfers  Overall transfer level Needs assistance  Transfers Sit to/from Stand;Stand Pivot Transfers  Sit to Stand Mod assist  Stand pivot transfers Mod assist  Balance  Overall  balance assessment Needs assistance  Sitting-balance support Feet supported;No upper extremity supported  Sitting balance-Leahy Scale Fair  Standing balance support During functional activity;Bilateral upper extremity supported  Standing balance-Leahy Scale Poor  OT - End of Session  Equipment Utilized During Treatment Gait belt  Activity Tolerance Patient tolerated treatment well  Patient left Other (comment) (in w/c with nsg)  Nurse Communication Mobility status  OT Assessment  OT Recommendation/Assessment Patient needs continued OT Services  OT Problem List Decreased strength;Decreased activity tolerance;Impaired balance (sitting and/or standing);Impaired vision/perception;Decreased coordination;Decreased cognition;Decreased safety awareness;Decreased knowledge of use of DME or AE;Pain  Barriers to Discharge Other (comment) (unsure of caregiver support)  OT Therapy Diagnosis  Generalized weakness;Cognitive deficits;Disturbance of vision  OT Plan  OT Frequency Min 3X/week  OT Treatment/Interventions Self-care/ADL training;Therapeutic exercise;Neuromuscular education;Energy conservation;DME and/or AE instruction;Therapeutic activities;Cognitive remediation/compensation;Visual/perceptual remediation/compensation;Patient/family education;Balance training  OT Recommendation  Recommendations for Other Services Rehab consult  Follow Up Recommendations CIR;Supervision/Assistance - 24 hour  OT Equipment 3 in 1 bedside comode;Tub/shower bench  Individuals Consulted  Consulted and Agree with Results and Recommendations Patient  Acute Rehab OT Goals  Patient Stated Goal to get better  OT Goal Formulation With patient  Time For Goal Achievement 05/14/14  Potential to Achieve Goals Good  OT Time Calculation  OT Start Time 1404  OT Stop Time 1440  OT Time Calculation (min) 36 min  OT General Charges  $OT Visit 1 Procedure  OT Evaluation  $OT Re-eval 1 Procedure  OT Treatments  $Self  Care/Home Management  23-37 mins  Written Expression  Dominant Hand Right  Maurie Boettcher, OTR/L  [phone] 04/30/2014

## 2014-04-30 NOTE — Progress Notes (Signed)
Subjective: Patient reports Overall today no headache no drainage  Objective: Vital signs in last 24 hours: Temp:  [97.2 F (36.2 C)-98.3 F (36.8 C)] 97.9 F (36.6 C) (05/29 0700) Pulse Rate:  [49-99] 86 (05/29 0700) Resp:  [15-26] 20 (05/29 0700) BP: (92-190)/(49-155) 140/124 mmHg (05/29 0700) SpO2:  [92 %-100 %] 100 % (05/29 0755) Weight:  [104 kg (229 lb 4.5 oz)] 104 kg (229 lb 4.5 oz) (05/29 0500)  Intake/Output from previous day: 05/28 0701 - 05/29 0700 In: 760 [I.V.:210; IV Piggyback:550] Out: 1965 [Urine:1965] Intake/Output this shift:    Neurologically stable awake alert follows commands incision dry lap soft  Lab Results:  Recent Labs  04/28/14 0500 04/29/14 0308  WBC 9.1 12.5*  HGB 8.5* 10.3*  HCT 25.5* 30.6*  PLT 204 243   BMET  Recent Labs  04/29/14 0308 04/30/14 0318  NA 141 143  K 4.0 3.8  CL 102 103  CO2 28 26  GLUCOSE 148* 158*  BUN 26* 36*  CREATININE 0.97 0.95  CALCIUM 9.1 9.5    Studies/Results: Dg Chest Port 1 View  04/29/2014   CLINICAL DATA:  Edema, followup  EXAM: PORTABLE CHEST - 1 VIEW  COMPARISON:  Portable chest x-ray of 04/28/2014  FINDINGS: The endotracheal tube has been removed as has the feeding tube. Aeration has improved and there has been improvement in the degree of pulmonary vascular congestion. Cardiomegaly is stable. Median sternotomy sutures are noted.  IMPRESSION: Endotracheal tube removed. Improved aeration with improvement in pulmonary vascular congestion.   Electronically Signed   By: Ivar Drape M.D.   On: 04/29/2014 08:10    Assessment/Plan: Okay for transfer to rehabilitation should a bed be available  LOS: 11 days     [name] 04/30/2014, 8:46 AM

## 2014-04-30 NOTE — Progress Notes (Signed)
UR completed. Planned for CIR.  Sandi Mariscal, RN BSN [place] [facility] CCM Trauma/Neuro ICU Case Manager [phone]

## 2014-04-30 NOTE — Progress Notes (Signed)
Physical Therapy Treatment Patient Details Name: Alejandro [name] MRN: [id number] DOB: [birth year] Today's Date: 04/30/2014    History of Present Illness 68 yo with AF on Coumadin admitted after fall.  Head CT revealed SDH with midline shift, and hemorrhagic contusion of the right temporal lobe. Underwent craniotomy and hematoma evacuation on 5/18.    PT Comments    Patient less confused this am, A&Ox3.  Patient able to initiate functional mobility with less delays today. Patient able to take pivotal steps with use of RWand assist. Will continue to progress activity as tolerated.  Follow Up Recommendations  CIR     Equipment Recommendations  None recommended by PT    Recommendations for Other Services Rehab consult     Precautions / Restrictions Precautions Precautions: Fall Restrictions Weight Bearing Restrictions: No    Mobility  Bed Mobility Overal bed mobility: Needs Assistance Bed Mobility: Supine to Sit     Supine to sit: Mod assist     General bed mobility comments: Vcs for hand placement, assist to elevate to EOB, initial assist for balance  Transfers Overall transfer level: Needs assistance Equipment used: Rolling walker (2 wheeled);2 person hand held assist Transfers: Sit to/from [facility] Sit to Stand: Mod assist;+2 physical assistance Stand pivot transfers: Mod assist       General transfer comment: Heavy reliance on RW, assist to come to standing position, VCs for hand placement and safety with use of RW  Ambulation/Gait Ambulation/Gait assistance: Mod assist Ambulation Distance (Feet): 6 Feet Assistive device: Rolling walker (2 wheeled)       General Gait Details: pivotal steps to chair   Stairs            Wheelchair Mobility    Modified Rankin (Stroke Patients Only)       Balance Overall balance assessment: Needs assistance Sitting-balance support: Feet supported Sitting balance-Leahy Scale: Fair Sitting balance  - Comments: pt impulsively moving about while in sitting, could not maintain sitting posture without assist (minimal) and at times maximal assist for safety   Standing balance support: Bilateral upper extremity supported Standing balance-Leahy Scale: Poor                      Cognition Arousal/Alertness: Awake/alert Behavior During Therapy: [facility] for tasks assessed/performed;Flat affect;Impulsive Overall Cognitive Status: Impaired/Different from baseline       Memory: Decreased short-term memory   Safety/Judgement: Decreased awareness of safety Awareness: Intellectual Problem Solving: Slow processing;Requires verbal cues;Requires tactile cues General Comments: patient more appropriate this am, A&Ox3, able to explain to me why he is here, that he has a piece of his skull in his belly and that he can not touch his head. 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 Apears remorseful.    Exercises      General Comments        Pertinent Vitals/Pain No pain reported this day    Home Living Family/patient expects to be discharged to:: Inpatient rehab Living Arrangements: Spouse/significant other             Additional Comments: pt was indep and worked full time    Prior Function            PT Goals (current goals can now be found in the care plan section) Acute Rehab PT Goals Patient Stated Goal: to get better PT Goal Formulation: With patient Time For Goal Achievement: 05/18/14 Potential to Achieve Goals:  Good Progress towards PT goals: Progressing toward goals    Frequency  Min 4X/week    PT Plan Current plan remains appropriate    Co-evaluation             End of Session Equipment Utilized During Treatment: Gait belt Activity Tolerance: Patient limited by fatigue Patient left: in bed;in chair;with call bell/phone within reach;with chair alarm set;with restraints reapplied     Time: 2957-4734 PT Time Calculation  (min): 19 min  Charges:  $Therapeutic Activity: 8-22 mins                    G Codes:      Duncan Dull 05/13/2014, 12:02 PM Alben Deeds, [place] DPT  [phone]

## 2014-05-01 LAB — CSF CULTURE: CULTURE: NO GROWTH

## 2014-05-01 LAB — GLUCOSE, CAPILLARY
GLUCOSE-CAPILLARY: 125 mg/dL — AB (ref 70–99)
Glucose-Capillary: 133 mg/dL — ABNORMAL HIGH (ref 70–99)
Glucose-Capillary: 161 mg/dL — ABNORMAL HIGH (ref 70–99)
Glucose-Capillary: 164 mg/dL — ABNORMAL HIGH (ref 70–99)
Glucose-Capillary: 190 mg/dL — ABNORMAL HIGH (ref 70–99)
Glucose-Capillary: 212 mg/dL — ABNORMAL HIGH (ref 70–99)

## 2014-05-01 LAB — BASIC METABOLIC PANEL
BUN: 38 mg/dL — ABNORMAL HIGH (ref 6–23)
CALCIUM: 9.1 mg/dL (ref 8.4–10.5)
CO2: 25 mEq/L (ref 19–32)
CREATININE: 0.95 mg/dL (ref 0.50–1.35)
Chloride: 104 mEq/L (ref 96–112)
GFR, EST NON AFRICAN AMERICAN: 84 mL/min — AB (ref >90)
Glucose, Bld: 147 mg/dL — ABNORMAL HIGH (ref 70–99)
Potassium: 4 mEq/L (ref 3.7–5.3)
SODIUM: 140 meq/L (ref 137–147)

## 2014-05-01 LAB — CSF CULTURE W GRAM STAIN

## 2014-05-01 MED ORDER — IPRATROPIUM-ALBUTEROL 0.5-2.5 (3) MG/3ML IN SOLN: 3.0000 mL | Freq: Four times a day (QID) | RESPIRATORY_TRACT | Status: DC | PRN

## 2014-05-01 MED ORDER — LEVOFLOXACIN 750 MG PO TABS
750.0000 mg | ORAL_TABLET | Freq: Every day | ORAL | Status: DC
  Administered 2014-05-02 – 2014-05-04 (×3): 750 mg via ORAL
  Filled 2014-05-01 (×3): qty 1

## 2014-05-01 NOTE — Progress Notes (Signed)
PHARMACIST - PHYSICIAN COMMUNICATION  CONCERNING: Antibiotic IV to Oral Route Change Policy  RECOMMENDATION: This patient is receiving Levaquin by the intravenous route.  Based on criteria approved by the Pharmacy and Therapeutics Committee, the antibiotic(s) is/are being converted to the equivalent oral dose form(s).   DESCRIPTION: These criteria include:  Patient being treated for a respiratory tract infection, urinary tract infection, cellulitis or clostridium difficile associated diarrhea if on metronidazole  The patient is not neutropenic and does not exhibit a GI malabsorption state  The patient is eating (either orally or via tube) and/or has been taking other orally administered medications for a least 24 hours  The patient is improving clinically and has a Tmax < 100.5  If you have questions about this conversion, please contact the Pharmacy Department  []   [phone] )  [facility] [x]   [phone] )  [facility]  []   [phone] )  [facility] []   [phone] )  [place], [place].D., BCPS Clinical Pharmacist Pager [phone] 05/01/2014 1:58 PM

## 2014-05-01 NOTE — Progress Notes (Signed)
PULMONARY / CRITICAL CARE MEDICINE   Name: Alejandro [name] MRN: [id number] DOB: [birth year]    ADMISSION DATE:  04/19/2014 CONSULTATION DATE:  04/19/2014  REFERRING MD :  EDP PRIMARY SERVICE: PCCM  CHIEF COMPLAINT:  VDRF post-op crani for SDH  BRIEF PATIENT DESCRIPTION: 68 yo with AF on Coumadin admitted after fall.  Head CT revealed SDH with midline shift, and hemorrhagic contusion of the right temporal lobe. Underwent craniotomy and hematoma evacuation.  SIGNIFICANT EVENTS / STUDIES:  5/18  Head CT >>> SDH with midline shift, and hemorrhagic contusion of the right temporal lobe 5/18  OR >>> Craniotomy, hematoma evacuation 5/22 CT head>>> evolution of intraparenchymal hemorrhagic contusion or possibly venous infarction in the right temporal lobe with significant right temporal lobe swelling 5/22  OR>> Reexploration of right pterional craniotomy for partial temporal lobectomy and evacuation of right temporal hematoma with implantation of the bone flap in the right lateral abdominal wall cavity, remained intubated post op  5/23 FOB>>>minimal secretions 5/26- improved atx  5/27- extubated 5/27- NS drained csf collection percutaneously     LINES / TUBES: ETT 5/22>>>5/27 Left radial 5/22>>>5/27  CULTURES: BAL 5/23>>>NF BC 5/26>>> 5/27 urine >>>pseudononas sensitive to cipro  5/27 csf> mod wbc's no org seen >>>  ANTIBIOTICS: Cefazolin 5/22>>>5/26 5/26 vanc>>>5/29 5/26 ceftaz>>>5/29 5/29 levofloxacin>>>  INTERVAL HISTORY:   NAD overnight  VITAL SIGNS: Temp:  [97.4 F (36.3 C)-98.6 F (37 C)] 98.4 F (36.9 C) (05/30 0422) Pulse Rate:  [72-96] 91 (05/30 0422) Resp:  [16-24] 20 (05/30 0422) BP: (98-150)/(50-92) 136/76 mmHg (05/30 0422) SpO2:  [93 %-100 %] 95 % (05/30 0719) Weight:  [230 lb 12.8 oz (104.69 kg)-231 lb 1.6 oz (104.826 kg)] 231 lb 1.6 oz (104.826 kg) (05/30 0500)  HEMODYNAMICS:   VENTILATOR SETTINGS:   INTAKE / OUTPUT: Intake/Output     05/29 0701 - 05/30  0700 05/30 0701 - 05/31 0700   P.O. 535 120   I.V. (mL/kg) 70 (0.7)    IV Piggyback 200    Total Intake(mL/kg) 805 (7.7) 120 (1.1)   Urine (mL/kg/hr) 2125 (0.8)    Total Output 2125     Net -1320 +120        Urine Occurrence 1 x    Stool Occurrence 1 x     PHYSICAL EXAMINATION: General: no distress Neuro: awake, confusion at times, oriented x2-3 HEENT: Surgical dressing intact, dry Cardiovascular:  s1 s2 Irregular, no murmurs Lungs:  Cta Abdomen:  Soft, bowel sounds present Musculoskeletal:  Trace edema Skin:  No rash  LABS:  CBC  Recent Labs Lab 04/27/14 0545 04/28/14 0500 04/29/14 0308  WBC 9.8 9.1 12.5*  HGB 9.4* 8.5* 10.3*  HCT 28.4* 25.5* 30.6*  PLT 199 204 243   Coag's No results found for this basename: APTT, INR,  in the last 168 hours BMET  Recent Labs Lab 04/29/14 0308 04/30/14 0318 05/01/14 0604  NA 141 143 140  K 4.0 3.8 4.0  CL 102 103 104  CO2 28 26 25   BUN 26* 36* 38*  CREATININE 0.97 0.95 0.95  GLUCOSE 148* 158* 147*   Electrolytes  Recent Labs Lab 04/25/14 0430 04/26/14 0500 04/27/14 0500  04/29/14 0308 04/30/14 0318 05/01/14 0604  CALCIUM 8.4 8.5  --   < > 9.1 9.5 9.1  MG 2.5 2.4  --   --   --  2.5  --   PHOS 2.0* 1.9* 2.7  --   --  3.5  --   < > =  values in this interval not displayed. Sepsis Markers No results found for this basename: LATICACIDVEN, PROCALCITON, O2SATVEN,  in the last 168 hours ABG No results found for this basename: PHART, PCO2ART, PO2ART,  in the last 168 hours Liver Enzymes  Recent Labs Lab 04/28/14 0500 04/29/14 0308  AST 45* 47*  ALT 30 36  ALKPHOS 72 84  BILITOT 0.5 0.8  ALBUMIN 2.2* 2.5*   Cardiac Enzymes No results found for this basename: TROPONINI, PROBNP,  in the last 168 hours Glucose  Recent Labs Lab 04/30/14 1145 04/30/14 1656 04/30/14 2105 05/01/14 0018 05/01/14 0421 05/01/14 0757  GLUCAP 223* 168* 183* 161* 125* 133*    IMAGING:  No results found. ASSESSMENT /  PLAN:  PULMONARY A:  VDRF - in setting ICH with worsening mental status and repeat OR for evolving hemorrhage Atx/collapse L hemithorax -  resolved post bscopy 5/23-  Smoker -likely COPD P:   IS   Albut/atrovent nebs Mobilize as tol   NEUROLOGIC A:   SDH s/p evacuation ICH Hx ETOH  Delirium, alcoholic - improving  csgf collection drained 5/27 P:   Neurosurgery recs noted Thiamine/Folate rehab   CARDIOVASCULAR A:  HTN AF, controlled rate Able to take PO P:   coreg Statin   cardizem  Tele dc   RENAL A:   Resolved hypophos hypervolemia P:   KVO IVF     GASTROINTESTINAL A:  Nutrition Constipation improved P:   Diet  Colace dulc supp prn  HEMATOLOGIC A:   Coumadin induced coagulopathy, reversed VTE Px P:  SCD ambulation  INFECTIOUS A:   ATX left, fever reoccurrence (central?) - improved Csf collection, likely sterile UTI P:   Per dashboard, levaquin should be adequate for pseudomonas in Urine   ENDOCRINE A:   Hyperglycemia - no known hx DM P:   SSI    To [facility]ad 5/31    Christinia Gully, MD [facility] [place] [phone] After 5:30 PM or weekends, call [phone]

## 2014-05-01 NOTE — Progress Notes (Signed)
Pt has an intermittent episodes of confusion, he has mittens on both hands, pt tried to pull out his foley cath. Pt was educated about the importance of foley in place. RN and NT noticed pt has some bleeding from urethral meatus. Pt is draining a minimal amount of blood clot. Paged MD [phone]. Awaiting for some orders to flushed pt foley cath.

## 2014-05-02 LAB — GLUCOSE, CAPILLARY
GLUCOSE-CAPILLARY: 141 mg/dL — AB (ref 70–99)
GLUCOSE-CAPILLARY: 157 mg/dL — AB (ref 70–99)
Glucose-Capillary: 131 mg/dL — ABNORMAL HIGH (ref 70–99)
Glucose-Capillary: 146 mg/dL — ABNORMAL HIGH (ref 70–99)
Glucose-Capillary: 193 mg/dL — ABNORMAL HIGH (ref 70–99)
Glucose-Capillary: 198 mg/dL — ABNORMAL HIGH (ref 70–99)

## 2014-05-02 NOTE — Progress Notes (Signed)
RN irrigated pt foley cath again per MD order, small amount of blood clots noted. Drained 111ml of bloody urine. We will continue to monitor.

## 2014-05-02 NOTE — Progress Notes (Signed)
RN observed pt has not have enough urine output -  less than 113ml for 6hrs. Bladder scan performed and showed 917ml of urine. RN called [place],MD cover and made aware. MD ordered irrigation and foley cath changed. 2RNs irrigated pt foley cath observing sterile technique, emptied 1459ml of bloody urine with blood clots noted. Pt tolerated well with the procedure, verbalized relief as well. We will continue to monitor.

## 2014-05-02 NOTE — Progress Notes (Addendum)
PATIENT DETAILS Name: Alejandro [name] Age: 68 y.o. Sex: male Date of Birth: Apr 12, 1946 Admit Date: 04/19/2014 Admitting Physician [name], MD [id number] Larose Kells, MD  Brief Description 68 year old male with history of a-fib on coumadin presented to the ED after a fall. In the ED a head CT was done that revealed SDH with midline shift, hemorrhagic contusion on the right temporal lobe and white matter disease. Neurosurgery was consulted, underwent craniotomy and hematoma evacuation on 5/18.Repeat CT head on 5/22 showed evolution of intraparenchymal hemorrhagic contusion or possibly venous infarction in the right temporal lobe with significant right temporal lobe swelling, subsequently underwent Reexploration of right pterional craniotomy for partial temporal lobectomy and evacuation of right temporal hematoma with implantation of the bone flap in the right lateral abdominal wall cavity. Patient remained extubated post op, and was subsequently extubated on 5/27.Transfered to [facility] on 5/31.  Subjective: No major complaints.  Assessment/Plan: Active Problems:   Subdural hematoma/ICH -traumatic, while on coumadin -Underwent Craniotomy, hematoma evacuation on 5/18. On 5/22 repeat CT head showedevolution of intraparenchymal hemorrhagic contusion or possibly venous infarction in the right temporal lobe with significant right temporal lobe swelling. ON 5/22 back to the OR for Reexploration of right pterional craniotomy for partial temporal lobectomy and evacuation of right temporal hematoma with implantation of the bone flap in the right lateral abdominal wall cavity. -Currently awake and alert, moves all 4 ext. PT has recommended CIR-await placement -On Keppra for seizure prophylaxis.Continues to be on Decadron, will need to touch base with Neurosurgery to see if this can now be tapered off.  Acute Ventilator Dependent Resp Failure -in setting ICH with worsening mental status and repeat OR for  evolving hemorrhage -extubated 5/27-doing well since then  Atx/collapse L hemithorax  -resolved post bscopy 5/23 -BAL 5/23 negative  Afib -stable -c/w Cardizem and Coreg -not a candidate for anticoagulation anymore-given above  HTN -moderate controle with Cardizem and Coreg  Alcoholic Delirium -resolved, awake and alert.  Pseudomonal UTI -on Levaquin-since 8/46  Chronic Systolic CHF -last Echo-EF 02/24/13-30-35 percent -clinically compensated  Disposition: Remain inpatient  DVT Prophylaxis: SCD's  Code Status: Full code   Family Communication None  Procedures: LINES / TUBES:  ETT 5/22>>>5/27  Left radial 5/22>>>5/27 5/18 OR >>> Craniotomy, hematoma evacuation 5/22 OR>> Reexploration of right pterional craniotomy for partial temporal lobectomy and evacuation of right temporal hematoma with implantation of the bone flap in the right lateral abdominal wall cavity   CONSULTS:  pulmonary/intensive care and Neurosurgery, CIR  Time spent 40 minutes-which includes 50% of the time with face-to-face with patient/ family and coordinating care related to the above assessment and plan.    MEDICATIONS: Scheduled Meds: . antiseptic oral rinse  15 mL Mouth Rinse q12n4p  . atorvastatin  20 mg Per Tube q1800  . carvedilol  25 mg Per Tube BID WC  . chlorhexidine  15 mL Mouth Rinse BID  . dexamethasone  4 mg Intravenous 4 times per day  . diltiazem  60 mg Oral 4 times per day  . feeding supplement (ENSURE COMPLETE)  237 mL Oral BID BM  . folic acid  1 mg Oral Daily  . insulin aspart  0-15 Units Subcutaneous 6 times per day  . levETIRAcetam  500 mg Oral BID  . levofloxacin  750 mg Oral Daily  . multivitamin with minerals  1 tablet Oral Daily  . pantoprazole  40 mg Oral QHS  . thiamine  100  mg Oral Daily   Continuous Infusions:  PRN Meds:.sodium chloride, acetaminophen (TYLENOL) oral liquid 160 mg/5 mL, ipratropium-albuterol, labetalol, ondansetron (ZOFRAN) IV,  ondansetron, RESOURCE THICKENUP CLEAR  Antibiotics: Anti-infectives   Start     Dose/Rate Route Frequency Ordered Stop   05/02/14 1000  levofloxacin (LEVAQUIN) tablet 750 mg     750 mg Oral Daily 05/01/14 1359     04/30/14 1100  levofloxacin (LEVAQUIN) IVPB 750 mg  Status:  Discontinued     750 mg 100 mL/hr over 90 Minutes Intravenous Every 24 hours 04/30/14 1001 05/01/14 1359   04/27/14 2200  vancomycin (VANCOCIN) IVPB 1000 mg/200 mL premix  Status:  Discontinued     1,000 mg 200 mL/hr over 60 Minutes Intravenous Every 12 hours 04/27/14 1006 04/30/14 1001   04/27/14 1030  cefTAZidime (FORTAZ) 1 g in dextrose 5 % 50 mL IVPB  Status:  Discontinued     1 g 100 mL/hr over 30 Minutes Intravenous Every 8 hours 04/27/14 1015 04/30/14 1001   04/27/14 1015  piperacillin-tazobactam (ZOSYN) IVPB 3.375 g  Status:  Discontinued     3.375 g 12.5 mL/hr over 240 Minutes Intravenous Every 8 hours 04/27/14 1004 04/27/14 1014   04/27/14 1015  vancomycin (VANCOCIN) 1,750 mg in sodium chloride 0.9 % 500 mL IVPB     1,750 mg 250 mL/hr over 120 Minutes Intravenous  Once 04/27/14 1006 04/27/14 1426   04/23/14 2200  ceFAZolin (ANCEF) IVPB 2 g/50 mL premix  Status:  Discontinued     2 g 100 mL/hr over 30 Minutes Intravenous 3 times per day 04/23/14 1750 04/27/14 0951   04/23/14 1638  bacitracin 50,000 Units in sodium chloride irrigation 0.9 % 500 mL irrigation  Status:  Discontinued       As needed 04/23/14 1639 04/23/14 1733   04/20/14 0200  ceFAZolin (ANCEF) IVPB 1 g/50 mL premix     1 g 100 mL/hr over 30 Minutes Intravenous Every 8 hours 04/19/14 2104 04/20/14 1011   04/19/14 1831  bacitracin 50,000 Units in sodium chloride irrigation 0.9 % 500 mL irrigation  Status:  Discontinued       As needed 04/19/14 1835 04/19/14 1917       PHYSICAL EXAM: Vital signs in last 24 hours: Filed Vitals:   05/01/14 1325 05/01/14 2008 05/02/14 0330 05/02/14 0551  BP: 129/67 115/64  116/87  Pulse: 84 70  80  Temp:  98.1 F (36.7 C) 97.5 F (36.4 C)  98.2 F (36.8 C)  TempSrc: Oral Oral  Oral  Resp: 16 18  16   Height:      Weight:   103.692 kg (228 lb 9.6 oz)   SpO2: 96% 93%  98%    Weight change: -0.998 kg (-2 lb 3.2 oz) Filed Weights   04/30/14 1511 05/01/14 0500 05/02/14 0330  Weight: 104.69 kg (230 lb 12.8 oz) 104.826 kg (231 lb 1.6 oz) 103.692 kg (228 lb 9.6 oz)   Body mass index is 34.77 kg/(m^2).   Gen Exam: Awake and alert with clear speech.   Neck: Supple, No JVD.   Chest: B/L Clear.   CVS: S1 S2 Regular, no murmurs.  Abdomen: soft, BS +, non tender, non distended.  Extremities: no edema, lower extremities warm to touch. Neurologic: Moves all 4 ext Skin: No Rash.   Wounds: N/A.   Intake/Output from previous day:  Intake/Output Summary (Last 24 hours) at 05/02/14 0959 Last data filed at 05/02/14 6789  Gross per 24 hour  Intake  995 ml  Output   1640 ml  Net   -645 ml     LAB RESULTS: CBC  Recent Labs Lab 04/26/14 0500 04/27/14 0545 04/28/14 0500 04/29/14 0308  WBC 10.1 9.8 9.1 12.5*  HGB 10.0* 9.4* 8.5* 10.3*  HCT 29.8* 28.4* 25.5* 30.6*  PLT 186 199 204 243  MCV 91.7 92.2 92.7 90.3  MCH 30.8 30.5 30.9 30.4  MCHC 33.6 33.1 33.3 33.7  RDW 14.2 14.2 14.4 13.5  LYMPHSABS  --   --  1.3 0.6*  MONOABS  --   --  0.5 0.3  EOSABS  --   --  0.2 0.0  BASOSABS  --   --  0.0 0.0    Chemistries   Recent Labs Lab 04/26/14 0500 04/27/14 0545 04/28/14 0500 04/29/14 0308 04/30/14 0318 05/01/14 0604  NA 145 146 147 141 143 140  K 3.8 3.9 4.0 4.0 3.8 4.0  CL 112 110 110 102 103 104  CO2 24 26 26 28 26 25   GLUCOSE 180* 132* 115* 148* 158* 147*  BUN 18 20 27* 26* 36* 38*  CREATININE 0.92 1.02 1.13 0.97 0.95 0.95  CALCIUM 8.5 8.6 8.4 9.1 9.5 9.1  MG 2.4  --   --   --  2.5  --     CBG:  Recent Labs Lab 05/01/14 1556 05/01/14 2011 05/02/14 0031 05/02/14 0431 05/02/14 0745  GLUCAP 164* 212* 146* 141* 131*    GFR Estimated Creatinine Clearance: 86.8  ml/min (by C-G formula based on Cr of 0.95).  Coagulation profile No results found for this basename: INR, PROTIME,  in the last 168 hours  Cardiac Enzymes No results found for this basename: CK, CKMB, TROPONINI, MYOGLOBIN,  in the last 168 hours  No components found with this basename: POCBNP,  No results found for this basename: DDIMER,  in the last 72 hours No results found for this basename: HGBA1C,  in the last 72 hours No results found for this basename: CHOL, HDL, LDLCALC, TRIG, CHOLHDL, LDLDIRECT,  in the last 72 hours No results found for this basename: TSH, T4TOTAL, FREET3, T3FREE, THYROIDAB,  in the last 72 hours No results found for this basename: VITAMINB12, FOLATE, FERRITIN, TIBC, IRON, RETICCTPCT,  in the last 72 hours No results found for this basename: LIPASE, AMYLASE,  in the last 72 hours  Urine Studies No results found for this basename: UACOL, UAPR, USPG, UPH, UTP, UGL, UKET, UBIL, UHGB, UNIT, UROB, ULEU, UEPI, UWBC, URBC, UBAC, CAST, CRYS, UCOM, BILUA,  in the last 72 hours  MICROBIOLOGY: Recent Results (from the past 240 hour(s))  CULTURE, BAL-QUANTITATIVE     Status: None   Collection Time    04/24/14  1:45 PM      Result Value Ref Range Status   Specimen Description BRONCHIAL ALVEOLAR LAVAGE   Final   Special Requests NONE   Final   Gram Stain     Final   Value: RARE WBC PRESENT,BOTH PMN AND MONONUCLEAR     NO SQUAMOUS EPITHELIAL CELLS SEEN     NO ORGANISMS SEEN     Performed at [facility]     Final   Value: 2,000 COLONIES/ML     Performed at [facility]   Culture     Final   Value: Non-Pathogenic Oropharyngeal-type Flora Isolated.     Performed at [facility]   Report Status 04/26/2014 FINAL   Final  CULTURE, BLOOD (ROUTINE X 2)  Status: None   Collection Time    04/27/14 11:22 AM      Result Value Ref Range Status   Specimen Description BLOOD RIGHT HAND   Final   Special Requests BOTTLES DRAWN AEROBIC  AND ANAEROBIC 5CC   Final   Culture  Setup Time     Final   Value: 04/27/2014 16:26     Performed at [facility]   Culture     Final   Value:        BLOOD CULTURE RECEIVED NO GROWTH TO DATE CULTURE WILL BE HELD FOR 5 DAYS BEFORE ISSUING A FINAL NEGATIVE REPORT     Performed at [facility]   Report Status PENDING   Incomplete  CULTURE, BLOOD (ROUTINE X 2)     Status: None   Collection Time    04/27/14 11:29 AM      Result Value Ref Range Status   Specimen Description BLOOD RIGHT HAND   Final   Special Requests     Final   Value: BOTTLES DRAWN AEROBIC AND ANAEROBIC 10CC AER 2CC ANA   Culture  Setup Time     Final   Value: 04/27/2014 16:26     Performed at [facility]   Culture     Final   Value:        BLOOD CULTURE RECEIVED NO GROWTH TO DATE CULTURE WILL BE HELD FOR 5 DAYS BEFORE ISSUING A FINAL NEGATIVE REPORT     Performed at [facility]   Report Status PENDING   Incomplete  URINE CULTURE     Status: None   Collection Time    04/28/14 10:10 AM      Result Value Ref Range Status   Specimen Description URINE, RANDOM   Final   Special Requests NONE   Final   Culture  Setup Time     Final   Value: 04/28/2014 12:46     Performed at [facility]     Final   Value: >=100,000 COLONIES/ML     Performed at [facility]   Culture     Final   Value: PSEUDOMONAS AERUGINOSA     Performed at [facility]   Report Status 04/30/2014 FINAL   Final   Organism ID, Bacteria PSEUDOMONAS AERUGINOSA   Final  CSF CULTURE     Status: None   Collection Time    04/28/14 10:10 AM      Result Value Ref Range Status   Specimen Description CSF   Final   Special Requests 1.0ML CSF FLUID   Final   Gram Stain     Final   Value: MODERATE WBC PRESENT,BOTH PMN AND MONONUCLEAR     NO ORGANISMS SEEN     Performed at [facility]     Performed at [facility]   Culture     Final   Value: NO GROWTH 3 DAYS      Performed at [facility]   Report Status 05/01/2014 FINAL   Final  GRAM STAIN     Status: None   Collection Time    04/28/14 10:10 AM      Result Value Ref Range Status   Specimen Description CSF   Final   Special Requests 1.0ML CSF FLUID   Final   Gram Stain     Final   Value: DIRECT SMEAR     MODERATE WBC PRESENT,BOTH PMN AND MONONUCLEAR  NO ORGANISMS SEEN   Report Status 04/28/2014 FINAL   Final    RADIOLOGY STUDIES/RESULTS: Ct Head Wo Contrast  04/28/2014   CLINICAL DATA:  Follow-up intracranial hemorrhage, postoperative evaluation.  EXAM: CT HEAD WITHOUT CONTRAST  TECHNIQUE: Contiguous axial images were obtained from the base of the skull through the vertex without intravenous contrast.  COMPARISON:  CT of the head Apr 23, 2014.  FINDINGS: Status post interval right decompressive craniectomy ; no external herniation of right cerebrum via through the defect. Small amount of right frontal extra-axial pneumocephalus without right extra-axial blood products on this examination. However, right frontal low-density 7 mm extra-axial fluid collection all seen. Larger right scalp low-density fluid collection and air with overlying skin staples.  Interval evacuation of the right temporal intraparenchymal hematoma with minimal residual intraparenchymal blood, surrounding low-density presumed vasogenic and possibly cytotoxic edema, mild right uncal herniation without midline shift. No hydrocephalus. Apparent remote small right cerebellar infarct.  Life support lines in place. Trace paranasal sinus mucosal thickening without air-fluid levels. The mastoid air cells are well aerated. 3 x 13 mm (transverse by AP) extraconal mass within the medial right orbit laterally displaces the medial rectus muscle, axial 13/94. Prominent appearance of the superior ophthalmic veins likely due to positive end pressure.  IMPRESSION: Status post interval right decompressive craniectomy and evacuation of right  temporal hematoma with residual low-density vasogenic and likely cytotoxic edema, mild right uncal herniation without midline shift.  Resolution of right extra-axial pneumocephalus and fluid collection, new 7 mm left frontal low-density presumed hygroma.  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  Larger right scalp fluid collection which may be postoperative (seroma versus less likely pseudomeningocele).   Electronically Signed   By: Elon Alas   On: 04/28/2014 05:24   Ct Head Wo Contrast  04/23/2014   CLINICAL DATA:  Followup after subdural hematoma evacuation. Patient anticoagulated.  EXAM: CT HEAD WITHOUT CONTRAST  TECHNIQUE: Contiguous axial images were obtained from the base of the skull through the vertex without contrast.  COMPARISON:  04/20/2014  FINDINGS: Continued improvement in the right subdural hematoma status post evacuation. Only minimal residual extra-axial fluid and air up to 5 mm thick.  Interval worsening of right temporal lobe intracerebral hematoma. A second component has developed in the more medial and posterior right temporal lobe 2.5 x 3.0 cm cross-section. Due to the location of this bleed, there is medial displacement of the uncus and hippocampus into the suprasellar cistern. Moderate worsening surrounding edema, not clearly vasogenic, could represent superimposed right temporal lobe MCA territory infarction with cytotoxic edema ( image 14 series 201). Recommend close clinical followup to exclude uncal herniation.  IMPRESSION: Continued improvement right subdural hematoma.  Worsening right temporal lobe intracerebral hematoma, possibly with superimposed temporal lobe infarct versus worsening vasogenic edema. Early uncal herniation.  Findings discussed with ordering provider.   Electronically Signed   By: Alejandro Flatten M.D.   On: 04/23/2014  13:15   Ct Head Wo Contrast  04/20/2014   CLINICAL DATA:  Followup subdural hematoma.  EXAM: CT HEAD WITHOUT CONTRAST  TECHNIQUE: Contiguous axial images were obtained from the base of the skull through the vertex without intravenous contrast.  COMPARISON:  CT HEAD W/O CM dated 04/19/2014  FINDINGS: Status post interval right frontotemporal craniotomy for evacuation of subdural hematoma with extra-axial drain in place via a right parietal burr  hole. Near complete evacuation of the extra-axial fluid collection, with small to moderate amount of right extra-axial pneumocephalus. 3 mm residual right to left midline shift, previously 10 mm. Re-expanded right lateral ventricle, no hydrocephalus.  Patchy supratentorial white matter hypodensities again seen, with less conspicuous right cerebellar hypodensity, though prior examination was technically superior. No acute large vascular territory infarct. Basal cisterns are patent, very mild right uncal herniation.  Trace paranasal sinus mucosal thickening without air-fluid levels. Patient is edentulous. Mastoid air cells are well aerated. Severe calcific atherosclerosis of the carotid siphons. Ocular globes and orbital contents are nonsuspicious.  Right posterior temporal lobe 28 x 22 mm evolving intraparenchymal hemorrhagic contusion with surrounding low-density vasogenic edema.  IMPRESSION: Interval right frontotemporal craniotomy for near complete evacuation of right subdural hematoma with or surgical drain in place. Minimal residual right to left midline shift, with re-expanded right lateral ventricle, no hydrocephalus.  Evolving right posterior temporal lobe hemorrhagic contusion.  Patchy white matter changes reflect chronic small vessel ischemic disease and/or, possible contusions, with less conspicuous right cerebellar hypodensity.   Electronically Signed   By: Elon Alas   On: 04/20/2014 04:59   Ct Head Wo Contrast  04/19/2014   CLINICAL DATA:  Choking  sensation with fall and left forehead lacerations.  EXAM: CT HEAD WITHOUT CONTRAST  TECHNIQUE: Contiguous axial images were obtained from the base of the skull through the vertex without intravenous contrast.  COMPARISON:  None.  FINDINGS: There is a large acute hemispheric right subdural hematoma, measuring up to 2.0 cm in thickness. There is resulting midline shift by 1 cm. A small amount of blood tracks along the right tentorium. Posteriorly in the right temporal lobe is a probable adjacent focus of hemorrhagic contusion measuring up to 2.3 x 2.7 cm transverse on image 11.  Patchy periventricular white matter disease is present bilaterally. 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 No signs of acute stroke are demonstrated. There is no hydrocephalus or intraventricular hemorrhage.  The visualized paranasal sinuses, mastoid air cells and middle ears are clear. The calvarium is intact.  IMPRESSION: 1. Large right hemispheric subdural hematoma with associated midline shift. 2. Moderate size hemorrhagic contusion involving the right temporal lobe posteriorly. 3. Underlying periventricular white matter disease. No signs of acute stroke or hydrocephalus. 4. Critical Value/emergent results were called by telephone at the time of interpretation on 04/19/2014 at 1:45 PM to Dr. Jola Schmidt, who verbally acknowledged these results.   Electronically Signed   By: Camie Patience M.D.   On: 04/19/2014 13:50   Dg Chest Port 1 View  04/29/2014   CLINICAL DATA:  Edema, followup  EXAM: PORTABLE CHEST - 1 VIEW  COMPARISON:  Portable chest x-ray of 04/28/2014  FINDINGS: The endotracheal tube has been removed as has the feeding tube. Aeration has improved and there has been improvement in the degree of pulmonary vascular congestion. Cardiomegaly is stable. Median sternotomy sutures are noted.  IMPRESSION: Endotracheal tube removed. Improved aeration with improvement in pulmonary vascular congestion.   Electronically  Signed   By: Ivar Drape M.D.   On: 04/29/2014 08:10   Dg Chest Port 1 View  04/28/2014   CLINICAL DATA:  ET tube evaluation.  Followup lung opacities.  EXAM: PORTABLE CHEST - 1 VIEW  COMPARISON:  04/27/2014.  FINDINGS: Changes from CABG surgery are stable. Cardiac silhouette is mildly enlarged. Vascular congestion, irregular interstitial opacities and hazy lung base opacity has mildly increased, most evident at the right lung base. There  are small effusions, larger on the left. No pneumothorax.  Endotracheal tube tip lies 2.7 cm above the carina. Enteric tube passes into the stomach, below the included field of view  IMPRESSION: Worsened lung aeration with increased irregular interstitial opacities and mild hazy airspace opacity consistent with worsened congestive heart failure.   Electronically Signed   By: Lajean Manes M.D.   On: 04/28/2014 08:05   Dg Chest Portable 1 View  04/27/2014   CLINICAL DATA:  Atelectasis/collapse.  EXAM: PORTABLE CHEST - 1 VIEW  COMPARISON:  04/26/2014 and 04/25/2014.  FINDINGS: 0552 hr. The endotracheal tube and feeding tube appear unchanged, the tip of the latter not visualized. There is stable cardiomegaly, pulmonary edema and left greater than right basilar airspace opacities. A small left pleural effusion is suspected. Patchy perihilar opacities on the right are stable. There is no pneumothorax.  IMPRESSION: Stable edema and bibasilar airspace opacities.   Electronically Signed   By: Camie Patience M.D.   On: 04/27/2014 08:07   Dg Chest Portable 1 View  04/26/2014   CLINICAL DATA:  Atelectasis  EXAM: PORTABLE CHEST - 1 VIEW  COMPARISON:  Prior chest x-ray 04/25/2014  FINDINGS: The endotracheal tube is 4.2 cm above the carina. A feeding tube is present, the tip is not identified however the tube extends off the scarring below the diaphragm. Stable cardiomegaly. Slightly improved aeration of the left lung base may reflect resolving atelectasis. There is persistent pulmonary  vascular congestion bordering on interstitial edema as well as foci of patchy opacity in the right upper and mid lung. 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 Atherosclerotic calcifications noted in the transverse aorta.  IMPRESSION: 1. Slightly improved aeration in the left lung base may reflect improving atelectasis. 2. Persistent patchy opacities in the right upper lung and right mid lung may reflect asymmetric edema, areas of subsegmental atelectasis or foci of infection/inflammation. 3. Stable cardiomegaly and pulmonary vascular congestion bordering on mild interstitial edema. 4. Stable and satisfactory support apparatus.   Electronically Signed   By: Jacqulynn Cadet M.D.   On: 04/26/2014 08:52   Dg Chest Portable 1 View  04/25/2014   CLINICAL DATA:  68 year old male with subdural hematoma. Respiratory failure and difficulty.  EXAM: PORTABLE CHEST - 1 VIEW  COMPARISON:  04/23/2014 and prior chest radiographs  FINDINGS: Cardiomegaly and prior cardiac surgical changes noted.  An endotracheal tube is is identified with tip 3.5 cm above the carina.  A small bore feeding tube is again identified entering the stomach with tip off the field of view.  Significantly improved left lung aeration is noted compatible with significant improved atelectasis. Persistent left lower lung opacity is noted and may represent residual atelectasis or airspace disease/pneumonia.  Mild pulmonary vascular congestion is noted.  Mild right basilar atelectasis is identified.  There is no evident thorax.  IMPRESSION: Significantly improved aeration within the left lung compatible with improved left lung atelectasis. Persistent left lower lung opacity may represent atelectasis or airspace disease pneumonia. .   Electronically Signed   By: Hassan Rowan M.D.   On: 04/25/2014 09:44   Dg Chest Port 1 View  04/23/2014   CLINICAL DATA:  Endotracheal tube placement.  Respiratory failure.  EXAM: PORTABLE  CHEST - 1 VIEW  COMPARISON:  04/19/2014  FINDINGS: Endotracheal tube tip is approximately 2 cm above the carina. Feeding tube tip is below the diaphragm.  There is almost complete opacification of the left hemi thorax with a  left pleural effusion. Heart and mediastinal structures are shifted to the left consistent with atelectasis in the left lung.  There is a focal linear atelectasis in the right upper lobe.  Pulmonary vascularity is normal.  No acute osseous abnormality.  IMPRESSION: New almost complete opacification of the left hemi thorax with a combination of atelectasis and pleural effusion.   Electronically Signed   By: Rozetta Nunnery M.D.   On: 04/23/2014 19:46   Dg Chest Port 1 View  04/19/2014   CLINICAL DATA:  Difficulty breathing  EXAM: PORTABLE CHEST - 1 VIEW  COMPARISON:  January 27, 2013  FINDINGS: There is mild interstitial edema. There is no airspace consolidation. Heart is enlarged with pulmonary vascularity within normal limits. Patient is status post median sternotomy. No adenopathy.  IMPRESSION: Suspect a degree of chronic congestive heart failure. There is slight generalized interstitial edema with cardiomegaly. There is no airspace consolidation.   Electronically Signed   By: Lowella Grip M.D.   On: 04/19/2014 16:15   Dg Abd Portable 1v  04/22/2014   CLINICAL DATA:  Panda tube placement  EXAM: PORTABLE ABDOMEN - 1 VIEW  COMPARISON:  DG ABD PORTABLE 1V dated 04/22/2014  FINDINGS: Metallic feeding tube tip has been advanced and again projects in the region of the body of the stomach. Bowel gas pattern is normal.  IMPRESSION: Interval advancement of the enteric feeding tube tip again projecting in the region of the body of the stomach.   Electronically Signed   By: Margaree Mackintosh M.D.   On: 04/22/2014 18:43   Dg Abd Portable 1v  04/22/2014   CLINICAL DATA:  Feeding tube placement.  EXAM: PORTABLE ABDOMEN - 1 VIEW  COMPARISON:  None.  FINDINGS: Feeding tube tip is in the body of the  stomach just below the gastroesophageal junction. Bowel gas pattern is normal.  IMPRESSION: Feeding tube tip in the body of the stomach just below the gastroesophageal junction.   Electronically Signed   By: Rozetta Nunnery M.D.   On: 04/22/2014 11:36    Alejandro Cummiskey Kristeen Mans, MD  [facility] Pager:[phone]  If 7PM-7AM, please contact night-coverage [email or web address] Password [email or web address] 05/02/2014, 9:59 AM   LOS: 13 days   **Disclaimer: This note may have been dictated with voice recognition software. Similar sounding words can inadvertently be transcribed and this note may contain transcription errors which may not have been corrected upon publication of note.**

## 2014-05-03 DIAGNOSIS — N390 Urinary tract infection, site not specified: Secondary | ICD-10-CM

## 2014-05-03 LAB — GLUCOSE, CAPILLARY
GLUCOSE-CAPILLARY: 289 mg/dL — AB (ref 70–99)
Glucose-Capillary: 139 mg/dL — ABNORMAL HIGH (ref 70–99)
Glucose-Capillary: 160 mg/dL — ABNORMAL HIGH (ref 70–99)
Glucose-Capillary: 171 mg/dL — ABNORMAL HIGH (ref 70–99)
Glucose-Capillary: 171 mg/dL — ABNORMAL HIGH (ref 70–99)
Glucose-Capillary: 234 mg/dL — ABNORMAL HIGH (ref 70–99)
Glucose-Capillary: 484 mg/dL — ABNORMAL HIGH (ref 70–99)

## 2014-05-03 LAB — CBC
HCT: 31.2 % — ABNORMAL LOW (ref 39.0–52.0)
Hemoglobin: 10.7 g/dL — ABNORMAL LOW (ref 13.0–17.0)
MCH: 30.2 pg (ref 26.0–34.0)
MCHC: 34.3 g/dL (ref 30.0–36.0)
MCV: 88.1 fL (ref 78.0–100.0)
Platelets: 322 10*3/uL (ref 150–400)
RBC: 3.54 MIL/uL — AB (ref 4.22–5.81)
RDW: 13.3 % (ref 11.5–15.5)
WBC: 15.8 10*3/uL — AB (ref 4.0–10.5)

## 2014-05-03 LAB — CULTURE, BLOOD (ROUTINE X 2)
CULTURE: NO GROWTH
Culture: NO GROWTH

## 2014-05-03 LAB — BASIC METABOLIC PANEL
BUN: 33 mg/dL — ABNORMAL HIGH (ref 6–23)
CALCIUM: 8.7 mg/dL (ref 8.4–10.5)
CO2: 24 meq/L (ref 19–32)
Chloride: 102 mEq/L (ref 96–112)
Creatinine, Ser: 0.88 mg/dL (ref 0.50–1.35)
GFR calc Af Amer: >90 mL/min (ref >90)
GFR calc non Af Amer: 86 mL/min — ABNORMAL LOW (ref >90)
Glucose, Bld: 127 mg/dL — ABNORMAL HIGH (ref 70–99)
Potassium: 4.3 mEq/L (ref 3.7–5.3)
SODIUM: 137 meq/L (ref 137–147)

## 2014-05-03 MED ORDER — ATORVASTATIN CALCIUM 20 MG PO TABS
20.0000 mg | ORAL_TABLET | Freq: Every day | ORAL | Status: DC
  Administered 2014-05-03: 20 mg via ORAL
  Filled 2014-05-03: qty 1

## 2014-05-03 MED ORDER — INSULIN ASPART 100 UNIT/ML ~~LOC~~ SOLN
0.0000 [IU] | Freq: Three times a day (TID) | SUBCUTANEOUS | Status: DC
  Administered 2014-05-03: 5 [IU] via SUBCUTANEOUS
  Administered 2014-05-04: 1 [IU] via SUBCUTANEOUS
  Administered 2014-05-04: 3 [IU] via SUBCUTANEOUS

## 2014-05-03 MED ORDER — DEXAMETHASONE 2 MG PO TABS: 2.0000 mg | ORAL_TABLET | Freq: Two times a day (BID) | ORAL | Status: DC

## 2014-05-03 MED ORDER — DEXAMETHASONE SODIUM PHOSPHATE 4 MG/ML IJ SOLN: 4.0000 mg | Freq: Three times a day (TID) | INTRAMUSCULAR | Status: DC

## 2014-05-03 MED ORDER — DEXAMETHASONE 4 MG PO TABS
4.0000 mg | ORAL_TABLET | Freq: Three times a day (TID) | ORAL | Status: DC
  Administered 2014-05-03 – 2014-05-04 (×4): 4 mg via ORAL
  Filled 2014-05-03 (×6): qty 1

## 2014-05-03 MED ORDER — CARVEDILOL 25 MG PO TABS
25.0000 mg | ORAL_TABLET | Freq: Two times a day (BID) | ORAL | Status: DC
  Administered 2014-05-03 – 2014-05-04 (×3): 25 mg via ORAL
  Filled 2014-05-03 (×3): qty 1

## 2014-05-03 MED ORDER — DEXAMETHASONE 2 MG PO TABS: 2.0000 mg | ORAL_TABLET | Freq: Three times a day (TID) | ORAL | Status: DC

## 2014-05-03 MED ORDER — DEXAMETHASONE 2 MG PO TABS: 2.0000 mg | ORAL_TABLET | Freq: Every day | ORAL | Status: DC

## 2014-05-03 MED ORDER — DEXAMETHASONE 4 MG PO TABS: 4.0000 mg | ORAL_TABLET | Freq: Two times a day (BID) | ORAL | Status: DC

## 2014-05-03 NOTE — Progress Notes (Signed)
Rehab admissions - I am following pt's case and am waiting for insurance authorization from [facility] for possible inpatient rehab stay.   I met with pt to share this update and answered further questions about CIR. I then called and spoke with pt's wife to give update.  I made note of nursing note mentioning small amount of blood clots. I spoke with Dr. Clementeen Graham about pt's medical status and explained that we are waiting on insurance authorization.  I will keep the pt/family and medical team aware of any updates. I also updated Neoma Laming, Tourist information centre manager as well. Please call me with any questions. Thanks.  Nanetta Batty, PT Rehabilitation Admissions Coordinator [phone]

## 2014-05-03 NOTE — PMR Pre-admission (Signed)
PMR Admission Coordinator Pre-Admission Assessment  Patient: Alejandro [name] is an 68 y.o., male MRN: [id number] DOB: Aug 04, 1946 Height: 5' 8" (172.7 cm) Weight: 104.282 kg (229 lb 14.4 oz)              Insurance Information HMO:     PPO:  yes    PCP:      IPA:      80/20:      OTHER:  PRIMARY: [employer] [place]      Policy#: [id number]      Subscriber: self CM Name: Laney Potash      Phone#: [phone]     Fax#: [phone] Updates due to Pat on 6-10 at above fax or phone. Approval given for 10 days through May 13, 2014. Pre-Cert#: [id number]      Employer: working full time at [employer]:  Phone #: [phone]     Name: Algis Downs. Date: 12/03/10     Deduct: $800 (met all)      Out of Pocket Max: $3000 (met [id number])      Life Max: Unlimited CIR: 80/20% (pre-auth needed)      SNF: 80/20% (pre-auth needed), visit limit based on medical necessity Outpatient: 80%     Co-Pay: 20% (no copay, 30 visit limit) Home Health: 80%       Co-Pay: 20% (pre-auth needed), visit limit based on medical necessity DME: 80%     Co-Pay: 20% Providers: in network  Emergency Contact Information Contact Information   Name [place] [phone]     Cramsor,Chrstie Daughter [phone]  [phone]     Current Medical History  Patient Admitting Diagnosis: right temporal contusion/SDH s/p craniotomy and then craniectomy/lobectomy  History of Present Illness: Alejandro [name] is a 68 y.o.right handed male with A fib on chronic coumadin,CAD with CABG alcohol and tobacco abuse. Admitted 04/19/2014 after noted fall with laceration on his head. Cranial CT scan showed subdural hematoma with midline shift, hemorrhagic contusion on the right temporal lobe as well as right matter disease. INR on admission of 2.5 and reversed. Underwent right craniotomy for evacuation of acute subdural hematoma 04/19/2014 per Dr. Saintclair Halsted. Maintained on Stuckey for seizure prophylaxis.  Initial postoperative CT scan stable with again repeat serial cranial CT scan 04/23/2014 showing expansion of right temporal intracerebral hematoma and possible venous infarction with temporal lobe swelling. Underwent reexploration of right craniotomy for partial temporal lobectomy and evacuation of right temporal hematoma with implantation of a bone flap in the right lateral abdominal wall cavity 04/23/2014. Patient remained intubated until 04/28/2014. 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 Wound culture Gram stain pending placed on empiric vancomycin as well as Tressie Ellis. Currently on a dysphagia 1 nectar thick liquid diet. Await formal followup physical and occupational therapy.  M.D. has requested physical medicine rehabilitation consult.   Past Medical History  Past Medical History  Diagnosis Date  . Systolic heart failure   . 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 Dyslipidemia   . HTN (hypertension)   . Atrial fibrillation   . Cancer     around nose  . Tobacco abuse   . Alcohol abuse   . H/O hiatal hernia     Family History  family history includes Diabetes in his father and mother; Heart disease in his brother, father, and mother. There is no history of Colon cancer or Prostate cancer.  Prior Rehab/Hospitalizations: none   Current Medications  Current facility-administered medications:0.9 %  sodium chloride infusion, 250 mL, Intravenous, PRN, Rahul P Desai, PA-C;  acetaminophen (TYLENOL) solution 650 mg, 650 mg, Per Tube, Q4H PRN, Raylene Miyamoto, MD, 650 mg at 04/27/14 1029;  antiseptic oral rinse (BIOTENE) solution 15 mL, 15 mL, Mouth Rinse, q12n4p, Rush Farmer, MD, 15 mL at 05/04/14 1222 atorvastatin (LIPITOR)  tablet 20 mg, 20 mg, Oral, q1800, Nishant Dhungel, MD, 20 mg at 05/03/14 1832;  carvedilol (COREG) tablet 25 mg, 25 mg, Oral, BID WC, Nishant Dhungel, MD, 25 mg at 05/04/14 0855;  chlorhexidine (PERIDEX) 0.12 % solution 15 mL, 15 mL, Mouth Rinse, BID, Rush Farmer, MD, 15 mL at 05/04/14 0855;  [START ON 05/09/2014] dexamethasone (DECADRON) tablet 2 mg, 2 mg, Oral, 3 times per day, Nishant Dhungel, MD [START ON 05/12/2014] dexamethasone (DECADRON) tablet 2 mg, 2 mg, Oral, Q12H, Nishant Dhungel, MD;  Derrill Memo ON 05/15/2014] dexamethasone (DECADRON) tablet 2 mg, 2 mg, Oral, Daily, Nishant Dhungel, MD;  dexamethasone (DECADRON) tablet 4 mg, 4 mg, Oral, 3 times per day, Nishant Dhungel, MD, 4 mg at 05/04/14 1416;  [START ON 05/06/2014] dexamethasone (DECADRON) tablet 4 mg, 4 mg, Oral, Q12H, Nishant Dhungel, MD diltiazem (CARDIZEM) tablet 60 mg, 60 mg, Oral, 4 times per day, Raylene Miyamoto, MD, 60 mg at 05/04/14 [phone];  feeding supplement (ENSURE COMPLETE) (ENSURE COMPLETE) liquid 237 mL, 237 mL, Oral, BID BM, Heather Cornelison Pitts, RD, 237 mL at 29/19/16 6060;  folic acid (FOLVITE) tablet 1 mg, 1 mg, Oral, Daily, Rush Farmer, MD, 1 mg at 05/04/14 1011 insulin aspart (novoLOG) injection 0-9 Units, 0-9 Units, Subcutaneous, TID WC, Nishant Dhungel, MD, 3 Units at 05/04/14 1222;  ipratropium-albuterol (DUONEB) 0.5-2.5 (3) MG/3ML nebulizer solution 3 mL, 3 mL, Nebulization, Q6H PRN, Tanda Rockers, MD;  labetalol (NORMODYNE,TRANDATE) injection 10-40 mg, 10-40 mg, Intravenous, Q10 min PRN, [name], MD, 20 mg at 04/24/14 0459 levETIRAcetam (KEPPRA) 100 MG/ML solution 500 mg, 500 mg, Oral, BID, Rush Farmer, MD, 500 mg at 05/04/14 1012;  levofloxacin (LEVAQUIN) tablet 750 mg, 750 mg, Oral, Daily, Rocky Crafts Hammons, RPH, 750 mg at 05/04/14 1011;  multivitamin with minerals tablet 1 tablet, 1 tablet, Oral, Daily, Raylene Miyamoto, MD, 1 tablet at 05/04/14 1011 ondansetron (ZOFRAN) injection 4 mg, 4 mg,  Intravenous, Q4H PRN, [name], MD, 4 mg at 04/28/14 1446;  ondansetron (ZOFRAN) tablet 4 mg, 4 mg, Oral, Q4H PRN, [name], MD;  pantoprazole (PROTONIX) EC tablet 40 mg, 40 mg, Oral, QHS, Charmian Muff Pixley, RPH, 40 mg at 05/03/14 2129;  RESOURCE THICKENUP CLEAR, , Oral, PRN, Raylene Miyamoto, MD thiamine (VITAMIN B-1) tablet 100 mg, 100 mg, Oral, Daily, Rush Farmer, MD, 100 mg at 05/04/14 1011  Patients Current Diet: Dysphagia level 3 with thin liquids, sit upright with intermittent supervision  Precautions / Restrictions Precautions Precautions: Fall Restrictions Weight Bearing Restrictions: No   Prior Activity Level Community (5-7x/wk): pt states he got out nearly everyday, was driving and would go out to get the paper each morning. He was also working at [employer] and had not "quite" retired from his work.  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 He said normally I would get up and get it myself. I asked him if he should get up by himself he said no. When asked why he said he shouldn't mentally. I asked if there was any other reason he should not get up and he said that his legs were weak. I told him yes it was mentally and physically that he should not get up (mentally is remembering he  should not get up and leg weakness is the reason he should not get up) Attention: Sustained Sustained Attention: Impaired Sustained Attention Impairment: Verbal basic;Functional basic Memory: Impaired Memory Impairment: Storage deficit;Retrieval deficit;Decreased short term memory Decreased Short Term Memory: Verbal basic;Functional basic Awareness: Impaired Awareness Impairment: Intellectual impairment Problem Solving: Impaired Executive Function: Reasoning;Organizing;Decision Making Reasoning: Impaired Reasoning Impairment: Verbal basic Organizing: Impaired Organizing Impairment: Verbal basic Decision Making: Impaired Decision Making Impairment: Verbal basic Safety/Judgment: Impaired [employer] Scales of Cognitive Functioning: Purposeful/appropriate    Extremity Assessment (includes Sensation/Coordination)          ADLs  Overall ADL's : Needs assistance/impaired Eating/Feeding: Set up;Supervision/ safety;Sitting Grooming: Oral care;Standing;Min guard Grooming Details (indicate cue type and reason): Cueing to look for the item in his bucket of toiletries that looked like toothpaste since he did not have glasses to actullay read the print on the containers that were in his bucket Upper Body Bathing: Minimal assitance Lower Body Bathing: Moderate assistance;Sit to/from stand Upper Body Dressing : Moderate assistance;Sitting Lower Body Dressing: Min guard;Sit to/from stand Lower Body Dressing Details (indicate cue type and reason): able to don/doff socks in sitting with supervision.  Requires assist with balance and pulling pants over hips  Toilet Transfer: Minimal assistance (recliner> around bed to sink>recliner) Toileting- Clothing Manipulation and Hygiene: Moderate assistance Toileting - Clothing Manipulation Details (indicate cue type and reason): foley Functional mobility during ADLs: Minimal assistance General ADL Comments: Functional mobility and ADL affected by  general weakness and cognitive deficits.    Mobility  Overal bed mobility: Needs Assistance Bed Mobility: Supine to Sit Supine to sit: Min assist General bed mobility comments: use of rail and min assist to pull up with 1 UE    Transfers  Overall transfer level: Needs assistance Equipment used: Rolling walker (2 wheeled) Transfers: Sit to/from Stand Sit to Stand: Min assist;Mod assist Stand pivot transfers: Mod assist General transfer comment: mod assist from low toilet, cues for UE use and rail.    Ambulation / Gait / Stairs / Wheelchair Mobility  Ambulation/Gait Ambulation/Gait assistance: Museum/gallery curator (Feet): 75 Feet Assistive device: Rolling walker (2 wheeled) Gait Pattern/deviations: Step-to pattern;Step-through pattern;Staggering left;Staggering right;Decreased weight shift to right;Decreased weight shift to left Gait velocity: slow General Gait Details: more steady assist to turn  around, side step and maneuvre around obstacles.    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, 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 Living Setting: Patient's home Type of Home at Discharge: House Discharge Home Layout: One level Discharge Home Access: Stairs to enter Entrance Stairs-Rails: None Entrance Stairs-Number of Steps: 3 Does the  patient have any problems obtaining your medications?: No  Social/Family/Support Systems Patient Roles: Spouse Contact Information: wife Pamala Hurry is primary contact Anticipated Caregiver: wife Anticipated Caregiver's Contact Information: see above Ability/Limitations of Caregiver: no limitations Caregiver Availability: 24/7 (wife does help with granddtr but can be avail for pt 24-7) Discharge Plan Discussed with Primary Caregiver: Yes Is Caregiver In Agreement with Plan?: Yes Does Caregiver/Family have Issues with Lodging/Transportation while Pt is in Rehab?: No  Goals/Additional Needs Patient/Family Goal for Rehab: Supervision for PT/OT and minimal to moderate assist with SLP Expected length of stay: 13-18 days Cultural Considerations: Christian faith Dietary Needs: dys 3, mechanical soft with thin liquids.  (Sit upright with intermittent supervision) Equipment Needs: to be determined Pt/Family Agrees to Admission and willing to participate: Yes (spoke with pt's wife by phone on 5-29, 6-1 and 6-2) Program Orientation Provided & Reviewed with Pt/Caregiver Including Roles  & Responsibilities: Yes   Decrease burden of Care through IP rehab admission: NA   Possible need for SNF placement upon discharge: not anticipated   Patient Condition: This patient's medical and functional status has changed since the consult dated: 04-29-14 in which the Rehabilitation Physician determined and documented that the patient's condition is appropriate for intensive rehabilitative care in an inpatient rehabilitation facility. See "History of Present Illness" (above) for medical update. Functional changes are: minimal assistance to ambulate 75' and minimal to moderate assistance with ADLs. Patient's medical and functional status update has been discussed with the Rehabilitation physician and patient remains appropriate for inpatient rehabilitation. Will admit to inpatient rehab today.  Preadmission Screen  Completed By:  Ave Filter, PT 05/04/2014 3:02 PM ______________________________________________________________________   Discussed status with Dr. Naaman Plummer on 05-04-14 at 1505 and received telephone approval for admission today.  Admission Coordinator:  Quentin Mulling Divine Imber, PT time 1505/Date 05-04-14

## 2014-05-03 NOTE — Progress Notes (Signed)
Physical Therapy Treatment Patient Details Name: Alejandro [name] MRN: [id number] DOB: 15-Jun-1946 Today's Date: 05/03/2014    History of Present Illness History of present illness: Patient is a 68 year old gentleman who underwent craniotomy for evacuation of a right acute subdural hematoma on the night patient initially did very well however then had to be sedated for presumptive alcohol withdrawal symptoms. Initial followup CT scan showed excellent evacuation of the subdural hematoma in stable small right temporal contusion followup CT scan 40 hours later showed expansion of his right temporal intracerebral hematoma and possible venous infarction with temporal lobe swelling and possible impending uncal herniation. Extubated 5/27.    PT Comments    Patient progressing well. Able to walk in the room this session. Still requiring max cues for technique and general safety concerns. Continue to recommend comprehensive inpatient rehab (CIR) for post-acute therapy needs.   Follow Up Recommendations  CIR     Equipment Recommendations  None recommended by PT    Recommendations for Other Services       Precautions / Restrictions Precautions Precautions: Fall    Mobility  Bed Mobility Overal bed mobility: Needs Assistance       Supine to sit: Min assist     General bed mobility comments: Vcs for hand placement, assist to elevate to EOB.   Transfers Overall transfer level: Needs assistance Equipment used: Rolling walker (2 wheeled);2 person hand held assist   Sit to Stand: Mod assist         General transfer comment: Heavy reliance on RW, assist to come to standing position, VCs for hand placement and safety with use of RW. Mod A from lower surface  Ambulation/Gait Ambulation/Gait assistance: Min assist;+2 safety/equipment Ambulation Distance (Feet): 25 Feet Assistive device: Rolling walker (2 wheeled) Gait Pattern/deviations: Step-to pattern;Wide base of support;Ataxic;Trunk  flexed Gait velocity: slow   General Gait Details: Patient able to walk with max cues and min A for RW positioning. Patient staggering and at times feet out of RW. Cues for positioning inside of RW. Min A for balance with turns   Stairs            Wheelchair Mobility    Modified Rankin (Stroke Patients Only)       Balance     Sitting balance-Leahy Scale: Fair       Standing balance-Leahy Scale: Poor                      Cognition Arousal/Alertness: Awake/alert Behavior During Therapy: WFL for tasks assessed/performed Overall Cognitive Status: Impaired/Different from baseline Area of Impairment: Orientation Orientation Level: Situation   Memory: Decreased recall of precautions;Decreased short-term memory Following Commands: Follows one step commands consistently Safety/Judgement: Decreased awareness of safety;Decreased awareness of deficits   Problem Solving: Slow processing;Decreased initiation;Difficulty sequencing;Requires verbal cues;Requires tactile cues      Exercises      General Comments        Pertinent Vitals/Pain Denied pain    Home Living                      Prior Function            PT Goals (current goals can now be found in the care plan section) Progress towards PT goals: Progressing toward goals    Frequency  Min 4X/week    PT Plan Current plan remains appropriate    Co-evaluation             End  of Session Equipment Utilized During Treatment: Gait belt Activity Tolerance: Patient tolerated treatment well Patient left: in chair;with call bell/phone within reach;with chair alarm set;with family/visitor present     Time: 7076-1518 PT Time Calculation (min): 28 min  Charges:  $Gait Training: 23-37 mins                    G Codes:      Tonia Brooms Robinette 05/03/2014, 3:07 PM 05/03/2014 [place] PTA [phone] pager [phone] office

## 2014-05-03 NOTE — Progress Notes (Signed)
Occupational Therapy Treatment Patient Details Name: Alejandro [name] MRN: [id number] DOB: 24-Sep-1946 Today's Date: 05/03/2014    History of present illness History of present illness: Patient is a 68 year old gentleman who underwent craniotomy for evacuation of a right acute subdural hematoma on the night patient initially did very well however then had to be sedated for presumptive alcohol withdrawal symptoms. Initial followup CT scan showed excellent evacuation of the subdural hematoma in stable small right temporal contusion followup CT scan 40 hours later showed expansion of his right temporal intracerebral hematoma and possible venous infarction with temporal lobe swelling and possible impending uncal herniation. 11/22  Reexploration of right pterional craniotomy for partial temporal lobectomy and evacuation of right temporal hematoma with implantation of the bone flap in the right lateral abdominal wall cavity. Extubated 5/27.   OT comments  This 68 yo male making progress since eval, continues to benefit from therapy on acute with follow up on inpatient rehab to get to an Independent to S level. Grooming goal met.  Follow Up Recommendations  CIR    Equipment Recommendations  3 in 1 bedside comode;Tub/shower bench    Recommendations for Other Services Rehab consult    Precautions / Restrictions Precautions Precautions: Fall Restrictions Weight Bearing Restrictions: No       Mobility Bed Mobility Overal bed mobility: Needs Assistance       Supine to sit: Min assist     General bed mobility comments: Pt up in recliner upon arrival  Transfers Overall transfer level: Needs assistance Equipment used: Rolling walker (2 wheeled) Transfers: Sit to/from Stand Sit to Stand: Min guard         General transfer comment: Heavy reliance on RW, assist to come to standing position, VCs for hand placement and safety with use of RW. Mod A from lower surface    Balance     Sitting  balance-Leahy Scale: Fair       Standing balance-Leahy Scale: Poor                     ADL Overall ADL's : Needs assistance/impaired     Grooming: Oral care;Standing;Min guard Grooming Details (indicate cue type and reason): Cueing to look for the item in his bucket of toiletries that looked like toothpaste since he did not have glasses to actullay read the print on the containers that were in his bucket             Lower Body Dressing: Min guard;Sit to/from stand   Toilet Transfer: Minimal assistance (recliner> around bed to sink>recliner)           Functional mobility during ADLs: Minimal assistance        Vision                 Additional Comments: Pt still showing mild inattention to left when up and about with his RW (evidenced by walking close the wall on his left side as he went around the foot of the bed and then on coming back around the bed to the recliner he ran into the footboard of the bed on his left with the RW          Cognition   Behavior During Therapy: [facility] for tasks assessed/performed Overall Cognitive Status: Impaired/Different from baseline Area of Impairment: Orientation Orientation Level: Situation Current Attention Level: Selective Memory: Decreased short-term memory  Following Commands: Follows one step commands consistently Safety/Judgement: Decreased awareness of safety;Decreased awareness of deficits   Problem  Solving: Requires verbal cues (see ADL grooming) General Comments: When asked what he would do if he needed something and there was no one in the room with him what would he do. He said normally I would get up and get it myself. I asked him if he should get up by himself he said no. When asked why he said he shouldn't mentally. I asked if there was any other reason he should not get up and he said that his legs were weak. I told him yes it was mentally and physically that he should not get up (mentally is remembering he  should not get up and leg weakness is the reason he should not get up)    Extremity/Trunk Assessment  Upper Extremity Assessment Upper Extremity Assessment: Generalized weakness                 Pertinent Vitals/ Pain       No c/o pain  Home Living Family/patient expects to be discharged to:: Inpatient rehab Living Arrangements: Spouse/significant other                               Additional Comments: pt was indep and worked full time in [place] With: Spouse    Prior Functioning/Environment Level of Independence: Independent        Comments: Pt worked as an Pharmacist, 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

## 2014-05-03 NOTE — H&P (Signed)
Physical Medicine and Rehabilitation Admission H&P    Chief Complaint  Patient presents with  . Fall  . on blood thinners   : HPI: Alejandro [name] is a 68 y.o.right handed male with A fib on chronic coumadin,CAD with CABG, alcohol and tobacco abuse. Admitted 04/19/2014 after noted fall with laceration on his head. Cranial CT scan showed subdural hematoma with midline shift, hemorrhagic contusion on the right temporal lobe as well as right matter disease. INR on admission of 2.5 and reversed. Underwent right craniotomy for evacuation of acute subdural hematoma 04/19/2014 per Dr. Saintclair Halsted. Maintained on Trion for seizure prophylaxis. Initial postoperative CT scan stable with again repeat serial cranial CT scan 04/23/2014 showing expansion of right temporal intracerebral hematoma and possible venous infarction with temporal lobe swelling. Underwent reexploration of right craniotomy for partial temporal lobectomy and evacuation of right temporal hematoma with implantation of a bone flap in the right lateral abdominal wall cavity 04/23/2014. Patient remained intubated until 04/28/2014 and follow up per critical care services. Noted wound with some clear drainage out of one area posterior aspect of incision suspicious for CSF that has since resolved . Maintained on steroid protocol with taper. Wound culture Gram stain no organisms seen. A urine study 04/28/2014 greater than 100,000 Pseudomonas maintained on Levaquin. . Currently on mechanical soft thin liquid diet. Followup physical and occupational therapy evaluations with recommendations for physical medicine rehabilitation consult. Patient was admitted for comprehensive rehabilitation program.   ROS Review of Systems  Cardiovascular: Positive for palpitations.  Musculoskeletal: Positive for falls and myalgias.  All other systems reviewed and are negative  Past Medical History  Diagnosis Date  . Systolic heart failure   . CAD (coronary artery  disease)     s/p CABGx4 on 12/24/2008  . Dyslipidemia   . HTN (hypertension)   . Atrial fibrillation   . Cancer     around nose  . Tobacco abuse   . Alcohol abuse   . H/O hiatal hernia    Past Surgical History  Procedure Laterality Date  . Coronary artery bypass graft  12/24/2008    4 vessel  . Umbilical hernia repair  2010  . Hernia repair  2012  . Coronary artery bypass graft  2012  . Ventral hernia repair N/A 01/22/2013    Procedure: HERNIA REPAIR VENTRAL ADULT;  Surgeon: [name], MD;  Location: [place];  Service: General;  Laterality: N/A;  . Application of a-cell of chest/abdomen N/A 01/22/2013    Procedure: APPLICATION OF A-CELL OF CHEST/ABDOMEN;  Surgeon: [name], MD;  Location: [facility];  Service: General;  Laterality: N/A;  . Cystoscopy N/A 01/22/2013    Procedure: Consuela Mimes;  Surgeon: [name], MD;  Location: [place];  Service: Urology;  Laterality: N/A;  Cystoscopy with balloon dilation. Insertion of coude catheter.  . Craniotomy Right 04/19/2014    Procedure: CRANIOTOMY HEMATOMA EVACUATION SUBDURAL;  Surgeon: [name], MD;  Location: [place] [facility];  Service: Neurosurgery;  Laterality: Right;  right  . Craniotomy Right 04/23/2014    Procedure: Craniotomy for Intracerebral Hemorrhage;  Surgeon: [name], MD;  Location: [place] [facility];  Service: Neurosurgery;  Laterality: Right;   Family History  Problem Relation Age of Onset  . Diabetes Mother   . Heart disease Mother   . Diabetes Father   . Heart disease Father   . Heart disease Brother     s/p CABG at 38  . Colon cancer  Neg Hx   . Prostate cancer Neg Hx    Social History:  reports that he has been smoking Cigarettes.  He has a 50 pack-year smoking history. He has never used smokeless tobacco. He reports that he drinks about 6 ounces of alcohol per week. He reports that he does not use illicit drugs. Allergies: No Known Allergies Medications Prior to Admission  Medication Sig Dispense Refill  .  carvedilol (COREG) 25 MG tablet Take 1 tablet (25 mg total) by mouth 2 (two) times daily with a meal.  60 tablet  3  . furosemide (LASIX) 80 MG tablet Take 0.5 tablets (40 mg total) by mouth daily.  45 tablet  1  . ibuprofen (ADVIL,MOTRIN) 200 MG tablet Take 200 mg by mouth every 6 (six) hours as needed for pain.       Marland Kitchen lisinopril (PRINIVIL,ZESTRIL) 20 MG tablet TAKE 1 TABLET EVERY DAY  90 tablet  1  . simvastatin (ZOCOR) 40 MG tablet Take 40 mg by mouth every evening.      . warfarin (COUMADIN) 2 MG tablet Take 2-4 mg by mouth daily. Takes 42m every day but Monday and takes 422mon monday        Home: [place] to be discharged to:: Inpatient rehab Living Arrangements: Spouse/significant other Additional Comments: pt was indep and worked full time  Lives With: Spouse   Functional History: Prior Function Level of Independence: Independent Comments: Pt worked as an elPharmacist, hospitaloing inHealth and safety inspectortatus:  Mobility: BeMetaed mobility: Needs Assistance Bed Mobility: Supine to Sit Supine to sit: Mod assist General bed mobility comments: Vcs for hand placement, assist to elevate to EOB, initial assist for balance Transfers Overall transfer level: Needs assistance Equipment used: Rolling walker (2 wheeled);2 person hand held assist Transfers: Sit to/from [employer] to Stand: Mod assist Stand pivot transfers: Mod assist General transfer comment: Heavy reliance on RW, assist to come to standing position, VCs for hand placement and safety with use of RW Ambulation/Gait Ambulation/Gait assistance: Mod assist Ambulation Distance (Feet): 6 Feet Assistive device: Rolling walker (2 wheeled) Gait Pattern/deviations: Step-through pattern;Decreased stride length;Shuffle;Narrow base of support;Trunk flexed;Drifts right/left Gait velocity: slow General Gait Details: pivotal steps to chair     ADL: ADL Overall ADL's : Needs assistance/impaired Eating/Feeding: Set up;Supervision/ safety;Sitting Grooming: Set up;Supervision/safety;Cueing for safety;Cueing for sequencing Grooming Details (indicate cue type and reason): Applied deoderant to his entire upper body Upper Body Bathing: Minimal assitance Lower Body Bathing: Moderate assistance;Sit to/from stand Upper Body Dressing : Moderate assistance;Sitting Lower Body Dressing: Moderate assistance;Sit to/from stand Lower Body Dressing Details (indicate cue type and reason): able to don/doff socks in sitting with supervision.  Requires assist with balance and pulling pants over hips  Toilet Transfer: Moderate assistance;Stand-pivot;BSC Toileting- Clothing Manipulation and Hygiene: Moderate assistance Toileting - Clothing Manipulation Details (indicate cue type and reason): foley Functional mobility during ADLs: Moderate assistance General ADL Comments: Functional mobility and ADL affected by general weakness and cognitive deficits.  Cognition: Cognition Overall Cognitive Status: Impaired/Different from baseline Arousal/Alertness: Awake/alert Orientation Level: Oriented X4 Attention: Sustained Sustained Attention: Impaired Sustained Attention Impairment: Verbal basic;Functional basic Memory: Impaired Memory Impairment: Storage deficit;Retrieval deficit;Decreased short term memory Decreased Short Term Memory: Verbal basic;Functional basic Awareness: Impaired Awareness Impairment: Intellectual impairment Problem Solving: Impaired Executive Function: Reasoning;Organizing;Decision Making Reasoning: Impaired Reasoning Impairment: Verbal basic Organizing: Impaired Organizing Impairment: Verbal basic Decision Making: Impaired Decision Making Impairment: Verbal basic Safety/Judgment: Impaired [place]  [employer] Scales of Cognitive Functioning: Automatic/appropriate (with emerging VIII behaviors. ) Cognition Arousal/Alertness:  Awake/alert Behavior During Therapy: Flat affect Overall Cognitive Status: Impaired/Different from baseline Area of Impairment: Orientation;Attention;Memory;Following commands;Safety/judgement;Awareness;Problem solving Orientation Level: Disoriented to;Time Current Attention Level: Sustained Memory: Decreased recall of precautions;Decreased short-term memory Following Commands: Follows one step commands consistently Safety/Judgement: Decreased awareness of safety;Decreased awareness of deficits Awareness: Emergent Problem Solving: Slow processing;Decreased initiation;Difficulty sequencing;Requires verbal cues;Requires tactile cues General Comments: Distracted dueint eval by tele wires. Impulsive. confabulating  Physical Exam: Blood pressure 146/84, pulse 101, temperature 97.9 F (36.6 C), temperature source Oral, resp. rate 18, height _0  (1.727 m), weight 103.692 kg (228 lb 9.6 oz), SpO2 95.00%. Physical Exam Sitting in chair. No distress. obese Eyes:  Pupils round and reactive to light  Mouth: edentulous, mucosa pink and moist Head: craniectomy site sunken in. No drainage. Wound well approximated  Neck: Normal range of motion. Neck supple. No thyromegaly present.  Cardiovascular:  Cardiac rate controlled without murmur Respiratory: Effort normal and breath sounds normal. No respiratory distress.  GI: Soft. Bowel sounds are normal. He exhibits no distension.  Flap site dressed/intact. Minimally tender Neurological: He is alert.  Makes good eye contact with examiner.  Far left upper quadrant of vision may be affected. No neglect. appropriate for name, place, age. Bilat mittens in place.Followed simple commands. Moves all 4 limbs with ease.   Can follow simple commands. Improved insight and awareness. Recalls most biographical information. Non-impulsive. Sensory exam intact. dtr's 1+.  Skin:  Intact outside surgical sites Psych: pleasant, cooperative, nonimpulsive,  non-anxious  Results for orders placed during the hospital encounter of 04/19/14 (from the past 48 hour(s))  GLUCOSE, CAPILLARY     Status: Abnormal   Collection Time    05/01/14 12:08 PM      Result Value Ref Range   Glucose-Capillary 190 (*) 70 - 99 mg/dL  GLUCOSE, CAPILLARY     Status: Abnormal   Collection Time    05/01/14  3:56 PM      Result Value Ref Range   Glucose-Capillary 164 (*) 70 - 99 mg/dL  GLUCOSE, CAPILLARY     Status: Abnormal   Collection Time    05/01/14  8:11 PM      Result Value Ref Range   Glucose-Capillary 212 (*) 70 - 99 mg/dL  GLUCOSE, CAPILLARY     Status: Abnormal   Collection Time    05/02/14 12:31 AM      Result Value Ref Range   Glucose-Capillary 146 (*) 70 - 99 mg/dL   Comment 1 Documented in Chart     Comment 2 Notify RN    GLUCOSE, CAPILLARY     Status: Abnormal   Collection Time    05/02/14  4:31 AM      Result Value Ref Range   Glucose-Capillary 141 (*) 70 - 99 mg/dL  GLUCOSE, CAPILLARY     Status: Abnormal   Collection Time    05/02/14  7:45 AM      Result Value Ref Range   Glucose-Capillary 131 (*) 70 - 99 mg/dL  GLUCOSE, CAPILLARY     Status: Abnormal   Collection Time    05/02/14 11:42 AM      Result Value Ref Range   Glucose-Capillary 193 (*) 70 - 99 mg/dL  GLUCOSE, CAPILLARY     Status: Abnormal   Collection Time    05/02/14  5:59 PM      Result Value Ref Range   Glucose-Capillary 198 (*)  70 - 99 mg/dL  GLUCOSE, CAPILLARY     Status: Abnormal   Collection Time    05/02/14  8:08 PM      Result Value Ref Range   Glucose-Capillary 157 (*) 70 - 99 mg/dL  GLUCOSE, CAPILLARY     Status: Abnormal   Collection Time    05/03/14 12:01 AM      Result Value Ref Range   Glucose-Capillary 484 (*) 70 - 99 mg/dL   Comment 1 Documented in Chart     Comment 2 Notify RN    GLUCOSE, CAPILLARY     Status: Abnormal   Collection Time    05/03/14 12:07 AM      Result Value Ref Range   Glucose-Capillary 171 (*) 70 - 99 mg/dL  GLUCOSE,  CAPILLARY     Status: Abnormal   Collection Time    05/03/14  3:33 AM      Result Value Ref Range   Glucose-Capillary 160 (*) 70 - 99 mg/dL  CBC     Status: Abnormal   Collection Time    05/03/14  7:00 AM      Result Value Ref Range   WBC 15.8 (*) 4.0 - 10.5 K/uL   RBC 3.54 (*) 4.22 - 5.81 MIL/uL   Hemoglobin 10.7 (*) 13.0 - 17.0 g/dL   HCT 31.2 (*) 39.0 - 52.0 %   MCV 88.1  78.0 - 100.0 fL   MCH 30.2  26.0 - 34.0 pg   MCHC 34.3  30.0 - 36.0 g/dL   RDW 13.3  11.5 - 15.5 %   Platelets 322  150 - 400 K/uL  BASIC METABOLIC PANEL     Status: Abnormal   Collection Time    05/03/14  7:00 AM      Result Value Ref Range   Sodium 137  137 - 147 mEq/L   Potassium 4.3  3.7 - 5.3 mEq/L   Chloride 102  96 - 112 mEq/L   CO2 24  19 - 32 mEq/L   Glucose, Bld 127 (*) 70 - 99 mg/dL   BUN 33 (*) 6 - 23 mg/dL   Creatinine, Ser 0.88  0.50 - 1.35 mg/dL   Calcium 8.7  8.4 - 10.5 mg/dL   GFR calc non Af Amer 86 (*) >90 mL/min   GFR calc Af Amer >90  >90 mL/min   Comment: (NOTE)     The eGFR has been calculated using the CKD EPI equation.     This calculation has not been validated in all clinical situations.     eGFR's persistently <90 mL/min signify possible Chronic Kidney     Disease.  GLUCOSE, CAPILLARY     Status: Abnormal   Collection Time    05/03/14  8:12 AM      Result Value Ref Range   Glucose-Capillary 139 (*) 70 - 99 mg/dL   No results found.     Medical Problem List and Plan: 1. 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  Order for protective helmet 2.  DVT Prophylaxis/Anticoagulation: SDH. Chronic Coumadin discontinued secondary to SDH 3. Pain Management: Tylenol as needed 4. History of alcohol tobacco abuse. Monitor for any signs of withdrawal. Provide counseling 5.  Neuropsych: This patient is capable of making decisions on his own behalf. 6. Seizure prophylaxis. Keppra 500 mg twice a day. Monitor for any  seizure activity 7. Atrial fibrillation. Chronic Coumadin discontinued. Continue Cardizem 60 mg 4 times a day, Coreg 25 mg twice a day 8. Pseudomonas UTI. Levaquin initiated 05/02/2014 9. History of CAD with CABG. Chest pain or shortness of breath. Continue to monitor 10. Hyperlipidemia. Lipitor 11. Hyperglycemia. Monitor blood sugars while on Decadron taper   Post Admission Physician Evaluation: 1. Functional deficits secondary  to right temporal contusions/subdural hematoma s/p eventual craniectomy 2. Patient is admitted to receive collaborative, interdisciplinary care between the physiatrist, rehab nursing staff, and therapy team. 3. Patient's level of medical complexity and substantial therapy needs in context of that medical necessity cannot be provided at a lesser intensity of care such as a SNF. 4. Patient has experienced substantial functional loss from his/her baseline which was documented above under the "Functional History" and "Functional Status" headings.  Judging by the patient's diagnosis, physical exam, and functional history, the patient has potential for functional progress which will result in measurable gains while on inpatient rehab.  These gains will be of substantial and practical use upon discharge  in facilitating mobility and self-care at the household level. 5. Physiatrist will provide 24 hour management of medical needs as well as oversight of the therapy plan/treatment and provide guidance as appropriate regarding the interaction of the two. 6. 24 hour rehab nursing will assist with bladder management, bowel management, safety, skin/wound care, disease management, medication administration, pain management and patient education  and help integrate therapy concepts, techniques,education, etc. 7. PT will assess and treat for/with:  Lower extremity strength, range of motion, stamina, balance, functional mobility, safety, adaptive techniques and equipment, NMR, craniectomy site protections, visual perceptual awareness, pain mgt, caregiver ed, egosupport, BI education.   Goals are: supervision to mod I. 8. OT will assess and treat for/with: ADL's, functional mobility, safety, upper extremity strength, adaptive techniques and equipment, NMR, fall-prevention education, manipulation of helmet, egosupport, brain injury education .   Goals are: mod I to supervision. 9. SLP will assess and treat for/with: cognition, communication.  Goals are: mod I to supervision. 10. Case Management and Social Worker will assess and treat for psychological issues and discharge planning. 11. Team conference will be held weekly to assess progress toward goals and to determine barriers to discharge. 12. Patient will receive at least 3 hours of therapy per day at least 5 days per week. 13. ELOS: 5-7 days       14. Prognosis:  excellent     Meredith Staggers, MD, [facility]   05/03/2014

## 2014-05-03 NOTE — Clinical Social Work Psychosocial (Signed)
Clinical Social Work Department BRIEF PSYCHOSOCIAL ASSESSMENT 05/03/2014  Patient:  Alejandro [name], Alejandro [name]     Account Number:  [id number]     Admit date:  04/19/2014  Clinical Social Worker:  Lovey Newcomer  Date/Time:  05/03/2014 10:45 AM  Referred by:  Physician  Date Referred:  05/03/2014 Referred for  SNF Placement   Other Referral:   Interview type:  Patient Other interview type:   Patient and wife interviewed at bedside to complete assessment.    PSYCHOSOCIAL DATA Living Status:  WIFE Admitted from facility:   Level of care:   Primary support name:  Alejandro [name] Primary support relationship to patient:  SPOUSE Degree of support available:   Support is strong.    CURRENT CONCERNS Current Concerns  Post-Acute Placement   Other Concerns:    SOCIAL WORK ASSESSMENT / PLAN CSW met with patient and wife Alejandro [name] at bedside to complete assessment. Patient is in agreement with recommendation for CIR or SNF at discharge. Patient reports that he lives with his wife at home and plans to return there after rehabilitation. Patient and wife are looking forward to admission to CIR. CSW explained that patient will need to consider at backup plan to CIR in the event he is not admitted to the program. Patient and wife are agreeable to this. CSW explained SNF search/placement process and answered their questions. Patient seemed in good spirits and both were very appreciative of CSW's visit.   Assessment/plan status:  Psychosocial Support/Ongoing Assessment of Needs Other assessment/ plan:   Complete FL2, Fax, PASRR   Information/referral to community resources:   CSW contact information and SNF list given to patient and wife.    PATIENT'S/FAMILY'S RESPONSE TO PLAN OF CARE: Patient and wife plan for patient to discharge to CIR but are agreeable to having a SNF backup plan in place in the event patient cannot be admitted to CIR. Patient and wife were engaged in assessment. CSW will assist  with DC if appropriate.       Liz Beach MSW, [place], [place], [phone]

## 2014-05-03 NOTE — Progress Notes (Signed)
Speech Language Pathology Treatment: Dysphagia;Cognitive-Linquistic  Patient Details Name: Alejandro [name] MRN: [id number] DOB: [birth year] Today's Date: 05/03/2014 Time: 3570-1779 SLP Time Calculation (min): 30 min  Assessment / Plan / Recommendation Clinical Impression  Pt demonstrates significant improvement in ability to sustain and alternate attention in a moderately distracting environment. Pt was able to set up meal with supervision assist and self feed appropriately, following 2 step commands with supervision.  Verbalizing awareness of situation and memory of recent events. Still mild circumlocutions at conversation level. No s/s of aspiration observed, pt masticated hard solids without dentition. Will upgrade diet to dys 3 (mechanical soft) with thin liquids, intermittent supervision. Cognitive function appears more consistent with a Rancho level VII (automatic/appropriate). Pt will benefit from ongoing SLP therapy to assist with higher level functional problem solving, awareness and memory. CIR would be very helpful.    HPI HPI: 68 yo with AF on Coumadin admitted after fall. Head CT revealed SDH with midline shift, and hemorrhagic contusion of the right temporal lobe. Underwent craniotomy and hematoma evacuation 5/18.  5/22 OR>> Reexploration of right pterional craniotomy for partial temporal lobectomy and evacuation of right temporal hematoma with implantation of the bone flap in the right lateral abdominal wall cavity, remained intubated post op from 5/22-5/27.  Pt pulled NG tube; clinical swallow eval ordered.     Pertinent Vitals NA  SLP Plan  Continue with current plan of care    Recommendations Diet recommendations: Dysphagia 3 (mechanical soft);Thin liquid Liquids provided via: Cup;Straw Medication Administration: Whole meds with puree Supervision: Intermittent supervision to cue for compensatory strategies;Patient able to self feed Compensations: Slow rate;Small sips/bites Postural  Changes and/or Swallow Maneuvers: Seated upright 90 degrees              General recommendations: Rehab consult Oral Care Recommendations: Oral care BID Follow up Recommendations: Inpatient Rehab Plan: Continue with current plan of care    GO    [facility], MA CCC-SLP [place] Kioni Stahl 05/03/2014, 9:53 AM

## 2014-05-03 NOTE — Progress Notes (Signed)
[facility] PROGRESS NOTE  ROMMEL [name] OVF:[id number] DOB: Sep 21, 1946 DOA: 04/19/2014 PCP: [name], MD   Brief narrative  68 year old male with history of a-fib on coumadin presented to the ED after a fall. In the ED a head CT was done that revealed SDH with midline shift, hemorrhagic contusion on the right temporal lobe and white matter disease. Neurosurgery was consulted, underwent craniotomy and hematoma evacuation on 5/18.Repeat CT head on 5/22 showed evolution of intraparenchymal hemorrhagic contusion or possibly venous infarction in the right temporal lobe with significant right temporal lobe swelling, subsequently underwent Reexploration of right pterional craniotomy for partial temporal lobectomy and evacuation of right temporal hematoma with implantation of the bone flap in the right lateral abdominal wall cavity. Patient remained extubated post op, and was subsequently extubated on 5/27.Transfered to [facility] on 5/31. Patient evaluated by CIR and [employer] authorization.  Assessment/Plan: Subdural hematoma/ICH  -traumatic, while on coumadin  -Underwent Craniotomy, hematoma evacuation on 5/18. On 5/22 repeat CT head showed evolution of intraparenchymal hemorrhagic contusion or possibly venous infarction in the right temporal lobe with significant right temporal lobe swelling. Taken  back to the OR on 5/22 for Reexploration of right pterional craniotomy for partial temporal lobectomy and evacuation of right temporal hematoma with implantation of the bone flap in the right lateral abdominal wall cavity.  -Currently awake and alert and no residual weakness. Taper decadron. -PT recommends CIR. Has been evaluated and pending insurance approval. -not a candidate for anticoagulation  any longer.  Acute Ventilator Dependent Resp Failure  -in setting ICH with worsening mental status and repeat OR for evolving hemorrhage  -extubated 5/27-   Atx/collapse L hemithorax  -resolved post  bscopy 5/23  -BAL 5/23 negative   Afib  -stable  -c/w Cardizem and Coreg  -not a candidate for anticoagulation anymore  HTN  -stable on  Cardizem and Coreg   Alcoholic Delirium  -resolved  Pseudomonal UTI  -on Levaquin-since 5/29 , complete on 6/4  Chronic Systolic CHF  -last Echo-EF 02/24/13-30-35 percent  -compensated   Disposition:  CIR once approved  DVT Prophylaxis:  SCD's   Code Status: full Family Communication: none at bedside Disposition Plan: CIR once approved   Consultants: Procedures:  LINES / TUBES:  ETT 5/22>>>5/27  Left radial 5/22>>>5/27  5/18 OR >>> Craniotomy, hematoma evacuation  5/22 OR>> Reexploration of right pterional craniotomy for partial temporal lobectomy and evacuation of right temporal hematoma with implantation of the bone flap in the right lateral abdominal wall cavity   CONSULTS:  pulmonary/intensive care   Neurosurgery,  CIR  antibiotics  Levaquin 5/29-6/4  HPI/Subjective: No overnight issues  Objective: Filed Vitals:   05/03/14 1401  BP: 116/67  Pulse: 94  Temp: 98.4 F (36.9 C)  Resp: 18    Intake/Output Summary (Last 24 hours) at 05/03/14 1601 Last data filed at 05/03/14 1142  Gross per 24 hour  Intake      0 ml  Output   1450 ml  Net  -1450 ml   Filed Weights   04/30/14 1511 05/01/14 0500 05/02/14 0330  Weight: 104.69 kg (230 lb 12.8 oz) 104.826 kg (231 lb 1.6 oz) 103.692 kg (228 lb 9.6 oz)    Exam:   General:  Elderly male in NAD  HEENT: right scalp staples, no pallor, Moist mucosa  Chest: clear b/l, no added sounds  CVS: S1&S2 irregular, no MRG  Abd: soft, NT, ND, BS+  Ext: warm, no edema  CNS: AAOX3  Data  Reviewed: Basic Metabolic Panel:  Recent Labs Lab 04/27/14 0500  04/28/14 0500 04/29/14 0308 04/30/14 0318 05/01/14 0604 05/03/14 0700  NA  --   < > 147 141 143 140 137  K  --   < > 4.0 4.0 3.8 4.0 4.3  CL  --   < > 110 102 103 104 102  CO2  --   < > 26 28 26 25 24    GLUCOSE  --   < > 115* 148* 158* 147* 127*  BUN  --   < > 27* 26* 36* 38* 33*  CREATININE  --   < > 1.13 0.97 0.95 0.95 0.88  CALCIUM  --   < > 8.4 9.1 9.5 9.1 8.7  MG  --   --   --   --  2.5  --   --   PHOS 2.7  --   --   --  3.5  --   --   < > = values in this interval not displayed. Liver Function Tests:  Recent Labs Lab 04/28/14 0500 04/29/14 0308  AST 45* 47*  ALT 30 36  ALKPHOS 72 84  BILITOT 0.5 0.8  PROT 5.8* 6.9  ALBUMIN 2.2* 2.5*   No results found for this basename: LIPASE, AMYLASE,  in the last 168 hours No results found for this basename: AMMONIA,  in the last 168 hours CBC:  Recent Labs Lab 04/27/14 0545 04/28/14 0500 04/29/14 0308 05/03/14 0700  WBC 9.8 9.1 12.5* 15.8*  NEUTROABS  --  7.0 11.6*  --   HGB 9.4* 8.5* 10.3* 10.7*  HCT 28.4* 25.5* 30.6* 31.2*  MCV 92.2 92.7 90.3 88.1  PLT 199 204 243 322   Cardiac Enzymes: No results found for this basename: CKTOTAL, CKMB, CKMBINDEX, TROPONINI,  in the last 168 hours BNP (last 3 results) No results found for this basename: PROBNP,  in the last 8760 hours CBG:  Recent Labs Lab 05/03/14 0001 05/03/14 0007 05/03/14 0333 05/03/14 0812 05/03/14 1204  GLUCAP 484* 171* 160* 139* 234*    Recent Results (from the past 240 hour(s))  CULTURE, BAL-QUANTITATIVE     Status: None   Collection Time    04/24/14  1:45 PM      Result Value Ref Range Status   Specimen Description BRONCHIAL ALVEOLAR LAVAGE   Final   Special Requests NONE   Final   Gram Stain     Final   Value: RARE WBC PRESENT,BOTH PMN AND MONONUCLEAR     NO SQUAMOUS EPITHELIAL CELLS SEEN     NO ORGANISMS SEEN     Performed at [facility]     Final   Value: 2,000 COLONIES/ML     Performed at [facility]   Culture     Final   Value: Non-Pathogenic Oropharyngeal-type Flora Isolated.     Performed at [facility]   Report Status 04/26/2014 FINAL   Final  CULTURE, BLOOD (ROUTINE X 2)     Status: None    Collection Time    04/27/14 11:22 AM      Result Value Ref Range Status   Specimen Description BLOOD RIGHT HAND   Final   Special Requests BOTTLES DRAWN AEROBIC AND ANAEROBIC 5CC   Final   Culture  Setup Time     Final   Value: 04/27/2014 16:26     Performed at [facility]     Final  Value: NO GROWTH 5 DAYS     Performed at [facility]   Report Status 05/03/2014 FINAL   Final  CULTURE, BLOOD (ROUTINE X 2)     Status: None   Collection Time    04/27/14 11:29 AM      Result Value Ref Range Status   Specimen Description BLOOD RIGHT HAND   Final   Special Requests     Final   Value: BOTTLES DRAWN AEROBIC AND ANAEROBIC 10CC AER 2CC ANA   Culture  Setup Time     Final   Value: 04/27/2014 16:26     Performed at [facility]   Culture     Final   Value: NO GROWTH 5 DAYS     Performed at [facility]   Report Status 05/03/2014 FINAL   Final  URINE CULTURE     Status: None   Collection Time    04/28/14 10:10 AM      Result Value Ref Range Status   Specimen Description URINE, RANDOM   Final   Special Requests NONE   Final   Culture  Setup Time     Final   Value: 04/28/2014 12:46     Performed at [facility]     Final   Value: >=100,000 COLONIES/ML     Performed at [facility]   Culture     Final   Value: PSEUDOMONAS AERUGINOSA     Performed at [facility]   Report Status 04/30/2014 FINAL   Final   Organism ID, Bacteria PSEUDOMONAS AERUGINOSA   Final  CSF CULTURE     Status: None   Collection Time    04/28/14 10:10 AM      Result Value Ref Range Status   Specimen Description CSF   Final   Special Requests 1.0ML CSF FLUID   Final   Gram Stain     Final   Value: MODERATE WBC PRESENT,BOTH PMN AND MONONUCLEAR     NO ORGANISMS SEEN     Performed at [facility]     Performed at [facility]   Culture     Final   Value: NO GROWTH 3 DAYS     Performed at [facility]    Report Status 05/01/2014 FINAL   Final  GRAM STAIN     Status: None   Collection Time    04/28/14 10:10 AM      Result Value Ref Range Status   Specimen Description CSF   Final   Special Requests 1.0ML CSF FLUID   Final   Gram Stain     Final   Value: DIRECT SMEAR     MODERATE WBC PRESENT,BOTH PMN AND MONONUCLEAR     NO ORGANISMS SEEN   Report Status 04/28/2014 FINAL   Final     Studies: No results found.  Scheduled Meds: . antiseptic oral rinse  15 mL Mouth Rinse q12n4p  . atorvastatin  20 mg Per Tube q1800  . carvedilol  25 mg Per Tube BID WC  . chlorhexidine  15 mL Mouth Rinse BID  . dexamethasone  4 mg Oral 3 times per day   Followed by  . [START ON 05/06/2014] dexamethasone  4 mg Oral Q12H   Followed by  . [START ON 05/09/2014] dexamethasone  2 mg Oral 3 times per day   Followed by  . [START ON 05/12/2014] dexamethasone  2 mg Oral Q12H  Followed by  . [START ON 05/15/2014] dexamethasone  2 mg Oral Daily  . diltiazem  60 mg Oral 4 times per day  . feeding supplement (ENSURE COMPLETE)  237 mL Oral BID BM  . folic acid  1 mg Oral Daily  . insulin aspart  0-9 Units Subcutaneous TID WC  . levETIRAcetam  500 mg Oral BID  . levofloxacin  750 mg Oral Daily  . multivitamin with minerals  1 tablet Oral Daily  . pantoprazole  40 mg Oral QHS  . thiamine  100 mg Oral Daily   Continuous Infusions:     Time spent: 25 minutes    Alicyn Klann  [facility] Pager [phone]. If 7PM-7AM, please contact night-coverage at [email or web address], password [email or web address] 05/03/2014, 4:01 PM  LOS: 14 days

## 2014-05-03 NOTE — Clinical Social Work Placement (Signed)
Clinical Social Work Department CLINICAL SOCIAL WORK PLACEMENT NOTE 05/03/2014  Patient:  Alejandro [name], Alejandro [name]  Account Number:  [id number] Admit date:  04/19/2014  Clinical Social Worker:  Lovey Newcomer  Date/time:  05/03/2014 10:45 AM  Clinical Social Work is seeking post-discharge placement for this patient at the following level of care:   SKILLED NURSING   (*CSW will update this form in Epic as items are completed)   05/03/2014  Patient/family provided with [place] Department of Clinical Social Work's list of facilities offering this level of care within the geographic area requested by the patient (or if unable, by the patient's family).  05/03/2014  Patient/family informed of their freedom to choose among providers that offer the needed level of care, that participate in Medicare, Medicaid or managed care program needed by the patient, have an available bed and are willing to accept the patient.  05/03/2014  Patient/family informed of [facility]' ownership interest in [facility], as well as of the fact that they are under no obligation to receive care at this facility.  PASARR submitted to EDS on 05/03/2014 PASARR number received from EDS on 05/03/2014  FL2 transmitted to all facilities in geographic area requested by pt/family on  05/03/2014 FL2 transmitted to all facilities within larger geographic area on   Patient informed that his/her managed care company has contracts with or will negotiate with  certain facilities, including the following:     Patient/family informed of bed offers received:  05/03/2014 Patient chooses bed at  Physician recommends and patient chooses bed at    Patient to be transferred to  on   Patient to be transferred to facility by   The following physician request were entered in Epic:   Additional Comments:   Liz Beach MSW, [place], [place], [phone]

## 2014-05-04 ENCOUNTER — Inpatient Hospital Stay (HOSPITAL_COMMUNITY)
Admission: RE | Admit: 2014-05-04 | Discharge: 2014-05-14 | DRG: 945 | Disposition: A | Discharge status: Home Health | Payer: BC Managed Care – PPO | Source: Intra-hospital | Attending: Physical Medicine & Rehabilitation | Admitting: Physical Medicine & Rehabilitation

## 2014-05-04 DIAGNOSIS — S065XAA Traumatic subdural hemorrhage with loss of consciousness status unknown, initial encounter: Secondary | ICD-10-CM

## 2014-05-04 DIAGNOSIS — Z8679 Personal history of other diseases of the circulatory system: Secondary | ICD-10-CM | POA: Diagnosis present

## 2014-05-04 DIAGNOSIS — I2510 Atherosclerotic heart disease of native coronary artery without angina pectoris: Secondary | ICD-10-CM | POA: Diagnosis present

## 2014-05-04 DIAGNOSIS — I5022 Chronic systolic (congestive) heart failure: Secondary | ICD-10-CM | POA: Diagnosis present

## 2014-05-04 DIAGNOSIS — S069X9A Unspecified intracranial injury with loss of consciousness of unspecified duration, initial encounter: Secondary | ICD-10-CM

## 2014-05-04 DIAGNOSIS — N390 Urinary tract infection, site not specified: Secondary | ICD-10-CM | POA: Diagnosis present

## 2014-05-04 DIAGNOSIS — B965 Pseudomonas (aeruginosa) (mallei) (pseudomallei) as the cause of diseases classified elsewhere: Secondary | ICD-10-CM | POA: Diagnosis present

## 2014-05-04 DIAGNOSIS — E236 Other disorders of pituitary gland: Secondary | ICD-10-CM | POA: Diagnosis present

## 2014-05-04 DIAGNOSIS — F1010 Alcohol abuse, uncomplicated: Secondary | ICD-10-CM | POA: Diagnosis present

## 2014-05-04 DIAGNOSIS — I4891 Unspecified atrial fibrillation: Secondary | ICD-10-CM

## 2014-05-04 DIAGNOSIS — S069XAA Unspecified intracranial injury with loss of consciousness status unknown, initial encounter: Secondary | ICD-10-CM

## 2014-05-04 DIAGNOSIS — Z8249 Family history of ischemic heart disease and other diseases of the circulatory system: Secondary | ICD-10-CM

## 2014-05-04 DIAGNOSIS — I10 Essential (primary) hypertension: Secondary | ICD-10-CM | POA: Diagnosis present

## 2014-05-04 DIAGNOSIS — F17200 Nicotine dependence, unspecified, uncomplicated: Secondary | ICD-10-CM | POA: Diagnosis present

## 2014-05-04 DIAGNOSIS — S065X9A Traumatic subdural hemorrhage with loss of consciousness of unspecified duration, initial encounter: Secondary | ICD-10-CM

## 2014-05-04 DIAGNOSIS — R7309 Other abnormal glucose: Secondary | ICD-10-CM | POA: Diagnosis present

## 2014-05-04 DIAGNOSIS — I5023 Acute on chronic systolic (congestive) heart failure: Secondary | ICD-10-CM

## 2014-05-04 DIAGNOSIS — Z7901 Long term (current) use of anticoagulants: Secondary | ICD-10-CM

## 2014-05-04 DIAGNOSIS — S0190XA Unspecified open wound of unspecified part of head, initial encounter: Secondary | ICD-10-CM | POA: Diagnosis present

## 2014-05-04 DIAGNOSIS — Z951 Presence of aortocoronary bypass graft: Secondary | ICD-10-CM

## 2014-05-04 DIAGNOSIS — S06339A Contusion and laceration of cerebrum, unspecified, with loss of consciousness of unspecified duration, initial encounter: Secondary | ICD-10-CM | POA: Diagnosis present

## 2014-05-04 DIAGNOSIS — Z5189 Encounter for other specified aftercare: Principal | ICD-10-CM

## 2014-05-04 DIAGNOSIS — E785 Hyperlipidemia, unspecified: Secondary | ICD-10-CM | POA: Diagnosis present

## 2014-05-04 DIAGNOSIS — Z833 Family history of diabetes mellitus: Secondary | ICD-10-CM

## 2014-05-04 DIAGNOSIS — S0633AA Contusion and laceration of cerebrum, unspecified, with loss of consciousness status unknown, initial encounter: Secondary | ICD-10-CM | POA: Diagnosis present

## 2014-05-04 DIAGNOSIS — W19XXXA Unspecified fall, initial encounter: Secondary | ICD-10-CM

## 2014-05-04 LAB — URINALYSIS, ROUTINE W REFLEX MICROSCOPIC
BILIRUBIN URINE: NEGATIVE
Glucose, UA: >1000 mg/dL — AB
KETONES UR: NEGATIVE mg/dL
Leukocytes, UA: NEGATIVE
NITRITE: NEGATIVE
Protein, ur: NEGATIVE mg/dL
SPECIFIC GRAVITY, URINE: 1.034 — AB (ref 1.005–1.030)
Urobilinogen, UA: 0.2 mg/dL (ref 0.0–1.0)
pH: 5 (ref 5.0–8.0)

## 2014-05-04 LAB — GLUCOSE, CAPILLARY
GLUCOSE-CAPILLARY: 148 mg/dL — AB (ref 70–99)
GLUCOSE-CAPILLARY: 270 mg/dL — AB (ref 70–99)
Glucose-Capillary: 219 mg/dL — ABNORMAL HIGH (ref 70–99)

## 2014-05-04 LAB — URINE MICROSCOPIC-ADD ON

## 2014-05-04 MED ORDER — ATORVASTATIN CALCIUM 20 MG PO TABS
20.0000 mg | ORAL_TABLET | Freq: Every day | ORAL | Status: DC
  Administered 2014-05-04 – 2014-05-13 (×10): 20 mg via ORAL
  Filled 2014-05-04 (×11): qty 1

## 2014-05-04 MED ORDER — DEXAMETHASONE 2 MG PO TABS: 2.0000 mg | ORAL_TABLET | Freq: Every day | ORAL | Status: DC

## 2014-05-04 MED ORDER — DEXAMETHASONE 4 MG PO TABS
4.0000 mg | ORAL_TABLET | Freq: Three times a day (TID) | ORAL | Status: AC
  Administered 2014-05-04 – 2014-05-07 (×8): 4 mg via ORAL
  Filled 2014-05-04 (×8): qty 1

## 2014-05-04 MED ORDER — DEXAMETHASONE 2 MG PO TABS: 2.0000 mg | ORAL_TABLET | Freq: Two times a day (BID) | ORAL | Status: DC

## 2014-05-04 MED ORDER — DEXAMETHASONE 2 MG PO TABS: 2.0000 mg | ORAL_TABLET | Freq: Three times a day (TID) | ORAL | Status: DC

## 2014-05-04 MED ORDER — BIOTENE DRY MOUTH MT LIQD
15.0000 mL | Freq: Two times a day (BID) | OROMUCOSAL | Status: DC
  Administered 2014-05-05 – 2014-05-13 (×16): 15 mL via OROMUCOSAL

## 2014-05-04 MED ORDER — ACETAMINOPHEN 325 MG PO TABS
325.0000 mg | ORAL_TABLET | ORAL | Status: DC | PRN
  Administered 2014-05-05: 650 mg via ORAL
  Filled 2014-05-04: qty 2

## 2014-05-04 MED ORDER — IPRATROPIUM-ALBUTEROL 0.5-2.5 (3) MG/3ML IN SOLN: 3.0000 mL | Freq: Four times a day (QID) | RESPIRATORY_TRACT | Status: DC | PRN

## 2014-05-04 MED ORDER — DILTIAZEM HCL 60 MG PO TABS
60.0000 mg | ORAL_TABLET | Freq: Four times a day (QID) | ORAL | Status: DC
  Administered 2014-05-04 – 2014-05-14 (×39): 60 mg via ORAL
  Filled 2014-05-04 (×43): qty 1

## 2014-05-04 MED ORDER — ENSURE COMPLETE PO LIQD
237.0000 mL | Freq: Two times a day (BID) | ORAL | Status: DC
  Administered 2014-05-05 – 2014-05-14 (×16): 237 mL via ORAL

## 2014-05-04 MED ORDER — LEVETIRACETAM 100 MG/ML PO SOLN
500.0000 mg | Freq: Two times a day (BID) | ORAL | Status: DC
  Administered 2014-05-04 – 2014-05-13 (×19): 500 mg via ORAL
  Filled 2014-05-04 (×23): qty 5

## 2014-05-04 MED ORDER — DEXAMETHASONE 2 MG PO TABS
2.0000 mg | ORAL_TABLET | Freq: Two times a day (BID) | ORAL | Status: DC
  Administered 2014-05-13 – 2014-05-14 (×2): 2 mg via ORAL
  Filled 2014-05-04 (×4): qty 1

## 2014-05-04 MED ORDER — DEXAMETHASONE 4 MG PO TABS: 4.0000 mg | ORAL_TABLET | Freq: Three times a day (TID) | ORAL | Status: DC

## 2014-05-04 MED ORDER — PANTOPRAZOLE SODIUM 40 MG PO TBEC
40.0000 mg | DELAYED_RELEASE_TABLET | Freq: Every day | ORAL | Status: DC
  Administered 2014-05-04 – 2014-05-13 (×10): 40 mg via ORAL
  Filled 2014-05-04 (×10): qty 1

## 2014-05-04 MED ORDER — ONDANSETRON HCL 4 MG PO TABS: 4.0000 mg | ORAL_TABLET | Freq: Four times a day (QID) | ORAL | Status: DC | PRN

## 2014-05-04 MED ORDER — LEVOFLOXACIN 750 MG PO TABS
750.0000 mg | ORAL_TABLET | Freq: Every day | ORAL | Status: AC
  Administered 2014-05-05 – 2014-05-08 (×4): 750 mg via ORAL
  Filled 2014-05-04 (×4): qty 1

## 2014-05-04 MED ORDER — ADULT MULTIVITAMIN W/MINERALS CH
1.0000 | ORAL_TABLET | Freq: Every day | ORAL | Status: DC
  Administered 2014-05-05 – 2014-05-14 (×10): 1 via ORAL
  Filled 2014-05-04 (×11): qty 1

## 2014-05-04 MED ORDER — DEXAMETHASONE 4 MG PO TABS: 4.0000 mg | ORAL_TABLET | Freq: Two times a day (BID) | ORAL | Status: DC

## 2014-05-04 MED ORDER — ONDANSETRON HCL 4 MG/2ML IJ SOLN: 4.0000 mg | Freq: Four times a day (QID) | INTRAMUSCULAR | Status: DC | PRN

## 2014-05-04 MED ORDER — FOLIC ACID 1 MG PO TABS
1.0000 mg | ORAL_TABLET | Freq: Every day | ORAL | Status: DC
  Administered 2014-05-05 – 2014-05-14 (×10): 1 mg via ORAL
  Filled 2014-05-04 (×11): qty 1

## 2014-05-04 MED ORDER — DEXAMETHASONE 2 MG PO TABS
2.0000 mg | ORAL_TABLET | Freq: Three times a day (TID) | ORAL | Status: AC
  Administered 2014-05-10 – 2014-05-13 (×9): 2 mg via ORAL
  Filled 2014-05-04 (×10): qty 1

## 2014-05-04 MED ORDER — DILTIAZEM HCL ER COATED BEADS 180 MG PO CP24: 180.0000 mg | ORAL_CAPSULE | Freq: Every day | ORAL | Status: DC

## 2014-05-04 MED ORDER — LEVETIRACETAM 100 MG/ML PO SOLN: 500.0000 mg | Freq: Two times a day (BID) | ORAL | Status: DC

## 2014-05-04 MED ORDER — PANTOPRAZOLE SODIUM 40 MG PO TBEC: 40.0000 mg | DELAYED_RELEASE_TABLET | Freq: Every day | ORAL | Status: DC

## 2014-05-04 MED ORDER — SORBITOL 70 % SOLN: 30.0000 mL | Freq: Every day | Status: DC | PRN

## 2014-05-04 MED ORDER — VITAMIN B-1 100 MG PO TABS
100.0000 mg | ORAL_TABLET | Freq: Every day | ORAL | Status: DC
  Administered 2014-05-05 – 2014-05-14 (×10): 100 mg via ORAL
  Filled 2014-05-04 (×11): qty 1

## 2014-05-04 MED ORDER — INSULIN ASPART 100 UNIT/ML ~~LOC~~ SOLN
0.0000 [IU] | Freq: Three times a day (TID) | SUBCUTANEOUS | Status: DC
  Administered 2014-05-04: 5 [IU] via SUBCUTANEOUS
  Administered 2014-05-05: 2 [IU] via SUBCUTANEOUS
  Administered 2014-05-05 – 2014-05-06 (×3): 1 [IU] via SUBCUTANEOUS
  Administered 2014-05-06: 2 [IU] via SUBCUTANEOUS
  Administered 2014-05-06: 3 [IU] via SUBCUTANEOUS
  Administered 2014-05-07: 1 [IU] via SUBCUTANEOUS
  Administered 2014-05-07: 2 [IU] via SUBCUTANEOUS
  Administered 2014-05-08 (×2): 1 [IU] via SUBCUTANEOUS
  Administered 2014-05-08: 2 [IU] via SUBCUTANEOUS
  Administered 2014-05-09: 3 [IU] via SUBCUTANEOUS
  Administered 2014-05-09: 2 [IU] via SUBCUTANEOUS
  Administered 2014-05-10 – 2014-05-11 (×3): 1 [IU] via SUBCUTANEOUS
  Administered 2014-05-11: 2 [IU] via SUBCUTANEOUS
  Administered 2014-05-11: 1 [IU] via SUBCUTANEOUS
  Administered 2014-05-12: 2 [IU] via SUBCUTANEOUS
  Administered 2014-05-12: 1 [IU] via SUBCUTANEOUS
  Administered 2014-05-12: 3 [IU] via SUBCUTANEOUS
  Administered 2014-05-13: 1 [IU] via SUBCUTANEOUS
  Administered 2014-05-13: 2 [IU] via SUBCUTANEOUS
  Administered 2014-05-13: 3 [IU] via SUBCUTANEOUS

## 2014-05-04 MED ORDER — CARVEDILOL 25 MG PO TABS
25.0000 mg | ORAL_TABLET | Freq: Two times a day (BID) | ORAL | Status: DC
  Administered 2014-05-05 – 2014-05-14 (×19): 25 mg via ORAL
  Filled 2014-05-04 (×21): qty 1

## 2014-05-04 MED ORDER — DEXAMETHASONE 4 MG PO TABS
4.0000 mg | ORAL_TABLET | Freq: Two times a day (BID) | ORAL | Status: AC
  Administered 2014-05-07 – 2014-05-10 (×6): 4 mg via ORAL
  Filled 2014-05-04 (×6): qty 1

## 2014-05-04 MED ORDER — LEVOFLOXACIN 750 MG PO TABS: 750.0000 mg | ORAL_TABLET | Freq: Every day | ORAL | Status: DC

## 2014-05-04 NOTE — Progress Notes (Signed)
Rehab admissions - We did receive insurance authorization from [facility] for inpatient rehab. Bed is available and will admit pt later today.  I updated pt and spoke with his wife by phone. Both were pleased that we got insurance approval.  I updated Neoma Laming, case Freight forwarder and Marshell Levan, Education officer, museum as well. Rn also aware.  Please call me with any questions. Thanks.  Nanetta Batty, PT Rehabilitation Admissions Coordinator [phone]

## 2014-05-04 NOTE — Progress Notes (Signed)
Patient ID: Alejandro [name], male   DOB: 1946/05/13, 68 y.o.   MRN: [id number] Patient admitted to [phone] via bed, escorted by nursing staff and family.  Patient and family verbalized understanding of rehab process, safety plan signed.  Patient appears to be in no immediate distress at this time.  Will continue plan of care.  Brita Romp, RN

## 2014-05-04 NOTE — Clinical Social Work Note (Signed)
Per CIR rehab admissions coordinator, CIR has received authorization for CIR admission.   Liz Beach MSW, [place], [place], [phone]

## 2014-05-04 NOTE — Care Management Note (Signed)
    Page 1 of 1   05/04/2014     5:49:47 PM CARE MANAGEMENT NOTE 05/04/2014  Patient:  KORVER, GRAYBEAL   Account Number:  [id number]  Date Initiated:  05/04/2014  Documentation initiated by:  Tomi Bamberger  Subjective/Objective Assessment:   DX subdural hematoma  admit- lives with spouse.     Action/Plan:   pt eval- rec cir.   Anticipated DC Date:  05/04/2014   Anticipated DC Plan:  IP REHAB FACILITY  In-house referral  Clinical Social Worker      DC Planning Services  CM consult      PAC Choice  IP REHAB   Choice offered to / List presented to:  C-1 Patient           Status of service:  Completed, signed off Medicare Important Message given?   (If response is "NO", the following Medicare IM given date fields will be blank) Date Medicare IM given:   Date Additional Medicare IM given:    Discharge Disposition:  IP REHAB FACILITY  Per UR Regulation:  Reviewed for med. necessity/level of care/duration of stay  If discussed at [place] of Stay Meetings, dates discussed:    Comments:

## 2014-05-04 NOTE — Progress Notes (Signed)
Rehab admissions - I continue to follow pt's case and have called [employer] last evening and this am to check on insurance authorization. Two voicemails left and have not heard back from RN at [facility].   I also spoke with Neoma Laming, case manager about pt's case and that we are still waiting on insurance authorization.  I will continue to keep the pt/family and medical team aware of any updates. Thanks.  Nanetta Batty, PT Rehabilitation Admissions Coordinator [phone]

## 2014-05-04 NOTE — Progress Notes (Signed)
Speech Language Pathology Treatment: Dysphagia;Cognitive-Linquistic  Patient Details Name: Alejandro [name] MRN: [id number] DOB: March 27, 1946 Today's Date: 05/04/2014 Time: 4098-1191 SLP Time Calculation (min): 30 min  Assessment / Plan / Recommendation Clinical Impression  Pt continues to make demonstrable gains in cognition.  Today, able to alternate attention between written stimulus and interruptions from staff with min cues needed to return attention to target task. Verbalized awareness of situation and potential D/C to CIR today;  Oriented to time and person; min cues for place.  Still with mild circumlocutions at conversation level. Read newspaper article with mod assist to retain facts; tended to intertwine facts from one story with the next.    Swallowing is much improved - No s/s of aspiration observed, pt masticated hard solids without dentition.  Recommend advancing to regular solids after transitioning to CIR.  Cognitive function appears more consistent with a Rancho level VII (automatic/appropriate). Marland Kitchen     HPI HPI: 68 yo with AF on Coumadin admitted after fall. Head CT revealed SDH with midline shift, and hemorrhagic contusion of the right temporal lobe. Underwent craniotomy and hematoma evacuation 5/18.  5/22 OR>> Reexploration of right pterional craniotomy for partial temporal lobectomy and evacuation of right temporal hematoma with implantation of the bone flap in the right lateral abdominal wall cavity, remained intubated post op from 5/22-5/27.  Pt pulled NG tube; clinical swallow eval ordered.        SLP Plan  Continue with current plan of care    Recommendations Diet recommendations: Regular;Thin liquid Medication Administration: Whole meds with liquid              Oral Care Recommendations: Oral care BID Follow up Recommendations: Inpatient Rehab Plan: Continue with current plan of care    Alejandro [name], [place] CCC/SLP Pager [phone]      Assunta Curtis 05/04/2014, 2:29 PM

## 2014-05-04 NOTE — Progress Notes (Signed)
NURSING PROGRESS NOTE  DASHAUN ONSTOTT [id number] Discharge Data: 05/04/2014 4:45 PM Attending Provider: Louellen Molder, MD DUK:GURK Paz, MD     [name] to be D/C'd Rehab per MD order.  Report called to 4W.  All IV's discontinued with no bleeding noted. All belongings transferred with  patient.   Last Vital Signs:  Blood pressure 133/69, pulse 75, temperature 97.7 F (36.5 C), temperature source Oral, resp. rate 18, height 5\' 8"  (1.727 m), weight 104.282 kg (229 lb 14.4 oz), SpO2 91.00%.  Discharge Medication List   Medication List    STOP taking these medications       warfarin 2 MG tablet  Commonly known as:  COUMADIN      TAKE these medications       carvedilol 25 MG tablet  Commonly known as:  COREG  Take 1 tablet (25 mg total) by mouth 2 (two) times daily with a meal.     dexamethasone 4 MG tablet  Commonly known as:  DECADRON  Take 1 tablet (4 mg total) by mouth every 8 (eight) hours.     dexamethasone 4 MG tablet  Commonly known as:  DECADRON  Take 1 tablet (4 mg total) by mouth every 12 (twelve) hours.  Start taking on:  05/06/2014     dexamethasone 2 MG tablet  Commonly known as:  DECADRON  Take 1 tablet (2 mg total) by mouth every 8 (eight) hours.  Start taking on:  05/09/2014     dexamethasone 2 MG tablet  Commonly known as:  DECADRON  Take 1 tablet (2 mg total) by mouth every 12 (twelve) hours.  Start taking on:  05/12/2014     dexamethasone 2 MG tablet  Commonly known as:  DECADRON  Take 1 tablet (2 mg total) by mouth daily.  Start taking on:  05/15/2014     diltiazem 180 MG 24 hr capsule  Commonly known as:  CARDIZEM CD  Take 1 capsule (180 mg total) by mouth daily.     furosemide 80 MG tablet  Commonly known as:  LASIX  Take 0.5 tablets (40 mg total) by mouth daily.     ibuprofen 200 MG tablet  Commonly known as:  ADVIL,MOTRIN  Take 200 mg by mouth every 6 (six) hours as needed for pain.     levETIRAcetam 100 MG/ML solution  Commonly known  as:  KEPPRA  Take 5 mLs (500 mg total) by mouth 2 (two) times daily.     levofloxacin 750 MG tablet  Commonly known as:  LEVAQUIN  Take 1 tablet (750 mg total) by mouth daily.     lisinopril 20 MG tablet  Commonly known as:  PRINIVIL,ZESTRIL  TAKE 1 TABLET EVERY DAY     pantoprazole 40 MG tablet  Commonly known as:  PROTONIX  Take 1 tablet (40 mg total) by mouth at bedtime.     simvastatin 40 MG tablet  Commonly known as:  ZOCOR  Take 40 mg by mouth every evening.

## 2014-05-04 NOTE — Progress Notes (Signed)
Physical Therapy Treatment Patient Details Name: Alejandro [name] MRN: [id number] DOB: 09-01-1946 Today's Date: 05/04/2014    History of Present Illness History of present illness: Patient is a 68 year old gentleman who underwent craniotomy for evacuation of a right acute subdural hematoma on the night patient initially did very well however then had to be sedated for presumptive alcohol withdrawal symptoms. Initial followup CT scan showed excellent evacuation of the subdural hematoma in stable small right temporal contusion followup CT scan 40 hours later showed expansion of his right temporal intracerebral hematoma and possible venous infarction with temporal lobe swelling and possible impending uncal herniation. 11/22  Reexploration of right pterional craniotomy for partial temporal lobectomy and evacuation of right temporal hematoma with implantation of the bone flap in the right lateral abdominal wall cavity. Extubated 5/27.    PT Comments    Pt progressing with mobility and balance. Continue to need post  Acute rehab, CIR.  Follow Up Recommendations  CIR     Equipment Recommendations  None recommended by PT    Recommendations for Other Services Rehab consult     Precautions / Restrictions Precautions Precautions: Fall Restrictions Weight Bearing Restrictions: No    Mobility  Bed Mobility Overal bed mobility: Needs Assistance Bed Mobility: Supine to Sit     Supine to sit: Min assist     General bed mobility comments: use of rail and min assist to pull up with 1 UE  Transfers Overall transfer level: Needs assistance Equipment used: Rolling walker (2 wheeled) Transfers: Sit to/from Stand Sit to Stand: Min assist;Mod assist         General transfer comment: mod assist from low toilet, cues for UE use and rail.  Ambulation/Gait Ambulation/Gait assistance: Min assist Ambulation Distance (Feet): 75 Feet Assistive device: Rolling walker (2 wheeled) Gait  Pattern/deviations: Step-to pattern;Step-through pattern;Staggering left;Staggering right;Decreased weight shift to right;Decreased weight shift to left Gait velocity: slow   General Gait Details: more steady assist to turn  around, side step and maneuvre around obstacles.   Stairs            Wheelchair Mobility    Modified Rankin (Stroke Patients Only)       Balance     Sitting balance-Leahy Scale: Fair Sitting balance - Comments: sitting is static   Standing balance support: Bilateral upper extremity supported Standing balance-Leahy Scale: Poor                      Cognition Arousal/Alertness: Awake/alert Behavior During Therapy: WFL for tasks assessed/performed Overall Cognitive Status: Impaired/Different from baseline     Current Attention Level: Selective Memory: Decreased short-term memory Following Commands: Follows one step commands consistently Safety/Judgement: Decreased awareness of safety;Decreased awareness of deficits          Exercises      General Comments        Pertinent Vitals/Pain No c/o    Home Living                      Prior Function            PT Goals (current goals can now be found in the care plan section) Progress towards PT goals: Progressing toward goals    Frequency  Min 4X/week    PT Plan Current plan remains appropriate    Co-evaluation             End of Session Equipment Utilized During Treatment: Gait belt Activity Tolerance: Patient  tolerated treatment well Patient left: in chair;with call bell/phone within reach;with chair alarm set     Time: 1601-0932 PT Time Calculation (min): 20 min  Charges:  $Gait Training: 8-22 mins                    G Codes:      Claretha Cooper 05/04/2014, 11:13 AM Tresa Endo PT [phone]

## 2014-05-04 NOTE — H&P (View-Only) (Signed)
Physical Medicine and Rehabilitation Admission H&P    Chief Complaint  Patient presents with  . Fall  . on blood thinners   : HPI: Alejandro [name] is a 68 y.o.right handed male with A fib on chronic coumadin,CAD with CABG, alcohol and tobacco abuse. Admitted 04/19/2014 after noted fall with laceration on his head. Cranial CT scan showed subdural hematoma with midline shift, hemorrhagic contusion on the right temporal lobe as well as right matter disease. INR on admission of 2.5 and reversed. Underwent right craniotomy for evacuation of acute subdural hematoma 04/19/2014 per Dr. Saintclair Halsted. Maintained on [place] for seizure prophylaxis. Initial postoperative CT scan stable with again repeat serial cranial CT scan 04/23/2014 showing expansion of right temporal intracerebral hematoma and possible venous infarction with temporal lobe swelling. Underwent reexploration of right craniotomy for partial temporal lobectomy and evacuation of right temporal hematoma with implantation of a bone flap in the right lateral abdominal wall cavity 04/23/2014. Patient remained intubated until 04/28/2014 and follow up per critical care services. Noted wound with some clear drainage out of one area posterior aspect of incision suspicious for CSF that has since resolved . Maintained on steroid protocol with taper. Wound culture Gram stain no organisms seen. A urine study 04/28/2014 greater than 100,000 Pseudomonas maintained on Levaquin. . Currently on mechanical soft thin liquid diet. Followup physical and occupational therapy evaluations with recommendations for physical medicine rehabilitation consult. Patient was admitted for comprehensive rehabilitation program.   ROS Review of Systems  Cardiovascular: Positive for palpitations.  Musculoskeletal: Positive for falls and myalgias.  All other systems reviewed and are negative  Past Medical History  Diagnosis Date  . Systolic heart failure   . CAD (coronary artery  disease)     s/p CABGx4 on 12/24/2008  . Dyslipidemia   . HTN (hypertension)   . Atrial fibrillation   . Cancer     around nose  . Tobacco abuse   . Alcohol abuse   . H/O hiatal hernia    Past Surgical History  Procedure Laterality Date  . Coronary artery bypass graft  12/24/2008    4 vessel  . Umbilical hernia repair  2010  . Hernia repair  2012  . Coronary artery bypass graft  2012  . Ventral hernia repair N/A 01/22/2013    Procedure: HERNIA REPAIR VENTRAL ADULT;  Surgeon: [name], MD;  Location: [facility];  Service: General;  Laterality: N/A;  . Application of a-cell of chest/abdomen N/A 01/22/2013    Procedure: APPLICATION OF A-CELL OF CHEST/ABDOMEN;  Surgeon: [name], MD;  Location: [facility];  Service: General;  Laterality: N/A;  . Cystoscopy N/A 01/22/2013    Procedure: Consuela Mimes;  Surgeon: [name], MD;  Location: [facility];  Service: Urology;  Laterality: N/A;  Cystoscopy with balloon dilation. Insertion of coude catheter.  . Craniotomy Right 04/19/2014    Procedure: CRANIOTOMY HEMATOMA EVACUATION SUBDURAL;  Surgeon: [name], MD;  Location: [facility];  Service: Neurosurgery;  Laterality: Right;  right  . Craniotomy Right 04/23/2014    Procedure: Craniotomy for Intracerebral Hemorrhage;  Surgeon: [name], MD;  Location: [facility];  Service: Neurosurgery;  Laterality: Right;   Family History  Problem Relation Age of Onset  . Diabetes Mother   . Heart disease Mother   . Diabetes Father   . Heart disease Father   . Heart disease Brother     s/p CABG at 15  . Colon cancer  Neg Hx   . Prostate cancer Neg Hx    Social History:  reports that he has been smoking Cigarettes.  He has a 50 pack-year smoking history. He has never used smokeless tobacco. He reports that he drinks about 6 ounces of alcohol per week. He reports that he does not use illicit drugs. Allergies: No Known Allergies Medications Prior to Admission  Medication Sig Dispense Refill  .  carvedilol (COREG) 25 MG tablet Take 1 tablet (25 mg total) by mouth 2 (two) times daily with a meal.  60 tablet  3  . furosemide (LASIX) 80 MG tablet Take 0.5 tablets (40 mg total) by mouth daily.  45 tablet  1  . ibuprofen (ADVIL,MOTRIN) 200 MG tablet Take 200 mg by mouth every 6 (six) hours as needed for pain.       Marland Kitchen lisinopril (PRINIVIL,ZESTRIL) 20 MG tablet TAKE 1 TABLET EVERY DAY  90 tablet  1  . simvastatin (ZOCOR) 40 MG tablet Take 40 mg by mouth every evening.      . warfarin (COUMADIN) 2 MG tablet Take 2-4 mg by mouth daily. Takes 42m every day but Monday and takes 422mon monday        Home: [place] to be discharged to:: Inpatient rehab Living Arrangements: Spouse/significant other Additional Comments: pt was indep and worked full time  Lives With: Spouse   Functional History: Prior Function Level of Independence: Independent Comments: Pt worked as an elPharmacist, hospitaloing inHealth and safety inspectortatus:  Mobility: BeMetaed mobility: Needs Assistance Bed Mobility: Supine to Sit Supine to sit: Mod assist General bed mobility comments: Vcs for hand placement, assist to elevate to EOB, initial assist for balance Transfers Overall transfer level: Needs assistance Equipment used: Rolling walker (2 wheeled);2 person hand held assist Transfers: Sit to/from [employer] to Stand: Mod assist Stand pivot transfers: Mod assist General transfer comment: Heavy reliance on RW, assist to come to standing position, VCs for hand placement and safety with use of RW Ambulation/Gait Ambulation/Gait assistance: Mod assist Ambulation Distance (Feet): 6 Feet Assistive device: Rolling walker (2 wheeled) Gait Pattern/deviations: Step-through pattern;Decreased stride length;Shuffle;Narrow base of support;Trunk flexed;Drifts right/left Gait velocity: slow General Gait Details: pivotal steps to chair     ADL: ADL Overall ADL's : Needs assistance/impaired Eating/Feeding: Set up;Supervision/ safety;Sitting Grooming: Set up;Supervision/safety;Cueing for safety;Cueing for sequencing Grooming Details (indicate cue type and reason): Applied deoderant to his entire upper body Upper Body Bathing: Minimal assitance Lower Body Bathing: Moderate assistance;Sit to/from stand Upper Body Dressing : Moderate assistance;Sitting Lower Body Dressing: Moderate assistance;Sit to/from stand Lower Body Dressing Details (indicate cue type and reason): able to don/doff socks in sitting with supervision.  Requires assist with balance and pulling pants over hips  Toilet Transfer: Moderate assistance;Stand-pivot;BSC Toileting- Clothing Manipulation and Hygiene: Moderate assistance Toileting - Clothing Manipulation Details (indicate cue type and reason): foley Functional mobility during ADLs: Moderate assistance General ADL Comments: Functional mobility and ADL affected by general weakness and cognitive deficits.  Cognition: Cognition Overall Cognitive Status: Impaired/Different from baseline Arousal/Alertness: Awake/alert Orientation Level: Oriented X4 Attention: Sustained Sustained Attention: Impaired Sustained Attention Impairment: Verbal basic;Functional basic Memory: Impaired Memory Impairment: Storage deficit;Retrieval deficit;Decreased short term memory Decreased Short Term Memory: Verbal basic;Functional basic Awareness: Impaired Awareness Impairment: Intellectual impairment Problem Solving: Impaired Executive Function: Reasoning;Organizing;Decision Making Reasoning: Impaired Reasoning Impairment: Verbal basic Organizing: Impaired Organizing Impairment: Verbal basic Decision Making: Impaired Decision Making Impairment: Verbal basic Safety/Judgment: Impaired [place]  [employer] Scales of Cognitive Functioning: Automatic/appropriate (with emerging VIII behaviors. ) Cognition Arousal/Alertness:  Awake/alert Behavior During Therapy: Flat affect Overall Cognitive Status: Impaired/Different from baseline Area of Impairment: Orientation;Attention;Memory;Following commands;Safety/judgement;Awareness;Problem solving Orientation Level: Disoriented to;Time Current Attention Level: Sustained Memory: Decreased recall of precautions;Decreased short-term memory Following Commands: Follows one step commands consistently Safety/Judgement: Decreased awareness of safety;Decreased awareness of deficits Awareness: Emergent Problem Solving: Slow processing;Decreased initiation;Difficulty sequencing;Requires verbal cues;Requires tactile cues General Comments: Distracted dueint eval by tele wires. Impulsive. confabulating  Physical Exam: Blood pressure 146/84, pulse 101, temperature 97.9 F (36.6 C), temperature source Oral, resp. rate 18, height _0  (1.727 m), weight 103.692 kg (228 lb 9.6 oz), SpO2 95.00%. Physical Exam Sitting in chair. No distress. obese Eyes:  Pupils round and reactive to light  Mouth: edentulous, mucosa pink and moist Head: craniectomy site sunken in. No drainage. Wound well approximated  Neck: Normal range of motion. Neck supple. No thyromegaly present.  Cardiovascular:  Cardiac rate controlled without murmur Respiratory: Effort normal and breath sounds normal. No respiratory distress.  GI: Soft. Bowel sounds are normal. He exhibits no distension.  Flap site dressed/intact. Minimally tender Neurological: He is alert.  Makes good eye contact with examiner.  Far left upper quadrant of vision may be affected. No neglect. appropriate for name, place, age. Bilat mittens in place.Followed simple commands. Moves all 4 limbs with ease.   Can follow simple commands. Improved insight and awareness. Recalls most biographical information. Non-impulsive. Sensory exam intact. dtr's 1+.  Skin:  Intact outside surgical sites Psych: pleasant, cooperative, nonimpulsive,  non-anxious  Results for orders placed during the hospital encounter of 04/19/14 (from the past 48 hour(s))  GLUCOSE, CAPILLARY     Status: Abnormal   Collection Time    05/01/14 12:08 PM      Result Value Ref Range   Glucose-Capillary 190 (*) 70 - 99 mg/dL  GLUCOSE, CAPILLARY     Status: Abnormal   Collection Time    05/01/14  3:56 PM      Result Value Ref Range   Glucose-Capillary 164 (*) 70 - 99 mg/dL  GLUCOSE, CAPILLARY     Status: Abnormal   Collection Time    05/01/14  8:11 PM      Result Value Ref Range   Glucose-Capillary 212 (*) 70 - 99 mg/dL  GLUCOSE, CAPILLARY     Status: Abnormal   Collection Time    05/02/14 12:31 AM      Result Value Ref Range   Glucose-Capillary 146 (*) 70 - 99 mg/dL   Comment 1 Documented in Chart     Comment 2 Notify RN    GLUCOSE, CAPILLARY     Status: Abnormal   Collection Time    05/02/14  4:31 AM      Result Value Ref Range   Glucose-Capillary 141 (*) 70 - 99 mg/dL  GLUCOSE, CAPILLARY     Status: Abnormal   Collection Time    05/02/14  7:45 AM      Result Value Ref Range   Glucose-Capillary 131 (*) 70 - 99 mg/dL  GLUCOSE, CAPILLARY     Status: Abnormal   Collection Time    05/02/14 11:42 AM      Result Value Ref Range   Glucose-Capillary 193 (*) 70 - 99 mg/dL  GLUCOSE, CAPILLARY     Status: Abnormal   Collection Time    05/02/14  5:59 PM      Result Value Ref Range   Glucose-Capillary 198 (*)  70 - 99 mg/dL  GLUCOSE, CAPILLARY     Status: Abnormal   Collection Time    05/02/14  8:08 PM      Result Value Ref Range   Glucose-Capillary 157 (*) 70 - 99 mg/dL  GLUCOSE, CAPILLARY     Status: Abnormal   Collection Time    05/03/14 12:01 AM      Result Value Ref Range   Glucose-Capillary 484 (*) 70 - 99 mg/dL   Comment 1 Documented in Chart     Comment 2 Notify RN    GLUCOSE, CAPILLARY     Status: Abnormal   Collection Time    05/03/14 12:07 AM      Result Value Ref Range   Glucose-Capillary 171 (*) 70 - 99 mg/dL  GLUCOSE,  CAPILLARY     Status: Abnormal   Collection Time    05/03/14  3:33 AM      Result Value Ref Range   Glucose-Capillary 160 (*) 70 - 99 mg/dL  CBC     Status: Abnormal   Collection Time    05/03/14  7:00 AM      Result Value Ref Range   WBC 15.8 (*) 4.0 - 10.5 K/uL   RBC 3.54 (*) 4.22 - 5.81 MIL/uL   Hemoglobin 10.7 (*) 13.0 - 17.0 g/dL   HCT 31.2 (*) 39.0 - 52.0 %   MCV 88.1  78.0 - 100.0 fL   MCH 30.2  26.0 - 34.0 pg   MCHC 34.3  30.0 - 36.0 g/dL   RDW 13.3  11.5 - 15.5 %   Platelets 322  150 - 400 K/uL  BASIC METABOLIC PANEL     Status: Abnormal   Collection Time    05/03/14  7:00 AM      Result Value Ref Range   Sodium 137  137 - 147 mEq/L   Potassium 4.3  3.7 - 5.3 mEq/L   Chloride 102  96 - 112 mEq/L   CO2 24  19 - 32 mEq/L   Glucose, Bld 127 (*) 70 - 99 mg/dL   BUN 33 (*) 6 - 23 mg/dL   Creatinine, Ser 0.88  0.50 - 1.35 mg/dL   Calcium 8.7  8.4 - 10.5 mg/dL   GFR calc non Af Amer 86 (*) >90 mL/min   GFR calc Af Amer >90  >90 mL/min   Comment: (NOTE)     The eGFR has been calculated using the CKD EPI equation.     This calculation has not been validated in all clinical situations.     eGFR's persistently <90 mL/min signify possible Chronic Kidney     Disease.  GLUCOSE, CAPILLARY     Status: Abnormal   Collection Time    05/03/14  8:12 AM      Result Value Ref Range   Glucose-Capillary 139 (*) 70 - 99 mg/dL   No results found.     Medical Problem List and Plan: 1. 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  Order for protective helmet 2.  DVT Prophylaxis/Anticoagulation: SDH. Chronic Coumadin discontinued secondary to SDH 3. Pain Management: Tylenol as needed 4. History of alcohol tobacco abuse. Monitor for any signs of withdrawal. Provide counseling 5.  Neuropsych: This patient is capable of making decisions on his own behalf. 6. Seizure prophylaxis. Keppra 500 mg twice a day. Monitor for any  seizure activity 7. Atrial fibrillation. Chronic Coumadin discontinued. Continue Cardizem 60 mg 4 times a day, Coreg 25 mg twice a day 8. Pseudomonas UTI. Levaquin initiated 05/02/2014 9. History of CAD with CABG. Chest pain or shortness of breath. Continue to monitor 10. Hyperlipidemia. Lipitor 11. Hyperglycemia. Monitor blood sugars while on Decadron taper   Post Admission Physician Evaluation: 1. Functional deficits secondary  to right temporal contusions/subdural hematoma s/p eventual craniectomy 2. Patient is admitted to receive collaborative, interdisciplinary care between the physiatrist, rehab nursing staff, and therapy team. 3. Patient's level of medical complexity and substantial therapy needs in context of that medical necessity cannot be provided at a lesser intensity of care such as a SNF. 4. Patient has experienced substantial functional loss from his/her baseline which was documented above under the "Functional History" and "Functional Status" headings.  Judging by the patient's diagnosis, physical exam, and functional history, the patient has potential for functional progress which will result in measurable gains while on inpatient rehab.  These gains will be of substantial and practical use upon discharge  in facilitating mobility and self-care at the household level. 5. Physiatrist will provide 24 hour management of medical needs as well as oversight of the therapy plan/treatment and provide guidance as appropriate regarding the interaction of the two. 6. 24 hour rehab nursing will assist with bladder management, bowel management, safety, skin/wound care, disease management, medication administration, pain management and patient education  and help integrate therapy concepts, techniques,education, etc. 7. PT will assess and treat for/with:  Lower extremity strength, range of motion, stamina, balance, functional mobility, safety, adaptive techniques and equipment, NMR, craniectomy site protections, visual perceptual awareness, pain mgt, caregiver ed, egosupport, BI education.   Goals are: supervision to mod I. 8. OT will assess and treat for/with: ADL's, functional mobility, safety, upper extremity strength, adaptive techniques and equipment, NMR, fall-prevention education, manipulation of helmet, egosupport, brain injury education .   Goals are: mod I to supervision. 9. SLP will assess and treat for/with: cognition, communication.  Goals are: mod I to supervision. 10. Case Management and Social Worker will assess and treat for psychological issues and discharge planning. 11. Team conference will be held weekly to assess progress toward goals and to determine barriers to discharge. 12. Patient will receive at least 3 hours of therapy per day at least 5 days per week. 13. ELOS: 5-7 days       14. Prognosis:  excellent     Meredith Staggers, MD, [facility]   05/03/2014

## 2014-05-04 NOTE — Discharge Summary (Signed)
Physician Discharge Summary  Alejandro [name] JKD:[id number] DOB: 1946/05/23 DOA: 04/19/2014  PCP: [name], MD  Admit date: 04/19/2014 Discharge date: 05/04/2014  Time spent: 35 minutes  Recommendations for Outpatient Follow-up:  1. No coumadin ever as had life threatening bleed-Risks outweight benfit 2. Get cmet, cbc in 1 week 3. Follow with Dr. Saintclair Halsted as OP 4. consider increased dose cardizem from 180-->240 mg if suboptimal rate control 5. Consider addition diuretics as OP  Discharge Diagnoses:  Active Problems:   Subdural hematoma   SDH (subdural hematoma)   Altered mental status   Accelerated hypertension   Acute delirium   Alcohol withdrawal   UTI (urinary tract infection)   Discharge Condition: fair  Diet recommendation: dysphagia 3  Filed Weights   05/01/14 0500 05/02/14 0330 05/04/14 0637  Weight: 104.826 kg (231 lb 1.6 oz) 103.692 kg (228 lb 9.6 oz) 104.282 kg (229 lb 14.4 oz)    History of present illness:  68 year old male with history of a-fib on coumadin presented to the ED after a fall. In the ED a head CT was done that revealed SDH with midline shift, hemorrhagic contusion on the right temporal lobe and white matter disease. Neurosurgery was consulted, underwent craniotomy and hematoma evacuation on 5/18.Repeat CT head on 5/22 showed evolution of intraparenchymal hemorrhagic contusion or possibly venous infarction in the right temporal lobe with significant right temporal lobe swelling, subsequently underwent Reexploration of right = craniotomy for partial temporal lobectomy and evacuation of right temporal hematoma with implantation of the bone flap in the right lateral abdominal wall cavity. Patient remained extubated post op, and was subsequently extubated on 5/27.Transfered to [facility] on 5/31.    Hospital Course:  Subdural hematoma/ICH  -traumatic, while on coumadin  -Underwent Craniotomy, hematoma evacuation on 5/18. On 5/22 repeat CT head showed evolution of  intraparenchymal hemorrhagic contusion or possibly venous infarction in the right temporal lobe with significant right temporal lobe swelling. Taken back to the OR on 5/22 for Reexploration of right pterional craniotomy for partial temporal lobectomy and evacuation of right temporal hematoma with implantation of the bone flap in the right lateral abdominal wall cavity.  -Currently awake and alert and no residual weakness.  Taper decadron as written by NS Dr. Jori Moll given t patient -PT recommends CIR. Has been evaluated and pending insurance approval.  -not a candidate for anticoagulation any longer.  Acute Ventilator Dependent Resp Failure  -in setting ICH with worsening mental status and repeat OR for evolving hemorrhage  -extubated 5/27-  Atx/collapse L hemithorax  -resolved post bscopy 5/23  -BAL 5/23 negative  Afib  -stable  -c/w continue Cardizem [trasntioned to 180 Cd on day of d/c--may need uptitration] and Coreg  -not a candidate for anticoagulation anymore  HTN  -stable on Cardizem and Coreg  Alcoholic Delirium  -resolved  Pseudomonal UTI  -on Levaquin-since 5/29 , complete on 6/4  Chronic Systolic CHF  -last Echo-EF 02/24/13-30-35 percent  -compensated  -hold diuretics for now   Consultants:  Procedures:  LINES / TUBES:  ETT 5/22>>>5/27  Left radial 5/22>>>5/27  5/18 OR >>> Craniotomy, hematoma evacuation  5/22 OR>> Reexploration of right pterional craniotomy for partial temporal lobectomy and evacuation of right temporal hematoma with implantation of the bone flap in the right lateral abdominal wall cavity  CONSULTS:  pulmonary/intensive care  Neurosurgery,  CIR  antibiotics  Levaquin 5/29-6/4   Discharge Exam: Filed Vitals:   05/04/14 0826  BP: 134/68  Pulse: 67  Temp: 97.5 F (  36.4 C)  Resp: 18    General: alert pleasant oriented, in NAD Cardiovascular:  s1 s2 no m/r/g Respiratory: clear, no added sound  Discharge Instructions You were cared for  by a hospitalist during your hospital stay. If you have any questions about your discharge medications or the care you received while you were in the hospital after you are discharged, you can call the unit and asked to speak with the hospitalist on call if the hospitalist that took care of you is not available. Once you are discharged, your primary care physician will handle any further medical issues. Please note that NO REFILLS for any discharge medications will be authorized once you are discharged, as it is imperative that you return to your primary care physician (or establish a relationship with a primary care physician if you do not have one) for your aftercare needs so that they can reassess your need for medications and monitor your lab values.  Discharge Instructions   Diet - low sodium heart healthy    Complete by:  As directed      Discharge instructions    Complete by:  As directed   NO Coumadin See Dr. Saintclair Halsted as an outpatient in about 1 -2 weeks or has been discussed with you. Continue Decadron taper as ordered     Increase activity slowly    Complete by:  As directed             Medication List    STOP taking these medications       warfarin 2 MG tablet  Commonly known as:  COUMADIN      TAKE these medications       carvedilol 25 MG tablet  Commonly known as:  COREG  Take 1 tablet (25 mg total) by mouth 2 (two) times daily with a meal.     dexamethasone 4 MG tablet  Commonly known as:  DECADRON  Take 1 tablet (4 mg total) by mouth every 8 (eight) hours.     dexamethasone 4 MG tablet  Commonly known as:  DECADRON  Take 1 tablet (4 mg total) by mouth every 12 (twelve) hours.  Start taking on:  05/06/2014     dexamethasone 2 MG tablet  Commonly known as:  DECADRON  Take 1 tablet (2 mg total) by mouth every 8 (eight) hours.  Start taking on:  05/09/2014     dexamethasone 2 MG tablet  Commonly known as:  DECADRON  Take 1 tablet (2 mg total) by mouth every 12  (twelve) hours.  Start taking on:  05/12/2014     dexamethasone 2 MG tablet  Commonly known as:  DECADRON  Take 1 tablet (2 mg total) by mouth daily.  Start taking on:  05/15/2014     diltiazem 180 MG 24 hr capsule  Commonly known as:  CARDIZEM CD  Take 1 capsule (180 mg total) by mouth daily.     furosemide 80 MG tablet  Commonly known as:  LASIX  Take 0.5 tablets (40 mg total) by mouth daily.     ibuprofen 200 MG tablet  Commonly known as:  ADVIL,MOTRIN  Take 200 mg by mouth every 6 (six) hours as needed for pain.     levETIRAcetam 100 MG/ML solution  Commonly known as:  KEPPRA  Take 5 mLs (500 mg total) by mouth 2 (two) times daily.     levofloxacin 750 MG tablet  Commonly known as:  LEVAQUIN  Take 1 tablet (750 mg total)  by mouth daily.     lisinopril 20 MG tablet  Commonly known as:  PRINIVIL,ZESTRIL  TAKE 1 TABLET EVERY DAY     pantoprazole 40 MG tablet  Commonly known as:  PROTONIX  Take 1 tablet (40 mg total) by mouth at bedtime.     simvastatin 40 MG tablet  Commonly known as:  ZOCOR  Take 40 mg by mouth every evening.       No Known Allergies    The results of significant diagnostics from this hospitalization (including imaging, microbiology, ancillary and laboratory) are listed below for reference.    Significant Diagnostic Studies: Ct Head Wo Contrast  04/28/2014   CLINICAL DATA:  Follow-up intracranial hemorrhage, postoperative evaluation.  EXAM: CT HEAD WITHOUT CONTRAST  TECHNIQUE: Contiguous axial images were obtained from the base of the skull through the vertex without intravenous contrast.  COMPARISON:  CT of the head Apr 23, 2014.  FINDINGS: Status post interval right decompressive craniectomy ; no external herniation of right cerebrum via through the defect. Small amount of right frontal extra-axial pneumocephalus without right extra-axial blood products on this examination. However, right frontal low-density 7 mm extra-axial fluid collection all  seen. Larger right scalp low-density fluid collection and air with overlying skin staples.  Interval evacuation of the right temporal intraparenchymal hematoma with minimal residual intraparenchymal blood, surrounding low-density presumed vasogenic and possibly cytotoxic edema, mild right uncal herniation without midline shift. No hydrocephalus. Apparent remote small right cerebellar infarct.  Life support lines in place. Trace paranasal sinus mucosal thickening without air-fluid levels. The mastoid air cells are well aerated. 3 x 13 mm (transverse by AP) extraconal mass within the medial right orbit laterally displaces the medial rectus muscle, axial 13/94. Prominent appearance of the superior ophthalmic veins likely due to positive end pressure.  IMPRESSION: Status post interval right decompressive craniectomy and evacuation of right temporal hematoma with residual low-density vasogenic and likely cytotoxic edema, mild right uncal herniation without midline shift.  Resolution of right extra-axial pneumocephalus and fluid collection, new 7 mm left frontal low-density presumed hygroma.  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  Larger right scalp fluid collection which may be postoperative (seroma versus less likely pseudomeningocele).   Electronically Signed   By: Elon Alas   On: 04/28/2014 05:24   Ct Head Wo Contrast  04/23/2014   CLINICAL DATA:  Followup after subdural hematoma evacuation. Patient anticoagulated.  EXAM: CT HEAD WITHOUT CONTRAST  TECHNIQUE: Contiguous axial images were obtained from the base of the skull through the vertex without contrast.  COMPARISON:  04/20/2014  FINDINGS: Continued improvement in the right subdural hematoma status post evacuation. Only minimal residual extra-axial fluid and air up to 5 mm thick.  Interval worsening of right  temporal lobe intracerebral hematoma. A second component has developed in the more medial and posterior right temporal lobe 2.5 x 3.0 cm cross-section. Due to the location of this bleed, there is medial displacement of the uncus and hippocampus into the suprasellar cistern. Moderate worsening surrounding edema, not clearly vasogenic, could represent superimposed right temporal lobe MCA territory infarction with cytotoxic edema ( image 14 series 201). Recommend close clinical followup to exclude uncal herniation.  IMPRESSION: Continued improvement right subdural hematoma.  Worsening right temporal lobe intracerebral hematoma, possibly with superimposed temporal lobe infarct versus worsening vasogenic edema. Early uncal herniation.  Findings discussed with ordering  provider.   Electronically Signed   By: Rolla Flatten M.D.   On: 04/23/2014 13:15   Ct Head Wo Contrast  04/20/2014   CLINICAL DATA:  Followup subdural hematoma.  EXAM: CT HEAD WITHOUT CONTRAST  TECHNIQUE: Contiguous axial images were obtained from the base of the skull through the vertex without intravenous contrast.  COMPARISON:  CT HEAD W/O CM dated 04/19/2014  FINDINGS: Status post interval right frontotemporal craniotomy for evacuation of subdural hematoma with extra-axial drain in place via a right parietal burr hole. Near complete evacuation of the extra-axial fluid collection, with small to moderate amount of right extra-axial pneumocephalus. 3 mm residual right to left midline shift, previously 10 mm. Re-expanded right lateral ventricle, no hydrocephalus.  Patchy supratentorial white matter hypodensities again seen, with less conspicuous right cerebellar hypodensity, though prior examination was technically superior. No acute large vascular territory infarct. Basal cisterns are patent, very mild right uncal herniation.  Trace paranasal sinus mucosal thickening without air-fluid levels. Patient is edentulous. Mastoid air cells are well aerated.  Severe calcific atherosclerosis of the carotid siphons. Ocular globes and orbital contents are nonsuspicious.  Right posterior temporal lobe 28 x 22 mm evolving intraparenchymal hemorrhagic contusion with surrounding low-density vasogenic edema.  IMPRESSION: Interval right frontotemporal craniotomy for near complete evacuation of right subdural hematoma with or surgical drain in place. Minimal residual right to left midline shift, with re-expanded right lateral ventricle, no hydrocephalus.  Evolving right posterior temporal lobe hemorrhagic contusion.  Patchy white matter changes reflect chronic small vessel ischemic disease and/or, possible contusions, with less conspicuous right cerebellar hypodensity.   Electronically Signed   By: Elon Alas   On: 04/20/2014 04:59   Ct Head Wo Contrast  04/19/2014   CLINICAL DATA:  Choking sensation with fall and left forehead lacerations.  EXAM: CT HEAD WITHOUT CONTRAST  TECHNIQUE: Contiguous axial images were obtained from the base of the skull through the vertex without intravenous contrast.  COMPARISON:  None.  FINDINGS: There is a large acute hemispheric right subdural hematoma, measuring up to 2.0 cm in thickness. There is resulting midline shift by 1 cm. A small amount of blood tracks along the right tentorium. Posteriorly in the right temporal lobe is a probable adjacent focus of hemorrhagic contusion measuring up to 2.3 x 2.7 cm transverse on image 11.  Patchy periventricular white matter disease is present bilaterally. 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 No signs of acute stroke are demonstrated. There is no hydrocephalus or intraventricular hemorrhage.  The visualized paranasal sinuses, mastoid air cells and middle ears are clear. The calvarium is intact.  IMPRESSION: 1. Large right hemispheric subdural hematoma with associated midline shift. 2. Moderate size hemorrhagic contusion involving the right temporal lobe posteriorly. 3. Underlying  periventricular white matter disease. No signs of acute stroke or hydrocephalus. 4. Critical Value/emergent results were called by telephone at the time of interpretation on 04/19/2014 at 1:45 PM to Dr. Jola Schmidt, who verbally acknowledged these results.   Electronically Signed   By: Camie Patience M.D.   On: 04/19/2014 13:50   Dg Chest Port 1 View  04/29/2014   CLINICAL DATA:  Edema, followup  EXAM: PORTABLE CHEST - 1 VIEW  COMPARISON:  Portable chest x-ray of 04/28/2014  FINDINGS: The endotracheal tube has been removed as has the feeding tube. Aeration has improved and there has been improvement in the degree of pulmonary vascular congestion. Cardiomegaly is stable. Median sternotomy sutures are noted.  IMPRESSION: Endotracheal tube removed.  Improved aeration with improvement in pulmonary vascular congestion.   Electronically Signed   By: Ivar Drape M.D.   On: 04/29/2014 08:10   Dg Chest Port 1 View  04/28/2014   CLINICAL DATA:  ET tube evaluation.  Followup lung opacities.  EXAM: PORTABLE CHEST - 1 VIEW  COMPARISON:  04/27/2014.  FINDINGS: Changes from CABG surgery are stable. Cardiac silhouette is mildly enlarged. Vascular congestion, irregular interstitial opacities and hazy lung base opacity has mildly increased, most evident at the right lung base. There are small effusions, larger on the left. No pneumothorax.  Endotracheal tube tip lies 2.7 cm above the carina. Enteric tube passes into the stomach, below the included field of view  IMPRESSION: Worsened lung aeration with increased irregular interstitial opacities and mild hazy airspace opacity consistent with worsened congestive heart failure.   Electronically Signed   By: Lajean Manes M.D.   On: 04/28/2014 08:05   Dg Chest Portable 1 View  04/27/2014   CLINICAL DATA:  Atelectasis/collapse.  EXAM: PORTABLE CHEST - 1 VIEW  COMPARISON:  04/26/2014 and 04/25/2014.  FINDINGS: 0552 hr. The endotracheal tube and feeding tube appear unchanged, the tip  of the latter not visualized. There is stable cardiomegaly, pulmonary edema and left greater than right basilar airspace opacities. A small left pleural effusion is suspected. Patchy perihilar opacities on the right are stable. There is no pneumothorax.  IMPRESSION: Stable edema and bibasilar airspace opacities.   Electronically Signed   By: Camie Patience M.D.   On: 04/27/2014 08:07   Dg Chest Portable 1 View  04/26/2014   CLINICAL DATA:  Atelectasis  EXAM: PORTABLE CHEST - 1 VIEW  COMPARISON:  Prior chest x-ray 04/25/2014  FINDINGS: The endotracheal tube is 4.2 cm above the carina. A feeding tube is present, the tip is not identified however the tube extends off the scarring below the diaphragm. Stable cardiomegaly. Slightly improved aeration of the left lung base may reflect resolving atelectasis. There is persistent pulmonary vascular congestion bordering on interstitial edema as well as foci of patchy opacity in the right upper and mid lung. 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 Atherosclerotic calcifications noted in the transverse aorta.  IMPRESSION: 1. Slightly improved aeration in the left lung base may reflect improving atelectasis. 2. Persistent patchy opacities in the right upper lung and right mid lung may reflect asymmetric edema, areas of subsegmental atelectasis or foci of infection/inflammation. 3. Stable cardiomegaly and pulmonary vascular congestion bordering on mild interstitial edema. 4. Stable and satisfactory support apparatus.   Electronically Signed   By: Jacqulynn Cadet M.D.   On: 04/26/2014 08:52   Dg Chest Portable 1 View  04/25/2014   CLINICAL DATA:  68 year old male with subdural hematoma. Respiratory failure and difficulty.  EXAM: PORTABLE CHEST - 1 VIEW  COMPARISON:  04/23/2014 and prior chest radiographs  FINDINGS: Cardiomegaly and prior cardiac surgical changes noted.  An endotracheal tube is is identified with tip 3.5 cm above the  carina.  A small bore feeding tube is again identified entering the stomach with tip off the field of view.  Significantly improved left lung aeration is noted compatible with significant improved atelectasis. Persistent left lower lung opacity is noted and may represent residual atelectasis or airspace disease/pneumonia.  Mild pulmonary vascular congestion is noted.  Mild right basilar atelectasis is identified.  There is no evident thorax.  IMPRESSION: Significantly improved aeration within the left lung compatible with improved left lung atelectasis. Persistent left  lower lung opacity may represent atelectasis or airspace disease pneumonia. .   Electronically Signed   By: Hassan Rowan M.D.   On: 04/25/2014 09:44   Dg Chest Port 1 View  04/23/2014   CLINICAL DATA:  Endotracheal tube placement.  Respiratory failure.  EXAM: PORTABLE CHEST - 1 VIEW  COMPARISON:  04/19/2014  FINDINGS: Endotracheal tube tip is approximately 2 cm above the carina. Feeding tube tip is below the diaphragm.  There is almost complete opacification of the left hemi thorax with a left pleural effusion. Heart and mediastinal structures are shifted to the left consistent with atelectasis in the left lung.  There is a focal linear atelectasis in the right upper lobe.  Pulmonary vascularity is normal.  No acute osseous abnormality.  IMPRESSION: New almost complete opacification of the left hemi thorax with a combination of atelectasis and pleural effusion.   Electronically Signed   By: Rozetta Nunnery M.D.   On: 04/23/2014 19:46   Dg Chest Port 1 View  04/19/2014   CLINICAL DATA:  Difficulty breathing  EXAM: PORTABLE CHEST - 1 VIEW  COMPARISON:  January 27, 2013  FINDINGS: There is mild interstitial edema. There is no airspace consolidation. Heart is enlarged with pulmonary vascularity within normal limits. Patient is status post median sternotomy. No adenopathy.  IMPRESSION: Suspect a degree of chronic congestive heart failure. There is slight  generalized interstitial edema with cardiomegaly. There is no airspace consolidation.   Electronically Signed   By: Lowella Grip M.D.   On: 04/19/2014 16:15   Dg Abd Portable 1v  04/22/2014   CLINICAL DATA:  Panda tube placement  EXAM: PORTABLE ABDOMEN - 1 VIEW  COMPARISON:  DG ABD PORTABLE 1V dated 04/22/2014  FINDINGS: Metallic feeding tube tip has been advanced and again projects in the region of the body of the stomach. Bowel gas pattern is normal.  IMPRESSION: Interval advancement of the enteric feeding tube tip again projecting in the region of the body of the stomach.   Electronically Signed   By: Margaree Mackintosh M.D.   On: 04/22/2014 18:43   Dg Abd Portable 1v  04/22/2014   CLINICAL DATA:  Feeding tube placement.  EXAM: PORTABLE ABDOMEN - 1 VIEW  COMPARISON:  None.  FINDINGS: Feeding tube tip is in the body of the stomach just below the gastroesophageal junction. Bowel gas pattern is normal.  IMPRESSION: Feeding tube tip in the body of the stomach just below the gastroesophageal junction.   Electronically Signed   By: Rozetta Nunnery M.D.   On: 04/22/2014 11:36    Microbiology: Recent Results (from the past 240 hour(s))  CULTURE, BAL-QUANTITATIVE     Status: None   Collection Time    04/24/14  1:45 PM      Result Value Ref Range Status   Specimen Description BRONCHIAL ALVEOLAR LAVAGE   Final   Special Requests NONE   Final   Gram Stain     Final   Value: RARE WBC PRESENT,BOTH PMN AND MONONUCLEAR     NO SQUAMOUS EPITHELIAL CELLS SEEN     NO ORGANISMS SEEN     Performed at [facility]     Final   Value: 2,000 COLONIES/ML     Performed at [facility]   Culture     Final   Value: Non-Pathogenic Oropharyngeal-type Flora Isolated.     Performed at [facility]   Report Status 04/26/2014 FINAL   Final  CULTURE,  BLOOD (ROUTINE X 2)     Status: None   Collection Time    04/27/14 11:22 AM      Result Value Ref Range Status   Specimen Description  BLOOD RIGHT HAND   Final   Special Requests BOTTLES DRAWN AEROBIC AND ANAEROBIC 5CC   Final   Culture  Setup Time     Final   Value: 04/27/2014 16:26     Performed at [facility]   Culture     Final   Value: NO GROWTH 5 DAYS     Performed at [facility]   Report Status 05/03/2014 FINAL   Final  CULTURE, BLOOD (ROUTINE X 2)     Status: None   Collection Time    04/27/14 11:29 AM      Result Value Ref Range Status   Specimen Description BLOOD RIGHT HAND   Final   Special Requests     Final   Value: BOTTLES DRAWN AEROBIC AND ANAEROBIC 10CC AER 2CC ANA   Culture  Setup Time     Final   Value: 04/27/2014 16:26     Performed at [facility]   Culture     Final   Value: NO GROWTH 5 DAYS     Performed at [facility]   Report Status 05/03/2014 FINAL   Final  URINE CULTURE     Status: None   Collection Time    04/28/14 10:10 AM      Result Value Ref Range Status   Specimen Description URINE, RANDOM   Final   Special Requests NONE   Final   Culture  Setup Time     Final   Value: 04/28/2014 12:46     Performed at [place]     Final   Value: >=100,000 COLONIES/ML     Performed at [facility]   Culture     Final   Value: PSEUDOMONAS AERUGINOSA     Performed at [facility]   Report Status 04/30/2014 FINAL   Final   Organism ID, Bacteria PSEUDOMONAS AERUGINOSA   Final  CSF CULTURE     Status: None   Collection Time    04/28/14 10:10 AM      Result Value Ref Range Status   Specimen Description CSF   Final   Special Requests 1.0ML CSF FLUID   Final   Gram Stain     Final   Value: MODERATE WBC PRESENT,BOTH PMN AND MONONUCLEAR     NO ORGANISMS SEEN     Performed at [facility]     Performed at [facility]   Culture     Final   Value: NO GROWTH 3 DAYS     Performed at [facility]   Report Status 05/01/2014 FINAL   Final  GRAM STAIN     Status: None   Collection Time     04/28/14 10:10 AM      Result Value Ref Range Status   Specimen Description CSF   Final   Special Requests 1.0ML CSF FLUID   Final   Gram Stain     Final   Value: DIRECT SMEAR     MODERATE WBC PRESENT,BOTH PMN AND MONONUCLEAR     NO ORGANISMS SEEN   Report Status 04/28/2014 FINAL   Final     Labs: Basic Metabolic Panel:  Recent Labs Lab 04/28/14 0500 04/29/14 0308 04/30/14 0318 05/01/14 0604 05/03/14  0700  NA 147 141 143 140 137  K 4.0 4.0 3.8 4.0 4.3  CL 110 102 103 104 102  CO2 26 28 26 25 24   GLUCOSE 115* 148* 158* 147* 127*  BUN 27* 26* 36* 38* 33*  CREATININE 1.13 0.97 0.95 0.95 0.88  CALCIUM 8.4 9.1 9.5 9.1 8.7  MG  --   --  2.5  --   --   PHOS  --   --  3.5  --   --    Liver Function Tests:  Recent Labs Lab 04/28/14 0500 04/29/14 0308  AST 45* 47*  ALT 30 36  ALKPHOS 72 84  BILITOT 0.5 0.8  PROT 5.8* 6.9  ALBUMIN 2.2* 2.5*   No results found for this basename: LIPASE, AMYLASE,  in the last 168 hours No results found for this basename: AMMONIA,  in the last 168 hours CBC:  Recent Labs Lab 04/28/14 0500 04/29/14 0308 05/03/14 0700  WBC 9.1 12.5* 15.8*  NEUTROABS 7.0 11.6*  --   HGB 8.5* 10.3* 10.7*  HCT 25.5* 30.6* 31.2*  MCV 92.7 90.3 88.1  PLT 204 243 322   Cardiac Enzymes: No results found for this basename: CKTOTAL, CKMB, CKMBINDEX, TROPONINI,  in the last 168 hours BNP: BNP (last 3 results) No results found for this basename: PROBNP,  in the last 8760 hours CBG:  Recent Labs Lab 05/03/14 0812 05/03/14 1204 05/03/14 1709 05/03/14 2119 05/04/14 0753  GLUCAP 139* 234* 289* 171* 148*       Signed:  Jai-Gurmukh Nguyen Todorov  [facility] 05/04/2014, 10:42 AM

## 2014-05-04 NOTE — Interval H&P Note (Signed)
Alejandro [name] was admitted today to Inpatient Rehabilitation with the diagnosis of TBI.  The patient's history has been reviewed, patient examined, and there is no change in status.  Patient continues to be appropriate for intensive inpatient rehabilitation.  I have reviewed the patient's chart and labs.  Questions were answered to the patient's satisfaction.  Meredith Staggers 05/04/2014, 5:24 PM

## 2014-05-05 ENCOUNTER — Inpatient Hospital Stay (HOSPITAL_COMMUNITY): Payer: BC Managed Care – PPO | Admitting: Speech Pathology

## 2014-05-05 ENCOUNTER — Inpatient Hospital Stay (HOSPITAL_COMMUNITY): Payer: BC Managed Care – PPO | Admitting: Physical Therapy

## 2014-05-05 ENCOUNTER — Encounter (HOSPITAL_COMMUNITY): Payer: BC Managed Care – PPO

## 2014-05-05 ENCOUNTER — Inpatient Hospital Stay (HOSPITAL_COMMUNITY): Payer: BC Managed Care – PPO | Admitting: *Deleted

## 2014-05-05 ENCOUNTER — Inpatient Hospital Stay (HOSPITAL_COMMUNITY): Payer: BC Managed Care – PPO

## 2014-05-05 DIAGNOSIS — S069XAA Unspecified intracranial injury with loss of consciousness status unknown, initial encounter: Secondary | ICD-10-CM

## 2014-05-05 DIAGNOSIS — W19XXXA Unspecified fall, initial encounter: Secondary | ICD-10-CM

## 2014-05-05 DIAGNOSIS — I2510 Atherosclerotic heart disease of native coronary artery without angina pectoris: Secondary | ICD-10-CM

## 2014-05-05 DIAGNOSIS — I4891 Unspecified atrial fibrillation: Secondary | ICD-10-CM

## 2014-05-05 DIAGNOSIS — S069X9A Unspecified intracranial injury with loss of consciousness of unspecified duration, initial encounter: Secondary | ICD-10-CM

## 2014-05-05 DIAGNOSIS — I5022 Chronic systolic (congestive) heart failure: Secondary | ICD-10-CM

## 2014-05-05 LAB — COMPREHENSIVE METABOLIC PANEL
ALT: 65 U/L — ABNORMAL HIGH (ref 0–53)
AST: 27 U/L (ref 0–37)
Albumin: 2.3 g/dL — ABNORMAL LOW (ref 3.5–5.2)
Alkaline Phosphatase: 67 U/L (ref 39–117)
BUN: 28 mg/dL — AB (ref 6–23)
CALCIUM: 8.3 mg/dL — AB (ref 8.4–10.5)
CO2: 24 meq/L (ref 19–32)
CREATININE: 0.9 mg/dL (ref 0.50–1.35)
Chloride: 97 mEq/L (ref 96–112)
GFR calc Af Amer: >90 mL/min (ref >90)
GFR calc non Af Amer: 85 mL/min — ABNORMAL LOW (ref >90)
Glucose, Bld: 144 mg/dL — ABNORMAL HIGH (ref 70–99)
Potassium: 4.9 mEq/L (ref 3.7–5.3)
SODIUM: 131 meq/L — AB (ref 137–147)
TOTAL PROTEIN: 5.6 g/dL — AB (ref 6.0–8.3)
Total Bilirubin: 0.6 mg/dL (ref 0.3–1.2)

## 2014-05-05 LAB — GLUCOSE, CAPILLARY
GLUCOSE-CAPILLARY: 158 mg/dL — AB (ref 70–99)
GLUCOSE-CAPILLARY: 142 mg/dL — AB (ref 70–99)
GLUCOSE-CAPILLARY: 194 mg/dL — AB (ref 70–99)
GLUCOSE-CAPILLARY: 175 mg/dL — AB (ref 70–99)
Glucose-Capillary: 216 mg/dL — ABNORMAL HIGH (ref 70–99)

## 2014-05-05 LAB — CBC WITH DIFFERENTIAL/PLATELET
Basophils Absolute: 0 10*3/uL (ref 0.0–0.1)
Basophils Relative: 0 % (ref 0–1)
EOS ABS: 0 10*3/uL (ref 0.0–0.7)
EOS PCT: 0 % (ref 0–5)
HEMATOCRIT: 31.2 % — AB (ref 39.0–52.0)
Hemoglobin: 10.9 g/dL — ABNORMAL LOW (ref 13.0–17.0)
LYMPHS ABS: 0.6 10*3/uL — AB (ref 0.7–4.0)
LYMPHS PCT: 4 % — AB (ref 12–46)
MCH: 30.4 pg (ref 26.0–34.0)
MCHC: 34.9 g/dL (ref 30.0–36.0)
MCV: 86.9 fL (ref 78.0–100.0)
MONO ABS: 0.3 10*3/uL (ref 0.1–1.0)
MONOS PCT: 2 % — AB (ref 3–12)
Neutro Abs: 12.2 10*3/uL — ABNORMAL HIGH (ref 1.7–7.7)
Neutrophils Relative %: 94 % — ABNORMAL HIGH (ref 43–77)
PLATELETS: 303 10*3/uL (ref 150–400)
RBC: 3.59 MIL/uL — AB (ref 4.22–5.81)
RDW: 13 % (ref 11.5–15.5)
WBC: 13.1 10*3/uL — ABNORMAL HIGH (ref 4.0–10.5)

## 2014-05-05 NOTE — Evaluation (Signed)
Speech Language Pathology Assessment and Plan  Patient Details  Name: Alejandro [name] MRN: [id number] Date of Birth: 08-May-1946  SLP Diagnosis: Cognitive Impairments;Dysphagia  Rehab Potential: Excellent ELOS: 7-10 days    Today's Date: 05/05/2014 Time: 1300-1400 Time Calculation (min): 60 min  Problem List:  Patient Active Problem List   Diagnosis Date Noted  . UTI (urinary tract infection) 05/03/2014  . Acute delirium 04/23/2014  . Alcohol withdrawal 04/23/2014  . Subdural hematoma 04/19/2014  . SDH (subdural hematoma) 04/19/2014  . Altered mental status 04/19/2014  . Accelerated hypertension 04/19/2014  . Encounter for therapeutic drug monitoring 02/09/2014  . Recurrent ventral hernia 10/22/2012  . Skin lesion 09/26/2012  . Hernia 09/26/2012  . SYSTOLIC HEART FAILURE, CHRONIC 06/15/2009  . SYSTOLIC HEART FAILURE, ACUTE ON CHRONIC 01/28/2009  . HYPERLIPIDEMIA-MIXED 01/27/2009  . ATRIAL FIBRILLATION 01/27/2009  . CAD, ARTERY BYPASS GRAFT 12/23/2008   Past Medical History:  Past Medical History  Diagnosis Date  . Systolic heart failure   . 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 Dyslipidemia   . HTN (hypertension)   . Atrial fibrillation   . Cancer     around nose  . Tobacco abuse   . Alcohol abuse   . H/O hiatal hernia    Past Surgical History:  Past Surgical History  Procedure Laterality Date  . Coronary artery bypass graft  12/24/2008    4 vessel  . Umbilical hernia repair  2010  . Hernia repair  2012  . Coronary artery bypass graft  2012  . Ventral hernia repair N/A 01/22/2013    Procedure: HERNIA REPAIR VENTRAL ADULT;  Surgeon: [name], MD;  Location: [place];  Service: General;  Laterality: N/A;  . Application of a-cell of chest/abdomen N/A 01/22/2013    Procedure: APPLICATION OF A-CELL OF CHEST/ABDOMEN;  Surgeon: [name], MD;  Location: [facility];  Service: General;  Laterality: N/A;  . Cystoscopy N/A 01/22/2013    Procedure:  Consuela Mimes;  Surgeon: [name], MD;  Location: [place];  Service: Urology;  Laterality: N/A;  Cystoscopy with balloon dilation. Insertion of coude catheter.  . Craniotomy Right 04/19/2014    Procedure: CRANIOTOMY HEMATOMA EVACUATION SUBDURAL;  Surgeon: [name], MD;  Location: [place];  Service: Neurosurgery;  Laterality: Right;  right  . Craniotomy Right 04/23/2014    Procedure: Craniotomy for Intracerebral Hemorrhage;  Surgeon: [name], MD;  Location: [place] [facility];  Service: Neurosurgery;  Laterality: Right;    Assessment / Plan / Recommendation Clinical Impression Pt is a 68 y.o.right handed male with A fib on chronic coumadin,CAD with CABG, alcohol and tobacco abuse. Admitted 04/19/2014 after noted fall with laceration on his head. Cranial CT scan showed subdural hematoma with midline shift, hemorrhagic contusion on the right temporal lobe as well as right matter disease. INR on admission of 2.5 and reversed. Underwent right craniotomy for evacuation of acute subdural hematoma 04/19/2014 per Dr. Saintclair Halsted. Maintained on Maybeury for seizure prophylaxis. Initial postoperative CT scan stable with again repeat serial cranial CT scan 04/23/2014 showing expansion of right temporal intracerebral hematoma and possible venous infarction with temporal lobe swelling. Underwent reexploration of right craniotomy for partial temporal lobectomy and evacuation of right temporal hematoma with implantation of a bone flap in the right lateral abdominal wall cavity 04/23/2014. Patient remained intubated until 04/28/2014 and follow up per critical care services. Noted wound with some clear drainage out of one  area posterior aspect of incision suspicious for CSF that has since resolved . Maintained on steroid protocol with taper. Wound culture Gram stain no organisms seen. A urine study 04/28/2014 greater than 100,000 Pseudomonas maintained on Levaquin. . Currently on mechanical soft textures with thin liquids.  Follow-up physical and occupational therapy evaluations with recommendations for physical medicine rehabilitation consult. Patient was admitted for comprehensive rehabilitation program on 05/04/2014 and administered a cognitive-linguistic evaluation and BSE. Pt is currently demonstrating behaviors consistent with a Rancho Level VII characterized by impaired sustained attention, problem solving, intellectual awareness of deficits, organization and working memory which impacts pt's overall safety and independence. Pt was also tangential at times but was easily redirected. Pt consumed thin liquids via cup without overt s/s of aspiration but demonstrated subtle coughing with solid textures, suspect due to decreased attention during trials and talking with a food oral cavity. Recommend pt continue current diet of Dys. 3 textures with thin liquids with intermittent supervision. Suspect pt can upgrade once his overall attention improves. Patient will benefit from skilled SLP intervention to maximize cognitive and swallowing function in order to maximize his overall functional independence and decrease caregiver burden for planned discharge home. Anticipate pt will require intermittent supervision and follow-up OP at discharge.  Skilled Therapeutic Interventions          Pt administered a cognitive-linguistic evaluation and BSE. Please see above for details.   SLP Assessment  Patient will need skilled Speech Lanaguage Pathology Services during CIR admission    Recommendations  Diet Recommendations: Dysphagia 3 (Mechanical Soft);Thin liquid Liquid Administration via: Cup;Straw Medication Administration: Whole meds with liquid Supervision: Intermittent supervision to cue for compensatory strategies;Patient able to self feed Compensations: Slow rate;Small sips/bites Postural Changes and/or Swallow Maneuvers: Seated upright 90 degrees Oral Care Recommendations: Oral care BID Patient destination: Home Follow up  Recommendations: Outpatient SLP;24 hour supervision/assistance Equipment Recommended: None recommended by SLP    SLP Frequency 5 out of 7 days   SLP Treatment/Interventions Cognitive remediation/compensation;Cueing hierarchy;Dysphagia/aspiration precaution training;Functional tasks;Patient/family education;Therapeutic Activities;Internal/external aids;Environmental controls;Therapeutic Exercise    Pain   Prior Functioning    Short Term Goals: Week 1: SLP Short Term Goal 1 (Week 1): Pt will demonstrate selective attention in a mildly distracting enviornment for 45 minutes with supervision verbal cues.  SLP Short Term Goal 2 (Week 1): Pt will utilize external memory aids to recall new, daily information with supervision multimodal cues.  SLP Short Term Goal 3 (Week 1): Pt will identify 2 physical and 2 cognitive deficits with Min A multimodal cues.  SLP Short Term Goal 4 (Week 1): Pt will demonstrate functional problem solving for basic and familiar tasks with supervision multimodal cues.  SLP Short Term Goal 5 (Week 1): Pt will demonstrate efficient mastication of regular textures without overt s/s of aspiration with Mod I.  SLP Short Term Goal 6 (Week 1): Pt will consume current diet with Mod I to minimize overt s/s of aspiration.   See FIM for current functional status Refer to Care Plan for Long Term Goals  Recommendations for other services: Neuropsych  Discharge Criteria: Patient will be discharged from SLP if patient refuses treatment 3 consecutive times without medical reason, if treatment goals not met, if there is a change in medical status, if patient makes no progress towards goals or if patient is discharged from hospital.  The above assessment, treatment plan, treatment alternatives and goals were discussed and mutually agreed upon: by patient and by family  Buzzy Han 05/05/2014, 5:45  PM   

## 2014-05-05 NOTE — Progress Notes (Signed)
Physical Medicine and Rehabilitation Consult Reason for Consult:SDH Referring Physician: DR Nelda Marseille     HPI: Alejandro [name] is a 68 y.o.right handed male with A fib on chronic coumadin,CAD with CABG alcohol and tobacco abuse. Admitted 04/19/2014 after noted fall with laceration on his head. Cranial CT scan showed subdural hematoma with midline shift, hemorrhagic contusion on the right temporal lobe as well as right matter disease. INR on admission of 2.5 and reversed. Underwent right craniotomy for evacuation of acute subdural hematoma 04/19/2014 per Dr. Saintclair Halsted. Maintained on [place] for seizure prophylaxis. Initial postoperative CT scan stable with again repeat serial cranial CT scan 04/23/2014 showing expansion of right temporal intracerebral hematoma and possible venous infarction with temporal lobe swelling. Underwent reexploration of right craniotomy for partial temporal lobectomy and evacuation of right temporal hematoma with implantation of a bone flap in the right lateral abdominal wall cavity 04/23/2014. Patient remained intubated until 04/28/2014. 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 Wound culture Gram stain pending placed on empiric vancomycin as well as Tressie Ellis. Currently on a dysphagia 1 nectar thick liquid diet. Await formal followup physical and occupational therapy.  M.D. has requested physical medicine rehabilitation consult     Review of Systems  Cardiovascular: Positive for palpitations.  Musculoskeletal: Positive for falls and myalgias.  All other systems reviewed and are negative. Past Medical History   Diagnosis  Date   .  Systolic heart failure     .  CAD (coronary artery disease)         s/p CABGx4 on 12/24/2008   .  Dyslipidemia      .  HTN (hypertension)     .  Atrial fibrillation     .  Cancer         around nose   .  Tobacco abuse     .  Alcohol abuse     .  H/O hiatal hernia      Past Surgical History   Procedure  Laterality  Date   .  Coronary artery bypass graft    12/24/2008       4 vessel   .  Umbilical hernia repair    2010   .  Hernia repair    2012   .  Coronary artery bypass graft    2012   .  Ventral hernia repair  N/A  01/22/2013       Procedure: HERNIA REPAIR VENTRAL ADULT;  Surgeon: [name], MD;  Location: [place];  Service: General;  Laterality: N/A;   .  Application of a-cell of chest/abdomen  N/A  01/22/2013       Procedure: APPLICATION OF A-CELL OF CHEST/ABDOMEN;  Surgeon: [name], MD;  Location: [place];  Service: General;  Laterality: N/A;   .  Cystoscopy  N/A  01/22/2013       Procedure: Consuela Mimes;  Surgeon: [name], MD;  Location: [facility];  Service: Urology; Laterality: N/A;  Cystoscopy with balloon dilation. Insertion of coude catheter.   .  Craniotomy  Right  04/19/2014       Procedure: CRANIOTOMY HEMATOMA EVACUATION SUBDURAL;  Surgeon: [name], MD;  Location: [facility] [facility];  Service: Neurosurgery;  Laterality: Right;  right   .  Craniotomy  Right  04/23/2014       Procedure: Craniotomy for Intracerebral Hemorrhage;  Surgeon: [name], MD;  Location: [facility];  Service: Neurosurgery;  Laterality: Right;    Family History   Problem  Relation  Age of Onset   .  Diabetes  Mother     .  Heart disease  Mother     .  Diabetes  Father     .  Heart disease  Father     .  Heart disease  Brother         s/p CABG at 65   .  Colon cancer  Neg Hx     .  Prostate cancer  Neg Hx      Social History: reports that he has been smoking Cigarettes.  He has a 50 pack-year smoking history. He has never used smokeless tobacco. He reports that he drinks about 6 ounces of alcohol per week. He reports that he does not use illicit drugs. Allergies: No Known Allergies Medications Prior  to Admission   Medication  Sig  Dispense  Refill   .  carvedilol (COREG) 25 MG tablet  Take 1 tablet (25 mg total) by mouth 2 (two) times daily with a meal.   60 tablet   3   .  furosemide (LASIX) 80 MG tablet  Take 0.5 tablets (40 mg total) by mouth daily.   45 tablet   1   .  ibuprofen (ADVIL,MOTRIN) 200 MG tablet  Take 200 mg by mouth every 6 (six) hours as needed for pain.          Marland Kitchen  lisinopril (PRINIVIL,ZESTRIL) 20 MG tablet  TAKE 1 TABLET EVERY DAY   90 tablet   1   .  simvastatin (ZOCOR) 40 MG tablet  Take 40 mg by mouth every evening.         .  warfarin (COUMADIN) 2 MG tablet  Take 2-4 mg by mouth daily. 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 pt able to move LEs to EOB, used bedrails to assist Transfers Overall transfer level: Needs assistance Equipment used: 1 person hand held assist Transfers: Sit to/from Stand Sit to Stand: Min assist General transfer comment: Pt reliant on UE assist  Ambulation/Gait Ambulation/Gait assistance: Mod assist;+2 safety/equipment Ambulation Distance (Feet): 25 Feet Assistive device: Rolling walker (2 wheeled) Gait Pattern/deviations: Step-through pattern;Decreased stride length;Shuffle;Narrow base of support;Trunk flexed;Drifts right/left Gait velocity: slow General Gait Details: pt with apparent L sided neglect/inattention requiring max v/c's to manage  walker around obstacles/doorway. Pt with onset of fatigue   ADL: ADL Overall ADL's : Needs assistance/impaired Eating/Feeding: Set up;Supervision/ safety;Sitting Grooming: Wash/dry hands;Wash/dry face;Supervision/safety;Sitting Upper Body Bathing: Minimal assitance;Sitting Lower Body Bathing: Moderate assistance;Sit to/from stand Upper Body Dressing : Minimal assistance;Sitting Lower Body Dressing: Moderate assistance;Sit to/from stand Lower Body Dressing Details (indicate cue type and reason): able to don/doff socks in sitting with supervision.  Requires assist with balance and pulling  pants over hips   Toilet Transfer: Moderate assistance;Stand-pivot;BSC Toileting- Clothing Manipulation and Hygiene: Moderate assistance Functional mobility during ADLs: Moderate assistance (basic transfers) General ADL Comments: Pt slow to respond and lethargic throughout OT eval    Cognition: Cognition Overall Cognitive Status: Impaired/Different from baseline Orientation Level: Oriented to person;Disoriented to place;Disoriented to situation;Oriented to time Cognition Arousal/Alertness: Lethargic Behavior During Therapy: Flat affect Overall Cognitive Status: Impaired/Different from baseline Area of Impairment: Orientation;Problem solving Orientation Level: Disoriented to;Time (reliant on calendar) Problem Solving: Slow processing;Requires verbal cues General Comments: Pt lethargic during OT eval.  Pt able to read clock correctly, and able to describe events of day.  He demonstrates intellectual awareness   Blood pressure 110/87, pulse 94, temperature 98.3 F (36.8 C), temperature source Axillary, resp. rate 15, height 5\' 8"  (1.727 m), weight 104.4 kg (230 lb 2.6 oz), SpO2 97.00%. Physical Exam  Eyes:  Pupils round and reactive to light  Neck: Normal range of motion. Neck supple. No thyromegaly present.  Cardiovascular:  Cardiac rate controlled  Respiratory: Effort normal and breath sounds  normal. No respiratory distress.  GI: Soft. Bowel sounds are normal. He exhibits no distension.  Flap site dressed/intact  Neurological: He is alert.  Makes good eye contact with examiner.needs multi cues for place but was appropriate age.Bilat mittens in place.Followed simple commands. Moves all 4 limbs with ease. Confabulates/language of confusion. Can follow simple commands. Has minimal insight and awareness.   Skin:  Cranio site with clips and no visible drainage.     Results for orders placed during the hospital encounter of 04/19/14 (from the past 24 hour(s))   GLUCOSE, CAPILLARY     Status: Abnormal     Collection Time      04/28/14  7:47 AM       Result  Value  Ref Range     Glucose-Capillary  114 (*)  70 - 99 mg/dL   PROTEIN AND GLUCOSE, CSF     Status: Abnormal     Collection Time      04/28/14 10:10 AM       Result  Value  Ref Range     Glucose, CSF  71   43 - 76 mg/dL     Total  Protein, CSF  >600 (*)  15 - 45 mg/dL   CSF CELL COUNT WITH DIFFERENTIAL     Status: Abnormal     Collection Time      04/28/14 10:10 AM       Result  Value  Ref Range     Tube #  SUBMITTED IN CUP        Color, CSF  RED (*)  COLORLESS     Appearance, CSF  TURBID (*)  CLEAR     Supernatant  XANTHOCHROMIC        RBC Count, CSF  24850 (*)  0 /cu mm     WBC, CSF  128 (*)  0 - 5 /cu mm     Segmented Neutrophils-CSF  90 (*)  0 - 6 %     Lymphs, CSF  8 (*)  40 - 80 %     Monocyte-Macrophage-Spinal Fluid  2 (*)  15 - 45 %     Eosinophils, CSF  0   0 - 1 %   CSF CULTURE     Status: None     Collection Time      04/28/14 10:10 AM       Result  Value  Ref Range  Specimen Description  CSF        Special Requests  1.0ML CSF FLUID        Gram Stain           Value:  MODERATE WBC PRESENT,BOTH PMN AND MONONUCLEAR        NO ORGANISMS SEEN        Performed at [facility]        Performed at [facility]     Culture  PENDING        Report Status  PENDING      GRAM STAIN     Status:  None     Collection Time      04/28/14 10:10 AM       Result  Value  Ref Range     Specimen Description  CSF        Special Requests  1.0ML CSF FLUID        Gram Stain           Value:  DIRECT SMEAR        MODERATE WBC PRESENT,BOTH PMN AND MONONUCLEAR        NO ORGANISMS SEEN     Report Status  04/28/2014 FINAL      GLUCOSE, CAPILLARY     Status: Abnormal     Collection Time      04/28/14 11:35 AM       Result  Value  Ref Range     Glucose-Capillary  121 (*)  70 - 99 mg/dL   GLUCOSE, CAPILLARY     Status: Abnormal     Collection Time      04/28/14  4:16 PM       Result  Value  Ref Range     Glucose-Capillary  147 (*)  70 - 99 mg/dL     Comment 1  Notify RN        Comment 2  Documented in Chart      GLUCOSE, CAPILLARY     Status: Abnormal     Collection Time      04/28/14  8:07 PM       Result  Value  Ref Range     Glucose-Capillary  144 (*)  70 - 99 mg/dL     Comment 1  Notify RN        Comment 2  Documented in Chart      GLUCOSE, CAPILLARY     Status: Abnormal     Collection Time      04/28/14 11:49 PM       Result  Value  Ref Range     Glucose-Capillary  173 (*)  70 - 99 mg/dL     Comment 1  Documented in Chart        Comment 2  Notify RN      COMPREHENSIVE METABOLIC PANEL     Status: Abnormal     Collection Time      04/29/14  3:08 AM       Result  Value  Ref Range     Sodium  141   137 - 147 mEq/L     Potassium  4.0   3.7 - 5.3 mEq/L     Chloride  102   96 - 112 mEq/L     CO2  28   19 - 32 mEq/L     Glucose, Bld  148 (*)  70 - 99 mg/dL     BUN  26 (*)  6 - 23 mg/dL     Creatinine, Ser  0.97   0.50 - 1.35 mg/dL     Calcium  9.1   8.4 - 10.5 mg/dL     Total Protein  6.9   6.0 - 8.3 g/dL     Albumin  2.5 (*)  3.5 - 5.2 g/dL     AST  47 (*)  0 - 37 U/L     ALT  36   0 - 53 U/L     Alkaline Phosphatase  84   39 - 117 U/L     Total Bilirubin  0.8   0.3 - 1.2 mg/dL     GFR calc non Af Amer  83 (*)  >90 mL/min     GFR calc Af Amer  >90   >90 mL/min   CBC WITH  DIFFERENTIAL     Status: Abnormal     Collection Time      04/29/14  3:08 AM       Result  Value  Ref Range     WBC  12.5 (*)  4.0 - 10.5 K/uL     RBC  3.39 (*)  4.22 - 5.81 MIL/uL     Hemoglobin  10.3 (*)  13.0 - 17.0 g/dL     HCT  30.6 (*)  39.0 - 52.0 %     MCV  90.3   78.0 - 100.0 fL     MCH  30.4   26.0 - 34.0 pg     MCHC  33.7   30.0 - 36.0 g/dL     RDW  13.5   11.5 - 15.5 %     Platelets  243   150 - 400 K/uL     Neutrophils Relative %  93 (*)  43 - 77 %     Neutro Abs  11.6 (*)  1.7 - 7.7 K/uL     Lymphocytes Relative  5 (*)  12 - 46 %     Lymphs Abs  0.6 (*)  0.7 - 4.0 K/uL     Monocytes Relative  2 (*)  3 - 12 %     Monocytes Absolute  0.3   0.1 - 1.0 K/uL     Eosinophils Relative  0   0 - 5 %     Eosinophils Absolute  0.0   0.0 - 0.7 K/uL     Basophils Relative  0   0 - 1 %     Basophils Absolute  0.0   0.0 - 0.1 K/uL   GLUCOSE, CAPILLARY     Status: Abnormal     Collection Time      04/29/14  3:36 AM       Result  Value  Ref Range     Glucose-Capillary  140 (*)  70 - 99 mg/dL     Comment 1  Documented in Chart        Comment 2  Notify RN       Ct Head Wo Contrast   04/28/2014   CLINICAL DATA:  Follow-up intracranial hemorrhage, postoperative evaluation.  EXAM: CT HEAD WITHOUT CONTRAST  TECHNIQUE: Contiguous axial images were obtained from the base of the skull through the vertex without intravenous contrast.  COMPARISON:  CT of the head Apr 23, 2014.  FINDINGS: Status post interval right decompressive craniectomy ; no external herniation of right cerebrum via through the defect. Small amount of right frontal extra-axial pneumocephalus without right extra-axial blood products on  this examination. However, right frontal low-density 7 mm extra-axial fluid collection all seen. Larger right scalp low-density fluid collection and air with overlying skin staples.  Interval evacuation of the right temporal intraparenchymal hematoma with minimal residual intraparenchymal blood,  surrounding low-density presumed vasogenic and possibly cytotoxic edema, mild right uncal herniation without midline shift. No hydrocephalus. Apparent remote small right cerebellar infarct.  Life support lines in place. Trace paranasal sinus mucosal thickening without air-fluid levels. The mastoid air cells are well aerated. 3 x 13 mm (transverse by AP) extraconal mass within the medial right orbit laterally displaces the medial rectus muscle, axial 13/94. Prominent appearance of the superior ophthalmic veins likely due to positive end pressure.  IMPRESSION: Status post interval right decompressive craniectomy and evacuation of right temporal hematoma with residual low-density vasogenic and likely cytotoxic edema, mild right uncal herniation without midline shift.  Resolution of right extra-axial pneumocephalus and fluid collection, new 7 mm left frontal low-density presumed hygroma.  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  Larger right scalp fluid collection which may be postoperative (seroma versus less likely pseudomeningocele).   Electronically Signed   By: Elon Alas   On: 04/28/2014 05:24    Dg Chest Port 1 View   04/28/2014   CLINICAL DATA:  ET tube evaluation.  Followup lung opacities.  EXAM: PORTABLE CHEST - 1 VIEW  COMPARISON:  04/27/2014.  FINDINGS: Changes from CABG surgery are stable. Cardiac silhouette is mildly enlarged. Vascular congestion, irregular interstitial opacities and hazy lung base opacity has mildly increased, most evident at the right lung base. There are small effusions, larger on the left. No pneumothorax.  Endotracheal tube tip lies 2.7 cm above the carina. Enteric tube passes into the stomach, below the included field of view  IMPRESSION: Worsened lung aeration with increased irregular interstitial opacities and mild hazy airspace  opacity consistent with worsened congestive heart failure.   Electronically Signed   By: Lajean Manes M.D.   On: 04/28/2014 08:05     Assessment/Plan: Diagnosis: right temporal contusion/SDH s/p craniotomy and then craniectomy/lobectomy Does the need for close, 24 hr/day medical supervision in concert with the patient's rehab needs make it unreasonable for this patient to be served in a less intensive setting? Yes Co-Morbidities requiring supervision/potential complications: htn, afib, wound care Due to bladder management, bowel management, safety, skin/wound care, disease management, medication administration and patient education, does the patient require 24 hr/day rehab nursing? Yes Does the patient require coordinated care of a physician, rehab nurse, PT (1-2 hrs/day, 5 days/week), OT (1-2 hrs/day, 5 days/week) and SLP (1-2 hrs/day, 5 days/week) to address physical and functional deficits in the context of the above medical diagnosis(es)? Yes Addressing deficits in the following areas: balance, endurance, locomotion, strength, transferring, bowel/bladder control, bathing, dressing, feeding, grooming, toileting, cognition, speech, language, swallowing and psychosocial support Can the patient actively participate in an intensive therapy program of at least 3 hrs of therapy per day at least 5 days per week? Yes The potential for patient to make measurable gains while on inpatient rehab is excellent Anticipated functional outcomes upon discharge from inpatient rehab are supervision with PT, supervision with OT, min assist and mod assist with SLP. Estimated rehab length of stay to reach the above functional goals is: 13-18 days Does the patient have adequate social supports to accommodate these discharge functional goals? Yes and Potentially Anticipated  D/C setting: Home Anticipated post D/C treatments: HH therapy and Outpatient therapy Overall Rehab/Functional Prognosis:  excellent   RECOMMENDATIONS: This patient's condition is appropriate for continued rehabilitative care in the following setting: CIR Patient has agreed to participate in recommended program. N/A Note that insurance prior authorization may be required for reimbursement for recommended care.   Comment: Rehab Admissions Coordinator to follow up.   Thanks,   Meredith Staggers, MD, Mellody Drown         04/29/2014    Revision History...     Date/Time User Action   04/29/2014 9:32 AM Meredith Staggers, MD Sign   04/29/2014 7:53 AM Cathlyn Parsons, PA-C [place]  View Details Report   Routing History...     Date/Time From To Method   04/29/2014 9:32 AM Meredith Staggers, MD Meredith Staggers, MD In Basket   04/29/2014 9:32 AM Meredith Staggers, MD [place], MD In [facility]

## 2014-05-05 NOTE — Progress Notes (Signed)
[place] PHYSICAL MEDICINE & REHABILITATION     PROGRESS NOTE    Subjective/Complaints: Had a good night. Slept well. Denies pain. Ready to start therapies today.   Objective: Vital Signs: Blood pressure 147/75, pulse 70, temperature 97.9 F (36.6 C), temperature source Oral, resp. rate 18, SpO2 97.00%. No results found.  Recent Labs  05/03/14 0700 05/05/14 0515  WBC 15.8* 13.1*  HGB 10.7* 10.9*  HCT 31.2* 31.2*  PLT 322 303    Recent Labs  05/03/14 0700 05/05/14 0515  NA 137 131*  K 4.3 4.9  CL 102 97  GLUCOSE 127* 144*  BUN 33* 28*  CREATININE 0.88 0.90  CALCIUM 8.7 8.3*   CBG (last 3)   Recent Labs  05/04/14 1655 05/05/14 0158 05/05/14 0737  GLUCAP 270* 158* 142*    Wt Readings from Last 3 Encounters:  05/04/14 104.282 kg (229 lb 14.4 oz)  05/04/14 104.282 kg (229 lb 14.4 oz)  05/04/14 104.282 kg (229 lb 14.4 oz)    Physical Exam:  Sitting in chair. No distress. obese  Eyes:  Pupils round and reactive to light  Mouth: edentulous, mucosa pink and moist  Head: craniectomy site sunken in. No drainage. Wound well approximated  Neck: Normal range of motion. Neck supple. No thyromegaly present.  Cardiovascular:  Cardiac rate controlled without murmur  Respiratory: Effort normal and breath sounds normal. No respiratory distress.  GI: Soft. Bowel sounds are normal. He exhibits no distension.  Flap site dressed/intact. Minimally tender Neurological: He is alert.  Makes good eye contact with examiner. Far left upper quadrant of vision may be affected. No neglect. appropriate for name, place, age. Bilat mittens in place.Followed simple commands. Moves all 4 limbs with ease. Can follow simple commands. Improved insight and awareness. Recalls  biographical information. Non-impulsive. Sensory exam intact. dtr's 1+.  Skin:  Intact other than surgical sites  Psych: pleasant, cooperative, non-impulsive, non-anxious   Assessment/Plan: 1. Functional deficits  secondary to right SDH s/p ultimate craniectomy  which require 3+ hours per day of interdisciplinary therapy in a comprehensive inpatient rehab setting. Physiatrist is providing close team supervision and 24 hour management of active medical problems listed below. Physiatrist and rehab team continue to assess barriers to discharge/monitor patient progress toward functional and medical goals. FIM:                   Comprehension Comprehension Mode: Auditory Comprehension: 4-Understands basic 75 - 89% of the time/requires cueing 10 - 24% of the time  Expression Expression Mode: Verbal Expression: 4-Expresses basic 75 - 89% of the time/requires cueing 10 - 24% of the time. Needs helper to occlude trach/needs to repeat words.  Social Interaction Social Interaction: 3-Interacts appropriately 50 - 74% of the time - May be physically or verbally inappropriate.  Problem Solving Problem Solving: 2-Solves basic 25 - 49% of the time - needs direction more than half the time to initiate, plan or complete simple activities  Memory Memory: 4-Recognizes or recalls 75 - 89% of the time/requires cueing 10 - 24% of the time  Medical Problem List and Plan:  1. 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 Ordered protective helmet  2. DVT Prophylaxis/Anticoagulation: SDH. Chronic Coumadin discontinued secondary to SDH  3. Pain Management: Tylenol as needed  4. History of alcohol tobacco abuse. Monitor for any signs of withdrawal. Provide counseling  5. Neuropsych: This patient is capable of making decisions on his own behalf.  6. Seizure prophylaxis. Keppra 500 mg twice a day. Monitor for any seizure activity  7. Atrial fibrillation. Chronic Coumadin discontinued. Continue Cardizem 60 mg 4 times a  day, Coreg 25 mg twice a day  -encourage adequate fluid intake  8. Pseudomonas UTI. Levaquin initiated 05/02/2014  9. History of CAD with CABG. Chest pain or shortness of breath. Continue to monitor  10. Hyperlipidemia. Lipitor  11. Hyperglycemia. Monitor blood sugars while on Decadron taper- sugars still elevated  LOS (Days) 1 A FACE TO FACE EVALUATION WAS PERFORMED  Meredith Staggers 05/05/2014 8:44 AM

## 2014-05-05 NOTE — Evaluation (Addendum)
Occupational Therapy Assessment and Plan  Patient Details  Name: Alejandro [name] MRN: [id number] Date of Birth: 06/01/1946  OT Diagnosis: cognitive deficits and muscle weakness (generalized) Rehab Potential: Rehab Potential: Excellent ELOS: 7-10 days    Today's Date: 05/05/2014 Time: 1030-1130 Time Calculation (min): 60 min  Problem List:  Patient Active Problem List   Diagnosis Date Noted  . UTI (urinary tract infection) 05/03/2014  . Acute delirium 04/23/2014  . Alcohol withdrawal 04/23/2014  . Subdural hematoma 04/19/2014  . SDH (subdural hematoma) 04/19/2014  . Altered mental status 04/19/2014  . Accelerated hypertension 04/19/2014  . Encounter for therapeutic drug monitoring 02/09/2014  . Recurrent ventral hernia 10/22/2012  . Skin lesion 09/26/2012  . Hernia 09/26/2012  . SYSTOLIC HEART FAILURE, CHRONIC 06/15/2009  . SYSTOLIC HEART FAILURE, ACUTE ON CHRONIC 01/28/2009  . HYPERLIPIDEMIA-MIXED 01/27/2009  . ATRIAL FIBRILLATION 01/27/2009  . CAD, ARTERY BYPASS GRAFT 12/23/2008    Past Medical History:  Past Medical History  Diagnosis Date  . Systolic heart failure   . 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 Dyslipidemia   . HTN (hypertension)   . Atrial fibrillation   . Cancer     around nose  . Tobacco abuse   . Alcohol abuse   . H/O hiatal hernia    Past Surgical History:  Past Surgical History  Procedure Laterality Date  . Coronary artery bypass graft  12/24/2008    4 vessel  . Umbilical hernia repair  2010  . Hernia repair  2012  . Coronary artery bypass graft  2012  . Ventral hernia repair N/A 01/22/2013    Procedure: HERNIA REPAIR VENTRAL ADULT;  Surgeon: [name], MD;  Location: [facility];  Service: General;  Laterality: N/A;  . Application of a-cell of chest/abdomen N/A 01/22/2013    Procedure: APPLICATION OF A-CELL OF CHEST/ABDOMEN;  Surgeon: [name], MD;  Location: [facility];  Service: General;  Laterality: N/A;  .  Cystoscopy N/A 01/22/2013    Procedure: Consuela Mimes;  Surgeon: [name], MD;  Location: [facility];  Service: Urology;  Laterality: N/A;  Cystoscopy with balloon dilation. Insertion of coude catheter.  . Craniotomy Right 04/19/2014    Procedure: CRANIOTOMY HEMATOMA EVACUATION SUBDURAL;  Surgeon: [name], MD;  Location: [facility];  Service: Neurosurgery;  Laterality: Right;  right  . Craniotomy Right 04/23/2014    Procedure: Craniotomy for Intracerebral Hemorrhage;  Surgeon: [name], MD;  Location: [facility];  Service: Neurosurgery;  Laterality: Right;    Assessment & Plan Clinical Impression: Patient is a 68 y.o.right handed male with A fib on chronic coumadin,CAD with CABG, alcohol and tobacco abuse. Admitted 04/19/2014 after noted fall with laceration on his head. Cranial CT scan showed subdural hematoma with midline shift, hemorrhagic contusion on the right temporal lobe as well as right matter disease. INR on admission of 2.5 and reversed. Underwent right craniotomy for evacuation of acute subdural hematoma 04/19/2014 per Dr. Saintclair Halsted. Maintained on [place] for seizure prophylaxis. Initial postoperative CT scan stable with again repeat serial cranial CT scan 04/23/2014 showing expansion of right temporal intracerebral hematoma and possible venous infarction with temporal lobe swelling. Underwent reexploration of right craniotomy for partial temporal lobectomy and evacuation of right temporal hematoma with implantation of a bone flap in the right lateral abdominal wall cavity 04/23/2014. Patient remained intubated until 04/28/2014 and follow up per critical care services. Noted wound with some clear drainage  out of one area posterior aspect of incision suspicious for CSF that has since resolved . Maintained on steroid protocol with taper. Wound culture Gram stain no organisms seen. A urine study 04/28/2014 greater than 100,000 Pseudomonas maintained on Levaquin. . Currently on mechanical soft thin  liquid diet. Patient transferred to CIR on 05/04/2014 .    Patient currently requires min with basic self-care skills secondary to muscle weakness, decreased cardiorespiratoy endurance, decreased attention, decreased awareness, decreased problem solving, decreased safety awareness and decreased memory and decreased standing balance, decreased postural control and decreased balance strategies.  Prior to hospitalization, patient could complete BADLs with independent .  Patient will benefit from skilled intervention to increase independence with basic self-care skills and increase level of independence with iADL prior to discharge home with care partner.  Anticipate patient will require intermittent supervision and no further OT follow recommended.  OT - End of Session Activity Tolerance: Decreased this session Endurance Deficit: Yes Endurance Deficit Description: requires rest breaks OT Assessment Rehab Potential: Excellent OT Patient demonstrates impairments in the following area(s): Balance;Cognition;Endurance;Motor;Safety;Perception OT Basic ADL's Functional Problem(s): Grooming;Bathing;Dressing;Toileting OT Advanced ADL's Functional Problem(s): Simple Meal Preparation OT Transfers Functional Problem(s): Toilet;Tub/Shower OT Additional Impairment(s): None OT Plan OT Intensity: Minimum of 1-2 x/day, 45 to 90 minutes OT Frequency: 5 out of 7 days OT Duration/Estimated Length of Stay: 7-10 days  OT Treatment/Interventions: Balance/vestibular training;Cognitive remediation/compensation;Community reintegration;Discharge planning;DME/adaptive equipment instruction;Functional mobility training;Neuromuscular re-education;Patient/family education;Psychosocial support;Self Care/advanced ADL retraining;Therapeutic Activities;UE/LE Strength taining/ROM;Therapeutic Exercise;UE/LE Coordination activities OT Self Feeding Anticipated Outcome(s): n/a OT Basic Self-Care Anticipated Outcome(s): Mod I  OT  Toileting Anticipated Outcome(s): Mod I OT Bathroom Transfers Anticipated Outcome(s): Mod I OT Recommendation Patient destination: Home Follow Up Recommendations: Other (comment) (TBD) Equipment Recommended: Tub/shower seat   Skilled Therapeutic Intervention OT eval completed. Discussed safety plan, role of OT, goals, ELOS and [place] level with pt and wife. Completed self-care with focus on sequencing, organization, functional transfers, and dynamic standing balance. Completed bathing at sink secondary to pt declining shower at this time. Pt with limited intellectual awareness stating "I think i'm just a little weaker." Pt demonstrating impaired sequencing and organization during bathing task requiring mod cues for completion. Pt required min cues for sustained attention to task as well. Ambulated approx 8 feet w/c<>toilet using RW with min cues for positioning of RW and min assist. Pt demonstrating lateral lean to left and right with fatigue. Pt left sitting in w/c with wife present. Reviewed safety plan with both and agreeable.   OT Evaluation Precautions/Restrictions  Precautions Precautions: Fall Precaution Comments: Helmet when OOB-does not have helmet yet, order placed; verbal orders from MD ok for patient to be OOB until helmet arrives Restrictions Weight Bearing Restrictions: No General   Vital Signs   Pain Pain Assessment Pain Assessment: 0-10 Pain Score: 6  Pain Location: Head Pain Descriptors / Indicators: Aching Pain Intervention(s): Medication (See eMAR) Home Living/Prior Functioning Home Living Available Help at Discharge: Family;Available 24 hours/day Type of Home: House Home Access: Stairs to enter [employer] of Steps: 2 Entrance Stairs-Rails: None (plans to install one before d/c) Home Layout: One level Additional Comments: patient was indepdendent and worked full time in [employer], trade [employer]; patient reports he and his wife may go stay with daughter and son  in Sports [place] in [place] until he is fully recovered  Lives With: Spouse;Other (Comment) (pt and spouse may move to daughters after d/c) Prior Function Level of Independence: Independent with gait;Independent with transfers  Able to Take Stairs?: Yes Driving:  Yes Vocation: Full time employment Vocation Requirements: Working towards part time schedule Comments: Patient worked as an Loss adjuster, chartered AD[employer]   Vision/Perception  Vision- History Baseline Vision/History: Wears glasses Wears Glasses: Reading only Patient Visual Report: No change from baseline Vision- Assessment Vision Assessment?: No apparent visual deficits Eye Alignment: Within Functional Limits Ocular Range of Motion: Within Functional Limits Tracking/Visual Pursuits: Able to track stimulus in all quads without difficulty Saccades: Within functional limits Visual Fields: No apparent deficits  Cognition Overall Cognitive Status: Impaired/Different from baseline Arousal/Alertness: Awake/alert Orientation Level: Oriented X4 Attention: Selective Sustained Attention: Impaired Sustained Attention Impairment: Functional basic;Verbal basic Selective Attention: Impaired Memory: Impaired Memory Impairment: Retrieval deficit;Decreased recall of new information;Decreased short term memory Decreased Short Term Memory: Verbal basic;Functional basic Awareness: Impaired Awareness Impairment: Intellectual impairment Problem Solving: Impaired Problem Solving Impairment: Functional basic Executive Function: Organizing;Sequencing Sequencing: Impaired Organizing: Impaired Safety/Judgment: Impaired [place] [employer] Scales of Cognitive Functioning: Automatic/appropriate Sensation Sensation Light Touch: Appears Intact Proprioception: Appears Intact Coordination Gross Motor Movements are Fluid and Coordinated: Yes Fine Motor Movements are Fluid and Coordinated: Yes Motor  Motor Motor: Within  Functional Limits Mobility  Bed Mobility Bed Mobility: Supine to Sit;Sit to Supine Supine to Sit: 5: Supervision;HOB elevated;With rails Supine to Sit Details: Tactile cues for initiation;Verbal cues for precautions/safety;Verbal cues for technique Sit to Supine: 5: Supervision;HOB flat Sit to Supine - Details: Tactile cues for initiation;Verbal cues for precautions/safety Transfers Sit to Stand: 4: Min assist;With upper extremity assist;With armrests;From bed;From chair/3-in-1 Sit to Stand Details: Verbal cues for precautions/safety;Verbal cues for sequencing;Verbal cues for technique;Tactile cues for weight shifting Stand to Sit: 4: Min assist;With upper extremity assist;With armrests;To bed;To chair/3-in-1 Stand to Sit Details (indicate cue type and reason): Verbal cues for sequencing;Tactile cues for weight shifting;Verbal cues for precautions/safety;Verbal cues for technique  Trunk/Postural Assessment  Cervical Assessment Cervical Assessment: Within Functional Limits Thoracic Assessment Thoracic Assessment: Within Functional Limits Lumbar Assessment Lumbar Assessment: Within Functional Limits Postural Control Righting Reactions: delayed Protective Responses: delayed  Balance Balance Balance Assessed: Yes Standardized Balance Assessment Standardized Balance Assessment: Berg Balance Test Berg Balance Test Sit to Stand: Needs minimal aid to stand or to stabilize Standing Unsupported: Able to stand 2 minutes with supervision Sitting with Back Unsupported but Feet Supported on Floor or Stool: Able to sit safely and securely 2 minutes Stand to Sit: Uses backs of legs against chair to control descent Transfers: Needs one person to assist Standing Unsupported with Eyes Closed: Able to stand 10 seconds with supervision Standing Ubsupported with Feet Together: Able to place feet together independently but unable to hold for 30 seconds From Standing, Reach Forward with Outstretched Arm:  Reaches forward but needs supervision From Standing Position, Pick up Object from Floor: Unable to pick up and needs supervision From Standing Position, Turn to Look Behind Over each Shoulder: Turn sideways only but maintains balance Turn 360 Degrees: Needs assistance while turning Standing Unsupported, Alternately Place Feet on Step/Stool: Able to complete >2 steps/needs minimal assist Standing Unsupported, One Foot in Front: Loses balance while stepping or standing Standing on One Leg: Unable to try or needs assist to prevent fall Total Score: 21 Static Sitting Balance Static Sitting - Balance Support: Bilateral upper extremity supported;Feet supported Static Sitting - Level of Assistance: 6: Modified independent (Device/Increase time) Dynamic Sitting Balance Dynamic Sitting - Balance Support: Feet supported;No upper extremity supported Dynamic Sitting - Level of Assistance: 5: Stand by assistance Dynamic Sitting - Balance Activities: Forward lean/weight shifting  Static Standing Balance Static Standing - Balance Support: Bilateral upper extremity supported;During functional activity Static Standing - Level of Assistance: 4: Min assist Static Standing - Comment/# of Minutes: During donning of brief Extremity/Trunk Assessment RUE Assessment RUE Assessment: Within Functional Limits LUE Assessment LUE Assessment: Within Functional Limits  FIM:  FIM - Grooming Grooming Steps: Wash, rinse, dry face;Wash, rinse, dry hands;Oral care, brush teeth, clean dentures Grooming: 4: Patient completes 3 of 4 or 4 of 5 steps FIM - Bathing Bathing Steps Patient Completed: Chest;Right Arm;Abdomen;Left upper leg;Right upper leg;Left Arm;Buttocks;Front perineal area Bathing: 4: Min-Patient completes 8-9 [id number] parts or 75+ percent FIM - Upper Body Dressing/Undressing Upper body dressing/undressing steps patient completed: Thread/unthread right sleeve of pullover shirt/dresss;Thread/unthread left sleeve of  pullover shirt/dress;Put head through opening of pull over shirt/dress;Pull shirt over trunk Upper body dressing/undressing: 4: Min-Patient completed 75 plus % of tasks (min assist to manage over staples) FIM - Lower Body Dressing/Undressing Lower body dressing/undressing steps patient completed: Thread/unthread right underwear leg;Thread/unthread left underwear leg;Pull underwear up/down;Thread/unthread left pants leg;Pull pants up/down;Don/Doff right sock;Don/Doff left sock;Don/Doff left shoe;Don/Doff right shoe;Fasten/unfasten right shoe;Fasten/unfasten left shoe Lower body dressing/undressing: 4: Min-Patient completed 75 plus % of tasks FIM - BEngineer, siteAssistive Devices: HOB elevated;Bed rails;Arm rests Bed/Chair Transfer: 5: Supine > Sit: Supervision (verbal cues/safety issues);5: Sit > Supine: Supervision (verbal cues/safety issues);4: Bed > Chair or W/C: Min A (steadying Pt. > 75%);4: Chair or W/C > Bed: Min A (steadying Pt. > 75%) FIM - TRadio producerDevices: WMining engineerTransfers: 4-To toilet/BSC: Min A (steadying Pt. > 75%);4-From toilet/BSC: Min A (steadying Pt. > 75%)   Refer to Care Plan for Long Term Goals  Recommendations for other services: None  Discharge Criteria: Patient will be discharged from OT if patient refuses treatment 3 consecutive times without medical reason, if treatment goals not met, if there is a change in medical status, if patient makes no progress towards goals or if patient is discharged from hospital.  The above assessment, treatment plan, treatment alternatives and goals were discussed and mutually agreed upon: by patient  KDuayne Cal6/01/2014, 11:51 AM

## 2014-05-05 NOTE — Progress Notes (Signed)
PMR Admission Coordinator Pre-Admission Assessment  Patient: Alejandro [name] is an 67 y.o., male  MRN: [id number]  DOB: 1945/12/16  Height: _0  (172.7 cm)  Weight: 104.282 kg (229 lb 14.4 oz)  Insurance Information  HMO: PPO: yes PCP: IPA: 80/20: OTHER:  PRIMARY: [employer] [place] Policy#: [id number] Subscriber: self  CM Name: Laney Potash Phone#: [phone] Fax#: [phone]  Updates due to Pat on 6-10 at above fax or phone. Approval given for 10 days through May 13, 2014.  Pre-Cert#: [id number] Employer: working full time at [employer]: Phone #: [phone] Name: Alejandro [name]. Date: 12/03/10 Deduct: $800 (met all) Out of Pocket Max: $3000 (met [id number]) Life Max: Unlimited  CIR: 80/20% (pre-auth needed) SNF: 80/20% (pre-auth needed), visit limit based on medical necessity  Outpatient: 80% Co-Pay: 20% (no copay, 30 visit limit)  Home Health: 80% Co-Pay: 20% (pre-auth needed), visit limit based on medical necessity  DME: 80% Co-Pay: 20%  Providers: in network   Emergency Contact Information    Contact Information     Name  Franklin  [phone]       Cramsor,Chrstie  Daughter  [phone]   [phone]        Current Medical History  Patient Admitting Diagnosis: right temporal contusion/SDH s/p craniotomy and then craniectomy/lobectomy  History of Present Illness: Alejandro [name] is a 68 y.o.right handed male with A fib on chronic coumadin,CAD with CABG alcohol and tobacco abuse. Admitted 04/19/2014 after noted fall with laceration on his head. Cranial CT scan showed subdural hematoma with midline shift, hemorrhagic contusion on the right temporal lobe as well as right matter disease. INR on admission of 2.5 and reversed. Underwent right craniotomy for evacuation of acute subdural hematoma 04/19/2014 per Dr. Saintclair Halsted. Maintained on [facility] for seizure prophylaxis. Initial postoperative CT scan stable with again repeat serial cranial  CT scan 04/23/2014 showing expansion of right temporal intracerebral hematoma and possible venous infarction with temporal lobe swelling. Underwent reexploration of right craniotomy for partial temporal lobectomy and evacuation of right temporal hematoma with implantation of a bone flap in the right lateral abdominal wall cavity 04/23/2014. Patient remained intubated until 04/28/2014. 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 Wound culture Gram stain pending placed on empiric vancomycin as well as Tressie Ellis. Currently on a dysphagia 1 nectar thick liquid diet. Await formal followup physical and occupational therapy. M.D. has requested physical medicine rehabilitation consult.  Past Medical History    Past Medical History    Diagnosis  Date    .  Systolic heart failure     .  CAD (coronary artery disease)       s/p CABGx4 on 12/24/2008    .  Dyslipidemia     .  HTN (hypertension)     .  Atrial fibrillation     .  Cancer       around nose    .  Tobacco abuse     .  Alcohol abuse     .  H/O hiatal hernia      Family History  family history includes Diabetes in his father and mother; Heart disease in his brother, father, and mother. There is no history of Colon cancer or  Prostate cancer.  Prior Rehab/Hospitalizations: none  Current Medications  Current facility-administered medications:0.9 % sodium chloride infusion, 250 mL, Intravenous, PRN, Rahul P Desai, PA-C; acetaminophen (TYLENOL) solution 650 mg, 650 mg, Per Tube, Q4H PRN, Raylene Miyamoto, MD, 650 mg at 04/27/14 1029; antiseptic oral rinse (BIOTENE) solution 15 mL, 15 mL, Mouth Rinse, q12n4p, Rush Farmer, MD, 15 mL at 05/04/14 1222  atorvastatin (LIPITOR) tablet 20 mg, 20 mg, Oral, q1800, Nishant  Dhungel, MD, 20 mg at 05/03/14 1832; carvedilol (COREG) tablet 25 mg, 25 mg, Oral, BID WC, Nishant Dhungel, MD, 25 mg at 05/04/14 0855; chlorhexidine (PERIDEX) 0.12 % solution 15 mL, 15 mL, Mouth Rinse, BID, Rush Farmer, MD, 15 mL at 05/04/14 0855; [START ON 05/09/2014] dexamethasone (DECADRON) tablet 2 mg, 2 mg, Oral, 3 times per day, Nishant Dhungel, MD  [START ON 05/12/2014] dexamethasone (DECADRON) tablet 2 mg, 2 mg, Oral, Q12H, Nishant Dhungel, MD; Derrill Memo ON 05/15/2014] dexamethasone (DECADRON) tablet 2 mg, 2 mg, Oral, Daily, Nishant Dhungel, MD; dexamethasone (DECADRON) tablet 4 mg, 4 mg, Oral, 3 times per day, Nishant Dhungel, MD, 4 mg at 05/04/14 1416; [START ON 05/06/2014] dexamethasone (DECADRON) tablet 4 mg, 4 mg, Oral, Q12H, Nishant Dhungel, MD  diltiazem (CARDIZEM) tablet 60 mg, 60 mg, Oral, 4 times per day, Raylene Miyamoto, MD, 60 mg at 05/04/14 [phone]; feeding supplement (ENSURE COMPLETE) (ENSURE COMPLETE) liquid 237 mL, 237 mL, Oral, BID BM, Heather Cornelison Pitts, RD, 237 mL at 68/34/19 6222; folic acid (FOLVITE) tablet 1 mg, 1 mg, Oral, Daily, Rush Farmer, MD, 1 mg at 05/04/14 1011  insulin aspart (novoLOG) injection 0-9 Units, 0-9 Units, Subcutaneous, TID WC, Nishant Dhungel, MD, 3 Units at 05/04/14 1222; ipratropium-albuterol (DUONEB) 0.5-2.5 (3) MG/3ML nebulizer solution 3 mL, 3 mL, Nebulization, Q6H PRN, Tanda Rockers, MD; labetalol (NORMODYNE,TRANDATE) injection 10-40 mg, 10-40 mg, Intravenous, Q10 min PRN, [name], MD, 20 mg at 04/24/14 9798  levETIRAcetam (KEPPRA) 100 MG/ML solution 500 mg, 500 mg, Oral, BID, Rush Farmer, MD, 500 mg at 05/04/14 1012; levofloxacin (LEVAQUIN) tablet 750 mg, 750 mg, Oral, Daily, Rocky Crafts Hammons, RPH, 750 mg at 05/04/14 1011; multivitamin with minerals tablet 1 tablet, 1 tablet, Oral, Daily, Raylene Miyamoto, MD, 1 tablet at 05/04/14 1011  ondansetron (ZOFRAN) injection 4 mg, 4 mg, Intravenous, Q4H PRN, [name], MD, 4 mg at  04/28/14 1446; ondansetron (ZOFRAN) tablet 4 mg, 4 mg, Oral, Q4H PRN, [name], MD; pantoprazole (PROTONIX) EC tablet 40 mg, 40 mg, Oral, QHS, Charmian Muff [place], RPH, 40 mg at 05/03/14 2129; RESOURCE THICKENUP CLEAR, , Oral, PRN, Raylene Miyamoto, MD  thiamine (VITAMIN B-1) tablet 100 mg, 100 mg, Oral, Daily, Rush Farmer, MD, 100 mg at 05/04/14 1011  Patients Current Diet: Dysphagia level 3 with thin liquids, sit upright with intermittent supervision  Precautions / Restrictions  Precautions  Precautions: Fall  Restrictions  Weight Bearing Restrictions: No  Prior Activity Level  Community (5-7x/wk): pt states he got out nearly everyday, was driving and would go out to get the paper each morning. He was also working at [employer] and had not "quite" retired from his work.  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 He said normally I would get up and get it myself. I asked him if he should get up by himself he said no. When asked why he said he shouldn't mentally. I asked if there was any other reason he should not get up and he said that his legs were weak. I told him yes it was mentally and physically that he should not get up (mentally is remembering he should not get up and leg weakness is the  reason he should not get up)  Attention: Sustained  Sustained Attention: Impaired  Sustained Attention Impairment: Verbal basic;Functional basic  Memory: Impaired  Memory Impairment: Storage deficit;Retrieval deficit;Decreased short term memory  Decreased Short Term Memory: Verbal basic;Functional basic  Awareness: Impaired  Awareness Impairment: Intellectual impairment  Problem Solving: Impaired  Executive Function: Reasoning;Organizing;Decision Making  Reasoning: Impaired  Reasoning Impairment: Verbal basic  Organizing: Impaired  Organizing Impairment: Verbal basic  Decision Making: Impaired  Decision Making Impairment: Verbal basic  Safety/Judgment: Impaired  [place] Scales of Cognitive Functioning: Purposeful/appropriate    Extremity Assessment  (includes Sensation/Coordination)      ADLs  Overall ADL's : Needs assistance/impaired  Eating/Feeding: Set up;Supervision/ safety;Sitting  Grooming: Oral care;Standing;Min guard  Grooming Details (indicate cue type and reason): Cueing to look for the item in his bucket of toiletries that looked like toothpaste since he did not have glasses to actullay read the print on the containers that were in his bucket  Upper Body Bathing: Minimal assitance  Lower Body Bathing: Moderate assistance;Sit to/from stand  Upper Body Dressing : Moderate assistance;Sitting  Lower Body Dressing: Min guard;Sit to/from stand  Lower Body Dressing Details (indicate cue type and reason): able to don/doff socks in sitting with supervision. Requires assist with balance and pulling pants over hips  Toilet Transfer: Minimal assistance (recliner> around bed to sink>recliner)  Toileting- Clothing Manipulation and Hygiene: Moderate assistance  Toileting - Clothing Manipulation Details (indicate cue type and reason): foley  Functional mobility during ADLs: Minimal assistance  General ADL Comments: Functional mobility and ADL affected by general weakness and  cognitive deficits.    Mobility  Overal bed mobility: Needs Assistance  Bed Mobility: Supine to Sit  Supine to sit: Min assist  General bed mobility comments: use of rail and min assist to pull up with 1 UE    Transfers  Overall transfer level: Needs assistance  Equipment used: Rolling walker (2 wheeled)  Transfers: Sit to/from Stand  Sit to Stand: Min assist;Mod assist  Stand pivot transfers: Mod assist  General transfer comment: mod assist from low toilet, cues for UE use and rail.    Ambulation / Gait / Stairs / Wheelchair Mobility  Ambulation/Gait  Ambulation/Gait assistance: Fish farm manager (Feet): 75 Feet  Assistive device: Rolling walker (2 wheeled)  Gait Pattern/deviations: Step-to pattern;Step-through pattern;Staggering left;Staggering right;Decreased weight shift to right;Decreased weight shift to left  Gait velocity: slow  General Gait Details: more steady assist to turn around, side step and maneuvre around obstacles.    Posture / Balance  Dynamic Sitting Balance  Sitting balance - Comments: sitting is static    Special needs/care consideration  BiPAP/CPAP no  CPM no  Continuous Drip IV no  Dialysis no  Life Vest no  Oxygen no  Special Bed no  Trach Size no  Wound Vac (area) no  Skin - current healing incision with staples along R craniectomy site with R bone flap out Bowel mgmt: last BM on 05-03-14  Bladder mgmt: with current foley catheter  Diabetic mgmt no    Previous Home Environment  Living Arrangements: Spouse/significant other  Lives With: Spouse  Home Care Services: No  Additional Comments: pt was indep and worked full time in Research officer, 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 Discharge Living Setting: Patient's home  Type of Home at Discharge: House  Discharge Home Layout: One level  Discharge Home Access: Stairs to enter  Entrance Stairs-Rails: None  Entrance Stairs-Number of Steps: 3  Does the patient have any  problems obtaining your medications?: No  Social/Family/Support Systems  Patient Roles: Spouse  Contact Information: wife Pamala Hurry is primary contact  Anticipated Caregiver: wife  Anticipated Caregiver's Contact Information: see above  Ability/Limitations of Caregiver: no limitations  Caregiver Availability: 24/7 (wife does help with granddtr but can be avail for pt 24-7)  Discharge Plan Discussed with Primary Caregiver: Yes  Is Caregiver In Agreement with Plan?: Yes  Does Caregiver/Family have Issues with Lodging/Transportation while Pt is in Rehab?: No  Goals/Additional Needs  Patient/Family Goal for Rehab: Supervision for PT/OT and minimal to moderate assist with SLP  Expected length of stay: 13-18 days  Cultural Considerations: Christian faith  Dietary Needs: dys 3, mechanical soft with thin liquids. (Sit upright with intermittent supervision)  Equipment Needs: to be determined  Pt/Family Agrees to Admission and willing to participate: Yes (spoke with pt's wife by phone on 5-29, 6-1 and 6-2)  Program Orientation Provided & Reviewed with Pt/Caregiver Including Roles & Responsibilities: Yes  Decrease burden of Care through IP rehab admission: NA  Possible need for SNF placement upon discharge: not anticipated  Patient Condition: This patient's medical and functional status has changed since the consult dated: 04-29-14 in which the Rehabilitation Physician determined and documented that the patient's condition is appropriate for intensive rehabilitative care in an inpatient rehabilitation facility. See "History of Present Illness" (above) for medical update. Functional changes are: minimal assistance to ambulate 75' and minimal to moderate assistance with ADLs. Patient's medical and functional status update has been discussed with the Rehabilitation physician and patient remains appropriate for inpatient rehabilitation. Will admit to inpatient rehab today.  Preadmission Screen Completed By:  Ave Filter, PT 05/04/2014 3:02 PM  ______________________________________________________________________  Discussed status with Dr. Naaman Plummer on 05-04-14 at 1505 and received telephone approval for admission today.  Admission Coordinator: Ave Filter, PT time 1505/Date 05-04-14    Cosigned by: Meredith Staggers, MD [05/04/2014 4:04 PM]

## 2014-05-05 NOTE — Progress Notes (Signed)
Physical Therapy Session Note  Patient Details  Name: Alejandro [name] MRN: [id number] Date of Birth: 29-Aug-1946  Today's Date: 05/05/2014 Time: 1635-1700 Time Calculation (min): 25 min  Skilled Therapeutic Interventions/Progress Updates:  1:1. Pt received sitting in w/c, ready for therapy. Pt propelled w/c room>therapy gym w/ supervision, cues for propulsion technique. Focus this session on ambulation with trial of various ADs. Pt able to demonstrate safe and controlled ambulation x150 w/ RW and min guard-min A. Trial HHA and SPC, 100' each, with pt req mod A and frequently reaching out to stabilize self with rail or wall. Gait characterized by anterior/R lean, wide BOS and non-fluid dysmetric steps. Pt demonstrating good safety insight stating, "I think I do the best with the walker." Pt able to safely ambulate back to room w/ RW and min guard- minA. Pt sitting in w/c at end of session w/ all needs in reach, quick release belt in place and wife in room.   Therapy Documentation Precautions:  Precautions Precautions: Fall Precaution Comments: Helmet when OOB-does not have helmet yet, order placed; verbal orders from MD ok for patient to be OOB until helmet arrives Restrictions Weight Bearing Restrictions: No  See FIM for current functional status  Therapy/Group: Individual Therapy  Gilmore Laroche 05/05/2014, 5:08 PM

## 2014-05-05 NOTE — Progress Notes (Signed)
Physical Therapy Session Note  Patient Details  Name: Alejandro [name] MRN: [id number] Date of Birth: June 26, 1946  Today's Date: 05/05/2014 Time: 0930-1030 Time Calculation (min): 60 min  Short Term Goals: Week 1:  PT Short Term Goal 1 (Week 1): STGs=LTGs secondary to ELOS  Skilled Therapeutic Interventions/Progress Updates:  Pt resting in bed after PT evaluation.  Performed supine > sit supervision and min-mod A for sit > stand and stand pivot bed > w/c <> mat with HHA for balance and verbal cues for sequencing.  Pt performed w/c mobility x 150' with supervision but continued to require mod-max verbal cues for sequencing with decreased memory of navigation to gym.  Performed BERG balance assessment; see below for details.   Pt educated on high falls risk and activities that may place pt at increased risk for falling (changing direction, stepping over shower, stairs, etc.)  Pt verbalized understanding.  Returned to room and pt encouraged to stay in w/c with quick release belt for B&D immediately following.    Therapy Documentation Precautions:  Precautions Precautions: Fall Precaution Comments: Helmet when OOB-does not have helmet yet, order placed; verbal orders from MD ok for patient to be OOB until helmet arrives Restrictions Weight Bearing Restrictions: No Pain: Pain Assessment Pain Assessment: 0-10 Pain Score: 6  Pain Location: Head Pain Descriptors / Indicators: Aching Pain Intervention(s): Medication (See eMAR) Balance: Balance Balance Assessed: Yes Standardized Balance Assessment Standardized Balance Assessment: Berg Balance Test Berg Balance Test Sit to Stand: Needs minimal aid to stand or to stabilize Standing Unsupported: Able to stand 2 minutes with supervision Sitting with Back Unsupported but Feet Supported on Floor or Stool: Able to sit safely and securely 2 minutes Stand to Sit: Uses backs of legs against chair to control descent Transfers: Needs one person to  assist Standing Unsupported with Eyes Closed: Able to stand 10 seconds with supervision Standing Ubsupported with Feet Together: Able to place feet together independently but unable to hold for 30 seconds From Standing, Reach Forward with Outstretched Arm: Reaches forward but needs supervision From Standing Position, Pick up Object from Floor: Unable to pick up and needs supervision From Standing Position, Turn to Look Behind Over each Shoulder: Turn sideways only but maintains balance Turn 360 Degrees: Needs assistance while turning Standing Unsupported, Alternately Place Feet on Step/Stool: Able to complete >2 steps/needs minimal assist Standing Unsupported, One Foot in Front: Loses balance while stepping or standing Standing on One Leg: Unable to try or needs assist to prevent fall Total Score: 21 Patient demonstrates increased fall risk as noted by score of 21/56 on Berg Balance Scale.  (<36= high risk for falls, close to 100%; 37-45 significant >80%; 46-51 moderate >50%; 52-55 lower >25%)  See FIM for current functional status  Therapy/Group: Individual Therapy  Malachy Mood 05/05/2014, 10:35 AM

## 2014-05-05 NOTE — Evaluation (Addendum)
Physical Therapy Assessment and Plan  Patient Details  Name: Alejandro [name] MRN: [id number] Date of Birth: 1946/10/31  PT Diagnosis: Abnormality of gait, Cognitive deficits, Impaired cognition and Pain in head Rehab Potential:  Excellent ELOS: 7-10 days   Today's Date: 05/05/2014 Time: 0800-0900 Time Calculation (min): 60 min  Problem List:  Patient Active Problem List   Diagnosis Date Noted  . UTI (urinary tract infection) 05/03/2014  . Acute delirium 04/23/2014  . Alcohol withdrawal 04/23/2014  . Subdural hematoma 04/19/2014  . SDH (subdural hematoma) 04/19/2014  . Altered mental status 04/19/2014  . Accelerated hypertension 04/19/2014  . Encounter for therapeutic drug monitoring 02/09/2014  . Recurrent ventral hernia 10/22/2012  . Skin lesion 09/26/2012  . Hernia 09/26/2012  . SYSTOLIC HEART FAILURE, CHRONIC 06/15/2009  . SYSTOLIC HEART FAILURE, ACUTE ON CHRONIC 01/28/2009  . HYPERLIPIDEMIA-MIXED 01/27/2009  . ATRIAL FIBRILLATION 01/27/2009  . CAD, ARTERY BYPASS GRAFT 12/23/2008    Past Medical History:  Past Medical History  Diagnosis Date  . Systolic heart failure   . 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 Dyslipidemia   . HTN (hypertension)   . Atrial fibrillation   . Cancer     around nose  . Tobacco abuse   . Alcohol abuse   . H/O hiatal hernia    Past Surgical History:  Past Surgical History  Procedure Laterality Date  . Coronary artery bypass graft  12/24/2008    4 vessel  . Umbilical hernia repair  2010  . Hernia repair  2012  . Coronary artery bypass graft  2012  . Ventral hernia repair N/A 01/22/2013    Procedure: HERNIA REPAIR VENTRAL ADULT;  Surgeon: [name], MD;  Location: [facility];  Service: General;  Laterality: N/A;  . Application of a-cell of chest/abdomen N/A 01/22/2013    Procedure: APPLICATION OF A-CELL OF CHEST/ABDOMEN;  Surgeon: [name], MD;  Location: [facility];  Service: General;  Laterality: N/A;  .  Cystoscopy N/A 01/22/2013    Procedure: Consuela Mimes;  Surgeon: [name], MD;  Location: [facility];  Service: Urology;  Laterality: N/A;  Cystoscopy with balloon dilation. Insertion of coude catheter.  . Craniotomy Right 04/19/2014    Procedure: CRANIOTOMY HEMATOMA EVACUATION SUBDURAL;  Surgeon: [name], MD;  Location: [facility];  Service: Neurosurgery;  Laterality: Right;  right  . Craniotomy Right 04/23/2014    Procedure: Craniotomy for Intracerebral Hemorrhage;  Surgeon: [name], MD;  Location: [facility];  Service: Neurosurgery;  Laterality: Right;    Assessment & Plan Clinical Impression:Alejandro [name] is a 68 y.o.right handed male with A fib on chronic coumadin,CAD with CABG, alcohol and tobacco abuse. Admitted 04/19/2014 after noted fall with laceration on his head. Cranial CT scan showed subdural hematoma with midline shift, hemorrhagic contusion on the right temporal lobe as well as right matter disease. INR on admission of 2.5 and reversed. Underwent right craniotomy for evacuation of acute subdural hematoma 04/19/2014 per Dr. Saintclair Halsted. Maintained on [place] for seizure prophylaxis. Initial postoperative CT scan stable with again repeat serial cranial CT scan 04/23/2014 showing expansion of right temporal intracerebral hematoma and possible venous infarction with temporal lobe swelling. Underwent reexploration of right craniotomy for partial temporal lobectomy and evacuation of right temporal hematoma with implantation of a bone flap in the right lateral abdominal wall cavity 04/23/2014. Patient remained intubated until 04/28/2014 and follow up per critical care services. Noted wound  with some clear drainage out of one area posterior aspect of incision suspicious for CSF that has since resolved . Maintained on steroid protocol with taper. Wound culture Gram stain no organisms seen. A urine study 04/28/2014 greater than 100,000 Pseudomonas maintained on Levaquin. . Currently on mechanical soft  thin liquid diet. Followup physical and occupational therapy evaluations with recommendations for physical medicine rehabilitation consult. Patient was admitted for comprehensive rehabilitation program. Patient transferred to CIR on 05/04/2014 .   Patient currently requires min with mobility secondary to na, decreased cardiorespiratoy endurance, impaired timing and sequencing and decreased coordination, na, na, decreased attention, decreased awareness, decreased problem solving, decreased memory and delayed processing and decreased standing balance, decreased postural control and decreased balance strategies.  Prior to hospitalization, patient was independent  with mobility and lived with Spouse;Other (Comment) (pt and spouse may move to daughters after d/c) in a House home.  Home access is 2Stairs to enter.  Patient will benefit from skilled PT intervention to maximize safe functional mobility, minimize fall risk and decrease caregiver burden for planned discharge home with wife available 24/7, however, anticipate patient will reach mod I level within the  home and only require supervision in the community or when negotiating stairs.  Anticipate patient will benefit from follow up OP at discharge.  PT - End of Session Activity Tolerance: Tolerates 30+ min activity with multiple rests Endurance Deficit: Yes Endurance Deficit Description: fatigues quickly PT Assessment Rehab Potential: Excellent PT Patient demonstrates impairments in the following area(s): Balance;Endurance;Motor;Perception;Safety PT Transfers Functional Problem(s): Bed Mobility;Bed to Chair;Car;Furniture;Floor PT Locomotion Functional Problem(s): Ambulation;Wheelchair Mobility;Stairs PT Plan PT Intensity: Minimum of 1-2 x/day ,45 to 90 minutes PT Frequency: 5 out of 7 days PT Duration Estimated Length of Stay: 7-10 days PT Treatment/Interventions: Ambulation/gait training;Disease management/prevention;Pain management;Stair  training;Wheelchair propulsion/positioning;Therapeutic Activities;Patient/family education;Balance/vestibular training;DME/adaptive equipment instruction;Cognitive remediation/compensation;Psychosocial support;Therapeutic Exercise;UE/LE Strength taining/ROM;Skin care/wound management;Functional mobility training;Community reintegration;Discharge planning;Neuromuscular re-education;Splinting/orthotics;UE/LE Coordination activities PT Transfers Anticipated Outcome(s): mod I PT Locomotion Anticipated Outcome(s): mod I household, supervision community PT Recommendation Recommendations for Other Services: Speech consult Follow Up Recommendations: Outpatient PT Patient destination: Home Equipment Recommended: To be determined Equipment Details: Patient owns cane; further recommendations TBD upon discharge  Skilled Therapeutic Intervention Verbal orders from MD today, 05/05/14, that patient ok to be OOB without helmet. Order has been placed for helmet and patient to have helmet when OOB once it arrives. Skilled therapeutic intervention initiated after completion of evaluation. Discussed falls risk, safety within room, and focus of therapy during stay. Discussed possible LOS, goals, and f/u therapy.  Patient demonstrates good awareness with regards to discharge planning (patient and wife may d/c to daughter's house in [place] until patient makes full recovery) and insight into deficits. Patient demonstrates difficulty with short term memory/carry over with mobility techniques and requires min cues for proper sequencing/technique with mobility.  PT Evaluation Precautions/Restrictions Precautions Precautions: Fall Precaution Comments: Helmet when OOB-does not have helmet yet, order placed; verbal orders from MD ok for patient to be OOB until helmet arrives Restrictions Weight Bearing Restrictions: No General Chart Reviewed: Yes Family/Caregiver Present: No  Pain Pain Assessment Pain Assessment:  No/denies pain Pain Score: 0-No pain Home Living/Prior Functioning Home Living Available Help at Discharge: Family;Available 24 hours/day Type of Home: House Home Access: Stairs to enter [employer] of Steps: 2-3 Entrance Stairs-Rails: None (reports son in law will be installing handrail) Home Layout: One level Additional Comments: patient was indepdendent and worked full time in Research officer, trade union; patient reports he and his wife may  go stay with daughter and son in law in [place] until he is fully recovered  Lives With: Spouse Pamala Hurry) Prior Function Level of Independence: Independent with gait;Independent with transfers  Able to Take Stairs?: Yes Driving: Yes Vocation: Full time employment Vocation Requirements: Working towards part time schedule Comments: Patient worked as an Pharmacist, hospital with Development worker, community Vision/Perception  Vision - Risk analyst: Within Passenger transport manager Range of Motion: Within Functional Limits Tracking/Visual Pursuits: Able to track stimulus in all quads without difficulty Saccades: Within functional limits  Wears reading glasses No change in vision from baseline Perception/Praxis: WFL Cognition Overall Cognitive Status: Impaired/Different from baseline Arousal/Alertness: Awake/alert Orientation Level: Oriented X4 (with use of external aids) Attention: Selective Sustained Attention: Impaired Sustained Attention Impairment: Functional basic;Verbal basic Selective Attention: Impaired Selective Attention Impairment: Functional basic Memory: Impaired Memory Impairment: Retrieval deficit;Decreased recall of new information;Decreased short term memory Decreased Short Term Memory: Verbal basic;Functional basic Awareness: Impaired Awareness Impairment: Intellectual impairment Problem Solving: Impaired Executive Function: Sequencing;Organizing Sequencing: Impaired Organizing: Impaired Safety/Judgment:  Impaired [employer] Scales of Cognitive Functioning: Purposeful/appropriate Sensation Sensation Light Touch: Appears Intact Proprioception: Appears Intact Coordination Gross Motor Movements are Fluid and Coordinated: Yes Fine Motor Movements are Fluid and Coordinated: Yes Motor  Motor Motor: Within Functional Limits  Mobility Bed Mobility Bed Mobility: Supine to Sit;Sit to Supine Supine to Sit: 5: Supervision;HOB elevated;With rails Supine to Sit Details: Tactile cues for initiation;Verbal cues for precautions/safety;Verbal cues for technique Sit to Supine: 5: Supervision;HOB flat Sit to Supine - Details: Tactile cues for initiation;Verbal cues for precautions/safety Transfers Transfers: Yes Sit to Stand: 4: Min assist;With upper extremity assist;With armrests;From bed;From chair/3-in-1 Sit to Stand Details: Verbal cues for precautions/safety;Verbal cues for sequencing;Verbal cues for technique;Tactile cues for weight shifting Stand to Sit: 4: Min assist;With upper extremity assist;With armrests;To bed;To chair/3-in-1 Stand to Sit Details (indicate cue type and reason): Verbal cues for sequencing;Tactile cues for weight shifting;Verbal cues for precautions/safety;Verbal cues for technique Stand Pivot Transfers: 4: Min assist;With armrests Stand Pivot Transfer Details: Verbal cues for sequencing;Tactile cues for weight shifting;Verbal cues for precautions/safety;Verbal cues for technique Locomotion  Ambulation Ambulation: Yes Ambulation/Gait Assistance: 4: Min assist Ambulation Distance (Feet): 90 Feet Assistive device: Rolling walker Ambulation/Gait Assistance Details: Tactile cues for posture;Verbal cues for gait pattern;Verbal cues for precautions/safety Ambulation/Gait Assistance Details: Gait training in controlled environment 90' x1 with RW and minA. Attempted gait without AD, but patietn states he "feels wobbly" and would prefer using RW. Gait Gait: Yes Gait Pattern:  Impaired Gait Pattern: Step-through pattern;Decreased stride length;Narrow base of support;Trunk flexed;Shuffle Stairs / Additional Locomotion Stairs: Yes Stairs Assistance: 4: Min assist Stairs Assistance Details: Verbal cues for sequencing;Verbal cues for technique;Verbal cues for precautions/safety;Visual cues/gestures for precautions/safety Stair Management Technique: Two rails;Forwards;Step to pattern Number of Stairs: 5 Height of Stairs: 6 Wheelchair Mobility Wheelchair Mobility: Yes Wheelchair Assistance: 4: Advertising account executive Details: Tactile cues for placement;Verbal cues for sequencing;Verbal cues for technique;Verbal cues for precautions/safety;Visual cues for safe use of DME/AE Wheelchair Propulsion: Both upper extremities Wheelchair Parts Management: Needs assistance Distance: 60  Trunk/Postural Assessment  Cervical Assessment Cervical Assessment: Within Functional Limits Thoracic Assessment Thoracic Assessment: Within Functional Limits Lumbar Assessment Lumbar Assessment: Within Functional Limits Postural Control Righting Reactions: delayed Protective Responses: delayed  Balance Balance Balance Assessed: Yes Static Sitting Balance Static Sitting - Balance Support: Bilateral upper extremity supported;Feet supported Static Sitting - Level of Assistance: 6: Modified independent (Device/Increase time) Dynamic Sitting Balance Dynamic Sitting - Balance Support:  Feet supported;No upper extremity supported Dynamic Sitting - Level of Assistance: 5: Stand by assistance Dynamic Sitting - Balance Activities: Forward lean/weight shifting Static Standing Balance Static Standing - Balance Support: Bilateral upper extremity supported;During functional activity Static Standing - Level of Assistance: 4: Min assist Static Standing - Comment/# of Minutes: During donning of brief Extremity Assessment  RLE Assessment RLE Assessment: Within Functional Limits LLE  Assessment LLE Assessment: Within Functional Limits  FIM:  FIM - Engineer, site Assistive Devices: HOB elevated;Bed rails;Arm rests Bed/Chair Transfer: 5: Supine > Sit: Supervision (verbal cues/safety issues);5: Sit > Supine: Supervision (verbal cues/safety issues);4: Bed > Chair or W/C: Min A (steadying Pt. > 75%);4: Chair or W/C > Bed: Min A (steadying Pt. > 75%) FIM - Locomotion: Wheelchair Distance: 60 Locomotion: Wheelchair: 2: Travels 50 - 149 ft with minimal assistance (Pt.>75%) FIM - Locomotion: Ambulation Locomotion: Ambulation Assistive Devices: Administrator Ambulation/Gait Assistance: 4: Min assist Locomotion: Ambulation: 2: Travels 50 - 149 ft with minimal assistance (Pt.>75%) FIM - Locomotion: Stairs Locomotion: Scientist, physiological: Hand rail - 2 Locomotion: Stairs: 2: Up and Down 4 - 11 stairs with minimal assistance (Pt.>75%)   Refer to Care Plan for Long Term Goals  Recommendations for other services: None  Discharge Criteria: Patient will be discharged from PT if patient refuses treatment 3 consecutive times without medical reason, if treatment goals not met, if there is a change in medical status, if patient makes no progress towards goals or if patient is discharged from hospital.  The above assessment, treatment plan, treatment alternatives and goals were discussed and mutually agreed upon: by patient  Lillia Abed. Reis Goga, PT, DPT 05/05/2014, 12:32 PM

## 2014-05-06 ENCOUNTER — Inpatient Hospital Stay (HOSPITAL_COMMUNITY): Payer: BC Managed Care – PPO | Admitting: Speech Pathology

## 2014-05-06 ENCOUNTER — Inpatient Hospital Stay (HOSPITAL_COMMUNITY): Payer: BC Managed Care – PPO | Admitting: *Deleted

## 2014-05-06 ENCOUNTER — Inpatient Hospital Stay (HOSPITAL_COMMUNITY): Payer: BC Managed Care – PPO

## 2014-05-06 ENCOUNTER — Encounter (HOSPITAL_COMMUNITY): Payer: BC Managed Care – PPO

## 2014-05-06 LAB — GLUCOSE, CAPILLARY
GLUCOSE-CAPILLARY: 137 mg/dL — AB (ref 70–99)
Glucose-Capillary: 138 mg/dL — ABNORMAL HIGH (ref 70–99)
Glucose-Capillary: 212 mg/dL — ABNORMAL HIGH (ref 70–99)
Glucose-Capillary: 181 mg/dL — ABNORMAL HIGH (ref 70–99)

## 2014-05-06 NOTE — IPOC Note (Signed)
Overall Plan of Care [facility]) Patient Details Name: Alejandro [name] MRN: [id number] DOB: Feb 03, 1946  Admitting Diagnosis: R SDH hemicraniotomy  hemicraniectomy  Hospital Problems: Active Problems:   SDH (subdural hematoma)     Functional Problem List: Nursing Bladder;Safety;Skin Integrity  PT Balance;Endurance;Motor;Perception;Safety  OT Balance;Cognition;Endurance;Motor;Safety;Perception  SLP    TR         Basic ADL's: OT Grooming;Bathing;Dressing;Toileting     Advanced  ADL's: OT Simple Meal Preparation     Transfers: PT Bed Mobility;Bed to Chair;Car;Furniture;Floor  OT Toilet;Tub/Shower     Locomotion: PT Ambulation;Wheelchair Mobility;Stairs     Additional Impairments: OT None  SLP Swallowing;Social Cognition   Social Interaction;Problem Solving;Memory;Awareness;Attention  TR      Anticipated Outcomes Item Anticipated Outcome  Self Feeding n/a  Swallowing  Mod I with least restrictive diet   Basic self-care  Mod I   Toileting  Mod I   Bathroom Transfers Mod I  Bowel/Bladder  mod I  Transfers  mod I  Locomotion  mod I household, supervision community  Communication     Cognition  Supervision-Mod I   Pain  < 3  Safety/Judgment  Min   Therapy Plan: PT Intensity: Minimum of 1-2 x/day ,45 to 90 minutes PT Frequency: 5 out of 7 days PT Duration Estimated Length of Stay: 7-10 days OT Intensity: Minimum of 1-2 x/day, 45 to 90 minutes OT Frequency: 5 out of 7 days OT Duration/Estimated Length of Stay: 7-10 days  SLP Intensity: Minumum of 1-2 x/day, 30 to 90 minutes SLP Frequency: 5 out of 7 days SLP Duration/Estimated Length of Stay: 7-10 days        Team Interventions: Nursing Interventions Patient/Family Education;Bladder Management;Disease Management/Prevention;Pain Management;Skin Care/Wound Management;Cognitive Remediation/Compensation;Dysphagia/Aspiration Precaution Training  PT interventions Ambulation/gait training;Disease  management/prevention;Pain Landscape architect;Wheelchair propulsion/positioning;Therapeutic Activities;Patient/family education;Balance/vestibular training;DME/adaptive equipment instruction;Cognitive remediation/compensation;Psychosocial support;Therapeutic Exercise;UE/LE Strength taining/ROM;Skin care/wound management;Functional mobility training;Community reintegration;Discharge planning;Neuromuscular re-education;Splinting/orthotics;UE/LE Coordination activities  OT Interventions Balance/vestibular training;Cognitive remediation/compensation;Community reintegration;Discharge planning;DME/adaptive equipment instruction;Functional mobility training;Neuromuscular re-education;Patient/family education;Psychosocial support;Self Care/advanced ADL retraining;Therapeutic Activities;UE/LE Strength taining/ROM;Therapeutic Exercise;UE/LE Coordination activities  SLP Interventions Cognitive remediation/compensation;Cueing hierarchy;Dysphagia/aspiration precaution training;Functional tasks;Patient/family education;Therapeutic Activities;Internal/external aids;Environmental controls;Therapeutic Exercise  TR Interventions    SW/CM Interventions      Team Discharge Planning: Destination: PT-Home ,OT- Home , SLP-Home Projected Follow-up: PT-Outpatient PT, OT-  Other (comment) (TBD), SLP-Outpatient SLP;24 hour supervision/assistance Projected Equipment Needs: PT-To be determined, OT- Tub/shower seat, SLP-None recommended by SLP Equipment Details: PT-Patient owns cane; further recommendations TBD upon discharge, OT-  Patient/family involved in discharge planning: PT- Patient,  OT-Patient, SLP-Patient;Family member/caregiver  MD ELOS: 7-10 days Medical Rehab Prognosis:  Excellent Assessment: The patient has been admitted for CIR therapies with the diagnosis of TBI with craniectomy. The team will be addressing functional mobility, strength, stamina, balance, safety, adaptive techniques and equipment,  self-care, bowel and bladder mgt, patient and caregiver education, NMR, craniectomy safety/helmet, cognitive perceptual awarness, communication. Goals have been set at mod I for mobility and self-care and supervision/mod I with cognition.   Meredith Staggers, MD, FAAPMR      See Team Conference Notes for weekly updates to the plan of care

## 2014-05-06 NOTE — Progress Notes (Signed)
Notes reviewed and this therapist agrees with information provided.

## 2014-05-06 NOTE — Progress Notes (Signed)
[facility]     PROGRESS NOTE    Subjective/Complaints: Had a good day. No complaints. Received helmet and it fits appropriately.   Objective: Vital Signs: Blood pressure 148/69, pulse 60, temperature 98.2 F (36.8 C), temperature source Oral, resp. rate 18, weight 47.446 kg (104 lb 9.6 oz), SpO2 98.00%. No results found.  Recent Labs  05/05/14 0515  WBC 13.1*  HGB 10.9*  HCT 31.2*  PLT 303    Recent Labs  05/05/14 0515  NA 131*  K 4.9  CL 97  GLUCOSE 144*  BUN 28*  CREATININE 0.90  CALCIUM 8.3*   CBG (last 3)   Recent Labs  05/05/14 1613 05/05/14 2133 05/06/14 0731  GLUCAP 175* 216* 138*    Wt Readings from Last 3 Encounters:  05/05/14 47.446 kg (104 lb 9.6 oz)  05/04/14 104.282 kg (229 lb 14.4 oz)  05/04/14 104.282 kg (229 lb 14.4 oz)    Physical Exam:  Sitting in chair. No distress. obese  Eyes:  Pupils round and reactive to light  Mouth: edentulous, mucosa pink and moist  Head: craniectomy site sunken in. No drainage. Wound well approximated  Neck: Normal range of motion. Neck supple. No thyromegaly present.  Cardiovascular:  Cardiac rate controlled without murmur  Respiratory: Effort normal and breath sounds normal. No respiratory distress.  GI: Soft. Bowel sounds are normal. He exhibits no distension.  Flap site dressed/intact. Minimally tender Neurological: He is alert.  Makes good eye contact with examiner. Far left upper quadrant of vision may be affected. No neglect. appropriate for name, place, age. Bilat mittens in place.Followed simple commands. Moves all 4 limbs with ease. Can follow simple commands. Improved insight and awareness. Recalls  biographical information. Non-impulsive. Sensory exam intact. dtr's 1+.  Skin:  Intact other than surgical sites  Psych: pleasant, cooperative, non-impulsive, non-anxious   Assessment/Plan: 1. 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 Physiatrist is providing close team supervision and 24 hour management of active medical problems listed below. Physiatrist and rehab team continue to assess barriers to discharge/monitor patient progress toward functional and medical goals. FIM: FIM - Bathing Bathing Steps Patient Completed: Chest;Right Arm;Abdomen;Left upper leg;Right upper leg;Left Arm;Buttocks;Front perineal area Bathing: 4: Min-Patient completes 8-9 47f 10 parts or 75+ percent  FIM - Upper Body Dressing/Undressing Upper body dressing/undressing steps patient completed: Thread/unthread right sleeve of pullover shirt/dresss;Thread/unthread left sleeve of pullover shirt/dress;Put head through opening of pull over shirt/dress;Pull shirt over trunk Upper body dressing/undressing: 4: Min-Patient completed 75 plus % of tasks (min assist to manage over staples) FIM - Lower Body Dressing/Undressing Lower body dressing/undressing steps patient completed: Thread/unthread right underwear leg;Thread/unthread left underwear leg;Pull underwear up/down;Thread/unthread left pants leg;Pull pants up/down;Don/Doff right sock;Don/Doff left sock;Don/Doff left shoe;Don/Doff right shoe;Fasten/unfasten right shoe;Fasten/unfasten left shoe Lower body dressing/undressing: 4: Min-Patient completed 75 plus % of tasks     FIM - Radio producer Devices: Mining engineer Transfers: 4-To toilet/BSC: Min A (steadying Pt. > 75%);4-From toilet/BSC: Min A (steadying Pt. > 75%)  FIM - Bed/Chair Transfer Bed/Chair Transfer Assistive Devices: Arm rests Bed/Chair Transfer: 4: Bed > Chair or W/C: Min A (steadying Pt. > 75%);4: Chair or W/C > Bed: Min A (steadying Pt. > 75%)  FIM - Locomotion: Wheelchair Distance: 60 Locomotion: Wheelchair: 2: Travels 50 - 149 ft with supervision, cueing or coaxing FIM - Locomotion:  Ambulation  Locomotion: Ambulation Assistive Devices: Walker - Rolling;Cane - Pharmacist, hospital (comment) (HHA) Ambulation/Gait Assistance: 4: Min assist;3: Mod assist Locomotion: Ambulation: 2: Travels 50 - 149 ft with moderate assistance (Pt: 50 - 74%)  Comprehension Comprehension Mode: Auditory Comprehension: 4-Understands basic 75 - 89% of the time/requires cueing 10 - 24% of the time  Expression Expression Mode: Verbal Expression: 4-Expresses basic 75 - 89% of the time/requires cueing 10 - 24% of the time. Needs helper to occlude trach/needs to repeat words.  Social Interaction Social Interaction: 3-Interacts appropriately 50 - 74% of the time - May be physically or verbally inappropriate.  Problem Solving Problem Solving: 2-Solves basic 25 - 49% of the time - needs direction more than half the time to initiate, plan or complete simple activities  Memory Memory: 4-Recognizes or recalls 75 - 89% of the time/requires cueing 10 - 24% of the time  Medical Problem List and Plan:  1. 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 received protective helmet  2. DVT Prophylaxis/Anticoagulation: SDH. Chronic Coumadin discontinued secondary to SDH  3. Pain Management: Tylenol as needed  4. History of alcohol tobacco abuse. Monitor for any signs of withdrawal. Provide counseling  5. Neuropsych: This patient is capable of making decisions on his own behalf.  6. Seizure prophylaxis. Keppra 500 mg twice a day. Monitor for any seizure activity  7. Atrial fibrillation. Chronic Coumadin discontinued. Continue Cardizem 60 mg 4 times a day, Coreg 25 mg twice a day  -encourage adequate fluid intake  8. Pseudomonas UTI. Levaquin initiated 05/02/2014  9. History of CAD with CABG. Chest pain or shortness of  breath. Continue to monitor  10. Hyperlipidemia. Lipitor  11. Hyperglycemia. Monitor blood sugars while on Decadron taper- sugars still elevated  LOS (Days) 2 A FACE TO FACE EVALUATION WAS PERFORMED  Meredith Staggers 05/06/2014 8:26 AM

## 2014-05-06 NOTE — Progress Notes (Signed)
Occupational Therapy Session Note  Patient Details  Name: Alejandro [name] MRN: [id number] Date of Birth: 06-24-1946  Today's Date: 05/06/2014 Time: 0730-0830 and 1130-1200 Time Calculation: 60 min and 30 min  Short Term Goals: Week 1:  OT Short Term Goal 1 (Week 1): Focus on LTGs d/t short ELOS  Skilled Therapeutic Interventions/Progress Updates:    Session 1: Pt seen for ADL training session with focus on functional transfers, standing balance, safety awareness, problem solving, sequencing, and attention. Pt received supine in bed. Completed supine>sit and sit>stand with min cues and min assist. Donned helmet with min cues and ambulated ~10 ft to bathroom with RW with pt requiring min assist and min cues for safety and balance. Pt required min assist and mod cues for problem solving during shower transfer. Initiated use of soap however difficulty with sequencing as pt stating he was unsure of where to start. Demonstrated decreased awareness and problem solving as pt randomly standing throughout shower requiring mod cues to attend to bathing while seated. Completed dressing with extra time requiring mod multimodal cues for orienting pants. Alternating attention impaired as pt stopping to answer question before completing tasks. Pt left in w/c with helmet on and quick release belt in place.    Session 2: Pt seen for 1:1 skilled OT session with focus on activity tolerance, functional transfers, safety awareness, short term memory, sustained attention, and problem solving. Pt received sitting in w/c with helmet on. Pt propelled himself in w/c from his room to the rehab apartment for ADL session with min cues from therapist and extra time to complete secondary to decreased activity tolerance. Completed sit<>stand with min assist and min cues. Educated and demonstrated on simulated walk in shower transfer using shower frame. Practiced shower transfer 3x using RW and shower chair with pt requiring min assist and  max multimodal cues secondary to decreased attention, decreased short term memory and decreased problem solving. With repetition pt able to complete with min cues on final trial. Pt requiring redirection ~50% of task for safety awareness. Pt returned to room and left in w/c with quick release belt in place and all needs in reach.   Therapy Documentation Precautions:  Precautions Precautions: Fall Precaution Comments: Helmet when OOB Restrictions Weight Bearing Restrictions: No General:   Vital Signs:   Pain: Pain Assessment Pain Assessment: No/denies pain Pain Score: 0-No pain  See FIM for current functional status  Therapy/Group: Individual Therapy  Fermin Schwab Jeromiah Ohalloran 05/06/2014, 12:20 PM

## 2014-05-06 NOTE — Progress Notes (Addendum)
Speech Language Pathology Daily Session Note  Patient Details  Name: Alejandro [name] MRN: [id number] Date of Birth: 06/21/1946  Today's Date: 05/06/2014 Time: 0830-0930 Time Calculation (min): 60 min  Short Term Goals: Week 1: SLP Short Term Goal 1 (Week 1): Pt will demonstrate selective attention in a mildly distracting enviornment for 45 minutes with supervision verbal cues.  SLP Short Term Goal 2 (Week 1): Pt will utilize external memory aids to recall new, daily information with supervision multimodal cues.  SLP Short Term Goal 3 (Week 1): Pt will identify 2 physical and 2 cognitive deficits with Min A multimodal cues.  SLP Short Term Goal 4 (Week 1): Pt will demonstrate functional problem solving for basic and familiar tasks with supervision multimodal cues.  SLP Short Term Goal 5 (Week 1): Pt will demonstrate efficient mastication of regular textures without overt s/s of aspiration with Mod I.  SLP Short Term Goal 6 (Week 1): Pt will consume current diet with Mod I to minimize overt s/s of aspiration.   Skilled Therapeutic Interventions: Patient seen for treatment with focus on dysphagia and cognitive goals. SLP completed diagnostic skilled observation of po intake, diet prescribed by primary SLP, to faciliate usage of safe swallowing strategies and monitor for diet tolerance . Patient able to safely consume dysphagia 3 solids, thin liquids with supervision assistance for small bites/sips and only subtle  s/s of aspiration characterized by subtle wet vocal quality with subsequent and spontaneous return to clear voice following independent intervention (dry swallow or cough). No diet modifications needed at this time as patient appears to be compensating for any difficulty. Dysphagia 3 solids likely appropriate long term given lack of dentition with patient reporting consumption of mostly soft solids prior to admission. SLP intervening during meal with basic biographical questions to faclitate  ability to alternate attention to task which patient proved able to do in a semi-distracting environment. Patient demonstrating anticipatory awareness of needs after d/c with min questioning cues. SLP provided patient with moderate verbal instructional cues for sequencing during a previously familiar computer tasks to address problem solving deficits for functional tasks.    FIM:  Comprehension Comprehension Mode: Auditory Comprehension: 4-Understands basic 75 - 89% of the time/requires cueing 10 - 24% of the time Expression Expression Mode: Verbal Expression: 4-Expresses basic 75 - 89% of the time/requires cueing 10 - 24% of the time. Needs helper to occlude trach/needs to repeat words. Social Interaction Social Interaction: 4-Interacts appropriately 75 - 89% of the time - Needs redirection for appropriate language or to initiate interaction. Problem Solving Problem Solving: 3-Solves basic 50 - 74% of the time/requires cueing 25 - 49% of the time Memory Memory: 4-Recognizes or recalls 75 - 89% of the time/requires cueing 10 - 24% of the time  Pain Pain Assessment Pain Assessment: No/denies pain  Therapy/Group: Individual Therapy  Gabriel Rainwater MA, [place] [phone]   [name] [name] 05/06/2014, 11:00 AM

## 2014-05-06 NOTE — Progress Notes (Signed)
Physical Therapy Session Note  Patient Details  Name: Alejandro [name] MRN: [id number] Date of Birth: 11/17/1946  Today's Date: 05/06/2014 Time: 1030-1130 and 1303-1400 Time Calculation (min): 60 min and 57 min  Short Term Goals: Week 1:  PT Short Term Goal 1 (Week 1): STGs=LTGs secondary to ELOS  Skilled Therapeutic Interventions/Progress Updates:    AM Session: Patient received sitting in wheelchair. Session focused on functional mobility and functional strengthening. Wheelchair mobility with B UE >150' with supervision and with B LE >150' with supervision. Emphasis with sit<>stand from EOM progressing to no UE use, focus on slow, controlled descent for improved eccentric quad control; patient demonstrates good alternating attention with activity in busy gym . Functional ambulation with shopping cart (UEs on PVC pipes to facilitate upright posture and improved chest expansion) 75' x2 with min guard-minA, cues for decreased BOS and upright posture. Stair negotiation x5 with B handrails and alternating pattern with minA. Patient left seated in wheelchair with seatbelt donned in new room.  PM Session: Patient received sitting in wheelchair. Session focused on functional mobility, dynamic standing balance, and functional mobility in ADL apartment. Pipe tree task of moderate difficulty in standing, patient performs with supervision cues and min guard for standing balance. Functional ambulation x60' to ADL apartment and gait around apt on carpet with RW and min guard. Furniture transfers and bed mobility on regular bed with supervision, cues for safety and sequencing. Patient returned to room and therapist applied pad to frontal region of helmet to increase comfort and fit. Patient ambulated to bathroom with RW and min guard and left sitting on toilet with nurse tech present to assist after toileting.  Therapy Documentation Precautions:  Precautions Precautions: Fall Precaution Comments: Helmet when  OOB Restrictions Weight Bearing Restrictions: No Pain: Pain Assessment Pain Assessment: No/denies pain Pain Score: 0-No pain Locomotion : Ambulation Ambulation/Gait Assistance: 4: Min assist;4: Min guard Wheelchair Mobility Distance: 150   See FIM for current functional status  Therapy/Group: Individual Therapy  Lillia Abed. Saleha Kalp, PT, DPT 05/06/2014, 12:15 PM

## 2014-05-07 ENCOUNTER — Inpatient Hospital Stay (HOSPITAL_COMMUNITY): Payer: BC Managed Care – PPO | Admitting: *Deleted

## 2014-05-07 ENCOUNTER — Inpatient Hospital Stay (HOSPITAL_COMMUNITY): Payer: BC Managed Care – PPO | Admitting: Speech Pathology

## 2014-05-07 ENCOUNTER — Inpatient Hospital Stay (HOSPITAL_COMMUNITY): Payer: BC Managed Care – PPO

## 2014-05-07 ENCOUNTER — Inpatient Hospital Stay (HOSPITAL_COMMUNITY): Payer: BC Managed Care – PPO | Admitting: Physical Therapy

## 2014-05-07 ENCOUNTER — Encounter (HOSPITAL_COMMUNITY): Payer: BC Managed Care – PPO

## 2014-05-07 DIAGNOSIS — I2510 Atherosclerotic heart disease of native coronary artery without angina pectoris: Secondary | ICD-10-CM

## 2014-05-07 DIAGNOSIS — I5022 Chronic systolic (congestive) heart failure: Secondary | ICD-10-CM

## 2014-05-07 DIAGNOSIS — S069XAA Unspecified intracranial injury with loss of consciousness status unknown, initial encounter: Secondary | ICD-10-CM

## 2014-05-07 DIAGNOSIS — S069X9A Unspecified intracranial injury with loss of consciousness of unspecified duration, initial encounter: Secondary | ICD-10-CM

## 2014-05-07 DIAGNOSIS — I4891 Unspecified atrial fibrillation: Secondary | ICD-10-CM

## 2014-05-07 DIAGNOSIS — W19XXXA Unspecified fall, initial encounter: Secondary | ICD-10-CM

## 2014-05-07 LAB — GLUCOSE, CAPILLARY
Glucose-Capillary: 119 mg/dL — ABNORMAL HIGH (ref 70–99)
Glucose-Capillary: 149 mg/dL — ABNORMAL HIGH (ref 70–99)
Glucose-Capillary: 182 mg/dL — ABNORMAL HIGH (ref 70–99)
Glucose-Capillary: 236 mg/dL — ABNORMAL HIGH (ref 70–99)

## 2014-05-07 NOTE — Care Management Note (Signed)
[facility] Individual Statement of Services  Patient Name:  Alejandro [name]  Date:  05/07/2014  Welcome to the [facility].  Our goal is to provide you with an individualized program based on your diagnosis and situation, designed to meet your specific needs.  With this comprehensive rehabilitation program, you will be expected to participate in at least 3 hours of rehabilitation therapies Monday-Friday, with modified therapy programming on the weekends.  Your rehabilitation program will include the following services:  Physical Therapy (PT), Occupational Therapy (OT), Speech Therapy (ST), 24 hour per day rehabilitation nursing, Therapeutic Recreaction (TR), Neuropsychology, Case Management (Social Worker), Rehabilitation Medicine, Nutrition Services and Pharmacy Services  Weekly team conferences will be held on Tuesdays to discuss your progress.  Your Social Worker will talk with you frequently to get your input and to update you on team discussions.  Team conferences with you and your family in attendance may also be held.  Expected length of stay: 7-10 days  Overall anticipated outcome: modified independent  Depending on your progress and recovery, your program may change. Your Social Worker will coordinate services and will keep you informed of any changes. Your Social Worker's name and contact numbers are listed  below.  The following services may also be recommended but are not provided by the [facility] will be made to provide these services after discharge if needed.  Arrangements include referral to agencies that provide these services.  Your insurance has been verified to be:  [employer] Your primary doctor is:  Dr. [name]  Pertinent information will be shared with your doctor and  your insurance company.  Social Worker:  Lennart Pall, [place] or ([phone]   Information discussed with and copy given to patient by: Lennart Pall, 05/07/2014, 9:23 AM

## 2014-05-07 NOTE — Progress Notes (Signed)
Physical Therapy Session Note  Patient Details  Name: Alejandro [name] MRN: [id number] Date of Birth: [birth year]  Today's Date: 05/07/2014 Time: 1535-1600 Time Calculation (min): 25 min  Skilled Therapeutic Interventions/Progress Updates:  1:1. Pt received sitting in w/c, ready for therapy. Focus this session on functional ambulation, standing balance and cognitive remediation. Pt able to safely ambulate 175'x2 w/ close(S)-min guard A. Pt demonstrating very slow pace due to fatigue. Pt engaged in bean bag toss while standing on compliant surface with L UE support and min A. Pt req mod A for sustained attention to task and keeping track of score. Pt utilizing reacher to pick up bean bags at end of task due to back pain. Pt sitting in w/c at end of session w/ all needs in reach, quick release belt in place and wife in room.   Therapy Documentation Precautions:  Precautions Precautions: Fall Precaution Comments: Helmet when OOB Restrictions Weight Bearing Restrictions: No  See FIM for current functional status  Therapy/Group: Individual Therapy  Gilmore Laroche 05/07/2014, 5:55 PM

## 2014-05-07 NOTE — Progress Notes (Signed)
Speech Language Pathology Daily Session Note  Patient Details  Name: Alejandro [name] MRN: [id number] Date of Birth: 1946-05-16  Today's Date: 05/07/2014 Time: 1138-1208 Time Calculation (min): 30 min  Short Term Goals: Week 1: SLP Short Term Goal 1 (Week 1): Pt will demonstrate selective attention in a mildly distracting enviornment for 45 minutes with supervision verbal cues.  SLP Short Term Goal 2 (Week 1): Pt will utilize external memory aids to recall new, daily information with supervision multimodal cues.  SLP Short Term Goal 3 (Week 1): Pt will identify 2 physical and 2 cognitive deficits with Min A multimodal cues.  SLP Short Term Goal 4 (Week 1): Pt will demonstrate functional problem solving for basic and familiar tasks with supervision multimodal cues.  SLP Short Term Goal 5 (Week 1): Pt will demonstrate efficient mastication of regular textures without overt s/s of aspiration with Mod I.  SLP Short Term Goal 6 (Week 1): Pt will consume current diet with Mod I to minimize overt s/s of aspiration.   Skilled Therapeutic Interventions:  Pt was seen for skilled speech therapy targeting functional problem solving, attention, and memory.  Pt required mod-max assist to identify at least one current goal of therapy.  During a structured new learing task, pt required min assist faded to supervision cuing for thought organization and error awareness.  Pt was noted to selectively attend to task with no cuing needed for redirection.  SLP facilitated increased challenge by asking biographical questions during task to target alternating attention and pt required mod assist for multitasking.  Continue per current plan of care.     FIM:  Comprehension Comprehension Mode: Auditory Comprehension: 4-Understands basic 75 - 89% of the time/requires cueing 10 - 24% of the time Expression Expression Mode: Verbal Expression: 5-Expresses basic 90% of the time/requires cueing < 10% of the time. Social  Interaction Social Interaction: 5-Interacts appropriately 90% of the time - Needs monitoring or encouragement for participation or interaction. Problem Solving Problem Solving: 4-Solves basic 75 - 89% of the time/requires cueing 10 - 24% of the time Memory Memory: 3-Recognizes or recalls 50 - 74% of the time/requires cueing 25 - 49% of the time FIM - Eating Eating Activity: 5: Supervision/cues  Pain Pain Assessment Pain Assessment: No/denies pain Pain Score: 0-No pain  Therapy/Group: Individual Therapy  [name], M.A. CCC-SLP  Selinda Orion Dorrie Cocuzza 05/07/2014, 4:33 PM

## 2014-05-07 NOTE — Progress Notes (Signed)
Notes reviewed and this therapist agrees with the information provided.

## 2014-05-07 NOTE — Progress Notes (Signed)
Physical Therapy Note  Patient Details  Name: [name] MRN: [id number] Date of Birth: Sep 07, 1946 Today's Date: 05/07/2014  Time: 948-016 27 minutes  1:1 No c/o pain.  Gait with RW in controlled environment with close supervision/min A for avoiding obstacles, some L inattention noted.  Standing balance with word finding task with ball toss, pt requires min questioning cues for word finding.  Gait with divided attention tasks, with close supervision for gait, min/mod questioning cues.  Pt able to find room with supervision cuing.   Kennith Gain 05/07/2014, 8:58 AM

## 2014-05-07 NOTE — Progress Notes (Signed)
[place] PHYSICAL MEDICINE & REHABILITATION     PROGRESS NOTE    Subjective/Complaints: Had a good day. No complaints. Received helmet and it fits appropriately.   Objective: Vital Signs: Blood pressure 118/74, pulse 84, temperature 98.2 F (36.8 C), temperature source Oral, resp. rate 18, weight 47.446 kg (104 lb 9.6 oz), SpO2 98.00%. No results found.  Recent Labs  05/05/14 0515  WBC 13.1*  HGB 10.9*  HCT 31.2*  PLT 303    Recent Labs  05/05/14 0515  NA 131*  K 4.9  CL 97  GLUCOSE 144*  BUN 28*  CREATININE 0.90  CALCIUM 8.3*   CBG (last 3)   Recent Labs  05/06/14 1633 05/06/14 2201 05/07/14 0735  GLUCAP 181* 137* 119*    Wt Readings from Last 3 Encounters:  05/05/14 47.446 kg (104 lb 9.6 oz)  05/04/14 104.282 kg (229 lb 14.4 oz)  05/04/14 104.282 kg (229 lb 14.4 oz)    Physical Exam:  Sitting in chair. No distress. obese  Eyes:  Pupils round and reactive to light  Mouth: edentulous, mucosa pink and moist  Head: craniectomy site sunken in. No drainage. Wound well approximated  Neck: Normal range of motion. Neck supple. No thyromegaly present.  Cardiovascular:  Cardiac rate controlled without murmur  Respiratory: Effort normal and breath sounds normal. No respiratory distress.  GI: Soft. Bowel sounds are normal. He exhibits no distension.  Flap site dressed/intact. Minimally tender Neurological: He is alert.  Makes good eye contact with examiner. Far left upper quadrant of vision may be affected. No neglect. appropriate for name, place, age. Bilat mittens in place.Followed simple commands. Moves all 4 limbs with ease. Can follow simple commands. Improved insight and awareness. Recalls  biographical information. Non-impulsive. Sensory exam intact. dtr's 1+.  Skin:  Intact other than surgical sites  Psych: pleasant, cooperative, non-impulsive, non-anxious   Assessment/Plan: 1. 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 Physiatrist is providing close team supervision and 24 hour management of active medical problems listed below. Physiatrist and rehab team continue to assess barriers to discharge/monitor patient progress toward functional and medical goals. FIM: FIM - Bathing Bathing Steps Patient Completed: Chest;Right Arm;Left Arm;Abdomen;Front perineal area;Buttocks;Right upper leg;Left upper leg Bathing: 4: Min-Patient completes 8-9 38f 10 parts or 75+ percent  FIM - Upper Body Dressing/Undressing Upper body dressing/undressing steps patient completed: Thread/unthread right sleeve of pullover shirt/dresss;Thread/unthread left sleeve of pullover shirt/dress;Put head through opening of pull over shirt/dress;Pull shirt over trunk Upper body dressing/undressing: 5: Set-up assist to: Obtain clothing/put away FIM - Lower Body Dressing/Undressing Lower body dressing/undressing steps patient completed: Thread/unthread right underwear leg;Pull underwear up/down;Thread/unthread right pants leg;Thread/unthread left pants leg;Pull pants up/down;Don/Doff right sock;Don/Doff left sock;Don/Doff right shoe;Don/Doff left shoe;Fasten/unfasten right shoe;Fasten/unfasten left shoe Lower body dressing/undressing: 4: Min-Patient completed 75 plus % of tasks     FIM - Radio producer Devices: Mining engineer Transfers: 4-To toilet/BSC: Min A (steadying Pt. > 75%);4-From toilet/BSC: Min A (steadying Pt. > 75%)  FIM - Control and instrumentation engineer Devices: Walker;Bed rails;Arm rests;HOB elevated (Simultaneous filing. User may not have seen previous data.) Bed/Chair Transfer: 4: Supine > Sit: Min A (steadying Pt. > 75%/lift 1 leg);4: Bed > Chair or W/C: Min A (steadying Pt. > 75%) (Simultaneous filing. User may not have seen previous data.)  FIM - Locomotion:  Wheelchair Distance: 150 Locomotion: Wheelchair: 5: [employer]  150 ft or more: maneuvers on rugs and over door sills with supervision, cueing or coaxing FIM - Locomotion: Ambulation Locomotion: Ambulation Assistive Devices: Other (comment) (shopping cart) Ambulation/Gait Assistance: 4: Min assist;4: Min guard Locomotion: Ambulation: 2: Travels 50 - 149 ft with minimal assistance (Pt.>75%)  Comprehension Comprehension Mode: Auditory Comprehension: 4-Understands basic 75 - 89% of the time/requires cueing 10 - 24% of the time  Expression Expression Mode: Verbal Expression: 4-Expresses basic 75 - 89% of the time/requires cueing 10 - 24% of the time. Needs helper to occlude trach/needs to repeat words.  Social Interaction Social Interaction: 3-Interacts appropriately 50 - 74% of the time - May be physically or verbally inappropriate.  Problem Solving Problem Solving: 2-Solves basic 25 - 49% of the time - needs direction more than half the time to initiate, plan or complete simple activities  Memory Memory: 4-Recognizes or recalls 75 - 89% of the time/requires cueing 10 - 24% of the time  Medical Problem List and Plan:  1. 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 received protective helmet   -remove staples today 2. DVT Prophylaxis/Anticoagulation: SDH. Chronic Coumadin discontinued secondary to SDH  3. Pain Management: Tylenol as needed  4. History of alcohol tobacco abuse. Monitor for any signs of withdrawal. Provide counseling  5. Neuropsych: This patient is capable of making decisions on his own behalf.  6. Seizure prophylaxis. Keppra 500 mg twice a day. Monitor for any seizure activity  7. Atrial fibrillation. Chronic Coumadin discontinued. Continue Cardizem 60 mg 4 times a day, Coreg  25 mg twice a day  -encourage adequate fluid intake  8. Pseudomonas UTI. Levaquin initiated 05/02/2014 --continue through today 9. History of CAD with CABG. Chest pain or shortness of breath. Continue to monitor  10. Hyperlipidemia. Lipitor  11. Hyperglycemia. Monitor blood sugars while on Decadron taper- sugars still elevated but rending down  LOS (Days) 3 A FACE TO FACE EVALUATION WAS PERFORMED  Meredith Staggers 05/07/2014 8:56 AM

## 2014-05-07 NOTE — Progress Notes (Signed)
Physical Therapy Session Note  Patient Details  Name: Alejandro [name] MRN: [id number] Date of Birth: 1946-06-23  Today's Date: 05/07/2014 Time: 1400-1500 Time Calculation (min): 60 min  Short Term Goals: Week 1:  PT Short Term Goal 1 (Week 1): STGs=LTGs secondary to ELOS  Skilled Therapeutic Interventions/Progress Updates:    Patient received sitting in wheelchair. Session focused on cognitive remediation, functional ambulation, and furniture transfers. Wheelchair mobility >150' x2 with B UE and supervision for activity tolerance. Standing dynamic balance activity: ball toss, patient instructed to name animals that start with letters on ball, requires mod cues initially progressing to min cues.   Household ambulation in [place] on carpet and tile surfaces with RW and min guard progressing to close supervision. Furniture transfers from various low, cushioned chairs with min guard-minA. Functional ambulation from [place] back to unit with RW and close supervision >150'. Patient left sitting in wheelchair with seatbelt donned and all needs within reach.  Therapy Documentation Precautions:  Precautions Precautions: Fall Precaution Comments: Helmet when OOB Restrictions Weight Bearing Restrictions: No Pain: Pain Assessment Pain Assessment: No/denies pain Pain Score: 0-No pain Locomotion : Ambulation Ambulation/Gait Assistance: 4: Min guard;5: Supervision Wheelchair Mobility Distance: 150   See FIM for current functional status  Therapy/Group: Individual Therapy  Alejandro [name]. Alejandro [name], PT, DPT 05/07/2014, 4:05 PM

## 2014-05-07 NOTE — Progress Notes (Signed)
Occupational Therapy Session Note  Patient Details  Name: Alejandro [name] MRN: [id number] Date of Birth: 1946/05/16  Today's Date: 05/07/2014 Time: 1030-1130 and 1300-1400 Time Calculation: 60 min and 60 min   Short Term Goals: Week 1:  OT Short Term Goal 1 (Week 1): Focus on LTGs d/t short ELOS  Skilled Therapeutic Interventions/Progress Updates:   Session 1: Pt seen for ADL retraining with focus on sequencing, problem solving, sustained attention, functional transfers, and dynamic standing balance. Pt received sitting in w/c with helmet on initiating need to toilet. Pt oriented to person, place and time. Ambulated to bathroom with RW requiring min assist and min cues for safety. Transferred to shower chair with min assist/cues for problem solving shower transfer however decreased sustained attention to following commands. Pt demonstrated difficulty sequencing bathing at shower level requiring mod cues. Pt standing at inappropriate times throughout shower requiring mod cues to attend to task secondary to difficulty problem solving. Required min assist for stand<>sit with pt initiating dressing and attending to task ~75% with min cues. Completed oral care in standing at sink with min cues and min assist for standing balance. Pt left in w/c with helmet donned and quick release belt in place.   Session 2: Pt seen for 1:1 skilled OT session with focus on dynamic standing balance, activity tolerance, and overall cognitive remediation. Pt received sitting in w/c with helmet on. Engaged in therapeutic pipe tree activity outside requiring min assist for dynamic standing balance and min cues for redirection. Pt demonstrated increased problem solving with use of external visual with pt attending to task ~2 minutes on second trial. Utilized therapeutic horse shoe activity in standing with RW requiring min assist for dynamic standing balance as pt reaching across midline to grab and shifting weight forward to throw.  Pt recalling score of game ~25% accuracy secondary to difficulty with short term memory. Pt fatiguing quickly throughout session requiring frequent rest breaks. Pt returned to room seated in w/c with helmet donned and quick release belt in place.     Therapy Documentation Precautions:  Precautions Precautions: Fall Precaution Comments: Helmet when OOB Restrictions Weight Bearing Restrictions: No General:   Vital Signs:   Pain:  Pt reporting no pain at this time.  See FIM for current functional status  Therapy/Group: Individual Therapy  Fermin Schwab Kennice Finnie 05/07/2014, 12:43 PM

## 2014-05-07 NOTE — Progress Notes (Signed)
Social Work  Social Work Assessment and Plan  Patient Details  Name: Alejandro [name] MRN: [id number] Date of Birth: May 30, 1946  Today's Date: 05/07/2014  Problem List:  Patient Active Problem List   Diagnosis Date Noted  . UTI (urinary tract infection) 05/03/2014  . Acute delirium 04/23/2014  . Alcohol withdrawal 04/23/2014  . Subdural hematoma 04/19/2014  . SDH (subdural hematoma) 04/19/2014  . Altered mental status 04/19/2014  . Accelerated hypertension 04/19/2014  . Encounter for therapeutic drug monitoring 02/09/2014  . Recurrent ventral hernia 10/22/2012  . Skin lesion 09/26/2012  . Hernia 09/26/2012  . SYSTOLIC HEART FAILURE, CHRONIC 06/15/2009  . SYSTOLIC HEART FAILURE, ACUTE ON CHRONIC 01/28/2009  . HYPERLIPIDEMIA-MIXED 01/27/2009  . ATRIAL FIBRILLATION 01/27/2009  . CAD, ARTERY BYPASS GRAFT 12/23/2008   Past Medical History:  Past Medical History  Diagnosis Date  . Systolic heart failure   . 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 Dyslipidemia   . HTN (hypertension)   . Atrial fibrillation   . Cancer     around nose  . Tobacco abuse   . Alcohol abuse   . H/O hiatal hernia    Past Surgical History:  Past Surgical History  Procedure Laterality Date  . Coronary artery bypass graft  12/24/2008    4 vessel  . Umbilical hernia repair  2010  . Hernia repair  2012  . Coronary artery bypass graft  2012  . Ventral hernia repair N/A 01/22/2013    Procedure: HERNIA REPAIR VENTRAL ADULT;  Surgeon: [name], MD;  Location: [place] [facility];  Service: General;  Laterality: N/A;  . Application of a-cell of chest/abdomen N/A 01/22/2013    Procedure: APPLICATION OF A-CELL OF CHEST/ABDOMEN;  Surgeon: [name], MD;  Location: [facility];  Service: General;  Laterality: N/A;  . Cystoscopy N/A 01/22/2013    Procedure: Consuela Mimes;  Surgeon: [name], MD;  Location: [facility];  Service: Urology;  Laterality: N/A;  Cystoscopy with balloon dilation. Insertion of  coude catheter.  . Craniotomy Right 04/19/2014    Procedure: CRANIOTOMY HEMATOMA EVACUATION SUBDURAL;  Surgeon: [name], MD;  Location: [place] [facility];  Service: Neurosurgery;  Laterality: Right;  right  . Craniotomy Right 04/23/2014    Procedure: Craniotomy for Intracerebral Hemorrhage;  Surgeon: [name], MD;  Location: [place] [facility];  Service: Neurosurgery;  Laterality: Right;   Social History:  reports that he has been smoking Cigarettes.  He has a 50 pack-year smoking history. He has never used smokeless tobacco. He reports that he drinks about 6 ounces of alcohol per week. He reports that he does not use illicit drugs.  Family / Support Systems Marital Status: Married Patient Roles: Spouse Spouse/Significant Other: wife, Tierra Divelbiss @ [phone] Children: daughter, Zachery Conch [facility]) @ (C) [phone]; daughter, Marzetta Board [facility]) Anticipated Caregiver: wife Ability/Limitations of Caregiver: pt notes no significant limitations, however, reports she suffers from fibromyalgia "mild" per pt Caregiver Availability: 24/7 (wife does help with granddtr but can be avail for pt 24-7) Family Dynamics: pt describes good support and close relationships with wife, daughters and grandchildren.  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  Notes he feels like "I'm not completely up to par with my memory yet."  Confirms with still some cognitive struggles.  He denies any emotional distress. No s/s of depression or anxiety.  Will refer to neuropsych for cognitive testing and to monitor emotional status. Recent Psychosocial Issues: None Pyschiatric History: None Substance Abuse History: Although MD note indicates h/o "alcohol abuse", pt denies and states, "I drink a little beer but it's not a problem."  Notes never any ETOH counseling or tx as he does not believe is was problematic for him.  Patient / Family Perceptions, Expectations & Goals Pt/Family understanding of illness & functional limitations: pt able to give basic description of his TBI, surgery performed and current functional limitations/ need for CIR.  Await chance to speak with wife to determine her level of understanding. Premorbid pt/family roles/activities: pt was completely independent and working full-time hoping to retire soon.   Anticipated changes in roles/activities/participation: pt will likely require 24/7 supervision and possibly some physical assistance - wife to assume caregiver roles Pt/family expectations/goals: "i just hope I get better....that I don't put too much on my wife..."  [employer]: None Premorbid Home Care/DME Agencies: None Transportation available at discharge: yes Resource referrals recommended: Neuropsychology;Support group (specify)  Discharge Planning Living Arrangements: Spouse/significant other Support Systems: Spouse/significant other;Children;Other relatives Type of Residence: Private residence Insurance Resources: Multimedia programmer (specify) Clinical cytogeneticist) Financial Resources: Employment Financial Screen Referred: No Living Expenses: Education officer, community Management: Patient Does  the patient have any problems obtaining your medications?: No Home Management: pt and wife share responsibilities Patient/Family Preliminary Plans: pt to return home with wife as primary caregiver and intermittent support from daughter and grandaughters Social Work Anticipated Follow Up Needs: HH/OP;Support Group Expected length of stay: 7-10 days  Clinical Impression Pleasant gentleman here following a fall with TBI and s/p craniectomy and wearing helmut.  Good family support with wife to be primary support.  Pt very pleasant and oriented, however, doesn't feel he's "..up to par on my memory..."  No significant emotional distress.  No s/s of depression or anxiety.  Will follow for d/c planning and support.   Lennart Pall 05/07/2014, 2:58 PM

## 2014-05-08 ENCOUNTER — Inpatient Hospital Stay (HOSPITAL_COMMUNITY): Payer: BC Managed Care – PPO | Admitting: Speech Pathology

## 2014-05-08 ENCOUNTER — Inpatient Hospital Stay (HOSPITAL_COMMUNITY): Payer: BC Managed Care – PPO | Admitting: Physical Therapy

## 2014-05-08 ENCOUNTER — Inpatient Hospital Stay (HOSPITAL_COMMUNITY): Payer: BC Managed Care – PPO | Admitting: *Deleted

## 2014-05-08 DIAGNOSIS — I4891 Unspecified atrial fibrillation: Secondary | ICD-10-CM

## 2014-05-08 DIAGNOSIS — I6200 Nontraumatic subdural hemorrhage, unspecified: Secondary | ICD-10-CM

## 2014-05-08 LAB — GLUCOSE, CAPILLARY
GLUCOSE-CAPILLARY: 122 mg/dL — AB (ref 70–99)
GLUCOSE-CAPILLARY: 158 mg/dL — AB (ref 70–99)
GLUCOSE-CAPILLARY: 213 mg/dL — AB (ref 70–99)
Glucose-Capillary: 129 mg/dL — ABNORMAL HIGH (ref 70–99)

## 2014-05-08 NOTE — Progress Notes (Signed)
Occupational Therapy Session Note  Patient Details  Name: Alejandro [name] MRN: [id number] Date of Birth: May 20, 1946  Today's Date: 05/08/2014 Time:  -  0800-0900  (60 min)    Short Term Goals: Week 1:  OT Short Term Goal 1 (Week 1): Focus on LTGs d/t short ELOS  Skilled Therapeutic Interventions/Progress Updates:    Pt seen for ADL retraining with focus on sequencing, problem solving, sustained attention, functional transfers, and dynamic standing balance. Pt received lying in bed with breakfast finished.  Pt oriented x3 with pt reported issues with balance and legs and tendency to stay to one side when walking down hall.   Pt. Donned helmet backwards.   Ambulated to bathroom with RW requiring min assist and min cues for safety. Transferred to shower chair with min assist.  Needed mod verbal instruction for operating shower valves and regulating temperature.  Pt perserverated on washing arms for several minutes.  Stood to wash peri area with SBA plus grab bar.  Pt ambulated to toilet and did partial dressing.  Ambulated to bed to complete LB dressing.   Pt left in w/c with helmet donned and quick release belt in place.   Therapy Documentation Precautions:  Precautions Precautions: Fall Precaution Comments: Helmet when OOB Restrictions Weight Bearing Restrictions: No     Pain:  Head=  4/10 See FIM for current functional status  Therapy/Group: Individual Therapy  Lisa Roca 05/08/2014, 8:05 AM

## 2014-05-08 NOTE — Progress Notes (Signed)
Physical Therapy Session Note  Patient Details  Name: Alejandro [name] MRN: [id number] Date of Birth: Jun 15, 1946  Today's Date: 05/08/2014 Time: 4695-0722 Time Calculation (min): 42 min  Short Term Goals: Week 1:  PT Short Term Goal 1 (Week 1): STGs=LTGs secondary to ELOS  Skilled Therapeutic Interventions/Progress Updates:  Pt was seen bedside in the pm. Pt propelled w/c with B UE and LEs about 150 feet with S for general conditioning and strengthening, occasional verbal cues for directions. Pt rode Nu-step x 10 minutes at level 2. Pt ambulated with rolling walker about 150 feet x 2 and 200 feet with S to min guard and verbal cues for directions.    Therapy Documentation Precautions:  Precautions Precautions: Fall Precaution Comments: Helmet when OOB Restrictions Weight Bearing Restrictions: No General:   Pain: No c/o pain.    Locomotion : Ambulation Ambulation/Gait Assistance: 4: Min guard;5: Supervision   See FIM for current functional status  Therapy/Group: Individual Therapy  Dub Amis 05/08/2014, 2:59 PM

## 2014-05-08 NOTE — Progress Notes (Signed)
Patient ID: Alejandro [name], male   DOB: 02-Feb-1946, 68 y.o.   MRN: [id number]   [place] PHYSICAL MEDICINE & REHABILITATION     PROGRESS NOTE   05/08/14.  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  Past Medical History  Diagnosis Date  . Systolic heart failure   . 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 Dyslipidemia   . HTN (hypertension)   . Atrial fibrillation   . Cancer     around nose  . Tobacco abuse   . Alcohol abuse   . H/O hiatal hernia      Subjective/Complaints:  Had a good day. No complaints.  Objective: Vital Signs: Blood pressure 123/76, pulse 68, temperature 97.6 F (36.4 C), temperature source Oral, resp. rate 18, weight 104 lb 9.6 oz (47.446 kg), SpO2 100.00%. No results found. No results found for this basename: WBC, HGB, HCT, PLT,  in the last 72 hours No results found for this basename: NA, K, CL, CO, GLUCOSE, BUN, CREATININE, CALCIUM,  in the last 72 hours CBG (last 3)   Recent Labs  05/07/14 1654 05/07/14 2033 05/08/14 0719  GLUCAP 182* 236* 129*    Wt Readings from Last 3 Encounters:  05/05/14 104 lb 9.6 oz (47.446 kg)  05/04/14 229 lb 14.4 oz (104.282 kg)  05/04/14 229 lb 14.4 oz (104.282 kg)    Intake/Output Summary (Last 24 hours) at 05/08/14 0957 Last data filed at 05/08/14 0845  Gross per 24 hour  Intake   1400 ml  Output   1500 ml  Net   -100 ml    Patient Vitals for the past 24 hrs:  BP Temp Temp src Pulse Resp SpO2  05/08/14 0446 123/76 mmHg 97.6 F (36.4 C) Oral 68 18 100 %  05/07/14 2113 133/75 mmHg 97.6 F (36.4 C) Oral 61 18 98 %  05/07/14 1729 134/64 mmHg - - - - -  05/07/14 1403 132/77 mmHg 97.8 F (36.6 C) Oral 72 18 100 %    .  Physical Exam:  Sitting in  chair. No distress. obese  Eyes:  Pupils round and reactive to light  Mouth: edentulous, mucosa pink and moist  Head: craniectomy site sunken in. No drainage. Wound well approximated  Neck: Normal range of motion. Neck supple. No thyromegaly present.  Cardiovascular:  Cardiac rate controlled without murmur  Respiratory: Effort normal and breath sounds normal. No respiratory distress.  GI: Soft. Bowel sounds are normal. He exhibits no distension.  Flap site dressed/intact. Minimally tender Neurological: He is alert.  Makes good eye contact with examinerFollowed simple commands. Moves all 4 limbs with ease. Can follow simple commands. Skin:  Intact other than surgical sites  Psych: pleasant, cooperative, non-impulsive, non-anxious   Assessment/Plan: 1. Functional deficits secondary to right SDH s/p ultimate craniectomy  which require 3+ hours per day of interdisciplinary therapy in a comprehensive inpatient rehab setting. 2. DVT Prophylaxis/Anticoagulation: SDH. Chronic Coumadin discontinued secondary to SDH  3. Seizure prophylaxis. Keppra 500 mg twice a day. Monitor for any seizure activity  4. Atrial fibrillation. Chronic Coumadin discontinued. Continue Cardizem 60 mg 4 times a day, Coreg 25 mg twice a day  -encourage adequate fluid intake  5. Pseudomonas UTI. Levaquin  Completed 6 History of CAD with CABG. Chest pain or shortness of  breath. Continue to monitor  7. Hyperlipidemia. Lipitor  8. Hyperglycemia. Monitor blood sugars while on Decadron taper- sugars still elevated but rending down  LOS (Days) 4 A FACE TO FACE EVALUATION WAS PERFORMED  Marletta Lor 05/08/2014 9:54 AM

## 2014-05-08 NOTE — Progress Notes (Signed)
Speech Language Pathology Daily Session Note  Patient Details  Name: Alejandro [name] MRN: [id number] Date of Birth: 10-25-1946  Today's Date: 05/08/2014 Time: 1050-1120 Time Calculation (min): 30 min  Short Term Goals: Week 1: SLP Short Term Goal 1 (Week 1): Pt will demonstrate selective attention in a mildly distracting enviornment for 45 minutes with supervision verbal cues.  SLP Short Term Goal 2 (Week 1): Pt will utilize external memory aids to recall new, daily information with supervision multimodal cues.  SLP Short Term Goal 3 (Week 1): Pt will identify 2 physical and 2 cognitive deficits with Min A multimodal cues.  SLP Short Term Goal 4 (Week 1): Pt will demonstrate functional problem solving for basic and familiar tasks with supervision multimodal cues.  SLP Short Term Goal 5 (Week 1): Pt will demonstrate efficient mastication of regular textures without overt s/s of aspiration with Mod I.  SLP Short Term Goal 6 (Week 1): Pt will consume current diet with Mod I to minimize overt s/s of aspiration.   Skilled Therapeutic Interventions: Therapeutic intervention complete for cognitive skills re-training.  Patient was able to use environment to answer temporal questions with supervision.  He required min A verbal cues for basic problem solving activities and did very well with answering questions and solving simple problems with distracting environment(door open, TV on low volume).  Continue with current treatment plan.    FIM:  Comprehension Comprehension Mode: Auditory Expression Expression Mode: Verbal Expression: 5-Expresses basic needs/ideas: With extra time/assistive device Problem Solving Problem Solving: 5-Solves basic 90% of the time/requires cueing < 10% of the time Memory Memory: 3-Recognizes or recalls 50 - 74% of the time/requires cueing 25 - 49% of the time  Pain Pain Assessment Pain Assessment: No/denies pain  Therapy/Group: Individual Therapy  Myeasha Ballowe L  Artasia Thang 05/08/2014, 4:06 PM

## 2014-05-08 NOTE — Progress Notes (Signed)
Physical Therapy Session Note  Patient Details  Name: [name] MRN: [id number] Date of Birth: [birth year]  Today's Date: 05/08/2014 Time: 0900-0959 Time Calculation (min): 59 min  Short Term Goals: Week 1:  PT Short Term Goal 1 (Week 1): STGs=LTGs secondary to ELOS  Skilled Therapeutic Interventions/Progress Updates:  Pt was seen bedside in the am, following OT session sitting on edge of bed. Pt transferred edge of bed to w/c with rolling walker and S to min guard. Pt propelled w/c about 150 feet with B UE and LEs requiring S and occasional verbal cues for directions. Pt ambulated 150 feet with rolling walker and S to min guard, wide BOS and decreased step length noted. Pt performed cone taps, criss cross and alternating cone taps, 3 sets x 10 reps for LE strengthening and balance with rolling walker. Pt returned to room ambulating with rolling walker and S to min guard. Pt left sitting in w/c with all needs within reach and quick release belt in place.   Therapy Documentation Precautions:  Precautions Precautions: Fall Precaution Comments: Helmet when OOB Restrictions Weight Bearing Restrictions: No General:   Pain: No c/o pain.    Locomotion : Ambulation Ambulation/Gait Assistance: 4: Min guard;5: Supervision   See FIM for current functional status  Therapy/Group: Individual Therapy  Dub Amis 05/08/2014, 12:23 PM

## 2014-05-09 ENCOUNTER — Inpatient Hospital Stay (HOSPITAL_COMMUNITY): Payer: BC Managed Care – PPO | Admitting: *Deleted

## 2014-05-09 DIAGNOSIS — I5023 Acute on chronic systolic (congestive) heart failure: Secondary | ICD-10-CM

## 2014-05-09 LAB — GLUCOSE, CAPILLARY
GLUCOSE-CAPILLARY: 171 mg/dL — AB (ref 70–99)
GLUCOSE-CAPILLARY: 156 mg/dL — AB (ref 70–99)
Glucose-Capillary: 230 mg/dL — ABNORMAL HIGH (ref 70–99)
Glucose-Capillary: 236 mg/dL — ABNORMAL HIGH (ref 70–99)

## 2014-05-09 NOTE — Progress Notes (Signed)
Patient ID: Alejandro [name], male   DOB: 01-Aug-1946, 68 y.o.   MRN: [id number]   Patient ID: Alejandro [name], male   DOB: 09-Aug-1946, 68 y.o.   MRN: [id number]   [facility]IC[facility] & REHABILITATION     PROGRESS NOTE   05/09/14.  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  Past Medical History  Diagnosis Date  . Systolic heart failure   . 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 Dyslipidemia   . HTN (hypertension)   . Atrial fibrillation   . Cancer     around nose  . Tobacco abuse   . Alcohol abuse   . H/O hiatal hernia      Subjective/Complaints:  Had a good day. No complaints.  Ambulatory with walker.  Anxious for discharge.  Objective: Vital Signs: Blood pressure 155/82, pulse 62, temperature 96.8 F (36 C), temperature source Oral, resp. rate 17, weight 104 lb 9.6 oz (47.446 kg), SpO2 98.00%. No results found. No results found for this basename: WBC, HGB, HCT, PLT,  in the last 72 hours No results found for this basename: NA, K, CL, CO, GLUCOSE, BUN, CREATININE, CALCIUM,  in the last 72 hours CBG (last 3)   Recent Labs  05/08/14 1621 05/08/14 2029 05/09/14 0718  GLUCAP 158* 213* 171*    Wt Readings from Last 3 Encounters:  05/05/14 104 lb 9.6 oz (47.446 kg)  05/04/14 229 lb 14.4 oz (104.282 kg)  05/04/14 229 lb 14.4 oz (104.282 kg)    Intake/Output Summary (Last 24 hours) at 05/09/14 0740 Last data filed at 05/09/14 0456  Gross per 24 hour  Intake    600 ml  Output   1400 ml  Net   -800 ml    Patient Vitals for the past 24 hrs:  BP Temp Temp src Pulse Resp SpO2  05/09/14 0630 155/82 mmHg - - - - -  05/09/14 0452 155/82 mmHg 96.8 F (36 C) Oral 62 17 98 %  05/09/14 0036 118/68 mmHg - - - - -   05/08/14 2111 123/71 mmHg 97.1 F (36.2 C) Oral 72 17 98 %  05/08/14 1405 164/81 mmHg 97.5 F (36.4 C) Oral 61 18 100 %    .  Physical Exam:  Sitting in chair. No distress. obese  Eyes:  Pupils round and reactive to light  Mouth: edentulous, mucosa pink and moist  Head: craniectomy site sunken in. No drainage. Wound well approximated  Neck: Normal range of motion. Neck supple. No thyromegaly present.  Cardiovascular:  Cardiac rate controlled without murmur  Respiratory: Effort normal and breath sounds normal. No respiratory distress.  GI: Soft. Bowel sounds are normal. He exhibits no distension.  Flap site healing nicely Neurological: He is alert.  Ambulatory with walker  Skin:  Intact other than surgical sites  Psych: pleasant, cooperative, non-impulsive, non-anxious   Assessment/Plan: 1. Functional deficits secondary to right SDH s/p ultimate craniectomy  which require 3+ hours per day of interdisciplinary therapy in a comprehensive inpatient rehab setting. 2. DVT Prophylaxis/Anticoagulation: SDH. Chronic Coumadin discontinued secondary to SDH  3. Seizure prophylaxis. Keppra 500 mg twice a day. Monitor for any seizure activity  4. Atrial fibrillation. Chronic Coumadin discontinued. Continue Cardizem 60 mg 4 times a day, Coreg 25 mg twice a day  -  encourage adequate fluid intake  5. Pseudomonas UTI. Levaquin  Completed 6 History of CAD with CABG. Chest pain or shortness of breath. Continue to monitor  7. Hyperlipidemia. Lipitor  8. Hyperglycemia. Monitor blood sugars while on Decadron taper- sugars still elevated but rending down  LOS (Days) 5 A FACE TO FACE EVALUATION WAS PERFORMED  Marletta Lor 05/09/2014 7:40 AM

## 2014-05-10 ENCOUNTER — Inpatient Hospital Stay (HOSPITAL_COMMUNITY): Payer: BC Managed Care – PPO | Admitting: *Deleted

## 2014-05-10 ENCOUNTER — Inpatient Hospital Stay (HOSPITAL_COMMUNITY): Payer: BC Managed Care – PPO

## 2014-05-10 ENCOUNTER — Encounter (HOSPITAL_COMMUNITY): Payer: BC Managed Care – PPO

## 2014-05-10 ENCOUNTER — Inpatient Hospital Stay (HOSPITAL_COMMUNITY): Payer: BC Managed Care – PPO | Admitting: Speech Pathology

## 2014-05-10 LAB — GLUCOSE, CAPILLARY
Glucose-Capillary: 130 mg/dL — ABNORMAL HIGH (ref 70–99)
Glucose-Capillary: 124 mg/dL — ABNORMAL HIGH (ref 70–99)
Glucose-Capillary: 111 mg/dL — ABNORMAL HIGH (ref 70–99)
Glucose-Capillary: 139 mg/dL — ABNORMAL HIGH (ref 70–99)

## 2014-05-10 NOTE — Progress Notes (Signed)
Occupational Therapy Session Note  Patient Details  Name: Alejandro [name] MRN: [id number] Date of Birth: 02-May-1946  Today's Date: 05/10/2014 Time: 1030-1130 and 1330-1430 Time Calculation: 60 min and 60 min    Short Term Goals: Week 1:  OT Short Term Goal 1 (Week 1): Focus on LTGs d/t short ELOS  Skilled Therapeutic Interventions/Progress Updates:   Session 1: Pt seen for ADL retraining with focus on initiation, sustained attention, sequencing, command following, functional transfers and dynamic standing balance. Pt received sitting in w/c with helmet donned. Pt retrieved clothing prior to bathing with min cues to gather all items. Ambulated to bathroom with CGA and min cues for safety and use of RW. Transferred to shower chair with min assist and min cues. Completed bathing at shower level with min assist and mod cues for sequencing steps. Attended to task ~2 minutes before requiring redirection and following simple commands ~80%. Pt demonstrated difficulty with LB dressing secondary to fatigue and difficulty reaching feet. Pt presenting with flat affect and fatigue throughout session.  Ambulated with supervision and min cues to put away dirty clothes and completed oral care in standing with mod cues for identification of materials. Transferred to w/c with supervision and min cues. Engaged in opening/reading mail with pt initiating opening and reading letter out loud with min cues. Pt left sitting in w/c with helmet donned and all needs in reach.   Session 2: Pt seen for 1:1 skilled OT session with focus on sustained attention, activity tolerance, functional ambulation, dynamic standing balance, and sequencing. Pt received sitting in w/c with helmet donned. Engaged in therapeutic obstacle course outside on uneven terrain to challenge balance and activity tolerance during functional ambulation w/ RW. Pt required supervision-CGA and min cues during task with pt attending to task ~90%. Performed plant  watering activity in standing w/ RW requiring CGA for safety and mod cues for sequencing with pt initiating side stepping. Engaged in therapeutic bean bag toss to target in standing w/ RW requiring supervision during reach to side and across midline. Pt demonstrated increased problem solving with adding score requiring min cues. Graded task up to anterior reach forward to floor with pt requiring mod assist for dynamic standing balance and mod cues for command following. Pt followed two step command to connect two tasks as pt able to sequence steps correctly and sustain attention to task with mod cues. Returned to room and left sitting in w/c with helmet donned and all needs in reach.   Therapy Documentation Precautions:  Precautions Precautions: Fall Precaution Comments: Helmet when OOB Restrictions Weight Bearing Restrictions: No General:   Vital Signs:   Pain: Pt reporting no pain at this time.   See FIM for current functional status  Therapy/Group: Individual Therapy  Fermin Schwab Corinthian Kemler 05/10/2014, 1:13 PM

## 2014-05-10 NOTE — Progress Notes (Signed)
[facility] PHYSICAL MEDICINE & REHABILITATION     PROGRESS NOTE    Subjective/Complaints: Quiet weekend. Getting a little stir crazy. Anxious to get home.   Objective: Vital Signs: Blood pressure 118/68, pulse 76, temperature 98 F (36.7 C), temperature source Oral, resp. rate 18, weight 47.446 kg (104 lb 9.6 oz), SpO2 99.00%. No results found. No results found for this basename: WBC, HGB, HCT, PLT,  in the last 72 hours No results found for this basename: NA, K, CL, CO, GLUCOSE, BUN, CREATININE, CALCIUM,  in the last 72 hours CBG (last 3)   Recent Labs  05/09/14 1637 05/09/14 2057 05/10/14 0731  GLUCAP 230* 236* 130*    Wt Readings from Last 3 Encounters:  05/05/14 47.446 kg (104 lb 9.6 oz)  05/04/14 104.282 kg (229 lb 14.4 oz)  05/04/14 104.282 kg (229 lb 14.4 oz)    Physical Exam:  Sitting in chair. No distress. obese  Eyes:  Pupils round and reactive to light  Mouth: edentulous, mucosa pink and moist  Head: craniectomy site sunken in. No drainage. Wound well approximated  Neck: Normal range of motion. Neck supple. No thyromegaly present.  Cardiovascular:  Cardiac rate controlled without murmur  Respiratory: Effort normal and breath sounds normal. No respiratory distress.  GI: Soft. Bowel sounds are normal. He exhibits no distension.  Flap site dressed/intact. Minimally tender Neurological: He is alert.  Makes good eye contact with examiner. Far left upper quadrant of vision may be affected. No neglect. appropriate for name, place, age. Bilat mittens in place.Followed simple commands. Moves all 4 limbs with ease. Can follow simple commands. Improved insight and awareness. Recalls  biographical information. Non-impulsive. Sensory exam intact. dtr's 1+.  Skin:  Intact other than surgical sites  Psych: pleasant, cooperative, non-impulsive, non-anxious   Assessment/Plan: 1. Functional deficits secondary to right SDH s/p ultimate craniectomy  which require 3+ hours  per day of interdisciplinary therapy in a comprehensive inpatient rehab setting. Physiatrist is providing close team supervision and 24 hour management of active medical problems listed below. Physiatrist and rehab team continue to assess barriers to discharge/monitor patient progress toward functional and medical goals. FIM: FIM - Bathing Bathing Steps Patient Completed: Chest;Right Arm;Left Arm;Abdomen;Front perineal area;Right upper leg;Left upper leg;Right lower leg (including foot);Left lower leg (including foot);Buttocks Bathing: 4: Steadying assist  FIM - Upper Body Dressing/Undressing Upper body dressing/undressing steps patient completed: Thread/unthread right sleeve of pullover shirt/dresss;Thread/unthread left sleeve of pullover shirt/dress;Put head through opening of pull over shirt/dress;Pull shirt over trunk Upper body dressing/undressing: 5: Set-up assist to: Obtain clothing/put away FIM - Lower Body Dressing/Undressing Lower body dressing/undressing steps patient completed: Thread/unthread right pants leg;Thread/unthread left pants leg;Pull pants up/down;Don/Doff right sock;Don/Doff left sock;Thread/unthread right underwear leg;Thread/unthread left underwear leg;Pull underwear up/down Lower body dressing/undressing: 4: Min-Patient completed 75 plus % of tasks  FIM - Toileting Toileting steps completed by patient: Adjust clothing prior to toileting;Performs perineal hygiene;Adjust clothing after toileting Toileting Assistive Devices: Grab bar or rail for support Toileting: 4: Steadying assist  FIM - Radio producer Devices: Walker;Grab bars Toilet Transfers: 4-From toilet/BSC: Min A (steadying Pt. > 75%);4-To toilet/BSC: Min A (steadying Pt. > 75%)  FIM - Bed/Chair Transfer Bed/Chair Transfer Assistive Devices: Arm rests;Walker Bed/Chair Transfer: 4: Bed > Chair or W/C: Min A (steadying Pt. > 75%);5: Supine > Sit: Supervision (verbal cues/safety  issues)  FIM - Locomotion: Wheelchair Distance: 150 Locomotion: Wheelchair: 5: Travels 150 ft or more: maneuvers on rugs and over door sills with supervision,  cueing or coaxing FIM - Locomotion: Ambulation Locomotion: Ambulation Assistive Devices: Walker - Rolling Ambulation/Gait Assistance: 4: Min guard;5: Supervision Locomotion: Ambulation: 4: Travels 150 ft or more with minimal assistance (Pt.>75%)  Comprehension Comprehension Mode: Auditory Comprehension: 5-Follows basic conversation/direction: With no assist  Expression Expression Mode: Verbal Expression: 5-Expresses basic needs/ideas: With no assist  Social Interaction Social Interaction: 6-Interacts appropriately with others with medication or extra time (anti-anxiety, antidepressant).  Problem Solving Problem Solving: 5-Solves basic problems: With no assist  Memory Memory: 4-Recognizes or recalls 75 - 89% of the time/requires cueing 10 - 24% of the time  Medical Problem List and Plan:  1. 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   -received protective helmet   -removed staples  2. DVT Prophylaxis/Anticoagulation: SDH. Chronic Coumadin discontinued secondary to SDH  3. Pain Management: Tylenol as needed  4. History of alcohol tobacco abuse. Monitor for any signs of withdrawal. Provide counseling  5. Neuropsych: This patient is capable of making decisions on his own behalf.  6. Seizure prophylaxis. Keppra 500 mg twice a day. Monitor for any seizure activity  7. Atrial fibrillation. Chronic Coumadin discontinued. Continue Cardizem 60 mg 4 times a day, Coreg 25 mg twice a day  -encourage adequate fluid intake  8. Pseudomonas UTI. Levaquin initiated 05/02/2014 --continue through today 9. History of CAD with CABG. Chest  pain or shortness of breath. Continue to monitor  10. Hyperlipidemia. Lipitor  11. Hyperglycemia. Monitor blood sugars while on Decadron taper- sugars still elevated but showing improvement  -likely pre-diabetic/diabetc   -check Hgb A1C  LOS (Days) 6 A FACE TO FACE EVALUATION WAS PERFORMED  Meredith Staggers 05/10/2014 8:25 AM

## 2014-05-10 NOTE — Progress Notes (Signed)
Physical Therapy Session Note  Patient Details  Name: Alejandro [name] MRN: [id number] Date of Birth: December 07, 1945  Today's Date: 05/10/2014 Time: 0930-1030 Time Calculation (min): 60 min  Short Term Goals: Week 1:  PT Short Term Goal 1 (Week 1): STGs=LTGs secondary to ELOS  Skilled Therapeutic Interventions/Progress Updates:    Patient received sitting upright in bed. Session focused on short term/working memory during functional mobility with emphasis on RW management and safety. Sitting EOB, patient dons shoes, functional/household ambulation with RW and close supervision to bathroom, performed all aspects of toileting with supervision. Functional ambulation in controlled environment >150' x2 with RW and supervision, verbal cues for environment, as patient tends to not leave enough room for objects on L.   Stair negotiation: Attempted with only one handrail, ascending both forwards and sideways and patient unable to ascend steps. With 2 handrails, negotiated 5 steps with minA, cues for ascending with stronger LE (R LE). Curb negotiation x2 with RW and close supervision, demonstration prior, cues for maintaining BOS within RW. Obstacle course x2 including unsteady mat, 4 cones to weave in/out, and 3 bolsters to step over; patient requires minA-supervision, cues for maintaining BOS within RW. Patient left sitting in wheelchair with all needs within reach, trial of no seatbelt, RN aware.  Therapy Documentation Precautions:  Precautions Precautions: Fall Precaution Comments: Helmet when OOB Restrictions Weight Bearing Restrictions: No Pain: Pain Assessment Pain Assessment: No/denies pain Pain Score: 0-No pain Locomotion : Ambulation Ambulation/Gait Assistance: 5: Supervision   See FIM for current functional status  Therapy/Group: Individual Therapy  Lillia Abed. Tyrene Nader, PT, DPT 05/10/2014, 11:26 AM

## 2014-05-10 NOTE — Progress Notes (Signed)
Note reviewed and this therapist agrees with the information provided.

## 2014-05-10 NOTE — Progress Notes (Signed)
Physical Therapy Session Note  Patient Details  Name: Alejandro [name] MRN: [id number] Date of Birth: May 01, 1946  Today's Date: 05/10/2014 Time: 0312-8118 Time Calculation (min): 25 min  Skilled Therapeutic Interventions/Progress Updates:  1:1. Pt received sitting in w/c, ready for therapy. Focus this session on functional ambulation and cognitive remediation. Pt able to safely ambulate 160'x2 room<>day room w/ RW and overall supervision.   Pt engaged in game of gin rummy to address sustained attention, new learning, memory and mental flexibility. Game played with open faced cards for therapist to assist, req mod cues throughout to [place]ter of a kind and 4 of a kind." Pt with difficulty grasping game, despite cues and ultimately selected 7 cards of the same suite.   Pt sitting in w/c at end of session w/ all needs in reach and wife in room.   Therapy Documentation Precautions:  Precautions Precautions: Fall Precaution Comments: Helmet when OOB Restrictions Weight Bearing Restrictions: No  See FIM for current functional status  Therapy/Group: Individual Therapy  Gilmore Laroche 05/10/2014, 5:35 PM

## 2014-05-10 NOTE — Progress Notes (Signed)
Speech Language Pathology Daily Session Note  Patient Details  Name: Alejandro [name] MRN: [id number] Date of Birth: May 02, 1946  Today's Date: 05/10/2014 Time: 3846-6599 Time Calculation (min): 55 min  Short Term Goals: Week 1: SLP Short Term Goal 1 (Week 1): Pt will demonstrate selective attention in a mildly distracting enviornment for 45 minutes with supervision verbal cues.  SLP Short Term Goal 2 (Week 1): Pt will utilize external memory aids to recall new, daily information with supervision multimodal cues.  SLP Short Term Goal 3 (Week 1): Pt will identify 2 physical and 2 cognitive deficits with Min A multimodal cues.  SLP Short Term Goal 4 (Week 1): Pt will demonstrate functional problem solving for basic and familiar tasks with supervision multimodal cues.  SLP Short Term Goal 5 (Week 1): Pt will demonstrate efficient mastication of regular textures without overt s/s of aspiration with Mod I.  SLP Short Term Goal 6 (Week 1): Pt will consume current diet with Mod I to minimize overt s/s of aspiration.   Skilled Therapeutic Interventions:  Pt was seen for skilled speech therapy targeting executive function for medication management.  Pt organized pills of varying dosages and frequencies into a pill box for 75% accuracy independently, improving to 100% accuracy with min-mod assist for planning and organizing.  Pt was able to recognize errors in sorting with supervision; however, he required mod assist to implement a strategy to correct errors due to working memory impairments and processing delays.  Furthermore, pt was easily distracted by external stimuli and benefited from min cuing for redirection to structured tasks.  Pt was modified independent for use of external aids to facilitate improve functional way finding from the treatment room to his room.  Continue per current plan of care.    FIM:  Comprehension Comprehension Mode: Auditory Comprehension: 5-Understands basic 90% of the  time/requires cueing < 10% of the time Expression Expression Mode: Verbal Expression: 5-Expresses basic 90% of the time/requires cueing < 10% of the time. Social Interaction Social Interaction: 5-Interacts appropriately 90% of the time - Needs monitoring or encouragement for participation or interaction. Problem Solving Problem Solving: 4-Solves basic 75 - 89% of the time/requires cueing 10 - 24% of the time Memory Memory: 4-Recognizes or recalls 75 - 89% of the time/requires cueing 10 - 24% of the time  Pain Pain Assessment Pain Assessment: No/denies pain  Therapy/Group: Individual Therapy  [name], M.A. CCC-SLP   Alejandro [name] 05/10/2014, 4:38 PM

## 2014-05-11 ENCOUNTER — Encounter (HOSPITAL_COMMUNITY): Payer: Self-pay | Admitting: Radiology

## 2014-05-11 ENCOUNTER — Inpatient Hospital Stay (HOSPITAL_COMMUNITY): Payer: BC Managed Care – PPO | Admitting: *Deleted

## 2014-05-11 ENCOUNTER — Inpatient Hospital Stay (HOSPITAL_COMMUNITY): Payer: BC Managed Care – PPO | Admitting: Rehabilitation

## 2014-05-11 ENCOUNTER — Encounter (HOSPITAL_COMMUNITY): Payer: BC Managed Care – PPO

## 2014-05-11 ENCOUNTER — Inpatient Hospital Stay (HOSPITAL_COMMUNITY): Payer: BC Managed Care – PPO

## 2014-05-11 ENCOUNTER — Inpatient Hospital Stay (HOSPITAL_COMMUNITY): Payer: BC Managed Care – PPO | Admitting: Speech Pathology

## 2014-05-11 LAB — URINALYSIS, ROUTINE W REFLEX MICROSCOPIC
Bilirubin Urine: NEGATIVE
GLUCOSE, UA: 100 mg/dL — AB
Ketones, ur: NEGATIVE mg/dL
Leukocytes, UA: NEGATIVE
Nitrite: NEGATIVE
PH: 6.5 (ref 5.0–8.0)
Protein, ur: NEGATIVE mg/dL
SPECIFIC GRAVITY, URINE: 1.02 (ref 1.005–1.030)
Urobilinogen, UA: 0.2 mg/dL (ref 0.0–1.0)

## 2014-05-11 LAB — CBC
HEMATOCRIT: 33.8 % — AB (ref 39.0–52.0)
HEMOGLOBIN: 11.8 g/dL — AB (ref 13.0–17.0)
MCH: 30.2 pg (ref 26.0–34.0)
MCHC: 34.9 g/dL (ref 30.0–36.0)
MCV: 86.4 fL (ref 78.0–100.0)
Platelets: 230 10*3/uL (ref 150–400)
RBC: 3.91 MIL/uL — AB (ref 4.22–5.81)
RDW: 13.9 % (ref 11.5–15.5)
WBC: 21.3 10*3/uL — AB (ref 4.0–10.5)

## 2014-05-11 LAB — BASIC METABOLIC PANEL
BUN: 28 mg/dL — ABNORMAL HIGH (ref 6–23)
BUN: 31 mg/dL — ABNORMAL HIGH (ref 6–23)
CALCIUM: 8.5 mg/dL (ref 8.4–10.5)
CHLORIDE: 93 meq/L — AB (ref 96–112)
CO2: 27 mEq/L (ref 19–32)
CO2: 20 mEq/L (ref 19–32)
Calcium: 8.5 mg/dL (ref 8.4–10.5)
Chloride: 90 mEq/L — ABNORMAL LOW (ref 96–112)
Creatinine, Ser: 0.86 mg/dL (ref 0.50–1.35)
Creatinine, Ser: 0.85 mg/dL (ref 0.50–1.35)
GFR calc Af Amer: >90 mL/min (ref >90)
GFR calc Af Amer: >90 mL/min (ref >90)
GFR calc non Af Amer: 87 mL/min — ABNORMAL LOW (ref >90)
GFR, EST NON AFRICAN AMERICAN: 88 mL/min — AB (ref >90)
GLUCOSE: 127 mg/dL — AB (ref 70–99)
Glucose, Bld: 160 mg/dL — ABNORMAL HIGH (ref 70–99)
POTASSIUM: 5.7 meq/L — AB (ref 3.7–5.3)
POTASSIUM: 5 meq/L (ref 3.7–5.3)
SODIUM: 129 meq/L — AB (ref 137–147)
Sodium: 130 mEq/L — ABNORMAL LOW (ref 137–147)

## 2014-05-11 LAB — GLUCOSE, CAPILLARY
GLUCOSE-CAPILLARY: 122 mg/dL — AB (ref 70–99)
Glucose-Capillary: 178 mg/dL — ABNORMAL HIGH (ref 70–99)
Glucose-Capillary: 185 mg/dL — ABNORMAL HIGH (ref 70–99)
Glucose-Capillary: 219 mg/dL — ABNORMAL HIGH (ref 70–99)

## 2014-05-11 LAB — SODIUM, URINE, RANDOM: SODIUM UR: 59 meq/L

## 2014-05-11 LAB — HEMOGLOBIN A1C
Hgb A1c MFr Bld: 6.3 % — ABNORMAL HIGH (ref <5.7)
Mean Plasma Glucose: 134 mg/dL — ABNORMAL HIGH (ref <117)

## 2014-05-11 LAB — URINE MICROSCOPIC-ADD ON

## 2014-05-11 LAB — OSMOLALITY, URINE: Osmolality, Ur: 609 mOsm/kg (ref 390–1090)

## 2014-05-11 LAB — OSMOLALITY: Osmolality: 279 mOsm/kg (ref 275–300)

## 2014-05-11 MED ORDER — SODIUM CHLORIDE 1 G PO TABS
1.0000 g | ORAL_TABLET | Freq: Two times a day (BID) | ORAL | Status: DC
  Administered 2014-05-11 – 2014-05-14 (×6): 1 g via ORAL
  Filled 2014-05-11 (×8): qty 1

## 2014-05-11 NOTE — Progress Notes (Signed)
[facility]IC[facility]ABILITATION     PROGRESS NOTE    Subjective/Complaints: No new issues. Feeling better. Wants to get home.   Objective: Vital Signs: Blood pressure 137/68, pulse 69, temperature 97.3 F (36.3 C), temperature source Oral, resp. rate 18, weight 47.446 kg (104 lb 9.6 oz), SpO2 98.00%. No results found.  Recent Labs  05/11/14 0605  WBC 21.3*  HGB 11.8*  HCT 33.8*  PLT 230    Recent Labs  05/11/14 0605  NA 130*  K 5.7*  CL 93*  GLUCOSE 127*  BUN 28*  CREATININE 0.86  CALCIUM 8.5   CBG (last 3)   Recent Labs  05/10/14 1631 05/10/14 2050 05/11/14 0716  GLUCAP 111* 139* 122*    Wt Readings from Last 3 Encounters:  05/05/14 47.446 kg (104 lb 9.6 oz)  05/04/14 104.282 kg (229 lb 14.4 oz)  05/04/14 104.282 kg (229 lb 14.4 oz)    Physical Exam:  Sitting in chair. No distress. obese  Eyes:  Pupils round and reactive to light  Mouth: edentulous, mucosa pink and moist  Head: craniectomy site sunken in. No drainage. Wound well approximated  Neck: Normal range of motion. Neck supple. No thyromegaly present.  Cardiovascular:  Cardiac rate controlled without murmur  Respiratory: Effort normal and breath sounds normal. No respiratory distress.  GI: Soft. Bowel sounds are normal. He exhibits no distension.  Flap site dressed/intact. Minimally tender Neurological: He is alert.  Makes good eye contact with examiner. Far left upper quadrant of vision may be affected. No neglect. appropriate for name, place, age. Bilat mittens in place.Followed simple commands. Moves all 4 limbs with ease. Can follow simple commands. Improved insight and awareness. Recalls  biographical information. Non-impulsive. Sensory exam intact. dtr's 1+.  Skin:  Intact other without drainage Psych: pleasant, cooperative, non-impulsive, non-anxious   Assessment/Plan: 1. Functional deficits secondary to right SDH s/p ultimate craniectomy  which require 3+ hours per  day of interdisciplinary therapy in a comprehensive inpatient rehab setting. Physiatrist is providing close team supervision and 24 hour management of active medical problems listed below. Physiatrist and rehab team continue to assess barriers to discharge/monitor patient progress toward functional and medical goals. FIM: FIM - Bathing Bathing Steps Patient Completed: Chest;Right Arm;Left Arm;Abdomen;Front perineal area;Buttocks;Right upper leg;Left upper leg;Right lower leg (including foot);Left lower leg (including foot) Bathing: 4: Steadying assist  FIM - Upper Body Dressing/Undressing Upper body dressing/undressing steps patient completed: Thread/unthread right sleeve of pullover shirt/dresss;Thread/unthread left sleeve of pullover shirt/dress;Put head through opening of pull over shirt/dress;Pull shirt over trunk Upper body dressing/undressing: 5: Set-up assist to: Obtain clothing/put away FIM - Lower Body Dressing/Undressing Lower body dressing/undressing steps patient completed: Thread/unthread right underwear leg;Thread/unthread left underwear leg;Pull underwear up/down;Thread/unthread right pants leg;Thread/unthread left pants leg;Pull pants up/down;Don/Doff left sock;Don/Doff left shoe;Fasten/unfasten left shoe Lower body dressing/undressing: 4: Min-Patient completed 75 plus % of tasks  FIM - Toileting Toileting steps completed by patient: Adjust clothing prior to toileting;Performs perineal hygiene;Adjust clothing after toileting Toileting Assistive Devices: Grab bar or rail for support Toileting: 4: Steadying assist  FIM - Radio producer Devices: Walker;Grab bars Toilet Transfers: 4-From toilet/BSC: Min A (steadying Pt. > 75%);4-To toilet/BSC: Min A (steadying Pt. > 75%)  FIM - Bed/Chair Transfer Bed/Chair Transfer Assistive Devices: Bed rails;HOB elevated;Arm rests Bed/Chair Transfer: 5: Supine > Sit: Supervision (verbal cues/safety issues);5: Chair  or W/C > Bed: Supervision (verbal cues/safety issues);5: Bed > Chair or W/C: Supervision (verbal cues/safety issues)  FIM - Locomotion:  Wheelchair Distance: 150 Locomotion: Wheelchair: 0: Activity did not occur FIM - Locomotion: Ambulation Locomotion: Ambulation Assistive Devices: Administrator Ambulation/Gait Assistance: 5: Supervision Locomotion: Ambulation: 5: Travels 150 ft or more with supervision/safety issues  Comprehension Comprehension Mode: Auditory Comprehension: 5-Understands basic 90% of the time/requires cueing < 10% of the time  Expression Expression Mode: Verbal Expression: 5-Expresses basic 90% of the time/requires cueing < 10% of the time.  Social Interaction Social Interaction: 5-Interacts appropriately 90% of the time - Needs monitoring or encouragement for participation or interaction.  Problem Solving Problem Solving: 4-Solves basic 75 - 89% of the time/requires cueing 10 - 24% of the time  Memory Memory: 4-Recognizes or recalls 75 - 89% of the time/requires cueing 10 - 24% of the time  Medical Problem List and Plan:  1. 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   -continue protective helmet  2. DVT Prophylaxis/Anticoagulation: SDH. Chronic Coumadin discontinued secondary to SDH  3. Pain Management: Tylenol as needed  4. History of alcohol tobacco abuse. Monitor for any signs of withdrawal. Provide counseling  5. Neuropsych: This patient is capable of making decisions on his own behalf.  6. Seizure prophylaxis. Keppra 500 mg twice a day. Monitor for any seizure activity  7. Atrial fibrillation. Chronic Coumadin discontinued. Continue Cardizem 60 mg 4 times a day, Coreg 25 mg twice a day  -encourage adequate fluid intake  8. Pseudomonas UTI. Levaquin  initiated 05/02/2014 --continue through today 9. History of CAD with CABG. Chest pain or shortness of breath. Continue to monitor  10. Hyperlipidemia. Lipitor  11. Hyperglycemia. Monitor blood sugars while on Decadron taper- sugars much better yesterday  -likely pre-diabetic/diabetc   -  Hgb A1C pending 12. FEN: eating well, bun slightly elevated  -potassium trending up and sodium down--central causes  -check serum osmolality, repeat bmet today. Check urine sodium/osmolality this am  LOS (Days) 7 A FACE TO FACE EVALUATION WAS PERFORMED  Meredith Staggers 05/11/2014 8:29 AM

## 2014-05-11 NOTE — Patient Care Conference (Signed)
Inpatient RehabilitationTeam Conference and Plan of Care Update Date: 05/11/2014   Time: 3:10 PM    Patient Name: Alejandro [name]      Medical Record Number: [id number]  Date of Birth: [birth year] Sex: Male         Room/Bed: 4W23C/4W23C-01 Payor Info: Payor: [place] / Plan: [employer]BS PPO OUT OF STATE / Product Type: *No Product type* /    Admitting Diagnosis: R SDH hemicraniotomy  hemicraniectomy  Admit Date/Time:  05/04/2014  5:22 PM Admission Comments: No comment available   Primary Diagnosis:  <principal problem not specified> Principal Problem: <principal problem not specified>  Patient Active Problem List   Diagnosis Date Noted  . UTI (urinary tract infection) 05/03/2014  . Acute delirium 04/23/2014  . Alcohol withdrawal 04/23/2014  . Subdural hematoma 04/19/2014  . SDH (subdural hematoma) 04/19/2014  . Altered mental status 04/19/2014  . Accelerated hypertension 04/19/2014  . Encounter for therapeutic drug monitoring 02/09/2014  . Recurrent ventral hernia 10/22/2012  . Skin lesion 09/26/2012  . Hernia 09/26/2012  . SYSTOLIC HEART FAILURE, CHRONIC 06/15/2009  . SYSTOLIC HEART FAILURE, ACUTE ON CHRONIC 01/28/2009  . HYPERLIPIDEMIA-MIXED 01/27/2009  . ATRIAL FIBRILLATION 01/27/2009  . CAD, ARTERY BYPASS GRAFT 12/23/2008    Expected Discharge Date: Expected Discharge Date: 05/14/14  Team Members Present: Physician leading conference: Dr. Alger Simons Social Worker Present: Lennart Pall, LCSW Nurse Present: Elliot Cousin, RN PT Present: Melene Plan, PT OT Present: Roanna Epley, [place], OT SLP Present: Weston Anna, SLP;Other (comment) Elmyra Ricks Page, ST) Other (Discipline and Name): Danne Baxter, RN [facility]) PPS Coordinator present : Daiva Nakayama, RN, CRRN;Becky Alwyn Ren, PT     Current Status/Progress Goal Weekly Team Focus  Medical   right brain TBI, ?SIADH,   improve activity tolerance, balance  follow up neuro changes, improve electrolyte  abnormalities   Bowel/Bladder   Continent of bowel and bladder  Maintain   Monitor   Swallow/Nutrition/ Hydration   dys 3 textures, thin liquids with intermittent supervision   mod I with least restrictive diet   trials of regular textures to assess for diet upgrade   ADL's   min-supervision overall  mod I  cognitive remediation, safety awareness, dynamic standing balance, education, and activity tolerance   Mobility   supervision overall, except minA stairs  S-mod I overall  safety, cognitive remediation, functional mobility, balance, strengthening, education   Communication   n/a  n/a   n/a   Safety/Cognition/ Behavioral Observations  min assist   supervision-mod I  improving selective and alternating/divided attention during tasks    Pain   No c/o pain         Skin   right crainotomy incision healed scab over, incision to abdomen with guaze and transparent tape   No new skin breakdown/infection  Monitor skin q shift and prn    Rehab Goals Patient on target to meet rehab goals: Yes *See Care Plan and progress notes for long and short-term goals.  Barriers to Discharge: neuro deficits.     Possible Resolutions to Barriers:  rx neurological, medical issues, NMR, family ed    Discharge Planning/Teaching Needs:  home with wife to provide supervision/ assist as needed.      Team Discussion:  Was doing well with therapies this morning and team felt would be ready for d/c in a couple of days.  Declined this afternoon with increase in confusion and poor balance.  MD to recheck CT and labs.  Adding fluid restriction.  Possible  d/c end of week if no medical issues continue.  Revisions to Treatment Plan:  None   Continued Need for Acute Rehabilitation Level of Care: The patient requires daily medical management by a physician with specialized training in physical medicine and rehabilitation for the following conditions: Daily direction of a multidisciplinary physical rehabilitation  program to ensure safe treatment while eliciting the highest outcome that is of practical value to the patient.: Yes Daily medical management of patient stability for increased activity during participation in an intensive rehabilitation regime.: Yes Daily analysis of laboratory values and/or radiology reports with any subsequent need for medication adjustment of medical intervention for : Neurological problems;Post surgical problems  Lennart Pall 05/11/2014, 5:53 PM

## 2014-05-11 NOTE — Progress Notes (Signed)
Speech Language Pathology Daily Session Note  Patient Details  Name: Alejandro [name] MRN: [id number] Date of Birth: [birth year]  Today's Date: 05/11/2014 Time: 0930-1015 Time Calculation (min): 45 min  Short Term Goals: Week 1: SLP Short Term Goal 1 (Week 1): Pt will demonstrate selective attention in a mildly distracting enviornment for 45 minutes with supervision verbal cues.  SLP Short Term Goal 2 (Week 1): Pt will utilize external memory aids to recall new, daily information with supervision multimodal cues.  SLP Short Term Goal 3 (Week 1): Pt will identify 2 physical and 2 cognitive deficits with Min A multimodal cues.  SLP Short Term Goal 4 (Week 1): Pt will demonstrate functional problem solving for basic and familiar tasks with supervision multimodal cues.  SLP Short Term Goal 5 (Week 1): Pt will demonstrate efficient mastication of regular textures without overt s/s of aspiration with Mod I.  SLP Short Term Goal 6 (Week 1): Pt will consume current diet with Mod I to minimize overt s/s of aspiration.   Skilled Therapeutic Interventions:  Pt was seen for skilled speech therapy targeting executive function for medication management.  Pt required mod-max assist to recall function and frequency of personal medications when named.  Pt was noted with better recall of previously known medications versus new medications.  SLP provided pt with an external aid which listed medications as well as their frequency and function as a compensatory strategy to facilitate improved recall of medications between sessions.  During a structured safety reasoning task, pt was approximately 90% accurate for identifying safety issues in targeted pictures with supervision cuing; no overt safety issues noted during structured or unstructured tasks.  Continue per current plan of care   FIM:  Comprehension Comprehension: 5-Follows basic conversation/direction: With extra time/assistive device Expression Expression Mode:  Verbal Expression: 5-Expresses basic needs/ideas: With extra time/assistive device Social Interaction Social Interaction: 6-Interacts appropriately with others with medication or extra time (anti-anxiety, antidepressant). Problem Solving Problem Solving: 4-Solves basic 75 - 89% of the time/requires cueing 10 - 24% of the time Memory Memory: 4-Recognizes or recalls 75 - 89% of the time/requires cueing 10 - 24% of the time  Pain Pain Assessment Pain Assessment: No/denies pain Pain Score: 0-No pain  Therapy/Group: Individual Therapy  [name], M.A. CCC-SLP  [name] 05/11/2014, 12:49 PM

## 2014-05-11 NOTE — Progress Notes (Signed)
Note reviewed and accurately reflects treatment session.   

## 2014-05-11 NOTE — Progress Notes (Signed)
Physical Therapy Session Note  Patient Details  Name: Alejandro [name] MRN: [id number] Date of Birth: 1946/05/20  Today's Date: 05/11/2014 Time: 1015-1100 Time Calculation (min): 45 min  Short Term Goals: Week 1:  PT Short Term Goal 1 (Week 1): STGs=LTGs secondary to ELOS  Skilled Therapeutic Interventions/Progress Updates:   Pt received sitting in w/c in room, agreeable to therapy.  Pt requested to use restroom and per pt, RN states she needed clean urine sample.  Provided pt with clean urinal and pt ambulated to/from restroom with RW at S level.  Able to manage clothing and perform peri care all at S level.  Ambulated to sink to wash/dry hands, again at S level.  Requested to return to w/c for seated rest break.  Pt notably fatigued while resting and states "I just wish I could rest for a few days."  Wife came into room and discussed with her team conference and that team would likely choose D/C date today.  Wife and pt verbalized understanding.  Also discussed home set up and if pt had vanity near toilet in restroom, as pt relied heavily on it in restroom.  Pt ambulated to day room with RW at S level and remainder of session focused on negotiating RW around tight spaces and how to side step if unable to ambulate forward.  Again pt requires rest break prior to task and pt then required hinting cues to recall what we were going to perform.  Assisted pt back to room at end of session via w/c due to fatigue.  Wife present and all needs in reach.   Therapy Documentation Precautions:  Precautions Precautions: Fall Precaution Comments: Helmet when OOB Restrictions Weight Bearing Restrictions: No   Vital Signs: Therapy Vitals Pulse Rate: 77 BP: 126/78 mmHg Pain: Pt with no c/o pain but max c/o fatigue.  Pain Assessment Pain Assessment: No/denies pain See FIM for current functional status  Therapy/Group: Individual Therapy  [name] 05/11/2014, 12:13 PM

## 2014-05-11 NOTE — Progress Notes (Signed)
Physical Therapy Session Note  Patient Details  Name: ROMANI WILBON MRN: [id number] Date of Birth: 1946/01/08  Today's Date: 05/11/2014 Time: 1105-1200 and 9150-5697 Time Calculation (min): 55 min and 14 min  Short Term Goals: Week 1:  PT Short Term Goal 1 (Week 1): STGs=LTGs secondary to ELOS  Skilled Therapeutic Interventions/Progress Updates:    AM Session: Patient received sitting in wheelchair, wife present for family training/education session. Discussion about discharge planning, CLOF, goals, and f/u therapy. Patient and wife wanting patient to be discharged sooner rather than later, even if this means patient will require 24/7 supervision and will not achieve mod I goals. Patient performed all aspects of functional mobility during session at supervision level, cues required for sequencing/technique: functional transfers, functional ambulation x160' with RW in controlled environment and x30' in home environment of ADL apartment, stair negotiation x5 stairs with 2 handrails, curb negotiation x2 with RW, and car transfer (to van height). Wife educated about proper verbal cues to provide patient. Patient left sitting in wheelchair with all needs within reach and wife present.  PM Session: Patient received sitting in wheelchair, asleep and with significant posterior pelvic tilt, almost sliding out of wheelchair. Patient lethargic and reporting he is "very tired". Patient transferred wheelchair>bed with minA, 2 attempts for sit>stand, and demonstrates poor sequencing/management with RW. MinA for sit>supine, patient left supine in bed with all needs within reach and bed alarm set.  Therapy Documentation Precautions:  Precautions Precautions: Fall Precaution Comments: Helmet when OOB Restrictions Weight Bearing Restrictions: No Pain: Pain Assessment Pain Assessment: No/denies pain Pain Score: 0-No pain Locomotion : Ambulation Ambulation/Gait Assistance: 5: Supervision   See FIM for  current functional status  Therapy/Group: Individual Therapy  Lillia Abed. Arwa Yero, PT, DPT 05/11/2014, 12:23 PM

## 2014-05-11 NOTE — Progress Notes (Signed)
Occupational Therapy Note  Patient Details  Name: Alejandro [name] MRN: [id number] Date of Birth: [birth year] Today's Date: 05/11/2014  LTG goals downgraded to overall supervision for ADL and cognition after discussion with primary OT/COTA due to poor carryover. Family have reported they can provided 24/7 supervision with a planned d/c this week.   Alejandro [name] 05/11/2014, 2:38 PM

## 2014-05-11 NOTE — Plan of Care (Signed)
Problem: RH Balance Goal: LTG Patient will maintain dynamic standing balance (PT) LTG: Patient will maintain dynamic standing balance with assistance during mobility activities (PT)  Downgraded 05/11/14 secondary to slow progress and decreased carry over.  Problem: RH Bed to Chair Transfers Goal: LTG Patient will perform bed/chair transfers w/assist (PT) LTG: Patient will perform bed/chair transfers with assistance, with/without cues (PT).  Downgraded 05/11/14 secondary to slow progress and decreased carry over.  Problem: RH Furniture Transfers Goal: LTG Patient will perform furniture transfers w/assist (OT/PT LTG: Patient will perform furniture transfers with assistance (OT/PT).  Downgraded 05/11/14 secondary to slow progress and decreased carry over.  Problem: RH Floor Transfers Goal: LTG Patient will perform floor transfers w/assist (PT) LTG: Patient will perform floor transfers with assistance (PT).  Outcome: Not Applicable Date Met:  79/98/72 Discharged 05/11/14 secondary to decreased strength and decreased ability due to body habitus.  Problem: RH Ambulation Goal: LTG Patient will ambulate in controlled environment (PT) LTG: Patient will ambulate in a controlled environment, # of feet with assistance (PT).  Downgraded 05/11/14 secondary to slow progress and decreased carry over. Goal: LTG Patient will ambulate in home environment (PT) LTG: Patient will ambulate in home environment, # of feet with assistance (PT).  Downgraded 05/11/14 secondary to slow progress and decreased carry over. Goal: LTG Patient will ambulate in community environment (PT) LTG: Patient will ambulate in community environment, # of feet with assistance (PT).  Downgraded 05/11/14 secondary to slow progress and decreased carry over.  Problem: RH Stairs Goal: LTG Patient will ambulate up and down stairs w/assist (PT) LTG: Patient will ambulate up and down # of stairs with assistance (PT)  Downgraded 05/11/14 to use of 2  handrails secondary to slow progress and decreased carry over.  Problem: RH Memory Goal: LTG Patient demonstrate ability for day to day recall (PT) LTG: Patient will demonstrate ability for day to day recall/carryover during mobility activities with assist (PT)  Downgraded 05/11/14 secondary to slow progress and decreased carry over.

## 2014-05-11 NOTE — Progress Notes (Signed)
Occupational Therapy Session Note  Patient Details  Name: Alejandro [name] MRN: [id number] Date of Birth: January 15, 1946  Today's Date: 05/11/2014 Time: 0800-0900 Time Calculation: 60 min  Short Term Goals: Week 1:  OT Short Term Goal 1 (Week 1): Focus on LTGs d/t short ELOS  Skilled Therapeutic Interventions/Progress Updates:    Session 1: Pt seen for ADL retraining with focus on initiation, problem solving, awareness, functional transfers and activity tolerance. Pt received supine in bed. Pt oriented to person, place and date. Completed supine>sit with supervision and min cues to don helmet. Pt required mod cues to retrieve clothing prior to bathing. Ambulated w/ RW to bathroom with CGA and min cues for safety awareness. Transferred to shower chair with supervision and min cues. Performed bathing at shower level with min assist/cues for problem solving and initiation of sit>stand for peri hygiene with pt difficulty reaching his back side upon standing. Initiated dressing at EOB however pt requiring max cues today for awareness and sequencing of steps. Pt demonstrated difficulty with LB dressing secondary to fatigue and difficulty with anterior shift forward to reach feet. Presented foot stool and shoe horn however pt demonstrating poor frustration tolerance toward end of session. Returned to w/c via RW requiring min assist and mod cues. Pt left in w/c with helmet donned and all needs in reach.    Session 2: Pt seen for 1:1 skilled OT session with focus on sequencing, short term memory recall, awareness, functional transfers and activity tolerance. Pt received sitting in w/c with helmet on. Educated and provided demonstration of simulated walk in shower transfer using shower frame in rehab apartment. Practiced shower transfer 2x using RW and shower chair with pt requiring max multimodal cues for sequencing and redirection secondary to decreased short term memory, decreased command following, and decreased  awareness. With repetition pt able to complete with mod cues on final trial however pt fatiguing quickly throughout session requiring supervision-mod assist for sit<>stand and +2 assist on last attempt secondary to decreased activity tolerance. Pt returned to room and left in w/c with helmet donned and all needs in reach.        Therapy Documentation Precautions:  Precautions Precautions: Fall Precaution Comments: Helmet when OOB Restrictions Weight Bearing Restrictions: No General:   Vital Signs: Therapy Vitals Pulse Rate: 71 BP: 125/68 mmHg Pain: Pt reporting no pain at this time.   See FIM for current functional status  Therapy/Group: Individual Therapy  Fermin Schwab Lajuane Leatham 05/11/2014, 12:55 PM

## 2014-05-12 ENCOUNTER — Encounter (HOSPITAL_COMMUNITY): Payer: BC Managed Care – PPO

## 2014-05-12 ENCOUNTER — Inpatient Hospital Stay (HOSPITAL_COMMUNITY): Payer: BC Managed Care – PPO | Admitting: *Deleted

## 2014-05-12 ENCOUNTER — Inpatient Hospital Stay (HOSPITAL_COMMUNITY): Payer: BC Managed Care – PPO

## 2014-05-12 ENCOUNTER — Inpatient Hospital Stay (HOSPITAL_COMMUNITY): Payer: BC Managed Care – PPO | Admitting: Speech Pathology

## 2014-05-12 DIAGNOSIS — S069XAA Unspecified intracranial injury with loss of consciousness status unknown, initial encounter: Secondary | ICD-10-CM

## 2014-05-12 DIAGNOSIS — I2510 Atherosclerotic heart disease of native coronary artery without angina pectoris: Secondary | ICD-10-CM

## 2014-05-12 DIAGNOSIS — I5022 Chronic systolic (congestive) heart failure: Secondary | ICD-10-CM

## 2014-05-12 DIAGNOSIS — W19XXXA Unspecified fall, initial encounter: Secondary | ICD-10-CM

## 2014-05-12 DIAGNOSIS — I4891 Unspecified atrial fibrillation: Secondary | ICD-10-CM

## 2014-05-12 DIAGNOSIS — S069X9A Unspecified intracranial injury with loss of consciousness of unspecified duration, initial encounter: Secondary | ICD-10-CM

## 2014-05-12 LAB — GLUCOSE, CAPILLARY
GLUCOSE-CAPILLARY: 195 mg/dL — AB (ref 70–99)
Glucose-Capillary: 140 mg/dL — ABNORMAL HIGH (ref 70–99)
Glucose-Capillary: 201 mg/dL — ABNORMAL HIGH (ref 70–99)
Glucose-Capillary: 198 mg/dL — ABNORMAL HIGH (ref 70–99)

## 2014-05-12 LAB — BASIC METABOLIC PANEL
BUN: 28 mg/dL — ABNORMAL HIGH (ref 6–23)
CO2: 24 meq/L (ref 19–32)
CREATININE: 0.8 mg/dL (ref 0.50–1.35)
Calcium: 8.1 mg/dL — ABNORMAL LOW (ref 8.4–10.5)
Chloride: 96 mEq/L (ref 96–112)
GFR calc Af Amer: >90 mL/min (ref >90)
GFR calc non Af Amer: 90 mL/min — ABNORMAL LOW (ref >90)
Glucose, Bld: 138 mg/dL — ABNORMAL HIGH (ref 70–99)
Potassium: 4.9 mEq/L (ref 3.7–5.3)
Sodium: 133 mEq/L — ABNORMAL LOW (ref 137–147)

## 2014-05-12 NOTE — Progress Notes (Signed)
[place] PHYSICAL MEDICINE & REHABILITATION     PROGRESS NOTE    Subjective/Complaints: Slept fairly well. Denies pain. Able to sleep. Still anxious to get home. A  review of systems has been performed and if not noted above is otherwise negative.    Objective: Vital Signs: Blood pressure 152/83, pulse 77, temperature 97.2 F (36.2 C), temperature source Oral, resp. rate 18, weight 47.446 kg (104 lb 9.6 oz), SpO2 97.00%. 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  EXAM: CT HEAD WITHOUT CONTRAST  TECHNIQUE: Contiguous axial images were obtained from the base of the skull through the vertex without intravenous contrast.  COMPARISON:  04/28/2014.  FINDINGS: Post right temporal -frontal craniectomy for drainage of subdural hematoma. The material utilized to close the craniectomy site is dehiscent and there is a large adjacent subcutaneous cerebral spinal fluid collection. The gas contain within this postoperative fluid collection and small amount of pneumocephalus has cleared in the interim.  Right temporal lobe hemorrhage has cleared with large area of posttraumatic encephalomalacia.  Remote small right cerebellar infarcts.  White matter type changes otherwise may represent result of small vessel disease or shear type injuries without CT evidence of large acute infarct.  No intracranial hemorrhage detected.  No hydrocephalus.  Mastoid air cells, middle ear cavities and majority of paranasal sinuses are clear with very minimal mucosal thickening of ethmoid sinus air cells and inferior medial aspect right maxillary sinus.  Vascular calcifications.  IMPRESSION: Post right temporal -frontal craniectomy for drainage of subdural hematoma. The material utilized to close the craniectomy site is dehiscent and there is a large adjacent subcutaneous cerebral spinal fluid collection.  Right temporal lobe hemorrhage has cleared with large area of  posttraumatic encephalomalacia noted.  Remote small right cerebellar infarcts.  White matter type changes may represent result of small vessel disease or shear type injuries without CT evidence of large acute infarct.  No intracranial hemorrhage detected.   Electronically Signed   By: Chauncey Cruel M.D.   On: 05/11/2014 18:11    Recent Labs  05/11/14 0605  WBC 21.3*  HGB 11.8*  HCT 33.8*  PLT 230    Recent Labs  05/11/14 0605 05/11/14 1323  NA 130* 129*  K 5.7* 5.0  CL 93* 90*  GLUCOSE 127* 160*  BUN 28* 31*  CREATININE 0.86 0.85  CALCIUM 8.5 8.5   CBG (last 3)   Recent Labs  05/11/14 1808 05/11/14 2117 05/12/14 0727  GLUCAP 185* 219* 140*    Wt Readings from Last 3 Encounters:  05/05/14 47.446 kg (104 lb 9.6 oz)  05/04/14 104.282 kg (229 lb 14.4 oz)  05/04/14 104.282 kg (229 lb 14.4 oz)    Physical Exam:  Sitting in chair. No distress. obese  Eyes:  Pupils round and reactive to light  Mouth: edentulous, mucosa pink and moist  Head: craniectomy swollen/soft. No drainage. Wound well approximated  Neck: Normal range of motion. Neck supple. No thyromegaly present.  Cardiovascular:  Cardiac rate controlled without murmur  Respiratory: Effort normal and breath sounds normal. No respiratory distress.  GI: Soft. Bowel sounds are normal. He exhibits no distension.  Flap site dressed/intact. Minimally tender Neurological: He is alert.  Makes good eye contact with examiner. Far left upper quadrant of vision may be affected. No neglect. appropriate for name, place, age. Bilat mittens in place.Followed simple commands. Moves all 4 limbs with ease. Can follow simple commands. Improved insight and awareness.  Recalls  biographical information. Non-impulsive. Sensory exam intact. dtr's 1+.  Skin:  Intact other without drainage Psych: pleasant, cooperative, non-impulsive, non-anxious   Assessment/Plan: 1. 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 Physiatrist is providing close team supervision and 24 hour management of active medical problems listed below. Physiatrist and rehab team continue to assess barriers to discharge/monitor patient progress toward functional and medical goals. FIM: FIM - Bathing Bathing Steps Patient Completed: Chest;Right Arm;Left Arm;Front perineal area;Abdomen;Right upper leg;Left upper leg;Right lower leg (including foot);Left lower leg (including foot) Bathing: 4: Min-Patient completes 8-9 44f 10 parts or 75+ percent  FIM - Upper Body Dressing/Undressing Upper body dressing/undressing steps patient completed: Thread/unthread right sleeve of pullover shirt/dresss;Thread/unthread left sleeve of pullover shirt/dress;Put head through opening of pull over shirt/dress;Pull shirt over trunk Upper body dressing/undressing: 5: Set-up assist to: Obtain clothing/put away FIM - Lower Body Dressing/Undressing Lower body dressing/undressing steps patient completed: Thread/unthread right underwear leg;Thread/unthread left underwear leg;Pull underwear up/down;Thread/unthread right pants leg;Thread/unthread left pants leg;Pull pants up/down;Don/Doff right sock;Don/Doff left sock;Don/Doff right shoe;Don/Doff left shoe;Fasten/unfasten left shoe Lower body dressing/undressing: 4: Min-Patient completed 75 plus % of tasks  FIM - Toileting Toileting steps completed by patient: Adjust clothing prior to toileting;Performs perineal hygiene;Adjust clothing after toileting Toileting Assistive Devices: Grab bar or rail for support Toileting: 4: Steadying assist  FIM - Radio producer Devices: Mining engineer Transfers: 5-To toilet/BSC: Supervision (verbal cues/safety issues);5-From toilet/BSC: Supervision (verbal cues/safety issues)  FIM - Control and instrumentation engineer Devices:  Walker;Bed rails;Arm rests;HOB elevated Bed/Chair Transfer: 4: Supine > Sit: Min A (steadying Pt. > 75%/lift 1 leg);3: Chair or W/C > Bed: Mod A (lift or lower assist)  FIM - Locomotion: Wheelchair Distance: 150 Locomotion: Wheelchair: 0: Activity did not occur FIM - Locomotion: Ambulation Locomotion: Ambulation Assistive Devices: Administrator Ambulation/Gait Assistance: Not tested (comment) Locomotion: Ambulation: 0: Activity did not occur  Comprehension Comprehension Mode: Auditory Comprehension: 5-Follows basic conversation/direction: With no assist  Expression Expression Mode: Verbal Expression: 5-Expresses basic needs/ideas: With no assist  Social Interaction Social Interaction: 6-Interacts appropriately with others with medication or extra time (anti-anxiety, antidepressant).  Problem Solving Problem Solving: 4-Solves basic 75 - 89% of the time/requires cueing 10 - 24% of the time  Memory Memory: 5-Recognizes or recalls 90% of the time/requires cueing < 10% of the time  Medical Problem List and Plan:  1. 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   -continue protective helmet  2. DVT Prophylaxis/Anticoagulation: SDH. Chronic Coumadin discontinued secondary to SDH  3. Pain Management: Tylenol as needed  4. History of alcohol tobacco abuse. Monitor for any signs of withdrawal. Provide counseling  5. Neuropsych: This patient is capable of making decisions on his own behalf.  6. Seizure prophylaxis. Keppra 500 mg twice a day. Monitor for any seizure activity  7. Atrial fibrillation. Chronic Coumadin discontinued. Continue Cardizem 60 mg 4 times a day, Coreg 25 mg twice a day  -encourage adequate fluid intake  8. Pseudomonas UTI. Levaquin initiated 05/02/2014 --continue  through today 9. History of CAD with CABG. Chest pain or shortness of breath. Continue to monitor  10. Hyperlipidemia. Lipitor  11. Hyperglycemia. 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 Surgical: large extracranial fluid collection/CSF on CT---will ask Dr. Saintclair Halsted to re-assess  LOS (Days) 8 A FACE TO FACE EVALUATION WAS PERFORMED  Antwan Pandya T 05/12/2014 8:25 AM

## 2014-05-12 NOTE — Progress Notes (Signed)
Social Work Patient ID: Alejandro [name], male   DOB: January 21, 1946, 68 y.o.   MRN: [id number]  Met with pt and wife this afternoon to review team conference.  Both aware target d/c date set for 6/12.  Pt admits he is very "disappointed' the he must stay through the week.  Pt with very depressed affect and wife states, "He's just ready to be home.  He's been gone for a month."  Did discuss pt's desire to d/c earlier (tomorrow) with PA and therapists.  PA states need to await neurosurgical consult re: CT report and monitor of lab work.  THerapists state that if pt is medically cleared for d/c, he has met their goals of supervision (no issues today) and could potentially d/c 6/11.   Will follow up with both pt and wife tomorrow.    Osiel Stick, LCSW

## 2014-05-12 NOTE — Progress Notes (Signed)
Occupational Therapy Session Note  Patient Details  Name: Alejandro [name] MRN: [id number] Date of Birth: September 14, 1946  Today's Date: 05/12/2014 Time: 0730-0830 and 1115-1200 Time Calculation: 60 min and 45 min   Short Term Goals: Week 1:  OT Short Term Goal 1 (Week 1): Focus on LTGs d/t short ELOS  Skilled Therapeutic Interventions/Progress Updates:   Session 1: Pt seen for ADL retraining with focus on sequencing, memory, problem solving, awareness, activity tolerance and functional transfers. Pt received supine in bed. Completed sit>supine with min cues to don helmet. Pt retrieved items for dressing with max cues for problem solving and memory. Ambulated to restroom via RW requiring min cues for attention. Initiated toileting requiring min assist/cues to adjust clothing prior to task. Transferred to shower chair with supervision and min cues however pt attempting to shower before doffing shirt secondary to decreased awareness. Pt bleeding around peri area and presenting with increased edema in the LE with right more affected than left. RN notified of concerns requesting donning of TED hose. Performed bathing at shower level requiring min assist sit>stand for safety and mod multimodal cues for sequencing and short term memory. Completed LB dressing at EOB requiring use of foot stool to reach feet and mod cues for problem solving. Pt fatiguing by end of session. Pt left sitting in w/c with helmet donned and all needs in reach.   Session 2: Pt seen for 1:1 skilled OT session with focus on sequencing, short term memory, safety awareness, activity tolerance and functional transfers. Pt received sitting in w/c with helmet donned. Provided demonstration and retraining of shower transfer using simulated walk in shower frame and shower chair. Pt practiced 3x using RW requiring supervision-min assist for balance and mod multimodal cues for sequencing and safety awareness secondary to decreased short term memory. On  second trial pt required +2 assist for sit>stand from couch secondary to decreased activity tolerance and low level of couch. With repetition pt able to complete with min cues and min assist on final trial however pt requiring frequent rest breaks. Pt returned to room and left in w/c with helmet donned and all needs in reach.     Therapy Documentation Precautions:  Precautions Precautions: Fall Precaution Comments: Helmet when OOB Restrictions Weight Bearing Restrictions: No General:   Vital Signs:   Pain: Pain Assessment Pain Assessment: No/denies pain Pain Score: 0-No pain   See FIM for current functional status  Therapy/Group: Individual Therapy  Yaacov Koziol Brooke 05/12/2014, 12:23 PM

## 2014-05-12 NOTE — Progress Notes (Signed)
Speech Language Pathology Daily Session Note  Patient Details  Name: Alejandro [name] MRN: [id number] Date of Birth: [birth year]  Today's Date: 05/12/2014 Time: 8938-1017 Time Calculation (min): 60 min  Short Term Goals: Week 1: SLP Short Term Goal 1 (Week 1): Pt will demonstrate selective attention in a mildly distracting enviornment for 45 minutes with supervision verbal cues.  SLP Short Term Goal 2 (Week 1): Pt will utilize external memory aids to recall new, daily information with supervision multimodal cues.  SLP Short Term Goal 3 (Week 1): Pt will identify 2 physical and 2 cognitive deficits with Min A multimodal cues.  SLP Short Term Goal 4 (Week 1): Pt will demonstrate functional problem solving for basic and familiar tasks with supervision multimodal cues.  SLP Short Term Goal 5 (Week 1): Pt will demonstrate efficient mastication of regular textures without overt s/s of aspiration with Mod I.  SLP Short Term Goal 6 (Week 1): Pt will consume current diet with Mod I to minimize overt s/s of aspiration.   Skilled Therapeutic Interventions:  Pt was seen for skilled speech therapy targeting ongoing assessment for diet advancement as well as executive function and recall.  Pt was observed with trials of regular textures and presented with slightly prolonged mastication of solids due to dentures not available at time of therapy; however pt exhibited adequate oral manipulation of solids resulting in complete clearance of residuals with extra time.  Pt reported that he did not wear his dentures at baseline due to poor fit and was able to tolerate regular solids.  As a result, recommend upgrading diet to regular textures with continued thin liquids.  Additionally, when engaged in structured recall tasks, pt was able to remember approximately 75% of his scheduled medications from yesterday's therapy session, improving to 100% accuracy with repetition of trials and mod assist.  With the use of a  compensatory aid for medication management which was established in yesterday's therapy session, pt was supervision for locating information related to targeted medications.  Pt also benefited from mod cuing for working memory and mental flexibility during a structured new learning task.    FIM:  Comprehension Comprehension Mode: Auditory Comprehension: 5-Follows basic conversation/direction: With extra time/assistive device Expression Expression Mode: Verbal Expression: 5-Expresses basic needs/ideas: With no assist Social Interaction Social Interaction: 6-Interacts appropriately with others with medication or extra time (anti-anxiety, antidepressant). Problem Solving Problem Solving: 4-Solves basic 75 - 89% of the time/requires cueing 10 - 24% of the time Memory Memory: 4-Recognizes or recalls 75 - 89% of the time/requires cueing 10 - 24% of the time FIM - Eating Eating Activity: 6: More than reasonable amount of time  Pain Pain Assessment Pain Assessment: No/denies pain  Therapy/Group: Individual Therapy  [name], M.A. CCC-SLP  [name], Selinda Orion 05/12/2014, 10:31 AM

## 2014-05-12 NOTE — Progress Notes (Signed)
Physical Therapy Session Note  Patient Details  Name: Alejandro [name] MRN: [id number] Date of Birth: [birth year]  Today's Date: 05/12/2014 Time: 9753-0051 and 1400-1446 (missed 14 min) Time Calculation (min): 42 min and 46 min  Short Term Goals: Week 1:  PT Short Term Goal 1 (Week 1): STGs=LTGs secondary to ELOS  Skilled Therapeutic Interventions/Progress Updates:    AM Session: Patient received sitting in wheelchair. Session focused on selective attention and task initiation/completion with functional mobility and bowling. Functional ambulation 160' x2 with RW (on carpet and tile) in controlled and community environments in busy hallway. Bowling game with emphasis on unsupported standing dynamic balance, repeated sit<>stands, selective attention, and scoring, patient requires min cues for attention. Patient returned to room and left seated in wheelchair with all needs within reach.  PM Session: Patient received sitting in wheelchair, wife present for family training/education session. Discussion about discharge planning and patient and wife agreeable for discharge whenever patient is deemed medically stable by rehab MD and neurosurgeon. Patient performed all aspects of functional mobility during session at supervision level, cues required for sequencing/technique: functional transfers, functional ambulation x160' with RW in controlled environment, stair negotiation x5 stairs with 2 handrails, and curb negotiation with RW. Wife educated about proper verbal cues to provide patient. Patient left sitting in wheelchair with all needs within reach and wife present.  Therapy Documentation Precautions:  Precautions Precautions: Fall Precaution Comments: Helmet when OOB Restrictions Weight Bearing Restrictions: No General: Amount of Missed PT Time (min): 14 Minutes Missed Time Reason: Patient fatigue Pain: Pain Assessment Pain Assessment: No/denies pain Pain Score: 0-No pain Locomotion  : Ambulation Ambulation/Gait Assistance: 5: Supervision   See FIM for current functional status  Therapy/Group: Individual Therapy  Lillia Abed. Staley Budzinski, PT, DPT 05/12/2014, 2:49 PM

## 2014-05-12 NOTE — Progress Notes (Signed)
Note reviewed and this therapist agrees with the information provided.

## 2014-05-13 ENCOUNTER — Encounter (HOSPITAL_COMMUNITY): Payer: BC Managed Care – PPO

## 2014-05-13 ENCOUNTER — Inpatient Hospital Stay (HOSPITAL_COMMUNITY): Payer: BC Managed Care – PPO | Admitting: *Deleted

## 2014-05-13 ENCOUNTER — Inpatient Hospital Stay (HOSPITAL_COMMUNITY): Payer: BC Managed Care – PPO

## 2014-05-13 ENCOUNTER — Inpatient Hospital Stay (HOSPITAL_COMMUNITY): Payer: BC Managed Care – PPO | Admitting: Speech Pathology

## 2014-05-13 LAB — CBC
HCT: 36.2 % — ABNORMAL LOW (ref 39.0–52.0)
HEMOGLOBIN: 13 g/dL (ref 13.0–17.0)
MCH: 31.5 pg (ref 26.0–34.0)
MCHC: 35.9 g/dL (ref 30.0–36.0)
MCV: 87.7 fL (ref 78.0–100.0)
Platelets: ADEQUATE 10*3/uL (ref 150–400)
RBC: 4.13 MIL/uL — ABNORMAL LOW (ref 4.22–5.81)
RDW: 14.4 % (ref 11.5–15.5)
WBC: 23.3 10*3/uL — ABNORMAL HIGH (ref 4.0–10.5)

## 2014-05-13 LAB — GLUCOSE, CAPILLARY
GLUCOSE-CAPILLARY: 218 mg/dL — AB (ref 70–99)
Glucose-Capillary: 122 mg/dL — ABNORMAL HIGH (ref 70–99)
Glucose-Capillary: 190 mg/dL — ABNORMAL HIGH (ref 70–99)
Glucose-Capillary: 174 mg/dL — ABNORMAL HIGH (ref 70–99)

## 2014-05-13 LAB — BASIC METABOLIC PANEL
BUN: 28 mg/dL — ABNORMAL HIGH (ref 6–23)
CHLORIDE: 100 meq/L (ref 96–112)
CO2: 24 meq/L (ref 19–32)
CREATININE: 0.72 mg/dL (ref 0.50–1.35)
Calcium: 8.1 mg/dL — ABNORMAL LOW (ref 8.4–10.5)
GFR calc Af Amer: >90 mL/min (ref >90)
GFR calc non Af Amer: >90 mL/min (ref >90)
Glucose, Bld: 135 mg/dL — ABNORMAL HIGH (ref 70–99)
Potassium: 4.6 mEq/L (ref 3.7–5.3)
Sodium: 135 mEq/L — ABNORMAL LOW (ref 137–147)

## 2014-05-13 LAB — URINE CULTURE

## 2014-05-13 MED ORDER — NITROFURANTOIN MONOHYD MACRO 100 MG PO CAPS
100.0000 mg | ORAL_CAPSULE | Freq: Two times a day (BID) | ORAL | Status: DC
  Administered 2014-05-13 – 2014-05-14 (×3): 100 mg via ORAL
  Filled 2014-05-13 (×5): qty 1

## 2014-05-13 NOTE — Progress Notes (Signed)
Physical Therapy Session Note  Patient Details  Name: Alejandro [name] MRN: [id number] Date of Birth: 07-27-1946  Today's Date: 05/13/2014 Time: 8309-4076 Time Calculation (min): 19 min  Short Term Goals: Week 1:  PT Short Term Goal 1 (Week 1): STGs=LTGs secondary to ELOS  Skilled Therapeutic Interventions/Progress Updates:    Patient received sitting in wheelchair, wife present. Patient declining therapy secondary to decreased motivation/depressed mood due to wanting to d/c home today. Patient able to be encouraged to participate in ambulation within the room and to/from bathroom so his wife could be checked off to assist him in doing so for remainder of stay. Patient agreeable to this and stating he does need to use the restroom. Patient ambulated with RW to/from bathroom with wife providing supervision and appropriate cues in regards to environment and assisting with set up as necessary (opening/closing door, turning on light, etc.). Patient's wife checked off to provide supervision for ambulation within room. Patient and wife verbalized understanding that patient is only to be up in room with wife providing supervision and that RW must be used and helmet donned. Patient left sitting in wheelchair with wife present.  Therapy Documentation Precautions:  Precautions Precautions: Fall Precaution Comments: Helmet when OOB Restrictions Weight Bearing Restrictions: No General: Amount of Missed PT Time (min): 41 Minutes Missed Time Reason: Patient unwilling/refused to participate without medical reason Pain: Pain Assessment Pain Assessment: No/denies pain Pain Score: 0-No pain Locomotion : Ambulation Ambulation/Gait Assistance: 5: Supervision   See FIM for current functional status  Therapy/Group: Individual Therapy  Lillia Abed. Llewyn Heap, PT, DPT 05/13/2014, 2:09 PM

## 2014-05-13 NOTE — Progress Notes (Signed)
Physical Therapy Discharge Summary  Patient Details  Name: Alejandro [name] MRN: [id number] Date of Birth: 1946/05/08  Today'Alejandro Date: 05/14/2014  Patient has met 12 of 12 long term goals due to improved activity tolerance, improved balance, improved postural control, increased strength, ability to compensate for deficits, improved attention, improved awareness and improved coordination.  Patient to discharge at an ambulatory level Supervision.   Patient'Alejandro wife, Alejandro [name],  is independent to provide the necessary cognitive assistance at discharge and has observed and participated in family education/training.   All discharge information below based on patient'Alejandro CLOFon 05/12/14; please see PT treatment note from 05/12/14 for further details on patient'Alejandro CLOF.  Reasons goals not met: N/A, all LTGs met.  Recommendation:  Patient will benefit from ongoing skilled PT services in home health setting to continue to advance safe functional mobility, address ongoing impairments in activity tolerance, balance, strength, attention, awareness, coordination, gait, overall mobility, and minimize fall risk.  Equipment: RW  Reasons for discharge: treatment goals met and discharge from hospital  Patient/family agrees with progress made and goals achieved: Yes  PT Discharge Precautions/Restrictions Precautions Precautions: Fall Precaution Comments: Helmet when OOB Restrictions Weight Bearing Restrictions: No Pain Pain Assessment Pain Assessment: No/denies pain Pain Score: 0-No pain Cognition Overall Cognitive Status: Impaired/Different from baseline Arousal/Alertness: Awake/alert Orientation Level: Oriented X4 Attention: Selective Sustained Attention: Impaired Sustained Attention Impairment: Functional basic;Verbal basic Selective Attention: Impaired Selective Attention Impairment: Functional basic;Verbal basic Memory: Impaired Memory Impairment: Retrieval deficit;Decreased recall of new  information;Decreased short term memory Decreased Short Term Memory: Verbal basic;Functional basic Awareness: Impaired Awareness Impairment: Intellectual impairment Problem Solving: Impaired Problem Solving Impairment: Functional basic Executive Function: Organizing;Sequencing Reasoning: Impaired Reasoning Impairment: Verbal basic Sequencing: Impaired Organizing: Impaired Organizing Impairment: Functional basic Decision Making Impairment: Verbal basic Safety/Judgment: Impaired [employer] Scales of Cognitive Functioning: Automatic/appropriate Sensation Sensation Light Touch: Appears Intact Proprioception: Appears Intact Coordination Gross Motor Movements are Fluid and Coordinated: Yes Fine Motor Movements are Fluid and Coordinated: Yes Motor  Motor Motor: Within Functional Limits  Mobility Bed Mobility Bed Mobility: Supine to Sit;Sit to Supine Supine to Sit: HOB flat;6: Modified independent (Device/Increase time) Sit to Supine: HOB flat;6: Modified independent (Device/Increase time) Transfers Transfers: Yes Sit to Stand: With upper extremity assist;With armrests;From bed;From chair/3-in-1;5: Supervision Sit to Stand Details: Verbal cues for precautions/safety Stand to Sit: With upper extremity assist;With armrests;To bed;To chair/3-in-1;5: Supervision Stand to Sit Details (indicate cue type and reason): Verbal cues for precautions/safety Stand Pivot Transfers: With armrests;5: Supervision Stand Pivot Transfer Details: Verbal cues for precautions/safety Locomotion  Ambulation Ambulation: Yes Ambulation/Gait Assistance: 5: Supervision Ambulation Distance (Feet): 200 Feet Assistive device: Rolling walker Ambulation/Gait Assistance Details: Verbal cues for precautions/safety;Verbal cues for safe use of DME/AE Gait Gait: Yes Gait Pattern: Impaired Gait Pattern: Step-through pattern;Decreased stride length;Trunk flexed;Wide base of support Stairs / Additional  Locomotion Stairs: Yes Stairs Assistance: 5: Supervision Stairs Assistance Details: Verbal cues for precautions/safety Stair Management Technique: Two rails;Forwards;Step to pattern Number of Stairs: 55 Height of Stairs: 6 Curb: 5: Supervision (with RW) Architect: Yes Wheelchair Assistance: 5: Investment banker, operational Details: Verbal cues for Information systems manager: Both upper extremities Wheelchair Parts Management: Needs assistance Distance: 150  Trunk/Postural Assessment  Cervical Assessment Cervical Assessment: Within Functional Limits Thoracic Assessment Thoracic Assessment: Within Functional Limits Lumbar Assessment Lumbar Assessment: Within Functional Limits Postural Control Postural Control: Deficits on evaluation Righting Reactions: delayed Protective Responses: delayed  Balance Balance Balance Assessed: Yes Static Sitting Balance Static Sitting - Balance  Support: Bilateral upper extremity supported;Feet supported Static Sitting - Level of Assistance: 6: Modified independent (Device/Increase time) Dynamic Sitting Balance Dynamic Sitting - Balance Support: Feet supported;No upper extremity supported Dynamic Sitting - Level of Assistance: 6: Modified independent (Device/Increase time) Dynamic Sitting - Balance Activities: Forward lean/weight shifting Static Standing Balance Static Standing - Balance Support: Bilateral upper extremity supported;During functional activity Static Standing - Level of Assistance: 5: Stand by assistance Extremity Assessment  RLE Assessment RLE Assessment: Within Functional Limits LLE Assessment LLE Assessment: Within Functional Limits  See FIM for current functional status  Alejandro [name] Alejandro [name] Alejandro. Alejandro [name], PT, DPT 05/14/2014, 7:47 AM

## 2014-05-13 NOTE — Discharge Summary (Signed)
Discharge summary job # [id number]

## 2014-05-13 NOTE — Progress Notes (Signed)
[employer] PHYSICAL MEDICINE & REHABILITATION     PROGRESS NOTE    Subjective/Complaints: No new problems. Slept well. Denies pain.  A  review of systems has been performed and if not noted above is otherwise negative.    Objective: Vital Signs: Blood pressure 160/88, pulse 76, temperature 98.7 F (37.1 C), temperature source Oral, resp. rate 18, weight 47.446 kg (104 lb 9.6 oz), SpO2 98.00%. 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  EXAM: CT HEAD WITHOUT CONTRAST  TECHNIQUE: Contiguous axial images were obtained from the base of the skull through the vertex without intravenous contrast.  COMPARISON:  04/28/2014.  FINDINGS: Post right temporal -frontal craniectomy for drainage of subdural hematoma. The material utilized to close the craniectomy site is dehiscent and there is a large adjacent subcutaneous cerebral spinal fluid collection. The gas contain within this postoperative fluid collection and small amount of pneumocephalus has cleared in the interim.  Right temporal lobe hemorrhage has cleared with large area of posttraumatic encephalomalacia.  Remote small right cerebellar infarcts.  White matter type changes otherwise may represent result of small vessel disease or shear type injuries without CT evidence of large acute infarct.  No intracranial hemorrhage detected.  No hydrocephalus.  Mastoid air cells, middle ear cavities and majority of paranasal sinuses are clear with very minimal mucosal thickening of ethmoid sinus air cells and inferior medial aspect right maxillary sinus.  Vascular calcifications.  IMPRESSION: Post right temporal -frontal craniectomy for drainage of subdural hematoma. The material utilized to close the craniectomy site is dehiscent and there is a large adjacent subcutaneous cerebral spinal fluid collection.  Right temporal lobe hemorrhage has cleared with large area of posttraumatic encephalomalacia noted.   Remote small right cerebellar infarcts.  White matter type changes may represent result of small vessel disease or shear type injuries without CT evidence of large acute infarct.  No intracranial hemorrhage detected.   Electronically Signed   By: Chauncey Cruel M.D.   On: 05/11/2014 18:11    Recent Labs  05/11/14 0605  WBC 21.3*  HGB 11.8*  HCT 33.8*  PLT 230    Recent Labs  05/12/14 0734 05/13/14 0554  NA 133* 135*  K 4.9 4.6  CL 96 100  GLUCOSE 138* 135*  BUN 28* 28*  CREATININE 0.80 0.72  CALCIUM 8.1* 8.1*   CBG (last 3)   Recent Labs  05/12/14 1627 05/12/14 2116 05/13/14 0717  GLUCAP 195* 198* 122*    Wt Readings from Last 3 Encounters:  05/05/14 47.446 kg (104 lb 9.6 oz)  05/04/14 104.282 kg (229 lb 14.4 oz)  05/04/14 104.282 kg (229 lb 14.4 oz)    Physical Exam:  Sitting in chair. No distress. obese  Eyes:  Pupils round and reactive to light  Mouth: edentulous, mucosa pink and moist  Head: craniectomy site remains swollen/soft. No drainage. Wound intact Neck: Normal range of motion. Neck supple. No thyromegaly present.  Cardiovascular:  Cardiac rate controlled without murmur  Respiratory: Effort normal and breath sounds normal. No respiratory distress.  GI: Soft. Bowel sounds are normal. He exhibits no distension.  Flap site dressed/intact. Minimally tender Neurological: He is alert.  Makes good eye contact with examiner. Far left upper quadrant of vision may be affected. No neglect. appropriate for name, place, age. Bilat mittens in place.Followed simple commands. Moves all 4 limbs with ease. Can follow simple commands. Improved insight and awareness. Recalls  biographical information. Non-impulsive.  Sensory exam intact. dtr's 1+.  Skin:  Intact other without drainage Psych: pleasant, cooperative, non-impulsive, non-anxious   Assessment/Plan: 1. Functional deficits secondary to right SDH s/p ultimate craniectomy  which require 3+ hours per day of  interdisciplinary therapy in a comprehensive inpatient rehab setting. Physiatrist is providing close team supervision and 24 hour management of active medical problems listed below. Physiatrist and rehab team continue to assess barriers to discharge/monitor patient progress toward functional and medical goals. FIM: FIM - Bathing Bathing Steps Patient Completed: Chest;Right Arm;Left Arm;Abdomen;Front perineal area;Buttocks;Right upper leg;Left upper leg;Right lower leg (including foot);Left lower leg (including foot) Bathing: 4: Steadying assist  FIM - Upper Body Dressing/Undressing Upper body dressing/undressing steps patient completed: Thread/unthread right sleeve of pullover shirt/dresss;Thread/unthread left sleeve of pullover shirt/dress;Put head through opening of pull over shirt/dress;Pull shirt over trunk Upper body dressing/undressing: 5: Set-up assist to: Obtain clothing/put away FIM - Lower Body Dressing/Undressing Lower body dressing/undressing steps patient completed: Thread/unthread right underwear leg;Thread/unthread left underwear leg;Pull underwear up/down;Thread/unthread right pants leg;Thread/unthread left pants leg;Pull pants up/down;Don/Doff left shoe;Fasten/unfasten right shoe;Fasten/unfasten left shoe Lower body dressing/undressing: 4: Min-Patient completed 75 plus % of tasks  FIM - Toileting Toileting steps completed by patient: Adjust clothing prior to toileting;Performs perineal hygiene Toileting Assistive Devices: Grab bar or rail for support Toileting: 3: Mod-Patient completed 2 of 3 steps  FIM - Radio producer Devices: Grab bars;Walker Toilet Transfers: 5-From toilet/BSC: Supervision (verbal cues/safety issues);5-To toilet/BSC: Supervision (verbal cues/safety issues)  FIM - Control and instrumentation engineer Devices: Walker;Arm rests Bed/Chair Transfer: 5: Bed > Chair or W/C: Supervision (verbal cues/safety issues);5:  Chair or W/C > Bed: Supervision (verbal cues/safety issues)  FIM - Locomotion: Wheelchair Distance: 150 Locomotion: Wheelchair: 5: Travels 150 ft or more: maneuvers on rugs and over door sills with supervision, cueing or coaxing FIM - Locomotion: Ambulation Locomotion: Ambulation Assistive Devices: Administrator Ambulation/Gait Assistance: 5: Supervision Locomotion: Ambulation: 5: Travels 150 ft or more with supervision/safety issues  Comprehension Comprehension Mode: Auditory Comprehension: 5-Follows basic conversation/direction: With no assist  Expression Expression Mode: Verbal Expression: 5-Expresses basic needs/ideas: With no assist  Social Interaction Social Interaction: 6-Interacts appropriately with others with medication or extra time (anti-anxiety, antidepressant).  Problem Solving Problem Solving: 4-Solves basic 75 - 89% of the time/requires cueing 10 - 24% of the time  Memory Memory: 5-Recognizes or recalls 90% of the time/requires cueing < 10% of the time  Medical Problem List and Plan:  1. 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   -continue protective helmet  2. DVT Prophylaxis/Anticoagulation: SDH. Chronic Coumadin discontinued secondary to SDH  3. Pain Management: Tylenol as needed  4. History of alcohol tobacco abuse. Monitor for any signs of withdrawal. Provide counseling  5. Neuropsych: This patient is capable of making decisions on his own behalf.  6. Seizure prophylaxis. Keppra 500 mg twice a day. Monitor for any seizure activity  7. Atrial fibrillation. Chronic Coumadin discontinued. Continue Cardizem 60 mg 4 times a day, Coreg 25 mg twice a day  -encourage adequate fluid intake  8. Pseudomonas UTI. --now with coag neg staph 80k in urine  -macrobid  for 5 days given presentation this week.  -recheck cbc today (increased wbc's despite tapering of steroids) 9. History of CAD with CABG. Chest pain or shortness of breath. Continue to monitor  10. Hyperlipidemia. Lipitor  11.  Hyperglycemia. 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 Surgical: large extracranial fluid collection/CSF on CT--appears stable  -have not heard from surgery yet.  LOS (Days) 9 A FACE TO FACE EVALUATION WAS PERFORMED  SWARTZ,ZACHARY T 05/13/2014 8:02 AM

## 2014-05-13 NOTE — Progress Notes (Signed)
Discharge note reviewed and this therapist agrees with the information provided.

## 2014-05-13 NOTE — Progress Notes (Signed)
Physical Therapy Note  Patient Details  Name: Alejandro [name] MRN: [id number] Date of Birth: Dec 19, 1945 Today's Date: 05/13/2014  Patient missed 60 minutes of skilled physical therapy this AM secondary to decreased motivation/depressed mood due to patient wanting to discharge today. Will follow up as able.   Tibes Adelma Bowdoin, PT, DPT 05/13/2014, 2:06 PM

## 2014-05-13 NOTE — Progress Notes (Signed)
Speech Language Pathology Daily Session Note  Patient Details  Name: Alejandro [name] MRN: [id number] Date of Birth: [birth year]  Today's Date: 05/13/2014 Time: 1445-1530 Time Calculation (min): 45 min  Short Term Goals: Week 1: SLP Short Term Goal 1 (Week 1): Pt will demonstrate selective attention in a mildly distracting enviornment for 45 minutes with supervision verbal cues.  SLP Short Term Goal 2 (Week 1): Pt will utilize external memory aids to recall new, daily information with supervision multimodal cues.  SLP Short Term Goal 3 (Week 1): Pt will identify 2 physical and 2 cognitive deficits with Min A multimodal cues.  SLP Short Term Goal 4 (Week 1): Pt will demonstrate functional problem solving for basic and familiar tasks with supervision multimodal cues.  SLP Short Term Goal 5 (Week 1): Pt will demonstrate efficient mastication of regular textures without overt s/s of aspiration with Mod I.  SLP Short Term Goal 6 (Week 1): Pt will consume current diet with Mod I to minimize overt s/s of aspiration.   Skilled Therapeutic Interventions:  Pt was seen for skilled speech therapy targeting working memory, attention, executive function and family education.  SLP reviewed rules of new learning activity from yesterday's therapy session as pt did not recall game.  Pt required mod-max assist for working memory during the aforementioned task as he frequently forgot targeted categories.  Additionally, pt benefited from min cuing for alternating attention as pt became easily tangential and required redirection to structured task.  Pt exhibited decreased thought organization and mental flexibility during generative naming tasks included in the structured game activity and required min-mod cuing to name at least one item of a given category per turn.  Pt's wife was present for the end of the session, as a result, SLP reviewed and reinforced discharge recommendations for home health speech follow up and  assistance for financial and medication management.  Pt and pt's wife were in agreement and all questions were answered to their satisfaction at this time.    FIM:  Comprehension Comprehension: 5-Follows basic conversation/direction: With extra time/assistive device Expression Expression: 5-Expresses basic needs/ideas: With extra time/assistive device Social Interaction Social Interaction: 5-Interacts appropriately 90% of the time - Needs monitoring or encouragement for participation or interaction. Problem Solving Problem Solving: 5-Solves basic 90% of the time/requires cueing < 10% of the time Memory Memory: 4-Recognizes or recalls 75 - 89% of the time/requires cueing 10 - 24% of the time  Pain Pain Assessment Pain Assessment: No/denies pain Pain Score: 0-No pain  Therapy/Group: Individual Therapy  [name], M.A. CCC-SLP   Yasmyn Bellisario, Selinda Orion 05/13/2014, 5:17 PM

## 2014-05-13 NOTE — Progress Notes (Signed)
Note reviewed and this therapist agrees with the information provided.

## 2014-05-13 NOTE — Progress Notes (Signed)
Occupational Therapy Session Note  Patient Details  Name: Alejandro [name] MRN: [id number] Date of Birth: 02/25/1946  Today's Date: 05/13/2014 Time:  1000-1100 Time Calculation: 60 min  Short Term Goals: Week 1:  OT Short Term Goal 1 (Week 1): Focus on LTGs d/t short ELOS  Skilled Therapeutic Interventions/Progress Updates:   Session 1: Pt seen for ADL retraining with focus on sequencing, memory, functional transfers, and family education. Pt received sitting in w/c with helmet donned sideways requiring min cues to correct. Pt oriented x3. Ambulated to shower using RW with supervision and min cues for short term memory. Transferred to shower chair with supervision requiring mod cues for awareness and sequencing shower steps as pt attempting to shower before doffing clothing. Completed bathing at shower level with mod cues for awareness and memory. Performed dressing at EOB with extra time and use of foot stool secondary to fatigue and difficulty reaching feet. Pt required mod cues for sequencing dressing steps and supervision for sit<>stand using RW. Educated pt's wife on supervision level, memory deficits, and frequent rest breaks for fatigue. Pt left sitting in w/c with helmet donned and wife present.   Session 2: Pt missing 45 minutes skilled OT secondary to decreased motivation and pt/family requesting to speak with PA in regards to later discharge. Notified PA who was going to go speak with pt and family.   Therapy Documentation Precautions:  Precautions Precautions: Fall Precaution Comments: Helmet when OOB Restrictions Weight Bearing Restrictions: No General:   Vital Signs: Therapy Vitals BP: 145/63 mmHg Pain: Pt reporting no pain at this time.   See FIM for current functional status  Therapy/Group: Individual Therapy  Gokul Waybright Brooke 05/13/2014, 1:21 PM

## 2014-05-13 NOTE — Progress Notes (Signed)
Occupational Therapy Discharge Summary  Patient Details  Name: Alejandro [name] MRN: [id number] Date of Birth: October 19, 1946  Today's Date: 05/13/2014  Patient has met 68 of 11 long term goals due to improved activity tolerance, improved balance, postural control, ability to compensate for deficits, functional use of  RIGHT upper, RIGHT lower, LEFT upper and LEFT lower extremity, improved attention, improved awareness and improved coordination.  Patient to discharge at overall Supervision level.  Patient's care partner is independent to provide the necessary physical and cognitive assistance at discharge. Spoke with wife in regards to supervision level and she felt comfortable with providing necessary care.    Reasons goals not met: N/A  Recommendation:  Patient will benefit from ongoing skilled OT services in home health setting to continue to advance functional skills in the area of BADL and overall cognitive remediation, safety awareness, activity tolerance, balance and ability to compensate for deficits.  Equipment: shower chair   Reasons for discharge: treatment goals met and discharge from hospital  Patient/family agrees with progress made and goals achieved: Yes  OT Discharge Precautions/Restrictions  Precautions Precautions: Fall Precaution Comments: Helmet when OOB Restrictions Weight Bearing Restrictions: No General Amount of Missed OT Time (min): 45 Minutes Missed Time Reason: Patient unwilling/refused to participate without medical reason Vital Signs Therapy Vitals BP: 145/63 mmHg Pain Pain Assessment Pain Assessment: No/denies pain Pain Score: 0-No pain ADL   Vision/Perception  Vision- History Baseline Vision/History: Wears glasses Wears Glasses: Reading only Patient Visual Report: No change from baseline Vision- Assessment Vision Assessment?: No apparent visual deficits Eye Alignment: Within Functional Limits Ocular Range of Motion: Within Functional  Limits Tracking/Visual Pursuits: Able to track stimulus in all quads without difficulty Saccades: Within functional limits Visual Fields: No apparent deficits  Cognition Overall Cognitive Status: Impaired/Different from baseline Arousal/Alertness: Awake/alert Orientation Level: Oriented X4 Attention: Selective;Alternating;Sustained Sustained Attention: Impaired Sustained Attention Impairment: Functional basic;Verbal basic Selective Attention: Impaired Selective Attention Impairment: Functional basic;Verbal basic Alternating Attention: Impaired Alternating Attention Impairment: Verbal basic;Functional basic Memory: Impaired Memory Impairment: Retrieval deficit;Decreased recall of new information;Decreased short term memory Decreased Short Term Memory: Verbal basic;Functional basic Awareness: Impaired Awareness Impairment: Intellectual impairment Problem Solving: Impaired Problem Solving Impairment: Functional basic;Verbal basic Executive Function: Organizing;Sequencing Reasoning: Impaired Reasoning Impairment: Verbal basic;Functional basic Sequencing: Impaired Sequencing Impairment: Functional basic Organizing: Impaired Organizing Impairment: Functional basic Decision Making: Impaired Decision Making Impairment: Verbal basic;Functional basic Safety/Judgment: Impaired [employer] Scales of Cognitive Functioning: Automatic/appropriate Sensation Sensation Light Touch: Appears Intact Proprioception: Appears Intact Coordination Gross Motor Movements are Fluid and Coordinated: Yes Fine Motor Movements are Fluid and Coordinated: Yes Motor  Motor Motor: Within Functional Limits Mobility  Bed Mobility Bed Mobility: Supine to Sit;Sit to Supine Supine to Sit: HOB flat;6: Modified independent (Device/Increase time) Sit to Supine: HOB flat;6: Modified independent (Device/Increase time) Transfers Sit to Stand: With upper extremity assist;With armrests;From bed;From  chair/3-in-1;5: Supervision Sit to Stand Details: Verbal cues for precautions/safety Stand to Sit: With upper extremity assist;With armrests;To bed;To chair/3-in-1;5: Supervision Stand to Sit Details (indicate cue type and reason): Verbal cues for precautions/safety  Trunk/Postural Assessment  Cervical Assessment Cervical Assessment: Within Functional Limits Thoracic Assessment Thoracic Assessment: Within Functional Limits Lumbar Assessment Lumbar Assessment: Within Functional Limits Postural Control Postural Control: Deficits on evaluation Righting Reactions: delayed Protective Responses: delayed  Balance Balance Balance Assessed: Yes Static Sitting Balance Static Sitting - Balance Support: Bilateral upper extremity supported;Feet supported Static Sitting - Level of Assistance: 6: Modified independent (Device/Increase time) Dynamic Sitting Balance Dynamic Sitting -  Balance Support: Feet supported;No upper extremity supported Dynamic Sitting - Level of Assistance: 6: Modified independent (Device/Increase time) Dynamic Sitting - Balance Activities: Forward lean/weight shifting Static Standing Balance Static Standing - Balance Support: Bilateral upper extremity supported;During functional activity Static Standing - Level of Assistance: 5: Stand by assistance Extremity/Trunk Assessment RUE Assessment RUE Assessment: Within Functional Limits LUE Assessment LUE Assessment: Within Functional Limits  See FIM for current functional status  Arnol Mcgibbon Brooke 05/13/2014, 2:44 PM

## 2014-05-14 DIAGNOSIS — S069X9A Unspecified intracranial injury with loss of consciousness of unspecified duration, initial encounter: Secondary | ICD-10-CM

## 2014-05-14 DIAGNOSIS — W19XXXA Unspecified fall, initial encounter: Secondary | ICD-10-CM

## 2014-05-14 DIAGNOSIS — I4891 Unspecified atrial fibrillation: Secondary | ICD-10-CM

## 2014-05-14 DIAGNOSIS — I2510 Atherosclerotic heart disease of native coronary artery without angina pectoris: Secondary | ICD-10-CM

## 2014-05-14 DIAGNOSIS — S069XAA Unspecified intracranial injury with loss of consciousness status unknown, initial encounter: Secondary | ICD-10-CM

## 2014-05-14 DIAGNOSIS — I5022 Chronic systolic (congestive) heart failure: Secondary | ICD-10-CM

## 2014-05-14 LAB — CBC WITH DIFFERENTIAL/PLATELET
BASOS ABS: 0 10*3/uL (ref 0.0–0.1)
BASOS PCT: 0 % (ref 0–1)
Band Neutrophils: 0 % (ref 0–10)
Blasts: 0 %
EOS ABS: 0 10*3/uL (ref 0.0–0.7)
EOS PCT: 0 % (ref 0–5)
HCT: 31.5 % — ABNORMAL LOW (ref 39.0–52.0)
HEMOGLOBIN: 11.1 g/dL — AB (ref 13.0–17.0)
LYMPHS ABS: 0.3 10*3/uL — AB (ref 0.7–4.0)
Lymphocytes Relative: 2 % — ABNORMAL LOW (ref 12–46)
MCH: 31.1 pg (ref 26.0–34.0)
MCHC: 35.2 g/dL (ref 30.0–36.0)
MCV: 88.2 fL (ref 78.0–100.0)
MONO ABS: 0 10*3/uL — AB (ref 0.1–1.0)
MYELOCYTES: 0 %
Metamyelocytes Relative: 0 %
Monocytes Relative: 0 % — ABNORMAL LOW (ref 3–12)
NRBC: 0 /100{WBCs}
Neutro Abs: 13.9 10*3/uL — ABNORMAL HIGH (ref 1.7–7.7)
Neutrophils Relative %: 98 % — ABNORMAL HIGH (ref 43–77)
Platelets: 143 10*3/uL — ABNORMAL LOW (ref 150–400)
Promyelocytes Absolute: 0 %
RBC: 3.57 MIL/uL — ABNORMAL LOW (ref 4.22–5.81)
RDW: 14.2 % (ref 11.5–15.5)
WBC: 14.2 10*3/uL — AB (ref 4.0–10.5)

## 2014-05-14 LAB — GLUCOSE, CAPILLARY: Glucose-Capillary: 109 mg/dL — ABNORMAL HIGH (ref 70–99)

## 2014-05-14 MED ORDER — FOLIC ACID 1 MG PO TABS: 1.0000 mg | ORAL_TABLET | Freq: Every day | ORAL | Status: DC

## 2014-05-14 MED ORDER — ADULT MULTIVITAMIN W/MINERALS CH: 1.0000 | ORAL_TABLET | Freq: Every day | ORAL | Status: AC

## 2014-05-14 MED ORDER — PANTOPRAZOLE SODIUM 40 MG PO TBEC: 40.0000 mg | DELAYED_RELEASE_TABLET | Freq: Every day | ORAL | Status: DC

## 2014-05-14 MED ORDER — NITROFURANTOIN MONOHYD MACRO 100 MG PO CAPS: 100.0000 mg | ORAL_CAPSULE | Freq: Two times a day (BID) | ORAL | Status: DC

## 2014-05-14 MED ORDER — SIMVASTATIN 40 MG PO TABS: 40.0000 mg | ORAL_TABLET | Freq: Every evening | ORAL | Status: DC

## 2014-05-14 MED ORDER — DEXAMETHASONE 2 MG PO TABS: ORAL_TABLET | ORAL | Status: DC

## 2014-05-14 MED ORDER — DILTIAZEM HCL 60 MG PO TABS: 60.0000 mg | ORAL_TABLET | Freq: Four times a day (QID) | ORAL | Status: DC

## 2014-05-14 MED ORDER — LEVETIRACETAM 500 MG PO TABS
500.0000 mg | ORAL_TABLET | Freq: Two times a day (BID) | ORAL | Status: DC
  Administered 2014-05-14: 500 mg via ORAL
  Filled 2014-05-14 (×3): qty 1

## 2014-05-14 MED ORDER — LEVETIRACETAM 500 MG PO TABS: 500.0000 mg | ORAL_TABLET | Freq: Two times a day (BID) | ORAL | Status: DC

## 2014-05-14 MED ORDER — CARVEDILOL 25 MG PO TABS: 25.0000 mg | ORAL_TABLET | Freq: Two times a day (BID) | ORAL | Status: DC

## 2014-05-14 MED ORDER — LEVETIRACETAM 100 MG/ML PO SOLN: 500.0000 mg | Freq: Two times a day (BID) | ORAL | Status: DC

## 2014-05-14 NOTE — Discharge Instructions (Signed)
Inpatient Rehab Discharge Instructions  [name] Discharge date and time: No discharge date for patient encounter.   Activities/Precautions/ Functional Status: Activity: activity as tolerated Diet: regular diet Wound Care: keep wound clean and dry Functional status:  ___ No restrictions     ___ Walk up steps independently __x_ 24/7 supervision/assistance   ___ Walk up steps with assistance ___ Intermittent supervision/assistance  ___ Bathe/dress independently ___ Walk with walker     ___ Bathe/dress with assistance ___ Walk Independently    ___ Shower independently _x__ Walk with assistance    ___ Shower with assistance ___ No alcohol     ___ Return to work/school ________   COMMUNITY REFERRALS UPON DISCHARGE:    Home Health:   PT     OT    ST    RN                     Agency: [facility] Phone: [phone]   Medical Equipment/Items Ordered: rolling walker, tub seat                                                    Agency/Supplier: [facility] @ [phone]   GENERAL COMMUNITY RESOURCES FOR PATIENT/FAMILY:  Support Groups: Brain Injury Support Groups    Caregiver Support: same   Mental Health: Neuropsychologist (seen on Rehab unit) = Dr. Marlane Hatcher @ [phone] - schedule visit as you feel necessary     Special Instructions: Safety helmet for safety   NO COUMADIN/NO BLOOD THINNING PRODUCTS   Follow up sodium levels with primary care provider   My questions have been answered and I understand these instructions. I will adhere to these goals and the provided educational materials after my discharge from the hospital.  Patient/Caregiver Signature _______________________________ Date __________  Clinician Signature _______________________________________ Date __________  Please bring this form and your medication list with you to all your follow-up doctor's appointments.

## 2014-05-14 NOTE — Discharge Summary (Signed)
Alejandro [name], Alejandro [name] NO.:  [id number]  MEDICAL RECORD NO.:  [id number]  LOCATION:  [id number]                        FACILITY:  [facility]  PHYSICIAN:  Lauraine Rinne, P.A.  DATE OF BIRTH:  Sep 21, 1946  DATE OF ADMISSION:  05/04/2014 DATE OF DISCHARGE:                              DISCHARGE SUMMARY   DISCHARGE DIAGNOSES: 1. Functional deficits secondary to right temporal contusion-subdural     hematoma with craniotomy on Apr 19, 2014, with re-exploration on     Apr 23, 2014, secondary to reaccumulation of hematoma with     craniectomy and implantation of bone flap right abdominal wall. 2. Support hose for deep venous thrombosis prophylaxis. 3. Pain management. 4. Seizure prophylaxis. 5. Atrial fibrillation-Coumadin discontinued secondary to subdural     hematoma. 6. Pseudomonas urinary tract infection. 7. History of coronary artery disease with coronary artery bypass     graft surgery. 8. Hyperlipidemia. 9. Hyperglycemia secondary to Decadron. 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  HISTORY OF PRESENT ILLNESS:  This is a 68 year old right-handed male with history of atrial fibrillation on chronic Coumadin, who was admitted on Apr 19, 2014, after a fall with laceration on his head. Cranial CT scan showed subdural hematoma with midline shift, hemorrhagic contusion on the right temporal lobe as well as right white matter disease.  INR on admission of 2.5 and reversed.  Underwent right craniotomy evacuation of subdural hematoma on 04/19/2014, per Dr. Saintclair Halsted. Maintained on [place] for seizure prophylaxis.  Initial postoperative CT scan stable.  Again, advised serial CT scans, which was repeated on Apr 23, 2014, showing expansion of right temporal intracerebral hematoma and possible venous infarction with temporal lobe swelling.  He underwent re- exploration of right craniotomy for partial temporal lobectomy and evacuation of right temporal  hematoma with implantation of bone flap in the right lateral abdominal wall cavity on Apr 23, 2014.  The patient remained intubated until Apr 28, 2014, and follow up for critical care services.  Noted wound with some clear drainage out of one area posterior aspect of incisions suspicious for CSF that has since resolved.  He was maintained on steroid protocol.  Wound culture showed Gram stain, no organisms seen.  Urine study 100,000 Pseudomonas, maintained on Levaquin.  Physical and occupational therapy on going. The patient was admitted for comprehensive rehab program.  PAST MEDICAL HISTORY:  See discharge diagnoses.  SOCIAL HISTORY:  Lives with spouse.  FUNCTIONAL HISTORY:  Prior to admission independent.  FUNCTIONAL STATUS:  Upon admission to rehab services was mod assist; ambulate 6 feet with a rolling walker, plus two person handheld assist for equipment use; min mod assist activities of daily living.  PHYSICAL EXAMINATION:  VITAL SIGNS:  Blood pressure 146/84, pulse 101, temperature 97.9, respirations 18. GENERAL:  This was an alert male, makes good eye contact with examiner. No neglect.  Appropriate for name, place, and age.  Followed simple commands.  Moves all extremities. HEENT:  Pupils were round and reactive to light. LUNGS:  Clear auscultation. CARDIAC:  Rate controlled. ABDOMEN:  Soft, nontender.  Good bowel sounds.  The flap site to surgical site clean, dressed, and intact.  REHABILITATION HOSPITAL COURSE:  The  patient was admitted to inpatient rehab services with therapies initiated on a 3-hour daily basis consisting of physical therapy, occupational therapy, speech therapy, and rehabilitation nursing.  The following issues were addressed during the patient's rehabilitation stay.  Pertaining to Mr. Mizzell functional deficits secondary to right temporal contusion and subdural hematoma, had undergone craniotomy on Apr 19, 2014, with re-exploration on 04/23/2014,  secondary to reaccumulation of hematoma with craniectomy implantation of bone flap to the right abdominal wall.  A protective helmet was ordered.  The patient with initial bouts of lethargy Decadron taper's protocol. CT of the head was completed 05/11/2014, that did show at the craniectomy site some dehiscent large adjacent subcutaneous cerebral spinal fluid collection that was addressed per Neurosurgery. Advised continue helmet for support and safety and follow with Neurosurgery in 1 week after discharge.  No other intracranial hemorrhage or acute changes noted.  The patient's chronic Coumadin for atrial fibrillation had been discontinued secondary to subdural hematoma.  Cardiac rate remained controlled.  He continued on Cardizem. He did have a history of tobacco abuse.  He received full counsel in regards to cessation of any nicotine products.  It was questionable if he would be compliant with these request.  He continued on Keppra for seizure prophylaxis.  HOSPITAL COURSE:  While on rehab, Pseudomonas UTI, and coag Staph negative, placed on Macrobid and close monitoring for any fever remaining afebrile.  He did have some mild elevation in white count and felt to be related to his Decadron.  Noted routine followup labs while on rehab services showed hyponatremia 129, placed on fluid restriction as well as sodium chloride tablets.  A random urine sodium showed of 59, osmolality of 279.  His sodium did rebound nicely to 135.  His sodium tablets were discontinued and monitored.  He would have to follow up with his PCP for ongoing chemistries.  The patient received weekly collaborative interdisciplinary team conferences to discuss estimated length of stay, family teaching, and any barriers to discharge.  The patient supervision level needing some cues required for sequencing technique, functional transfers, functional ambulation of 160 feet with a rolling walker in a controlled  environment.  Stair negotiation with hand rails with minimal cues.  Occupational therapy; the patient was able to gather his belongings but had limited carry over and again needed cues for his safety.  Follow up with speech therapy.  His diet was advanced to a regular consistency.  The patient was able to provide conversation for his necessary needs, 75-89% accurate for simple problem- solving and recognition.  Full family teaching was completed and again the need for ongoing supervision home for safety.  DISCHARGE MEDICATIONS: 1. Lipitor 20 mg p.o. daily. 2. Coreg 25 mg p.o. b.i.d. 3. Decadron taper as directed. 4. Cardizem 60 mg p.o. q.i.d. 5. Folic acid 1 mg p.o. daily. 6. Keppra 500 mg p.o. b.i.d. 7. Multivitamin 1 tab daily. 8. Macrobid 100 mg p.o. every 12 hours x5 days total. 9. Protonix 40 mg p.o. daily.  DIET:  Regular.  SPECIAL INSTRUCTIONS:  The patient would follow up Dr. Alger Simons at the outpatient rehab service office as directed.  Dr. Kary Kos, Neurosurgery in 1 week, call for appointment.  Dr. Larose Kells, Medical Management And Special Instructions.  Continue safety helmet for protection of wound.  Follow up chemistries with primary care provider to monitor sodium levels.     Lauraine Rinne, P.A.     DA/MEDQ  D:  05/13/2014  T:  05/14/2014  Job:  [id number]  cc:   Kary Kos, M.D. [name], MD

## 2014-05-14 NOTE — Progress Notes (Signed)
Social Work  Discharge Note  The overall goal for the admission was met for:   Discharge location: Yes  - home with wife providing 24/7 assistance  Length of Stay: Yes - 10 days  Discharge activity level: Yes - supervision  Home/community participation: Yes  Services provided included: MD, RD, PT, OT, SLP, RN, TR, Pharmacy, Neuropsych and SW  Financial Services: Private Insurance: [employer]  Follow-up services arranged: Home Health: RN, PT, OT, ST via [facility], DME: rolling walker, tub seat via [facility], Other: provided contact info for Dr. Marlane Hatcher as recommended for pt to follow up as needed and Patient/Family has no preference for HH/DME agencies  Comments (or additional information):  With pt/ wife agreement, submitted comp membership referral with [facility], provided handout info on local support groups and [facility] web support.  Patient/Family verbalized understanding of follow-up arrangements: Yes  Individual responsible for coordination of the follow-up plan: pt  Confirmed correct DME delivered: Genetta Fiero 05/14/2014    Naleyah Ohlinger

## 2014-05-14 NOTE — Progress Notes (Signed)
[facility] PHYSICAL MEDICINE & REHABILITATION     PROGRESS NOTE    Subjective/Complaints: Excited about d/c Sta[place] removed this am A  review of systems has been performed and if not noted above is otherwise negative.    Objective: Vital Signs: Blood pressure 144/68, pulse 77, temperature 97.4 F (36.3 C), temperature source Oral, resp. rate 18, weight 103.919 kg (229 lb 1.6 oz), SpO2 99.00%. No results found.  Recent Labs  05/13/14 0900 05/14/14 0500  WBC 23.3* 14.2*  HGB 13.0 11.1*  HCT 36.2* 31.5*  PLT PLATELET CLUMPS NOTED ON SMEAR, COUNT APPEARS ADEQUATE 143*    Recent Labs  05/12/14 0734 05/13/14 0554  NA 133* 135*  K 4.9 4.6  CL 96 100  GLUCOSE 138* 135*  BUN 28* 28*  CREATININE 0.80 0.72  CALCIUM 8.1* 8.1*   CBG (last 3)   Recent Labs  05/13/14 1638 05/13/14 2109 05/14/14 0752  GLUCAP 218* 174* 109*    Wt Readings from Last 3 Encounters:  05/13/14 103.919 kg (229 lb 1.6 oz)  05/04/14 104.282 kg (229 lb 14.4 oz)  05/04/14 104.282 kg (229 lb 14.4 oz)    Physical Exam:  Sitting in chair. No distress. obese  Eyes:  Pupils round and reactive to light  Mouth: edentulous, mucosa pink and moist  Head: craniectomy site remains swollen/soft. No drainage. Wound intact Neck: Normal range of motion. Neck supple. No thyromegaly present.  Cardiovascular:  Cardiac rate controlled without murmur  Respiratory: Effort normal and breath sounds normal. No respiratory distress.  GI: Soft. Bowel sounds are normal. He exhibits no distension.  Flap site dressed/intact. Minimally tender Neurological: He is alert.  Makes good eye contact with examiner. Far left upper quadrant of vision may be affected. No neglect. appropriate for name, place, age. Bilat mittens in place.Followed simple commands. Moves all 4 limbs with ease. Can follow simple commands. Improved insight and awareness. Recalls  biographical information. Non-impulsive. Sensory exam intact. dtr's 1+.   Skin:  Intact other without drainage, 1-70mm gap at inf asp of crani incision no drainge or erythema Psych: pleasant, cooperative, non-impulsive, non-anxious   Assessment/Plan: 1. Functional deficits secondary to right SDH s/p ultimate craniectomy  which require 3+ hours per day of interdisciplinary therapy in a comprehensive inpatient rehab setting. Stable for D/C today F/u PCP in 1-2 weeks NS Dr Saintclair Halsted 1 wk F/u PM&R 3-4 weeks See D/C summary See D/C instructions FIM: FIM - Bathing Bathing Steps Patient Completed: Chest;Right Arm;Left Arm;Abdomen;Front perineal area;Buttocks;Right upper leg;Left upper leg;Right lower leg (including foot);Left lower leg (including foot) Bathing: 5: Set-up assist to: Obtain items  FIM - Upper Body Dressing/Undressing Upper body dressing/undressing steps patient completed: Thread/unthread right sleeve of pullover shirt/dresss;Thread/unthread left sleeve of pullover shirt/dress;Put head through opening of pull over shirt/dress;Pull shirt over trunk Upper body dressing/undressing: 5: Set-up assist to: Obtain clothing/put away FIM - Lower Body Dressing/Undressing Lower body dressing/undressing steps patient completed: Thread/unthread right underwear leg;Thread/unthread left underwear leg;Pull underwear up/down;Thread/unthread right pants leg;Thread/unthread left pants leg;Pull pants up/down;Don/Doff left shoe;Fasten/unfasten right shoe;Fasten/unfasten left shoe;Don/Doff right sock;Don/Doff right shoe;Don/Doff left sock Lower body dressing/undressing: 5: Set-up assist to: Obtain clothing  FIM - Toileting Toileting steps completed by patient: Adjust clothing prior to toileting;Performs perineal hygiene Toileting Assistive Devices: Grab bar or rail for support Toileting: 3: Mod-Patient completed 2 of 3 steps  FIM - Radio producer Devices: Grab bars;Walker Toilet Transfers: 5-From toilet/BSC: Supervision (verbal cues/safety  issues);5-To toilet/BSC: Supervision (verbal cues/safety issues)  FIM -  Bed/Chair Financial planner Devices: Environmental consultant;Arm rests Bed/Chair Transfer: 5: Bed > Chair or W/C: Supervision (verbal cues/safety issues);5: Chair or W/C > Bed: Supervision (verbal cues/safety issues)  FIM - Locomotion: Wheelchair Distance: 150 Locomotion: Wheelchair: 0: Activity did not occur FIM - Locomotion: Ambulation Locomotion: Ambulation Assistive Devices: Administrator Ambulation/Gait Assistance: 5: Supervision Locomotion: Ambulation: 1: Travels less than 50 ft with supervision/safety issues  Comprehension Comprehension Mode: Auditory Comprehension: 5-Follows basic conversation/direction: With extra time/assistive device  Expression Expression Mode: Verbal Expression: 5-Expresses basic needs/ideas: With extra time/assistive device  Social Interaction Social Interaction: 5-Interacts appropriately 90% of the time - Needs monitoring or encouragement for participation or interaction.  Problem Solving Problem Solving: 5-Solves basic 90% of the time/requires cueing < 10% of the time  Memory Memory: 4-Recognizes or recalls 75 - 89% of the time/requires cueing 10 - 24% of the time  Medical Problem List and Plan:  1. 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   -continue protective helmet  2. DVT Prophylaxis/Anticoagulation: SDH. Chronic Coumadin discontinued secondary to SDH  3. Pain Management: Tylenol as needed  4. History of alcohol tobacco abuse. Monitor for any signs of withdrawal. Provide counseling  5. Neuropsych: This patient is capable of making decisions on his own behalf.  6. Seizure prophylaxis. Keppra 500 mg twice a day. Monitor for any seizure activity  7. Atrial  fibrillation. Chronic Coumadin discontinued. Continue Cardizem 60 mg 4 times a day, Coreg 25 mg twice a day  -encourage adequate fluid intake  8. Pseudomonas UTI. --now with coag neg staph 80k in urine  -macrobid for 5 days given presentation this week.  -recheck cbc today (increased wbc's despite tapering of steroids) 9. History of CAD with CABG. Chest pain or shortness of breath. Continue to monitor  10. Hyperlipidemia. Lipitor  11. Hyperglycemia. 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 Surgical: large extracranial fluid collection/CSF on CT--appears stable  -have not heard from surgery yet.  LOS (Days) 10 A FACE TO FACE EVALUATION WAS PERFORMED  KIRSTEINS,ANDREW E 05/14/2014 10:02 AM

## 2014-05-14 NOTE — Plan of Care (Signed)
Problem: RH Attention Goal: LTG Patient will demonstrate focused/sustained (SLP) LTG: Patient will demonstrate focused/sustained/selective/alternating/divided attention during cognitive/linguistic activities in specific environment with assist for # of minutes (SLP)  Outcome: Not Met (add Reason) Inconsistent progress  Problem: RH Awareness Goal: LTG: Patient will demonstrate intellectual/emergent (SLP) LTG: Patient will demonstrate intellectual/emergent/anticipatory awareness with assist during a cognitive/linguistic activity (SLP)  Outcome: Not Met (add Reason) Inconsistent progress

## 2014-05-14 NOTE — Progress Notes (Signed)
Speech Language Pathology Discharge Summary  Patient Details  Name: Alejandro [name] MRN: [id number] Date of Birth: [birth year]  Today's Date: 05/14/2014  Patient has met 4 of 6 long term goals.  Patient to discharge at overall Supervision;Min level.  Reasons goals not met: inconsistent progress   Clinical Impression/Discharge Summary: Patient has made functional gains and has met 4 of 6 long term goals this admission due to improved mastication of regular solids, use of external aids for memory, and problem solving for basic tasks. Patient is currently an overall supervision-min assist level for cognitive tasks due to decreased awareness/insight and selective attention impairments and is modified independent for utilization of swallowing compensatory strategies to minimize overt s/s of aspiration with regular solids, thin liquids. Patient and family education are complete and patient will discharge home with 24/7 supervision from family.  Patient would benefit from home health follow up SLP services to maximize cognitive function in order to maximize his functional independence.   Care Partner:  Caregiver Able to Provide Assistance: Yes  Type of Caregiver Assistance: Physical;Cognitive  Recommendation:  Outpatient SLP;24 hour supervision/assistance;Home Health SLP  Rationale for SLP Follow Up: Reduce caregiver burden;Maximize cognitive function and independence   Equipment: none recommended by SLP   Reasons for discharge: Discharged from hospital   Patient/Family Agrees with Progress Made and Goals Achieved: Yes   See FIM for current functional status  [name], M.A. CCC-SLP  Tenaya Hilyer, Selinda Orion 05/14/2014, 4:59 PM

## 2014-05-18 ENCOUNTER — Telehealth: Payer: Self-pay | Admitting: Cardiovascular Disease

## 2014-05-18 NOTE — Telephone Encounter (Signed)
I spoke with Christian [facility] from [place]) and she saw the pt today for a home visit.  The pt's lungs are clear, no cough, positive lower extremity edema (3+ pitting), face swelling and abdominal distention, no abdominal tenderness.  The pt is not weighing on a daily basis and is not on a low sodium diet.  Since leaving the hospital the pt is living with his daughter.  I made Darrick Meigs aware that the pt does have a diagnosis of CHF and was on Furosemide 40mg  daily.  This medication was not on the discharge summary. In reviewing the hospital chart I did not see any notes from Cardiology. Darrick Meigs has already advised the pt on weighing daily, low sodium diet and elevating legs.  She is scheduled to see the pt again on Friday and will contact our office with a patient update.  I will forward this message to Dr [name] to make him aware of the pt's hospitalization.

## 2014-05-18 NOTE — Telephone Encounter (Signed)
In reviewing the pt's chart I noticed that the pt developed hyponatremia and this

## 2014-05-18 NOTE — Telephone Encounter (Signed)
Please discard previous message. EPIC logged out due to error message. In reviewing the pt's chart the pt developed hyponatremia while in rehab and was given sodium tablets. I will discuss the pt's lasix dose with Dr [name] before restarting this medication.

## 2014-05-18 NOTE — Telephone Encounter (Signed)
Agree with starting him on lasix 40 mg daily (may need to be increased). Please bring in for PA/NP eval for CHF management. thx

## 2014-05-18 NOTE — Telephone Encounter (Signed)
New message     Pt has not been diagnosed with CHF--however when she was at him home today, he has 3+ peding edema but not sob and is not on a diruretic

## 2014-05-19 DIAGNOSIS — R51 Headache: Secondary | ICD-10-CM | POA: Diagnosis not present

## 2014-05-19 MED ORDER — FUROSEMIDE 40 MG PO TABS: 40.0000 mg | ORAL_TABLET | Freq: Every day | ORAL | Status: DC

## 2014-05-19 NOTE — Consult Note (Signed)
NEUROCOGNITIVE STATUS EXAMINATION - [place]   [name] is a 68 year old, right-handed man, who was seen for a brief neurocognitive status examination to evaluate his emotional state and mental status in the setting of TBI.  According to his medical record, he was admitted on 04/19/14 after a fall with laceration on his head.  Cranial CT demonstrated subdural hematoma with midline shift, hemorrhagic contusion on the right temporal lobe as well as white matter disease.  He underwent right craniotomy to evacuate subdural hematoma on 04/19/14.  Repeat CT on 04/23/14 revealed expansion of right temporal intracerebral hematoma and possible venous infarction with temporal lobe swelling.  He underwent re-exploration of right craniotomy for partial temporal lobectomy and evacuation of right temporal hematoma with implantation of bone flap in right lateral abdominal wall cavity on 04/23/14.  He remained intubated until 04/28/14.    Emotional Functioning:  During the clinical interview, Alejandro [name] reported feeling depressed because he cannot do the things that he wants to do and he feels "stuck" with his loss of independence.  However, he said that he copes by looking forward to the future and he stated that he has lots of support.  He is comfortable with his discharge plan and mentioned that he might move in with his granddaughter, which he said would be "okay."  In general, Alejandro [name] denied major symptoms of mood disruption and denied having major worries or concerns.  He commented that he is unsure if he will return to work, though he was considering retirement anyway, so he was not distressed by that possibility.  Alejandro [name] responses to self-report measures of mood symptoms were not suggestive of the presence of clinically significant depression or anxiety at this time.    Mental Status:  Alejandro [name] total score on an overall measure of mental status was suggestive of  marked cognitive impairment, at the level of dementia (MMSE-2 = brief = 11/16).  He lost points for failing to recall 2 of 3 previously studied words and for misstating the season of the year, date, and floor of the building that he was on.  Subjectively, he stated that he has noticed changes in his memory causing him to have difficulty working on a computer, as well as slowed processing speed.    Impressions and Recommendations:  Alejandro [name] overall neurocognitive profile was suggestive of the presence of significant cognitive impairment, at the level of dementia.  However, most of his points were lost for minor disorientation errors, which can happen during hospitalization when routine is broken.  He should experience some cognitive recovery over time, though it remains uncertain whether or not he will return to his cognitive baseline.  He was encouraged to seek a formal outpatient neuropsychological evaluation should his cognitive difficulties prevent him from engaging in typical activities post-discharge or should his cognitive symptoms not improve over time.  From an emotional standpoint, he certainly seems to be experiencing depressive symptoms, though they are expected and consistent with adjustment to his current situation and he seems to be coping well.  There is no evidence to suggest the presence of a pervasive underlying psychiatric condition.  Should Alejandro [name] mood significantly worsen prior to his discharge, a follow-up session could be arranged.     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  Clinical Neuropsychologist

## 2014-05-19 NOTE — Telephone Encounter (Signed)
Follow up      Talk to lauren--urgently

## 2014-05-19 NOTE — Telephone Encounter (Signed)
I spoke with Alejandro [name] and she is not scheduled to see the pt today but did receive a call from the pt's wife that the pt has had a 2 lb weight loss since yesterday, headache, and legs are weeping fluid. I contacted the pt's wife and made her aware that we will restart Furosemide 40mg  daily.  I instructed her to have the pt take 2 tablets (80mg ) immediately when she picked up the Rx today and then go back to normal dose tomorrow. I spoke with her about elevating the pt's legs, no salt in diet and weighing daily. The pt does not have SOB. Alejandro [name] is going to see the pt tomorrow for next home visit per pt's wife. I will arrange appointment for the pt to be seen in our office for follow-up.

## 2014-05-20 NOTE — Telephone Encounter (Signed)
I spoke with the pt and Christian did not do a home visit today.  The pt said his swelling is a little bit better today. I advised the pt that I have scheduled him to come into the office tomorrow with Richardson Dopp PA-C for evaluation. The pt agreed with plan.

## 2014-05-21 ENCOUNTER — Ambulatory Visit (INDEPENDENT_AMBULATORY_CARE_PROVIDER_SITE_OTHER): Payer: BC Managed Care – PPO | Admitting: Physician Assistant

## 2014-05-21 ENCOUNTER — Telehealth: Payer: Self-pay | Admitting: *Deleted

## 2014-05-21 ENCOUNTER — Encounter: Payer: Self-pay | Admitting: Physician Assistant

## 2014-05-21 VITALS — BP 101/60 | HR 63 | Ht 68.0 in | Wt 230.0 lb

## 2014-05-21 DIAGNOSIS — I2510 Atherosclerotic heart disease of native coronary artery without angina pectoris: Secondary | ICD-10-CM

## 2014-05-21 DIAGNOSIS — I6200 Nontraumatic subdural hemorrhage, unspecified: Secondary | ICD-10-CM

## 2014-05-21 DIAGNOSIS — I4891 Unspecified atrial fibrillation: Secondary | ICD-10-CM

## 2014-05-21 DIAGNOSIS — I429 Cardiomyopathy, unspecified: Secondary | ICD-10-CM

## 2014-05-21 DIAGNOSIS — S065XAA Traumatic subdural hemorrhage with loss of consciousness status unknown, initial encounter: Secondary | ICD-10-CM

## 2014-05-21 DIAGNOSIS — I428 Other cardiomyopathies: Secondary | ICD-10-CM

## 2014-05-21 DIAGNOSIS — E785 Hyperlipidemia, unspecified: Secondary | ICD-10-CM

## 2014-05-21 DIAGNOSIS — I482 Chronic atrial fibrillation, unspecified: Secondary | ICD-10-CM

## 2014-05-21 DIAGNOSIS — E876 Hypokalemia: Secondary | ICD-10-CM

## 2014-05-21 DIAGNOSIS — I5023 Acute on chronic systolic (congestive) heart failure: Secondary | ICD-10-CM

## 2014-05-21 DIAGNOSIS — S065X9A Traumatic subdural hemorrhage with loss of consciousness of unspecified duration, initial encounter: Secondary | ICD-10-CM

## 2014-05-21 LAB — BASIC METABOLIC PANEL
BUN: 29 mg/dL — AB (ref 6–23)
CALCIUM: 8.1 mg/dL — AB (ref 8.4–10.5)
CHLORIDE: 96 meq/L (ref 96–112)
CO2: 26 meq/L (ref 19–32)
Creatinine, Ser: 0.9 mg/dL (ref 0.4–1.5)
GFR: 88.96 mL/min (ref >60.00)
GLUCOSE: 99 mg/dL (ref 70–99)
Potassium: 3.4 mEq/L — ABNORMAL LOW (ref 3.5–5.1)
Sodium: 131 mEq/L — ABNORMAL LOW (ref 135–145)

## 2014-05-21 MED ORDER — POTASSIUM CHLORIDE ER 10 MEQ PO TBCR: 10.0000 meq | EXTENDED_RELEASE_TABLET | ORAL | Status: DC

## 2014-05-21 NOTE — Patient Instructions (Signed)
LAB WORK TODAY; BMET  STOP DILTIAZEM  CONTINUE CURRENT DOSE OF LASIX  HAVE THE HOME HEALTH NURSE GET LAB WORK IN 1 WEEK (BMET) WITH THE RESULTS FAXED TO [facility], [place]  PLEASE FOLLOW UP WITH [facility]ER, PAC OR LORI GERHARDT OR DR. [name] IN 2 WEEKS WITH WHOEVER HAS 1ST AVAILABILITY IN 2 WEEKS

## 2014-05-21 NOTE — Progress Notes (Signed)
Cardiology Office Note    Date:  05/21/2014   ID:  Alejandro [name], DOB [birth year], MRN [id number]  PCP:  [name], MD  Cardiologist:  Dr. [name]      History of Present Illness: Alejandro [name] is a 68 y.o. male with a history of CAD, status post CABG in 12/2008 (LIMA-LAD, SVG-diagonal, SVG-OM, SVG-PDA) ischemic/nonischemic cardiomyopathy, systolic CHF, HTN, HL, permanent atrial fibrillation.  EF has been severely depressed in the past. MRI in 09/2013 demonstrated improved LV function with an EF of 52%. Last seen by Dr. [name] 03/2014.    He was admitted 5/18-6/2 after falling and hitting his head resulting in a subdural hematoma. He required craniotomy and hematoma evacuation. He ultimately underwent reexploration of right craniotomy for partial temporal lobectomy and evacuation of right temporal hematoma with craniectomy. He was taken off anticoagulation. He was not felt to be a candidate for future anticoagulation. He was discharged to inpatient rehabilitation.  He ended up going to the ED at [facility] 2 days ago with leg swelling and a headache.  Head CT did not show anything acute.  CXR was neg for edema.  BNP was 141.  He was asked to resume Lasix.  He took 80 mg initially and is now on Lasix 40 mg QD.  He is somewhat more short of breath.  Mainly feels weak from recent events.  He is NYHA 3.  Denies chest pain.  No syncope.  No orthopnea, PND.  He has a non-productive cough.  Notes a raspy voice.     Studies:  - LHC (12/2008):  Left main 95%, LAD 90%, circumflex marginal 70%, EF 25-30%.  - Echo (02/24/13):  Mild LVH, EF 30-35%, mild LAE, mild RVE, mildly reduced RVSF  - Cardiac MRI (09/08/13):  EF 52%, no hyper-enhancement or infarct in LV myocardium, mild to moderate LAE, mild MR    Recent Labs: 09/08/2013: HDL Cholesterol by NMR 35.60*; LDL (calc) 52; TSH 0.76  05/05/2014: ALT 65*  05/13/2014: Creatinine 0.72; Potassium 4.6  05/14/2014: Hemoglobin 11.1*   Wt Readings from Last 3  Encounters:  05/21/14 230 lb (104.327 kg)  05/13/14 229 lb 1.6 oz (103.919 kg)  05/04/14 229 lb 14.4 oz (104.282 kg)     Past Medical History  Diagnosis Date  . Systolic heart failure   . 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 Dyslipidemia   . HTN (hypertension)   . Atrial fibrillation   . Cancer     around nose  . Tobacco abuse   . Alcohol abuse   . H/O hiatal hernia     Current Outpatient Prescriptions  Medication Sig Dispense Refill  . carvedilol (COREG) 25 MG tablet Take 1 tablet (25 mg total) by mouth 2 (two) times daily with a meal.  60 tablet  3  . diltiazem (CARDIZEM) 60 MG tablet Take 1 tablet (60 mg total) by mouth every 6 (six) hours.  517 tablet  1  . folic acid (FOLVITE) 1 MG tablet Take 1 tablet (1 mg total) by mouth daily.  30 tablet  1  . furosemide (LASIX) 40 MG tablet Take 1 tablet (40 mg total) by mouth daily.  90 tablet  1  . levETIRAcetam (KEPPRA) 500 MG tablet Take 1 tablet (500 mg total) by mouth 2 (two) times daily.  60 tablet  1  . Multiple Vitamin (MULTIVITAMIN WITH MINERALS) TABS tablet Take 1 tablet by mouth daily.      Marland Kitchen  nitrofurantoin, macrocrystal-monohydrate, (MACROBID) 100 MG capsule Take 1 capsule (100 mg total) by mouth every 12 (twelve) hours.  8 capsule  0  . pantoprazole (PROTONIX) 40 MG tablet Take 1 tablet (40 mg total) by mouth at bedtime.  30 tablet  1  . simvastatin (ZOCOR) 40 MG tablet Take 1 tablet (40 mg total) by mouth every evening.  30 tablet  1   No current facility-administered medications for this visit.    Allergies:   Review of patient's allergies indicates no known allergies.   Social History:  The patient  reports that he quit smoking about 13 months ago. His smoking use included Cigarettes. He has a 50 pack-year smoking history. He has never used smokeless tobacco. He reports that he drinks about 6 ounces of alcohol per week. He reports that he does not use illicit drugs.   Family History:  The  patient's family history includes Diabetes in his father and mother; Heart disease in his brother, father, and mother. There is no history of Colon cancer or Prostate cancer.   ROS:  Please see the history of present illness.      All other systems reviewed and negative.   PHYSICAL EXAM: VS:  BP 101/60  Pulse 63  Ht 5\' 8"  (1.727 m)  Wt 230 lb (104.327 kg)  BMI 34.98 kg/m2 Well nourished, well developed, in no acute distress HEENT: helmet in place Neck: no JVDat 90 Cardiac:  normal S1, S2; irregularly irregular rhythm; no murmur Lungs:  Decreased breath sounds bilaterally, no wheezing, rhonchi or rales Abd: soft, nontender, no hepatomegaly Ext: 1-2+ bilateral LE edema Skin: warm and dry Neuro:  CNs 2-12 intact, no focal abnormalities noted  EKG:  atrial fibrillation, HR 63, inferolateral ST abnormality     ASSESSMENT AND PLAN:  1. Acute on chronic systolic heart failure:  He is volume overloaded. Recent chest x-ray demonstrated no edema. BNP was minimally elevated at [facility] Continue current dose of Lasix for now. Check a basic metabolic panel today. Repeat basic metabolic panel in one week. Home health nursing will continue to monitor him next week and we will see him back here in 2 weeks. 2. Coronary Artery Disease:  No angina. He is not on any anticoagulation given recent subdural hematoma. Consider resuming aspirin in the future if cleared by neurosurgery .  Continue statin. 3. Mixed Ischemic and Non-Ischemic Cardiomyopathy:  Continue beta blocker. Eventually resume ACE inhibitor. Stop diltiazem. 4. Chronic atrial fibrillation:  Rate controlled. He was previously controlled on carvedilol alone.  Give hx of cardiomyopathy, I will stop diltiazem.  He is no longer candidate for anticoagulation. 5. Subdural hematoma:  Follow up with neurosurgery as noted. 6. HYPERLIPIDEMIA-MIXED:  Continue statin. 7. Disposition: Follow up with me, Dr. [name] or Truitt Merle, NP in the next 2  weeks.   Signed, Versie Starks, MHS 05/21/2014 11:21 AM    [facility] [place], [place], [place]  [address] Phone: [phone]; Fax: [phone]

## 2014-05-21 NOTE — Telephone Encounter (Signed)
lab taken to DOD (Dr. Meda Coffee) K+ 3.4. Pt and wife both advised per Dr. Meda Coffee to start K+ 10 meq daily x 5 days, bmet 05/28/14. Wife verbalized understanding to Plan of Care.

## 2014-05-24 ENCOUNTER — Ambulatory Visit (INDEPENDENT_AMBULATORY_CARE_PROVIDER_SITE_OTHER): Payer: BC Managed Care – PPO | Admitting: Internal Medicine

## 2014-05-24 ENCOUNTER — Encounter: Payer: Self-pay | Admitting: Internal Medicine

## 2014-05-24 VITALS — BP 107/65 | HR 78 | Temp 97.9°F

## 2014-05-24 DIAGNOSIS — S065X9A Traumatic subdural hemorrhage with loss of consciousness of unspecified duration, initial encounter: Secondary | ICD-10-CM

## 2014-05-24 DIAGNOSIS — R609 Edema, unspecified: Secondary | ICD-10-CM

## 2014-05-24 DIAGNOSIS — S065XAA Traumatic subdural hemorrhage with loss of consciousness status unknown, initial encounter: Secondary | ICD-10-CM

## 2014-05-24 DIAGNOSIS — I6200 Nontraumatic subdural hemorrhage, unspecified: Secondary | ICD-10-CM

## 2014-05-24 DIAGNOSIS — F1010 Alcohol abuse, uncomplicated: Secondary | ICD-10-CM

## 2014-05-24 DIAGNOSIS — Z23 Encounter for immunization: Secondary | ICD-10-CM

## 2014-05-24 DIAGNOSIS — I4891 Unspecified atrial fibrillation: Secondary | ICD-10-CM

## 2014-05-24 NOTE — Assessment & Plan Note (Signed)
Subdural hematoma, seems to be recovering well Emotionally may need help in the future, for now he is doing okay. Encouraged to call me if help is needed

## 2014-05-24 NOTE — Assessment & Plan Note (Signed)
Edema, Most likely dependent, we discussed leg elevation, low-salt diet, he just started diuretics, has not improved just yet but  I think the combination of Diuretics and leg elevation will work. Also recommend ACE wrapping the legs. We'll get a BMP next week and a followup in one month.

## 2014-05-24 NOTE — Assessment & Plan Note (Signed)
Atrial fibrillation, no longer a candidate for Coumadin d/t recent intracranial bleeding, rate well controlled at the present time.

## 2014-05-24 NOTE — Patient Instructions (Signed)
Continue taking the same medications  Elevate  legs at least 2 hours in the morning than 2 hours in the afternoon Try to avoid excessive salt Ace wrap the legs during daytime   Schedule labs 1 week from today BMP, CBC--- DX edema  Next visit 1 month

## 2014-05-24 NOTE — Progress Notes (Signed)
Pre visit review using our clinic review tool, if applicable. No additional management support is needed unless otherwise documented below in the visit note. 

## 2014-05-24 NOTE — Assessment & Plan Note (Signed)
Diagnosed with a UTI while in the hospital, status post antibiotics, now asymptomatic

## 2014-05-24 NOTE — Progress Notes (Signed)
Subjective:    Patient ID: Alejandro [name], male    DOB: 08/16/1946, 68 y.o.   MRN: [id number]  DOS:  05/24/2014 Type of  Visit:  Hospital followup History:HospitalRecords reviewed, excerpts:  Admit date: 04/19/2014  Discharge date: 05/04/2014  68 year old male with history of a-fib on coumadin presented to the ED after a fall. In the ED a head CT was done that revealed SDH with midline shift, hemorrhagic contusion on the right temporal lobe and white matter disease. Neurosurgery was consulted. Hospital Course:  Subdural hematoma/ICH  -traumatic, while on coumadin  -Underwent Craniotomy, hematoma evacuation on 5/18. On 5/22 repeat CT head showed evolution of intraparenchymal hemorrhagic contusion or possibly venous infarction in the right temporal lobe with significant right temporal lobe swelling. Taken back to the OR on 5/22 for Reexploration of right pterional craniotomy for partial temporal lobectomy and evacuation of right temporal hematoma with implantation of the bone flap in the right lateral abdominal wall cavity.  Taper decadron as written by NS Dr. Jori Moll given t patient  -PT recommends CIR. Has been evaluated and pending insurance approval.  -not a candidate for anticoagulation any longer.  Acute Ventilator Dependent Resp Failure  -in setting ICH with worsening mental status and repeat OR for evolving hemorrhage  -extubated 5/27-  Atx/collapse L hemithorax  -resolved post bscopy 5/23  -BAL 5/23 negative  Afib  -stable  -c/w continue Cardizem [trasntioned to 180 Cd on day of d/c--may need uptitration]  and Coreg  -not a candidate for anticoagulation anymore  HTN  -stable on Cardizem and Coreg  Alcoholic Delirium  -resolved  Pseudomonal UTI  -on Levaquin-since 5/29 , complete on 6/4  Chronic Systolic CHF  -last Echo-EF 02/24/13-30-35 percent  -compensated  -hold diuretics for now   Recommendations for Outpatient Follow-up:  1. No coumadin ever as had life threatening  bleed-Risks outweight benfit 2. Get cmet, cbc in 1 week 3. Follow with Dr. Saintclair Halsted as OP 4. consider increased dose cardizem from 180-->240 mg if suboptimal rate control 5. Consider addition diuretics as OP    ROS Patient is here with his wife, after the hospital stay he went to rehabilitation and released home 05/14/2014. Since then he is doing okay, still feeling weak. Has developed lower extremity edema after he was discharged from rehabilitation., Lasix and  K+ were started 3 days ago. On  further questioning, he is sitting most of the time. Denies any fever, chills. No chest pain or difficulty breathing, uses 3 pillows when he sleeps. No nausea, vomiting, abdominal pain. Emotionally he has ups and downs, occasionally very emotional but no suicidal ideas. Had a UTI, finished antibiotics, denies dysuria or gross hematuria. He has some dizziness when he moves.   Past Medical History  Diagnosis Date  . Systolic heart failure   . 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 Dyslipidemia   . HTN (hypertension)   . Atrial fibrillation   . Cancer     around nose  . Tobacco abuse   . Alcohol abuse   . H/O hiatal hernia     Past Surgical History  Procedure Laterality Date  . Coronary artery bypass graft  12/24/2008    4 vessel  . Umbilical hernia repair  2010  . Hernia repair  2012  . Coronary artery bypass graft  2012  . Ventral hernia repair N/A 01/22/2013    Procedure: HERNIA REPAIR VENTRAL ADULT;  Surgeon: Luella Cook III,  MD;  Location: [facility];  Service: General;  Laterality: N/A;  . Application of a-cell of chest/abdomen N/A 01/22/2013    Procedure: APPLICATION OF A-CELL OF CHEST/ABDOMEN;  Surgeon: [name], MD;  Location: [facility];  Service: General;  Laterality: N/A;  . Cystoscopy N/A 01/22/2013    Procedure: Consuela Mimes;  Surgeon: [name], MD;  Location: [facility];  Service: Urology;  Laterality: N/A;  Cystoscopy with balloon dilation. Insertion of  coude catheter.  . Craniotomy Right 04/19/2014    Procedure: CRANIOTOMY HEMATOMA EVACUATION SUBDURAL;  Surgeon: [name], MD;  Location: [place] [facility];  Service: Neurosurgery;  Laterality: Right;  right  . Craniotomy Right 04/23/2014    Procedure: Craniotomy for Intracerebral Hemorrhage;  Surgeon: [name], MD;  Location: [place] [facility];  Service: Neurosurgery;  Laterality: Right;    History   Social History  . Marital Status: Married    Spouse Name: N/A    Number of Children: 2  . Years of Education: N/A   Occupational History  . [employer]   .     Social History Main Topics  . Smoking status: Former Smoker -- 1.00 packs/day for 50 years    Types: Cigarettes    Quit date: 04/19/2013  . Smokeless tobacco: Never Used  . Alcohol Use: 6.0 oz/week    10 Cans of beer per week     Comment: 4-5  12oz beers daily  . Drug Use: No  . Sexual Activity: Not on file   Other Topics Concern  . Not on file   Social History Narrative  . No narrative on file        Medication List       This list is accurate as of: 05/24/14 11:40 AM.  Always use your most recent med list.               carvedilol 25 MG tablet  Commonly known as:  COREG  Take 1 tablet (25 mg total) by mouth 2 (two) times daily with a meal.     folic acid 1 MG tablet  Commonly known as:  FOLVITE  Take 1 tablet (1 mg total) by mouth daily.     furosemide 40 MG tablet  Commonly known as:  LASIX  Take 1 tablet (40 mg total) by mouth daily.     levETIRAcetam 500 MG tablet  Commonly known as:  KEPPRA  Take 1 tablet (500 mg total) by mouth 2 (two) times daily.     multivitamin with minerals Tabs tablet  Take 1 tablet by mouth daily.     nitrofurantoin (macrocrystal-monohydrate) 100 MG capsule  Commonly known as:  MACROBID  Take 1 capsule (100 mg total) by mouth every 12 (twelve) hours.     pantoprazole 40 MG tablet  Commonly known as:  PROTONIX  Take 1 tablet (40 mg total) by mouth at  bedtime.     potassium chloride 10 MEQ tablet  Commonly known as:  K-DUR  Take 1 tablet (10 mEq total) by mouth as directed. Take 1 tablet daily for 5 days then stop     simvastatin 40 MG tablet  Commonly known as:  ZOCOR  Take 1 tablet (40 mg total) by mouth every evening.           Objective:   Physical Exam BP 107/65  Pulse 78  Temp(Src) 97.9 F (36.6 C)  SpO2 97%  General -- alert, well-developed, NAD.Sitting in a wheelchair with  a helmet on  HEENT-- Not pale.  Lungs -- normal respiratory effort, no intercostal retractions, no accessory muscle use, and normal breath sounds.  Heart--irreg Extremities-- soft, symmetric, pitting feet-pretibial edema   Neurologic--  alert & oriented X3. Speech normal  Psych-- Cognition and judgment appear intact. Cooperative with normal attention span and concentration. No anxious or depressed appearing.      Assessment & Plan:

## 2014-05-24 NOTE — Assessment & Plan Note (Signed)
History of tobacco or alcohol abuse, Used to smoke and drink daily, abstinent since the recent admission.

## 2014-05-28 ENCOUNTER — Other Ambulatory Visit: Payer: BC Managed Care – PPO

## 2014-05-28 DIAGNOSIS — I5023 Acute on chronic systolic (congestive) heart failure: Secondary | ICD-10-CM | POA: Diagnosis not present

## 2014-05-28 DIAGNOSIS — I2510 Atherosclerotic heart disease of native coronary artery without angina pectoris: Secondary | ICD-10-CM | POA: Diagnosis not present

## 2014-05-28 DIAGNOSIS — I2589 Other forms of chronic ischemic heart disease: Secondary | ICD-10-CM | POA: Diagnosis not present

## 2014-05-28 NOTE — OR Nursing (Signed)
Addendum to scope page 

## 2014-05-31 ENCOUNTER — Other Ambulatory Visit (INDEPENDENT_AMBULATORY_CARE_PROVIDER_SITE_OTHER): Payer: BC Managed Care – PPO

## 2014-05-31 ENCOUNTER — Telehealth: Payer: Self-pay | Admitting: *Deleted

## 2014-05-31 DIAGNOSIS — E876 Hypokalemia: Secondary | ICD-10-CM

## 2014-05-31 DIAGNOSIS — R609 Edema, unspecified: Secondary | ICD-10-CM

## 2014-05-31 NOTE — Telephone Encounter (Signed)
rec lexapro 10 mg 1 po qhs #30, 1 RF Needs to keep the followup for next month

## 2014-05-31 NOTE — Telephone Encounter (Signed)
Caller name:  Pamala Hurry Relation to pt:  wife Call back number: [phone]  Pharmacy:  [facility] in [place]  Reason for call:   Pt wife brought pt in for labs this morning.  At check in, she stated at appt last week it was discussed about pt might need prescription for depression.  Pt wife states she does not feel he needs something for depression, but may need something to help with anxiety.  Please advise.  He is up all night, fidgety and very anxious.

## 2014-06-01 ENCOUNTER — Telehealth: Payer: Self-pay | Admitting: Internal Medicine

## 2014-06-01 LAB — BASIC METABOLIC PANEL
BUN: 12 mg/dL (ref 6–23)
CO2: 25 mEq/L (ref 19–32)
Calcium: 8.8 mg/dL (ref 8.4–10.5)
Chloride: 103 mEq/L (ref 96–112)
Creatinine, Ser: 1 mg/dL (ref 0.4–1.5)
GFR: 80.62 mL/min (ref >60.00)
GLUCOSE: 143 mg/dL — AB (ref 70–99)
POTASSIUM: 3.3 meq/L — AB (ref 3.5–5.1)
Sodium: 137 mEq/L (ref 135–145)

## 2014-06-01 LAB — CBC WITH DIFFERENTIAL/PLATELET
BASOS ABS: 0 10*3/uL (ref 0.0–0.1)
Basophils Relative: 0.1 % (ref 0.0–3.0)
EOS PCT: 0.9 % (ref 0.0–5.0)
Eosinophils Absolute: 0 10*3/uL (ref 0.0–0.7)
HEMATOCRIT: 30 % — AB (ref 39.0–52.0)
Hemoglobin: 10.1 g/dL — ABNORMAL LOW (ref 13.0–17.0)
LYMPHS ABS: 0.8 10*3/uL (ref 0.7–4.0)
Lymphocytes Relative: 24.7 % (ref 12.0–46.0)
MCHC: 33.7 g/dL (ref 30.0–36.0)
MCV: 89.9 fl (ref 78.0–100.0)
Monocytes Absolute: 0.2 10*3/uL (ref 0.1–1.0)
Monocytes Relative: 5.9 % (ref 3.0–12.0)
NEUTROS PCT: 68.4 % (ref 43.0–77.0)
Neutro Abs: 2.3 10*3/uL (ref 1.4–7.7)
Platelets: 358 10*3/uL (ref 150.0–400.0)
RBC: 3.34 Mil/uL — ABNORMAL LOW (ref 4.22–5.81)
RDW: 16.1 % — AB (ref 11.5–15.5)
WBC: 3.4 10*3/uL — ABNORMAL LOW (ref 4.0–10.5)

## 2014-06-01 MED ORDER — ESCITALOPRAM OXALATE 10 MG PO TABS: 10.0000 mg | ORAL_TABLET | Freq: Every day | ORAL | Status: DC

## 2014-06-01 MED ORDER — POTASSIUM CHLORIDE ER 10 MEQ PO TBCR: EXTENDED_RELEASE_TABLET | ORAL | Status: DC

## 2014-06-01 NOTE — Telephone Encounter (Signed)
Done pt notified.  

## 2014-06-01 NOTE — Telephone Encounter (Signed)
Done. rx sent . Pt scheduled

## 2014-06-01 NOTE — Telephone Encounter (Signed)
BMP collected by [facility] 05-28-14: 05/28/1914: cret .8, K 3.4 Advise pt: Continue KCL 10 meq 1 po qd, call #[phone], 3 RF Cont lasix

## 2014-06-06 DIAGNOSIS — I5020 Unspecified systolic (congestive) heart failure: Secondary | ICD-10-CM

## 2014-06-06 DIAGNOSIS — Z4889 Encounter for other specified surgical aftercare: Secondary | ICD-10-CM | POA: Diagnosis not present

## 2014-06-06 DIAGNOSIS — F17200 Nicotine dependence, unspecified, uncomplicated: Secondary | ICD-10-CM

## 2014-06-06 DIAGNOSIS — I4891 Unspecified atrial fibrillation: Secondary | ICD-10-CM

## 2014-06-06 DIAGNOSIS — N390 Urinary tract infection, site not specified: Secondary | ICD-10-CM | POA: Diagnosis not present

## 2014-06-06 DIAGNOSIS — Z9181 History of falling: Secondary | ICD-10-CM | POA: Diagnosis not present

## 2014-06-06 DIAGNOSIS — I2510 Atherosclerotic heart disease of native coronary artery without angina pectoris: Secondary | ICD-10-CM

## 2014-06-06 DIAGNOSIS — F1020 Alcohol dependence, uncomplicated: Secondary | ICD-10-CM

## 2014-06-06 DIAGNOSIS — I10 Essential (primary) hypertension: Secondary | ICD-10-CM

## 2014-06-06 DIAGNOSIS — B965 Pseudomonas (aeruginosa) (mallei) (pseudomallei) as the cause of diseases classified elsewhere: Secondary | ICD-10-CM | POA: Diagnosis not present

## 2014-06-06 DIAGNOSIS — I509 Heart failure, unspecified: Secondary | ICD-10-CM

## 2014-06-08 ENCOUNTER — Ambulatory Visit (INDEPENDENT_AMBULATORY_CARE_PROVIDER_SITE_OTHER): Payer: BC Managed Care – PPO | Admitting: Physician Assistant

## 2014-06-08 ENCOUNTER — Ambulatory Visit: Payer: Self-pay | Admitting: Cardiology

## 2014-06-08 ENCOUNTER — Encounter: Payer: Self-pay | Admitting: Physician Assistant

## 2014-06-08 VITALS — BP 122/70 | HR 83 | Ht 68.0 in | Wt 226.0 lb

## 2014-06-08 DIAGNOSIS — E876 Hypokalemia: Secondary | ICD-10-CM

## 2014-06-08 DIAGNOSIS — I5022 Chronic systolic (congestive) heart failure: Secondary | ICD-10-CM

## 2014-06-08 DIAGNOSIS — I2510 Atherosclerotic heart disease of native coronary artery without angina pectoris: Secondary | ICD-10-CM

## 2014-06-08 DIAGNOSIS — I482 Chronic atrial fibrillation, unspecified: Secondary | ICD-10-CM

## 2014-06-08 DIAGNOSIS — S065X9A Traumatic subdural hemorrhage with loss of consciousness of unspecified duration, initial encounter: Secondary | ICD-10-CM

## 2014-06-08 DIAGNOSIS — E785 Hyperlipidemia, unspecified: Secondary | ICD-10-CM

## 2014-06-08 DIAGNOSIS — I6200 Nontraumatic subdural hemorrhage, unspecified: Secondary | ICD-10-CM

## 2014-06-08 DIAGNOSIS — Z5181 Encounter for therapeutic drug level monitoring: Secondary | ICD-10-CM

## 2014-06-08 DIAGNOSIS — I429 Cardiomyopathy, unspecified: Secondary | ICD-10-CM

## 2014-06-08 DIAGNOSIS — S065XAA Traumatic subdural hemorrhage with loss of consciousness status unknown, initial encounter: Secondary | ICD-10-CM

## 2014-06-08 DIAGNOSIS — I4891 Unspecified atrial fibrillation: Secondary | ICD-10-CM

## 2014-06-08 DIAGNOSIS — I428 Other cardiomyopathies: Secondary | ICD-10-CM

## 2014-06-08 LAB — BASIC METABOLIC PANEL
BUN: 11 mg/dL (ref 6–23)
CO2: 26 meq/L (ref 19–32)
Calcium: 9.3 mg/dL (ref 8.4–10.5)
Chloride: 100 mEq/L (ref 96–112)
Creatinine, Ser: 0.9 mg/dL (ref 0.4–1.5)
GFR: 84.59 mL/min (ref >60.00)
Glucose, Bld: 91 mg/dL (ref 70–99)
POTASSIUM: 3.9 meq/L (ref 3.5–5.1)
SODIUM: 136 meq/L (ref 135–145)

## 2014-06-08 MED ORDER — LISINOPRIL 2.5 MG PO TABS: 2.5000 mg | ORAL_TABLET | Freq: Every day | ORAL | Status: DC

## 2014-06-08 NOTE — Progress Notes (Signed)
Cardiology Office Note    Date:  06/08/2014   ID:  Alejandro [name], DOB 20-Aug-1946, MRN [id number]  PCP:  [name], MD  Cardiologist:  Dr. [name]      History of Present Illness: Alejandro [name] is a 68 y.o. male with a history of CAD, status post CABG in 12/2008 (LIMA-LAD, SVG-diagonal, SVG-OM, SVG-PDA) ischemic/nonischemic cardiomyopathy, systolic CHF, HTN, HL, permanent atrial fibrillation.  EF has been severely depressed in the past. MRI in 09/2013 demonstrated improved LV function with an EF of 52%.   Admitted 04/2014 after falling and hitting his head resulting in a subdural hematoma which required craniotomy and hematoma evacuation. He ultimately underwent reexploration of right craniotomy for partial temporal lobectomy and evacuation of right temporal hematoma with craniectomy. He was taken off anticoagulation and is not felt to be a candidate for future anticoagulation.   I saw him a couple weeks ago with volume excess.  He had been to [facility] emergency room prior to this visit.  He was placed back on Lasix.  He returns for follow up.  His weight is down.  His LE edema is somewhat improved. He denies significant dyspnea.  He is working with PT.  Denies chest pain.  He sleeps on 1-2 pillows.  No PND.  No syncope.     Studies:  - LHC (12/2008):  Left main 95%, LAD 90%, circumflex marginal 70%, EF 25-30%. => CABG  - Echo (02/24/13):  Mild LVH, EF 30-35%, mild LAE, mild RVE, mildly reduced RVSF  - Cardiac MRI (09/08/13):  EF 52%, no hyper-enhancement or infarct in LV myocardium, mild to moderate LAE, mild MR    Recent Labs: 09/08/2013: HDL Cholesterol by NMR 35.60*; LDL (calc) 52; TSH 0.76  05/05/2014: ALT 65*  06/01/2014: Creatinine 1.0; Hemoglobin 10.1*; Potassium 3.3*   Wt Readings from Last 3 Encounters:  06/08/14 226 lb (102.513 kg)  05/21/14 230 lb (104.327 kg)  05/13/14 229 lb 1.6 oz (103.919 kg)     Past Medical History  Diagnosis Date  . Systolic heart failure   . CAD  (coronary artery disease)     s/p CABGx4 on 12/24/2008  . Dyslipidemia   . HTN (hypertension)   . Atrial fibrillation   . Cancer     around nose  . Tobacco abuse   . Alcohol abuse   . H/O hiatal hernia     Current Outpatient Prescriptions  Medication Sig Dispense Refill  . carvedilol (COREG) 25 MG tablet Take 1 tablet (25 mg total) by mouth 2 (two) times daily with a meal.  60 tablet  3  . doxycycline (VIBRA-TABS) 100 MG tablet antibiotic      . escitalopram (LEXAPRO) 10 MG tablet Take 1 tablet (10 mg total) by mouth at bedtime.  30 tablet  1  . folic acid (FOLVITE) 1 MG tablet Take 1 tablet (1 mg total) by mouth daily.  30 tablet  1  . furosemide (LASIX) 40 MG tablet Take 1 tablet (40 mg total) by mouth daily.  90 tablet  1  . levETIRAcetam (KEPPRA) 500 MG tablet Take 1 tablet (500 mg total) by mouth 2 (two) times daily.  60 tablet  1  . Multiple Vitamin (MULTIVITAMIN WITH MINERALS) TABS tablet Take 1 tablet by mouth daily.      . pantoprazole (PROTONIX) 40 MG tablet Take 1 tablet (40 mg total) by mouth at bedtime.  30 tablet  1  . potassium chloride (K-DUR) 10 MEQ tablet Take 1  tablet daily  30 tablet  3  . simvastatin (ZOCOR) 40 MG tablet Take 1 tablet (40 mg total) by mouth every evening.  30 tablet  1   No current facility-administered medications for this visit.    Allergies:   Review of patient's allergies indicates no known allergies.   Social History:  The patient  reports that he quit smoking about 13 months ago. His smoking use included Cigarettes. He has a 50 pack-year smoking history. He has never used smokeless tobacco. He reports that he drinks about 6 ounces of alcohol per week. He reports that he does not use illicit drugs.   Family History:  The patient's family history includes Diabetes in his father and mother; Heart disease in his brother, father, and mother. There is no history of Colon cancer or Prostate cancer.   ROS:  Please see the history of present  illness.   His appetite is somewhat poor.    All other systems reviewed and negative.   PHYSICAL EXAM: VS:  BP 122/70  Pulse 83  Ht 5\' 8"  (1.727 m)  Wt 226 lb (102.513 kg)  BMI 34.37 kg/m2 Well nourished, well developed, in no acute distress HEENT: helmet in place Neck: no JVDat 90 Cardiac:  normal S1, S2; irregularly irregular rhythm; no murmur Lungs:  Decreased breath sounds bilaterally, no wheezing, rhonchi or rales Abd: soft, nontender, no hepatomegaly Ext: 1+ bilateral LE edema Skin: warm and dry Neuro:  CNs 2-12 intact, no focal abnormalities noted  EKG:  atrial fibrillation, HR 83, inferolateral ST abnormality, no change from prior tracing.     ASSESSMENT AND PLAN:  1. Chronic systolic heart failure:  Volume is improved.  LE edema seems to be improving slowly.  Recent labs with low K+.  Repeat BMET today. Continue current dose of Lasix.  Add ACEI back to regimen.  Repeat BMET in 1 week.  2. Coronary Artery Disease:  No angina. He is not on any anticoagulation given recent subdural hematoma. Consider resuming aspirin in the future if cleared by neurosurgery .  Continue statin. 3. Mixed Ischemic and Non-Ischemic Cardiomyopathy:  Continue beta blocker. Start Lisinopril 2.5 mg QD. Check BMET in 1 week/  4. Chronic atrial fibrillation:  Rate controlled. He is no longer candidate for anticoagulation. 5. Subdural hematoma:  Follow up with neurosurgery as planned. 6. HYPERLIPIDEMIA-MIXED:  Continue statin. 7. Disposition: Follow up with Dr. [name] in 3 mos.   Signed, Versie Starks, MHS 06/08/2014 1:11 PM    [facility] [address], [place], [place]  [address] Phone: [phone]; Fax: [phone]

## 2014-06-08 NOTE — Patient Instructions (Signed)
Your physician has recommended you make the following change in your medication:   1. Start Lisinopril 2.5 mg daily.   Your physician recommends that you return for lab work today for bmet.  Your physician recommends that you return for lab work in: 1 week for bmet on 06/15/14.   Your physician wants you to follow-up in: 3 months with Dr. [name].  You will receive a reminder letter in the mail two months in advance. If you don't receive a letter, please call our office to schedule the follow-up appointment.

## 2014-06-09 ENCOUNTER — Encounter: Payer: Self-pay | Admitting: Internal Medicine

## 2014-06-09 ENCOUNTER — Ambulatory Visit (INDEPENDENT_AMBULATORY_CARE_PROVIDER_SITE_OTHER): Payer: BC Managed Care – PPO | Admitting: Internal Medicine

## 2014-06-09 VITALS — BP 103/64 | HR 90 | Temp 97.8°F | Wt 224.0 lb

## 2014-06-09 DIAGNOSIS — R609 Edema, unspecified: Secondary | ICD-10-CM

## 2014-06-09 DIAGNOSIS — S065X9A Traumatic subdural hemorrhage with loss of consciousness of unspecified duration, initial encounter: Secondary | ICD-10-CM

## 2014-06-09 DIAGNOSIS — S065XAA Traumatic subdural hemorrhage with loss of consciousness status unknown, initial encounter: Secondary | ICD-10-CM

## 2014-06-09 DIAGNOSIS — F411 Generalized anxiety disorder: Secondary | ICD-10-CM

## 2014-06-09 MED ORDER — FLUOXETINE HCL 20 MG PO TABS: 20.0000 mg | ORAL_TABLET | Freq: Every day | ORAL | Status: DC

## 2014-06-09 NOTE — Assessment & Plan Note (Signed)
Since the last time he was here, they call and requested something for anxiety, taking Lexapro for few days, it has helped a great deal but he has been quite sleepy. Will switch from Lexapro to Prozac which is less sedative. If that doesn't work we will look into other options like Effexor.

## 2014-06-09 NOTE — Assessment & Plan Note (Signed)
To see neurosurgery tomorrow

## 2014-06-09 NOTE — Patient Instructions (Signed)
Start fluoxetine 20 mg ----> half tablet daily for one week, then one tablet daily If it cause feeling sleepy please call us   Take an extra Lasix tablet at 2 PM  on Tuesdays and Fridays for the next  2 weeks  Elevate the legs  If swelling not better in 2-3 weeks, please let me know  Next visit 6 weeks

## 2014-06-09 NOTE — Progress Notes (Signed)
Pre visit review using our clinic review tool, if applicable. No additional management support is needed unless otherwise documented below in the visit note. 

## 2014-06-09 NOTE — Assessment & Plan Note (Signed)
Gradually improving, I again encouraged leg elevation, will take an extra Lasix twice a week, see instructions. Recent potassium normal.

## 2014-06-09 NOTE — Progress Notes (Signed)
Subjective:    Patient ID: Alejandro [name], male    DOB: 1946/09/24, 68 y.o.   MRN: [id number]  DOS:  06/09/2014 Type of visit - description: follow up History: Since the last time he was here, he started Lexapro for anxiety, symptoms are much improved however since he started the medication he is very sleepy, wife reports he could sleep 3/4 of the daytime. Edema is still there but gradually improving   ROS Denies headaches, nausea or vomiting. No depression or suicidality Sleeping okay. I did notice some chest congestion, admits to occasional cough.  Past Medical History  Diagnosis Date  . Systolic heart failure   . 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 Dyslipidemia   . HTN (hypertension)   . Atrial fibrillation   . Cancer     around nose  . Tobacco abuse   . Alcohol abuse   . H/O hiatal hernia     Past Surgical History  Procedure Laterality Date  . Coronary artery bypass graft  12/24/2008    4 vessel  . Umbilical hernia repair  2010  . Hernia repair  2012  . Coronary artery bypass graft  2012  . Ventral hernia repair N/A 01/22/2013    Procedure: HERNIA REPAIR VENTRAL ADULT;  Surgeon: [name], MD;  Location: [place];  Service: General;  Laterality: N/A;  . Application of a-cell of chest/abdomen N/A 01/22/2013    Procedure: APPLICATION OF A-CELL OF CHEST/ABDOMEN;  Surgeon: [name], MD;  Location: [place];  Service: General;  Laterality: N/A;  . Cystoscopy N/A 01/22/2013    Procedure: Consuela Mimes;  Surgeon: [name], MD;  Location: [place];  Service: Urology;  Laterality: N/A;  Cystoscopy with balloon dilation. Insertion of coude catheter.  . Craniotomy Right 04/19/2014    Procedure: CRANIOTOMY HEMATOMA EVACUATION SUBDURAL;  Surgeon: [name], MD;  Location: [place] [facility];  Service: Neurosurgery;  Laterality: Right;  right  . Craniotomy Right 04/23/2014    Procedure: Craniotomy for Intracerebral Hemorrhage;  Surgeon: [name], MD;   Location: [employer] [facility];  Service: Neurosurgery;  Laterality: Right;    History   Social History  . Marital Status: Married    Spouse Name: N/A    Number of Children: 2  . Years of Education: N/A   Occupational History  . [employer]   .     Social History Main Topics  . Smoking status: Former Smoker -- 1.00 packs/day for 50 years    Types: Cigarettes    Quit date: 04/19/2013  . Smokeless tobacco: Never Used  . Alcohol Use: 6.0 oz/week    10 Cans of beer per week     Comment: 4-5  12oz beers daily  . Drug Use: No  . Sexual Activity: Not on file   Other Topics Concern  . Not on file   Social History Narrative  . No narrative on file        Medication List       This list is accurate as of: 06/09/14  6:13 PM.  Always use your most recent med list.               carvedilol 25 MG tablet  Commonly known as:  COREG  Take 1 tablet (25 mg total) by mouth 2 (two) times daily with a meal.     FLUoxetine 20 MG tablet  Commonly known as:  PROZAC  Take 1 tablet (20 mg total) by mouth daily.     folic acid 1 MG tablet  Commonly known as:  FOLVITE  Take 1 tablet (1 mg total) by mouth daily.     furosemide 40 MG tablet  Commonly known as:  LASIX  Take 1 tablet (40 mg total) by mouth daily.     levETIRAcetam 500 MG tablet  Commonly known as:  KEPPRA  Take 1 tablet (500 mg total) by mouth 2 (two) times daily.     lisinopril 2.5 MG tablet  Commonly known as:  ZESTRIL  Take 1 tablet (2.5 mg total) by mouth daily.     multivitamin with minerals Tabs tablet  Take 1 tablet by mouth daily.     pantoprazole 40 MG tablet  Commonly known as:  PROTONIX  Take 1 tablet (40 mg total) by mouth at bedtime.     potassium chloride 10 MEQ tablet  Commonly known as:  K-DUR  Take 1 tablet daily     simvastatin 40 MG tablet  Commonly known as:  ZOCOR  Take 1 tablet (40 mg total) by mouth every evening.           Objective:   Physical Exam BP 103/64   Pulse 90  Temp(Src) 97.8 F (36.6 C)  Wt 224 lb (101.606 kg)  SpO2 97% General -- alert, well-developed, NAD.   Lungs -- normal respiratory effort, no intercostal retractions, no accessory muscle use, and few end exp wheezes, no rhonchi, crackles     Extremities-- ++ edema bilaterally , slt R>L, better compared to last OV Neurologic--  Alert  & oriented X3. (does look tired)  Psych-- Cooperative with normal attention span and concentration. No anxious or depressed appearing.        Assessment & Plan:

## 2014-06-10 ENCOUNTER — Encounter: Payer: Self-pay | Admitting: *Deleted

## 2014-06-15 ENCOUNTER — Other Ambulatory Visit (INDEPENDENT_AMBULATORY_CARE_PROVIDER_SITE_OTHER): Payer: BC Managed Care – PPO

## 2014-06-15 ENCOUNTER — Telehealth: Payer: Self-pay | Admitting: *Deleted

## 2014-06-15 ENCOUNTER — Inpatient Hospital Stay (HOSPITAL_COMMUNITY)
Admission: AD | Admit: 2014-06-15 | Discharge: 2014-06-22 | DRG: 024 | Disposition: A | Discharge status: Home Health | Payer: BC Managed Care – PPO | Source: Ambulatory Visit | Attending: Neurosurgery | Admitting: Neurosurgery

## 2014-06-15 ENCOUNTER — Encounter: Payer: Self-pay | Admitting: Neurosurgery

## 2014-06-15 DIAGNOSIS — I509 Heart failure, unspecified: Secondary | ICD-10-CM | POA: Diagnosis present

## 2014-06-15 DIAGNOSIS — I5022 Chronic systolic (congestive) heart failure: Secondary | ICD-10-CM

## 2014-06-15 DIAGNOSIS — E876 Hypokalemia: Secondary | ICD-10-CM | POA: Diagnosis present

## 2014-06-15 DIAGNOSIS — E785 Hyperlipidemia, unspecified: Secondary | ICD-10-CM | POA: Diagnosis present

## 2014-06-15 DIAGNOSIS — F411 Generalized anxiety disorder: Secondary | ICD-10-CM

## 2014-06-15 DIAGNOSIS — Z951 Presence of aortocoronary bypass graft: Secondary | ICD-10-CM | POA: Diagnosis not present

## 2014-06-15 DIAGNOSIS — D638 Anemia in other chronic diseases classified elsewhere: Secondary | ICD-10-CM | POA: Diagnosis present

## 2014-06-15 DIAGNOSIS — G060 Intracranial abscess and granuloma: Secondary | ICD-10-CM | POA: Diagnosis present

## 2014-06-15 DIAGNOSIS — I2510 Atherosclerotic heart disease of native coronary artery without angina pectoris: Secondary | ICD-10-CM

## 2014-06-15 DIAGNOSIS — F1010 Alcohol abuse, uncomplicated: Secondary | ICD-10-CM | POA: Diagnosis present

## 2014-06-15 DIAGNOSIS — Z8673 Personal history of transient ischemic attack (TIA), and cerebral infarction without residual deficits: Secondary | ICD-10-CM

## 2014-06-15 DIAGNOSIS — S065XAA Traumatic subdural hemorrhage with loss of consciousness status unknown, initial encounter: Secondary | ICD-10-CM

## 2014-06-15 DIAGNOSIS — Z87891 Personal history of nicotine dependence: Secondary | ICD-10-CM | POA: Diagnosis not present

## 2014-06-15 DIAGNOSIS — G9389 Other specified disorders of brain: Secondary | ICD-10-CM | POA: Diagnosis present

## 2014-06-15 DIAGNOSIS — I10 Essential (primary) hypertension: Secondary | ICD-10-CM | POA: Diagnosis present

## 2014-06-15 DIAGNOSIS — I4819 Other persistent atrial fibrillation: Secondary | ICD-10-CM

## 2014-06-15 DIAGNOSIS — I4891 Unspecified atrial fibrillation: Secondary | ICD-10-CM | POA: Diagnosis present

## 2014-06-15 DIAGNOSIS — Z01818 Encounter for other preprocedural examination: Secondary | ICD-10-CM

## 2014-06-15 DIAGNOSIS — Z5181 Encounter for therapeutic drug level monitoring: Secondary | ICD-10-CM

## 2014-06-15 DIAGNOSIS — S065X9A Traumatic subdural hemorrhage with loss of consciousness of unspecified duration, initial encounter: Secondary | ICD-10-CM

## 2014-06-15 DIAGNOSIS — I5023 Acute on chronic systolic (congestive) heart failure: Secondary | ICD-10-CM

## 2014-06-15 LAB — CBC WITH DIFFERENTIAL/PLATELET
Basophils Absolute: 0 10*3/uL (ref 0.0–0.1)
Basophils Relative: 0 % (ref 0–1)
Eosinophils Absolute: 0 10*3/uL (ref 0.0–0.7)
Eosinophils Relative: 1 % (ref 0–5)
HCT: 26.6 % — ABNORMAL LOW (ref 39.0–52.0)
Hemoglobin: 8.8 g/dL — ABNORMAL LOW (ref 13.0–17.0)
LYMPHS ABS: 1.2 10*3/uL (ref 0.7–4.0)
Lymphocytes Relative: 19 % (ref 12–46)
MCH: 29 pg (ref 26.0–34.0)
MCHC: 33.1 g/dL (ref 30.0–36.0)
MCV: 87.8 fL (ref 78.0–100.0)
MONOS PCT: 13 % — AB (ref 3–12)
Monocytes Absolute: 0.8 10*3/uL (ref 0.1–1.0)
NEUTROS PCT: 67 % (ref 43–77)
Neutro Abs: 4.2 10*3/uL (ref 1.7–7.7)
Platelets: 223 10*3/uL (ref 150–400)
RBC: 3.03 MIL/uL — AB (ref 4.22–5.81)
RDW: 15.8 % — ABNORMAL HIGH (ref 11.5–15.5)
WBC: 6.3 10*3/uL (ref 4.0–10.5)

## 2014-06-15 LAB — GRAM STAIN

## 2014-06-15 LAB — BASIC METABOLIC PANEL
Anion gap: 11 (ref 5–15)
BUN: 12 mg/dL (ref 6–23)
BUN: 12 mg/dL (ref 6–23)
CALCIUM: 9.2 mg/dL (ref 8.4–10.5)
CO2: 24 meq/L (ref 19–32)
CO2: 26 meq/L (ref 19–32)
CREATININE: 0.9 mg/dL (ref 0.4–1.5)
Calcium: 8.9 mg/dL (ref 8.4–10.5)
Chloride: 102 mEq/L (ref 96–112)
Chloride: 101 mEq/L (ref 96–112)
Creatinine, Ser: 1.05 mg/dL (ref 0.50–1.35)
GFR calc non Af Amer: 71 mL/min — ABNORMAL LOW (ref >90)
GFR, EST AFRICAN AMERICAN: 82 mL/min — AB (ref >90)
GFR: 87.81 mL/min (ref >60.00)
GLUCOSE: 103 mg/dL — AB (ref 70–99)
Glucose, Bld: 92 mg/dL (ref 70–99)
POTASSIUM: 3.8 meq/L (ref 3.7–5.3)
Potassium: 4.1 mEq/L (ref 3.5–5.1)
SODIUM: 135 meq/L (ref 135–145)
Sodium: 138 mEq/L (ref 137–147)

## 2014-06-15 MED ORDER — LIDOCAINE HCL (PF) 1 % IJ SOLN
INTRAMUSCULAR | Status: AC
  Administered 2014-06-15: 16:00:00
  Filled 2014-06-15: qty 5

## 2014-06-15 MED ORDER — HYDROCODONE-ACETAMINOPHEN 5-325 MG PO TABS
1.0000 | ORAL_TABLET | ORAL | Status: DC | PRN
  Administered 2014-06-17 – 2014-06-22 (×5): 1 via ORAL
  Filled 2014-06-15 (×5): qty 1

## 2014-06-15 MED ORDER — ADULT MULTIVITAMIN W/MINERALS CH
1.0000 | ORAL_TABLET | Freq: Every day | ORAL | Status: DC
  Administered 2014-06-16 – 2014-06-22 (×7): 1 via ORAL
  Filled 2014-06-15 (×8): qty 1

## 2014-06-15 MED ORDER — PANTOPRAZOLE SODIUM 40 MG PO TBEC
40.0000 mg | DELAYED_RELEASE_TABLET | Freq: Every day | ORAL | Status: DC
  Administered 2014-06-15: 40 mg via ORAL
  Filled 2014-06-15: qty 1

## 2014-06-15 MED ORDER — FLUOXETINE HCL 20 MG PO TABS
20.0000 mg | ORAL_TABLET | Freq: Every day | ORAL | Status: DC
  Administered 2014-06-15 – 2014-06-22 (×8): 20 mg via ORAL
  Filled 2014-06-15 (×9): qty 1

## 2014-06-15 MED ORDER — LISINOPRIL 2.5 MG PO TABS
2.5000 mg | ORAL_TABLET | Freq: Every day | ORAL | Status: DC
  Administered 2014-06-16 – 2014-06-22 (×7): 2.5 mg via ORAL
  Filled 2014-06-15 (×8): qty 1

## 2014-06-15 MED ORDER — POTASSIUM CHLORIDE ER 10 MEQ PO TBCR
10.0000 meq | EXTENDED_RELEASE_TABLET | Freq: Every day | ORAL | Status: DC
  Administered 2014-06-16 – 2014-06-22 (×7): 10 meq via ORAL
  Filled 2014-06-15 (×8): qty 1

## 2014-06-15 MED ORDER — SIMVASTATIN 40 MG PO TABS
40.0000 mg | ORAL_TABLET | Freq: Every evening | ORAL | Status: DC
  Administered 2014-06-15 – 2014-06-22 (×7): 40 mg via ORAL
  Filled 2014-06-15 (×9): qty 1

## 2014-06-15 MED ORDER — VANCOMYCIN HCL IN DEXTROSE 1-5 GM/200ML-% IV SOLN
1000.0000 mg | Freq: Two times a day (BID) | INTRAVENOUS | Status: DC
  Administered 2014-06-16 – 2014-06-18 (×4): 1000 mg via INTRAVENOUS
  Filled 2014-06-15 (×7): qty 200

## 2014-06-15 MED ORDER — FOLIC ACID 1 MG PO TABS
1.0000 mg | ORAL_TABLET | Freq: Every day | ORAL | Status: DC
  Administered 2014-06-16 – 2014-06-22 (×7): 1 mg via ORAL
  Filled 2014-06-15 (×8): qty 1

## 2014-06-15 MED ORDER — VANCOMYCIN HCL 10 G IV SOLR
2000.0000 mg | Freq: Once | INTRAVENOUS | Status: AC
  Administered 2014-06-15: 2000 mg via INTRAVENOUS
  Filled 2014-06-15: qty 2000

## 2014-06-15 MED ORDER — METRONIDAZOLE IN NACL 5-0.79 MG/ML-% IV SOLN
500.0000 mg | Freq: Three times a day (TID) | INTRAVENOUS | Status: DC
  Administered 2014-06-15 – 2014-06-21 (×17): 500 mg via INTRAVENOUS
  Filled 2014-06-15 (×20): qty 100

## 2014-06-15 MED ORDER — DEXTROSE 5 % IV SOLN
2.0000 g | Freq: Two times a day (BID) | INTRAVENOUS | Status: DC
  Administered 2014-06-15 – 2014-06-17 (×3): 2 g via INTRAVENOUS
  Filled 2014-06-15 (×5): qty 2

## 2014-06-15 MED ORDER — FUROSEMIDE 40 MG PO TABS
40.0000 mg | ORAL_TABLET | Freq: Every day | ORAL | Status: DC
  Administered 2014-06-16 – 2014-06-22 (×7): 40 mg via ORAL
  Filled 2014-06-15 (×8): qty 1

## 2014-06-15 MED ORDER — LEVETIRACETAM 500 MG PO TABS
500.0000 mg | ORAL_TABLET | Freq: Two times a day (BID) | ORAL | Status: DC
  Administered 2014-06-15 – 2014-06-16 (×2): 500 mg via ORAL
  Filled 2014-06-15 (×3): qty 1

## 2014-06-15 MED ORDER — CARVEDILOL 25 MG PO TABS
25.0000 mg | ORAL_TABLET | Freq: Two times a day (BID) | ORAL | Status: DC
  Administered 2014-06-15 – 2014-06-16 (×2): 25 mg via ORAL
  Filled 2014-06-15 (×4): qty 1

## 2014-06-15 NOTE — H&P (Signed)
Alejandro [name] is an 68 y.o. male.   Chief Complaint: Headache confusion HPI: Patient is a 68 year old gentleman who was admitted with a traumatic subarachnoid hemorrhage as well as subdural hematoma and intracerebral hematoma. Patient recovered fairly well and went to operations during his initial hospitalization with implantation of his bone flap and was abdominal wall. Patient went to rehabilitation and was discharged from rehabilitation and is been followed up on a regular basis as an outpatient. He had a CT scan and subsequent MRI scan that show a ring-enhancing lesion in his right temporal lobe as well as fluid underneath his flap. Although clinically the patient continues to improve with increased balance and improved motor control he has had some persistent episodes of confusion and memory loss that may be slowly deteriorating. Because of this and with a results of his MRI scan I have recommended admission with an aspiration of the fluid in his flap has received infected if it is infected patient may need to have surgical aspiration of the intracerebral cystic lesion.  Past Medical History  Diagnosis Date  . Systolic heart failure   . 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 Dyslipidemia   . HTN (hypertension)   . Atrial fibrillation   . Cancer     around nose  . Tobacco abuse   . Alcohol abuse   . H/O hiatal hernia     Past Surgical History  Procedure Laterality Date  . Coronary artery bypass graft  12/24/2008    4 vessel  . Umbilical hernia repair  2010  . Hernia repair  2012  . Coronary artery bypass graft  2012  . Ventral hernia repair N/A 01/22/2013    Procedure: HERNIA REPAIR VENTRAL ADULT;  Surgeon: [name], MD;  Location: [place];  Service: General;  Laterality: N/A;  . Application of a-cell of chest/abdomen N/A 01/22/2013    Procedure: APPLICATION OF A-CELL OF CHEST/ABDOMEN;  Surgeon: [name], MD;  Location: [place];  Service: General;   Laterality: N/A;  . Cystoscopy N/A 01/22/2013    Procedure: Consuela Mimes;  Surgeon: [name], MD;  Location: [place];  Service: Urology;  Laterality: N/A;  Cystoscopy with balloon dilation. Insertion of coude catheter.  . Craniotomy Right 04/19/2014    Procedure: CRANIOTOMY HEMATOMA EVACUATION SUBDURAL;  Surgeon: [name], MD;  Location: [place] [facility];  Service: Neurosurgery;  Laterality: Right;  right  . Craniotomy Right 04/23/2014    Procedure: Craniotomy for Intracerebral Hemorrhage;  Surgeon: [name], MD;  Location: [place] [facility];  Service: Neurosurgery;  Laterality: Right;    Family History  Problem Relation Age of Onset  . Diabetes Mother   . Heart disease Mother   . Diabetes Father   . Heart disease Father   . Heart disease Brother     s/p CABG at 46  . Colon cancer Neg Hx   . Prostate cancer Neg Hx    Social History:  reports that he quit smoking about 13 months ago. His smoking use included Cigarettes. He has a 50 pack-year smoking history. He has never used smokeless tobacco. He reports that he drinks about 6 ounces of alcohol per week. He reports that he does not use illicit drugs.  Allergies: No Known Allergies   (Not in a hospital admission)  No results found for this or any previous visit (from the past 48 hour(s)). No results found.  Review of Systems  Constitutional: Negative.   Eyes: Negative.   Respiratory: Negative.   Cardiovascular: Negative.   Gastrointestinal: Negative.   Skin: Negative.   Neurological: Positive for dizziness and headaches.  Endo/Heme/Allergies: Negative.   Psychiatric/Behavioral: Negative.     There were no vitals taken for this visit. Physical Exam  Constitutional: He is oriented to person, place, and time. He appears well-developed and well-nourished.  Eyes: Pupils are equal, round, and reactive to light.  Neck: Normal range of motion.  GI: Soft.  Musculoskeletal: Normal range of motion.  Neurological: He is alert and  oriented to person, place, and time. He has normal strength. GCS eye subscore is 4. GCS verbal subscore is 5. GCS motor subscore is 6.  Patient is awake alert oriented pupils equal extraocular was intact cranial nerves are intact strength is 5 out of 5 in his upper and lower extremities with no pronator drift. Flap on the right side of his head still is soft with a small amount of fluid collection in for aspect of the flap.     Assessment/Plan 59 years and presents with a cystic lesion in his right temporal lobe it could be a resolving hematoma or into cervical abscess. We'll admit the patient will undergo aspiration of the fluid underneath this flap tested it is infected we'll proceed forward surgical aspiration of his intracerebral cystic mass.  Jhania Etherington P 06/15/2014, 12:36 PM

## 2014-06-15 NOTE — Progress Notes (Signed)
  ANTIBIOTIC CONSULT NOTE - INITIAL  Pharmacy Consult for vancomycin Indication: brain abscess  No Known Allergies  Patient Measurements: Height: 5\' 8"  (172.7 cm) Weight: 223 lb 12.3 oz (101.5 kg) IBW/kg (Calculated) : 68.4  Vital Signs: Temp: 97.4 F (36.3 C) (07/14 1504) Temp src: Oral (07/14 1504) BP: 99/56 mmHg (07/14 1504) Intake/Output from previous day:   Intake/Output from this shift:    Labs: No results found for this basename: WBC, HGB, PLT, LABCREA, CREATININE,  in the last 72 hours Estimated Creatinine Clearance: 90.7 ml/min (by C-G formula based on Cr of 0.9). No results found for this basename: VANCOTROUGH, VANCOPEAK, VANCORANDOM, GENTTROUGH, GENTPEAK, GENTRANDOM, TOBRATROUGH, TOBRAPEAK, TOBRARND, AMIKACINPEAK, AMIKACINTROU, AMIKACIN,  in the last 72 hours   Microbiology: No results found for this or any previous visit (from the past 720 hour(s)).  Medical History: Past Medical History  Diagnosis Date  . Systolic heart failure   . 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 Dyslipidemia   . HTN (hypertension)   . Atrial fibrillation   . Cancer     around nose  . Tobacco abuse   . Alcohol abuse   . H/O hiatal hernia     Medications:  Prescriptions prior to admission  Medication Sig Dispense Refill  . carvedilol (COREG) 12.5 MG tablet Take 12.5 mg by mouth 2 (two) times daily with a meal.      . FLUoxetine (PROZAC) 20 MG tablet Take 1 tablet (20 mg total) by mouth daily.  30 tablet  1  . folic acid (FOLVITE) 1 MG tablet Take 1 tablet (1 mg total) by mouth daily.  30 tablet  1  . furosemide (LASIX) 40 MG tablet Take 1 tablet (40 mg total) by mouth daily.  90 tablet  1  . levETIRAcetam (KEPPRA) 500 MG tablet Take 1 tablet (500 mg total) by mouth 2 (two) times daily.  60 tablet  1  . lisinopril (ZESTRIL) 2.5 MG tablet Take 1 tablet (2.5 mg total) by mouth daily.  30 tablet  6  . Multiple Vitamin (MULTIVITAMIN WITH MINERALS) TABS tablet  Take 1 tablet by mouth daily.      . pantoprazole (PROTONIX) 40 MG tablet Take 1 tablet (40 mg total) by mouth at bedtime.  30 tablet  1  . potassium chloride (K-DUR) 10 MEQ tablet Take 1 tablet daily  30 tablet  3  . simvastatin (ZOCOR) 40 MG tablet Take 1 tablet (40 mg total) by mouth every evening.  30 tablet  1   Assessment: 68 year old man s/p subdural hematoma with craniotomy on 04/19/14, re-exporation on 04/23/14.  He had a ring enhancing lesion in his right temporal lobe and fluid underneath his flap.  Dr. Saintclair Halsted aspirated the fluid today.  Vancomycin to start for possible abscess.  Goal of Therapy:  Vancomycin trough level 15-20 mcg/ml  Plan:  Measure antibiotic drug levels at steady state Follow up culture results Vancomycin 2g IV x 1 dose, then 1g IV q12h.  Follow renal function with MD.  Candie Mile 06/15/2014,4:37 PM

## 2014-06-15 NOTE — Telephone Encounter (Signed)
wife notified about lab results with verbal understanding, pt is currently admitted at this time

## 2014-06-15 NOTE — Procedures (Signed)
Preprocedure diagnosis: Rule out subgaleal abscess and intracerebral abscess  Post procedure diagnosis same  Procedure aspiration of subgaleal fluid  Procedure note patient is a 68 year old gentleman who presents with a CT scan MRI scan showing a subgaleal fluid collection with extension in the right temporal lobe with enhancement possibly consistent with the resolving hematoma or intracerebral abscess. Patient was admitted to the step down unit ICU in the right side of his head was prepped and draped in routine sterile fashion using a sterile needle I aspirated a subgaleal fluid and frank pus was aspirated. This was then sent off for Gram stain culture and cell count. Once the results are back out of place him on IV antibiotics and we will make a determination as to whether to proceed forward aspiration of intracerebral cyst versus IV antibiotics.

## 2014-06-16 ENCOUNTER — Encounter (HOSPITAL_COMMUNITY)
Admission: AD | Disposition: A | Discharge status: Home Health | Payer: Self-pay | Source: Ambulatory Visit | Attending: Neurosurgery

## 2014-06-16 ENCOUNTER — Encounter (HOSPITAL_COMMUNITY): Payer: BC Managed Care – PPO | Admitting: Anesthesiology

## 2014-06-16 ENCOUNTER — Encounter (HOSPITAL_COMMUNITY): Payer: Self-pay | Admitting: Anesthesiology

## 2014-06-16 ENCOUNTER — Inpatient Hospital Stay (HOSPITAL_COMMUNITY): Payer: BC Managed Care – PPO | Admitting: Anesthesiology

## 2014-06-16 DIAGNOSIS — I4891 Unspecified atrial fibrillation: Secondary | ICD-10-CM

## 2014-06-16 DIAGNOSIS — Z01818 Encounter for other preprocedural examination: Secondary | ICD-10-CM

## 2014-06-16 DIAGNOSIS — G060 Intracranial abscess and granuloma: Principal | ICD-10-CM | POA: Diagnosis present

## 2014-06-16 DIAGNOSIS — I5023 Acute on chronic systolic (congestive) heart failure: Secondary | ICD-10-CM

## 2014-06-16 DIAGNOSIS — F411 Generalized anxiety disorder: Secondary | ICD-10-CM

## 2014-06-16 HISTORY — PX: CRANIOTOMY: SHX93

## 2014-06-16 LAB — CBC
HEMATOCRIT: 30.9 % — AB (ref 39.0–52.0)
Hemoglobin: 10.4 g/dL — ABNORMAL LOW (ref 13.0–17.0)
MCH: 29.7 pg (ref 26.0–34.0)
MCHC: 33.7 g/dL (ref 30.0–36.0)
MCV: 88.3 fL (ref 78.0–100.0)
PLATELETS: 198 10*3/uL (ref 150–400)
RBC: 3.5 MIL/uL — AB (ref 4.22–5.81)
RDW: 15.8 % — AB (ref 11.5–15.5)
WBC: 6.9 10*3/uL (ref 4.0–10.5)

## 2014-06-16 LAB — URINALYSIS, ROUTINE W REFLEX MICROSCOPIC
BILIRUBIN URINE: NEGATIVE
GLUCOSE, UA: NEGATIVE mg/dL
Hgb urine dipstick: NEGATIVE
Ketones, ur: NEGATIVE mg/dL
LEUKOCYTES UA: NEGATIVE
Nitrite: NEGATIVE
PH: 6 (ref 5.0–8.0)
Protein, ur: NEGATIVE mg/dL
Specific Gravity, Urine: 1.022 (ref 1.005–1.030)
Urobilinogen, UA: 1 mg/dL (ref 0.0–1.0)

## 2014-06-16 LAB — BASIC METABOLIC PANEL
Anion gap: 16 — ABNORMAL HIGH (ref 5–15)
BUN: 9 mg/dL (ref 6–23)
CO2: 21 meq/L (ref 19–32)
Calcium: 8.3 mg/dL — ABNORMAL LOW (ref 8.4–10.5)
Chloride: 101 mEq/L (ref 96–112)
Creatinine, Ser: 0.77 mg/dL (ref 0.50–1.35)
GFR calc Af Amer: >90 mL/min (ref >90)
GLUCOSE: 103 mg/dL — AB (ref 70–99)
POTASSIUM: 4 meq/L (ref 3.7–5.3)
Sodium: 138 mEq/L (ref 137–147)

## 2014-06-16 LAB — PROTIME-INR
INR: 1.1 (ref 0.00–1.49)
PROTHROMBIN TIME: 14.2 s (ref 11.6–15.2)

## 2014-06-16 LAB — MRSA PCR SCREENING: MRSA BY PCR: NEGATIVE

## 2014-06-16 LAB — PREPARE RBC (CROSSMATCH)

## 2014-06-16 SURGERY — CRANIOTOMY TUMOR EXCISION
Anesthesia: General

## 2014-06-16 MED ORDER — LEVETIRACETAM IN NACL 500 MG/100ML IV SOLN
500.0000 mg | Freq: Two times a day (BID) | INTRAVENOUS | Status: DC
  Administered 2014-06-16 – 2014-06-19 (×6): 500 mg via INTRAVENOUS
  Filled 2014-06-16 (×7): qty 100

## 2014-06-16 MED ORDER — DEXAMETHASONE SODIUM PHOSPHATE 4 MG/ML IJ SOLN
INTRAMUSCULAR | Status: AC
  Filled 2014-06-16: qty 1

## 2014-06-16 MED ORDER — NEOSTIGMINE METHYLSULFATE 10 MG/10ML IV SOLN
INTRAVENOUS | Status: AC
  Filled 2014-06-16: qty 1

## 2014-06-16 MED ORDER — THROMBIN 20000 UNITS EX SOLR
CUTANEOUS | Status: DC | PRN
  Administered 2014-06-16: 16:00:00 via TOPICAL

## 2014-06-16 MED ORDER — POTASSIUM CHLORIDE IN NACL 20-0.9 MEQ/L-% IV SOLN
INTRAVENOUS | Status: DC
  Administered 2014-06-16 – 2014-06-17 (×2): via INTRAVENOUS
  Administered 2014-06-19: 50 mL/h via INTRAVENOUS
  Filled 2014-06-16 (×5): qty 1000

## 2014-06-16 MED ORDER — ARTIFICIAL TEARS OP OINT
TOPICAL_OINTMENT | OPHTHALMIC | Status: DC | PRN
  Administered 2014-06-16: 1 via OPHTHALMIC

## 2014-06-16 MED ORDER — GLYCOPYRROLATE 0.2 MG/ML IJ SOLN
INTRAMUSCULAR | Status: AC
  Filled 2014-06-16: qty 3

## 2014-06-16 MED ORDER — ROCURONIUM BROMIDE 100 MG/10ML IV SOLN
INTRAVENOUS | Status: DC | PRN
  Administered 2014-06-16: 50 mg via INTRAVENOUS

## 2014-06-16 MED ORDER — ONDANSETRON HCL 4 MG PO TABS: 4.0000 mg | ORAL_TABLET | ORAL | Status: DC | PRN

## 2014-06-16 MED ORDER — NEOSTIGMINE METHYLSULFATE 10 MG/10ML IV SOLN
INTRAVENOUS | Status: DC | PRN
  Administered 2014-06-16: 4 mg via INTRAVENOUS

## 2014-06-16 MED ORDER — PANTOPRAZOLE SODIUM 40 MG IV SOLR
40.0000 mg | Freq: Every day | INTRAVENOUS | Status: DC
  Administered 2014-06-16 – 2014-06-18 (×3): 40 mg via INTRAVENOUS
  Filled 2014-06-16 (×4): qty 40

## 2014-06-16 MED ORDER — VECURONIUM BROMIDE 10 MG IV SOLR
INTRAVENOUS | Status: AC
  Filled 2014-06-16: qty 10

## 2014-06-16 MED ORDER — GLYCOPYRROLATE 0.2 MG/ML IJ SOLN
INTRAMUSCULAR | Status: DC | PRN
  Administered 2014-06-16: 0.6 mg via INTRAVENOUS

## 2014-06-16 MED ORDER — HYDROMORPHONE HCL PF 1 MG/ML IJ SOLN
0.5000 mg | INTRAMUSCULAR | Status: DC | PRN
Start: 2014-06-16 — End: 2014-06-22

## 2014-06-16 MED ORDER — ONDANSETRON HCL 4 MG/2ML IJ SOLN
INTRAMUSCULAR | Status: DC | PRN
  Administered 2014-06-16: 4 mg via INTRAVENOUS

## 2014-06-16 MED ORDER — PHENYLEPHRINE HCL 10 MG/ML IJ SOLN
10.0000 mg | INTRAVENOUS | Status: DC | PRN
  Administered 2014-06-16: 50 ug/min via INTRAVENOUS

## 2014-06-16 MED ORDER — SODIUM CHLORIDE 0.9 % IV SOLN
INTRAVENOUS | Status: DC | PRN
  Administered 2014-06-16: 14:00:00 via INTRAVENOUS

## 2014-06-16 MED ORDER — LIDOCAINE HCL (CARDIAC) 20 MG/ML IV SOLN
INTRAVENOUS | Status: AC
  Filled 2014-06-16: qty 5

## 2014-06-16 MED ORDER — SODIUM CHLORIDE 0.9 % IV SOLN
INTRAVENOUS | Status: DC
  Administered 2014-06-16: 10:00:00 via INTRAVENOUS

## 2014-06-16 MED ORDER — PROPOFOL 10 MG/ML IV BOLUS
INTRAVENOUS | Status: DC | PRN
  Administered 2014-06-16: 40 mg via INTRAVENOUS
  Administered 2014-06-16: 100 mg via INTRAVENOUS

## 2014-06-16 MED ORDER — LABETALOL HCL 5 MG/ML IV SOLN
INTRAVENOUS | Status: AC
  Filled 2014-06-16: qty 4

## 2014-06-16 MED ORDER — MICROFIBRILLAR COLL HEMOSTAT EX PADS
MEDICATED_PAD | CUTANEOUS | Status: DC | PRN
  Administered 2014-06-16: 1 via TOPICAL

## 2014-06-16 MED ORDER — ROCURONIUM BROMIDE 50 MG/5ML IV SOLN
INTRAVENOUS | Status: AC
  Filled 2014-06-16: qty 1

## 2014-06-16 MED ORDER — SODIUM CHLORIDE 0.9 % IJ SOLN
INTRAMUSCULAR | Status: AC
  Filled 2014-06-16: qty 10

## 2014-06-16 MED ORDER — 0.9 % SODIUM CHLORIDE (POUR BTL) OPTIME
TOPICAL | Status: DC | PRN
  Administered 2014-06-16 (×2): 1000 mL

## 2014-06-16 MED ORDER — ONDANSETRON HCL 4 MG/2ML IJ SOLN
INTRAMUSCULAR | Status: AC
  Filled 2014-06-16: qty 2

## 2014-06-16 MED ORDER — LIDOCAINE-EPINEPHRINE 1 %-1:100000 IJ SOLN
INTRAMUSCULAR | Status: DC | PRN
  Administered 2014-06-16: 3 mL

## 2014-06-16 MED ORDER — PROMETHAZINE HCL 25 MG PO TABS: 12.5000 mg | ORAL_TABLET | ORAL | Status: DC | PRN

## 2014-06-16 MED ORDER — PROPOFOL 10 MG/ML IV BOLUS
INTRAVENOUS | Status: AC
  Filled 2014-06-16: qty 20

## 2014-06-16 MED ORDER — CARVEDILOL 12.5 MG PO TABS
12.5000 mg | ORAL_TABLET | Freq: Two times a day (BID) | ORAL | Status: DC
  Administered 2014-06-17 – 2014-06-22 (×11): 12.5 mg via ORAL
  Filled 2014-06-16 (×15): qty 1

## 2014-06-16 MED ORDER — ONDANSETRON HCL 4 MG/2ML IJ SOLN: 4.0000 mg | INTRAMUSCULAR | Status: DC | PRN

## 2014-06-16 MED ORDER — FENTANYL CITRATE 0.05 MG/ML IJ SOLN
INTRAMUSCULAR | Status: DC | PRN
  Administered 2014-06-16: 25 ug via INTRAVENOUS
  Administered 2014-06-16: 50 ug via INTRAVENOUS
  Administered 2014-06-16: 25 ug via INTRAVENOUS
  Administered 2014-06-16: 50 ug via INTRAVENOUS

## 2014-06-16 MED ORDER — DEXAMETHASONE SODIUM PHOSPHATE 4 MG/ML IJ SOLN
INTRAMUSCULAR | Status: DC | PRN
  Administered 2014-06-16: 4 mg via INTRAVENOUS

## 2014-06-16 MED ORDER — PHENYLEPHRINE HCL 10 MG/ML IJ SOLN
INTRAMUSCULAR | Status: AC
  Filled 2014-06-16: qty 1

## 2014-06-16 MED ORDER — BACITRACIN 50000 UNITS IM SOLR
INTRAMUSCULAR | Status: DC | PRN
  Administered 2014-06-16: 16:00:00

## 2014-06-16 MED ORDER — ARTIFICIAL TEARS OP OINT
TOPICAL_OINTMENT | OPHTHALMIC | Status: AC
  Filled 2014-06-16: qty 3.5

## 2014-06-16 MED ORDER — LABETALOL HCL 5 MG/ML IV SOLN
10.0000 mg | INTRAVENOUS | Status: DC | PRN
  Administered 2014-06-16 (×2): 10 mg via INTRAVENOUS
  Administered 2014-06-19: 20 mg via INTRAVENOUS
  Filled 2014-06-16: qty 4

## 2014-06-16 MED ORDER — FENTANYL CITRATE 0.05 MG/ML IJ SOLN
INTRAMUSCULAR | Status: AC
  Filled 2014-06-16: qty 5

## 2014-06-16 MED ORDER — PHENYLEPHRINE 40 MCG/ML (10ML) SYRINGE FOR IV PUSH (FOR BLOOD PRESSURE SUPPORT)
PREFILLED_SYRINGE | INTRAVENOUS | Status: AC
  Filled 2014-06-16: qty 10

## 2014-06-16 MED ORDER — LIDOCAINE HCL (CARDIAC) 20 MG/ML IV SOLN
INTRAVENOUS | Status: DC | PRN
  Administered 2014-06-16: 80 mg via INTRAVENOUS

## 2014-06-16 MED ORDER — BACITRACIN ZINC 500 UNIT/GM EX OINT
TOPICAL_OINTMENT | CUTANEOUS | Status: DC | PRN
  Administered 2014-06-16: 1 via TOPICAL

## 2014-06-16 MED ORDER — SUCCINYLCHOLINE CHLORIDE 20 MG/ML IJ SOLN
INTRAMUSCULAR | Status: AC
  Filled 2014-06-16: qty 1

## 2014-06-16 SURGICAL SUPPLY — 96 items
BAG DECANTER FOR FLEXI CONT (MISCELLANEOUS) ×3 IMPLANT
BANDAGE GAUZE 4  KLING STR (GAUZE/BANDAGES/DRESSINGS) IMPLANT
BLADE 10 SAFETY STRL DISP (BLADE) IMPLANT
BLADE SURG 11 STRL SS (BLADE) IMPLANT
BLADE SURG ROTATE 9660 (MISCELLANEOUS) ×3 IMPLANT
BNDG COHESIVE 4X5 TAN NS LF (GAUZE/BANDAGES/DRESSINGS) IMPLANT
BRUSH SCRUB EZ 1% IODOPHOR (MISCELLANEOUS) IMPLANT
BRUSH SCRUB EZ PLAIN DRY (MISCELLANEOUS) ×3 IMPLANT
BUR ACORN 9.0 PRECISION (BURR) ×2 IMPLANT
BUR ACORN 9.0MM PRECISION (BURR) ×1
BUR ROUTER D-58 CRANI (BURR) ×3 IMPLANT
CANISTER SUCT 3000ML (MISCELLANEOUS) ×6 IMPLANT
CLIP TI MEDIUM 6 (CLIP) IMPLANT
CONT SPEC 4OZ CLIKSEAL STRL BL (MISCELLANEOUS) ×3 IMPLANT
CORDS BIPOLAR (ELECTRODE) ×3 IMPLANT
COVER TABLE BACK 60X90 (DRAPES) IMPLANT
DECANTER SPIKE VIAL GLASS SM (MISCELLANEOUS) ×3 IMPLANT
DERMABOND ADVANCED (GAUZE/BANDAGES/DRESSINGS)
DERMABOND ADVANCED .7 DNX12 (GAUZE/BANDAGES/DRESSINGS) IMPLANT
DRAPE CAMERA VIDEO/LASER (DRAPES) IMPLANT
DRAPE LONG LASER MIC (DRAPES) IMPLANT
DRAPE MICROSCOPE ZEISS OPMI (DRAPES) IMPLANT
DRAPE STERI IOBAN 125X83 (DRAPES) IMPLANT
DRAPE WARM FLUID 44X44 (DRAPE) ×3 IMPLANT
DRSG OPSITE 4X5.5 SM (GAUZE/BANDAGES/DRESSINGS) IMPLANT
ELECT CAUTERY BLADE 6.4 (BLADE) IMPLANT
ELECT REM PT RETURN 9FT ADLT (ELECTROSURGICAL) ×3
ELECTRODE REM PT RTRN 9FT ADLT (ELECTROSURGICAL) ×1 IMPLANT
GAUZE SPONGE 4X4 16PLY XRAY LF (GAUZE/BANDAGES/DRESSINGS) IMPLANT
GLOVE BIO SURGEON STRL SZ 6.5 (GLOVE) IMPLANT
GLOVE BIO SURGEON STRL SZ7 (GLOVE) IMPLANT
GLOVE BIO SURGEON STRL SZ7.5 (GLOVE) IMPLANT
GLOVE BIO SURGEON STRL SZ8 (GLOVE) ×3 IMPLANT
GLOVE BIO SURGEON STRL SZ8.5 (GLOVE) IMPLANT
GLOVE BIO SURGEONS STRL SZ 6.5 (GLOVE)
GLOVE BIOGEL M 8.0 STRL (GLOVE) IMPLANT
GLOVE BIOGEL PI IND STRL 7.0 (GLOVE) ×3 IMPLANT
GLOVE BIOGEL PI IND STRL 8 (GLOVE) ×4 IMPLANT
GLOVE BIOGEL PI INDICATOR 7.0 (GLOVE) ×6
GLOVE BIOGEL PI INDICATOR 8 (GLOVE) ×8
GLOVE ECLIPSE 6.5 STRL STRAW (GLOVE) IMPLANT
GLOVE ECLIPSE 7.0 STRL STRAW (GLOVE) IMPLANT
GLOVE ECLIPSE 7.5 STRL STRAW (GLOVE) ×12 IMPLANT
GLOVE ECLIPSE 8.0 STRL XLNG CF (GLOVE) IMPLANT
GLOVE ECLIPSE 8.5 STRL (GLOVE) IMPLANT
GLOVE EXAM NITRILE LRG STRL (GLOVE) IMPLANT
GLOVE EXAM NITRILE MD LF STRL (GLOVE) IMPLANT
GLOVE EXAM NITRILE XL STR (GLOVE) IMPLANT
GLOVE EXAM NITRILE XS STR PU (GLOVE) IMPLANT
GLOVE INDICATOR 6.5 STRL GRN (GLOVE) IMPLANT
GLOVE INDICATOR 7.0 STRL GRN (GLOVE) IMPLANT
GLOVE INDICATOR 7.5 STRL GRN (GLOVE) IMPLANT
GLOVE INDICATOR 8.0 STRL GRN (GLOVE) IMPLANT
GLOVE INDICATOR 8.5 STRL (GLOVE) ×3 IMPLANT
GLOVE OPTIFIT SS 8.0 STRL (GLOVE) IMPLANT
GLOVE SURG SS PI 6.5 STRL IVOR (GLOVE) IMPLANT
GOWN STRL REUS W/ TWL LRG LVL3 (GOWN DISPOSABLE) IMPLANT
GOWN STRL REUS W/ TWL XL LVL3 (GOWN DISPOSABLE) ×1 IMPLANT
GOWN STRL REUS W/TWL 2XL LVL3 (GOWN DISPOSABLE) ×6 IMPLANT
GOWN STRL REUS W/TWL LRG LVL3 (GOWN DISPOSABLE)
GOWN STRL REUS W/TWL XL LVL3 (GOWN DISPOSABLE) ×2
HEMOSTAT SURGICEL 2X14 (HEMOSTASIS) ×3 IMPLANT
HOOK DURA (MISCELLANEOUS) IMPLANT
KIT BASIN OR (CUSTOM PROCEDURE TRAY) ×3 IMPLANT
KIT ROOM TURNOVER OR (KITS) ×3 IMPLANT
NEEDLE HYPO 25X1 1.5 SAFETY (NEEDLE) ×3 IMPLANT
NS IRRIG 1000ML POUR BTL (IV SOLUTION) ×6 IMPLANT
PACK CRANIOTOMY (CUSTOM PROCEDURE TRAY) ×3 IMPLANT
PAD ARMBOARD 7.5X6 YLW CONV (MISCELLANEOUS) ×9 IMPLANT
PAD EYE OVAL STERILE LF (GAUZE/BANDAGES/DRESSINGS) IMPLANT
PATTIES SURGICAL .25X.25 (GAUZE/BANDAGES/DRESSINGS) IMPLANT
PATTIES SURGICAL .5 X.5 (GAUZE/BANDAGES/DRESSINGS) IMPLANT
PATTIES SURGICAL .5 X3 (DISPOSABLE) IMPLANT
PATTIES SURGICAL 1X1 (DISPOSABLE) IMPLANT
RUBBERBAND STERILE (MISCELLANEOUS) IMPLANT
SPONGE GAUZE 4X4 12PLY (GAUZE/BANDAGES/DRESSINGS) IMPLANT
SPONGE NEURO XRAY DETECT 1X3 (DISPOSABLE) IMPLANT
SPONGE SURGIFOAM ABS GEL 100 (HEMOSTASIS) ×3 IMPLANT
SPONGE SURGIFOAM ABS GEL 12-7 (HEMOSTASIS) IMPLANT
STAPLER VISISTAT 35W (STAPLE) ×3 IMPLANT
SUT ETHILON 3 0 FSL (SUTURE) ×3 IMPLANT
SUT NURALON 4 0 TR CR/8 (SUTURE) ×3 IMPLANT
SUT VIC AB 2-0 CT1 18 (SUTURE) ×9 IMPLANT
SWAB COLLECTION DEVICE MRSA (MISCELLANEOUS) ×6 IMPLANT
SYR 20ML ECCENTRIC (SYRINGE) ×3 IMPLANT
SYR CONTROL 10ML LL (SYRINGE) ×3 IMPLANT
TIP SONASTAR PREC MISONIX 1.1 (TRAY / TRAY PROCEDURE) IMPLANT
TOWEL OR 17X24 6PK STRL BLUE (TOWEL DISPOSABLE) IMPLANT
TOWEL OR 17X26 10 PK STRL BLUE (TOWEL DISPOSABLE) ×3 IMPLANT
TRAY FOLEY CATH 14FRSI W/METER (CATHETERS) IMPLANT
TRAY FOLEY CATH 16FRSI W/METER (SET/KITS/TRAYS/PACK) ×3 IMPLANT
TUBE ANAEROBIC SPECIMEN COL (MISCELLANEOUS) ×6 IMPLANT
TUBE CONNECTING 12'X1/4 (SUCTIONS)
TUBE CONNECTING 12X1/4 (SUCTIONS) IMPLANT
UNDERPAD 30X30 INCONTINENT (UNDERPADS AND DIAPERS) IMPLANT
WATER STERILE IRR 1000ML POUR (IV SOLUTION) ×3 IMPLANT

## 2014-06-16 NOTE — Progress Notes (Signed)
CARE MANAGEMENT NOTE 06/16/2014  Patient:  Alejandro [name], Alejandro [name]   Account Number:  [id number]  Date Initiated:  06/16/2014  Documentation initiated by:  GRAVES-BIGELOW,Muhammad Vacca  a Subjective/Objective Assessment:   Pt admitted for headache. S/p craniectomy with bone flap. Per MD notes pt noted to have confusion and headache in rehab.  MRI showed ring enhancing lesion in Rt temporal lobe concerning for abscess. Recommended I&D.     Action/Plan:   CM will continue to monitor for disposition needs.   Anticipated DC Date:  06/20/2014   Anticipated DC Plan:  [place]  CM consult      Choice offered to / List presented to:             Status of service:  In process, will continue to follow Medicare Important Message given?   (If response is "NO", the following Medicare IM given date fields will be blank) Date Medicare IM given:   Medicare IM given by:   Date Additional Medicare IM given:   Additional Medicare IM given by:    Discharge Disposition:    Per UR Regulation:  Reviewed for med. necessity/level of care/duration of stay  If discussed at [place] of Stay Meetings, dates discussed:    Comments:

## 2014-06-16 NOTE — Op Note (Addendum)
Preoperative diagnosis: Subdural empyema inta- cerebral abscess    Postoperative diagnosis: Subdural empyema  Procedure: Reexploration of right pterional craniotomy for evacuation of subdural empyema an intracerebral abscess using intraoperative ultrasound  Surgeon: Dominica Severin Drue Camera  Anesthesia: Gen.  EBL: Minimal  History of present illness: Patient is a very +68 year old some improvement from previous craniotomies for evacuation of subdural and partial right temporal lobectomy patient is developed a ring-enhancing mass in his right temporal lobe and under the galea and the site where his craniotomy flap had been removed aspiration of this preoperatively revealed frank pus and Staphylococcus aureus the patient was recommended reexploration and I&D of subdural empyema and possible intracerebral abscess. Excess over risks and benefits of depression the patient as well as perioperative course expectations of outcome from surgery he understood and agreed to proceed forward.  Operative procedure: Patientwas brought into the room and induced under general anesthesia and positioned supine with a bump under his right shoulder his head turned fully left exposing the right side of his head after adequate shaving prepping and draping the right side of his head. His previous  craniotomy incision was incised and the flap was reflected medially a final flush the flap frank pus came expressed from the subcutaneous dural a subgaleal compartment this was swabbed and sent for cultures. I then opened the dura and tracked the subdural fluid along the infratemporal fossa were pushed up against intraoperative temporal lobe. It seemed like the vast majority of this fluid was subdural empyema displacing the address her temporal lobe although it had eroded elevated through part of the inferior temporal gyrus as well. This is all irrigate debris washed out and then I brought intraoperative ultrasound and solid no residual remnant  of a temporal cyst I did see her ventricular anatomy well defined. The field was copious irrigated the flap was debrided from dead necrotic tissue and the dura was reapproximated and closed the galea with interrupted Vicryl and a running nylon the skin it was dressed space was covered in stable condition. All needle counts and sponge counts were correct.

## 2014-06-16 NOTE — Progress Notes (Signed)
PCCM Progress Note:  S:  Returned from OR after re-exploration of right parietal craniotomy for evacuation of subdural empyema and intracerebral abscess.  Pt reports that he is doing well, denies any SOB, chest pain, N/V.  States that pain is well controlled.  O:  BP 140/78  Pulse 90  Temp(Src) 97.9 F (36.6 C) (Oral)  Resp 15  Ht 5\' 8"  (1.727 m)  Wt 101.5 kg (223 lb 12.3 oz)  BMI 34.03 kg/m2  SpO2 99%  Physical Exam: Gen:  WDWN male, resting in bed post op, in NAD. HEENT:  Scalp dressing in place, C/D/I.  Right scalp wound vac in place. Heart:  RRR, no M/R/G. Lungs:  CTAB, no W/R/R. Abd:  BS hypoactive, soft, NT/ND. Ext:  1+ edema.  A/P:  Subdural empyema and intracerbral abscess - s/p OR 7/15 for evacution - Post op care per NSGY. - Labetalol PRN to maintain SBP < 160. - Pain control. - IS/pulmonary toiletry.   Montey Hora, [facility] Pgr: [phone]  or [phone]  06/16/2014, 9:55 PM

## 2014-06-16 NOTE — Anesthesia Postprocedure Evaluation (Signed)
  Anesthesia Post-op Note  Patient: Alejandro [name]  Procedure(s) Performed: Procedure(s): Craniotomy for intracerebral abscess and subdural empyema (N/A)  Patient Location: PACU  Anesthesia Type:General  Level of Consciousness: awake and alert   Airway and Oxygen Therapy: Patient Spontanous Breathing  Post-op Pain: mild  Post-op Assessment: Post-op Vital signs reviewed and Patient's Cardiovascular Status Stable  Post-op Vital Signs: stable  Last Vitals:  Filed Vitals:   06/16/14 1730  BP:   Pulse: 84  Temp:   Resp: 17    Complications: No apparent anesthesia complications

## 2014-06-16 NOTE — Progress Notes (Signed)
UR Completed Kamalani Mastro Graves-Bigelow, RN,BSN [phone]  

## 2014-06-16 NOTE — Anesthesia Preprocedure Evaluation (Signed)
Anesthesia Evaluation  Patient identified by MRN, date of birth, ID band Patient awake    Reviewed: Allergy & Precautions, H&P , NPO status , Patient's Chart, lab work & pertinent test results  Airway       Dental   Pulmonary former smoker,  breath sounds clear to auscultation        Cardiovascular hypertension, + CAD and + CABG Rhythm:Irregular Rate:Normal     Neuro/Psych    GI/Hepatic hiatal hernia,   Endo/Other    Renal/GU      Musculoskeletal   Abdominal   Peds  Hematology  (+) Blood dyscrasia, anemia ,   Anesthesia Other Findings   Reproductive/Obstetrics                           Anesthesia Physical Anesthesia Plan  ASA: III  Anesthesia Plan: General   Post-op Pain Management:    Induction: Intravenous  Airway Management Planned: Oral ETT  Additional Equipment: Arterial line  Intra-op Plan:   Post-operative Plan: Possible Post-op intubation/ventilation and Extubation in OR  Informed Consent: I have reviewed the patients History and Physical, chart, labs and discussed the procedure including the risks, benefits and alternatives for the proposed anesthesia with the patient or authorized representative who has indicated his/her understanding and acceptance.   Dental advisory given  Plan Discussed with: CRNA and Surgeon  Anesthesia Plan Comments:         Anesthesia Quick Evaluation

## 2014-06-16 NOTE — Progress Notes (Signed)
Subjective: Patient reports Overall he is doing okay no significant headache nausea vomiting no fevers  Objective: Vital signs in last 24 hours: Temp:  [97.4 F (36.3 C)-98.5 F (36.9 C)] 98.2 F (36.8 C) (07/15 0736) Pulse Rate:  [69-84] 80 (07/15 0736) Resp:  [18-19] 19 (07/15 0736) BP: (99-155)/(56-82) 138/63 mmHg (07/15 0736) SpO2:  [95 %-99 %] 98 % (07/15 0736) Weight:  [101.5 kg (223 lb 12.3 oz)] 101.5 kg (223 lb 12.3 oz) (07/14 1504)  Intake/Output from previous day: 07/14 0701 - 07/15 0700 In: 240 [P.O.:240] Out: -  Intake/Output this shift:    Awake alert oriented neurologically stable  Lab Results:  Recent Labs  06/15/14 1603  WBC 6.3  HGB 8.8*  HCT 26.6*  PLT 223   BMET  Recent Labs  06/15/14 1043 06/15/14 1603  NA 135 138  K 4.1 3.8  CL 102 101  CO2 24 26  GLUCOSE 92 103*  BUN 12 12  CREATININE 0.9 1.05  CALCIUM 9.2 8.9    Studies/Results: No results found.  Assessment/Plan: Preliminary Gram stain and culture results on the aspirate showed gram-positive cocci and increased number of white blood cells and PMNs. This does look to be highly suspicious for a subdural empyema and intracerebral abscess of Staphylococcus therefore I have recommended I&D of subdural fluid intracerebral abscess. I have extensively reviewed the risks and benefits of this procedure with the patient I will discuss this later on this morning with the family were planned for that later on today. He is currently on vancomycin, Rocephin and Flagyl  LOS: 1 day     Maliq Pilley P 06/16/2014, 8:29 AM

## 2014-06-16 NOTE — Anesthesia Procedure Notes (Signed)
Procedure Name: Intubation Date/Time: 06/16/2014 2:40 PM Performed by: Carola Frost Pre-anesthesia Checklist: Patient identified, Timeout performed, Emergency Drugs available, Suction available and Patient being monitored Patient Re-evaluated:Patient Re-evaluated prior to inductionOxygen Delivery Method: Circle system utilized Preoxygenation: Pre-oxygenation with 100% oxygen Intubation Type: IV induction Ventilation: Oral airway inserted - appropriate to patient size Laryngoscope Size: Mac and 3 Grade View: Grade I Tube type: Oral Tube size: 7.5 mm Number of attempts: 1 Airway Equipment and Method: Stylet Placement Confirmation: CO2 detector,  positive ETCO2,  ETT inserted through vocal cords under direct vision and breath sounds checked- equal and bilateral Secured at: 23 cm Tube secured with: Tape Dental Injury: Teeth and Oropharynx as per pre-operative assessment

## 2014-06-16 NOTE — Transfer of Care (Signed)
Immediate Anesthesia Transfer of Care Note  Patient: Alejandro [name]  Procedure(s) Performed: Procedure(s): Craniotomy for intracerebral abscess and subdural empyema (N/A)  Patient Location: PACU  Anesthesia Type:General  Level of Consciousness: awake and alert   Airway & Oxygen Therapy: Patient Spontanous Breathing and Patient connected to nasal cannula oxygen  Post-op Assessment: Report given to PACU RN, Post -op Vital signs reviewed and stable and Patient moving all extremities X 4  Post vital signs: Reviewed and stable  Complications: No apparent anesthesia complications

## 2014-06-16 NOTE — Consult Note (Signed)
PULMONARY / CRITICAL CARE MEDICINE   Name: Alejandro [name] MRN: [id number] DOB: Apr 22, 1946    ADMISSION DATE:  06/15/2014 CONSULTATION DATE:  06/16/2014  REFERRING MD :  Dr. Saintclair Halsted  CHIEF COMPLAINT:  headache  BRIEF PATIENT DESCRIPTION:  68 yo male former smoker had traumatic SAH, SDH, and ICH s/p craniectomy with bone flap.  He was noted to have confusion and headache in rehab.  MRI showed ring enhancing lesion in Rt temporal lobe concerning for abscess.  PCCM consulted to assist with medical management in ICU.  He has hx of A fib, CAD s/p CABG, systolic heart failure, and ETOH.  SIGNIFICANT EVENTS: 5/18 SAH, SDH, ICH >> Rt craniotomy 5/22 Rt craniectomy with partial temporal lobectomy 7/14 Admit, aspiration of subgaleal fluid  STUDIES:    LINES / TUBES: PIV   CULTURES: Subaleal fluid 7/14 >>   ANTIBIOTICS: Rocephin 7/14 >> Flagyl 7/14 >> Vancomycin >>   HISTORY OF PRESENT ILLNESS:   68 yo male with hx of traumatic SAH, SDH, ICH s/p craniotomy/craniectomy.  Pt went through rehab and d/c home.  His wife was concerned about him acting more confused.  He also c/o persistent headache across his forehead.  He had MRI at outside facility which showed fluid collection concerning for abscess.  He is scheduled to have surgery later today.  PCCM asked to evaluate medical status pre-op and assist with post-op medical management.  He denies double vision, sore throat, chest pain, palpitations, abdominal pain, or nausea.  He has leg swelling, but he reports this has improved since was seen by cardiology (last seen 06/08/14).  He denies cough, sputum, or dyspnea.  He has not smoked since being in hospital in May.  PAST MEDICAL HISTORY :  Past Medical History  Diagnosis Date  . Systolic heart failure   . 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 Dyslipidemia   . HTN (hypertension)   . Atrial fibrillation   . Cancer     around nose  . Tobacco abuse   . Alcohol abuse    . H/O hiatal hernia    Past Surgical History  Procedure Laterality Date  . Coronary artery bypass graft  12/24/2008    4 vessel  . Umbilical hernia repair  2010  . Hernia repair  2012  . Coronary artery bypass graft  2012  . Ventral hernia repair N/A 01/22/2013    Procedure: HERNIA REPAIR VENTRAL ADULT;  Surgeon: [name], MD;  Location: [facility];  Service: General;  Laterality: N/A;  . Application of a-cell of chest/abdomen N/A 01/22/2013    Procedure: APPLICATION OF A-CELL OF CHEST/ABDOMEN;  Surgeon: [name], MD;  Location: [facility];  Service: General;  Laterality: N/A;  . Cystoscopy N/A 01/22/2013    Procedure: Consuela Mimes;  Surgeon: [name], MD;  Location: [facility];  Service: Urology;  Laterality: N/A;  Cystoscopy with balloon dilation. Insertion of coude catheter.  . Craniotomy Right 04/19/2014    Procedure: CRANIOTOMY HEMATOMA EVACUATION SUBDURAL;  Surgeon: [name], MD;  Location: [facility];  Service: Neurosurgery;  Laterality: Right;  right  . Craniotomy Right 04/23/2014    Procedure: Craniotomy for Intracerebral Hemorrhage;  Surgeon: [name], MD;  Location: [facility];  Service: Neurosurgery;  Laterality: Right;   Prior to Admission medications   Medication Sig Start Date End Date Taking? Authorizing Provider  carvedilol (COREG) 12.5 MG tablet Take 12.5 mg by mouth  2 (two) times daily with a meal.   Yes Historical Provider, MD  FLUoxetine (PROZAC) 20 MG tablet Take 1 tablet (20 mg total) by mouth daily. 06/09/14  Yes Colon Branch, MD  folic acid (FOLVITE) 1 MG tablet Take 1 tablet (1 mg total) by mouth daily. 05/14/14  Yes Daniel J Angiulli, PA-C  furosemide (LASIX) 40 MG tablet Take 1 tablet (40 mg total) by mouth daily. 05/19/14  Yes [name], MD  levETIRAcetam (KEPPRA) 500 MG tablet Take 1 tablet (500 mg total) by mouth 2 (two) times daily. 05/14/14  Yes Daniel J Angiulli, PA-C  lisinopril (ZESTRIL) 2.5 MG tablet Take 1 tablet (2.5 mg total) by mouth daily.  06/08/14  Yes Liliane Shi, PA-C  Multiple Vitamin (MULTIVITAMIN WITH MINERALS) TABS tablet Take 1 tablet by mouth daily. 05/14/14  Yes Daniel J Angiulli, PA-C  pantoprazole (PROTONIX) 40 MG tablet Take 1 tablet (40 mg total) by mouth at bedtime. 05/14/14  Yes Daniel J Angiulli, PA-C  potassium chloride (K-DUR) 10 MEQ tablet Take 1 tablet daily 06/01/14  Yes Colon Branch, MD  simvastatin (ZOCOR) 40 MG tablet Take 1 tablet (40 mg total) by mouth every evening. 05/14/14  Yes Daniel J Angiulli, PA-C   No Known Allergies  FAMILY HISTORY:  Family History  Problem Relation Age of Onset  . Diabetes Mother   . Heart disease Mother   . Diabetes Father   . Heart disease Father   . Heart disease Brother     s/p CABG at 46  . Colon cancer Neg Hx   . Prostate cancer Neg Hx    SOCIAL HISTORY:  reports that he quit smoking about 13 months ago. His smoking use included Cigarettes. He has a 50 pack-year smoking history. He has never used smokeless tobacco. He reports that he drinks about 6 ounces of alcohol per week. He reports that he does not use illicit drugs.  REVIEW OF SYSTEMS:   Negative except above.  SUBJECTIVE:   VITAL SIGNS: Temp:  [97.4 F (36.3 C)-98.5 F (36.9 C)] 98.2 F (36.8 C) (07/15 0736) Pulse Rate:  [69-84] 80 (07/15 0736) Resp:  [18-19] 19 (07/15 0736) BP: (99-155)/(56-82) 138/63 mmHg (07/15 0736) SpO2:  [95 %-99 %] 98 % (07/15 0736) Weight:  [223 lb 12.3 oz (101.5 kg)] 223 lb 12.3 oz (101.5 kg) (07/14 1504) HEMODYNAMICS:   VENTILATOR SETTINGS:   INTAKE / OUTPUT: Intake/Output     07/14 0701 - 07/15 0700 07/15 0701 - 07/16 0700   P.O. 240    Total Intake(mL/kg) 240 (2.4)    Net +240            PHYSICAL EXAMINATION: General: no distress Neuro:  CN intact, normal strength HEENT:  Mild ptosis Rt eye, pupils reactive, no oral lesions Cardiovascular:  Regular, no murmur Lungs:  No wheeze/rales Abdomen:  Soft, non tender Musculoskeletal:  1+ ankle edema Skin:  No  rashes  LABS:  CBC  Recent Labs Lab 06/15/14 1603  WBC 6.3  HGB 8.8*  HCT 26.6*  PLT 223   Coag's  Recent Labs Lab 06/16/14 0441  INR 1.10   BMET  Recent Labs Lab 06/15/14 1043 06/15/14 1603  NA 135 138  K 4.1 3.8  CL 102 101  CO2 24 26  BUN 12 12  CREATININE 0.9 1.05  GLUCOSE 92 103*   Electrolytes  Recent Labs Lab 06/15/14 1043 06/15/14 1603  CALCIUM 9.2 8.9   Sepsis Markers No results found for this  basename: LATICACIDVEN, PROCALCITON, O2SATVEN,  in the last 168 hours ABG No results found for this basename: PHART, PCO2ART, PO2ART,  in the last 168 hours Liver Enzymes No results found for this basename: AST, ALT, ALKPHOS, BILITOT, ALBUMIN,  in the last 168 hours Cardiac Enzymes No results found for this basename: TROPONINI, PROBNP,  in the last 168 hours Glucose No results found for this basename: GLUCAP,  in the last 168 hours  Imaging No results found.   ASSESSMENT / PLAN: NEUROLOGIC A:   Abscess after craniotomy/craniectomy for traumatic SAH, SDH, ICH in May 2015. P:   To OR 7/15  PULMONARY A: Pulmonary hygiene. P:   Monitor oxygenation Bronchial hygiene  CARDIOVASCULAR A:  Hx of CAD, HTN, hyperlipidemia, chronic systolic CHF. P:  No coumadin due to hx of head bleeds Continue coreg, lasiz, lisinopril, zocor  RENAL A:   No acute issues. P:   Monitor renal fx, urine outpt, electrolytes  GASTROINTESTINAL A:   Nutrition. P:   NPO in anticipation of surgery Protonix for SUP  HEMATOLOGIC A:   Anemia of chronic disease. P:  F/u CBC SCD for DVT prevention  INFECTIOUS A:   Abscess. P:   Day 2 rocephin, flagyl, vancomycin Might need ID consult to assist with abx choice/duration  ENDOCRINE A:   No acute issues.   P:   Monitor blood sugars on BMET  Chesley Mires, MD [place] 06/16/2014, 7:48 AM Pager:  [phone] After 3pm call: [phone]

## 2014-06-17 ENCOUNTER — Encounter (HOSPITAL_COMMUNITY): Payer: Self-pay | Admitting: *Deleted

## 2014-06-17 DIAGNOSIS — I2510 Atherosclerotic heart disease of native coronary artery without angina pectoris: Secondary | ICD-10-CM

## 2014-06-17 DIAGNOSIS — I6200 Nontraumatic subdural hemorrhage, unspecified: Secondary | ICD-10-CM

## 2014-06-17 DIAGNOSIS — G062 Extradural and subdural abscess, unspecified: Secondary | ICD-10-CM

## 2014-06-17 DIAGNOSIS — Z9889 Other specified postprocedural states: Secondary | ICD-10-CM

## 2014-06-17 LAB — URINE CULTURE
CULTURE: NO GROWTH
Colony Count: NO GROWTH

## 2014-06-17 LAB — GRAM STAIN

## 2014-06-17 MED ORDER — DEXTROSE 5 % IV SOLN
2.0000 g | Freq: Three times a day (TID) | INTRAVENOUS | Status: DC
  Administered 2014-06-17 – 2014-06-22 (×15): 2 g via INTRAVENOUS
  Filled 2014-06-17 (×17): qty 2

## 2014-06-17 MED ORDER — DEXTROSE 5 % IV SOLN
1.0000 g | Freq: Three times a day (TID) | INTRAVENOUS | Status: DC
  Filled 2014-06-17 (×2): qty 1

## 2014-06-17 NOTE — Progress Notes (Signed)
PULMONARY / CRITICAL CARE MEDICINE   Name: Alejandro [name] MRN: [id number] DOB: Apr 03, 1946    ADMISSION DATE:  06/15/2014 CONSULTATION DATE:  06/16/2014  REFERRING MD :  Dr. Saintclair Halsted  CHIEF COMPLAINT:  headache  BRIEF PATIENT DESCRIPTION:  68 yo male former smoker had traumatic SAH, SDH, and ICH s/p craniectomy with bone flap.  He was noted to have confusion and headache in rehab.  MRI showed ring enhancing lesion in Rt temporal lobe concerning for abscess.  PCCM consulted to assist with medical management in ICU.  He has hx of A fib, CAD s/p CABG, systolic heart failure, and ETOH.  SIGNIFICANT EVENTS: 5/18 SAH, SDH, ICH >> Rt craniotomy 5/22 Rt craniectomy with partial temporal lobectomy 7/14 Admit, aspiration of subgaleal fluid 7/15 Evacuation of subdural empyema and intracerebral abscess  STUDIES:    LINES / TUBES: PIV  Drain Rt scalp 7/15 >>  CULTURES: Subgaleal fluid 7/14 >>  Subdural empyema 7/15 >> Cerebral abscess 7/15 >>   ANTIBIOTICS: Rocephin 7/14 >> Flagyl 7/14 >> Vancomycin >>   SUBJECTIVE:  More alert.  Mild headache.  Denies nausea, chest pain, dyspnea, abdominal pain.  VITAL SIGNS: Temp:  [97 F (36.1 C)-98.4 F (36.9 C)] 97.9 F (36.6 C) (07/16 0331) Pulse Rate:  [56-102] 80 (07/16 0756) Resp:  [12-20] 12 (07/16 0700) BP: (101-167)/(43-90) 142/70 mmHg (07/16 0756) SpO2:  [95 %-100 %] 98 % (07/16 0700) Arterial Line BP: (148-190)/(59-78) 148/60 mmHg (07/16 0700) Weight:  [225 lb 1.4 oz (102.1 kg)] 225 lb 1.4 oz (102.1 kg) (07/15 2140) INTAKE / OUTPUT: Intake/Output     07/15 0701 - 07/16 0700 07/16 0701 - 07/17 0700   P.O. 120    I.V. (mL/kg) 1368.8 (13.4) 75 (0.7)   Blood 335    IV Piggyback 450    Total Intake(mL/kg) 2273.8 (22.3) 75 (0.7)   Urine (mL/kg/hr) 2405 (1) 115 (0.8)   Drains 50 (0)    Blood 100 (0)    Total Output 2555 115   Net -281.3 -40          PHYSICAL EXAMINATION: General: no distress Neuro:  CN intact, normal  strength HEENT:  pupils reactive, no oral lesions, wound dressing clean Cardiovascular:  Regular, no murmur Lungs:  No wheeze/rales Abdomen:  Soft, non tender Musculoskeletal:  1+ ankle edema Skin:  No rashes  LABS:  CBC  Recent Labs Lab 06/15/14 1603 06/16/14 1720  WBC 6.3 6.9  HGB 8.8* 10.4*  HCT 26.6* 30.9*  PLT 223 198   Coag's  Recent Labs Lab 06/16/14 0441  INR 1.10   BMET  Recent Labs Lab 06/15/14 1043 06/15/14 1603 06/16/14 1720  NA 135 138 138  K 4.1 3.8 4.0  CL 102 101 101  CO2 24 26 21   BUN 12 12 9   CREATININE 0.9 1.05 0.77  GLUCOSE 92 103* 103*   Electrolytes  Recent Labs Lab 06/15/14 1043 06/15/14 1603 06/16/14 1720  CALCIUM 9.2 8.9 8.3*   Imaging No results found.   ASSESSMENT / PLAN: NEUROLOGIC A:   Abscess after craniotomy/craniectomy for traumatic SAH, SDH, ICH in May 2015 s/p evacuation of subdural empyema and intracerebral abscess 7/15. P:   Post-op care, AED's per neurosurgery  PULMONARY A: Pulmonary hygiene. P:   Monitor oxygenation Bronchial hygiene  CARDIOVASCULAR A:  Hx of CAD, HTN, hyperlipidemia, chronic systolic CHF. P:  No coumadin due to hx of head bleeds Continue coreg, lasiz, lisinopril, zocor  RENAL A:   No acute issues. P:  Monitor renal fx, urine outpt, electrolytes  GASTROINTESTINAL A:   Nutrition. P:   Full liquid diet >> advance per neurosurgery Protonix for SUP  HEMATOLOGIC A:   Anemia of chronic disease. P:  F/u CBC SCD for DVT prevention  INFECTIOUS A:   Subdural empyema and intracerebral abscess. P:   Day 3 rocephin, flagyl, vancomycin >> ID consulted by neurosurgery 7/16  ENDOCRINE A:   No acute issues.   P:   Monitor blood sugars on BMET  Chesley Mires, MD [place] 06/17/2014, 8:29 AM Pager:  [phone] After 3pm call: [phone]

## 2014-06-17 NOTE — Progress Notes (Signed)
Subjective: Patient reports Overall is doing better minimal headache  Objective: Vital signs in last 24 hours: Temp:  [97 F (36.1 C)-98.4 F (36.9 C)] 97.9 F (36.6 C) (07/16 0331) Pulse Rate:  [56-102] 56 (07/16 0700) Resp:  [12-20] 12 (07/16 0700) BP: (101-167)/(43-90) 101/54 mmHg (07/16 0700) SpO2:  [95 %-100 %] 98 % (07/16 0700) Arterial Line BP: (148-190)/(59-78) 148/60 mmHg (07/16 0700) Weight:  [102.1 kg (225 lb 1.4 oz)] 102.1 kg (225 lb 1.4 oz) (07/15 2140)  Intake/Output from previous day: 07/15 0701 - 07/16 0700 In: 2273.8 [P.O.:120; I.V.:1368.8; Blood:335; IV Piggyback:450] Out: 2555 [Urine:2405; Drains:50; Blood:100] Intake/Output this shift:    Awake alert oriented strength out of 5  Lab Results:  Recent Labs  06/15/14 1603 06/16/14 1720  WBC 6.3 6.9  HGB 8.8* 10.4*  HCT 26.6* 30.9*  PLT 223 198   BMET  Recent Labs  06/15/14 1603 06/16/14 1720  NA 138 138  K 3.8 4.0  CL 101 101  CO2 26 21  GLUCOSE 103* 103*  BUN 12 9  CREATININE 1.05 0.77  CALCIUM 8.9 8.3*    Studies/Results: No results found.  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

## 2014-06-17 NOTE — Evaluation (Signed)
Physical Therapy Evaluation Patient Details Name: Alejandro [name] MRN: [id number] DOB: June 07, 1946 Today's Date: 06/17/2014   History of Present Illness  Patient admitted for Postoperative brain abscess and subdural empyema                            Clinical Impression  Patient demonstrates deficits in functional mobility as indicated below. Will benefit from continued skilled PT to address deficits and maximize function. Will see as indicated and progress as tolerated.    Follow Up Recommendations Home health PT;Supervision/Assistance - 24 hour    Equipment Recommendations  None recommended by PT    Recommendations for Other Services       Precautions / Restrictions Precautions Precautions: Fall      Mobility  Bed Mobility Overal bed mobility: Needs Assistance Bed Mobility: Supine to Sit     Supine to sit: Min guard     General bed mobility comments: HOB elevated  Transfers Overall transfer level: Needs assistance Equipment used: Rolling walker (2 wheeled) Transfers: Sit to/from Stand Sit to Stand: Min assist         General transfer comment: VCs for hand placement, assist for stability performed from toilet and bed  Ambulation/Gait Ambulation/Gait assistance: Min guard Ambulation Distance (Feet): 18 Feet Assistive device: Rolling walker (2 wheeled) Gait Pattern/deviations: Step-through pattern;Trunk flexed Gait velocity: decreased      Stairs            Wheelchair Mobility    Modified Rankin (Stroke Patients Only)       Balance                                             Pertinent Vitals/Pain VSS, no pain at this time    Home Living Family/patient expects to be discharged to:: Private residence Living Arrangements: Spouse/significant other Available Help at Discharge: Family;Available 24 hours/day Type of Home: House Home Access: Stairs to enter Entrance Stairs-Rails: None Entrance Stairs-Number of Steps: 2 Home  Layout: One level Home Equipment: Clinical cytogeneticist - 2 wheels;Cane - quad Additional Comments: patient was indepdendent and worked full time in [employer], trade [employer]; patient reports he and his wife may go stay with daughter and son in Sports [employer] in [place] until he is fully recovered    Prior Function                 Hand Dominance   Dominant Hand: Right    Extremity/Trunk Assessment   Upper Extremity Assessment: Overall WFL for tasks assessed           Lower Extremity Assessment: Overall WFL for tasks assessed         Communication   Communication: No difficulties (sometimes the words he chooses are not the correct ones, but the general concept is there.)  Cognition Arousal/Alertness: Awake/alert Behavior During Therapy: WFL for tasks assessed/performed Overall Cognitive Status: Within Functional Limits for tasks assessed                      General Comments General comments (skin integrity, edema, etc.): JP drain present in skull    Exercises        Assessment/Plan    PT Assessment Patient needs continued PT services  PT Diagnosis Difficulty walking;Acute pain   PT Problem List Decreased activity tolerance;Decreased balance;Decreased mobility  PT Treatment  Interventions DME instruction;Gait training;Stair training;Functional mobility training;Therapeutic activities;Therapeutic exercise;Balance training   PT Goals (Current goals can be found in the Care Plan section) Acute Rehab PT Goals Patient Stated Goal: to go home PT Goal Formulation: With patient Time For Goal Achievement: 07/01/14 Potential to Achieve Goals: Good    Frequency Min 4X/week   Barriers to discharge        Co-evaluation               End of Session Equipment Utilized During Treatment: Gait belt Activity Tolerance: Patient tolerated treatment well Patient left: in chair;with call bell/phone within reach Nurse Communication: Mobility status         Time:  4469-5072 PT Time Calculation (min): 22 min   Charges:   PT Evaluation $Initial PT Evaluation Tier I: 1 Procedure PT Treatments $Therapeutic Activity: 8-22 mins   PT G CodesDuncan Dull 06/17/2014, 3:57 PM Alben Deeds, [place] DPT  [phone]

## 2014-06-17 NOTE — Consult Note (Signed)
[facility]    Date of Admission:  06/15/2014           Day 3 vancomycin        Day 3 ceftriaxone        Day 3 metronidazole       Reason for Consult:Postoperative brain abscess and subdural empyema    Referring Physician: Dr. Kary Kos  Active Problems:   Intracerebral mass   Intracranial abscess   . carvedilol  12.5 mg Oral BID WC  . cefTRIAXone (ROCEPHIN)  IV  2 g Intravenous Q12H  . FLUoxetine  20 mg Oral Daily  . folic acid  1 mg Oral Daily  . furosemide  40 mg Oral Daily  . levETIRAcetam  500 mg Intravenous Q12H  . lisinopril  2.5 mg Oral Daily  . metronidazole  500 mg Intravenous Q8H  . multivitamin with minerals  1 tablet Oral Daily  . pantoprazole (PROTONIX) IV  40 mg Intravenous QHS  . potassium chloride  10 mEq Oral Daily  . simvastatin  40 mg Oral QPM  . vancomycin  1,000 mg Intravenous Q12H    Recommendations: 1. Continue vancomycin and metronidazole 2. Change ceftriaxone to cefepime to broaden gram-negative rod coverage 3. Await final operative culture results   Assessment: He has developed a postoperative brain abscess and subdural empyema. I will continue broad empiric antibiotic therapy pending final cultures. He will be placement soon but I will hold off for now until I have a better idea of how many antibiotics he is likely to be on as an outpatient.    HPI: Alejandro [name] is a 68 y.o. male Who suffered a fall while on Coumadin and may. He developed a subdural hematoma and underwent a craniotomy on May 18. He underwent redo surgery on May 22 with implantation of his bone flap in his right flank. Eventually he was discharged home and seemed to be improving with the help of physical therapy. His strength and mobility was improving but recently his wife began to note some increasing confusion. He underwent a CT scan which revealed a ring enhancing fluid collection at the operative site. He was readmitted and underwent  surgery yesterday to drain a right temporal lobe abscess and subdural empyema. The initial Gram stain report was said to show gram-positive cocci in pairs but that has been corrected today to no organisms seen. As best I can tell all cultures are negative to date.   Review of Systems: Constitutional: negative for chills, fevers and sweats Eyes: negative Ears, nose, mouth, throat, and face: negative Respiratory: negative Cardiovascular: negative Gastrointestinal: negative Genitourinary:negative Neurological: positive for recent mild frontal headache and confusion, negative for coordination problems, dizziness, seizures and weakness  Past Medical History  Diagnosis Date  . Systolic heart failure   . 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 Dyslipidemia   . HTN (hypertension)   . Atrial fibrillation   . Cancer     around nose  . Tobacco abuse   . Alcohol abuse   . H/O hiatal hernia     History  Substance Use Topics  . Smoking status: Former Smoker -- 1.00 packs/day for 50 years    Types: Cigarettes    Quit date: 04/19/2013  . Smokeless tobacco: Never Used  . Alcohol Use: 6.0 oz/week    10 Cans of beer per week     Comment: 4-5  12oz beers daily    Family History  Problem Relation Age of Onset  . Diabetes Mother   . Heart disease Mother   . Diabetes Father   . Heart disease Father   . Heart disease Brother     s/p CABG at 20  . Colon cancer Neg Hx   . Prostate cancer Neg Hx    No Known Allergies  OBJECTIVE: Blood pressure 136/56, pulse 70, temperature 97.7 F (36.5 C), temperature source Oral, resp. rate 17, height 5\' 8"  (1.727 m), weight 102.1 kg (225 lb 1.4 oz), SpO2 98.00%. General: He is alert and conversant eating graham crackers in bed. His wife was present Skin: No rash Lungs: Clear Cor: Regular S1 and S2 no murmurs Abdomen: Soft and nontender. He has a healed lower midline scar. There is no redness, swelling or tenderness over his  right flank bone flap He has a clean dry gauze dressing over his right craniectomy site  Lab Results Lab Results  Component Value Date   WBC 6.9 06/16/2014   HGB 10.4* 06/16/2014   HCT 30.9* 06/16/2014   MCV 88.3 06/16/2014   PLT 198 06/16/2014    Lab Results  Component Value Date   CREATININE 0.77 06/16/2014   BUN 9 06/16/2014   NA 138 06/16/2014   K 4.0 06/16/2014   CL 101 06/16/2014   CO2 21 06/16/2014    Lab Results  Component Value Date   ALT 65* 05/05/2014   AST 27 05/05/2014   ALKPHOS 67 05/05/2014   BILITOT 0.6 05/05/2014     Microbiology: Recent Results (from the past 240 hour(s))  GRAM STAIN     Status: None   Collection Time    06/15/14  4:27 PM      Result Value Ref Range Status   Specimen Description ABSCESS   Final   Special Requests BRAIN   Final   Gram Stain     Final   Value: MODERATE WBC PRESENT,BOTH PMN AND MONONUCLEAR     FEW GRAM POSITIVE COCCI IN PAIRS   Report Status 06/15/2014 FINAL   Final  ANAEROBIC CULTURE     Status: None   Collection Time    06/15/14  4:27 PM      Result Value Ref Range Status   Specimen Description ABSCESS   Final   Special Requests FROM BRAIN   Final   Gram Stain     Final   Value: MODERATE WBC PRESENT,BOTH PMN AND MONONUCLEAR     NO SQUAMOUS EPITHELIAL CELLS SEEN     FEW GRAM POSITIVE COCCI     IN PAIRS Performed at [facility]     Performed at [facility]   Culture     Final   Value: NO ANAEROBES ISOLATED; CULTURE IN PROGRESS FOR 5 DAYS     Performed at [facility]   Report Status PENDING   Incomplete  CULTURE, ROUTINE-ABSCESS     Status: None   Collection Time    06/15/14  4:27 PM      Result Value Ref Range Status   Specimen Description ABSCESS   Final   Special Requests BRAIN   Final   Gram Stain     Final   Value: MODERATE WBC PRESENT,BOTH PMN AND MONONUCLEAR     NO SQUAMOUS EPITHELIAL CELLS SEEN     FEW GRAM POSITIVE COCCI     IN PAIRS Performed at [facility]     Performed at  [facility]   Culture     Final   Value: NO GROWTH 2 DAYS     Performed at [facility]   Report Status PENDING   Incomplete  ANAEROBIC CULTURE     Status: None   Collection Time    06/16/14  4:00 PM      Result Value Ref Range Status   Specimen Description WOUND RIGHT   Final   Special Requests HEAD PT ON VANCO ROCEPHIN CRANIOTOMY NO 2   Final   Gram Stain PENDING   Incomplete   Culture PENDING   Incomplete   Report Status PENDING   Incomplete  WOUND CULTURE     Status: None   Collection Time    06/16/14  4:00 PM      Result Value Ref Range Status   Specimen Description WOUND RIGHT   Final   Special Requests HEAD PT ON VANCO ROCEPHIN CRANIOTOMY NO 2   Final   Gram Stain     Final   Value: ABUNDANT WBC PRESENT,BOTH PMN AND MONONUCLEAR     NO SQUAMOUS EPITHELIAL CELLS SEEN     NO ORGANISMS SEEN     Performed at [facility]   Culture     Final   Value: NO GROWTH 1 DAY     Performed at [facility]   Report Status PENDING   Incomplete  GRAM STAIN     Status: None   Collection Time    06/16/14  4:00 PM      Result Value Ref Range Status   Specimen Description ABSCESS   Final   Special Requests BRAIN/CRANIOTOMY NO.2 PT ON VANCOMYCIN ROCEPHIN   Final   Gram Stain     Final   Value: ABUNDANT WBC PRESENT,BOTH PMN AND MONONUCLEAR     MODERATE GRAM POSITIVE COCCI IN PAIRS     Gram Stain Report Called to,Read Back By and Verified With: Dr Saintclair Halsted 16:50 06/16/14 ([email or web address])     NO ORGANISMS SEEN     CORRECTED REPORT CALLED TO MEGAN [facility] RN 11:30 06/17/14 ([email or web address])   Report Status 06/16/2014 FINAL   Final  WOUND CULTURE     Status: None   Collection Time    06/16/14  4:10 PM      Result Value Ref Range Status   Specimen Description WOUND   Final   Special Requests CRANIOTOMY NO 1 PT ON VANCO ROCEPHIN   Final   Gram Stain PENDING   Incomplete   Culture     Final   Value: NO GROWTH 1 DAY     Performed at [facility]   Report Status PENDING    Incomplete  GRAM STAIN     Status: None   Collection Time    06/16/14  4:10 PM      Result Value Ref Range Status   Specimen Description ABSCESS   Final   Special Requests BRAIN/CRANIOTOMY  1/PT ON VANCOMYCIN ROCEPHIN   Final   Gram Stain     Final   Value: FEW WBC PRESENT,BOTH PMN AND MONONUCLEAR     FEW GRAM POSITIVE COCCI IN PAIRS     Gram Stain Report Called to,Read Back By and Verified With: Dr Saintclair Halsted 16:50 06/16/14 ([email or web address])     NO ORGANISMS SEEN     CORRECTED REPORT CALLED TO MEGAN [facility] RN 11:30 06/17/14 ([email or web address])   Report Status 06/16/2014 FINAL   Final  MRSA PCR SCREENING     Status: None  Collection Time    06/16/14  9:43 PM      Result Value Ref Range Status   MRSA by PCR NEGATIVE  NEGATIVE Final   Comment:            The GeneXpert MRSA Assay (FDA     approved for NASAL specimens     only), is one component of a     comprehensive MRSA colonization     surveillance program. It is not     intended to diagnose MRSA     infection nor to guide or     monitor treatment for     MRSA infections.    Michel Bickers, MD [facility] [place] [facility] [phone] pager   [phone] cell 06/17/2014, 11:35 AM

## 2014-06-18 ENCOUNTER — Encounter (HOSPITAL_COMMUNITY): Payer: Self-pay | Admitting: Neurosurgery

## 2014-06-18 DIAGNOSIS — G060 Intracranial abscess and granuloma: Principal | ICD-10-CM

## 2014-06-18 LAB — BASIC METABOLIC PANEL
ANION GAP: 14 (ref 5–15)
BUN: 10 mg/dL (ref 6–23)
CALCIUM: 8.8 mg/dL (ref 8.4–10.5)
CO2: 24 mEq/L (ref 19–32)
Chloride: 100 mEq/L (ref 96–112)
Creatinine, Ser: 0.83 mg/dL (ref 0.50–1.35)
GFR calc non Af Amer: 88 mL/min — ABNORMAL LOW (ref >90)
Glucose, Bld: 154 mg/dL — ABNORMAL HIGH (ref 70–99)
POTASSIUM: 4 meq/L (ref 3.7–5.3)
Sodium: 138 mEq/L (ref 137–147)

## 2014-06-18 LAB — CBC
HCT: 31.9 % — ABNORMAL LOW (ref 39.0–52.0)
Hemoglobin: 10.5 g/dL — ABNORMAL LOW (ref 13.0–17.0)
MCH: 29 pg (ref 26.0–34.0)
MCHC: 32.9 g/dL (ref 30.0–36.0)
MCV: 88.1 fL (ref 78.0–100.0)
PLATELETS: 206 10*3/uL (ref 150–400)
RBC: 3.62 MIL/uL — AB (ref 4.22–5.81)
RDW: 16.2 % — AB (ref 11.5–15.5)
WBC: 11.4 10*3/uL — ABNORMAL HIGH (ref 4.0–10.5)

## 2014-06-18 LAB — VANCOMYCIN, TROUGH: VANCOMYCIN TR: 22.5 ug/mL — AB (ref 10.0–20.0)

## 2014-06-18 MED ORDER — VANCOMYCIN HCL IN DEXTROSE 750-5 MG/150ML-% IV SOLN
750.0000 mg | Freq: Two times a day (BID) | INTRAVENOUS | Status: DC
  Administered 2014-06-18 – 2014-06-22 (×9): 750 mg via INTRAVENOUS
  Filled 2014-06-18 (×10): qty 150

## 2014-06-18 NOTE — Evaluation (Signed)
Occupational Therapy Evaluation Patient Details Name: Alejandro [name] MRN: [id number] DOB: 17-Mar-1946 Today's Date: 06/18/2014    History of Present Illness Patient admitted for Postoperative brain abscess and subdural empyema                             Clinical Impression   Pt admitted with above. He demonstrates the below listed deficits and will benefit from continued OT to maximize safety and independence with BADLs.  Pt demonstrates mild balance deficits and generalized weakness.   Currently, he requires min guard assist for BADLs.  Anticipate he should progress quickly back to his preadmission level of functioning.  Wife supportive and can provide necessary supervision      Follow Up Recommendations  No OT follow up;Supervision/Assistance - 24 hour    Equipment Recommendations  None recommended by OT    Recommendations for Other Services       Precautions / Restrictions Precautions Precautions: Fall Precaution Comments: Wears helmet with mobility      Mobility Bed Mobility                  Transfers Overall transfer level: Needs assistance Equipment used: Rolling walker (2 wheeled) Transfers: Sit to/from [facility] Sit to Stand: Min guard Stand pivot transfers: Min guard            Balance Overall balance assessment: Needs assistance Sitting-balance support: Feet supported Sitting balance-Leahy Scale: Good     Standing balance support: Single extremity supported Standing balance-Leahy Scale: Fair                              ADL Overall ADL's : Needs assistance/impaired Eating/Feeding: Independent   Grooming: Wash/dry hands;Wash/dry face;Oral care;Min guard;Standing   Upper Body Bathing: Set up;Sitting   Lower Body Bathing: Min guard;Sit to/from stand   Upper Body Dressing : Supervision/safety;Sitting   Lower Body Dressing: Min guard;Sit to/from stand   Toilet Transfer: Min guard;Ambulation;Comfort height  toilet;RW   Toileting- Clothing Manipulation and Hygiene: Sit to/from stand;Min guard       Functional mobility during ADLs: Min guard;Rolling walker General ADL Comments: Pt requires min guard assist for balance     Vision Eye Alignment: Within Functional Limits   Ocular Range of Motion: Within Functional Limits Tracking/Visual Pursuits: Able to track stimulus in all quads without difficulty         Additional Comments: Pt reports he has been reading newspaper daily while in hospital    Perception Perception Perception Tested?: Yes   [place] tested?: Within functional limits    Pertinent Vitals/Pain Denies vision      Hand Dominance Right   Extremity/Trunk Assessment Upper Extremity Assessment Upper Extremity Assessment: Overall WFL for tasks assessed   Lower Extremity Assessment Lower Extremity Assessment: Defer to PT evaluation   Cervical / Trunk Assessment Cervical / Trunk Assessment: Normal   Communication Communication Communication: No difficulties   Cognition Arousal/Alertness: Awake/alert Behavior During Therapy: WFL for tasks assessed/performed Overall Cognitive Status: Within Functional Limits for tasks assessed (Wife confirms she feels he is at his baseline)                     General Comments       Exercises       Shoulder Instructions      Home Living Family/patient expects to be discharged to:: Private residence  Living Arrangements: Spouse/significant other Available Help at Discharge: Family;Available 24 hours/day Type of Home: House Home Access: Stairs to enter [employer] of Steps: 2 Entrance Stairs-Rails: None Home Layout: One level     Bathroom Shower/Tub: Occupational psychologist: Standard Bathroom Accessibility: Yes How Accessible: Accessible via wheelchair Home Equipment: Shower seat;Walker - 2 wheels;Cane - quad   Additional Comments: patient was indepdendent and worked full  time in [employer], [employer]; patient reports he and his wife may go stay with daughter and son in Sports [employer] in [place] until he is fully recovered      Prior Functioning/Environment Level of Independence: Needs assistance  Gait / Transfers Assistance Needed: Pt required supervision to ambulate with RW at home ADL's / Homemaking Assistance Needed: Pt and wife report that wife provided supervision during BADLs        OT Diagnosis: Generalized weakness   OT Problem List: Decreased strength;Decreased activity tolerance;Impaired balance (sitting and/or standing)   OT Treatment/Interventions: Self-care/ADL training;DME and/or AE instruction;Therapeutic activities;Balance training;Patient/family education    OT Goals(Current goals can be found in the care plan section) Acute Rehab OT Goals Patient Stated Goal: to go home OT Goal Formulation: With patient/family Time For Goal Achievement: 07/02/14 Potential to Achieve Goals: Good ADL Goals Pt Will Perform Grooming: with supervision;standing Pt Will Perform Upper Body Bathing: with supervision;sitting Pt Will Perform Lower Body Bathing: with supervision;sit to/from stand Pt Will Perform Upper Body Dressing: with supervision;sitting Pt Will Perform Lower Body Dressing: with supervision;sit to/from stand Pt Will Transfer to Toilet: with supervision;ambulating;regular height toilet;grab bars Pt Will Perform Toileting - Clothing Manipulation and hygiene: with supervision;sit to/from stand  OT Frequency: Min 2X/week   Barriers to D/C:            Co-evaluation              End of Session Equipment Utilized During Treatment: Rolling walker;Other (comment) (helmet) Nurse Communication: Mobility status  Activity Tolerance: Patient tolerated treatment well Patient left: in chair;with call bell/phone within reach;with family/visitor present   Time: 6979-4801 OT Time Calculation (min): 34 min Charges:  OT General Charges $OT Visit: 1  Procedure OT Evaluation $Initial OT Evaluation Tier I: 1 Procedure OT Treatments $Self Care/Home Management : 8-22 mins $Therapeutic Activity: 8-22 mins G-Codes:    Zarina Pe M 06/28/14, 6:45 PM

## 2014-06-18 NOTE — Progress Notes (Signed)
Subjective: Patient reports Is doing well no significant headache no fevers or chills  Objective: Vital signs in last 24 hours: Temp:  [97.5 F (36.4 C)-98.2 F (36.8 C)] 97.5 F (36.4 C) (07/17 1214) Pulse Rate:  [65-94] 90 (07/17 1200) Resp:  [11-21] 18 (07/17 1200) BP: (104-177)/(54-97) 143/54 mmHg (07/17 1200) SpO2:  [94 %-100 %] 98 % (07/17 1200)  Intake/Output from previous day: 07/16 0701 - 07/17 0700 In: 2590 [P.O.:400; I.V.:1240; IV Piggyback:950] Out: 3329 [Urine:1165; Drains:20] Intake/Output this shift: Total I/O In: 450 [I.V.:250; IV Piggyback:200] Out: -   Incision clean dry and intact awake alert and oriented neurologically intact  Lab Results:  Recent Labs  06/16/14 1720 06/18/14 0245  WBC 6.9 11.4*  HGB 10.4* 10.5*  HCT 30.9* 31.9*  PLT 198 206   BMET  Recent Labs  06/16/14 1720 06/18/14 0245  NA 138 138  K 4.0 4.0  CL 101 100  CO2 21 24  GLUCOSE 103* 154*  BUN 9 10  CREATININE 0.77 0.83  CALCIUM 8.3* 8.8    Studies/Results: No results found.  Assessment/Plan: Continued IV antibiotics for now still awaiting final culture results remains on vancomycin Rocephin and Flagyl  LOS: 3 days     Alegra Rost P 06/18/2014, 12:54 PM

## 2014-06-18 NOTE — Progress Notes (Signed)
Patient ID: Alejandro [name], male   DOB: 1946/10/23, 68 y.o.   MRN: [id number]         [facility]    Date of Admission:  06/15/2014   Total days of antibiotics 4         Active Problems:   Intracerebral mass   Intracranial abscess   . carvedilol  12.5 mg Oral BID WC  . ceFEPime (MAXIPIME) IV  2 g Intravenous 3 times per day  . FLUoxetine  20 mg Oral Daily  . folic acid  1 mg Oral Daily  . furosemide  40 mg Oral Daily  . levETIRAcetam  500 mg Intravenous Q12H  . lisinopril  2.5 mg Oral Daily  . metronidazole  500 mg Intravenous Q8H  . multivitamin with minerals  1 tablet Oral Daily  . pantoprazole (PROTONIX) IV  40 mg Intravenous QHS  . potassium chloride  10 mEq Oral Daily  . simvastatin  40 mg Oral QPM  . vancomycin  1,000 mg Intravenous Q12H    Subjective: He is feeling well and denies headache.  Objective: Temp:  [97.7 F (36.5 C)-98.2 F (36.8 C)] 98.2 F (36.8 C) (07/17 0730) Pulse Rate:  [63-94] 71 (07/17 1100) Resp:  [11-21] 16 (07/17 1100) BP: (104-177)/(56-97) 152/62 mmHg (07/17 1100) SpO2:  [94 %-100 %] 100 % (07/17 1100)  General: He is alert sitting up in his chair watching television. Skin: Clean dry gauze dressing over right temporal incision  Lab Results Lab Results  Component Value Date   WBC 11.4* 06/18/2014   HGB 10.5* 06/18/2014   HCT 31.9* 06/18/2014   MCV 88.1 06/18/2014   PLT 206 06/18/2014    Lab Results  Component Value Date   CREATININE 0.83 06/18/2014   BUN 10 06/18/2014   NA 138 06/18/2014   K 4.0 06/18/2014   CL 100 06/18/2014   CO2 24 06/18/2014    Lab Results  Component Value Date   ALT 65* 05/05/2014   AST 27 05/05/2014   ALKPHOS 67 05/05/2014   BILITOT 0.6 05/05/2014      Microbiology: Recent Results (from the past 240 hour(s))  GRAM STAIN     Status: None   Collection Time    06/15/14  4:27 PM      Result Value Ref Range Status   Specimen Description ABSCESS   Final   Special Requests BRAIN   Final   Gram Stain     Final   Value: MODERATE WBC PRESENT,BOTH PMN AND MONONUCLEAR     FEW GRAM POSITIVE COCCI IN PAIRS   Report Status 06/15/2014 FINAL   Final  ANAEROBIC CULTURE     Status: None   Collection Time    06/15/14  4:27 PM      Result Value Ref Range Status   Specimen Description ABSCESS   Final   Special Requests FROM BRAIN   Final   Gram Stain     Final   Value: MODERATE WBC PRESENT,BOTH PMN AND MONONUCLEAR     NO SQUAMOUS EPITHELIAL CELLS SEEN     FEW GRAM POSITIVE COCCI     IN PAIRS Performed at [facility]     Performed at [facility]   Culture     Final   Value: NO ANAEROBES ISOLATED; CULTURE IN PROGRESS FOR 5 DAYS     Performed at [facility]   Report Status PENDING   Incomplete  CULTURE, ROUTINE-ABSCESS  Status: None   Collection Time    06/15/14  4:27 PM      Result Value Ref Range Status   Specimen Description ABSCESS   Final   Special Requests BRAIN   Final   Gram Stain     Final   Value: MODERATE WBC PRESENT,BOTH PMN AND MONONUCLEAR     NO SQUAMOUS EPITHELIAL CELLS SEEN     FEW GRAM POSITIVE COCCI     IN PAIRS Performed at [facility]     Performed at [facility]   Culture     Final   Value: NO GROWTH 3 DAYS     Performed at [facility]   Report Status PENDING   Incomplete  URINE CULTURE     Status: None   Collection Time    06/16/14  8:25 AM      Result Value Ref Range Status   Specimen Description URINE, CLEAN CATCH   Final   Special Requests NONE   Final   Culture  Setup Time     Final   Value: 06/16/2014 13:24     Performed at [facility]     Final   Value: NO GROWTH     Performed at [facility]   Culture     Final   Value: NO GROWTH     Performed at [facility]   Report Status 06/17/2014 FINAL   Final  ANAEROBIC CULTURE     Status: None   Collection Time    06/16/14  4:00 PM      Result Value Ref Range Status   Specimen Description WOUND RIGHT    Final   Special Requests HEAD PT ON VANCO ROCEPHIN CRANIOTOMY NO 2   Final   Gram Stain PENDING   Incomplete   Culture     Final   Value: NO ANAEROBES ISOLATED; CULTURE IN PROGRESS FOR 5 DAYS     Performed at [facility]   Report Status PENDING   Incomplete  WOUND CULTURE     Status: None   Collection Time    06/16/14  4:00 PM      Result Value Ref Range Status   Specimen Description WOUND RIGHT   Final   Special Requests HEAD PT ON VANCO ROCEPHIN CRANIOTOMY NO 2   Final   Gram Stain     Final   Value: ABUNDANT WBC PRESENT,BOTH PMN AND MONONUCLEAR     NO SQUAMOUS EPITHELIAL CELLS SEEN     NO ORGANISMS SEEN     Performed at [facility]   Culture     Final   Value: NO GROWTH 2 DAYS     Performed at [facility]   Report Status PENDING   Incomplete  GRAM STAIN     Status: None   Collection Time    06/16/14  4:00 PM      Result Value Ref Range Status   Specimen Description ABSCESS   Final   Special Requests BRAIN/CRANIOTOMY NO.2 PT ON VANCOMYCIN ROCEPHIN   Final   Gram Stain     Final   Value: ABUNDANT WBC PRESENT,BOTH PMN AND MONONUCLEAR     MODERATE GRAM POSITIVE COCCI IN PAIRS     Gram Stain Report Called to,Read Back By and Verified With: Dr Saintclair Halsted 16:50 06/16/14 ([email or web address])     NO ORGANISMS SEEN     CORRECTED REPORT CALLED TO MEGAN [facility] RN 11:30 06/17/14 (  [email or web address])   Report Status 06/16/2014 FINAL   Final  ANAEROBIC CULTURE     Status: None   Collection Time    06/16/14  4:10 PM      Result Value Ref Range Status   Specimen Description WOUND   Final   Special Requests CRANIOTOMY NO 1 PT ON VANCO ROCEPHIN   Final   Gram Stain     Final   Value: FEW WBC PRESENT,BOTH PMN AND MONONUCLEAR     NO SQUAMOUS EPITHELIAL CELLS SEEN     NO ORGANISMS SEEN     Performed at [facility]   Culture     Final   Value: NO ANAEROBES ISOLATED; CULTURE IN PROGRESS FOR 5 DAYS     Performed at [facility]   Report Status PENDING   Incomplete  WOUND  CULTURE     Status: None   Collection Time    06/16/14  4:10 PM      Result Value Ref Range Status   Specimen Description WOUND   Final   Special Requests CRANIOTOMY NO 1 PT ON VANCO ROCEPHIN   Final   Gram Stain     Final   Value: FEW WBC PRESENT,BOTH PMN AND MONONUCLEAR     NO SQUAMOUS EPITHELIAL CELLS SEEN     NO ORGANISMS SEEN     Performed at [facility]   Culture     Final   Value: NO GROWTH 2 DAYS     Performed at [facility]   Report Status PENDING   Incomplete  GRAM STAIN     Status: None   Collection Time    06/16/14  4:10 PM      Result Value Ref Range Status   Specimen Description ABSCESS   Final   Special Requests BRAIN/CRANIOTOMY  1/PT ON VANCOMYCIN ROCEPHIN   Final   Gram Stain     Final   Value: FEW WBC PRESENT,BOTH PMN AND MONONUCLEAR     FEW GRAM POSITIVE COCCI IN PAIRS     Gram Stain Report Called to,Read Back By and Verified With: Dr Saintclair Halsted 16:50 06/16/14 ([email or web address])     NO ORGANISMS SEEN     CORRECTED REPORT CALLED TO MEGAN BERGMAN RN 11:30 06/17/14 ([email or web address])   Report Status 06/16/2014 FINAL   Final  MRSA PCR SCREENING     Status: None   Collection Time    06/16/14  9:43 PM      Result Value Ref Range Status   MRSA by PCR NEGATIVE  NEGATIVE Final   Comment:            The GeneXpert MRSA Assay (FDA     approved for NASAL specimens     only), is one component of a     comprehensive MRSA colonization     surveillance program. It is not     intended to diagnose MRSA     infection nor to guide or     monitor treatment for     MRSA infections.   Assessment: All cultures remained negative but the original Gram stain from pus aspirated under his flap showed gram-positive cocci in pairs. I will continue his current 3 drug regimen pending final cultures. He needs PICC placement in anticipation of at least 6 weeks of IV antibiotic therapy.  Plan: 1. Place PICC 2. Continue vancomycin, cefepime and metronidazole 3. Await final culture  results  Michel Bickers, MD [place] [facility]  [phone] pager   [phone] cell 06/18/2014, 11:28 AM

## 2014-06-18 NOTE — Progress Notes (Signed)
ANTIBIOTIC CONSULT NOTE - FOLLOW UP  Pharmacy Consult for Vancomycin Indication: Brain Abscess  No Known Allergies  Patient Measurements: Height: 5\' 8"  (172.7 cm) Weight: 225 lb 1.4 oz (102.1 kg) IBW/kg (Calculated) : 68.4  Vital Signs: Temp: 97 F (36.1 C) (07/17 1600) Temp src: Oral (07/17 1600) BP: 147/72 mmHg (07/17 1700) Pulse Rate: 75 (07/17 1700) Intake/Output from previous day: 07/16 0701 - 07/17 0700 In: 2590 [P.O.:400; I.V.:1240; IV Piggyback:950] Out: 7616 [Urine:1165; Drains:20] Intake/Output from this shift: Total I/O In: 1110 [P.O.:360; I.V.:500; IV Piggyback:250] Out: 800 [Urine:800]  Labs:  Recent Labs  06/16/14 1720 06/18/14 0245  WBC 6.9 11.4*  HGB 10.4* 10.5*  PLT 198 206  CREATININE 0.77 0.83   Estimated Creatinine Clearance: 98.7 ml/min (by C-G formula based on Cr of 0.83).  Recent Labs  06/18/14 1705  VANCOTROUGH 22.5*     Microbiology: Recent Results (from the past 720 hour(s))  GRAM STAIN     Status: None   Collection Time    06/15/14  4:27 PM      Result Value Ref Range Status   Specimen Description ABSCESS   Final   Special Requests BRAIN   Final   Gram Stain     Final   Value: MODERATE WBC PRESENT,BOTH PMN AND MONONUCLEAR     FEW GRAM POSITIVE COCCI IN PAIRS   Report Status 06/15/2014 FINAL   Final  ANAEROBIC CULTURE     Status: None   Collection Time    06/15/14  4:27 PM      Result Value Ref Range Status   Specimen Description ABSCESS   Final   Special Requests FROM BRAIN   Final   Gram Stain     Final   Value: MODERATE WBC PRESENT,BOTH PMN AND MONONUCLEAR     NO SQUAMOUS EPITHELIAL CELLS SEEN     FEW GRAM POSITIVE COCCI     IN PAIRS Performed at [facility]     Performed at [facility]   Culture     Final   Value: NO ANAEROBES ISOLATED; CULTURE IN PROGRESS FOR 5 DAYS     Performed at [facility]   Report Status PENDING   Incomplete  CULTURE, ROUTINE-ABSCESS     Status: None   Collection  Time    06/15/14  4:27 PM      Result Value Ref Range Status   Specimen Description ABSCESS   Final   Special Requests BRAIN   Final   Gram Stain     Final   Value: MODERATE WBC PRESENT,BOTH PMN AND MONONUCLEAR     NO SQUAMOUS EPITHELIAL CELLS SEEN     FEW GRAM POSITIVE COCCI     IN PAIRS Performed at [facility]     Performed at [facility]   Culture     Final   Value: NO GROWTH 3 DAYS     Performed at [facility]   Report Status PENDING   Incomplete  URINE CULTURE     Status: None   Collection Time    06/16/14  8:25 AM      Result Value Ref Range Status   Specimen Description URINE, CLEAN CATCH   Final   Special Requests NONE   Final   Culture  Setup Time     Final   Value: 06/16/2014 13:24     Performed at [place]     Final   Value: NO  GROWTH     Performed at [facility]   Culture     Final   Value: NO GROWTH     Performed at [facility]   Report Status 06/17/2014 FINAL   Final  ANAEROBIC CULTURE     Status: None   Collection Time    06/16/14  4:00 PM      Result Value Ref Range Status   Specimen Description WOUND RIGHT   Final   Special Requests HEAD PT ON VANCO ROCEPHIN CRANIOTOMY NO 2   Final   Gram Stain PENDING   Incomplete   Culture     Final   Value: NO ANAEROBES ISOLATED; CULTURE IN PROGRESS FOR 5 DAYS     Performed at [facility]   Report Status PENDING   Incomplete  WOUND CULTURE     Status: None   Collection Time    06/16/14  4:00 PM      Result Value Ref Range Status   Specimen Description WOUND RIGHT   Final   Special Requests HEAD PT ON VANCO ROCEPHIN CRANIOTOMY NO 2   Final   Gram Stain     Final   Value: ABUNDANT WBC PRESENT,BOTH PMN AND MONONUCLEAR     NO SQUAMOUS EPITHELIAL CELLS SEEN     NO ORGANISMS SEEN     Performed at [facility]   Culture     Final   Value: NO GROWTH 2 DAYS     Performed at [facility]   Report Status PENDING    Incomplete  GRAM STAIN     Status: None   Collection Time    06/16/14  4:00 PM      Result Value Ref Range Status   Specimen Description ABSCESS   Final   Special Requests BRAIN/CRANIOTOMY NO.2 PT ON VANCOMYCIN ROCEPHIN   Final   Gram Stain     Final   Value: ABUNDANT WBC PRESENT,BOTH PMN AND MONONUCLEAR     MODERATE GRAM POSITIVE COCCI IN PAIRS     Gram Stain Report Called to,Read Back By and Verified With: Dr Saintclair Halsted 16:50 06/16/14 ([email or web address])     NO ORGANISMS SEEN     CORRECTED REPORT CALLED TO MEGAN BERGMAN RN 11:30 06/17/14 ([email or web address])   Report Status 06/16/2014 FINAL   Final  ANAEROBIC CULTURE     Status: None   Collection Time    06/16/14  4:10 PM      Result Value Ref Range Status   Specimen Description WOUND   Final   Special Requests CRANIOTOMY NO 1 PT ON VANCO ROCEPHIN   Final   Gram Stain     Final   Value: FEW WBC PRESENT,BOTH PMN AND MONONUCLEAR     NO SQUAMOUS EPITHELIAL CELLS SEEN     NO ORGANISMS SEEN     Performed at [facility]   Culture     Final   Value: NO ANAEROBES ISOLATED; CULTURE IN PROGRESS FOR 5 DAYS     Performed at [facility]   Report Status PENDING   Incomplete  WOUND CULTURE     Status: None   Collection Time    06/16/14  4:10 PM      Result Value Ref Range Status   Specimen Description WOUND   Final   Special Requests CRANIOTOMY NO 1 PT ON VANCO ROCEPHIN   Final   Gram Stain     Final   Value: FEW WBC PRESENT,BOTH PMN AND MONONUCLEAR  NO SQUAMOUS EPITHELIAL CELLS SEEN     NO ORGANISMS SEEN     Performed at [facility]   Culture     Final   Value: NO GROWTH 2 DAYS     Performed at [facility]   Report Status PENDING   Incomplete  GRAM STAIN     Status: None   Collection Time    06/16/14  4:10 PM      Result Value Ref Range Status   Specimen Description ABSCESS   Final   Special Requests BRAIN/CRANIOTOMY  1/PT ON VANCOMYCIN ROCEPHIN   Final   Gram Stain     Final   Value: FEW WBC PRESENT,BOTH PMN AND  MONONUCLEAR     FEW GRAM POSITIVE COCCI IN PAIRS     Gram Stain Report Called to,Read Back By and Verified With: Dr Saintclair Halsted 16:50 06/16/14 ([email or web address])     NO ORGANISMS SEEN     CORRECTED REPORT CALLED TO MEGAN BERGMAN RN 11:30 06/17/14 ([email or web address])   Report Status 06/16/2014 FINAL   Final  MRSA PCR SCREENING     Status: None   Collection Time    06/16/14  9:43 PM      Result Value Ref Range Status   MRSA by PCR NEGATIVE  NEGATIVE Final   Comment:            The GeneXpert MRSA Assay (FDA     approved for NASAL specimens     only), is one component of a     comprehensive MRSA colonization     surveillance program. It is not     intended to diagnose MRSA     infection nor to guide or     monitor treatment for     MRSA infections.    Anti-infectives   Start     Dose/Rate Route Frequency Ordered Stop   06/17/14 1400  ceFEPIme (MAXIPIME) 1 g in dextrose 5 % 50 mL IVPB  Status:  Discontinued     1 g 100 mL/hr over 30 Minutes Intravenous 3 times per day 06/17/14 1144 06/17/14 1152   06/17/14 1400  ceFEPIme (MAXIPIME) 2 g in dextrose 5 % 50 mL IVPB     2 g 100 mL/hr over 30 Minutes Intravenous 3 times per day 06/17/14 1152     06/16/14 1542  bacitracin 50,000 Units in sodium chloride irrigation 0.9 % 500 mL irrigation  Status:  Discontinued       As needed 06/16/14 1543 06/16/14 1625   06/16/14 0600  vancomycin (VANCOCIN) IVPB 1000 mg/200 mL premix     1,000 mg 200 mL/hr over 60 Minutes Intravenous Every 12 hours 06/15/14 1646     06/15/14 1830  metroNIDAZOLE (FLAGYL) IVPB 500 mg     500 mg 100 mL/hr over 60 Minutes Intravenous Every 8 hours 06/15/14 1626     06/15/14 1800  cefTRIAXone (ROCEPHIN) 2 g in dextrose 5 % 50 mL IVPB  Status:  Discontinued     2 g 100 mL/hr over 30 Minutes Intravenous Every 12 hours 06/15/14 1626 06/17/14 1144   06/15/14 1700  vancomycin (VANCOCIN) 2,000 mg in sodium chloride 0.9 % 500 mL IVPB     2,000 mg 250 mL/hr over 120 Minutes Intravenous  Once 06/15/14 1646  06/15/14 2013      Assessment: 10 YOM with brain abscess after craniotomy/craniectomy, on vanc, cefepime, metronidazole. Vancomycin trough slightly supratherapeutic = 22.5. Drawn 30 minutes earlier, true trough should still be slightly above 20,  Renal function stable.   Vanc 7/14>> Ceftriaxone 7/14>>7/16 Metronidazole 7/14>> Cefepime 7/16>>  7/14 gram stain: few GP cocci in pairs 7/14 abcess: pending 7/15 wound x 2: pending 7/15 anaerobic: pending 7/15 urine: neg  Goal of Therapy:  Vancomycin trough level 15-20 mcg/ml  Plan:  - Change vancomycin to 750 mg IV Q 12 hrs - F/u renal function and culture  Maryanna Shape, PharmD, BCPS  Clinical Pharmacist  Pager: [phone]   06/18/2014,6:10 PM

## 2014-06-18 NOTE — Progress Notes (Signed)
Physical Therapy Treatment Patient Details Name: JADARRIUS MASELLI MRN: [id number] DOB: 1946-09-21 Today's Date: 2014-06-30    History of Present Illness Patient admitted for Postoperative brain abscess and subdural empyema                              PT Comments    Patient making gains with mobility/gait.  Follow Up Recommendations  Home health PT;Supervision/Assistance - 24 hour     Equipment Recommendations  None recommended by PT    Recommendations for Other Services       Precautions / Restrictions Precautions Precautions: Fall Precaution Comments: Wears helmet with mobility Restrictions Weight Bearing Restrictions: No    Mobility  Bed Mobility                  Transfers Overall transfer level: Needs assistance Equipment used: Rolling walker (2 wheeled) Transfers: Sit to/from Stand Sit to Stand: Min assist         General transfer comment: Verbal cues for hand placement.  Assist to rise to standing from recliner and toilet.  Assist for stability.  Ambulation/Gait Ambulation/Gait assistance: Min guard Ambulation Distance (Feet): 110 Feet Assistive device: Rolling walker (2 wheeled) Gait Pattern/deviations: Step-through pattern;Decreased step length - right;Decreased step length - left;Decreased stride length;Trunk flexed Gait velocity: decreased Gait velocity interpretation: Below normal speed for age/gender General Gait Details: Verbal cues for safe use of RW.  Cues to stand upright.   Stairs            Wheelchair Mobility    Modified Rankin (Stroke Patients Only)       Balance                                    Cognition Arousal/Alertness: Awake/alert Behavior During Therapy: WFL for tasks assessed/performed Overall Cognitive Status: Within Functional Limits for tasks assessed                      Exercises General Exercises - Lower Extremity Ankle Circles/Pumps: AROM;Both;10 reps;Seated    General  Comments General comments (skin integrity, edema, etc.): JP drain      Pertinent Vitals/Pain     Home Living                      Prior Function            PT Goals (current goals can now be found in the care plan section) Progress towards PT goals: Progressing toward goals    Frequency  Min 4X/week    PT Plan Current plan remains appropriate    Co-evaluation             End of Session Equipment Utilized During Treatment: Gait belt (Helmet) Activity Tolerance: Patient tolerated treatment well Patient left: in chair;with call bell/phone within reach;with family/visitor present     Time: 1137-1205 PT Time Calculation (min): 28 min  Charges:  $Gait Training: 23-37 mins                    G Codes:      Despina Pole 06/30/2014, 12:58 PM Carita Pian. Sanjuana Kava, [place] Pager [phone]

## 2014-06-18 NOTE — Progress Notes (Signed)
Pt assist x1 with FWW for BRP - helmet utilized appropriately when pt up and out of bed.  Pt currently up in the chair, BLE elevated, no c/o pain or discomfort, watching tv.  Will continue to closely monitor.

## 2014-06-18 NOTE — Progress Notes (Signed)
PULMONARY / CRITICAL CARE MEDICINE   Name: Alejandro [name] MRN: [id number] DOB: July 09, 1946    ADMISSION DATE:  06/15/2014 CONSULTATION DATE:  06/16/2014  REFERRING MD :  Dr. Saintclair Halsted  CHIEF COMPLAINT:  headache  BRIEF PATIENT DESCRIPTION:  68 yo male former smoker had traumatic SAH, SDH, and ICH s/p craniectomy with bone flap.  He was noted to have confusion and headache in rehab.  MRI showed ring enhancing lesion in Rt temporal lobe concerning for abscess.  PCCM consulted to assist with medical management in ICU.  He has hx of A fib, CAD s/p CABG, systolic heart failure, and ETOH.  SIGNIFICANT EVENTS: 5/18 SAH, SDH, ICH >> Rt craniotomy 5/22 Rt craniectomy with partial temporal lobectomy 7/14 Admit, aspiration of subgaleal fluid 7/15 Evacuation of subdural empyema and intracerebral abscess  STUDIES:    LINES / TUBES: PIV  Drain Rt scalp 7/15 >>  CULTURES: Subgaleal fluid 7/14 >> Subdural empyema 7/15 >> Cerebral abscess 7/15 >>   ANTIBIOTICS: Rocephin 7/14 >> 7/16 Flagyl 7/14 >> Vancomycin >>  Cefepime 7/16 >>  SUBJECTIVE:  Feels better.  Denies chest pain.  Tolerating diet.  VITAL SIGNS: Temp:  [97.7 F (36.5 C)-98.2 F (36.8 C)] 98.2 F (36.8 C) (07/17 0730) Pulse Rate:  [57-94] 94 (07/17 0730) Resp:  [11-21] 18 (07/17 0730) BP: (104-177)/(56-97) 146/76 mmHg (07/17 0700) SpO2:  [92 %-100 %] 100 % (07/17 0730) INTAKE / OUTPUT: Intake/Output     07/16 0701 - 07/17 0700 07/17 0701 - 07/18 0700   P.O. 400    I.V. (mL/kg) 1240 (12.1)    Blood     IV Piggyback 950    Total Intake(mL/kg) 2590 (25.4)    Urine (mL/kg/hr) 1165 (0.5)    Drains 20 (0)    Blood     Total Output 1185     Net +1405          Urine Occurrence 1 x 1 x   Stool Occurrence 1 x 1 x     PHYSICAL EXAMINATION: General: no distress Neuro:  CN intact, normal strength HEENT:  pupils reactive, no oral lesions, wound dressing clean Cardiovascular:  Regular, no murmur Lungs:  No  wheeze/rales Abdomen:  Soft, non tender Musculoskeletal: no edema Skin:  No rashes  LABS:  CBC  Recent Labs Lab 06/15/14 1603 06/16/14 1720 06/18/14 0245  WBC 6.3 6.9 11.4*  HGB 8.8* 10.4* 10.5*  HCT 26.6* 30.9* 31.9*  PLT 223 198 206   Coag's  Recent Labs Lab 06/16/14 0441  INR 1.10   BMET  Recent Labs Lab 06/15/14 1603 06/16/14 1720 06/18/14 0245  NA 138 138 138  K 3.8 4.0 4.0  CL 101 101 100  CO2 26 21 24   BUN 12 9 10   CREATININE 1.05 0.77 0.83  GLUCOSE 103* 103* 154*   Electrolytes  Recent Labs Lab 06/15/14 1603 06/16/14 1720 06/18/14 0245  CALCIUM 8.9 8.3* 8.8   Imaging No results found.   ASSESSMENT / PLAN: NEUROLOGIC A:   Abscess after craniotomy/craniectomy for traumatic SAH, SDH, ICH in May 2015 s/p evacuation of subdural empyema and intracerebral abscess 7/15. P:   Post-op care, AED's per neurosurgery  PULMONARY A: Pulmonary hygiene. P:   Monitor oxygenation Bronchial hygiene  CARDIOVASCULAR A:  Hx of CAD, HTN, hyperlipidemia, chronic systolic CHF, PAF. P:  No coumadin due to hx of head bleeds Continue coreg, lasiz, lisinopril, zocor  RENAL A:   No acute issues. P:   Monitor renal fx, urine outpt,  electrolytes  GASTROINTESTINAL A:   Nutrition. P:   Heart healthy diet Protonix for SUP  HEMATOLOGIC A:   Anemia of chronic disease. P:  F/u CBC SCD for DVT prevention  INFECTIOUS A:   Subdural empyema and intracerebral abscess. P:   Day 4 of Abx, currently on cefepime/flagyl/vancomycin per ID Might need PICC line for long term Abx >> defer to ID and primary team  ENDOCRINE A:   No acute issues.   P:   Monitor blood sugars on BMET  PCCM can be available as needed over weekend >> please call for assistance.  If he remains in ICU, then PCCM will f/u on Monday 7/20.  If pt transferred out of ICU over weekend, then please consult [facility] for further medical management.  Chesley Mires, MD [facility]  Pulmonary/Critical Care 06/18/2014, 8:56 AM Pager:  [phone] After 3pm call: [phone]

## 2014-06-19 LAB — CULTURE, ROUTINE-ABSCESS: CULTURE: NO GROWTH

## 2014-06-19 LAB — WOUND CULTURE
Culture: NO GROWTH
Culture: NO GROWTH

## 2014-06-19 MED ORDER — LEVETIRACETAM 500 MG PO TABS
500.0000 mg | ORAL_TABLET | Freq: Two times a day (BID) | ORAL | Status: DC
  Administered 2014-06-19 – 2014-06-22 (×6): 500 mg via ORAL
  Filled 2014-06-19 (×8): qty 1

## 2014-06-19 MED ORDER — PANTOPRAZOLE SODIUM 40 MG PO TBEC
40.0000 mg | DELAYED_RELEASE_TABLET | Freq: Every day | ORAL | Status: DC
  Administered 2014-06-19 – 2014-06-21 (×3): 40 mg via ORAL
  Filled 2014-06-19 (×3): qty 1

## 2014-06-19 NOTE — Progress Notes (Signed)
Patient ID: Alejandro [name], male   DOB: [birth year], 68 y.o.   MRN: [id number]         [facility] for Infectious Disease    Date of Admission:  06/15/2014   Total days of antibiotics 5         Active Problems:   Intracerebral mass   Intracranial abscess   . carvedilol  12.5 mg Oral BID WC  . ceFEPime (MAXIPIME) IV  2 g Intravenous 3 times per day  . FLUoxetine  20 mg Oral Daily  . folic acid  1 mg Oral Daily  . furosemide  40 mg Oral Daily  . levETIRAcetam  500 mg Oral BID  . lisinopril  2.5 mg Oral Daily  . metronidazole  500 mg Intravenous Q8H  . multivitamin with minerals  1 tablet Oral Daily  . pantoprazole  40 mg Oral QHS  . potassium chloride  10 mEq Oral Daily  . simvastatin  40 mg Oral QPM  . vancomycin  750 mg Intravenous Q12H    Subjective: Afebrile, drain removed by dr. Ellene Route   Objective: Temp:  [97 F (36.1 C)-98.5 F (36.9 C)] 97.3 F (36.3 C) (07/18 1152) Pulse Rate:  [70-108] 95 (07/18 1145) Resp:  [11-21] 18 (07/18 1303) BP: (117-156)/(59-90) 130/74 mmHg (07/18 1303) SpO2:  [92 %-100 %] 99 % (07/18 1145)  General: He is alert sitting up in his chair watching television. Skin: Clean dry gauze dressing over right temporal incision  Lab Results Lab Results  Component Value Date   WBC 11.4* 06/18/2014   HGB 10.5* 06/18/2014   HCT 31.9* 06/18/2014   MCV 88.1 06/18/2014   PLT 206 06/18/2014    Lab Results  Component Value Date   CREATININE 0.83 06/18/2014   BUN 10 06/18/2014   NA 138 06/18/2014   K 4.0 06/18/2014   CL 100 06/18/2014   CO2 24 06/18/2014    Lab Results  Component Value Date   ALT 65* 05/05/2014   AST 27 05/05/2014   ALKPHOS 67 05/05/2014   BILITOT 0.6 05/05/2014      Microbiology: Recent Results (from the past 240 hour(s))  GRAM STAIN     Status: None   Collection Time    06/15/14  4:27 PM      Result Value Ref Range Status   Specimen Description ABSCESS   Final   Special Requests BRAIN   Final   Gram Stain     Final   Value:  MODERATE WBC PRESENT,BOTH PMN AND MONONUCLEAR     FEW GRAM POSITIVE COCCI IN PAIRS   Report Status 06/15/2014 FINAL   Final  ANAEROBIC CULTURE     Status: None   Collection Time    06/15/14  4:27 PM      Result Value Ref Range Status   Specimen Description ABSCESS   Final   Special Requests FROM BRAIN   Final   Gram Stain     Final   Value: MODERATE WBC PRESENT,BOTH PMN AND MONONUCLEAR     NO SQUAMOUS EPITHELIAL CELLS SEEN     FEW GRAM POSITIVE COCCI     IN PAIRS Performed at [facility]     Performed at [facility]   Culture     Final   Value: NO ANAEROBES ISOLATED; CULTURE IN PROGRESS FOR 5 DAYS     Performed at [facility]   Report Status PENDING   Incomplete  CULTURE, ROUTINE-ABSCESS     Status:  None   Collection Time    06/15/14  4:27 PM      Result Value Ref Range Status   Specimen Description ABSCESS   Final   Special Requests BRAIN   Final   Gram Stain     Final   Value: MODERATE WBC PRESENT,BOTH PMN AND MONONUCLEAR     NO SQUAMOUS EPITHELIAL CELLS SEEN     FEW GRAM POSITIVE COCCI     IN PAIRS Performed at [facility]     Performed at [facility]   Culture     Final   Value: NO GROWTH 3 DAYS     Performed at [facility]   Report Status 06/19/2014 FINAL   Final  URINE CULTURE     Status: None   Collection Time    06/16/14  8:25 AM      Result Value Ref Range Status   Specimen Description URINE, CLEAN CATCH   Final   Special Requests NONE   Final   Culture  Setup Time     Final   Value: 06/16/2014 13:24     Performed at [facility]     Final   Value: NO GROWTH     Performed at [facility]   Culture     Final   Value: NO GROWTH     Performed at [facility]   Report Status 06/17/2014 FINAL   Final  ANAEROBIC CULTURE     Status: None   Collection Time    06/16/14  4:00 PM      Result Value Ref Range Status   Specimen Description WOUND RIGHT   Final   Special  Requests HEAD PT ON VANCO ROCEPHIN CRANIOTOMY NO 2   Final   Gram Stain PENDING   Incomplete   Culture     Final   Value: NO ANAEROBES ISOLATED; CULTURE IN PROGRESS FOR 5 DAYS     Performed at [facility]   Report Status PENDING   Incomplete  WOUND CULTURE     Status: None   Collection Time    06/16/14  4:00 PM      Result Value Ref Range Status   Specimen Description WOUND RIGHT   Final   Special Requests HEAD PT ON VANCO ROCEPHIN CRANIOTOMY NO 2   Final   Gram Stain     Final   Value: ABUNDANT WBC PRESENT,BOTH PMN AND MONONUCLEAR     NO SQUAMOUS EPITHELIAL CELLS SEEN     NO ORGANISMS SEEN     Performed at [facility]   Culture     Final   Value: NO GROWTH 2 DAYS     Performed at [facility]   Report Status 06/19/2014 FINAL   Final  GRAM STAIN     Status: None   Collection Time    06/16/14  4:00 PM      Result Value Ref Range Status   Specimen Description ABSCESS   Final   Special Requests BRAIN/CRANIOTOMY NO.2 PT ON VANCOMYCIN ROCEPHIN   Final   Gram Stain     Final   Value: ABUNDANT WBC PRESENT,BOTH PMN AND MONONUCLEAR     MODERATE GRAM POSITIVE COCCI IN PAIRS     Gram Stain Report Called to,Read Back By and Verified With: Dr Saintclair Halsted 16:50 06/16/14 ([email or web address])     NO ORGANISMS SEEN     CORRECTED REPORT CALLED TO MEGAN [facility] RN 11:30  06/17/14 ([email or web address])   Report Status 06/16/2014 FINAL   Final  ANAEROBIC CULTURE     Status: None   Collection Time    06/16/14  4:10 PM      Result Value Ref Range Status   Specimen Description WOUND   Final   Special Requests CRANIOTOMY NO 1 PT ON VANCO ROCEPHIN   Final   Gram Stain     Final   Value: FEW WBC PRESENT,BOTH PMN AND MONONUCLEAR     NO SQUAMOUS EPITHELIAL CELLS SEEN     NO ORGANISMS SEEN     Performed at [facility]   Culture     Final   Value: NO ANAEROBES ISOLATED; CULTURE IN PROGRESS FOR 5 DAYS     Performed at [facility]   Report Status PENDING   Incomplete  WOUND CULTURE      Status: None   Collection Time    06/16/14  4:10 PM      Result Value Ref Range Status   Specimen Description WOUND   Final   Special Requests CRANIOTOMY NO 1 PT ON VANCO ROCEPHIN   Final   Gram Stain     Final   Value: FEW WBC PRESENT,BOTH PMN AND MONONUCLEAR     NO SQUAMOUS EPITHELIAL CELLS SEEN     NO ORGANISMS SEEN     Performed at [facility]   Culture     Final   Value: NO GROWTH 2 DAYS     Performed at [facility]   Report Status 06/19/2014 FINAL   Final  GRAM STAIN     Status: None   Collection Time    06/16/14  4:10 PM      Result Value Ref Range Status   Specimen Description ABSCESS   Final   Special Requests BRAIN/CRANIOTOMY  1/PT ON VANCOMYCIN ROCEPHIN   Final   Gram Stain     Final   Value: FEW WBC PRESENT,BOTH PMN AND MONONUCLEAR     FEW GRAM POSITIVE COCCI IN PAIRS     Gram Stain Report Called to,Read Back By and Verified With: Dr Saintclair Halsted 16:50 06/16/14 ([email or web address])     NO ORGANISMS SEEN     CORRECTED REPORT CALLED TO MEGAN BERGMAN RN 11:30 06/17/14 ([email or web address])   Report Status 06/16/2014 FINAL   Final  MRSA PCR SCREENING     Status: None   Collection Time    06/16/14  9:43 PM      Result Value Ref Range Status   MRSA by PCR NEGATIVE  NEGATIVE Final   Comment:            The GeneXpert MRSA Assay (FDA     approved for NASAL specimens     only), is one component of a     comprehensive MRSA colonization     surveillance program. It is not     intended to diagnose MRSA     infection nor to guide or     monitor treatment for     MRSA infections.   Assessment: All cultures remained negative but the original Gram stain showed gram-positive cocci in pairs. continue with 3 drug regimen pending final cultures. He needs PICC placement in anticipation of at least 6 weeks of IV antibiotic therapy.  Plan: 1. Will place order to get PICC line 2. Continue vancomycin, cefepime and metronidazole 3. Await final culture results, if ngtd -> will likely continue on  his current regimen  Carlyle Basques, MD [facility] [place] [facility] [phone] pager   [phone] cell 06/19/2014, 3:17 PM

## 2014-06-19 NOTE — Progress Notes (Signed)
Patient ID: Alejandro [name], male   DOB: 09/14/1946, 68 y.o.   MRN: [id number] Alert and oriented. Neurologically stable. Drain removed as it had been drawing air and not maintaining suction. Scalp is clean and dry with flap being well depressed. Neurosurgically appears stable Mobilizing Observe

## 2014-06-20 ENCOUNTER — Inpatient Hospital Stay (HOSPITAL_COMMUNITY): Payer: BC Managed Care – PPO

## 2014-06-20 DIAGNOSIS — Z5181 Encounter for therapeutic drug level monitoring: Secondary | ICD-10-CM

## 2014-06-20 LAB — BASIC METABOLIC PANEL
Anion gap: 17 — ABNORMAL HIGH (ref 5–15)
BUN: 6 mg/dL (ref 6–23)
CHLORIDE: 98 meq/L (ref 96–112)
CO2: 24 mEq/L (ref 19–32)
Calcium: 9.2 mg/dL (ref 8.4–10.5)
Creatinine, Ser: 0.82 mg/dL (ref 0.50–1.35)
GFR calc non Af Amer: 89 mL/min — ABNORMAL LOW (ref >90)
Glucose, Bld: 122 mg/dL — ABNORMAL HIGH (ref 70–99)
Potassium: 3.4 mEq/L — ABNORMAL LOW (ref 3.7–5.3)
Sodium: 139 mEq/L (ref 137–147)

## 2014-06-20 LAB — TYPE AND SCREEN
ABO/RH(D): O NEG
Antibody Screen: NEGATIVE
UNIT DIVISION: 0
Unit division: 0

## 2014-06-20 LAB — CBC WITH DIFFERENTIAL/PLATELET
BASOS PCT: 0 % (ref 0–1)
Basophils Absolute: 0 10*3/uL (ref 0.0–0.1)
Eosinophils Absolute: 0.1 10*3/uL (ref 0.0–0.7)
Eosinophils Relative: 1 % (ref 0–5)
HCT: 31.5 % — ABNORMAL LOW (ref 39.0–52.0)
HEMOGLOBIN: 10.5 g/dL — AB (ref 13.0–17.0)
LYMPHS ABS: 1.5 10*3/uL (ref 0.7–4.0)
Lymphocytes Relative: 17 % (ref 12–46)
MCH: 28.8 pg (ref 26.0–34.0)
MCHC: 33.3 g/dL (ref 30.0–36.0)
MCV: 86.3 fL (ref 78.0–100.0)
MONOS PCT: 9 % (ref 3–12)
Monocytes Absolute: 0.8 10*3/uL (ref 0.1–1.0)
NEUTROS ABS: 6.5 10*3/uL (ref 1.7–7.7)
NEUTROS PCT: 73 % (ref 43–77)
Platelets: 171 10*3/uL (ref 150–400)
RBC: 3.65 MIL/uL — AB (ref 4.22–5.81)
RDW: 15.9 % — ABNORMAL HIGH (ref 11.5–15.5)
WBC: 8.8 10*3/uL (ref 4.0–10.5)

## 2014-06-20 LAB — ANAEROBIC CULTURE

## 2014-06-20 MED ORDER — SODIUM CHLORIDE 0.9 % IJ SOLN
10.0000 mL | INTRAMUSCULAR | Status: DC | PRN
  Administered 2014-06-22 (×2): 10 mL

## 2014-06-20 MED ORDER — SODIUM CHLORIDE 0.9 % IJ SOLN
10.0000 mL | Freq: Two times a day (BID) | INTRAMUSCULAR | Status: DC
  Administered 2014-06-20: 20 mL
  Administered 2014-06-20 – 2014-06-21 (×3): 10 mL

## 2014-06-20 NOTE — Progress Notes (Signed)
Patient ID: Alejandro [name], male   DOB: November 07, 1946, 68 y.o.   MRN: [id number]         [facility] for Infectious Disease    Date of Admission:  06/15/2014   Total days of antibiotics 6         Active Problems:   Intracerebral mass   Intracranial abscess   . carvedilol  12.5 mg Oral BID WC  . ceFEPime (MAXIPIME) IV  2 g Intravenous 3 times per day  . FLUoxetine  20 mg Oral Daily  . folic acid  1 mg Oral Daily  . furosemide  40 mg Oral Daily  . levETIRAcetam  500 mg Oral BID  . lisinopril  2.5 mg Oral Daily  . metronidazole  500 mg Intravenous Q8H  . multivitamin with minerals  1 tablet Oral Daily  . pantoprazole  40 mg Oral QHS  . potassium chloride  10 mEq Oral Daily  . simvastatin  40 mg Oral QPM  . sodium chloride  10-40 mL Intracatheter Q12H  . vancomycin  750 mg Intravenous Q12H    Subjective: Afebrile, received picc line. Spent much time up in chair. Had 2 loose stools today  Objective: Temp:  [97.6 F (36.4 C)-98.3 F (36.8 C)] 98.3 F (36.8 C) (07/19 1154) Pulse Rate:  [93-94] 94 (07/19 0400) Resp:  [14-20] 20 (07/19 1112) BP: (110-157)/(62-94) 157/94 mmHg (07/19 1112) SpO2:  [95 %-96 %] 96 % (07/19 0800)  General: He is alert lying in bed no difficulty with turning around to speak with me. Skin: Clean dry gauze dressing over right temporal incision Pulm: ctab no w/c/r Cors: nl s1,s2, no g/mr/  Lab Results Lab Results  Component Value Date   WBC 11.4* 06/18/2014   HGB 10.5* 06/18/2014   HCT 31.9* 06/18/2014   MCV 88.1 06/18/2014   PLT 206 06/18/2014    Lab Results  Component Value Date   CREATININE 0.83 06/18/2014   BUN 10 06/18/2014   NA 138 06/18/2014   K 4.0 06/18/2014   CL 100 06/18/2014   CO2 24 06/18/2014    Lab Results  Component Value Date   ALT 65* 05/05/2014   AST 27 05/05/2014   ALKPHOS 67 05/05/2014   BILITOT 0.6 05/05/2014      Microbiology: Recent Results (from the past 240 hour(s))  GRAM STAIN     Status: None   Collection Time   06/15/14  4:27 PM      Result Value Ref Range Status   Specimen Description ABSCESS   Final   Special Requests BRAIN   Final   Gram Stain     Final   Value: MODERATE WBC PRESENT,BOTH PMN AND MONONUCLEAR     FEW GRAM POSITIVE COCCI IN PAIRS   Report Status 06/15/2014 FINAL   Final  ANAEROBIC CULTURE     Status: None   Collection Time    06/15/14  4:27 PM      Result Value Ref Range Status   Specimen Description ABSCESS   Final   Special Requests FROM BRAIN   Final   Gram Stain     Final   Value: MODERATE WBC PRESENT,BOTH PMN AND MONONUCLEAR     NO SQUAMOUS EPITHELIAL CELLS SEEN     FEW GRAM POSITIVE COCCI     IN PAIRS Performed at [facility]     Performed at [facility]   Culture     Final   Value: NO ANAEROBES ISOLATED  Performed at [facility]   Report Status 06/20/2014 FINAL   Final  CULTURE, ROUTINE-ABSCESS     Status: None   Collection Time    06/15/14  4:27 PM      Result Value Ref Range Status   Specimen Description ABSCESS   Final   Special Requests BRAIN   Final   Gram Stain     Final   Value: MODERATE WBC PRESENT,BOTH PMN AND MONONUCLEAR     NO SQUAMOUS EPITHELIAL CELLS SEEN     FEW GRAM POSITIVE COCCI     IN PAIRS Performed at [facility]     Performed at [facility]   Culture     Final   Value: NO GROWTH 3 DAYS     Performed at [facility]   Report Status 06/19/2014 FINAL   Final  URINE CULTURE     Status: None   Collection Time    06/16/14  8:25 AM      Result Value Ref Range Status   Specimen Description URINE, CLEAN CATCH   Final   Special Requests NONE   Final   Culture  Setup Time     Final   Value: 06/16/2014 13:24     Performed at [place]     Final   Value: NO GROWTH     Performed at [facility]   Culture     Final   Value: NO GROWTH     Performed at [facility]   Report Status 06/17/2014 FINAL   Final  ANAEROBIC CULTURE     Status: None    Collection Time    06/16/14  4:00 PM      Result Value Ref Range Status   Specimen Description WOUND RIGHT   Final   Special Requests HEAD PT ON VANCO ROCEPHIN CRANIOTOMY NO 2   Final   Gram Stain PENDING   Incomplete   Culture     Final   Value: NO ANAEROBES ISOLATED; CULTURE IN PROGRESS FOR 5 DAYS     Performed at [facility]   Report Status PENDING   Incomplete  WOUND CULTURE     Status: None   Collection Time    06/16/14  4:00 PM      Result Value Ref Range Status   Specimen Description WOUND RIGHT   Final   Special Requests HEAD PT ON VANCO ROCEPHIN CRANIOTOMY NO 2   Final   Gram Stain     Final   Value: ABUNDANT WBC PRESENT,BOTH PMN AND MONONUCLEAR     NO SQUAMOUS EPITHELIAL CELLS SEEN     NO ORGANISMS SEEN     Performed at [facility]   Culture     Final   Value: NO GROWTH 2 DAYS     Performed at [facility]   Report Status 06/19/2014 FINAL   Final  GRAM STAIN     Status: None   Collection Time    06/16/14  4:00 PM      Result Value Ref Range Status   Specimen Description ABSCESS   Final   Special Requests BRAIN/CRANIOTOMY NO.2 PT ON VANCOMYCIN ROCEPHIN   Final   Gram Stain     Final   Value: ABUNDANT WBC PRESENT,BOTH PMN AND MONONUCLEAR     MODERATE GRAM POSITIVE COCCI IN PAIRS     Gram Stain Report Called to,Read Back By and Verified With: Dr Saintclair Halsted  16:50 06/16/14 ([email or web address])     NO ORGANISMS SEEN     CORRECTED REPORT CALLED TO MEGAN BERGMAN RN 11:30 06/17/14 ([email or web address])   Report Status 06/16/2014 FINAL   Final  ANAEROBIC CULTURE     Status: None   Collection Time    06/16/14  4:10 PM      Result Value Ref Range Status   Specimen Description WOUND   Final   Special Requests CRANIOTOMY NO 1 PT ON VANCO ROCEPHIN   Final   Gram Stain     Final   Value: FEW WBC PRESENT,BOTH PMN AND MONONUCLEAR     NO SQUAMOUS EPITHELIAL CELLS SEEN     NO ORGANISMS SEEN     Performed at [facility]   Culture     Final   Value: NO ANAEROBES ISOLATED;  CULTURE IN PROGRESS FOR 5 DAYS     Performed at [facility]   Report Status PENDING   Incomplete  WOUND CULTURE     Status: None   Collection Time    06/16/14  4:10 PM      Result Value Ref Range Status   Specimen Description WOUND   Final   Special Requests CRANIOTOMY NO 1 PT ON VANCO ROCEPHIN   Final   Gram Stain     Final   Value: FEW WBC PRESENT,BOTH PMN AND MONONUCLEAR     NO SQUAMOUS EPITHELIAL CELLS SEEN     NO ORGANISMS SEEN     Performed at [facility]   Culture     Final   Value: NO GROWTH 2 DAYS     Performed at [facility]   Report Status 06/19/2014 FINAL   Final  GRAM STAIN     Status: None   Collection Time    06/16/14  4:10 PM      Result Value Ref Range Status   Specimen Description ABSCESS   Final   Special Requests BRAIN/CRANIOTOMY  1/PT ON VANCOMYCIN ROCEPHIN   Final   Gram Stain     Final   Value: FEW WBC PRESENT,BOTH PMN AND MONONUCLEAR     FEW GRAM POSITIVE COCCI IN PAIRS     Gram Stain Report Called to,Read Back By and Verified With: Dr Saintclair Halsted 16:50 06/16/14 ([email or web address])     NO ORGANISMS SEEN     CORRECTED REPORT CALLED TO MEGAN BERGMAN RN 11:30 06/17/14 ([email or web address])   Report Status 06/16/2014 FINAL   Final  MRSA PCR SCREENING     Status: None   Collection Time    06/16/14  9:43 PM      Result Value Ref Range Status   MRSA by PCR NEGATIVE  NEGATIVE Final   Comment:            The GeneXpert MRSA Assay (FDA     approved for NASAL specimens     only), is one component of a     comprehensive MRSA colonization     surveillance program. It is not     intended to diagnose MRSA     infection nor to guide or     monitor treatment for     MRSA infections.   Assessment: All cultures remained negative but the original Gram stain showed gram-positive cocci in pairs. continue with 3 drug regimen pending final cultures. He needs PICC placement in anticipation of at least 6 weeks of IV antibiotic therapy.  Plan: 1. supratherapeutic  vancomycin level = will check cbc and bmp today, up  for vanco level today 2. Continue vancomycin, cefepime and metronidazole 3. Final cultures unable to identify gpc in prs. Given it is unclear where source of infection occurred, if related to any poor dentition. Would recommend to treat with his current regimen  Carlyle Basques, [place] [facility] [place] [phone] pager   [phone] cell 06/20/2014, 12:06 PM

## 2014-06-20 NOTE — Progress Notes (Signed)
Patient ID: Alejandro [name], male   DOB: 15-Mar-1946, 68 y.o.   MRN: [id number] Inches most questions with one-word responses. Appears to move his upper extremities well. Denies any significant pain. Impression condition remained stable.

## 2014-06-20 NOTE — Progress Notes (Signed)
Peripherally Inserted Central Catheter/Midline Placement  The IV Nurse has discussed with the patient and/or persons authorized to consent for the patient, the purpose of this procedure and the potential benefits and risks involved with this procedure.  The benefits include less needle sticks, lab draws from the catheter and patient may be discharged home with the catheter.  Risks include, but not limited to, infection, bleeding, blood clot (thrombus formation), and puncture of an artery; nerve damage and irregular heat beat.  Alternatives to this procedure were also discussed.  PICC/Midline Placement Documentation  PICC / Midline Double Lumen 26/33/35 PICC Right Basilic 41 cm 0 cm (Active)  Indication for Insertion or Continuance of Line Prolonged intravenous therapies;Home intravenous therapies (PICC only) 06/20/2014 10:25 AM  Exposed Catheter (cm) 0 cm 06/20/2014 10:25 AM  Site Assessment Clean;Dry;Intact 06/20/2014 10:25 AM  Lumen #1 Status Flushed;Saline locked;Blood return noted 06/20/2014 10:25 AM  Lumen #2 Status Flushed;Saline locked;Blood return noted 06/20/2014 10:25 AM  Dressing Type Transparent 06/20/2014 10:25 AM  Dressing Status Clean;Dry;Intact;Antimicrobial disc in place 06/20/2014 10:25 AM  Line Care Connections checked and tightened 06/20/2014 10:25 AM  Line Adjustment (NICU/IV Team Only) No 06/20/2014 10:25 AM  Dressing Intervention New dressing 06/20/2014 10:25 AM  Dressing Change Due 06/27/14 06/20/2014 10:25 AM       Alejandro [name] 06/20/2014, 10:27 AM

## 2014-06-21 DIAGNOSIS — G09 Sequelae of inflammatory diseases of central nervous system: Secondary | ICD-10-CM

## 2014-06-21 LAB — ANAEROBIC CULTURE

## 2014-06-21 MED ORDER — METRONIDAZOLE 500 MG PO TABS
500.0000 mg | ORAL_TABLET | Freq: Four times a day (QID) | ORAL | Status: DC
  Administered 2014-06-21 – 2014-06-22 (×5): 500 mg via ORAL
  Filled 2014-06-21 (×9): qty 1

## 2014-06-21 NOTE — Progress Notes (Signed)
PULMONARY / CRITICAL CARE MEDICINE   Name: Alejandro [name] MRN: [id number] DOB: 04/24/1946    ADMISSION DATE:  06/15/2014 CONSULTATION DATE:  06/16/2014  REFERRING MD :  Dr. Saintclair Halsted  CHIEF COMPLAINT:  headache  BRIEF PATIENT DESCRIPTION:  68 yo male former smoker had traumatic SAH, SDH, and ICH s/p craniectomy with bone flap.  He was noted to have confusion and headache in rehab.  MRI showed ring enhancing lesion in Rt temporal lobe concerning for abscess.  PCCM consulted to assist with medical management in ICU.  He has hx of A fib, CAD s/p CABG, systolic heart failure, and ETOH.  SIGNIFICANT EVENTS: 5/18 SAH, SDH, ICH >> Rt craniotomy 5/22 Rt craniectomy with partial temporal lobectomy 7/14 Admit, aspiration of subgaleal fluid 7/15 Evacuation of subdural empyema and intracerebral abscess  STUDIES:    LINES / TUBES: PIV  Drain Rt scalp 7/15 >>  CULTURES: Subgaleal fluid 7/14 >>ng Subdural empyema 7/15 >> GPC >> ng Cerebral abscess 7/15 >> GPC pairs >> NG  ANTIBIOTICS: Rocephin 7/14 >> 7/16 Flagyl 7/14 >> Vancomycin >>  Cefepime 7/16 >>  SUBJECTIVE:  Afebrile ,Denies chest pain.  Tolerating diet.  VITAL SIGNS: Temp:  [97.5 F (36.4 C)-98.9 F (37.2 C)] 98.1 F (36.7 C) (07/20 0739) Pulse Rate:  [93] 93 (07/20 0818) Resp:  [16-25] 18 (07/20 0400) BP: (113-157)/(62-116) 155/81 mmHg (07/20 0818) INTAKE / OUTPUT: Intake/Output     07/19 0701 - 07/20 0700 07/20 0701 - 07/21 0700   P.O. 720    I.V. (mL/kg) 10 (0.1)    IV Piggyback 750    Total Intake(mL/kg) 1480 (14.5)    Urine (mL/kg/hr)     Total Output       Net +1480          Urine Occurrence 4 x    Stool Occurrence 3 x      PHYSICAL EXAMINATION: General: no distress Neuro:  CN intact, normal strength HEENT:  pupils reactive, no oral lesions, wound dressing clean Cardiovascular:  Regular, no murmur Lungs:  No wheeze/rales Abdomen:  Soft, non tender Musculoskeletal: no edema Skin:  No  rashes  LABS:  CBC  Recent Labs Lab 06/16/14 1720 06/18/14 0245 06/20/14 1307  WBC 6.9 11.4* 8.8  HGB 10.4* 10.5* 10.5*  HCT 30.9* 31.9* 31.5*  PLT 198 206 171   Coag's  Recent Labs Lab 06/16/14 0441  INR 1.10   BMET  Recent Labs Lab 06/16/14 1720 06/18/14 0245 06/20/14 1307  NA 138 138 139  K 4.0 4.0 3.4*  CL 101 100 98  CO2 21 24 24   BUN 9 10 6   CREATININE 0.77 0.83 0.82  GLUCOSE 103* 154* 122*   Electrolytes  Recent Labs Lab 06/16/14 1720 06/18/14 0245 06/20/14 1307  CALCIUM 8.3* 8.8 9.2   Imaging Dg Chest Port 1 View  06/20/2014   CLINICAL DATA:  Line placement  EXAM: PORTABLE CHEST - 1 VIEW  COMPARISON:  04/29/2014  FINDINGS: Right PICC has its tip in the mid superior vena cava.  There are changes from previous cardiac surgery. Cardiac silhouette is mildly enlarged. No mediastinal or hilar masses.  There are irregularly thickened interstitial markings similar to the prior study. No lung consolidation. Lungs are hyperexpanded. No pleural effusion or pneumothorax.  IMPRESSION: Right PICC line tip in the mid superior vena cava.  Mild interstitial thickening is similar to prior exams, likely chronic. Mild interstitial edema is possible. No evidence of pneumonia.   Electronically Signed   By: Shanon Brow  Ormond M.D.   On: 06/20/2014 10:51     ASSESSMENT / PLAN: NEUROLOGIC A:   Abscess after craniotomy/craniectomy for traumatic SAH, SDH, ICH in May 2015 s/p evacuation of subdural empyema and intracerebral abscess 7/15. P:   Post-op care, AED's per neurosurgery  CARDIOVASCULAR A:  Hx of CAD, HTN, hyperlipidemia, chronic systolic CHF, PAF. P:  No coumadin due to hx of head bleeds Continue coreg, lasix, lisinopril, zocor  RENAL A:   hypokalemia P:   repleted  GASTROINTESTINAL A:   Nutrition. P:   Heart healthy diet Protonix for SUP  HEMATOLOGIC A:   Anemia of chronic disease. P:  F/u CBC SCD for DVT prevention  INFECTIOUS A:   Subdural  empyema and intracerebral abscess - GPC pairs which did not grow out P:   currently on cefepime/flagyl/vancomycin per ID PICC line for long term Abx >> defer to ID and primary team   PCCM to sign off,  Kara Mead MD. Shade Flood. [facility] Pager [phone] If no response call [phone]   06/21/2014, 8:32 AM

## 2014-06-21 NOTE — Progress Notes (Signed)
Patient ID: Alejandro [name], male   DOB: 08-19-1946, 68 y.o.   MRN: [id number] Vital signs are stable. Incision is clean and dry Patient neurologically intact Plan discharge in a.m. Transfer to floor

## 2014-06-21 NOTE — Progress Notes (Signed)
Physical Therapy Treatment Patient Details Name: Alejandro [name] MRN: [id number] DOB: 02-15-1946 Today's Date: 06/21/2014    History of Present Illness Patient admitted for Postoperative brain abscess and subdural empyema                              PT Comments    Patient demonstrates improved stability with ambulation, tolerated there-ex well. Patient educated on progression of mobility upon discharge.  Patient has concerns regarding helmet, will follow up with nsg.   Follow Up Recommendations  Home health PT;Supervision/Assistance - 24 hour     Equipment Recommendations  None recommended by PT    Recommendations for Other Services       Precautions / Restrictions Precautions Precautions: Fall Precaution Comments: Wears helmet with mobility Restrictions Weight Bearing Restrictions: No    Mobility  Bed Mobility                  Transfers Overall transfer level: Needs assistance Equipment used: Rolling walker (2 wheeled) Transfers: Sit to/from Stand Sit to Stand: Min guard         General transfer comment: Verbal cues for hand placement.  Assist to rise to standing from recliner and toilet.  Assist for stability.  Ambulation/Gait Ambulation/Gait assistance: Supervision Ambulation Distance (Feet): 160 Feet Assistive device: Rolling walker (2 wheeled) Gait Pattern/deviations: Step-through pattern;Decreased step length - right;Decreased step length - left;Decreased stride length;Trunk flexed Gait velocity: decreased Gait velocity interpretation: Below normal speed for age/gender General Gait Details: VCs for upright posture    Stairs            Wheelchair Mobility    Modified Rankin (Stroke Patients Only)       Balance     Sitting balance-Leahy Scale: Good       Standing balance-Leahy Scale: Fair                      Cognition Arousal/Alertness: Awake/alert Behavior During Therapy: WFL for tasks assessed/performed Overall  Cognitive Status: Within Functional Limits for tasks assessed                      Exercises General Exercises - Lower Extremity Ankle Circles/Pumps: AROM;Both;10 reps;Seated Long Arc Quad: AROM;Both;10 reps;Seated Hip Flexion/Marching: AROM;Both;10 reps;Seated    General Comments        Pertinent Vitals/Pain No pain at this time, VSS    Home Living                      Prior Function            PT Goals (current goals can now be found in the care plan section) Acute Rehab PT Goals PT Goal Formulation: With patient Time For Goal Achievement: 07/01/14 Potential to Achieve Goals: Good Progress towards PT goals: Progressing toward goals    Frequency  Min 4X/week    PT Plan Current plan remains appropriate    Co-evaluation             End of Session Equipment Utilized During Treatment: Gait belt Activity Tolerance: Patient tolerated treatment well Patient left: in chair;with call bell/phone within reach;with family/visitor present     Time: 8657-8469 PT Time Calculation (min): 27 min  Charges:  $Gait Training: 8-22 mins $Therapeutic Activity: 8-22 mins                    G  Codes:      Duncan Dull 06/21/2014, 12:02 PM Alben Deeds, [place] DPT  [phone]

## 2014-06-21 NOTE — Plan of Care (Signed)
Problem: Consults Goal: Diagnosis - Craniotomy Outcome: Progressing Subdural Empyema

## 2014-06-21 NOTE — Progress Notes (Signed)
ANTIBIOTIC CONSULT NOTE - FOLLOW UP  Pharmacy Consult for Vancomycin Indication: brain abscess  No Known Allergies  Patient Measurements: Height: 5\' 8"  (172.7 cm) Weight: 225 lb 1.4 oz (102.1 kg) IBW/kg (Calculated) : 68.4  Vital Signs: Temp: 97.8 F (36.6 C) (07/20 1159) Temp src: Axillary (07/20 1159) BP: 153/81 mmHg (07/20 1201) Pulse Rate: 93 (07/20 0818) Intake/Output from previous day: 07/19 0701 - 07/20 0700 In: 1480 [P.O.:720; I.V.:10; IV Piggyback:750] Out: -  Intake/Output from this shift: Total I/O In: 100 [IV Piggyback:100] Out: -   Labs:  Recent Labs  06/20/14 1307  WBC 8.8  HGB 10.5*  PLT 171  CREATININE 0.82   Estimated Creatinine Clearance: 99.9 ml/min (by C-G formula based on Cr of 0.82).  Recent Labs  06/18/14 1705  VANCOTROUGH 22.5*    Assessment:  Day # 7 Vancomycin, day # 5 Cefepime (after 3 days of Ceftriaxone), day # 7 IV Flagyl.  Vancomycin dose decreased from 1 gram -> 750 mg IV q12hrs on 7/17, when trough level was 22.5 mcg/ml, above target.  Renal function stable.  Afebrile, WBC 8.8.  Goal of Therapy:  Vancomycin trough level 15-20 mcg/ml  Plan:   Continue Vancomycin 750 mg IV q12hrs.  Vanc trough level prior to am dose on 7/21.  Arty Baumgartner, [place] Pager: [phone] 06/21/2014,2:58 PM

## 2014-06-21 NOTE — Progress Notes (Signed)
Patient ID: Alejandro [name], male   DOB: Nov 22, 1946, 68 y.o.   MRN: [id number]         [facility] for Infectious Disease    Date of Admission:  06/15/2014   Total days of antibiotics 7         Active Problems:   Intracerebral mass   Intracranial abscess   . carvedilol  12.5 mg Oral BID WC  . ceFEPime (MAXIPIME) IV  2 g Intravenous 3 times per day  . FLUoxetine  20 mg Oral Daily  . folic acid  1 mg Oral Daily  . furosemide  40 mg Oral Daily  . levETIRAcetam  500 mg Oral BID  . lisinopril  2.5 mg Oral Daily  . metronidazole  500 mg Intravenous Q8H  . multivitamin with minerals  1 tablet Oral Daily  . pantoprazole  40 mg Oral QHS  . potassium chloride  10 mEq Oral Daily  . simvastatin  40 mg Oral QPM  . sodium chloride  10-40 mL Intracatheter Q12H  . vancomycin  750 mg Intravenous Q12H    Subjective: He denies complaints.  Objective: Temp:  [97.5 F (36.4 C)-98.9 F (37.2 C)] 97.8 F (36.6 C) (07/20 1159) Pulse Rate:  [93] 93 (07/20 0818) Resp:  [16-20] 20 (07/20 1201) BP: (113-155)/(62-116) 153/81 mmHg (07/20 1201) SpO2:  [98 %] 98 % (07/20 1201)  General: He is alert and in no distress sitting up watching television Right frontal craniotomy defect is soft and sunken New right arm PICC in place  Lab Results Lab Results  Component Value Date   WBC 8.8 06/20/2014   HGB 10.5* 06/20/2014   HCT 31.5* 06/20/2014   MCV 86.3 06/20/2014   PLT 171 06/20/2014    Lab Results  Component Value Date   CREATININE 0.82 06/20/2014   BUN 6 06/20/2014   NA 139 06/20/2014   K 3.4* 06/20/2014   CL 98 06/20/2014   CO2 24 06/20/2014    Lab Results  Component Value Date   ALT 65* 05/05/2014   AST 27 05/05/2014   ALKPHOS 67 05/05/2014   BILITOT 0.6 05/05/2014      Microbiology: Recent Results (from the past 240 hour(s))  GRAM STAIN     Status: None   Collection Time    06/15/14  4:27 PM      Result Value Ref Range Status   Specimen Description ABSCESS   Final   Special Requests  BRAIN   Final   Gram Stain     Final   Value: MODERATE WBC PRESENT,BOTH PMN AND MONONUCLEAR     FEW GRAM POSITIVE COCCI IN PAIRS   Report Status 06/15/2014 FINAL   Final  ANAEROBIC CULTURE     Status: None   Collection Time    06/15/14  4:27 PM      Result Value Ref Range Status   Specimen Description ABSCESS   Final   Special Requests FROM BRAIN   Final   Gram Stain     Final   Value: MODERATE WBC PRESENT,BOTH PMN AND MONONUCLEAR     NO SQUAMOUS EPITHELIAL CELLS SEEN     FEW GRAM POSITIVE COCCI     IN PAIRS Performed at [facility]     Performed at [facility]   Culture     Final   Value: NO ANAEROBES ISOLATED     Performed at [facility]   Report Status 06/20/2014 FINAL   Final  CULTURE, ROUTINE-ABSCESS  Status: None   Collection Time    06/15/14  4:27 PM      Result Value Ref Range Status   Specimen Description ABSCESS   Final   Special Requests BRAIN   Final   Gram Stain     Final   Value: MODERATE WBC PRESENT,BOTH PMN AND MONONUCLEAR     NO SQUAMOUS EPITHELIAL CELLS SEEN     FEW GRAM POSITIVE COCCI     IN PAIRS Performed at [facility]     Performed at [facility]   Culture     Final   Value: NO GROWTH 3 DAYS     Performed at [facility]   Report Status 06/19/2014 FINAL   Final  URINE CULTURE     Status: None   Collection Time    06/16/14  8:25 AM      Result Value Ref Range Status   Specimen Description URINE, CLEAN CATCH   Final   Special Requests NONE   Final   Culture  Setup Time     Final   Value: 06/16/2014 13:24     Performed at [place]     Final   Value: NO GROWTH     Performed at [facility]   Culture     Final   Value: NO GROWTH     Performed at [facility]   Report Status 06/17/2014 FINAL   Final  ANAEROBIC CULTURE     Status: None   Collection Time    06/16/14  4:00 PM      Result Value Ref Range Status   Specimen Description WOUND RIGHT    Final   Special Requests HEAD PT ON VANCO ROCEPHIN CRANIOTOMY NO 2   Final   Gram Stain     Final   Value: ABUNDANT WBC PRESENT,BOTH PMN AND MONONUCLEAR     NO SQUAMOUS EPITHELIAL CELLS SEEN     NO ORGANISMS SEEN     Performed at [facility]   Culture     Final   Value: NO ANAEROBES ISOLATED     Performed at [facility]   Report Status 06/21/2014 FINAL   Final  WOUND CULTURE     Status: None   Collection Time    06/16/14  4:00 PM      Result Value Ref Range Status   Specimen Description WOUND RIGHT   Final   Special Requests HEAD PT ON VANCO ROCEPHIN CRANIOTOMY NO 2   Final   Gram Stain     Final   Value: ABUNDANT WBC PRESENT,BOTH PMN AND MONONUCLEAR     NO SQUAMOUS EPITHELIAL CELLS SEEN     NO ORGANISMS SEEN     Performed at [facility]   Culture     Final   Value: NO GROWTH 2 DAYS     Performed at [facility]   Report Status 06/19/2014 FINAL   Final  GRAM STAIN     Status: None   Collection Time    06/16/14  4:00 PM      Result Value Ref Range Status   Specimen Description ABSCESS   Final   Special Requests BRAIN/CRANIOTOMY NO.2 PT ON VANCOMYCIN ROCEPHIN   Final   Gram Stain     Final   Value: ABUNDANT WBC PRESENT,BOTH PMN AND MONONUCLEAR     MODERATE GRAM POSITIVE COCCI IN PAIRS     Gram Stain Report  Called to,Read Back By and Verified With: Dr Saintclair Halsted 16:50 06/16/14 ([email or web address])     NO ORGANISMS SEEN     CORRECTED REPORT CALLED TO MEGAN BERGMAN RN 11:30 06/17/14 ([email or web address])   Report Status 06/16/2014 FINAL   Final  ANAEROBIC CULTURE     Status: None   Collection Time    06/16/14  4:10 PM      Result Value Ref Range Status   Specimen Description WOUND   Final   Special Requests CRANIOTOMY NO 1 PT ON VANCO ROCEPHIN   Final   Gram Stain     Final   Value: FEW WBC PRESENT,BOTH PMN AND MONONUCLEAR     NO SQUAMOUS EPITHELIAL CELLS SEEN     NO ORGANISMS SEEN     Performed at [facility]   Culture     Final   Value: NO ANAEROBES  ISOLATED     Performed at [facility]   Report Status 06/21/2014 FINAL   Final  WOUND CULTURE     Status: None   Collection Time    06/16/14  4:10 PM      Result Value Ref Range Status   Specimen Description WOUND   Final   Special Requests CRANIOTOMY NO 1 PT ON VANCO ROCEPHIN   Final   Gram Stain     Final   Value: FEW WBC PRESENT,BOTH PMN AND MONONUCLEAR     NO SQUAMOUS EPITHELIAL CELLS SEEN     NO ORGANISMS SEEN     Performed at [facility]   Culture     Final   Value: NO GROWTH 2 DAYS     Performed at [facility]   Report Status 06/19/2014 FINAL   Final  GRAM STAIN     Status: None   Collection Time    06/16/14  4:10 PM      Result Value Ref Range Status   Specimen Description ABSCESS   Final   Special Requests BRAIN/CRANIOTOMY  1/PT ON VANCOMYCIN ROCEPHIN   Final   Gram Stain     Final   Value: FEW WBC PRESENT,BOTH PMN AND MONONUCLEAR     FEW GRAM POSITIVE COCCI IN PAIRS     Gram Stain Report Called to,Read Back By and Verified With: Dr Saintclair Halsted 16:50 06/16/14 ([email or web address])     NO ORGANISMS SEEN     CORRECTED REPORT CALLED TO MEGAN BERGMAN RN 11:30 06/17/14 ([email or web address])   Report Status 06/16/2014 FINAL   Final  MRSA PCR SCREENING     Status: None   Collection Time    06/16/14  9:43 PM      Result Value Ref Range Status   MRSA by PCR NEGATIVE  NEGATIVE Final   Comment:            The GeneXpert MRSA Assay (FDA     approved for NASAL specimens     only), is one component of a     comprehensive MRSA colonization     surveillance program. It is not     intended to diagnose MRSA     infection nor to guide or     monitor treatment for     MRSA infections.    Studies/Results: Dg Chest Port 1 View  06/20/2014   CLINICAL DATA:  Line placement  EXAM: PORTABLE CHEST - 1 VIEW  COMPARISON:  04/29/2014  FINDINGS: Right PICC has its tip in the mid superior vena cava.  There are changes from previous cardiac surgery. Cardiac  silhouette is mildly enlarged. No  mediastinal or hilar masses.  There are irregularly thickened interstitial markings similar to the prior study. No lung consolidation. Lungs are hyperexpanded. No pleural effusion or pneumothorax.  IMPRESSION: Right PICC line tip in the mid superior vena cava.  Mild interstitial thickening is similar to prior exams, likely chronic. Mild interstitial edema is possible. No evidence of pneumonia.   Electronically Signed   By: Lajean Manes M.D.   On: 06/20/2014 10:51    Assessment: I will continue broad empiric therapy for postoperative brain abscess.  Plan: 1. Continue IV vancomycin and cefepime 2. Change metronidazole to oral form  Michel Bickers, MD [facility] for [place] [phone] pager   [phone] cell 06/21/2014, 4:03 PM

## 2014-06-22 LAB — VANCOMYCIN, TROUGH
Vancomycin Tr: 21.2 ug/mL — ABNORMAL HIGH (ref 10.0–20.0)
Vancomycin Tr: 25 ug/mL — ABNORMAL HIGH (ref 10.0–20.0)

## 2014-06-22 MED ORDER — HEPARIN SOD (PORK) LOCK FLUSH 100 UNIT/ML IV SOLN
250.0000 [IU] | INTRAVENOUS | Status: DC | PRN
  Administered 2014-06-22: 250 [IU]
  Filled 2014-06-22: qty 3

## 2014-06-22 MED ORDER — HEPARIN SOD (PORK) LOCK FLUSH 100 UNIT/ML IV SOLN
250.0000 [IU] | INTRAVENOUS | Status: AC | PRN
  Administered 2014-06-22: 250 [IU]

## 2014-06-22 MED ORDER — CIPROFLOXACIN HCL 500 MG PO TABS
750.0000 mg | ORAL_TABLET | Freq: Two times a day (BID) | ORAL | Status: DC
  Administered 2014-06-22: 750 mg via ORAL
  Filled 2014-06-22 (×3): qty 1

## 2014-06-22 MED ORDER — HEPARIN SOD (PORK) LOCK FLUSH 100 UNIT/ML IV SOLN
250.0000 [IU] | Freq: Every day | INTRAVENOUS | Status: DC
Start: 2014-06-22 — End: 2014-06-22
  Filled 2014-06-22: qty 3

## 2014-06-22 NOTE — Progress Notes (Signed)
[facility]  Patient Status: New pt for [facility] this admission  [facility] is providing the following services: HHRN and Home Infusion Pharmacy for Home IV ABX.  [facility] Infusion Coordinator will provide in hospital IV ABX teaching to support transition home and independence with IV ABX once DCd.  If patient discharges after hours, please call [phone].   [name] 06/22/2014, 10:19 AM

## 2014-06-22 NOTE — Progress Notes (Signed)
Patient ID: Alejandro [name], male   DOB: November 16, 1946, 68 y.o.   MRN: [id number]         [facility] for Infectious Disease    Date of Admission:  06/15/2014   Total days of antibiotics 8         Active Problems:   Intracerebral mass   Intracranial abscess   . carvedilol  12.5 mg Oral BID WC  . ceFEPime (MAXIPIME) IV  2 g Intravenous 3 times per day  . FLUoxetine  20 mg Oral Daily  . folic acid  1 mg Oral Daily  . furosemide  40 mg Oral Daily  . levETIRAcetam  500 mg Oral BID  . lisinopril  2.5 mg Oral Daily  . metroNIDAZOLE  500 mg Oral 4 times per day  . multivitamin with minerals  1 tablet Oral Daily  . pantoprazole  40 mg Oral QHS  . potassium chloride  10 mEq Oral Daily  . simvastatin  40 mg Oral QPM  . sodium chloride  10-40 mL Intracatheter Q12H  . vancomycin  750 mg Intravenous Q12H    Objective: Temp:  [97.4 F (36.3 C)-98.2 F (36.8 C)] 97.8 F (36.6 C) (07/21 0950) Pulse Rate:  [87-90] 90 (07/21 0950) Resp:  [14-24] 18 (07/21 0950) BP: (132-159)/(73-85) 159/78 mmHg (07/21 0950) SpO2:  [96 %-100 %] 96 % (07/21 0950) Weight:  [100.789 kg (222 lb 3.2 oz)] 100.789 kg (222 lb 3.2 oz) (07/21 0630)  Lab Results Lab Results  Component Value Date   WBC 8.8 06/20/2014   HGB 10.5* 06/20/2014   HCT 31.5* 06/20/2014   MCV 86.3 06/20/2014   PLT 171 06/20/2014    Lab Results  Component Value Date   CREATININE 0.82 06/20/2014   BUN 6 06/20/2014   NA 139 06/20/2014   K 3.4* 06/20/2014   CL 98 06/20/2014   CO2 24 06/20/2014    Assessment: I will simplify his antibiotic regimen for out patient therapy by changing cefe[pime to oral ciprofloxacin and continuing IV vancomycin and oral metronidazole.  Plan: 1. Discharge home on IV vancomycin, oral ciprofloxacin and oral metronidazole through August 25. 2. I will arrange [facility] follow up  Michel Bickers, [place] for [place] [phone] pager   [phone] cell 06/22/2014, 12:51  PM

## 2014-06-22 NOTE — Progress Notes (Signed)
ANTIBIOTIC CONSULT NOTE - FOLLOW UP  Pharmacy Consult for Vancomycin Indication: endocarditis and Brain abscess  No Known Allergies  Patient Measurements: Height: 5\' 8"  (172.7 cm) Weight: 222 lb 3.2 oz (100.789 kg) IBW/kg (Calculated) : 68.4 Adjusted Body Weight:    Vital Signs: Temp: 97.3 F (36.3 C) (07/21 1759) Temp src: Oral (07/21 1759) BP: 155/84 mmHg (07/21 1759) Pulse Rate: 117 (07/21 1759) Intake/Output from previous day: 07/20 0701 - 07/21 0700 In: 470 [P.O.:120; IV Piggyback:350] Out: -  Intake/Output from this shift: Total I/O In: 240 [P.O.:240] Out: -   Labs:  Recent Labs  06/20/14 1307  WBC 8.8  HGB 10.5*  PLT 171  CREATININE 0.82   Estimated Creatinine Clearance: 99.3 ml/min (by C-G formula based on Cr of 0.82).  Recent Labs  06/22/14 0555 06/22/14 1800  VANCOTROUGH 21.2* 25.0*     Microbiology: Recent Results (from the past 720 hour(s))  GRAM STAIN     Status: None   Collection Time    06/15/14  4:27 PM      Result Value Ref Range Status   Specimen Description ABSCESS   Final   Special Requests BRAIN   Final   Gram Stain     Final   Value: MODERATE WBC PRESENT,BOTH PMN AND MONONUCLEAR     FEW GRAM POSITIVE COCCI IN PAIRS   Report Status 06/15/2014 FINAL   Final  ANAEROBIC CULTURE     Status: None   Collection Time    06/15/14  4:27 PM      Result Value Ref Range Status   Specimen Description ABSCESS   Final   Special Requests FROM BRAIN   Final   Gram Stain     Final   Value: MODERATE WBC PRESENT,BOTH PMN AND MONONUCLEAR     NO SQUAMOUS EPITHELIAL CELLS SEEN     FEW GRAM POSITIVE COCCI     IN PAIRS Performed at [facility]     Performed at [facility]   Culture     Final   Value: NO ANAEROBES ISOLATED     Performed at [facility]   Report Status 06/20/2014 FINAL   Final  CULTURE, ROUTINE-ABSCESS     Status: None   Collection Time    06/15/14  4:27 PM      Result Value Ref Range Status   Specimen  Description ABSCESS   Final   Special Requests BRAIN   Final   Gram Stain     Final   Value: MODERATE WBC PRESENT,BOTH PMN AND MONONUCLEAR     NO SQUAMOUS EPITHELIAL CELLS SEEN     FEW GRAM POSITIVE COCCI     IN PAIRS Performed at [facility]     Performed at [facility]   Culture     Final   Value: NO GROWTH 3 DAYS     Performed at [facility]   Report Status 06/19/2014 FINAL   Final  URINE CULTURE     Status: None   Collection Time    06/16/14  8:25 AM      Result Value Ref Range Status   Specimen Description URINE, CLEAN CATCH   Final   Special Requests NONE   Final   Culture  Setup Time     Final   Value: 06/16/2014 13:24     Performed at [facility]     Final   Value: NO GROWTH  Performed at [facility]     Final   Value: NO GROWTH     Performed at [facility]   Report Status 06/17/2014 FINAL   Final  ANAEROBIC CULTURE     Status: None   Collection Time    06/16/14  4:00 PM      Result Value Ref Range Status   Specimen Description WOUND RIGHT   Final   Special Requests HEAD PT ON VANCO ROCEPHIN CRANIOTOMY NO 2   Final   Gram Stain     Final   Value: ABUNDANT WBC PRESENT,BOTH PMN AND MONONUCLEAR     NO SQUAMOUS EPITHELIAL CELLS SEEN     NO ORGANISMS SEEN     Performed at [facility]   Culture     Final   Value: NO ANAEROBES ISOLATED     Performed at [facility]   Report Status 06/21/2014 FINAL   Final  WOUND CULTURE     Status: None   Collection Time    06/16/14  4:00 PM      Result Value Ref Range Status   Specimen Description WOUND RIGHT   Final   Special Requests HEAD PT ON VANCO ROCEPHIN CRANIOTOMY NO 2   Final   Gram Stain     Final   Value: ABUNDANT WBC PRESENT,BOTH PMN AND MONONUCLEAR     NO SQUAMOUS EPITHELIAL CELLS SEEN     NO ORGANISMS SEEN     Performed at [facility]   Culture     Final   Value: NO GROWTH 2 DAYS     Performed at [facility]   Report Status 06/19/2014 FINAL   Final  GRAM STAIN     Status: None   Collection Time    06/16/14  4:00 PM      Result Value Ref Range Status   Specimen Description ABSCESS   Final   Special Requests BRAIN/CRANIOTOMY NO.2 PT ON VANCOMYCIN ROCEPHIN   Final   Gram Stain     Final   Value: ABUNDANT WBC PRESENT,BOTH PMN AND MONONUCLEAR     MODERATE GRAM POSITIVE COCCI IN PAIRS     Gram Stain Report Called to,Read Back By and Verified With: Dr Saintclair Halsted 16:50 06/16/14 ([email or web address])     NO ORGANISMS SEEN     CORRECTED REPORT CALLED TO MEGAN BERGMAN RN 11:30 06/17/14 ([email or web address])   Report Status 06/16/2014 FINAL   Final  ANAEROBIC CULTURE     Status: None   Collection Time    06/16/14  4:10 PM      Result Value Ref Range Status   Specimen Description WOUND   Final   Special Requests CRANIOTOMY NO 1 PT ON VANCO ROCEPHIN   Final   Gram Stain     Final   Value: FEW WBC PRESENT,BOTH PMN AND MONONUCLEAR     NO SQUAMOUS EPITHELIAL CELLS SEEN     NO ORGANISMS SEEN     Performed at [facility]   Culture     Final   Value: NO ANAEROBES ISOLATED     Performed at [facility]   Report Status 06/21/2014 FINAL   Final  WOUND CULTURE     Status: None   Collection Time    06/16/14  4:10 PM      Result Value Ref Range Status   Specimen Description WOUND   Final   Special Requests CRANIOTOMY NO 1 PT ON VANCO ROCEPHIN  Final   Gram Stain     Final   Value: FEW WBC PRESENT,BOTH PMN AND MONONUCLEAR     NO SQUAMOUS EPITHELIAL CELLS SEEN     NO ORGANISMS SEEN     Performed at [facility]   Culture     Final   Value: NO GROWTH 2 DAYS     Performed at [facility]   Report Status 06/19/2014 FINAL   Final  GRAM STAIN     Status: None   Collection Time    06/16/14  4:10 PM      Result Value Ref Range Status   Specimen Description ABSCESS   Final   Special Requests BRAIN/CRANIOTOMY  1/PT ON VANCOMYCIN ROCEPHIN   Final   Gram Stain     Final   Value: FEW WBC  PRESENT,BOTH PMN AND MONONUCLEAR     FEW GRAM POSITIVE COCCI IN PAIRS     Gram Stain Report Called to,Read Back By and Verified With: Dr Saintclair Halsted 16:50 06/16/14 ([email or web address])     NO ORGANISMS SEEN     CORRECTED REPORT CALLED TO MEGAN BERGMAN RN 11:30 06/17/14 ([email or web address])   Report Status 06/16/2014 FINAL   Final  MRSA PCR SCREENING     Status: None   Collection Time    06/16/14  9:43 PM      Result Value Ref Range Status   MRSA by PCR NEGATIVE  NEGATIVE Final   Comment:            The GeneXpert MRSA Assay (FDA     approved for NASAL specimens     only), is one component of a     comprehensive MRSA colonization     surveillance program. It is not     intended to diagnose MRSA     infection nor to guide or     monitor treatment for     MRSA infections.    Anti-infectives   Start     Dose/Rate Route Frequency Ordered Stop   06/22/14 1400  ciprofloxacin (CIPRO) tablet 750 mg     750 mg Oral 2 times daily 06/22/14 1259     06/21/14 1800  metroNIDAZOLE (FLAGYL) tablet 500 mg     500 mg Oral 4 times per day 06/21/14 1606     06/18/14 1830  vancomycin (VANCOCIN) IVPB 750 mg/150 ml premix     750 mg 150 mL/hr over 60 Minutes Intravenous Every 12 hours 06/18/14 1828     06/17/14 1400  ceFEPIme (MAXIPIME) 1 g in dextrose 5 % 50 mL IVPB  Status:  Discontinued     1 g 100 mL/hr over 30 Minutes Intravenous 3 times per day 06/17/14 1144 06/17/14 1152   06/17/14 1400  ceFEPIme (MAXIPIME) 2 g in dextrose 5 % 50 mL IVPB  Status:  Discontinued     2 g 100 mL/hr over 30 Minutes Intravenous 3 times per day 06/17/14 1152 06/22/14 1259   06/16/14 1542  bacitracin 50,000 Units in sodium chloride irrigation 0.9 % 500 mL irrigation  Status:  Discontinued       As needed 06/16/14 1543 06/16/14 1625   06/16/14 0600  vancomycin (VANCOCIN) IVPB 1000 mg/200 mL premix  Status:  Discontinued     1,000 mg 200 mL/hr over 60 Minutes Intravenous Every 12 hours 06/15/14 1646 06/18/14 1828   06/15/14 1830  metroNIDAZOLE  (FLAGYL) IVPB 500 mg  Status:  Discontinued     500 mg 100 mL/hr over 60 Minutes Intravenous  Every 8 hours 06/15/14 1626 06/21/14 1606   06/15/14 1800  cefTRIAXone (ROCEPHIN) 2 g in dextrose 5 % 50 mL IVPB  Status:  Discontinued     2 g 100 mL/hr over 30 Minutes Intravenous Every 12 hours 06/15/14 1626 06/17/14 1144   06/15/14 1700  vancomycin (VANCOCIN) 2,000 mg in sodium chloride 0.9 % 500 mL IVPB     2,000 mg 250 mL/hr over 120 Minutes Intravenous  Once 06/15/14 1646 06/15/14 2013      Assessment: subdural empyema + intracerebral abscess after craniotomy/craniectomy, started on vanc, ceftriaxone, metronidazole. ID was consulted and changed rocephin to cefepime. All cultures have been neg so far. Planning 6 wk of abx as long as cx don't grow something else through 8/25. Afebrile, WBC down to 8.8  Vanc 7/14>> 7/17 - VT = 22.5 on 1g q 12; dose chg to 750 q 12hr 7/21- Vanc trough drawn after bag hung in AM, recheck this PM. 21.2 7/21: PM. Level 25 with level drawn appropriately. Ceftriaxone 7/14>>7/16 Metronidazole 7/14>> Cefepime 7/16>>  7/14 gram stain GPC prs, but later corrected 7/14 abcess: neg 7/15 wound x 2: neg 7/15 anaerobic: none seen 7/15 urine: neg  **Patient has had 3 trough levels slightly greater than goal 15-20 despite CrCl 99 and weight based dosing per protocol. Level tonight 25. Patient has received 67min worth of Vanco tonight. Called RN Ester and had her stop infusion for this evening. Will fax information to [facility] at [phone].  Goal of Therapy:  Vancomycin trough level 15-20 mcg/ml  Plan:  Hold Vancomycin dose tonight to allow for drug clearance. Fax information to [facility] in [facility].   Dakiyah Heinke S. Alford Highland, PharmD, BCPS Clinical Staff Pharmacist Pager [phone]  Eilene Ghazi Stillinger 06/22/2014,6:44 PM

## 2014-06-22 NOTE — Progress Notes (Addendum)
Spoke with patient about HHPT/RN/IV abx arrangements. Confirmed that address and phone numbers listed in EPIC are correct. Says that his wife is in good health and able to assist him with administering the medication.  Educated him on the importance of caring for his PICC line and the risk for infection--he stated understanding.  [facility] chosen for HHRN/DME/IV meds. Assigned RN is aware of arrangements made.  Pt able to d/c home after his afternoon dose if abx if MD orders discharge.   Sandi Mariscal, RN BSN [facility] CCM  Case Manager, Trauma Service/Unit 54M [phone]

## 2014-06-22 NOTE — Progress Notes (Signed)
OT Cancellation Note  Patient Details Name: Alejandro [name] MRN: [id number] DOB: 03-Sep-1946   Cancelled Treatment:     Attempted to see. Pt not available. Will try to return this pm.  [place], OTR/L  [phone] 06/22/2014 06/22/2014, 12:00 PM

## 2014-06-22 NOTE — Progress Notes (Signed)
Physical Therapy Treatment Patient Details Name: Alejandro [name] MRN: [id number] DOB: Jul 09, 1946 Today's Date: 06/22/2014    History of Present Illness Patient admitted for Postoperative brain abscess and subdural empyema                              PT Comments    Patient ambulating well. Spoke with patient and wife regarding mobility expectations and safety for discharge. Simulated car transfer with patient with verbal feedback. Patient and wife had some questions regarding use of helmet. Patient had been wearing the helmet at all times including in the shower (thery would like clarification on use of helmet from MD).    Follow Up Recommendations  Home health PT;Supervision/Assistance - 24 hour     Equipment Recommendations  None recommended by PT    Recommendations for Other Services       Precautions / Restrictions Precautions Precautions: Fall Precaution Comments: Wears helmet with mobility (asking for clarification) Restrictions Weight Bearing Restrictions: No    Mobility  Bed Mobility                  Transfers Overall transfer level: Needs assistance Equipment used: Rolling walker (2 wheeled) Transfers: Sit to/from Stand Sit to Stand: Min guard         General transfer comment: VCs for hand placement and safety  Ambulation/Gait Ambulation/Gait assistance: Supervision Ambulation Distance (Feet): 340 Feet Assistive device: Rolling walker (2 wheeled) Gait Pattern/deviations: Step-through pattern;Decreased step length - right;Decreased step length - left;Decreased stride length;Trunk flexed Gait velocity: improving Gait velocity interpretation: Below normal speed for age/gender General Gait Details: VCs for upright posture   Stairs Stairs: Yes          Wheelchair Mobility    Modified Rankin (Stroke Patients Only)       Balance     Sitting balance-Leahy Scale: Good       Standing balance-Leahy Scale: Fair                       Cognition Arousal/Alertness: Awake/alert Behavior During Therapy: WFL for tasks assessed/performed Overall Cognitive Status: Within Functional Limits for tasks assessed                      Exercises General Exercises - Lower Extremity Ankle Circles/Pumps: AROM;Both;10 reps;Seated    General Comments        Pertinent Vitals/Pain No pain    Home Living                      Prior Function            PT Goals (current goals can now be found in the care plan section) Acute Rehab PT Goals Patient Stated Goal: to go home PT Goal Formulation: With patient Time For Goal Achievement: 07/01/14 Potential to Achieve Goals: Good Progress towards PT goals: Progressing toward goals    Frequency  Min 4X/week    PT Plan Current plan remains appropriate    Co-evaluation             End of Session Equipment Utilized During Treatment: Gait belt Activity Tolerance: Patient tolerated treatment well Patient left: in chair;with call bell/phone within reach;with family/visitor present     Time: 3646-8032 PT Time Calculation (min): 26 min  Charges:  $Gait Training: 8-22 mins $Self Care/Home Management: 8-22  G CodesDuncan Dull July 17, 2014, 2:45 PM Alben Deeds, [place] DPT  [phone]

## 2014-06-22 NOTE — Progress Notes (Signed)
Patient transferred from ICU to bed 4N07, alert and oriented. Respirations are even and unlabored, NAD noted. Incision is approximated, clean, no drainage  Noted. Patient denies any pain at this moment. Will continue to monitor.

## 2014-06-22 NOTE — Progress Notes (Signed)
Pt vancomycin stated at 1822 but stopped per Pharmacy as vancomycin level is 25. Pt has no c/o . Discharge instruction ,antibiotic administration teaching reen forced and pt wife demonstrated good  Understanding with a teach back. Condition at discharge is stable incision site intact no s/s of redness noted.

## 2014-06-22 NOTE — Discharge Summary (Signed)
Physician Discharge Summary  Patient ID: Alejandro [name] MRN: [id number] DOB/AGE: 12-30-1945 68 y.o.  Admit date: 06/15/2014 Discharge date: 06/22/2014  Admission Diagnoses:Intracerebral cyst and infection  Discharge Diagnoses: Same Active Problems:   Intracerebral mass   Intracranial abscess   Discharged Condition: fair  Hospital Course: Patient underwent craniotomy and cyst aspiration with IV ABX  Consults: ID  Significant Diagnostic Studies: None  Treatments: surgery: Patient underwent craniotomy and cyst aspiration with IV ABX  Discharge Exam: Blood pressure 113/67, pulse 87, temperature 98 F (36.7 C), temperature source Oral, resp. rate 18, height 5\' 8"  (1.727 m), weight 100.789 kg (222 lb 3.2 oz), SpO2 97.00%. Neurologic: Alert and oriented X 3, normal strength and tone. Normal symmetric reflexes. Normal coordination and gait Wound:CDI  Disposition: Home with visiting Nursing     Medication List         carvedilol 12.5 MG tablet  Commonly known as:  COREG  Take 12.5 mg by mouth 2 (two) times daily with a meal.     FLUoxetine 20 MG tablet  Commonly known as:  PROZAC  Take 1 tablet (20 mg total) by mouth daily.     folic acid 1 MG tablet  Commonly known as:  FOLVITE  Take 1 tablet (1 mg total) by mouth daily.     furosemide 40 MG tablet  Commonly known as:  LASIX  Take 1 tablet (40 mg total) by mouth daily.     levETIRAcetam 500 MG tablet  Commonly known as:  KEPPRA  Take 1 tablet (500 mg total) by mouth 2 (two) times daily.     lisinopril 2.5 MG tablet  Commonly known as:  ZESTRIL  Take 1 tablet (2.5 mg total) by mouth daily.     multivitamin with minerals Tabs tablet  Take 1 tablet by mouth daily.     pantoprazole 40 MG tablet  Commonly known as:  PROTONIX  Take 1 tablet (40 mg total) by mouth at bedtime.     potassium chloride 10 MEQ tablet  Commonly known as:  K-DUR  Take 1 tablet daily     simvastatin 40 MG tablet  Commonly known as:   ZOCOR  Take 1 tablet (40 mg total) by mouth every evening.         Signed: Peggyann Shoals, MD 06/22/2014, 5:50 PM

## 2014-06-23 ENCOUNTER — Ambulatory Visit: Payer: BC Managed Care – PPO | Admitting: Internal Medicine

## 2014-06-29 ENCOUNTER — Other Ambulatory Visit: Payer: Self-pay | Admitting: *Deleted

## 2014-07-02 ENCOUNTER — Other Ambulatory Visit: Payer: Self-pay

## 2014-07-02 DIAGNOSIS — E876 Hypokalemia: Secondary | ICD-10-CM

## 2014-07-02 MED ORDER — POTASSIUM CHLORIDE ER 10 MEQ PO TBCR: EXTENDED_RELEASE_TABLET | ORAL | Status: DC

## 2014-07-02 MED ORDER — PANTOPRAZOLE SODIUM 40 MG PO TBEC: 40.0000 mg | DELAYED_RELEASE_TABLET | Freq: Every day | ORAL | Status: DC

## 2014-07-08 ENCOUNTER — Telehealth: Payer: Self-pay

## 2014-07-08 NOTE — Telephone Encounter (Signed)
Call from [facility] pharmacy to request refill information on protonix. Gave verbal ok per authorization from 7/31. #30 with 6 refills

## 2014-07-10 ENCOUNTER — Other Ambulatory Visit: Payer: Self-pay | Admitting: Cardiovascular Disease

## 2014-07-12 ENCOUNTER — Other Ambulatory Visit: Payer: Self-pay

## 2014-07-21 ENCOUNTER — Other Ambulatory Visit: Payer: Self-pay

## 2014-07-21 ENCOUNTER — Ambulatory Visit: Payer: BC Managed Care – PPO | Admitting: Internal Medicine

## 2014-07-21 ENCOUNTER — Ambulatory Visit (INDEPENDENT_AMBULATORY_CARE_PROVIDER_SITE_OTHER): Payer: BC Managed Care – PPO | Admitting: Internal Medicine

## 2014-07-21 ENCOUNTER — Encounter
Payer: BC Managed Care – PPO | Attending: Physical Medicine & Rehabilitation | Admitting: Physical Medicine & Rehabilitation

## 2014-07-21 ENCOUNTER — Encounter: Payer: Self-pay | Admitting: Internal Medicine

## 2014-07-21 VITALS — BP 116/75 | HR 91 | Temp 97.8°F | Wt 222.2 lb

## 2014-07-21 DIAGNOSIS — F411 Generalized anxiety disorder: Secondary | ICD-10-CM

## 2014-07-21 DIAGNOSIS — G060 Intracranial abscess and granuloma: Secondary | ICD-10-CM

## 2014-07-21 DIAGNOSIS — I6200 Nontraumatic subdural hemorrhage, unspecified: Secondary | ICD-10-CM

## 2014-07-21 DIAGNOSIS — S065XAA Traumatic subdural hemorrhage with loss of consciousness status unknown, initial encounter: Secondary | ICD-10-CM

## 2014-07-21 DIAGNOSIS — S065X9A Traumatic subdural hemorrhage with loss of consciousness of unspecified duration, initial encounter: Secondary | ICD-10-CM

## 2014-07-21 DIAGNOSIS — R609 Edema, unspecified: Secondary | ICD-10-CM

## 2014-07-21 MED ORDER — LEVETIRACETAM 500 MG PO TABS
500.0000 mg | ORAL_TABLET | Freq: Two times a day (BID) | ORAL | Status: DC
Start: 2014-07-21 — End: 2015-02-11

## 2014-07-21 MED ORDER — FLUOXETINE HCL 20 MG PO TABS
20.0000 mg | ORAL_TABLET | Freq: Every day | ORAL | Status: DC
Start: 2014-07-21 — End: 2015-03-14

## 2014-07-21 NOTE — Assessment & Plan Note (Signed)
Status post a craniotomy, on IV antibiotics, will see ID: 07/27/2014. Further antibiotics per ID

## 2014-07-21 NOTE — Assessment & Plan Note (Addendum)
Switched to Prozac at the last visit, doing better. Refill medications

## 2014-07-21 NOTE — Progress Notes (Signed)
Subjective:    Patient ID: Alejandro [name], male    DOB: [birth year], 68 y.o.   MRN: [id number]  DOS:  07/21/2014 Type of visit - description: f/u, here w/ wife History: At last visit  He was switch from Lexapro to Prozac, mood has improved. He is not as sleepy. Also, was admited to the hospital w/ a intracerebral  abscess, s/p craneotomy, currently on IV abx  Needs a RF on keppra ROS  Denies F/C. No major headaches  Sleeps well. Edema has decreased   Past Medical History  Diagnosis Date  . Systolic heart failure   . 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 Dyslipidemia   . HTN (hypertension)   . Atrial fibrillation   . Cancer     around nose  . Tobacco abuse   . Alcohol abuse   . H/O hiatal hernia     Past Surgical History  Procedure Laterality Date  . Coronary artery bypass graft  12/24/2008    4 vessel  . Umbilical hernia repair  2010  . Hernia repair  2012  . Coronary artery bypass graft  2012  . Ventral hernia repair N/A 01/22/2013    Procedure: HERNIA REPAIR VENTRAL ADULT;  Surgeon: [name], MD;  Location: [place];  Service: General;  Laterality: N/A;  . Application of a-cell of chest/abdomen N/A 01/22/2013    Procedure: APPLICATION OF A-CELL OF CHEST/ABDOMEN;  Surgeon: [name], MD;  Location: [place];  Service: General;  Laterality: N/A;  . Cystoscopy N/A 01/22/2013    Procedure: Consuela Mimes;  Surgeon: [name], MD;  Location: [place];  Service: Urology;  Laterality: N/A;  Cystoscopy with balloon dilation. Insertion of coude catheter.  . Craniotomy Right 04/19/2014    Procedure: CRANIOTOMY HEMATOMA EVACUATION SUBDURAL;  Surgeon: [name], MD;  Location: [place] [facility];  Service: Neurosurgery;  Laterality: Right;  right  . Craniotomy Right 04/23/2014    Procedure: Craniotomy for Intracerebral Hemorrhage;  Surgeon: [name], MD;  Location: [place] [facility];  Service: Neurosurgery;  Laterality: Right;  . Craniotomy N/A 06/16/2014   Procedure: Craniotomy for intracerebral abscess and subdural empyema;  Surgeon: [name], MD;  Location: [place] [facility];  Service: Neurosurgery;  Laterality: N/A;    History   Social History  . Marital Status: Married    Spouse Name: N/A    Number of Children: 2  . Years of Education: N/A   Occupational History  . [employer]   .     Social History Main Topics  . Smoking status: Former Smoker -- 1.00 packs/day for 50 years    Types: Cigarettes    Quit date: 04/19/2013  . Smokeless tobacco: Never Used  . Alcohol Use: 6.0 oz/week    10 Cans of beer per week     Comment: 4-5  12oz beers daily  . Drug Use: No  . Sexual Activity: Not on file   Other Topics Concern  . Not on file   Social History Narrative  . No narrative on file        Medication List       This list is accurate as of: 07/21/14  6:58 PM.  Always use your most recent med list.               carvedilol 12.5 MG tablet  Commonly known as:  COREG  TAKE 2 TABLETS TWICE A  DAY     diltiazem 60 MG tablet  Commonly known as:  CARDIZEM     FLUoxetine 20 MG tablet  Commonly known as:  PROZAC  Take 1 tablet (20 mg total) by mouth daily.     folic acid 1 MG tablet  Commonly known as:  FOLVITE  Take 1 tablet (1 mg total) by mouth daily.     furosemide 40 MG tablet  Commonly known as:  LASIX  Take 1 tablet (40 mg total) by mouth daily.     levETIRAcetam 500 MG tablet  Commonly known as:  KEPPRA  Take 1 tablet (500 mg total) by mouth 2 (two) times daily.     lisinopril 2.5 MG tablet  Commonly known as:  ZESTRIL  Take 1 tablet (2.5 mg total) by mouth daily.     multivitamin with minerals Tabs tablet  Take 1 tablet by mouth daily.     pantoprazole 40 MG tablet  Commonly known as:  PROTONIX  Take 1 tablet (40 mg total) by mouth at bedtime.     potassium chloride 10 MEQ tablet  Commonly known as:  K-DUR  Take 1 tablet daily     simvastatin 40 MG tablet  Commonly known as:  ZOCOR   Take 1 tablet (40 mg total) by mouth every evening.           Objective:   Physical Exam BP 116/75  Pulse 91  Temp(Src) 97.8 F (36.6 C) (Oral)  Wt 222 lb 4 oz (100.812 kg)  SpO2 97%  General -- alert, well-developed, NAD.  Lungs -- normal respiratory effort, no intercostal retractions, no accessory muscle use, and normal breath sounds.  Heart-- irreg.   Extremities-- no pretibial edema bilaterally  Neurologic--  alert & oriented X3.   Psych--slt slow to answer but seems that cognition and judgment are intact. Cooperative with normal attention span and concentration. No anxious or depressed appearing.      Assessment & Plan:

## 2014-07-21 NOTE — Assessment & Plan Note (Signed)
Definitely improved , continue w/ daily Lasix, recent BMP ok. No change

## 2014-07-21 NOTE — Progress Notes (Signed)
Pre-visit discussion using our clinic review tool. No additional management support is needed unless otherwise documented below in the visit note.  

## 2014-07-21 NOTE — Patient Instructions (Signed)
Next visit is for routine check up in 4 months  Get a flu shot this Fall

## 2014-07-21 NOTE — Assessment & Plan Note (Signed)
Under the care of neurosurgery, on Keppra, they request a refill which is granted; at the same time I asked them to discuss with neurosurgery the need to keep taking sz medicines

## 2014-07-26 ENCOUNTER — Other Ambulatory Visit: Payer: Self-pay

## 2014-07-26 MED ORDER — FOLIC ACID 1 MG PO TABS: 1.0000 mg | ORAL_TABLET | Freq: Every day | ORAL | Status: DC

## 2014-07-27 ENCOUNTER — Encounter: Payer: Self-pay | Admitting: Internal Medicine

## 2014-07-27 ENCOUNTER — Ambulatory Visit (INDEPENDENT_AMBULATORY_CARE_PROVIDER_SITE_OTHER): Payer: BC Managed Care – PPO | Admitting: Internal Medicine

## 2014-07-27 VITALS — BP 113/74 | HR 125 | Temp 97.6°F | Ht 68.0 in | Wt 224.2 lb

## 2014-07-27 DIAGNOSIS — Z23 Encounter for immunization: Secondary | ICD-10-CM

## 2014-07-27 NOTE — Progress Notes (Signed)
Phone call to [facility].  Verbal order from Dr. Megan Salon, stop vancomycin and discontinue PICC.  Coretta verbalized back the order.

## 2014-07-27 NOTE — Progress Notes (Signed)
Patient ID: Alejandro [name], male   DOB: [birth year], 68 y.o.   MRN: [id number]         [facility] for Infectious Disease  Patient Active Problem List   Diagnosis Date Noted  . Intracranial abscess 06/16/2014  . Intracerebral mass 06/15/2014  . Anxiety state, unspecified 06/09/2014  . Edema 05/24/2014  . Alcohol abuse 04/23/2014  . SDH (subdural hematoma) 04/19/2014  . Accelerated hypertension 04/19/2014  . Encounter for therapeutic drug monitoring 02/09/2014  . Recurrent ventral hernia 10/22/2012  . Skin lesion 09/26/2012  . Hernia 09/26/2012  . SYSTOLIC HEART FAILURE, CHRONIC 06/15/2009  . SYSTOLIC HEART FAILURE, ACUTE ON CHRONIC 01/28/2009  . HYPERLIPIDEMIA-MIXED 01/27/2009  . ATRIAL FIBRILLATION 01/27/2009  . Coronary atherosclerosis of native coronary artery 12/23/2008    Patient's Medications  New Prescriptions   No medications on file  Previous Medications   CARVEDILOL (COREG) 12.5 MG TABLET    TAKE 2 TABLETS TWICE A DAY   DILTIAZEM (CARDIZEM) 60 MG TABLET       FLUOXETINE (PROZAC) 20 MG TABLET    Take 1 tablet (20 mg total) by mouth daily.   FOLIC ACID (FOLVITE) 1 MG TABLET    Take 1 tablet (1 mg total) by mouth daily.   FUROSEMIDE (LASIX) 40 MG TABLET    Take 1 tablet (40 mg total) by mouth daily.   LEVETIRACETAM (KEPPRA) 500 MG TABLET    Take 1 tablet (500 mg total) by mouth 2 (two) times daily.   LISINOPRIL (ZESTRIL) 2.5 MG TABLET    Take 1 tablet (2.5 mg total) by mouth daily.   MULTIPLE VITAMIN (MULTIVITAMIN WITH MINERALS) TABS TABLET    Take 1 tablet by mouth daily.   PANTOPRAZOLE (PROTONIX) 40 MG TABLET    Take 1 tablet (40 mg total) by mouth at bedtime.   POTASSIUM CHLORIDE (K-DUR) 10 MEQ TABLET    Take 1 tablet daily   SIMVASTATIN (ZOCOR) 40 MG TABLET    Take 1 tablet (40 mg total) by mouth every evening.  Modified Medications   No medications on file  Discontinued Medications   VANCOMYCIN IN SODIUM CHLORIDE 0.9 % 250 ML    Inject into the vein every 12  (twelve) hours.    Subjective: Mr. Harlin is in for his hospital followup visit. He suffered a fall while on Coumadin in May. He developed a subdural hematoma and underwent a craniotomy on May 18. He underwent redo surgery on May 22 with implantation of his bone flap in his right flank. Eventually he was discharged home and seemed to be improving with the help of physical therapy. His strength and mobility was improving but recently his wife began to note some increasing confusion. He underwent a CT scan which revealed a ring enhancing fluid collection at the operative site. He was readmitted and underwent surgery yesterday to drain a right temporal lobe abscess and subdural empyema. The initial Gram stain report was said to show gram-positive cocci in pairs but that has been corrected today to no organisms seen. Operative cultures were negative. He was started on empiric IV vancomycin, oral ciprofloxacin and oral metronidazole while hospitalized with the plan for 6 weeks of total therapy. On discharge the IV vancomycin was continued but for some reason he did not get prescriptions for ciprofloxacin and metronidazole. Fortunately he is continued to show steady improvement. He feels like he has more energy. His wife notes that he is more alert and less confused. He has not had any problems  tolerating his PICC or vancomycin. He has now completed 43 days of therapy.  Review of Systems: Pertinent items are noted in HPI.  Past Medical History  Diagnosis Date  . Systolic heart failure   . 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 Dyslipidemia   . HTN (hypertension)   . Atrial fibrillation   . Cancer     around nose  . Tobacco abuse   . Alcohol abuse   . H/O hiatal hernia     History  Substance Use Topics  . Smoking status: Former Smoker -- 1.00 packs/day for 50 years    Types: Cigarettes    Quit date: 04/19/2013  . Smokeless tobacco: Never Used  . Alcohol Use: 6.0 oz/week     10 Cans of beer per week     Comment: 4-5  12oz beers daily    Family History  Problem Relation Age of Onset  . Diabetes Mother   . Heart disease Mother   . Diabetes Father   . Heart disease Father   . Heart disease Brother     s/p CABG at 18  . Colon cancer Neg Hx   . Prostate cancer Neg Hx     No Known Allergies  Objective: Temp: 97.6 F (36.4 C) (08/25 1445) Temp src: Oral (08/25 1445) BP: 113/74 mmHg (08/25 1445) Pulse Rate: 125 (08/25 1445)  General: He is alert in no distress Skin: Right arm PICC site appears normal Lungs: Clear Cor: Regular S1 and S2 with no murmurs Head: Sunken right craniotomy site without evidence of active inflammation    Assessment: Although his antibiotics were narrowed on discharge he has done well and shows no signs of active infection at this time. I will stop vancomycin and have his PICC removed.  Plan: 1. Discontinue vancomycin 2. PICC removal 3. Followup in 6 weeks   Michel Bickers, MD [facility] for [place] [phone] pager   [phone] cell 07/27/2014, 3:22 PM

## 2014-08-26 ENCOUNTER — Other Ambulatory Visit: Payer: Self-pay | Admitting: Neurosurgery

## 2014-09-01 ENCOUNTER — Other Ambulatory Visit: Payer: Self-pay

## 2014-09-01 MED ORDER — SIMVASTATIN 40 MG PO TABS: 40.0000 mg | ORAL_TABLET | Freq: Every evening | ORAL | Status: DC

## 2014-09-06 ENCOUNTER — Encounter (HOSPITAL_COMMUNITY): Payer: Self-pay | Admitting: Pharmacy Technician

## 2014-09-06 NOTE — Progress Notes (Signed)
Notified Lorriane Shire that Dr. Saintclair Halsted needed to sign orders.

## 2014-09-06 NOTE — Pre-Procedure Instructions (Addendum)
CLIMMIE BUELOW  09/06/2014   Your procedure is scheduled on:  October 12  Report to [facility] Admitting at 11:00 AM.  Call this number if you have problems the morning of surgery: [phone]   Remember:   Do not eat food or drink liquids after midnight.   Take these medicines the morning of surgery with A SIP OF WATER: Carvedilol,    STOP Multiple Vitamins, Folic Acid 19/5/09   STOP/ Do not take Aspirin, Aleve, Naproxen, Advil, Ibuprofen, Motrin, Vitamins, Herbs, or Supplements starting 09/08/14   Do not wear jewelry, make-up or nail polish.  Do not wear lotions, powders, or perfumes. You may wear deodorant.  Do not shave 48 hours prior to surgery. Men may shave face and neck.  Do not bring valuables to the hospital.  [facility] is not responsible for any belongings or valuables.               Contacts, dentures or bridgework may not be worn into surgery.  Leave suitcase in the car. After surgery it may be brought to your room.  For patients admitted to the hospital, discharge time is determined by your treatment team.               Special Instructions: [place] - Preparing for Surgery  Before surgery, you can play an important role.  Because skin is not sterile, your skin needs to be as free of germs as possible.  You can reduce the number of germs on you skin by washing with CHG (chlorahexidine gluconate) soap before surgery.  CHG is an antiseptic cleaner which kills germs and bonds with the skin to continue killing germs even after washing.  Please DO NOT use if you have an allergy to CHG or antibacterial soaps.  If your skin becomes reddened/irritated stop using the CHG and inform your nurse when you arrive at Short Stay.  Do not shave (including legs and underarms) for at least 48 hours prior to the first CHG shower.  You may shave your face.  Please follow these instructions carefully:   1.  Shower with CHG Soap the night before surgery and the morning of  Surgery.  2.  If you choose to wash your hair, wash your hair first as usual with your normal shampoo.  3.  After you shampoo, rinse your hair and body thoroughly to remove the shampoo.  4.  Use CHG as you would any other liquid soap.  You can apply CHG directly to the skin and wash gently with scrungie or a clean washcloth.  5.  Apply the CHG Soap to your body ONLY FROM THE NECK DOWN.  Do not use on open wounds or open sores.  Avoid contact with your eyes, ears, mouth and genitals (private parts).  Wash genitals (private parts) with your normal soap.  6.  Wash thoroughly, paying special attention to the area where your surgery will be performed.  7.  Thoroughly rinse your body with warm water from the neck down.  8.  DO NOT shower/wash with your normal soap after using and rinsing off the CHG Soap.  9.  Pat yourself dry with a clean towel.            10.  Wear clean pajamas.            11.  Place clean sheets on your bed the night of your first shower and do not sleep with pets.  Day of Surgery  Do not apply any lotions the morning of surgery.  Please wear clean clothes to the hospital/surgery center.     Please read over the following fact sheets that you were given: Pain Booklet, Coughing and Deep Breathing, Blood Transfusion Information and Surgical Site Infection Prevention

## 2014-09-07 ENCOUNTER — Ambulatory Visit (HOSPITAL_COMMUNITY)
Admission: RE | Admit: 2014-09-07 | Discharge: 2014-09-07 | Disposition: A | Discharge status: Home / Self Care | Payer: BC Managed Care – PPO | Source: Ambulatory Visit | Attending: Anesthesiology | Admitting: Anesthesiology

## 2014-09-07 ENCOUNTER — Encounter (HOSPITAL_COMMUNITY)
Admission: RE | Admit: 2014-09-07 | Discharge: 2014-09-07 | Disposition: A | Discharge status: Home / Self Care | Payer: BC Managed Care – PPO | Source: Ambulatory Visit | Attending: Neurosurgery | Admitting: Neurosurgery

## 2014-09-07 ENCOUNTER — Encounter (HOSPITAL_COMMUNITY): Payer: Self-pay

## 2014-09-07 DIAGNOSIS — Z9889 Other specified postprocedural states: Secondary | ICD-10-CM | POA: Diagnosis not present

## 2014-09-07 DIAGNOSIS — Z01818 Encounter for other preprocedural examination: Secondary | ICD-10-CM | POA: Diagnosis not present

## 2014-09-07 DIAGNOSIS — G060 Intracranial abscess and granuloma: Secondary | ICD-10-CM | POA: Diagnosis not present

## 2014-09-07 HISTORY — DX: Cardiac arrhythmia, unspecified: I49.9

## 2014-09-07 HISTORY — DX: Cerebral infarction, unspecified: I63.9

## 2014-09-07 HISTORY — DX: Gastro-esophageal reflux disease without esophagitis: K21.9

## 2014-09-07 HISTORY — DX: Shortness of breath: R06.02

## 2014-09-07 HISTORY — DX: Acute myocardial infarction, unspecified: I21.9

## 2014-09-07 LAB — BASIC METABOLIC PANEL
Anion gap: 11 (ref 5–15)
BUN: 21 mg/dL (ref 6–23)
CHLORIDE: 101 meq/L (ref 96–112)
CO2: 25 mEq/L (ref 19–32)
Calcium: 9.5 mg/dL (ref 8.4–10.5)
Creatinine, Ser: 1.19 mg/dL (ref 0.50–1.35)
GFR calc Af Amer: 70 mL/min — ABNORMAL LOW (ref >90)
GFR calc non Af Amer: 61 mL/min — ABNORMAL LOW (ref >90)
GLUCOSE: 105 mg/dL — AB (ref 70–99)
POTASSIUM: 5.1 meq/L (ref 3.7–5.3)
Sodium: 137 mEq/L (ref 137–147)

## 2014-09-07 LAB — CBC
HCT: 33.7 % — ABNORMAL LOW (ref 39.0–52.0)
HEMOGLOBIN: 11.5 g/dL — AB (ref 13.0–17.0)
MCH: 28.3 pg (ref 26.0–34.0)
MCHC: 34.1 g/dL (ref 30.0–36.0)
MCV: 82.8 fL (ref 78.0–100.0)
Platelets: 198 10*3/uL (ref 150–400)
RBC: 4.07 MIL/uL — AB (ref 4.22–5.81)
RDW: 12.9 % (ref 11.5–15.5)
WBC: 7.2 10*3/uL (ref 4.0–10.5)

## 2014-09-07 LAB — SURGICAL PCR SCREEN
MRSA, PCR: NEGATIVE
Staphylococcus aureus: NEGATIVE

## 2014-09-07 NOTE — Progress Notes (Signed)
09/07/14 1020  OBSTRUCTIVE SLEEP APNEA  Have you ever been diagnosed with sleep apnea through a sleep study? No  Do you snore loudly (loud enough to be heard through closed doors)?  0  Do you often feel tired, fatigued, or sleepy during the daytime? 0  Has anyone observed you stop breathing during your sleep? 0 (not in yrs)  Do you have, or are you being treated for high blood pressure? 1  BMI more than 35 kg/m2? 1  Age over 69 years old? 1  Neck circumference greater than 40 cm/16 inches? 1 (20)  Gender: 1  Obstructive Sleep Apnea Score 5  Score 4 or greater  Results sent to PCP

## 2014-09-08 NOTE — Progress Notes (Addendum)
Anesthesia Chart Review:  Pt is 68 year old male scheduled for craniotomy bone flap/prosthetic plate on 40/08/67 with Dr. Saintclair Halsted.   PMH: CAD, CHF, MI, Stroke, CABG (2010), permanent atrial fibrillation, ischemic/nonischemic cardiomyopathy, HTN, dyslipidemia. 50 pack year smoker, quit May 2015. BMI 35  Medications include: carvediolol, lasix, lisinopril, simvastatin, protonix, prozac  Preoperative labs reviewed.  Hgb/Hct 11.5/33.7 which are consistent with previous results over past 3 months.   Chest x-ray reviewed. 1. 1.8 x 2.3 cm nodular density in the right mid lung is concerning  for potential neoplasm, but could alternatively be of infectious or  inflammatory etiology, or an area of scarring. Further evaluation with nonemergent noncontrast chest CT is recommended in the near future to better evaluate this finding.  2. Borderline cardiomegaly with pulmonary venous congestion, but no frank pulmonary edema.  3. Atherosclerosis.  EKG: Atrial fibrillation Left axis deviation Incomplete left bundle branch block No significant change since previous tracing.   Cardiac MRI 10/21/13 1) Normal LV size and function EF 52%  2) No hyperenhancement or infarct in LV myocardium  3) Mild to moderate LAE  4) Normal RV/RA  5) Mild MR  6) Prominent epicardial fat pad   2D echo 02/24/13: - Left ventricle: The cavity size was mildly dilated. Wall thickness was increased in a pattern of mild LVH. Systolic function was moderately to severely reduced. The estimated ejection fraction was in the range of 30% to 35%. - Left atrium: The atrium was mildly dilated. - Right ventricle: The cavity size was mildly dilated. Systolic function was mildly reduced.  Pt sustained a fall in May 2015 resulting in subdural hematoma requiring craniotomy and evacuation. Had surgery again in July 2015 for intracerebral abscess and subdural empyema. He is no longer a candidate for anticoagulation.   Reviewed case with Dr.  Orene Desanctis.   Spoke with Lorriane Shire in Dr. Windy Carina office about abnormal nodular density on CXR.   If no changes, I anticipate pt can proceed with surgery as scheduled.   Willeen Cass, FNP-BC [facility]/Anesthesiology Phone: [phone] 09/08/2014 3:03 PM

## 2014-09-09 ENCOUNTER — Ambulatory Visit: Payer: BC Managed Care – PPO | Admitting: Internal Medicine

## 2014-09-10 NOTE — Progress Notes (Signed)
Arshawn Valdez (pt wife) called and informed of time change.  She states that Dr Windy Carina office has already called.  Instructed to be here at 0900. Teach back noted and voices understanding.Marland Kitchen

## 2014-09-12 MED ORDER — CEFAZOLIN SODIUM-DEXTROSE 2-3 GM-% IV SOLR
2.0000 g | INTRAVENOUS | Status: AC
  Administered 2014-09-13: 2 g via INTRAVENOUS
  Filled 2014-09-12: qty 50

## 2014-09-12 MED ORDER — DEXAMETHASONE SODIUM PHOSPHATE 10 MG/ML IJ SOLN
10.0000 mg | INTRAMUSCULAR | Status: AC
  Administered 2014-09-13: 10 mg via INTRAVENOUS
  Filled 2014-09-12: qty 1

## 2014-09-13 ENCOUNTER — Encounter (HOSPITAL_COMMUNITY): Payer: BC Managed Care – PPO | Admitting: Vascular Surgery

## 2014-09-13 ENCOUNTER — Encounter (HOSPITAL_COMMUNITY)
Admission: RE | Disposition: A | Discharge status: Home / Self Care | Payer: Self-pay | Source: Ambulatory Visit | Attending: Neurosurgery

## 2014-09-13 ENCOUNTER — Inpatient Hospital Stay (HOSPITAL_COMMUNITY)
Admission: RE | Admit: 2014-09-13 | Discharge: 2014-09-16 | DRG: 517 | Disposition: A | Discharge status: Home / Self Care | Payer: BC Managed Care – PPO | Source: Ambulatory Visit | Attending: Neurosurgery | Admitting: Neurosurgery

## 2014-09-13 ENCOUNTER — Other Ambulatory Visit: Payer: Self-pay | Admitting: Neurosurgery

## 2014-09-13 ENCOUNTER — Ambulatory Visit (HOSPITAL_COMMUNITY): Payer: BC Managed Care – PPO | Admitting: Critical Care Medicine

## 2014-09-13 ENCOUNTER — Encounter (HOSPITAL_COMMUNITY): Payer: Self-pay | Admitting: *Deleted

## 2014-09-13 DIAGNOSIS — M952 Other acquired deformity of head: Secondary | ICD-10-CM | POA: Diagnosis present

## 2014-09-13 DIAGNOSIS — Z481 Encounter for planned postprocedural wound closure: Secondary | ICD-10-CM | POA: Diagnosis present

## 2014-09-13 DIAGNOSIS — Z8661 Personal history of infections of the central nervous system: Secondary | ICD-10-CM

## 2014-09-13 DIAGNOSIS — Z9889 Other specified postprocedural states: Secondary | ICD-10-CM

## 2014-09-13 DIAGNOSIS — I10 Essential (primary) hypertension: Secondary | ICD-10-CM | POA: Diagnosis present

## 2014-09-13 DIAGNOSIS — Z9009 Acquired absence of other part of head and neck: Secondary | ICD-10-CM | POA: Diagnosis present

## 2014-09-13 DIAGNOSIS — R911 Solitary pulmonary nodule: Secondary | ICD-10-CM

## 2014-09-13 HISTORY — PX: CRANIOTOMY: SHX93

## 2014-09-13 LAB — TYPE AND SCREEN
ABO/RH(D): O NEG
Antibody Screen: NEGATIVE

## 2014-09-13 SURGERY — CRANIOTOMY BONE FLAP/PROSTHETIC PLATE
Anesthesia: General

## 2014-09-13 MED ORDER — LABETALOL HCL 5 MG/ML IV SOLN
INTRAVENOUS | Status: AC
  Filled 2014-09-13: qty 4

## 2014-09-13 MED ORDER — ONDANSETRON HCL 4 MG/2ML IJ SOLN: 4.0000 mg | Freq: Once | INTRAMUSCULAR | Status: DC | PRN

## 2014-09-13 MED ORDER — PROMETHAZINE HCL 25 MG PO TABS: 12.5000 mg | ORAL_TABLET | ORAL | Status: DC | PRN

## 2014-09-13 MED ORDER — FUROSEMIDE 40 MG PO TABS
40.0000 mg | ORAL_TABLET | Freq: Every day | ORAL | Status: DC
  Administered 2014-09-13 – 2014-09-16 (×4): 40 mg via ORAL
  Filled 2014-09-13 (×5): qty 1

## 2014-09-13 MED ORDER — SIMVASTATIN 40 MG PO TABS
40.0000 mg | ORAL_TABLET | Freq: Every evening | ORAL | Status: DC
  Administered 2014-09-13 – 2014-09-15 (×3): 40 mg via ORAL
  Filled 2014-09-13 (×4): qty 1

## 2014-09-13 MED ORDER — CEFAZOLIN SODIUM 1-5 GM-% IV SOLN
1.0000 g | Freq: Three times a day (TID) | INTRAVENOUS | Status: AC
  Administered 2014-09-13 – 2014-09-15 (×6): 1 g via INTRAVENOUS
  Filled 2014-09-13 (×7): qty 50

## 2014-09-13 MED ORDER — 0.9 % SODIUM CHLORIDE (POUR BTL) OPTIME
TOPICAL | Status: DC | PRN
  Administered 2014-09-13 (×2): 1000 mL

## 2014-09-13 MED ORDER — OXYCODONE HCL 5 MG PO TABS: 5.0000 mg | ORAL_TABLET | Freq: Once | ORAL | Status: DC | PRN

## 2014-09-13 MED ORDER — ONDANSETRON HCL 4 MG/2ML IJ SOLN: 4.0000 mg | INTRAMUSCULAR | Status: DC | PRN

## 2014-09-13 MED ORDER — POTASSIUM CHLORIDE IN NACL 20-0.45 MEQ/L-% IV SOLN
INTRAVENOUS | Status: DC
  Administered 2014-09-13 – 2014-09-16 (×5): via INTRAVENOUS
  Filled 2014-09-13 (×8): qty 1000

## 2014-09-13 MED ORDER — PANTOPRAZOLE SODIUM 40 MG PO TBEC
40.0000 mg | DELAYED_RELEASE_TABLET | Freq: Every day | ORAL | Status: DC
  Administered 2014-09-13 – 2014-09-15 (×3): 40 mg via ORAL
  Filled 2014-09-13 (×3): qty 1

## 2014-09-13 MED ORDER — HYDROMORPHONE HCL 1 MG/ML IJ SOLN: 0.2500 mg | INTRAMUSCULAR | Status: DC | PRN

## 2014-09-13 MED ORDER — NEOSTIGMINE METHYLSULFATE 10 MG/10ML IV SOLN
INTRAVENOUS | Status: AC
  Filled 2014-09-13: qty 1

## 2014-09-13 MED ORDER — LEVETIRACETAM 500 MG PO TABS: 500.0000 mg | ORAL_TABLET | Freq: Two times a day (BID) | ORAL | Status: DC

## 2014-09-13 MED ORDER — GLYCOPYRROLATE 0.2 MG/ML IJ SOLN
INTRAMUSCULAR | Status: AC
  Filled 2014-09-13: qty 3

## 2014-09-13 MED ORDER — PROPOFOL 10 MG/ML IV BOLUS
INTRAVENOUS | Status: DC | PRN
  Administered 2014-09-13: 100 mg via INTRAVENOUS
  Administered 2014-09-13: 40 mg via INTRAVENOUS

## 2014-09-13 MED ORDER — FLUOXETINE HCL 20 MG PO TABS
20.0000 mg | ORAL_TABLET | Freq: Every day | ORAL | Status: DC
  Filled 2014-09-13: qty 1

## 2014-09-13 MED ORDER — FENTANYL CITRATE 0.05 MG/ML IJ SOLN
INTRAMUSCULAR | Status: AC
  Filled 2014-09-13: qty 5

## 2014-09-13 MED ORDER — PHENYLEPHRINE HCL 10 MG/ML IJ SOLN
10.0000 mg | INTRAVENOUS | Status: DC | PRN
  Administered 2014-09-13: 50 ug/min via INTRAVENOUS

## 2014-09-13 MED ORDER — SODIUM CHLORIDE 0.9 % IR SOLN
Status: DC | PRN
  Administered 2014-09-13: 12:00:00

## 2014-09-13 MED ORDER — LEVETIRACETAM IN NACL 500 MG/100ML IV SOLN
500.0000 mg | Freq: Two times a day (BID) | INTRAVENOUS | Status: DC
  Administered 2014-09-13 – 2014-09-16 (×6): 500 mg via INTRAVENOUS
  Filled 2014-09-13 (×7): qty 100

## 2014-09-13 MED ORDER — VECURONIUM BROMIDE 10 MG IV SOLR
INTRAVENOUS | Status: DC | PRN
  Administered 2014-09-13: 2 mg via INTRAVENOUS

## 2014-09-13 MED ORDER — MORPHINE SULFATE 2 MG/ML IJ SOLN: 1.0000 mg | INTRAMUSCULAR | Status: DC | PRN

## 2014-09-13 MED ORDER — LIDOCAINE HCL (CARDIAC) 20 MG/ML IV SOLN
INTRAVENOUS | Status: DC | PRN
  Administered 2014-09-13: 100 mg via INTRAVENOUS

## 2014-09-13 MED ORDER — MICROFIBRILLAR COLL HEMOSTAT EX PADS
MEDICATED_PAD | CUTANEOUS | Status: DC | PRN
  Administered 2014-09-13: 1 via TOPICAL

## 2014-09-13 MED ORDER — LABETALOL HCL 5 MG/ML IV SOLN
10.0000 mg | INTRAVENOUS | Status: DC | PRN
  Administered 2014-09-13: 20 mg via INTRAVENOUS
  Administered 2014-09-13 (×2): 5 mg via INTRAVENOUS
  Administered 2014-09-13: 10 mg via INTRAVENOUS
  Filled 2014-09-13: qty 8
  Filled 2014-09-13 (×2): qty 4

## 2014-09-13 MED ORDER — FENTANYL CITRATE 0.05 MG/ML IJ SOLN
INTRAMUSCULAR | Status: DC | PRN
  Administered 2014-09-13 (×2): 50 ug via INTRAVENOUS
  Administered 2014-09-13: 100 ug via INTRAVENOUS
  Administered 2014-09-13: 50 ug via INTRAVENOUS

## 2014-09-13 MED ORDER — LACTATED RINGERS IV SOLN
INTRAVENOUS | Status: DC
  Administered 2014-09-13: 10:00:00 via INTRAVENOUS

## 2014-09-13 MED ORDER — ONDANSETRON HCL 4 MG/2ML IJ SOLN
INTRAMUSCULAR | Status: AC
  Filled 2014-09-13: qty 2

## 2014-09-13 MED ORDER — LISINOPRIL 2.5 MG PO TABS
2.5000 mg | ORAL_TABLET | Freq: Every day | ORAL | Status: DC
  Administered 2014-09-13 – 2014-09-16 (×4): 2.5 mg via ORAL
  Filled 2014-09-13 (×4): qty 1

## 2014-09-13 MED ORDER — LIDOCAINE-EPINEPHRINE 1 %-1:100000 IJ SOLN
INTRAMUSCULAR | Status: DC | PRN
  Administered 2014-09-13 (×2): 7 mL

## 2014-09-13 MED ORDER — POTASSIUM CHLORIDE ER 10 MEQ PO TBCR
10.0000 meq | EXTENDED_RELEASE_TABLET | Freq: Every day | ORAL | Status: DC
  Administered 2014-09-13 – 2014-09-16 (×4): 10 meq via ORAL
  Filled 2014-09-13 (×6): qty 1

## 2014-09-13 MED ORDER — NEOSTIGMINE METHYLSULFATE 10 MG/10ML IV SOLN
INTRAVENOUS | Status: DC | PRN
  Administered 2014-09-13: 4 mg via INTRAVENOUS

## 2014-09-13 MED ORDER — ONDANSETRON HCL 4 MG PO TABS: 4.0000 mg | ORAL_TABLET | ORAL | Status: DC | PRN

## 2014-09-13 MED ORDER — PANTOPRAZOLE SODIUM 40 MG IV SOLR: 40.0000 mg | Freq: Every day | INTRAVENOUS | Status: DC

## 2014-09-13 MED ORDER — ACETAMINOPHEN 650 MG RE SUPP: 650.0000 mg | RECTAL | Status: DC | PRN

## 2014-09-13 MED ORDER — OXYCODONE HCL 5 MG/5ML PO SOLN: 5.0000 mg | Freq: Once | ORAL | Status: DC | PRN

## 2014-09-13 MED ORDER — DOCUSATE SODIUM 100 MG PO CAPS
100.0000 mg | ORAL_CAPSULE | Freq: Two times a day (BID) | ORAL | Status: DC
  Administered 2014-09-13 – 2014-09-16 (×6): 100 mg via ORAL
  Filled 2014-09-13 (×8): qty 1

## 2014-09-13 MED ORDER — ONDANSETRON HCL 4 MG/2ML IJ SOLN
INTRAMUSCULAR | Status: DC | PRN
  Administered 2014-09-13: 4 mg via INTRAVENOUS

## 2014-09-13 MED ORDER — ARTIFICIAL TEARS OP OINT
TOPICAL_OINTMENT | OPHTHALMIC | Status: DC | PRN
  Administered 2014-09-13: 1 via OPHTHALMIC

## 2014-09-13 MED ORDER — STERILE WATER FOR INJECTION IJ SOLN
INTRAMUSCULAR | Status: AC
  Filled 2014-09-13: qty 10

## 2014-09-13 MED ORDER — VECURONIUM BROMIDE 10 MG IV SOLR
INTRAVENOUS | Status: AC
  Filled 2014-09-13: qty 10

## 2014-09-13 MED ORDER — GLYCOPYRROLATE 0.2 MG/ML IJ SOLN
INTRAMUSCULAR | Status: DC | PRN
  Administered 2014-09-13: 0.6 mg via INTRAVENOUS

## 2014-09-13 MED ORDER — ESMOLOL HCL 10 MG/ML IV SOLN
INTRAVENOUS | Status: DC | PRN
  Administered 2014-09-13 (×2): 20 mg via INTRAVENOUS

## 2014-09-13 MED ORDER — ADULT MULTIVITAMIN W/MINERALS CH
1.0000 | ORAL_TABLET | Freq: Every day | ORAL | Status: DC
  Administered 2014-09-13 – 2014-09-16 (×4): 1 via ORAL
  Filled 2014-09-13 (×4): qty 1

## 2014-09-13 MED ORDER — CARVEDILOL 12.5 MG PO TABS
25.0000 mg | ORAL_TABLET | Freq: Two times a day (BID) | ORAL | Status: DC
  Administered 2014-09-13 – 2014-09-16 (×6): 25 mg via ORAL
  Filled 2014-09-13: qty 2
  Filled 2014-09-13: qty 1
  Filled 2014-09-13: qty 2
  Filled 2014-09-13 (×2): qty 1
  Filled 2014-09-13 (×2): qty 2
  Filled 2014-09-13: qty 1

## 2014-09-13 MED ORDER — FLUOXETINE HCL 20 MG PO CAPS
20.0000 mg | ORAL_CAPSULE | Freq: Every day | ORAL | Status: DC
  Administered 2014-09-13 – 2014-09-16 (×4): 20 mg via ORAL
  Filled 2014-09-13 (×4): qty 1

## 2014-09-13 MED ORDER — FOLIC ACID 1 MG PO TABS
1.0000 mg | ORAL_TABLET | Freq: Every day | ORAL | Status: DC
  Administered 2014-09-13 – 2014-09-16 (×4): 1 mg via ORAL
  Filled 2014-09-13 (×4): qty 1

## 2014-09-13 MED ORDER — PROPOFOL 10 MG/ML IV BOLUS
INTRAVENOUS | Status: AC
  Filled 2014-09-13: qty 20

## 2014-09-13 MED ORDER — ROCURONIUM BROMIDE 100 MG/10ML IV SOLN
INTRAVENOUS | Status: DC | PRN
  Administered 2014-09-13: 40 mg via INTRAVENOUS
  Administered 2014-09-13: 10 mg via INTRAVENOUS

## 2014-09-13 MED ORDER — MEPERIDINE HCL 25 MG/ML IJ SOLN: 6.2500 mg | INTRAMUSCULAR | Status: DC | PRN

## 2014-09-13 MED ORDER — THROMBIN 20000 UNITS EX SOLR
CUTANEOUS | Status: DC | PRN
  Administered 2014-09-13: 12:00:00 via TOPICAL

## 2014-09-13 MED ORDER — ACETAMINOPHEN 325 MG PO TABS
650.0000 mg | ORAL_TABLET | ORAL | Status: DC | PRN
  Administered 2014-09-13: 650 mg via ORAL
  Filled 2014-09-13: qty 2

## 2014-09-13 MED ORDER — HYDROCODONE-ACETAMINOPHEN 5-325 MG PO TABS
1.0000 | ORAL_TABLET | ORAL | Status: DC | PRN
  Administered 2014-09-13 – 2014-09-14 (×3): 1 via ORAL
  Filled 2014-09-13 (×3): qty 1

## 2014-09-13 MED ORDER — ARTIFICIAL TEARS OP OINT
TOPICAL_OINTMENT | OPHTHALMIC | Status: AC
  Filled 2014-09-13: qty 3.5

## 2014-09-13 SURGICAL SUPPLY — 96 items
BAG DECANTER FOR FLEXI CONT (MISCELLANEOUS) ×3 IMPLANT
BANDAGE GAUZE 4  KLING STR (GAUZE/BANDAGES/DRESSINGS) ×3 IMPLANT
BENZOIN TINCTURE PRP APPL 2/3 (GAUZE/BANDAGES/DRESSINGS) ×3 IMPLANT
BLADE CLIPPER SURG (BLADE) ×3 IMPLANT
BNDG COHESIVE 4X5 TAN NS LF (GAUZE/BANDAGES/DRESSINGS) IMPLANT
BNDG GAUZE ELAST 4 BULKY (GAUZE/BANDAGES/DRESSINGS) ×3 IMPLANT
BRUSH SCRUB EZ 1% IODOPHOR (MISCELLANEOUS) IMPLANT
BRUSH SCRUB EZ PLAIN DRY (MISCELLANEOUS) ×3 IMPLANT
BUR ROUTER D-58 CRANI (BURR) IMPLANT
CANISTER SUCT 3000ML (MISCELLANEOUS) ×3 IMPLANT
CLIP TI MEDIUM 6 (CLIP) IMPLANT
CLOSURE WOUND 1/2 X4 (GAUZE/BANDAGES/DRESSINGS) ×2
CONT SPEC 4OZ CLIKSEAL STRL BL (MISCELLANEOUS) IMPLANT
CORDS BIPOLAR (ELECTRODE) ×3 IMPLANT
DECANTER SPIKE VIAL GLASS SM (MISCELLANEOUS) ×3 IMPLANT
DERMABOND ADVANCED (GAUZE/BANDAGES/DRESSINGS) ×2
DERMABOND ADVANCED .7 DNX12 (GAUZE/BANDAGES/DRESSINGS) ×1 IMPLANT
DRAIN CHANNEL 7F 3/4 FLAT (WOUND CARE) ×3 IMPLANT
DRAIN SNY WOU 7FLT (WOUND CARE) IMPLANT
DRAPE INCISE IOBAN 66X45 STRL (DRAPES) IMPLANT
DRAPE NEUROLOGICAL W/INCISE (DRAPES) ×3 IMPLANT
DRAPE SURG 17X23 STRL (DRAPES) ×6 IMPLANT
DRSG OPSITE 4X5.5 SM (GAUZE/BANDAGES/DRESSINGS) ×9 IMPLANT
DRSG OPSITE POSTOP 4X6 (GAUZE/BANDAGES/DRESSINGS) ×3 IMPLANT
DRSG TELFA 3X8 NADH (GAUZE/BANDAGES/DRESSINGS) ×3 IMPLANT
ELECT CAUTERY BLADE 6.4 (BLADE) ×3 IMPLANT
ELECT REM PT RETURN 9FT ADLT (ELECTROSURGICAL) ×3
ELECTRODE REM PT RTRN 9FT ADLT (ELECTROSURGICAL) ×1 IMPLANT
EVACUATOR SILICONE 100CC (DRAIN) ×3 IMPLANT
GAUZE SPONGE 4X4 12PLY STRL (GAUZE/BANDAGES/DRESSINGS) ×3 IMPLANT
GAUZE SPONGE 4X4 16PLY XRAY LF (GAUZE/BANDAGES/DRESSINGS) IMPLANT
GLOVE BIO SURGEON STRL SZ 6.5 (GLOVE) IMPLANT
GLOVE BIO SURGEON STRL SZ7 (GLOVE) IMPLANT
GLOVE BIO SURGEON STRL SZ7.5 (GLOVE) IMPLANT
GLOVE BIO SURGEON STRL SZ8 (GLOVE) ×6 IMPLANT
GLOVE BIO SURGEON STRL SZ8.5 (GLOVE) IMPLANT
GLOVE BIO SURGEONS STRL SZ 6.5 (GLOVE)
GLOVE BIOGEL M 8.0 STRL (GLOVE) IMPLANT
GLOVE BIOGEL PI IND STRL 7.0 (GLOVE) ×1 IMPLANT
GLOVE BIOGEL PI IND STRL 7.5 (GLOVE) ×1 IMPLANT
GLOVE BIOGEL PI IND STRL 8 (GLOVE) ×1 IMPLANT
GLOVE BIOGEL PI INDICATOR 7.0 (GLOVE) ×2
GLOVE BIOGEL PI INDICATOR 7.5 (GLOVE) ×2
GLOVE BIOGEL PI INDICATOR 8 (GLOVE) ×2
GLOVE ECLIPSE 6.5 STRL STRAW (GLOVE) IMPLANT
GLOVE ECLIPSE 7.0 STRL STRAW (GLOVE) IMPLANT
GLOVE ECLIPSE 7.5 STRL STRAW (GLOVE) ×3 IMPLANT
GLOVE ECLIPSE 8.0 STRL XLNG CF (GLOVE) IMPLANT
GLOVE ECLIPSE 8.5 STRL (GLOVE) IMPLANT
GLOVE EXAM NITRILE LRG STRL (GLOVE) IMPLANT
GLOVE EXAM NITRILE MD LF STRL (GLOVE) IMPLANT
GLOVE EXAM NITRILE XL STR (GLOVE) IMPLANT
GLOVE EXAM NITRILE XS STR PU (GLOVE) IMPLANT
GLOVE INDICATOR 8.5 STRL (GLOVE) ×6 IMPLANT
GLOVE OPTIFIT SS 8.0 STRL (GLOVE) IMPLANT
GLOVE SS BIOGEL STRL SZ 6.5 (GLOVE) ×1 IMPLANT
GLOVE SUPERSENSE BIOGEL SZ 6.5 (GLOVE) ×2
GLOVE SURG SS PI 6.5 STRL IVOR (GLOVE) IMPLANT
GOWN STRL REUS W/ TWL LRG LVL3 (GOWN DISPOSABLE) ×2 IMPLANT
GOWN STRL REUS W/ TWL XL LVL3 (GOWN DISPOSABLE) ×2 IMPLANT
GOWN STRL REUS W/TWL 2XL LVL3 (GOWN DISPOSABLE) ×3 IMPLANT
GOWN STRL REUS W/TWL LRG LVL3 (GOWN DISPOSABLE) ×4
GOWN STRL REUS W/TWL XL LVL3 (GOWN DISPOSABLE) ×4
HEMOSTAT SURGICEL 2X14 (HEMOSTASIS) IMPLANT
KIT BASIN OR (CUSTOM PROCEDURE TRAY) ×3 IMPLANT
KIT ROOM TURNOVER OR (KITS) ×3 IMPLANT
NEEDLE HYPO 22GX1.5 SAFETY (NEEDLE) ×3 IMPLANT
NS IRRIG 1000ML POUR BTL (IV SOLUTION) ×6 IMPLANT
PACK CRANIOTOMY (CUSTOM PROCEDURE TRAY) ×3 IMPLANT
PAD ARMBOARD 7.5X6 YLW CONV (MISCELLANEOUS) ×9 IMPLANT
PATTIES SURGICAL .25X.25 (GAUZE/BANDAGES/DRESSINGS) IMPLANT
PATTIES SURGICAL .5 X.5 (GAUZE/BANDAGES/DRESSINGS) IMPLANT
PATTIES SURGICAL .5 X3 (DISPOSABLE) IMPLANT
PATTIES SURGICAL .75X.75 (GAUZE/BANDAGES/DRESSINGS) IMPLANT
PATTIES SURGICAL 1X1 (DISPOSABLE) IMPLANT
PIN MAYFIELD SKULL DISP (PIN) IMPLANT
PLATE DOUBLE 6 HOLE (Plate) ×3 IMPLANT
SCREW SELF DRILL HT 1.5/4MM (Screw) ×27 IMPLANT
SPONGE NEURO XRAY DETECT 1X3 (DISPOSABLE) IMPLANT
SPONGE SURGIFOAM ABS GEL 100 (HEMOSTASIS) IMPLANT
SPONGE SURGIFOAM ABS GEL SZ50 (HEMOSTASIS) IMPLANT
STAPLER SKIN PROX WIDE 3.9 (STAPLE) ×6 IMPLANT
STRIP CLOSURE SKIN 1/2X4 (GAUZE/BANDAGES/DRESSINGS) ×4 IMPLANT
SUT NURALON 4 0 TR CR/8 (SUTURE) IMPLANT
SUT VIC AB 1 CT1 18XBRD ANBCTR (SUTURE) ×1 IMPLANT
SUT VIC AB 1 CT1 8-18 (SUTURE) ×2
SUT VIC AB 2-0 CT1 18 (SUTURE) ×12 IMPLANT
SUT VIC AB 3-0 SH 8-18 (SUTURE) ×6 IMPLANT
SUT VICRYL 4-0 PS2 18IN ABS (SUTURE) ×3 IMPLANT
SYR 20ML ECCENTRIC (SYRINGE) ×3 IMPLANT
SYR CONTROL 10ML LL (SYRINGE) ×3 IMPLANT
TOWEL OR 17X24 6PK STRL BLUE (TOWEL DISPOSABLE) ×6 IMPLANT
TOWEL OR 17X26 10 PK STRL BLUE (TOWEL DISPOSABLE) ×3 IMPLANT
TRAY FOLEY CATH 14FRSI W/METER (CATHETERS) IMPLANT
UNDERPAD 30X30 INCONTINENT (UNDERPADS AND DIAPERS) IMPLANT
WATER STERILE IRR 1000ML POUR (IV SOLUTION) ×3 IMPLANT

## 2014-09-13 NOTE — Transfer of Care (Signed)
Immediate Anesthesia Transfer of Care Note  Patient: Alejandro [name]  Procedure(s) Performed: Procedure(s): CRANIOTOMY BONE FLAP/PROSTHETIC PLATE (N/A)  Patient Location: PACU  Anesthesia Type:General  Level of Consciousness: awake and alert   Airway & Oxygen Therapy: Patient Spontanous Breathing and Patient connected to nasal cannula oxygen  Post-op Assessment: Report given to PACU RN, Post -op Vital signs reviewed and stable and Patient moving all extremities X 4  Post vital signs: Reviewed and stable  Complications: No apparent anesthesia complications

## 2014-09-13 NOTE — Op Note (Signed)
Preoperative diagnosis: Cranial defect   postoperative diagnosis: Same  Procedure: Cranioplasty involving Reimplantation of right pterional craniotomy flap that had been previously implanted into the abdominal wall.  Surgeon: Dominica Severin Chyrl Elwell  Assistant: Ashok Pall  Anesthesia: Gen.  EBL: Minimal  History of present illness: Patient is a very pleasant 68 year old gentleman previous craniotomy with implantation of the right pterional craniotomy flap into the abdominal wall subsequently the patient developed an intracranial infection subdural empyema and intracerebral abscess this was debrided washed out patient is placed on IV antibiotics once the patient was cleared of his infection we recommended reimplantation of his flap approximately 4 months post I&D. I extensively reviewed the risks and benefits of the operation with the patient as well as perioperative course expectations of outcome and alternatives of surgery and he understood and agreed to proceed forward.  Operative procedure: Patient was induced under general anesthesia and positioned supine with a shoulder bump under his right shoulder his head turned to the left exposing the right side of his calvarium his head was shaved prepped and draped in routine sterile fashion as well well as his right lower quadrant abdominal wall incision. After adequate prepping and infiltration 10 cc lidocaine with epi I first opened up the cranial flap identified the dural plane and dissected the scalp flap off over the dural plane exposing the bony edges. After adequate exposure of the craniotomy defect was achieved I packed this with saturated sponges opened up the abdominal wall cavity harvested the cranial flap no sign of infection in the abdominal wall cranial flap site. Copiously irrigated the abdominal wall and closed that with interrupted Vicryl and subcutaneous Vicryl Dermabond and Steri-Strips. I then aligned in the cranial flap in the craniotomy defect  added one additional plate and this was reattached with the Biomet plating system the wounds and copiously irrigated meticulous in space was maintained a J-P drain was placed subgaleal and the galea was reapproximated with interrupted Vicryl and staples in the skin the head was then dressed and wrapped and patient recovered in stable condition. At the end of case all needle counts sponge counts were correct.

## 2014-09-13 NOTE — Anesthesia Procedure Notes (Signed)
Procedure Name: Intubation Date/Time: 09/13/2014 11:04 AM Performed by: Carola Frost Pre-anesthesia Checklist: Patient identified, Timeout performed, Emergency Drugs available, Suction available and Patient being monitored Patient Re-evaluated:Patient Re-evaluated prior to inductionOxygen Delivery Method: Circle system utilized Preoxygenation: Pre-oxygenation with 100% oxygen Intubation Type: IV induction Ventilation: Mask ventilation without difficulty Laryngoscope Size: Mac and 4 Grade View: Grade I Tube type: Oral Tube size: 7.5 mm Number of attempts: 1 Airway Equipment and Method: Stylet Placement Confirmation: CO2 detector,  positive ETCO2,  ETT inserted through vocal cords under direct vision and breath sounds checked- equal and bilateral Secured at: 24 cm Tube secured with: Tape Dental Injury: Teeth and Oropharynx as per pre-operative assessment

## 2014-09-13 NOTE — Anesthesia Postprocedure Evaluation (Signed)
Anesthesia Post Note  Patient: Alejandro [name]  Procedure(s) Performed: Procedure(s) (LRB): CRANIOTOMY BONE FLAP/PROSTHETIC PLATE (N/A)  Anesthesia type: general  Patient location: PACU  Post pain: Pain level controlled  Post assessment: Patient's Cardiovascular Status Stable  Last Vitals:  Filed Vitals:   09/13/14 1430  BP: 150/65  Pulse: 83  Temp: 36.4 C  Resp: 20    Post vital signs: Reviewed and stable  Level of consciousness: sedated  Complications: No apparent anesthesia complications

## 2014-09-13 NOTE — H&P (Signed)
Alejandro [name] is an 68 y.o. male.   Chief Complaint: Cranial defect HPI: Patient is a 68 year old gentleman who underwent craniotomy for subdural hematoma with implantation of bone flap and belly subsequent patient developed an infection and intracerebral abscess underwent reexploration of his cranial wound for aspiration of abscess and IV antibiotics the abscesses treated after several weeks of IV antibiotics the bone flap and belly wound never got infected so now the patient stable from his infection perspective and presents for replantation of his cranial the flap. I've extensor reviewed the risks and benefits of the operation the patient as well as perioperative course and expectations of outcome and alternatives of surgery he understands and agrees to proceed forward.  Past Medical History  Diagnosis Date  . Systolic heart failure   . 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 Dyslipidemia   . HTN (hypertension)   . Atrial fibrillation   . Cancer     around nose  . Tobacco abuse   . Alcohol abuse   . H/O hiatal hernia   . Myocardial infarction   . Dysrhythmia     atrial fib  . Stroke 5/15  . CHF (congestive heart failure)   . Shortness of breath     occ  . GERD (gastroesophageal reflux disease)     Past Surgical History  Procedure Laterality Date  . Umbilical hernia repair  2010  . Hernia repair  2012  . Ventral hernia repair N/A 01/22/2013    Procedure: HERNIA REPAIR VENTRAL ADULT;  Surgeon: [name], MD;  Location: [place];  Service: General;  Laterality: N/A;  . Application of a-cell of chest/abdomen N/A 01/22/2013    Procedure: APPLICATION OF A-CELL OF CHEST/ABDOMEN;  Surgeon: [name], MD;  Location: [place];  Service: General;  Laterality: N/A;  . Cystoscopy N/A 01/22/2013    Procedure: Consuela Mimes;  Surgeon: [name], MD;  Location: [place];  Service: Urology;  Laterality: N/A;  Cystoscopy with balloon dilation. Insertion of coude catheter.   . Craniotomy Right 04/19/2014    Procedure: CRANIOTOMY HEMATOMA EVACUATION SUBDURAL;  Surgeon: [name], MD;  Location: [place] [facility];  Service: Neurosurgery;  Laterality: Right;  right  . Craniotomy Right 04/23/2014    Procedure: Craniotomy for Intracerebral Hemorrhage;  Surgeon: [name], MD;  Location: [place] [facility];  Service: Neurosurgery;  Laterality: Right;  . Craniotomy N/A 06/16/2014    Procedure: Craniotomy for intracerebral abscess and subdural empyema;  Surgeon: [name], MD;  Location: [place] [facility];  Service: Neurosurgery;  Laterality: N/A;  . Coronary artery bypass graft  12/24/2008    4 vessel  . Coronary artery bypass graft  2012?    Family History  Problem Relation Age of Onset  . Diabetes Mother   . Heart disease Mother   . Diabetes Father   . Heart disease Father   . Heart disease Brother     s/p CABG at 54  . Colon cancer Neg Hx   . Prostate cancer Neg Hx    Social History:  reports that he quit smoking about 16 months ago. His smoking use included Cigarettes. He has a 50 pack-year smoking history. He has never used smokeless tobacco. He reports that he drinks alcohol. He reports that he does not use illicit drugs.  Allergies: No Known Allergies  Medications Prior to Admission  Medication Sig Dispense Refill  . carvedilol (COREG) 12.5 MG  tablet Take 25 mg by mouth 2 (two) times daily with a meal.      . FLUoxetine (PROZAC) 20 MG tablet Take 1 tablet (20 mg total) by mouth daily.  30 tablet  6  . folic acid (FOLVITE) 1 MG tablet Take 1 tablet (1 mg total) by mouth daily.  30 tablet  3  . furosemide (LASIX) 40 MG tablet Take 1 tablet (40 mg total) by mouth daily.  90 tablet  1  . levETIRAcetam (KEPPRA) 500 MG tablet Take 1 tablet (500 mg total) by mouth 2 (two) times daily.  60 tablet  6  . lisinopril (ZESTRIL) 2.5 MG tablet Take 1 tablet (2.5 mg total) by mouth daily.  30 tablet  6  . Multiple Vitamin (MULTIVITAMIN WITH MINERALS) TABS tablet Take 1 tablet by  mouth daily.      . pantoprazole (PROTONIX) 40 MG tablet Take 1 tablet (40 mg total) by mouth at bedtime.  30 tablet  6  . potassium chloride (K-DUR) 10 MEQ tablet Take 1 tablet daily  30 tablet  3  . simvastatin (ZOCOR) 40 MG tablet Take 1 tablet (40 mg total) by mouth every evening.  30 tablet  4    Results for orders placed during the hospital encounter of 09/13/14 (from the past 48 hour(s))  TYPE AND SCREEN     Status: None   Collection Time    09/13/14  9:20 AM      Result Value Ref Range   ABO/RH(D) O NEG     Antibody Screen NEG     Sample Expiration 09/16/2014     No results found.  Review of Systems  Constitutional: Negative.   HENT: Negative.   Eyes: Negative.   Respiratory: Negative.   Cardiovascular: Negative.   Gastrointestinal: Negative.   Genitourinary: Negative.   Musculoskeletal: Negative.   Skin: Negative.   Neurological: Negative.   Endo/Heme/Allergies: Negative.   Psychiatric/Behavioral: Negative.     Blood pressure 117/43, pulse 107, temperature 97.9 F (36.6 C), temperature source Oral, resp. rate 20, height 5\' 8"  (1.727 m), weight 105.461 kg (232 lb 8 oz), SpO2 98.00%. Physical Exam  Constitutional: He is oriented to person, place, and time. He appears well-developed and well-nourished.  Eyes: Pupils are equal, round, and reactive to light.  Neck: Normal range of motion.  Respiratory: Effort normal.  GI: Soft.  Neurological: He is alert and oriented to person, place, and time. He has normal strength. GCS eye subscore is 4. GCS verbal subscore is 5. GCS motor subscore is 6.  Right-sided cranial defect otherwise he is awake alert neurologically intact radial nerves are intact strength is 5 out of 5     Assessment/Plan 68 year old presents for reimplantation of his cranial flap  Shuan Statzer P 09/13/2014, 10:40 AM

## 2014-09-13 NOTE — Anesthesia Preprocedure Evaluation (Addendum)
Anesthesia Evaluation  Patient identified by MRN, date of birth, ID band Patient awake    Reviewed: Allergy & Precautions, H&P , NPO status , Patient's Chart, lab work & pertinent test results, reviewed documented beta blocker date and time   Airway Mallampati: II TM Distance: >3 FB Neck ROM: Full    Dental  (+) Dental Advisory Given   Pulmonary shortness of breath, former smoker,          Cardiovascular hypertension, Pt. on medications and Pt. on home beta blockers + CAD, + Past MI, + CABG and +CHF + dysrhythmias Atrial Fibrillation     Neuro/Psych Anxiety CVA    GI/Hepatic GERD-  Medicated,(+)     substance abuse  alcohol use,   Endo/Other    Renal/GU      Musculoskeletal   Abdominal   Peds  Hematology  (+) anemia ,   Anesthesia Other Findings   Reproductive/Obstetrics                          Anesthesia Physical Anesthesia Plan  ASA: III  Anesthesia Plan: General   Post-op Pain Management:    Induction: Intravenous  Airway Management Planned: Oral ETT  Additional Equipment: Arterial line  Intra-op Plan:   Post-operative Plan: Extubation in OR  Informed Consent: I have reviewed the patients History and Physical, chart, labs and discussed the procedure including the risks, benefits and alternatives for the proposed anesthesia with the patient or authorized representative who has indicated his/her understanding and acceptance.     Plan Discussed with: CRNA and Surgeon  Anesthesia Plan Comments:         Anesthesia Quick Evaluation

## 2014-09-13 NOTE — Plan of Care (Signed)
Problem: Consults Goal: Diagnosis - Craniotomy Replacement of bone flat d/t prior craniotomy

## 2014-09-14 NOTE — Evaluation (Signed)
Physical Therapy Evaluation Patient Details Name: Alejandro [name] MRN: [id number] DOB: 22-Nov-1946 Today's Date: 09/14/2014   History of Present Illness  68 year old gentleman previous craniotomy with implantation of the right pterional craniotomy flap into the abdominal wall subsequently the patient developed an intracranial infection subdural empyema and intracerebral abscess this was debrided washed out patient is placed on IV antibiotics once the patient was cleared of his infection we recommended reimplantation of his flap approximately 4 months post I&D.  Clinical Impression  Patient demonstrates deficits in functional mobility as indicated below will need continued skilled PT to address deficits and maximize function. Will see as indicated and progress as tolerated.    Follow Up Recommendations Supervision/Assistance - 24 hour    Equipment Recommendations  None recommended by PT    Recommendations for Other Services       Precautions / Restrictions Precautions Precautions: Fall      Mobility  Bed Mobility Overal bed mobility: Modified Independent             General bed mobility comments: increased HOB  Transfers Overall transfer level: Needs assistance Equipment used: 1 person hand held assist Transfers: Sit to/from [employer] Transfers Sit to Stand: Min guard Stand pivot transfers: Min assist          Ambulation/Gait Ambulation/Gait assistance: Min assist Ambulation Distance (Feet): 40 Feet Assistive device: 1 person hand held assist Gait Pattern/deviations: Step-through pattern;Decreased stride length;Drifts right/left;Narrow base of support Gait velocity: decreased      Stairs            Wheelchair Mobility    Modified Rankin (Stroke Patients Only)       Balance Overall balance assessment: Needs assistance Sitting-balance support: Feet supported Sitting balance-Leahy Scale: Fair     Standing balance support: During functional  activity Standing balance-Leahy Scale: Poor Standing balance comment: using B hand held support                             Pertinent Vitals/Pain Pain Assessment: No/denies pain    Home Living Family/patient expects to be discharged to:: Private residence Living Arrangements: Spouse/significant other Available Help at Discharge: Family;Available 24 hours/day Type of Home: House Home Access: Stairs to enter Entrance Stairs-Rails: None Entrance Stairs-Number of Steps: 2 Home Layout: One level Home Equipment: Clinical cytogeneticist - 2 wheels;Cane - quad;Grab bars - tub/shower      Prior Function Level of Independence: Independent with assistive device(s)         Comments: used straight cane occasionally at home     Hand Dominance   Dominant Hand: Right    Extremity/Trunk Assessment   Upper Extremity Assessment: Overall WFL for tasks assessed           Lower Extremity Assessment: Generalized weakness       Cervical / Trunk Assessment: Normal  Communication   Communication: No difficulties  Cognition Arousal/Alertness: Awake/alert Behavior During Therapy: WFL for tasks assessed/performed Overall Cognitive Status: Within Functional Limits for tasks assessed                      General Comments General comments (skin integrity, edema, etc.): head bandages    Exercises        Assessment/Plan    PT Assessment Patient needs continued PT services  PT Diagnosis Difficulty walking;Abnormality of gait;Generalized weakness   PT Problem List Decreased strength;Decreased range of motion;Decreased activity tolerance;Decreased balance;Decreased mobility  PT Treatment  Interventions DME instruction;Gait training;Functional mobility training;Therapeutic activities;Therapeutic exercise;Balance training;Patient/family education   PT Goals (Current goals can be found in the Care Plan section) Acute Rehab PT Goals Patient Stated Goal: none stated PT Goal  Formulation: With patient Time For Goal Achievement: 09/28/14 Potential to Achieve Goals: Good    Frequency Min 3X/week   Barriers to discharge        Co-evaluation PT/OT/SLP Co-Evaluation/Treatment: Yes Reason for Co-Treatment: For patient/therapist safety;Complexity of the patient's impairments (multi-system involvement) PT goals addressed during session: Mobility/safety with mobility OT goals addressed during session: ADL's and self-care       End of Session Equipment Utilized During Treatment: Gait belt Activity Tolerance: Patient tolerated treatment well Patient left: in chair;with call bell/phone within reach Nurse Communication: Mobility status         Time: 0802-2336 PT Time Calculation (min): 23 min   Charges:   PT Evaluation $Initial PT Evaluation Tier I: 1 Procedure PT Treatments $Therapeutic Activity: 8-22 mins   PT G CodesDuncan Dull 09/14/2014, 2:56 PM Alben Deeds, [place] DPT  [phone]

## 2014-09-14 NOTE — Progress Notes (Signed)
Tx to 4N25 at this time.  [place] notified.

## 2014-09-14 NOTE — Progress Notes (Signed)
UR completed.  Jennier Schissler, RN BSN MHA CCM Trauma/Neuro ICU Case Manager [phone]  

## 2014-09-14 NOTE — Progress Notes (Signed)
Patient ID: Alejandro [name], male   DOB: 04/24/1946, 68 y.o.   MRN: [id number] Doing well no significant headache  Awake alert oriented neurologically intact  Transfer to the floor

## 2014-09-14 NOTE — Progress Notes (Signed)
09/14/14 1810  Vitals  Temp 98.1 F (36.7 C)  Temp Source Oral  BP ! 118/57 mmHg  MAP (mmHg) 74  BP Location Right arm  BP Method Automatic  Patient Position (if appropriate) Sitting  Pulse Rate 77  Pulse Rate Source Dinamap  Resp 16  Oxygen Therapy  SpO2 95 %  O2 Device None (Room air)  patient is admitted to room 4 N 25 from Neuro ICU. Admiisions vital are listed above.

## 2014-09-14 NOTE — Progress Notes (Addendum)
Occupational Therapy Evaluation Patient Details Name: Alejandro [name] MRN: [id number] DOB: Nov 28, 1946 Today's Date: 09/14/2014    History of Present Illness 68 year old gentleman previous craniotomy with implantation of the right pterional craniotomy flap into the abdominal wall subsequently the patient developed an intracranial infection subdural empyema and intracerebral abscess. Underwent implantation of his flap approximately 4 months post I&D.   Clinical Impression   PTA, pt mod I with mobility and ADL. Pt making great progress. Ambulating with HHA. Requires Min A with LB ADL, however, wife can assist at this level. Will plan to see for an additional visit to review home safety and fall reduction. Do not anticipate need for HHOT. Will follow acutely to address established goals.     Follow Up Recommendations  Supervision/Assistance - 24 hour;No OT follow up (initial)    Equipment Recommendations  Other (comment) (discussed installing grab bars in the shower)    Recommendations for Other Services       Precautions / Restrictions Precautions Precautions: Fall      Mobility Bed Mobility Overal bed mobility: Modified Independent             General bed mobility comments: increased HOB  Transfers Overall transfer level: Needs assistance   Transfers: Sit to/from Stand;Stand Pivot Transfers Sit to Stand: Min guard Stand pivot transfers: Min assist            Balance Overall balance assessment: Needs assistance Sitting-balance support: Feet supported Sitting balance-Leahy Scale: Fair     Standing balance support: During functional activity Standing balance-Leahy Scale: Poor Standing balance comment: using B hand held support                            ADL Overall ADL's : Needs assistance/impaired     Grooming: Set up   Upper Body Bathing: Set up   Lower Body Bathing: Minimal assistance;Sit to/from stand   Upper Body Dressing : Set up    Lower Body Dressing: Minimal assistance;Sit to/from stand   Toilet Transfer: Minimal assistance   Toileting- Water quality scientist and Hygiene: Min guard       Functional mobility during ADLs: Minimal assistance General ADL Comments: Min A for unsteadiness due to first time up     [place] tested?: Within functional limits    Pertinent Vitals/Pain Pain Assessment: No/denies pain     Hand Dominance Right   Extremity/Trunk Assessment Upper Extremity Assessment Upper Extremity Assessment: Overall WFL for tasks assessed   Lower Extremity Assessment Lower Extremity Assessment: Defer to PT evaluation   Cervical / Trunk Assessment Cervical / Trunk Assessment: Normal   Communication Communication Communication: No difficulties   Cognition Arousal/Alertness: Awake/alert Behavior During Therapy: WFL for tasks assessed/performed Overall Cognitive Status: Within Functional Limits for tasks assessed                     General Comments   Very cooperative and pleasant    Exercises       Shoulder Instructions      Home Living Family/patient expects to be discharged to:: Private residence Living Arrangements: Spouse/significant other Available Help at Discharge: Family;Available 24 hours/day Type of Home: House Home Access: Stairs to enter [employer] of Steps: 2 Entrance Stairs-Rails: None Home Layout: One level     Bathroom  Shower/Tub: Occupational psychologist: Standard Bathroom Accessibility: Yes How Accessible: Accessible via wheelchair Home Equipment: Clinical cytogeneticist - 2 wheels;Cane - quad;Grab bars - tub/shower          Prior Functioning/Environment Level of Independence: Independent with assistive device(s)        Comments: used straight cane occasionally at home    OT Diagnosis: Generalized weakness   OT Problem List: Decreased strength;Decreased  activity tolerance;Impaired balance (sitting and/or standing);Obesity   OT Treatment/Interventions: Self-care/ADL training;Energy conservation;DME and/or AE instruction;Patient/family education;Therapeutic activities;Balance training    OT Goals(Current goals can be found in the care plan section) Acute Rehab OT Goals Patient Stated Goal: none stated OT Goal Formulation: With patient Time For Goal Achievement: 09/28/14 Potential to Achieve Goals: Good  OT Frequency: Min 2X/week   Barriers to D/C:            Co-evaluation PT/OT/SLP Co-Evaluation/Treatment: Yes Reason for Co-Treatment: For patient/therapist safety;Complexity of the patient's impairments (multi-system involvement)   OT goals addressed during session: ADL's and self-care      End of Session Equipment Utilized During Treatment: Gait belt Nurse Communication: Mobility status  Activity Tolerance: Patient tolerated treatment well Patient left: in chair;with call bell/phone within reach   Time: 1429-1450 OT Time Calculation (min): 21 min Charges:  OT General Charges $OT Visit: 1 Procedure OT Evaluation $Initial OT Evaluation Tier I: 1 Procedure OT Treatments $Self Care/Home Management : 8-22 mins G-Codes:    Jaylenn Altier,HILLARY 10/14/2014, 2:51 PM   [facility], OTR/L  [phone] 10/14/14

## 2014-09-15 ENCOUNTER — Encounter: Payer: Self-pay | Admitting: Internal Medicine

## 2014-09-15 ENCOUNTER — Encounter (HOSPITAL_COMMUNITY): Payer: Self-pay | Admitting: Neurosurgery

## 2014-09-15 NOTE — Progress Notes (Signed)
Physical Therapy Treatment Patient Details Name: Alejandro [name] MRN: [id number] DOB: January 06, 1946 Today's Date: 09/25/14    History of Present Illness 68 year old gentleman previous Rt craniotomy 04/2014 with implantation of the craniotomy flap into the abdominal wall. The patient developed an intracranial infection and abscess with I&D 06/2014. Adm 09/13/14 for cranioplasty with flap replacement PMHx- CAD, HTN, afib, CVA, CHF     PT Comments    Pt progressing very well. Very safety conscious. Denies any falls since the fall in 04/2014 with SDH.   Follow Up Recommendations  Supervision/Assistance - 24 hour     Equipment Recommendations  None recommended by PT    Recommendations for Other Services       Precautions / Restrictions Precautions Precautions: Fall    Mobility  Bed Mobility                  Transfers Overall transfer level: Needs assistance Equipment used: None;Rolling walker (2 wheeled) Transfers: Sit to/from Stand Sit to Stand: Min guard         General transfer comment: x 3 with pt demonstrating good balance; when using RW required cues for safe use  Ambulation/Gait Ambulation/Gait assistance: Min guard Ambulation Distance (Feet): 220 Feet Assistive device: Rolling walker (2 wheeled) (IV pole in Lt hand) Gait Pattern/deviations: WFL(Within Functional Limits) Gait velocity: decreased   General Gait Details: pt without drift Lt or Rt (even with head turns), able to vary speed up and down, quick stops and turns without LOB. Pt did not feel comfortable progressing to no device yet   Stairs            Wheelchair Mobility    Modified Rankin (Stroke Patients Only)       Balance             Standing balance-Leahy Scale: Good                      Cognition Arousal/Alertness: Awake/alert Behavior During Therapy: WFL for tasks assessed/performed Overall Cognitive Status: Within Functional Limits for tasks assessed                       Exercises      General Comments        Pertinent Vitals/Pain Pain Assessment: No/denies pain    Home Living                      Prior Function            PT Goals (current goals can now be found in the care plan section) Acute Rehab PT Goals Patient Stated Goal: walk with cane (if needed) Progress towards PT goals: Progressing toward goals    Frequency  Min 3X/week    PT Plan Current plan remains appropriate    Co-evaluation             End of Session Equipment Utilized During Treatment: Gait belt Activity Tolerance: Patient tolerated treatment well Patient left: in chair;with call bell/phone within reach     Time: 2637-8588 PT Time Calculation (min): 20 min  Charges:  $Gait Training: 8-22 mins                    G Codes:      Lovelee Forner 2014/09/25, 11:47 AM Pager [phone]

## 2014-09-16 ENCOUNTER — Inpatient Hospital Stay (HOSPITAL_COMMUNITY): Payer: BC Managed Care – PPO

## 2014-09-16 MED ORDER — HYDROCODONE-ACETAMINOPHEN 5-325 MG PO TABS: 1.0000 | ORAL_TABLET | ORAL | Status: DC | PRN

## 2014-09-16 NOTE — Progress Notes (Signed)
Patient returned from [place]. He is ready for discharge. Awaiting for transportation from wife.  Ave Filter, RN

## 2014-09-16 NOTE — Progress Notes (Signed)
Patient ID: Alejandro [name], male   DOB: Jun 02, 1946, 68 y.o.   MRN: [id number] Doing well no complaints  Awake alert oriented incision clean dry and intact  Discharge home

## 2014-09-16 NOTE — Progress Notes (Signed)
Occupational Therapy Treatment Patient Details Name: Alejandro [name] MRN: [id number] DOB: 11/09/1946 Today's Date: 09/16/2014    History of present illness 68 y.o. male previous Rt craniotomy 04/2014 with implantation of the craniotomy flap into the abdominal wall. The patient developed an intracranial infection and abscess with I&D 06/2014. Adm 09/13/14 for cranioplasty with flap replacement PMHx- CAD, HTN, afib, CVA, CHF    OT comments  Patient evaluated by Occupational Therapy with no further acute OT needs identified. Pt provided with home safety and fall prevention education and the patient has no further questions. See below for any follow-up Occupational Therapy or equipment needs. OT to sign off. Thank you for referral.    Follow Up Recommendations  Supervision/Assistance - 24 hour;No OT follow up    Equipment Recommendations  None recommended by OT    Recommendations for Other Services      Precautions / Restrictions Precautions Precautions: Fall Restrictions Weight Bearing Restrictions: No       Mobility Bed Mobility               General bed mobility comments: Pt in chair on arrival  Transfers Overall transfer level: Modified independent Equipment used: None Transfers: Sit to/from Stand Sit to Stand: Min guard         General transfer comment: Pt demonstrates good hand placement (grab bars and chair) and balance. Pt used IV pole as substitute for his cane (unable to find one on floor).    Balance Overall balance assessment: Needs assistance Sitting-balance support: Feet supported Sitting balance-Leahy Scale: Fair     Standing balance support: Single extremity supported;During functional activity Standing balance-Leahy Scale: Fair Standing balance comment: using one hand held support on IV pole (cane substitute) and sink                   ADL       Grooming: Wash/dry hands;Wash/dry face;Oral care;Supervision/safety;Standing                            Tub/ Shower Transfer: Walk-in shower;Min guard;Cueing for safety;3 in 1;Grab bars (used IV pole as substitute for cane) Clinical cytogeneticist Details (indicate cue type and reason): Pt required verbal cues for hand placement most likely due to unfamiliar environment and different bathroom setup Functional mobility during ADLs: Min guard (IV pole substitute for cane) General ADL Comments: Pt provided with home safety and fall prevention education. Pt stated he already uses several strategies mentioned.       Vision                     Perception     Praxis      Cognition   Behavior During Therapy: WFL for tasks assessed/performed Overall Cognitive Status: Within Functional Limits for tasks assessed                       Extremity/Trunk Assessment  Upper Extremity Assessment Upper Extremity Assessment: Overall WFL for tasks assessed   Lower Extremity Assessment Lower Extremity Assessment: Defer to PT evaluation   Cervical / Trunk Assessment Cervical / Trunk Assessment: Normal    Exercises     Shoulder Instructions       General Comments      Pertinent Vitals/ Pain       Pain Assessment: No/denies pain  Home Living  Prior Functioning/Environment Level of Independence: Independent        Comments: used straight cane occasionally at home   Frequency Min 2X/week     Progress Toward Goals  OT Goals(current goals can now be found in the care plan section)        Plan      Co-evaluation                 End of Session Equipment Utilized During Treatment: Gait belt (IV pole as cane substitute)   Activity Tolerance Patient tolerated treatment well   Patient Left in chair;with call bell/phone within reach   Nurse Communication Mobility status        Time: 1740-8144 OT Time Calculation (min): 20 min  Charges:    Redmond Baseman 09/16/2014, 11:06  AM

## 2014-09-16 NOTE — Progress Notes (Signed)
Discharge instructions giving to patient and family. RX given to pt/family. No other concerns voice at this time. No other distress noted. Awaiting on CT scan per MD prior to to discharge.  Ave Filter, RN

## 2014-09-16 NOTE — Discharge Summary (Signed)
  Physician Discharge Summary  Patient ID: Alejandro [name] MRN: [id number] DOB/AGE: July 28, 1946 68 y.o.  Admit date: 09/13/2014 Discharge date: 09/16/2014  Admission Diagnoses:cranial defect  Discharge Diagnoses: same Active Problems:   Status post craniotomy   History of cranioplasty   Discharged Condition: good  Hospital Course: patient was admitted hospital underwent a replacement of his right frontoparietal craniotomy flap that had been implanted in the abdominal wall.postoperatively to the ICU did very well was transferred to floor was convalescing well the floor was stable for discharge posterior day 3.  Consults: Significant Diagnostic Studies: Treatments:replacement right pterional craniotomy flap..Blood pressure 132/70, pulse 84, temperature 97.8 F (36.6 C), temperature source Oral, resp. rate 18, height 5\' 8"  (1.727 m), weight 84.2 kg (185 lb 10 oz), SpO2 96.00%. awake alert oriented neurologically nonfocal incision clean dry and intact Discharge Exam:   Disposition: home     Medication List         carvedilol 12.5 MG tablet  Commonly known as:  COREG  Take 25 mg by mouth 2 (two) times daily with a meal.     FLUoxetine 20 MG tablet  Commonly known as:  PROZAC  Take 1 tablet (20 mg total) by mouth daily.     folic acid 1 MG tablet  Commonly known as:  FOLVITE  Take 1 tablet (1 mg total) by mouth daily.     furosemide 40 MG tablet  Commonly known as:  LASIX  Take 1 tablet (40 mg total) by mouth daily.     HYDROcodone-acetaminophen 5-325 MG per tablet  Commonly known as:  NORCO/VICODIN  Take 1 tablet by mouth every 4 (four) hours as needed for moderate pain.     levETIRAcetam 500 MG tablet  Commonly known as:  KEPPRA  Take 1 tablet (500 mg total) by mouth 2 (two) times daily.     lisinopril 2.5 MG tablet  Commonly known as:  ZESTRIL  Take 1 tablet (2.5 mg total) by mouth daily.     multivitamin with minerals Tabs tablet  Take 1 tablet by mouth daily.      pantoprazole 40 MG tablet  Commonly known as:  PROTONIX  Take 1 tablet (40 mg total) by mouth at bedtime.     potassium chloride 10 MEQ tablet  Commonly known as:  K-DUR  Take 1 tablet daily     simvastatin 40 MG tablet  Commonly known as:  ZOCOR  Take 1 tablet (40 mg total) by mouth every evening.           Follow-up Information   Follow up with [facility], MD.   Specialty:  Neurosurgery   Contact information:   [address] [place] [address] [phone]       Signed: Markiesha Delia P 09/16/2014, 7:32 AM

## 2014-09-16 NOTE — Discharge Instructions (Signed)
No lifting no bending no twisting no driving a riding a car unless he is come back and forth to see me.

## 2014-09-16 NOTE — Progress Notes (Signed)
I agree with the following treatment note after reviewing documentation.   Jeri Modena OTR/L Pager: [phone] Office: [phone] .

## 2014-09-23 ENCOUNTER — Encounter: Payer: Self-pay | Admitting: Cardiovascular Disease

## 2014-09-23 ENCOUNTER — Other Ambulatory Visit (INDEPENDENT_AMBULATORY_CARE_PROVIDER_SITE_OTHER): Payer: BC Managed Care – PPO | Admitting: *Deleted

## 2014-09-23 ENCOUNTER — Ambulatory Visit (INDEPENDENT_AMBULATORY_CARE_PROVIDER_SITE_OTHER): Payer: BC Managed Care – PPO | Admitting: Cardiovascular Disease

## 2014-09-23 VITALS — BP 134/68 | HR 82 | Ht 68.0 in | Wt 233.4 lb

## 2014-09-23 DIAGNOSIS — I482 Chronic atrial fibrillation, unspecified: Secondary | ICD-10-CM

## 2014-09-23 DIAGNOSIS — E785 Hyperlipidemia, unspecified: Secondary | ICD-10-CM

## 2014-09-23 DIAGNOSIS — I5022 Chronic systolic (congestive) heart failure: Secondary | ICD-10-CM

## 2014-09-23 DIAGNOSIS — I25810 Atherosclerosis of coronary artery bypass graft(s) without angina pectoris: Secondary | ICD-10-CM

## 2014-09-23 DIAGNOSIS — I4891 Unspecified atrial fibrillation: Secondary | ICD-10-CM

## 2014-09-23 LAB — LIPID PANEL
Cholesterol: 120 mg/dL (ref 0–200)
HDL: 29.6 mg/dL — ABNORMAL LOW (ref >39.00)
LDL Cholesterol: 56 mg/dL (ref 0–99)
NonHDL: 90.4
TRIGLYCERIDES: 174 mg/dL — AB (ref 0.0–149.0)
Total CHOL/HDL Ratio: 4
VLDL: 34.8 mg/dL (ref 0.0–40.0)

## 2014-09-23 LAB — BASIC METABOLIC PANEL
BUN: 19 mg/dL (ref 6–23)
CALCIUM: 9.6 mg/dL (ref 8.4–10.5)
CHLORIDE: 100 meq/L (ref 96–112)
CO2: 30 mEq/L (ref 19–32)
CREATININE: 1.2 mg/dL (ref 0.4–1.5)
GFR: 61.39 mL/min (ref >60.00)
Glucose, Bld: 90 mg/dL (ref 70–99)
Potassium: 4.3 mEq/L (ref 3.5–5.1)
Sodium: 136 mEq/L (ref 135–145)

## 2014-09-23 LAB — HEPATIC FUNCTION PANEL
ALK PHOS: 68 U/L (ref 39–117)
ALT: 18 U/L (ref 0–53)
AST: 24 U/L (ref 0–37)
Albumin: 3.5 g/dL (ref 3.5–5.2)
BILIRUBIN DIRECT: 0 mg/dL (ref 0.0–0.3)
Total Bilirubin: 0.4 mg/dL (ref 0.2–1.2)
Total Protein: 7.6 g/dL (ref 6.0–8.3)

## 2014-09-23 NOTE — Progress Notes (Signed)
HPI:   68 year old gentleman presenting for followup evaluation. 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 Most recent assessment of LVEF in 2014 by cardiac MRI was 52%. The patient had a mechanical fall in May and developed a subdural hematoma requiring surgical evacuation. He subsequently had 4 surgeries in total. His subdural hematoma occur on a background of chronic warfarin therapy. He was deemed to no longer be a candidate for anticoagulation in the future.  The patient has no chest pain. He was last seen by Richardson Dopp in July of this year at which time he was noted to have signs of volume overload. He has been treated with Lasix. Leg swelling has resolved. He's had no shortness of breath at rest. He denies orthopnea or PND. No palpitations or other complaints at this time. He has neurosurgical followup next week.  Studies:  - LHC (12/2008): Left main 95%, LAD 90%, circumflex marginal 70%, EF 25-30%. => CABG  - Echo (02/24/13): Mild LVH, EF 30-35%, mild LAE, mild RVE, mildly reduced RVSF  - Cardiac MRI (09/08/13): EF 52%, no hyper-enhancement or infarct in LV myocardium, mild to moderate LAE, mild MR   Outpatient Encounter Prescriptions as of 09/23/2014  Medication Sig  . carvedilol (COREG) 12.5 MG tablet Take 25 mg by mouth 2 (two) times daily with a meal.  . FLUoxetine (PROZAC) 20 MG tablet Take 1 tablet (20 mg total) by mouth daily.  . folic acid (FOLVITE) 1 MG tablet Take 1 tablet (1 mg total) by mouth daily.  . furosemide (LASIX) 40 MG tablet Take 1 tablet (40 mg total) by mouth daily.  Marland Kitchen HYDROcodone-acetaminophen (NORCO/VICODIN) 5-325 MG per tablet Take 1 tablet by mouth every 4 (four) hours as needed for moderate pain.  Marland Kitchen levETIRAcetam (KEPPRA) 500 MG tablet Take 1 tablet (500 mg total) by mouth 2 (two) times daily.  Marland Kitchen lisinopril (ZESTRIL) 2.5 MG tablet Take  1 tablet (2.5 mg total) by mouth daily.  . Multiple Vitamin (MULTIVITAMIN WITH MINERALS) TABS tablet Take 1 tablet by mouth daily.  . pantoprazole (PROTONIX) 40 MG tablet Take 1 tablet (40 mg total) by mouth at bedtime.  . potassium chloride (K-DUR) 10 MEQ tablet Take 1 tablet daily  . potassium chloride (K-DUR,KLOR-CON) 10 MEQ tablet   . simvastatin (ZOCOR) 40 MG tablet Take 1 tablet (40 mg total) by mouth every evening.    No Known Allergies  Past Medical History  Diagnosis Date  . Systolic heart failure   . 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 Dyslipidemia   . HTN (hypertension)   . Atrial fibrillation   . Cancer     around nose  . Tobacco abuse   . Alcohol abuse   . H/O hiatal hernia   . Myocardial infarction   . Dysrhythmia     atrial fib  . Stroke 5/15  . CHF (congestive heart failure)   . Shortness of breath     occ  . GERD (gastroesophageal reflux disease)    ROS: Negative except as per HPI  BP 134/68  Pulse 82  Ht 5\' 8"  (1.727 m)  Wt 233 lb 6.4 oz (105.87 kg)  BMI 35.50 kg/m2  PHYSICAL EXAM: Pt is alert and oriented, pleasant obese male in NAD HEENT: There is a well-healing craniotomy scar with staples Neck: JVP - normal, carotids 2+= without bruits Lungs:  CTA bilaterally CV: RRR without murmur or gallop Abd: soft, NT, Positive BS, obese Ext: no C/C/E, distal pulses intact and equal Skin: warm/dry no rash  ASSESSMENT AND PLAN: 1. CAD status post CABG. The patient is stable without symptoms of angina. Recent events noted with respect to his subdural hematoma and multiple neurosurgical operations. He will continue on carvedilol, lisinopril, and simvastatin.  2. Permanent atrial fibrillation. No longer candidate for anticoagulation. If he is cleared by Dr. Saintclair Halsted, would favor aspirin 81 mg daily.  3. Cardiomyopathy, mixed, with new chronic systolic heart failure New York Heart Association class II. Will continue furosemide,  lisinopril, and carvedilol. No physical exam evidence of volume excess. Lab work was drawn today and will followup with recommendations on his potassium supplementation if any changes need to be made based on his metabolic panel.  For followup I will see him back in 6 months. Extensive records were reviewed today from his recent hospitalizations as part of this office encounter.  [name] 09/23/2014 10:03 AM

## 2014-09-23 NOTE — Patient Instructions (Signed)
Your physician wants you to follow-up in: 6 MONTHS with Dr [name].  You will receive a reminder letter in the mail two months in advance. If you don't receive a letter, please call our office to schedule the follow-up appointment.  Your physician recommends that you continue on your current medications as directed. Please refer to the Current Medication list given to you today.  When you see Dr Saintclair Halsted next week please ask if you are okay to resume Aspirin 81mg  once a day.

## 2014-10-20 ENCOUNTER — Other Ambulatory Visit (HOSPITAL_COMMUNITY): Payer: Self-pay | Admitting: Neurosurgery

## 2014-10-20 DIAGNOSIS — R911 Solitary pulmonary nodule: Secondary | ICD-10-CM

## 2014-10-27 ENCOUNTER — Encounter (HOSPITAL_COMMUNITY): Payer: Self-pay

## 2014-10-27 ENCOUNTER — Ambulatory Visit (HOSPITAL_COMMUNITY)
Admission: RE | Admit: 2014-10-27 | Discharge: 2014-10-27 | Disposition: A | Discharge status: Home / Self Care | Payer: BC Managed Care – PPO | Source: Ambulatory Visit | Attending: Neurosurgery | Admitting: Neurosurgery

## 2014-10-27 DIAGNOSIS — Z951 Presence of aortocoronary bypass graft: Secondary | ICD-10-CM | POA: Diagnosis not present

## 2014-10-27 DIAGNOSIS — I2510 Atherosclerotic heart disease of native coronary artery without angina pectoris: Secondary | ICD-10-CM | POA: Diagnosis not present

## 2014-10-27 DIAGNOSIS — K5790 Diverticulosis of intestine, part unspecified, without perforation or abscess without bleeding: Secondary | ICD-10-CM | POA: Insufficient documentation

## 2014-10-27 DIAGNOSIS — R911 Solitary pulmonary nodule: Secondary | ICD-10-CM | POA: Diagnosis not present

## 2014-10-27 LAB — GLUCOSE, CAPILLARY: Glucose-Capillary: 85 mg/dL (ref 70–99)

## 2014-10-27 MED ORDER — FLUDEOXYGLUCOSE F - 18 (FDG) INJECTION
12.0000 | Freq: Once | INTRAVENOUS | Status: AC | PRN
  Administered 2014-10-27: 12 via INTRAVENOUS

## 2014-10-28 ENCOUNTER — Other Ambulatory Visit: Payer: Self-pay | Admitting: Cardiovascular Disease

## 2014-11-01 ENCOUNTER — Other Ambulatory Visit: Payer: Self-pay | Admitting: Family Medicine

## 2014-11-01 ENCOUNTER — Other Ambulatory Visit: Payer: Self-pay

## 2014-11-01 ENCOUNTER — Telehealth: Payer: Self-pay | Admitting: Internal Medicine

## 2014-11-01 DIAGNOSIS — R918 Other nonspecific abnormal finding of lung field: Secondary | ICD-10-CM

## 2014-11-01 NOTE — Telephone Encounter (Addendum)
Alejandro [name] called from Dr. Windy Carina office in regards to nodules found on CT scan, Dr. Saintclair Halsted ordered PET scan and would like to have the results reviewed to see how Dr. Larose Kells would like to proceed

## 2014-11-01 NOTE — Telephone Encounter (Signed)
Tried calling Pt again, no answer, no voicemail set up.

## 2014-11-01 NOTE — Telephone Encounter (Signed)
Pt has appt scheduled with Dr. Annamaria Boots regarding abnormal CT on 11/02/2014 at 2:00.

## 2014-11-01 NOTE — Telephone Encounter (Signed)
FYI

## 2014-11-01 NOTE — Telephone Encounter (Signed)
Please advise 

## 2014-11-01 NOTE — Telephone Encounter (Signed)
Will try contacting Pt again to inform him of abnormal CT and about referral to Pulmonology.

## 2014-11-01 NOTE — Telephone Encounter (Signed)
Attempted to call pt ---- no answer and no voicemail set up Pt had abnormality on scan that Dr Saintclair Halsted did We need to refer to pulmonary for further evaluation--- ashlee , it looks like cancer but won't know for sure until biopsy

## 2014-11-02 ENCOUNTER — Encounter: Payer: Self-pay | Admitting: Internal Medicine

## 2014-11-02 ENCOUNTER — Ambulatory Visit (INDEPENDENT_AMBULATORY_CARE_PROVIDER_SITE_OTHER): Payer: BC Managed Care – PPO | Admitting: Internal Medicine

## 2014-11-02 ENCOUNTER — Encounter (INDEPENDENT_AMBULATORY_CARE_PROVIDER_SITE_OTHER): Payer: Self-pay

## 2014-11-02 VITALS — BP 120/78 | HR 74 | Ht 68.0 in | Wt 242.4 lb

## 2014-11-02 DIAGNOSIS — R911 Solitary pulmonary nodule: Secondary | ICD-10-CM

## 2014-11-02 DIAGNOSIS — I482 Chronic atrial fibrillation, unspecified: Secondary | ICD-10-CM

## 2014-11-02 DIAGNOSIS — K7030 Alcoholic cirrhosis of liver without ascites: Secondary | ICD-10-CM

## 2014-11-02 DIAGNOSIS — K7460 Unspecified cirrhosis of liver: Secondary | ICD-10-CM | POA: Insufficient documentation

## 2014-11-02 DIAGNOSIS — R918 Other nonspecific abnormal finding of lung field: Secondary | ICD-10-CM

## 2014-11-02 DIAGNOSIS — J449 Chronic obstructive pulmonary disease, unspecified: Secondary | ICD-10-CM | POA: Insufficient documentation

## 2014-11-02 NOTE — Telephone Encounter (Signed)
Check and make sure he did make the appt

## 2014-11-02 NOTE — Assessment & Plan Note (Signed)
Moderate obstructive airways disease. Response to bronchodilator not assessed but he does not describe significant wheeze or phlegm. CT had shown some emphysema. PFTs show restriction of exhaled volume but this is consistent with his substantial abdominal obesity as well as probable air trapping.

## 2014-11-02 NOTE — Assessment & Plan Note (Signed)
VR controlled chronc AFib. Off anticoagulant after fall, SDH, craniotomy

## 2014-11-02 NOTE — Assessment & Plan Note (Signed)
This is likely due to his history of ethanol use

## 2014-11-02 NOTE — Patient Instructions (Addendum)
Order- Office spirometry  Dx   Lung mass, right- done  I will review your studies with one of my associates for help with a decision as to whether a bronchoscopy from the inside, or a needle biopsy from the outside would be the better way to get a biopsy sample of the right lung nodule.

## 2014-11-02 NOTE — Assessment & Plan Note (Signed)
This is a bronchogenic carcinoma until proven otherwise. I have discussed workup. I have asked Dr. [name] to help evaluate best biopsy approach, thinking guided bronchoscopy versus needle biopsy.

## 2014-11-02 NOTE — Progress Notes (Signed)
11/02/14- 67 yoM former smoker referred courtesy of Dr Lowne/Dr Larose Kells; abnormal PET scan, RUL lung nodule. Medical hx ETOH, SDH, intracerebral mass/ abscess/ craniotomy, sCHF, AFib, CAD/ CABG, HBP   Wife here  Difficult summer- Fell on coumadin, SDH w craniotomy, then second craniotomy for abscess. Now only on BASA for chronic AFib. Denies hx dx'd lung disease. Quit cigs 2014. Retired Biomedical engineer. Now persistent dry cough. Some DOE brisk walk/ stairs, not very active. No nodes, blood, fever. CXR then CT chest- RUL 3 cm nodule. PET confirms.   CT chest 09/16/14 IMPRESSION: Irregular nodular appearing lesion in the lateral segment right middle lobe measuring 3.0 x 2.6 cm. Neoplasm is suspected. There is an adjacent 5 mm nodular. Enlarged lymph node just anterior to the distal most aspect of the trachea toward the right. Several smaller mediastinal lymph nodes are present as well. Small right pleural effusion with right base atelectasis. There is underlying centrilobular emphysema. There are multiple foci of atherosclerotic change and coronary artery calcification. Given this combination of findings, correlation with nuclear medicine PET study may be advised to further evaluate. These results will be called to the ordering clinician or representative by the Radiologist Assistant, and communication documented in the PACS or zVision Dashboard. Electronically Signed  By: Lowella Grip M.D.  On: 09/16/2014 15:54  PET 10/27/14 IMPRESSION: 1. Hypermetabolic 3.2 x 2.1 cm macrolobulated and spiculated right upper lobe mass with subcarinal and right paratracheal lymphadenopathy. 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 2. The liver has a slightly shrunken appearance and nodular contour, suggestive of underlying cirrhosis. 3. Mild colonic diverticulosis without findings to suggest  acute diverticulitis at this time. 4. Atherosclerosis, including left main and 3 vessel coronary artery disease. Status post median sternotomy for CABG, including LIMA to the LAD. 5. Additional incidental findings, as above. Electronically Signed  By: Vinnie Langton M.D.  On: 10/27/2014 15:18 Office spirometry 11/02/14- Moderate obstruction. Also moderate restriction of exhaled volume (obessity and airtrapping ?) FEV1 1.98/ 60%, FEF25-75 48%  Prior to Admission medications   Medication Sig Start Date End Date Taking? Authorizing Provider  aspirin 81 MG tablet Take 81 mg by mouth daily.   Yes Historical Provider, MD  carvedilol (COREG) 12.5 MG tablet Take 12.5 mg by mouth 2 (two) times daily with a meal.    Yes Historical Provider, MD  FLUoxetine (PROZAC) 20 MG tablet Take 1 tablet (20 mg total) by mouth daily. 07/21/14  Yes Colon Branch, MD  folic acid (FOLVITE) 1 MG tablet Take 1 tablet (1 mg total) by mouth daily. 07/26/14  Yes Colon Branch, MD  furosemide (LASIX) 40 MG tablet Take one tablet by mouth one time daily 11/01/14  Yes [name], MD  levETIRAcetam (KEPPRA) 500 MG tablet Take 1 tablet (500 mg total) by mouth 2 (two) times daily. 07/21/14  Yes Colon Branch, MD  lisinopril (ZESTRIL) 2.5 MG tablet Take 1 tablet (2.5 mg total) by mouth daily. 06/08/14  Yes Liliane Shi, PA-C  Multiple Vitamin (MULTIVITAMIN WITH MINERALS) TABS tablet Take 1 tablet by mouth daily. 05/14/14  Yes Daniel J Angiulli, PA-C  pantoprazole (PROTONIX) 40 MG tablet Take 1 tablet (40 mg total) by mouth at bedtime. 07/02/14  Yes [name], MD  potassium chloride (K-DUR) 10 MEQ tablet Take 1 tablet daily 07/02/14  Yes [name], MD  simvastatin (ZOCOR) 40 MG tablet Take 1 tablet (40 mg total) by mouth  every evening. 09/01/14  Yes Colon Branch, MD   Past Medical History  Diagnosis Date  . Systolic heart failure   . 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 Dyslipidemia   . HTN  (hypertension)   . Atrial fibrillation   . Cancer     around nose  . Tobacco abuse   . Alcohol abuse   . H/O hiatal hernia   . Myocardial infarction   . Dysrhythmia     atrial fib  . Stroke 5/15  . CHF (congestive heart failure)   . Shortness of breath     occ  . GERD (gastroesophageal reflux disease)    Past Surgical History  Procedure Laterality Date  . Umbilical hernia repair  2010  . Hernia repair  2012  . Ventral hernia repair N/A 01/22/2013    Procedure: HERNIA REPAIR VENTRAL ADULT;  Surgeon: [name], MD;  Location: [place];  Service: General;  Laterality: N/A;  . Application of a-cell of chest/abdomen N/A 01/22/2013    Procedure: APPLICATION OF A-CELL OF CHEST/ABDOMEN;  Surgeon: [name], MD;  Location: [place];  Service: General;  Laterality: N/A;  . Cystoscopy N/A 01/22/2013    Procedure: Consuela Mimes;  Surgeon: [name], MD;  Location: [place];  Service: Urology;  Laterality: N/A;  Cystoscopy with balloon dilation. Insertion of coude catheter.  . Craniotomy Right 04/19/2014    Procedure: CRANIOTOMY HEMATOMA EVACUATION SUBDURAL;  Surgeon: [name], MD;  Location: [place] [facility];  Service: Neurosurgery;  Laterality: Right;  right  . Craniotomy Right 04/23/2014    Procedure: Craniotomy for Intracerebral Hemorrhage;  Surgeon: [name], MD;  Location: [place] [facility];  Service: Neurosurgery;  Laterality: Right;  . Craniotomy N/A 06/16/2014    Procedure: Craniotomy for intracerebral abscess and subdural empyema;  Surgeon: [name], MD;  Location: [place] [facility];  Service: Neurosurgery;  Laterality: N/A;  . Coronary artery bypass graft  12/24/2008    4 vessel  . Coronary artery bypass graft  2012?  Marland Kitchen Craniotomy N/A 09/13/2014    Procedure: CRANIOTOMY BONE FLAP/PROSTHETIC PLATE;  Surgeon: [name], MD;  Location: [place] [facility];  Service: Neurosurgery;  Laterality: N/A;   Family History  Problem Relation Age of Onset  . Diabetes Mother   . Heart disease Mother   .  Diabetes Father   . Heart disease Father   . Heart disease Brother     s/p CABG at 45  . Colon cancer Neg Hx   . Prostate cancer Neg Hx   . Rheum arthritis Daughter    History   Social History  . Marital Status: Married    Spouse Name: N/A    Number of Children: 2  . Years of Education: N/A   Occupational History  . [employer]   .     Social History Main Topics  . Smoking status: Former Smoker -- 1.00 packs/day for 50 years    Types: Cigarettes    Quit date: 04/19/2013  . Smokeless tobacco: Never Used  . Alcohol Use: No     Comment: quit  . Drug Use: No  . Sexual Activity: Not on file   Other Topics Concern  . Not on file   Social History Narrative   ROS-see HPI Constitutional:   No-   weight loss, night sweats, fevers, chills, fatigue, lassitude. HEENT:   No-  headaches, difficulty swallowing, tooth/dental  problems, sore throat,       No-  sneezing, itching, ear ache, nasal congestion, post nasal drip,  CV:  No-   chest pain, orthopnea, PND, swelling in lower extremities, anasarca,                                  dizziness, +palpitations Resp: + shortness of breath with exertion or at rest.              No-   productive cough,  + non-productive cough,  No- coughing up of blood.              No-   change in color of mucus.  No- wheezing.  Wife says he snores but doesn't                     stop breathing Skin: No-   rash or lesions. GI:  No-   heartburn, indigestion, abdominal pain, nausea, vomiting, diarrhea,                 change in bowel habits, loss of appetite GU: No-   dysuria, change in color of urine, no urgency or frequency.  No- flank pain. MS:  No-   joint pain or swelling.  No- decreased range of motion.  No- back pain. Neuro-     nothing unusual Psych:  No- change in mood or affect. No depression or anxiety.  No memory loss.  OBJ- Physical Exam General- Alert, Oriented, Affect-appropriate, Distress- none acute, +obese Skin-  rash-none, lesions- none, excoriation- none Lymphadenopathy- none Head- Scar R parietal            Eyes- Gross vision intact, PERRLA, conjunctivae and secretions clear            Ears- Hearing, canals-normal            Nose- Clear, no-Septal dev, mucus, polyps, erosion, perforation             Throat- Mallampati III , mucosa clear , drainage- none, tonsils- atrophic,                          +hoarse Neck- flexible , trachea midline, no stridor , thyroid nl, carotid no bruit Chest - symmetrical excursion , unlabored           Heart/CV- IRR , no murmur , no gallop  , no rub, nl s1 s2                           - JVD- none , edema- none, stasis changes- none, varices- none           Lung- clear to P&A, wheeze- none, cough- none , dullness-none, rub- none           Chest wall- +sternotomy scar Abd- tender-no, distended-no, bowel sounds-present, HSM- no, +obese Br/ Gen/ Rectal- Not done, not indicated Extrem- cyanosis- none, clubbing, none, atrophy- none, strength- nl Neuro- grossly intact to observation

## 2014-11-03 NOTE — Telephone Encounter (Signed)
Pt was seen by pulmonary Dr. Annamaria Boots yesterday.

## 2014-11-04 ENCOUNTER — Telehealth: Payer: Self-pay | Admitting: Internal Medicine

## 2014-11-04 ENCOUNTER — Other Ambulatory Visit: Payer: Self-pay | Admitting: Emergency Medicine

## 2014-11-04 DIAGNOSIS — R911 Solitary pulmonary nodule: Secondary | ICD-10-CM

## 2014-11-04 NOTE — Telephone Encounter (Signed)
Per 11/02/14 OV: I will review your studies with one of my associates for help with a decision as to whether a bronchoscopy from the inside, or a needle biopsy from the outside would be the better way to get a biopsy sample of the right lung nodule.   Please advise Dr. Annamaria Boots thanks

## 2014-11-04 NOTE — Telephone Encounter (Signed)
I spoke to Dr [name] again about this. He will have Mr Griep contacted to arrange bronchoscopy. Joellen Jersey is contacting Mr Deuntae to let him know this is the plan.

## 2014-11-04 NOTE — Telephone Encounter (Signed)
Spoke with patients wife-she is aware that [facility] and [facility] are working on getting this scheduled and they would contact them asap with date, time, and location for procedure. Wife verbalized her understanding and nothing more needed at this time.

## 2014-11-08 ENCOUNTER — Encounter (HOSPITAL_COMMUNITY): Payer: Self-pay | Admitting: *Deleted

## 2014-11-08 NOTE — Progress Notes (Signed)
   11/08/14 1832  OBSTRUCTIVE SLEEP APNEA  Have you ever been diagnosed with sleep apnea through a sleep study? No  Do you snore loudly (loud enough to be heard through closed doors)?  0  Do you often feel tired, fatigued, or sleepy during the daytime? 1  Has anyone observed you stop breathing during your sleep? 0  Do you have, or are you being treated for high blood pressure? 1  BMI more than 35 kg/m2? 1  Age over 69 years old? 1  Neck circumference greater than 40 cm/16 inches? 1 (17.5)  Gender: 1  Obstructive Sleep Apnea Score 6  Score 4 or greater  Results sent to PCP

## 2014-11-09 ENCOUNTER — Encounter (HOSPITAL_COMMUNITY)
Admission: RE | Disposition: A | Discharge status: Home / Self Care | Payer: Self-pay | Source: Ambulatory Visit | Attending: Emergency Medicine

## 2014-11-09 ENCOUNTER — Ambulatory Visit (HOSPITAL_COMMUNITY): Payer: Self-pay | Admitting: Certified Registered Nurse Anesthetist

## 2014-11-09 ENCOUNTER — Ambulatory Visit (HOSPITAL_COMMUNITY): Payer: Self-pay

## 2014-11-09 ENCOUNTER — Ambulatory Visit (HOSPITAL_COMMUNITY): Payer: BC Managed Care – PPO

## 2014-11-09 ENCOUNTER — Encounter (HOSPITAL_COMMUNITY): Payer: Self-pay | Admitting: *Deleted

## 2014-11-09 ENCOUNTER — Ambulatory Visit (HOSPITAL_COMMUNITY)
Admission: RE | Admit: 2014-11-09 | Discharge: 2014-11-09 | Disposition: A | Discharge status: Home / Self Care | Payer: Self-pay | Source: Ambulatory Visit | Attending: Emergency Medicine | Admitting: Emergency Medicine

## 2014-11-09 DIAGNOSIS — Z9889 Other specified postprocedural states: Secondary | ICD-10-CM

## 2014-11-09 DIAGNOSIS — R942 Abnormal results of pulmonary function studies: Secondary | ICD-10-CM | POA: Insufficient documentation

## 2014-11-09 DIAGNOSIS — R911 Solitary pulmonary nodule: Secondary | ICD-10-CM | POA: Insufficient documentation

## 2014-11-09 DIAGNOSIS — R918 Other nonspecific abnormal finding of lung field: Secondary | ICD-10-CM

## 2014-11-09 HISTORY — PX: VIDEO BRONCHOSCOPY WITH ENDOBRONCHIAL ULTRASOUND: SHX6177

## 2014-11-09 LAB — CBC
HCT: 35.8 % — ABNORMAL LOW (ref 39.0–52.0)
HEMOGLOBIN: 12.1 g/dL — AB (ref 13.0–17.0)
MCH: 28.2 pg (ref 26.0–34.0)
MCHC: 33.8 g/dL (ref 30.0–36.0)
MCV: 83.4 fL (ref 78.0–100.0)
Platelets: 208 10*3/uL (ref 150–400)
RBC: 4.29 MIL/uL (ref 4.22–5.81)
RDW: 14.5 % (ref 11.5–15.5)
WBC: 7.4 10*3/uL (ref 4.0–10.5)

## 2014-11-09 LAB — BASIC METABOLIC PANEL
ANION GAP: 14 (ref 5–15)
BUN: 21 mg/dL (ref 6–23)
CALCIUM: 9.6 mg/dL (ref 8.4–10.5)
CHLORIDE: 100 meq/L (ref 96–112)
CO2: 25 mEq/L (ref 19–32)
CREATININE: 1.32 mg/dL (ref 0.50–1.35)
GFR calc non Af Amer: 53 mL/min — ABNORMAL LOW (ref >90)
GFR, EST AFRICAN AMERICAN: 62 mL/min — AB (ref >90)
Glucose, Bld: 99 mg/dL (ref 70–99)
Potassium: 4.5 mEq/L (ref 3.7–5.3)
Sodium: 139 mEq/L (ref 137–147)

## 2014-11-09 LAB — PROTIME-INR
INR: 1.06 (ref 0.00–1.49)
Prothrombin Time: 13.9 seconds (ref 11.6–15.2)

## 2014-11-09 SURGERY — BRONCHOSCOPY, WITH EBUS
Anesthesia: General

## 2014-11-09 MED ORDER — GLYCOPYRROLATE 0.2 MG/ML IJ SOLN
INTRAMUSCULAR | Status: AC
  Filled 2014-11-09: qty 3

## 2014-11-09 MED ORDER — MIDAZOLAM HCL 2 MG/2ML IJ SOLN
INTRAMUSCULAR | Status: AC
  Filled 2014-11-09: qty 2

## 2014-11-09 MED ORDER — SUCCINYLCHOLINE CHLORIDE 20 MG/ML IJ SOLN
INTRAMUSCULAR | Status: AC
  Filled 2014-11-09: qty 1

## 2014-11-09 MED ORDER — MIDAZOLAM HCL 5 MG/5ML IJ SOLN
INTRAMUSCULAR | Status: DC | PRN
  Administered 2014-11-09: 2 mg via INTRAVENOUS

## 2014-11-09 MED ORDER — FENTANYL CITRATE 0.05 MG/ML IJ SOLN
INTRAMUSCULAR | Status: AC
  Filled 2014-11-09: qty 5

## 2014-11-09 MED ORDER — GLYCOPYRROLATE 0.2 MG/ML IJ SOLN
INTRAMUSCULAR | Status: DC | PRN
  Administered 2014-11-09: 0.6 mg via INTRAVENOUS

## 2014-11-09 MED ORDER — ARTIFICIAL TEARS OP OINT
TOPICAL_OINTMENT | OPHTHALMIC | Status: DC | PRN
  Administered 2014-11-09: 1 via OPHTHALMIC

## 2014-11-09 MED ORDER — NEOSTIGMINE METHYLSULFATE 10 MG/10ML IV SOLN
INTRAVENOUS | Status: AC
  Filled 2014-11-09: qty 1

## 2014-11-09 MED ORDER — ARTIFICIAL TEARS OP OINT
TOPICAL_OINTMENT | OPHTHALMIC | Status: AC
  Filled 2014-11-09: qty 3.5

## 2014-11-09 MED ORDER — PHENYLEPHRINE HCL 10 MG/ML IJ SOLN
INTRAMUSCULAR | Status: DC | PRN
  Administered 2014-11-09 (×2): 40 ug via INTRAVENOUS
  Administered 2014-11-09 (×2): 80 ug via INTRAVENOUS

## 2014-11-09 MED ORDER — LIDOCAINE HCL (CARDIAC) 20 MG/ML IV SOLN
INTRAVENOUS | Status: AC
Start: 2014-11-09 — End: 2014-11-09
  Filled 2014-11-09: qty 5

## 2014-11-09 MED ORDER — ONDANSETRON HCL 4 MG/2ML IJ SOLN
INTRAMUSCULAR | Status: AC
  Filled 2014-11-09: qty 2

## 2014-11-09 MED ORDER — FENTANYL CITRATE 0.05 MG/ML IJ SOLN: 25.0000 ug | INTRAMUSCULAR | Status: DC | PRN

## 2014-11-09 MED ORDER — DEXAMETHASONE SODIUM PHOSPHATE 4 MG/ML IJ SOLN
INTRAMUSCULAR | Status: AC
  Filled 2014-11-09: qty 1

## 2014-11-09 MED ORDER — PROPOFOL 10 MG/ML IV BOLUS
INTRAVENOUS | Status: DC | PRN
  Administered 2014-11-09: 110 mg via INTRAVENOUS

## 2014-11-09 MED ORDER — ESMOLOL HCL 10 MG/ML IV SOLN
INTRAVENOUS | Status: AC
  Filled 2014-11-09: qty 10

## 2014-11-09 MED ORDER — ROCURONIUM BROMIDE 100 MG/10ML IV SOLN
INTRAVENOUS | Status: DC | PRN
  Administered 2014-11-09: 40 mg via INTRAVENOUS
  Administered 2014-11-09: 10 mg via INTRAVENOUS

## 2014-11-09 MED ORDER — ESMOLOL HCL 10 MG/ML IV SOLN
INTRAVENOUS | Status: DC | PRN
  Administered 2014-11-09 (×4): 20 mg via INTRAVENOUS
  Administered 2014-11-09: 10 mg via INTRAVENOUS

## 2014-11-09 MED ORDER — ONDANSETRON HCL 4 MG/2ML IJ SOLN
INTRAMUSCULAR | Status: DC | PRN
  Administered 2014-11-09: 4 mg via INTRAVENOUS

## 2014-11-09 MED ORDER — NEOSTIGMINE METHYLSULFATE 10 MG/10ML IV SOLN
INTRAVENOUS | Status: DC | PRN
  Administered 2014-11-09: 4 mg via INTRAVENOUS

## 2014-11-09 MED ORDER — PROPOFOL 10 MG/ML IV BOLUS
INTRAVENOUS | Status: AC
  Filled 2014-11-09: qty 20

## 2014-11-09 MED ORDER — FENTANYL CITRATE 0.05 MG/ML IJ SOLN
INTRAMUSCULAR | Status: DC | PRN
  Administered 2014-11-09: 50 ug via INTRAVENOUS
  Administered 2014-11-09: 100 ug via INTRAVENOUS

## 2014-11-09 MED ORDER — ROCURONIUM BROMIDE 50 MG/5ML IV SOLN
INTRAVENOUS | Status: AC
  Filled 2014-11-09: qty 1

## 2014-11-09 MED ORDER — LIDOCAINE HCL (CARDIAC) 20 MG/ML IV SOLN
INTRAVENOUS | Status: DC | PRN
  Administered 2014-11-09: 100 mg via INTRAVENOUS

## 2014-11-09 MED ORDER — DEXAMETHASONE SODIUM PHOSPHATE 4 MG/ML IJ SOLN
INTRAMUSCULAR | Status: DC | PRN
  Administered 2014-11-09: 4 mg via INTRAVENOUS

## 2014-11-09 MED ORDER — LACTATED RINGERS IV SOLN
INTRAVENOUS | Status: DC | PRN
  Administered 2014-11-09: 07:00:00 via INTRAVENOUS

## 2014-11-09 MED ORDER — METOPROLOL TARTRATE 1 MG/ML IV SOLN
INTRAVENOUS | Status: AC
  Filled 2014-11-09: qty 5

## 2014-11-09 MED ORDER — PHENYLEPHRINE HCL 10 MG/ML IJ SOLN
10.0000 mg | INTRAMUSCULAR | Status: DC | PRN
  Administered 2014-11-09: 20 ug/min via INTRAVENOUS

## 2014-11-09 SURGICAL SUPPLY — 26 items
BRUSH CYTOL CELLEBRITY 1.5X140 (MISCELLANEOUS) ×3 IMPLANT
CANISTER SUCTION 2500CC (MISCELLANEOUS) ×3 IMPLANT
CONT SPEC 4OZ CLIKSEAL STRL BL (MISCELLANEOUS) ×3 IMPLANT
COVER TABLE BACK 60X90 (DRAPES) ×3 IMPLANT
FORCEPS BIOP RJ4 1.8 (CUTTING FORCEPS) ×3 IMPLANT
GAUZE SPONGE 4X4 12PLY STRL (GAUZE/BANDAGES/DRESSINGS) ×3 IMPLANT
GLOVE BIOGEL M STRL SZ7.5 (GLOVE) ×3 IMPLANT
GOWN STRL REUS W/ TWL LRG LVL3 (GOWN DISPOSABLE) ×2 IMPLANT
GOWN STRL REUS W/TWL LRG LVL3 (GOWN DISPOSABLE) ×4
KIT CLEAN ENDO COMPLIANCE (KITS) ×3 IMPLANT
KIT ROOM TURNOVER OR (KITS) ×3 IMPLANT
MARKER SKIN DUAL TIP RULER LAB (MISCELLANEOUS) ×3 IMPLANT
NEEDLE BIOPSY TRANSBRONCH 21G (NEEDLE) IMPLANT
NEEDLE SYS SONOTIP II EBUSTBNA (NEEDLE) IMPLANT
NS IRRIG 1000ML POUR BTL (IV SOLUTION) ×3 IMPLANT
OIL SILICONE PENTAX (PARTS (SERVICE/REPAIRS)) IMPLANT
PAD ARMBOARD 7.5X6 YLW CONV (MISCELLANEOUS) ×6 IMPLANT
PENTAX OE-A52 ×6 IMPLANT
SYR 20CC LL (SYRINGE) ×3 IMPLANT
SYR 20ML ECCENTRIC (SYRINGE) ×9 IMPLANT
SYR 5ML LL (SYRINGE) ×3 IMPLANT
SYR 5ML LUER SLIP (SYRINGE) ×3 IMPLANT
TOWEL OR 17X24 6PK STRL BLUE (TOWEL DISPOSABLE) ×3 IMPLANT
TRAP SPECIMEN MUCOUS 40CC (MISCELLANEOUS) IMPLANT
TUBE CONNECTING 20'X1/4 (TUBING) ×2
TUBE CONNECTING 20X1/4 (TUBING) ×4 IMPLANT

## 2014-11-09 NOTE — Discharge Instructions (Addendum)
Flexible Bronchoscopy, Care After These instructions give you information on caring for yourself after your procedure. Your doctor may also give you more specific instructions. Call your doctor if you have any problems or questions after your procedure. HOME CARE  Do not eat or drink anything for 2 hours after your procedure. If you try to eat or drink before the medicine wears off, food or drink could go into your lungs. You could also burn yourself.  After 2 hours have passed and when you can cough and gag normally, you may eat soft food and drink liquids slowly.  The day after the test, you may eat your normal diet.  You may do your normal activities.  Keep all doctor visits. GET HELP RIGHT AWAY IF:  You get more and more short of breath.  You get light-headed.  You feel like you are going to pass out (faint).  You have chest pain.  You have new problems that worry you.  You cough up more than a little blood.  You cough up more blood than before. MAKE SURE YOU:  Understand these instructions.  Will watch your condition.  Will get help right away if you are not doing well or get worse. Document Released: 09/16/2009 Document Revised: 11/24/2013 Document Reviewed: 07/24/2013 [facility] Patient Information 2015 [place], [place]. This information is not intended to replace advice given to you by your health care provider. Make sure you discuss any questions you have with your health care provider.  Please call our office for any problems or questions. [phone]  What to eat:  For your first meals, you should eat lightly; only small meals initially.  If you do not have nausea, you may eat larger meals.  Avoid spicy, greasy and heavy food.    General Anesthesia, Adult, Care After  Refer to this sheet in the next few weeks. These instructions provide you with information on caring for yourself after your procedure. Your health care provider may also give you more specific  instructions. Your treatment has been planned according to current medical practices, but problems sometimes occur. Call your health care provider if you have any problems or questions after your procedure.  WHAT TO EXPECT AFTER THE PROCEDURE  After the procedure, it is typical to experience:  Sleepiness.  Nausea and vomiting. HOME CARE INSTRUCTIONS  For the first 24 hours after general anesthesia:  Have a responsible person with you.  Do not drive a car. If you are alone, do not take public transportation.  Do not drink alcohol.  Do not take medicine that has not been prescribed by your health care provider.  Do not sign important papers or make important decisions.  You may resume a normal diet and activities as directed by your health care provider.  Change bandages (dressings) as directed.  If you have questions or problems that seem related to general anesthesia, call the hospital and ask for the anesthetist or anesthesiologist on call. SEEK MEDICAL CARE IF:  You have nausea and vomiting that continue the day after anesthesia.  You develop a rash. SEEK IMMEDIATE MEDICAL CARE IF:  You have difficulty breathing.  You have chest pain.  You have any allergic problems. Document Released: 02/25/2001 Document Revised: 07/22/2013 Document Reviewed: 06/04/2013  [facility] Patient Information 2014 [place], [place].

## 2014-11-09 NOTE — Progress Notes (Signed)
Care of pt assumed by MA Cassara Nida RN from M. Brande RN 

## 2014-11-09 NOTE — H&P (View-Only) (Signed)
11/02/14- 62 yoM former smoker referred courtesy of Dr Lowne/Dr Larose Kells; abnormal PET scan, RUL lung nodule. Medical hx ETOH, SDH, intracerebral mass/ abscess/ craniotomy, sCHF, AFib, CAD/ CABG, HBP   Wife here  Difficult summer- Fell on coumadin, SDH w craniotomy, then second craniotomy for abscess. Now only on BASA for chronic AFib. Denies hx dx'd lung disease. Quit cigs 2014. Retired Biomedical engineer. Now persistent dry cough. Some DOE brisk walk/ stairs, not very active. No nodes, blood, fever. CXR then CT chest- RUL 3 cm nodule. PET confirms.   CT chest 09/16/14 IMPRESSION: Irregular nodular appearing lesion in the lateral segment right middle lobe measuring 3.0 x 2.6 cm. Neoplasm is suspected. There is an adjacent 5 mm nodular. Enlarged lymph node just anterior to the distal most aspect of the trachea toward the right. Several smaller mediastinal lymph nodes are present as well. Small right pleural effusion with right base atelectasis. There is underlying centrilobular emphysema. There are multiple foci of atherosclerotic change and coronary artery calcification. Given this combination of findings, correlation with nuclear medicine PET study may be advised to further evaluate. These results will be called to the ordering clinician or representative by the Radiologist Assistant, and communication documented in the PACS or zVision Dashboard. Electronically Signed  By: Lowella Grip M.D.  On: 09/16/2014 15:54  PET 10/27/14 IMPRESSION: 1. Hypermetabolic 3.2 x 2.1 cm macrolobulated and spiculated right upper lobe mass with subcarinal and right paratracheal lymphadenopathy. 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 2. The liver has a slightly shrunken appearance and nodular contour, suggestive of underlying cirrhosis. 3. Mild colonic diverticulosis without findings to suggest  acute diverticulitis at this time. 4. Atherosclerosis, including left main and 3 vessel coronary artery disease. Status post median sternotomy for CABG, including LIMA to the LAD. 5. Additional incidental findings, as above. Electronically Signed  By: Vinnie Langton M.D.  On: 10/27/2014 15:18 Office spirometry 11/02/14- Moderate obstruction. Also moderate restriction of exhaled volume (obessity and airtrapping ?) FEV1 1.98/ 60%, FEF25-75 48%  Prior to Admission medications   Medication Sig Start Date End Date Taking? Authorizing Provider  aspirin 81 MG tablet Take 81 mg by mouth daily.   Yes Historical Provider, MD  carvedilol (COREG) 12.5 MG tablet Take 12.5 mg by mouth 2 (two) times daily with a meal.    Yes Historical Provider, MD  FLUoxetine (PROZAC) 20 MG tablet Take 1 tablet (20 mg total) by mouth daily. 07/21/14  Yes Colon Branch, MD  folic acid (FOLVITE) 1 MG tablet Take 1 tablet (1 mg total) by mouth daily. 07/26/14  Yes Colon Branch, MD  furosemide (LASIX) 40 MG tablet Take one tablet by mouth one time daily 11/01/14  Yes [name], MD  levETIRAcetam (KEPPRA) 500 MG tablet Take 1 tablet (500 mg total) by mouth 2 (two) times daily. 07/21/14  Yes Colon Branch, MD  lisinopril (ZESTRIL) 2.5 MG tablet Take 1 tablet (2.5 mg total) by mouth daily. 06/08/14  Yes Liliane Shi, PA-C  Multiple Vitamin (MULTIVITAMIN WITH MINERALS) TABS tablet Take 1 tablet by mouth daily. 05/14/14  Yes Daniel J Angiulli, PA-C  pantoprazole (PROTONIX) 40 MG tablet Take 1 tablet (40 mg total) by mouth at bedtime. 07/02/14  Yes [name], MD  potassium chloride (K-DUR) 10 MEQ tablet Take 1 tablet daily 07/02/14  Yes [name], MD  simvastatin (ZOCOR) 40 MG tablet Take 1 tablet (40 mg total) by mouth  every evening. 09/01/14  Yes Colon Branch, MD   Past Medical History  Diagnosis Date  . Systolic heart failure   . 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 Dyslipidemia   . HTN  (hypertension)   . Atrial fibrillation   . Cancer     around nose  . Tobacco abuse   . Alcohol abuse   . H/O hiatal hernia   . Myocardial infarction   . Dysrhythmia     atrial fib  . Stroke 5/15  . CHF (congestive heart failure)   . Shortness of breath     occ  . GERD (gastroesophageal reflux disease)    Past Surgical History  Procedure Laterality Date  . Umbilical hernia repair  2010  . Hernia repair  2012  . Ventral hernia repair N/A 01/22/2013    Procedure: HERNIA REPAIR VENTRAL ADULT;  Surgeon: [name], MD;  Location: [place];  Service: General;  Laterality: N/A;  . Application of a-cell of chest/abdomen N/A 01/22/2013    Procedure: APPLICATION OF A-CELL OF CHEST/ABDOMEN;  Surgeon: [name], MD;  Location: [place];  Service: General;  Laterality: N/A;  . Cystoscopy N/A 01/22/2013    Procedure: Consuela Mimes;  Surgeon: [name], MD;  Location: [place];  Service: Urology;  Laterality: N/A;  Cystoscopy with balloon dilation. Insertion of coude catheter.  . Craniotomy Right 04/19/2014    Procedure: CRANIOTOMY HEMATOMA EVACUATION SUBDURAL;  Surgeon: [name], MD;  Location: [place] [facility];  Service: Neurosurgery;  Laterality: Right;  right  . Craniotomy Right 04/23/2014    Procedure: Craniotomy for Intracerebral Hemorrhage;  Surgeon: [name], MD;  Location: [place] [facility];  Service: Neurosurgery;  Laterality: Right;  . 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 [facility];  Service: Neurosurgery;  Laterality: N/A;  . Coronary artery bypass graft  12/24/2008    4 vessel  . Coronary artery bypass graft  2012?  Marland Kitchen Craniotomy N/A 09/13/2014    Procedure: CRANIOTOMY BONE FLAP/PROSTHETIC PLATE;  Surgeon: [name], MD;  Location: [place] [facility];  Service: Neurosurgery;  Laterality: N/A;   Family History  Problem Relation Age of Onset  . Diabetes Mother   . Heart disease Mother   .  Diabetes Father   . Heart disease Father   . Heart disease Brother     s/p CABG at 41  . Colon cancer Neg Hx   . Prostate cancer Neg Hx   . Rheum arthritis Daughter    History   Social History  . Marital Status: Married    Spouse Name: N/A    Number of Children: 2  . Years of Education: N/A   Occupational History  . [employer]   .     Social History Main Topics  . Smoking status: Former Smoker -- 1.00 packs/day for 50 years    Types: Cigarettes    Quit date: 04/19/2013  . Smokeless tobacco: Never Used  . Alcohol Use: No     Comment: quit  . Drug Use: No  . Sexual Activity: Not on file   Other Topics Concern  . Not on file   Social History Narrative   ROS-see HPI Constitutional:   No-   weight loss, night sweats, fevers, chills, fatigue, lassitude. HEENT:   No-  headaches, difficulty swallowing, tooth/dental  problems, sore throat,       No-  sneezing, itching, ear ache, nasal congestion, post nasal drip,  CV:  No-   chest pain, orthopnea, PND, swelling in lower extremities, anasarca,                                  dizziness, +palpitations Resp: + shortness of breath with exertion or at rest.              No-   productive cough,  + non-productive cough,  No- coughing up of blood.              No-   change in color of mucus.  No- wheezing.  Wife says he snores but doesn't                     stop breathing Skin: No-   rash or lesions. GI:  No-   heartburn, indigestion, abdominal pain, nausea, vomiting, diarrhea,                 change in bowel habits, loss of appetite GU: No-   dysuria, change in color of urine, no urgency or frequency.  No- flank pain. MS:  No-   joint pain or swelling.  No- decreased range of motion.  No- back pain. Neuro-     nothing unusual Psych:  No- change in mood or affect. No depression or anxiety.  No memory loss.  OBJ- Physical Exam General- Alert, Oriented, Affect-appropriate, Distress- none acute, +obese Skin-  rash-none, lesions- none, excoriation- none Lymphadenopathy- none Head- Scar R parietal            Eyes- Gross vision intact, PERRLA, conjunctivae and secretions clear            Ears- Hearing, canals-normal            Nose- Clear, no-Septal dev, mucus, polyps, erosion, perforation             Throat- Mallampati III , mucosa clear , drainage- none, tonsils- atrophic,                          +hoarse Neck- flexible , trachea midline, no stridor , thyroid nl, carotid no bruit Chest - symmetrical excursion , unlabored           Heart/CV- IRR , no murmur , no gallop  , no rub, nl s1 s2                           - JVD- none , edema- none, stasis changes- none, varices- none           Lung- clear to P&A, wheeze- none, cough- none , dullness-none, rub- none           Chest wall- +sternotomy scar Abd- tender-no, distended-no, bowel sounds-present, HSM- no, +obese Br/ Gen/ Rectal- Not done, not indicated Extrem- cyanosis- none, clubbing, none, atrophy- none, strength- nl Neuro- grossly intact to observation

## 2014-11-09 NOTE — Interval H&P Note (Signed)
PCCM Interval Note  Pt presents for RUL mass c/b intracranial lesion > bleed and then abscess. Has had dry cough but no other issues. Presents for tissue dx. Has discussed FOB with Dr Annamaria Boots.   Filed Vitals:   11/09/14 0608 11/09/14 0622  BP: 153/75   Pulse: 66   Temp: 97.8 F (36.6 C)   TempSrc: Oral   Resp: 20   Height:  5\' 8"  (1.727 m)  Weight: 109.77 kg (242 lb) 109.77 kg (242 lb)  SpO2: 100%     Recent Labs Lab 11/09/14 0658  HGB 12.1*  HCT 35.8*  WBC 7.4  PLT 208    Recent Labs Lab 11/09/14 0700  INR 1.06    09/16/14 IMPRESSION: Irregular nodular appearing lesion in the lateral segment right middle lobe measuring 3.0 x 2.6 cm. Neoplasm is suspected. There is an adjacent 5 mm nodular.  Enlarged lymph node just anterior to the distal most aspect of the trachea toward the right. Several smaller mediastinal lymph nodes are present as well.  Small right pleural effusion with right base atelectasis.  There is underlying centrilobular emphysema.  There are multiple foci of atherosclerotic change and coronary artery calcification.  Plan - EBUS and biopsies today. All questions answered.    Baltazar Apo, MD, PhD 11/09/2014, 7:34 AM  Pulmonary and Critical Care [phone] or if no answer [phone]

## 2014-11-09 NOTE — Anesthesia Preprocedure Evaluation (Addendum)
Anesthesia Evaluation  Patient identified by MRN, date of birth, ID band Patient awake    Reviewed: Allergy & Precautions, H&P , NPO status , Patient's Chart, lab work & pertinent test results, reviewed documented beta blocker date and time   Airway Mallampati: III  TM Distance: >3 FB Neck ROM: Full    Dental no notable dental hx. (+) Edentulous Upper, Edentulous Lower, Dental Advisory Given   Pulmonary COPDformer smoker,  breath sounds clear to auscultation  Pulmonary exam normal       Cardiovascular hypertension, Pt. on medications and Pt. on home beta blockers + CAD, + Past MI and +CHF + dysrhythmias Atrial Fibrillation Rhythm:Irregular Rate:Normal     Neuro/Psych Anxiety CVA negative psych ROS   GI/Hepatic Neg liver ROS, hiatal hernia, GERD-  Medicated and Controlled,  Endo/Other  negative endocrine ROS  Renal/GU negative Renal ROS  negative genitourinary   Musculoskeletal   Abdominal   Peds  Hematology negative hematology ROS (+)   Anesthesia Other Findings   Reproductive/Obstetrics negative OB ROS                            Anesthesia Physical Anesthesia Plan  ASA: III  Anesthesia Plan: General   Post-op Pain Management:    Induction: Intravenous  Airway Management Planned: Oral ETT  Additional Equipment:   Intra-op Plan:   Post-operative Plan: Extubation in OR  Informed Consent: I have reviewed the patients History and Physical, chart, labs and discussed the procedure including the risks, benefits and alternatives for the proposed anesthesia with the patient or authorized representative who has indicated his/her understanding and acceptance.   Dental advisory given  Plan Discussed with: CRNA  Anesthesia Plan Comments:         Anesthesia Quick Evaluation

## 2014-11-09 NOTE — Transfer of Care (Signed)
Immediate Anesthesia Transfer of Care Note  Patient: Alejandro [name]  Procedure(s) Performed: Procedure(s): VIDEO BRONCHOSCOPY WITH ENDOBRONCHIAL ULTRASOUND (N/A)  Patient Location: PACU  Anesthesia Type:General  Level of Consciousness: awake, alert , oriented and patient cooperative  Airway & Oxygen Therapy: Patient Spontanous Breathing and Patient connected to face mask oxygen  Post-op Assessment: Report given to PACU RN, Post -op Vital signs reviewed and stable and Patient moving all extremities X 4  Post vital signs: Reviewed and stable  Complications: No apparent anesthesia complications

## 2014-11-09 NOTE — Op Note (Signed)
Video Bronchoscopy with Endobronchial Ultrasound Procedure Note  Date of Operation: 11/09/2014  Pre-op Diagnosis: RUL mass, mediastinal LAD  Post-op Diagnosis: same  Surgeon: Baltazar Apo  Assistants: none  Anesthesia: General endotracheal anesthesia  Operation: Flexible video fiberoptic bronchoscopy with endobronchial ultrasound and biopsies.  Estimated Blood Loss: ~ 53GU  Complications: None apparent  Indications and History: Alejandro [name] is a 68 y.o. male referred by Dr Baird Lyons for biopsy of a R paratracheal lymph node and RUL mass found on CT scan chest .  The risks, benefits, complications, treatment options and expected outcomes were discussed with the patient.  The possibilities of pneumothorax, pneumonia, reaction to medication, pulmonary aspiration, perforation of a viscus, bleeding, failure to diagnose a condition and creating a complication requiring transfusion or operation were discussed with the patient who freely signed the consent.    Description of Procedure: The patient was examined in the preoperative area and history and data from the preprocedure consultation were reviewed. It was deemed appropriate to proceed.  The patient was taken to OR 14, identified as Alejandro [name] and the procedure verified as Flexible Video Fiberoptic Bronchoscopy.  A Time Out was held and the above information confirmed. General anesthesia was initiated and the patient  was orally intubated. The video fiberoptic bronchoscope was introduced via the endotracheal tube and a general inspection was performed which showed normal L sided airways. There was a small rounded endobronchial lesion on the surface of the RUL anterior segmental airway. This lesion was sampled with endobronchial brushings and biopsies. The standard scope was then withdrawn and the endobronchial ultrasound was used to identify and characterize the peritracheal, hilar and bronchial lymph nodes. Inspection showed slight  enlargement of a R pre-carinal node and station 7 node. Using real-time ultrasound guidance Wang needle biopsies were take from the precarinal lymph node and station 7 node for cytology. Initial frozen section evaluation of the needle biopsies showed lymphoid tissue without any evidence of malignancy. After this data was received the standard scope was reintroduced and under fluoroscopic guidance right upper lobe transbronchial biopsies and brushings were performed in the region of the right upper lobe mass. There was moderate bleeding from the right upper lobe anterior segmental airway. The bleeding subsided with good hemostasis by the end of the case.The patient tolerated the procedure well without apparent complications.  The bronchoscope was withdrawn. Anesthesia was reversed and the patient was taken to the PACU for recovery. A CXR is pending.   Samples: 1. Wang needle biopsies from R pre-carinal node 2. Wang needle biopsies from 7 node 3. Right upper lobe endobronchial biopsies 4. Right upper lobe endobronchial brushings 5. Right upper lobe transbronchial biopsies 6. Right upper lobe transbronchial brushings  Plans:  The patient will be discharged from the PACU to home when recovered from anesthesia. We will review the cytology, pathology results with the patient when they become available. Outpatient followup will be with Dr Annamaria Boots or Dr [name].   Baltazar Apo, MD, PhD 11/09/2014, 9:57 AM [facility] [phone] or if no answer [phone]

## 2014-11-09 NOTE — Anesthesia Postprocedure Evaluation (Signed)
  Anesthesia Post-op Note  Patient: Alejandro [name]  Procedure(s) Performed: Procedure(s): VIDEO BRONCHOSCOPY WITH ENDOBRONCHIAL ULTRASOUND (N/A)  Patient Location: PACU  Anesthesia Type:General  Level of Consciousness: awake and alert   Airway and Oxygen Therapy: Patient Spontanous Breathing  Post-op Pain: none  Post-op Assessment: Post-op Vital signs reviewed, Patient's Cardiovascular Status Stable and Respiratory Function Stable  Post-op Vital Signs: Reviewed  Filed Vitals:   11/09/14 1015  BP: 136/83  Pulse: 79  Temp:   Resp: 21    Complications: No apparent anesthesia complications

## 2014-11-10 ENCOUNTER — Encounter (HOSPITAL_COMMUNITY): Payer: Self-pay | Admitting: Emergency Medicine

## 2014-11-12 ENCOUNTER — Telehealth: Payer: Self-pay | Admitting: Internal Medicine

## 2014-11-12 ENCOUNTER — Telehealth: Payer: Self-pay | Admitting: Emergency Medicine

## 2014-11-12 DIAGNOSIS — R911 Solitary pulmonary nodule: Secondary | ICD-10-CM

## 2014-11-12 NOTE — Telephone Encounter (Signed)
Spoke with the patient and reviewed results - no evidence of malignancy. Explained that while this is good news there is potential for false negative. Given our index of suspicion we may need to arrange for an alternative biopsy. I told him that I would discuss with Dr Annamaria Boots and we would decide next steps. Please arrange for him to see Aisia Correira, double book if needed

## 2014-11-12 NOTE — Telephone Encounter (Signed)
I called and spoke with Alejandro [name]. I explained that Dr [name] had called me today reporting that cytopath from his bronch has returned negative. Dr [name] is considering another bronch with different technique, and that Dr [name] has agreed to follow Alejandro [name] in the office, since he is the one doing the procedures. Alejandro [name] understands the imaging favors this being a lung cancer that has probably spread to lymph nodes and that probably treatment will be chemo/XRT rather than surgery. The tissue diagnosis will be important to guide treatment. If a second bronch approach is not appropriate per Dr [name], then discussion at Thoracic Oncology conf and possibly risk of needle bx should be considered next.  I asked Alejandro [name] to call us if he had not heard about plans/ scheduling with Dr [name] by end of day Monday.

## 2014-11-12 NOTE — Telephone Encounter (Signed)
Spoke with patients wife-she was made aware that not all results were back and I would work on getting the results(the first time she called) and was fine waiting until appt on 11/17/14. Pt's wife then called back upset stating she needed to know the results today and unsure why a phone person would answer her questions. I explained to patients wife that I am 73 CMA and I had identified myself to her at the previous phone call.   Wife is aware that I will speak with CY today to have him call with results as looking down further in the chart the results are there.    CY please advise. Thanks.

## 2014-11-12 NOTE — Telephone Encounter (Signed)
Called and spoke to pt. Informed pt of the results and recs per RB. Appt made with RB on 11/16/14. Pt verbalized understanding and denied any further questions or concerns at this time.

## 2014-11-15 NOTE — Telephone Encounter (Addendum)
lmtcb x 1 Order placed for CT scan

## 2014-11-15 NOTE — Telephone Encounter (Signed)
Return call again.. Is he to do ct b/4 ov or after.Hillery Hunter

## 2014-11-15 NOTE — Telephone Encounter (Signed)
lmtcb for pt.  

## 2014-11-15 NOTE — Telephone Encounter (Signed)
I spoke with pt spouse and advised that ct will be done before. She has specific times needed so I placed this in the order. Nothing further needed. [place] Bing, CMA

## 2014-11-15 NOTE — Telephone Encounter (Signed)
Pt wife calling back.Alejandro [name]

## 2014-11-15 NOTE — Telephone Encounter (Signed)
I'm seeing him tomorrow in office - please order a Ct Chest, no contrast, SuperD protocol at [facility]. Once this is done we can arrange for ENB

## 2014-11-16 ENCOUNTER — Encounter: Payer: Self-pay | Admitting: Emergency Medicine

## 2014-11-16 ENCOUNTER — Ambulatory Visit
Admission: RE | Admit: 2014-11-16 | Discharge: 2014-11-16 | Disposition: A | Discharge status: Home / Self Care | Payer: BC Managed Care – PPO | Source: Ambulatory Visit | Attending: Emergency Medicine | Admitting: Emergency Medicine

## 2014-11-16 ENCOUNTER — Ambulatory Visit: Payer: Self-pay | Admitting: Emergency Medicine

## 2014-11-16 ENCOUNTER — Other Ambulatory Visit: Payer: Self-pay | Admitting: Cardiovascular Disease

## 2014-11-16 VITALS — BP 122/68 | HR 113 | Ht 68.0 in | Wt 241.0 lb

## 2014-11-16 DIAGNOSIS — R911 Solitary pulmonary nodule: Secondary | ICD-10-CM

## 2014-11-16 NOTE — Progress Notes (Signed)
HPI: 68 yo man follows for RUL mass and mediastinal LAD. We performed EBUS, got good nodal tissue but no dx. He follows now to discuss next options. He had a repeat CT scan today. He is feeling well s/p our bronchoscopy.   Past Medical History  Diagnosis Date  . Systolic heart failure   . 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 Dyslipidemia   . HTN (hypertension)   . Atrial fibrillation   . Tobacco abuse   . Alcohol abuse   . H/O hiatal hernia   . Myocardial infarction   . Dysrhythmia     atrial fib  . Stroke 5/15  . CHF (congestive heart failure)   . Shortness of breath     occ  . GERD (gastroesophageal reflux disease)   . Cancer     around nose.  New dx of lung cancer     Family History  Problem Relation Age of Onset  . Diabetes Mother   . Heart disease Mother   . Diabetes Father   . Heart disease Father   . Heart disease Brother     s/p CABG at 72  . Colon cancer Neg Hx   . Prostate cancer Neg Hx   . Rheum arthritis Daughter      History   Social History  . Marital Status: Married    Spouse Name: N/A    Number of Children: 2  . Years of Education: N/A   Occupational History  . [employer]   .     Social History Main Topics  . Smoking status: Former Smoker -- 1.00 packs/day for 50 years    Types: Cigarettes    Quit date: 04/19/2013  . Smokeless tobacco: Never Used  . Alcohol Use: No     Comment: quit  . Drug Use: No  . Sexual Activity: Not on file   Other Topics Concern  . Not on file   Social History Narrative     No Known Allergies   Outpatient Prescriptions Prior to Visit  Medication Sig Dispense Refill  . aspirin 81 MG tablet Take 81 mg by mouth daily.    . carvedilol (COREG) 12.5 MG tablet Take 12.5 mg by mouth 2 (two) times daily with a meal.     . FLUoxetine (PROZAC) 20 MG tablet Take 1 tablet (20 mg total) by mouth daily. 30 tablet 6  . folic acid (FOLVITE) 1 MG tablet Take 1 tablet (1 mg total)  by mouth daily. 30 tablet 3  . furosemide (LASIX) 40 MG tablet Take one tablet by mouth one time daily 30 tablet 5  . levETIRAcetam (KEPPRA) 500 MG tablet Take 1 tablet (500 mg total) by mouth 2 (two) times daily. 60 tablet 6  . lisinopril (ZESTRIL) 2.5 MG tablet Take 1 tablet (2.5 mg total) by mouth daily. 30 tablet 6  . Multiple Vitamin (MULTIVITAMIN WITH MINERALS) TABS tablet Take 1 tablet by mouth daily.    . pantoprazole (PROTONIX) 40 MG tablet Take 1 tablet (40 mg total) by mouth at bedtime. 30 tablet 6  . potassium chloride (K-DUR) 10 MEQ tablet Take 1 tablet daily (Patient taking differently: Take 10 mEq by mouth daily. ) 30 tablet 3  . simvastatin (ZOCOR) 40 MG tablet Take 1 tablet (40 mg total) by mouth every evening. 30 tablet 4   No facility-administered medications prior to visit.    Filed Vitals:   11/16/14 1544  BP: 122/68  Pulse: 113  Height: 5\' 8"  (1.727 m)  Weight: 241 lb (109.317 kg)  SpO2: 95%   Gen: Pleasant, well-nourished, in no distress,  normal affect  ENT: No lesions,  mouth clear,  oropharynx clear, no postnasal drip  Neck: No JVD, no TMG, no carotid bruits  Lungs: No use of accessory muscles, clear without rales or rhonchi  Cardiovascular: RRR, heart sounds normal, no murmur or gallops, no peripheral edema  Musculoskeletal: No deformities, no cyanosis or clubbing  Neuro: alert, non focal  Skin: Warm, no lesions or rashes   Nodule of right lung Patient has undergone biopsy via EBUS that was non-diagnostic. Discussed the results with Dr Annamaria Boots - suspicion for lung CA is still significant. We discussed undergoing repeat bronchoscopy with navigation and biopsies. The nodule would also be amenable to TTNA. We agreed to pursue needle bx. If this fails then we will repeat his FOB + ENB.

## 2014-11-16 NOTE — Patient Instructions (Signed)
We will refer for CT-guided needle biopsy of your right lung mass.  We will discuss the plans and results by phone.  Follow with Dr [name] in 1 month

## 2014-11-16 NOTE — Assessment & Plan Note (Addendum)
Patient has undergone biopsy via EBUS that was non-diagnostic. Discussed the results with Dr Annamaria Boots - suspicion for lung CA is still significant. We discussed undergoing repeat bronchoscopy with navigation and biopsies. The nodule would also be amenable to TTNA. We agreed to pursue needle bx. If this fails then we will repeat his FOB + ENB.

## 2014-11-17 ENCOUNTER — Inpatient Hospital Stay: Payer: BC Managed Care – PPO | Admitting: Internal Medicine

## 2014-11-18 ENCOUNTER — Telehealth: Payer: Self-pay | Admitting: Emergency Medicine

## 2014-11-18 ENCOUNTER — Other Ambulatory Visit: Payer: Self-pay

## 2014-11-18 NOTE — Telephone Encounter (Signed)
Called and spoke to pt's wife, Pamala Hurry. Pamala Hurry questioned if there is an earlier date that pt can have CT biopsy. Called scheduling at [facility] and was informed there are no earlier dates. Informed pt's wife that there is nothing sooner available for the CT biopsy. Pamala Hurry verbalized understanding and denied any further questions or concerns at this time.

## 2014-11-22 ENCOUNTER — Ambulatory Visit: Payer: BC Managed Care – PPO | Admitting: Internal Medicine

## 2014-11-24 ENCOUNTER — Other Ambulatory Visit: Payer: Self-pay | Admitting: Radiology

## 2014-11-25 ENCOUNTER — Other Ambulatory Visit: Payer: Self-pay | Admitting: Radiology

## 2014-11-29 ENCOUNTER — Ambulatory Visit (HOSPITAL_COMMUNITY)
Admission: RE | Admit: 2014-11-29 | Discharge: 2014-11-29 | Disposition: A | Discharge status: Home / Self Care | Payer: BC Managed Care – PPO | Source: Ambulatory Visit | Attending: Emergency Medicine | Admitting: Emergency Medicine

## 2014-11-29 ENCOUNTER — Ambulatory Visit (HOSPITAL_COMMUNITY)
Admission: RE | Admit: 2014-11-29 | Discharge: 2014-11-29 | Disposition: A | Discharge status: Home / Self Care | Payer: BC Managed Care – PPO | Source: Ambulatory Visit | Attending: Interventional Radiology | Admitting: Interventional Radiology

## 2014-11-29 ENCOUNTER — Ambulatory Visit (HOSPITAL_COMMUNITY): Admission: RE | Admit: 2014-11-29 | Payer: BC Managed Care – PPO | Source: Ambulatory Visit

## 2014-11-29 DIAGNOSIS — Z85828 Personal history of other malignant neoplasm of skin: Secondary | ICD-10-CM | POA: Diagnosis not present

## 2014-11-29 DIAGNOSIS — Z951 Presence of aortocoronary bypass graft: Secondary | ICD-10-CM | POA: Diagnosis not present

## 2014-11-29 DIAGNOSIS — Z87891 Personal history of nicotine dependence: Secondary | ICD-10-CM | POA: Diagnosis not present

## 2014-11-29 DIAGNOSIS — J432 Centrilobular emphysema: Secondary | ICD-10-CM | POA: Insufficient documentation

## 2014-11-29 DIAGNOSIS — R599 Enlarged lymph nodes, unspecified: Secondary | ICD-10-CM | POA: Insufficient documentation

## 2014-11-29 DIAGNOSIS — Z7982 Long term (current) use of aspirin: Secondary | ICD-10-CM | POA: Diagnosis not present

## 2014-11-29 DIAGNOSIS — I509 Heart failure, unspecified: Secondary | ICD-10-CM | POA: Diagnosis not present

## 2014-11-29 DIAGNOSIS — I10 Essential (primary) hypertension: Secondary | ICD-10-CM | POA: Diagnosis not present

## 2014-11-29 DIAGNOSIS — K219 Gastro-esophageal reflux disease without esophagitis: Secondary | ICD-10-CM | POA: Diagnosis not present

## 2014-11-29 DIAGNOSIS — F1010 Alcohol abuse, uncomplicated: Secondary | ICD-10-CM | POA: Insufficient documentation

## 2014-11-29 DIAGNOSIS — Z79899 Other long term (current) drug therapy: Secondary | ICD-10-CM | POA: Insufficient documentation

## 2014-11-29 DIAGNOSIS — J95811 Postprocedural pneumothorax: Secondary | ICD-10-CM

## 2014-11-29 DIAGNOSIS — M4810 Ankylosing hyperostosis [Forestier], site unspecified: Secondary | ICD-10-CM | POA: Diagnosis not present

## 2014-11-29 DIAGNOSIS — I252 Old myocardial infarction: Secondary | ICD-10-CM | POA: Diagnosis not present

## 2014-11-29 DIAGNOSIS — I4891 Unspecified atrial fibrillation: Secondary | ICD-10-CM | POA: Insufficient documentation

## 2014-11-29 DIAGNOSIS — Z8673 Personal history of transient ischemic attack (TIA), and cerebral infarction without residual deficits: Secondary | ICD-10-CM | POA: Insufficient documentation

## 2014-11-29 DIAGNOSIS — I2510 Atherosclerotic heart disease of native coronary artery without angina pectoris: Secondary | ICD-10-CM | POA: Diagnosis not present

## 2014-11-29 DIAGNOSIS — E785 Hyperlipidemia, unspecified: Secondary | ICD-10-CM | POA: Diagnosis not present

## 2014-11-29 DIAGNOSIS — R911 Solitary pulmonary nodule: Secondary | ICD-10-CM | POA: Insufficient documentation

## 2014-11-29 LAB — PROTIME-INR
INR: 1.09 (ref 0.00–1.49)
Prothrombin Time: 14.3 seconds (ref 11.6–15.2)

## 2014-11-29 LAB — APTT: aPTT: 34 seconds (ref 24–37)

## 2014-11-29 LAB — CBC
HEMATOCRIT: 34.6 % — AB (ref 39.0–52.0)
HEMOGLOBIN: 11.7 g/dL — AB (ref 13.0–17.0)
MCH: 27.4 pg (ref 26.0–34.0)
MCHC: 33.8 g/dL (ref 30.0–36.0)
MCV: 81 fL (ref 78.0–100.0)
Platelets: 186 10*3/uL (ref 150–400)
RBC: 4.27 MIL/uL (ref 4.22–5.81)
RDW: 14.4 % (ref 11.5–15.5)
WBC: 7.4 10*3/uL (ref 4.0–10.5)

## 2014-11-29 MED ORDER — SODIUM CHLORIDE 0.9 % IV SOLN
INTRAVENOUS | Status: DC
  Administered 2014-11-29: 10:00:00 via INTRAVENOUS

## 2014-11-29 MED ORDER — FENTANYL CITRATE 0.05 MG/ML IJ SOLN
INTRAMUSCULAR | Status: AC | PRN
  Administered 2014-11-29 (×2): 50 ug via INTRAVENOUS

## 2014-11-29 MED ORDER — MIDAZOLAM HCL 2 MG/2ML IJ SOLN
INTRAMUSCULAR | Status: AC | PRN
  Administered 2014-11-29 (×2): 1 mg via INTRAVENOUS

## 2014-11-29 MED ORDER — MIDAZOLAM HCL 2 MG/2ML IJ SOLN
INTRAMUSCULAR | Status: AC
  Filled 2014-11-29: qty 4

## 2014-11-29 MED ORDER — FENTANYL CITRATE 0.05 MG/ML IJ SOLN
INTRAMUSCULAR | Status: AC
  Filled 2014-11-29: qty 4

## 2014-11-29 NOTE — Sedation Documentation (Signed)
Patient denies pain and is resting comfortably.  

## 2014-11-29 NOTE — H&P (Signed)
Chief Complaint: "I'm having a lung biopsy"  Referring Physician(s): Byrum,Robert S  History of Present Illness: Alejandro [name] is a 68 y.o. male , prior smoker, with hypermetabolic RUL lung mass and associated adenopathy/recent negative bronchoscopy who presents today for CT guided RUL lung mass biopsy.  Past Medical History  Diagnosis Date  . Systolic heart failure   . 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 Dyslipidemia   . HTN (hypertension)   . Atrial fibrillation   . Tobacco abuse   . Alcohol abuse   . H/O hiatal hernia   . Myocardial infarction   . Dysrhythmia     atrial fib  . Stroke 5/15  . CHF (congestive heart failure)   . Shortness of breath     occ  . GERD (gastroesophageal reflux disease)   . Cancer     around nose.  New dx of lung cancer    Past Surgical History  Procedure Laterality Date  . Umbilical hernia repair  2010  . Hernia repair  2012  . Ventral hernia repair N/A 01/22/2013    Procedure: HERNIA REPAIR VENTRAL ADULT;  Surgeon: [name], MD;  Location: [place];  Service: General;  Laterality: N/A;  . Application of a-cell of chest/abdomen N/A 01/22/2013    Procedure: APPLICATION OF A-CELL OF CHEST/ABDOMEN;  Surgeon: [name], MD;  Location: [place];  Service: General;  Laterality: N/A;  . Cystoscopy N/A 01/22/2013    Procedure: Consuela Mimes;  Surgeon: [name], MD;  Location: [place];  Service: Urology;  Laterality: N/A;  Cystoscopy with balloon dilation. Insertion of coude catheter.  . Craniotomy Right 04/19/2014    Procedure: CRANIOTOMY HEMATOMA EVACUATION SUBDURAL;  Surgeon: [name], MD;  Location: [facility];  Service: Neurosurgery;  Laterality: Right;  right  . Craniotomy Right 04/23/2014    Procedure: Craniotomy for Intracerebral Hemorrhage;  Surgeon: [name], MD;  Location: [place] [facility];  Service: Neurosurgery;  Laterality: Right;  . 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 Coronary artery bypass graft  12/24/2008    4 vessel  . Coronary artery bypass graft  2012?  Marland Kitchen Craniotomy N/A 09/13/2014    Procedure: CRANIOTOMY BONE FLAP/PROSTHETIC PLATE;  Surgeon: [name], MD;  Location: [place] [facility];  Service: Neurosurgery;  Laterality: N/A;  . Video bronchoscopy with endobronchial ultrasound N/A 11/09/2014    Procedure: VIDEO BRONCHOSCOPY WITH ENDOBRONCHIAL ULTRASOUND;  Surgeon: Collene Gobble, MD;  Location: [facility];  Service: Thoracic;  Laterality: N/A;    Allergies: Review of patient's allergies indicates no known allergies.  Medications: Prior to Admission medications   Medication Sig Start Date End Date Taking? Authorizing Provider  carvedilol (COREG) 12.5 MG tablet Take 12.5 mg by mouth 2 (two) times daily with a meal.    Yes Historical Provider, MD  FLUoxetine (PROZAC) 20 MG tablet Take 1 tablet (20 mg total) by mouth daily. 07/21/14  Yes Colon Branch, MD  folic acid (FOLVITE) 1 MG tablet Take 1 tablet (1 mg total) by mouth daily. 07/26/14  Yes Colon Branch, MD  furosemide (LASIX) 40 MG tablet Take one tablet by mouth one time daily 11/01/14  Yes Blane Ohara, MD  levETIRAcetam (KEPPRA) 500 MG tablet Take 1 tablet (500  mg total) by mouth 2 (two) times daily. 07/21/14  Yes Colon Branch, MD  lisinopril (ZESTRIL) 2.5 MG tablet Take 1 tablet (2.5 mg total) by mouth daily. 06/08/14  Yes Liliane Shi, PA-C  Multiple Vitamin (MULTIVITAMIN WITH MINERALS) TABS tablet Take 1 tablet by mouth daily. 05/14/14  Yes Daniel J Angiulli, PA-C  pantoprazole (PROTONIX) 40 MG tablet Take 1 tablet (40 mg total) by mouth at bedtime. 07/02/14  Yes Blane Ohara, MD  potassium chloride (K-DUR,KLOR-CON) 10 MEQ tablet TAKE ONE TABLET BY MOUTH ONE TIME DAILY  11/17/14  Yes Blane Ohara, MD  simvastatin (ZOCOR) 40 MG tablet Take 1 tablet (40 mg total)  by mouth every evening. 09/01/14  Yes Colon Branch, MD  aspirin 81 MG tablet Take 81 mg by mouth daily.    Historical Provider, MD    Family History  Problem Relation Age of Onset  . Diabetes Mother   . Heart disease Mother   . Diabetes Father   . Heart disease Father   . Heart disease Brother     s/p CABG at 70  . Colon cancer Neg Hx   . Prostate cancer Neg Hx   . Rheum arthritis Daughter     History   Social History  . Marital Status: Married    Spouse Name: N/A    Number of Children: 2  . Years of Education: N/A   Occupational History  . [employer]   .     Social History Main Topics  . Smoking status: Former Smoker -- 1.00 packs/day for 50 years    Types: Cigarettes    Quit date: 04/19/2013  . Smokeless tobacco: Never Used  . Alcohol Use: No     Comment: quit  . Drug Use: No  . Sexual Activity: Not on file   Other Topics Concern  . Not on file   Social History Narrative        Review of Systems  Constitutional: Negative for fever, chills and unexpected weight change.  HENT: Positive for sore throat.   Respiratory: Positive for cough.        Some dyspnea with exertion; denies hemoptysis  Cardiovascular: Negative for chest pain.  Gastrointestinal: Negative for nausea, vomiting, abdominal pain and blood in stool.  Genitourinary: Negative for dysuria and hematuria.  Musculoskeletal: Negative for back pain.  Neurological: Negative for headaches.  Hematological: Does not bruise/bleed easily.    Vital Signs: BP 120/75 mmHg  Pulse 93  Temp(Src) 97.9 F (36.6 C) (Oral)  Resp 18  Ht 5\' 8"  (1.727 m)  Wt 240 lb (108.863 kg)  BMI 36.50 kg/m2  SpO2 98%  Physical Exam  Constitutional: He is oriented to person, place, and time. He appears well-developed and well-nourished.  Cardiovascular: Normal rate.   irregular  Pulmonary/Chest: Effort normal and breath sounds normal.  Abdominal: Soft. Bowel sounds are normal. There is no tenderness.    obese  Musculoskeletal: Normal range of motion. He exhibits no edema.  Neurological: He is alert and oriented to person, place, and time.    Imaging: Dg Chest Port 1 View  11/09/2014   CLINICAL DATA:  68 year old male status post biopsy of right upper lobe mass via bronchoscopy. Initial encounter.  EXAM: PORTABLE CHEST - 1 VIEW  COMPARISON:  PET-CT 10/27/2014 and earlier.  FINDINGS: Portable AP upright view at 1000 hrs. Right midlung nodule is less apparent. No pneumothorax. Mildly lower lung volumes. Stable cardiac size and mediastinal contours. Sequelae of  CABG. No pleural effusion or confluent pulmonary opacity.  IMPRESSION: No pneumothorax or adverse features identified status post right lung bronchoscopic biopsy.   Electronically Signed   By: Lars Pinks M.D.   On: 11/09/2014 10:16   Ct Super D Chest Wo Contrast  11/16/2014   CLINICAL DATA:  Followup of pulmonary nodule.  ICD10: R 91.1  EXAM: CT CHEST WITHOUT CONTRAST  TECHNIQUE: Multidetector CT imaging of the chest was performed using thin slice collimation for electromagnetic bronchoscopy planning purposes, without intravenous contrast.  COMPARISON:  Chest radiograph of 11/09/2014. PET of 10/27/2014. Chest CT of 09/16/2014.  FINDINGS: Lungs/Pleura:  Mild centrilobular emphysema.  Spiculated mass within the inferior lateral aspect of the right upper lobe measures 3.0 x 2.1 cm on image 32 of series 4. This contacts the right minor fissure.  There is adjacent nodularity in the right middle lobe on image 33 at approximately 4 mm. This is unchanged. A separate area of apparent nodularity on image 43 is secondary to right major fissure thickening. Clear left lung.  No pleural fluid.  Heart/Mediastinum: Prior median sternotomy. Aortic and branch vessel atherosclerosis. Mild cardiomegaly. Pulmonary artery enlargement, with the outflow tract measuring 3.3 cm.  Low right paratracheal node is enlarged at 1.7 cm, similar.  A subcarinal node measures 2.0 cm,  grossly similar. Hilar regions poorly evaluated without intravenous contrast.  Upper Abdomen: Suspicion of cirrhosis. Normal imaged portions of the spleen, stomach, pancreas, gallbladder, adrenal glands. Advanced abdominal aortic and branch vessel atherosclerosis.  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  IMPRESSION: 1. Inferior right upper lobe lung mass, most consistent with primary bronchogenic carcinoma. 2. Thoracic adenopathy, corresponding to hypermetabolism at recent PET. Most consistent with nodal metastasis. No new findings. 3. Mild cirrhosis. 4. Pulmonary artery enlargement suggests pulmonary arterial hypertension.   Electronically Signed   By: Abigail Miyamoto M.D.   On: 11/16/2014 15:17   Dg C-arm Bronchoscopy  11/09/2014   CLINICAL DATA:    C-ARM BRONCHOSCOPY  Fluoroscopy was utilized by the requesting physician.  No radiographic  interpretation.     Labs:  CBC:  Recent Labs  06/18/14 0245 06/20/14 1307 09/07/14 1045 11/09/14 0658  WBC 11.4* 8.8 7.2 7.4  HGB 10.5* 10.5* 11.5* 12.1*  HCT 31.9* 31.5* 33.7* 35.8*  PLT 206 171 198 208    COAGS:  Recent Labs  04/21/14 0319 04/22/14 0300 06/16/14 0441 11/09/14 0700  INR 1.02 1.21 1.10 1.06    BMP:  Recent Labs  06/18/14 0245 06/20/14 1307 09/07/14 1045 09/23/14 0922 11/09/14 0658  NA 138 139 137 136 139  K 4.0 3.4* 5.1 4.3 4.5  CL 100 98 101 100 100  CO2 24 24 25 30 25   GLUCOSE 154* 122* 105* 90 99  BUN 10 6 21 19 21   CALCIUM 8.8 9.2 9.5 9.6 9.6  CREATININE 0.83 0.82 1.19 1.2 1.32  GFRNONAA 88* 89* 61*  --  53*  GFRAA >90 >90 70*  --  62*    LIVER FUNCTION TESTS:  Recent Labs  04/28/14 0500 04/29/14 0308 05/05/14 0515 09/23/14 0922  BILITOT 0.5 0.8 0.6 0.4  AST 45* 47* 27 24  ALT 30 36 65* 18  ALKPHOS 72 84 67 68  PROT 5.8* 6.9 5.6* 7.6  ALBUMIN 2.2* 2.5* 2.3* 3.5   11/29/14 labs pending TUMOR MARKERS: No results for input(s): AFPTM, CEA,  CA199, CHROMGRNA in the last 8760 hours.  Assessment and Plan: Alejandro [name] is a 68 y.o. male ,  prior smoker, with hypermetabolic RUL lung mass and associated adenopathy/recent negative bronchoscopy who presents today for CT guided RUL lung mass biopsy. Details/risks of procedure d/w pt/wife with their understanding and consent.      Signed: Autumn Messing 11/29/2014, 10:14 AM

## 2014-11-29 NOTE — Discharge Instructions (Signed)
Needle Biopsy of Lung, Care After °Refer to this sheet in the next few weeks. These instructions provide you with information on caring for yourself after your procedure. Your health care provider may also give you more specific instructions. Your treatment has been planned according to current medical practices, but problems sometimes occur. Call your health care provider if you have any problems or questions after your procedure. °WHAT TO EXPECT AFTER THE PROCEDURE °· A bandage will be applied over the area where the needle was inserted. You may be asked to apply pressure to the bandage for several minutes to ensure there is minimal bleeding. °· In most cases, you can leave when your needle biopsy procedure is completed. Do not drive yourself home. Someone else should take you home. °· If you received an IV sedative or general anesthetic, you will be taken to a comfortable place to relax while the medicine wears off. °· If you have upcoming travel scheduled, talk to your health care provider about when it is safe to travel by air after the procedure. °HOME CARE INSTRUCTIONS °· Expect to take it easy for the rest of the day. °· Protect the area where you received the needle biopsy by keeping the bandage in place for as long as instructed. °· You may feel some mild pain or discomfort in the area, but this should stop in a day or two. °· Take medicines only as directed by your health care provider. °SEEK MEDICAL CARE IF:  °· You have pain at the biopsy site that worsens or is not helped by medicine. °· You have swelling or drainage at the needle biopsy site. °· You have a fever. °SEEK IMMEDIATE MEDICAL CARE IF:  °· You have new or worsening shortness of breath. °· You have chest pain. °· You are coughing up blood. °· You have bleeding that does not stop with pressure or a bandage. °· You develop light-headedness or fainting. °Document Released: 09/16/2007 Document Revised: 04/05/2014 Document Reviewed:  04/13/2013 °Exit[employer]® Patient Information ©2015 [employer]. This information is not intended to replace advice given to you by your health care provider. Make sure you discuss any questions you have with your health care provider. ° °

## 2014-11-29 NOTE — Procedures (Signed)
Interventional Radiology Procedure Note  Procedure:  CT guided bx of right upper lobe nodule.   2 x 18 G core.  Complications: No immediate Recommendations:  - Ok to shower tomorrow - CXR in 2 hours - NPO for 3 hours   Signed,  Dulcy Fanny. Earleen Newport, DO

## 2014-11-29 NOTE — Progress Notes (Signed)
Dr.Wagner called to ok discharge of pt.  Pt has stable VS,  O2 sats WNL, no respiratory distress

## 2014-11-29 NOTE — Progress Notes (Signed)
CXR results called to Deborra Medina.  Lennette Bihari to speak with Dr. Earleen Newport

## 2014-12-06 ENCOUNTER — Telehealth: Payer: Self-pay | Admitting: Emergency Medicine

## 2014-12-06 DIAGNOSIS — C3490 Malignant neoplasm of unspecified part of unspecified bronchus or lung: Secondary | ICD-10-CM

## 2014-12-06 NOTE — Telephone Encounter (Signed)
Dr [name], please advise pt on CT biopsy results.  Results in EMR

## 2014-12-06 NOTE — Telephone Encounter (Signed)
Please call pt with results asap keeps calling stating that this is just unexceptable having to wait this long for results.Alejandro [name]

## 2014-12-06 NOTE — Telephone Encounter (Signed)
I discussed the results with Alejandro [name] today. 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 They will probably decide to seek care at [facility], but would like to initially see Dr. Julien Nordmann at the [facility]. I will refer him to see Dr. Julien Nordmann to discuss options for treatment, either in [place] or [place].

## 2014-12-06 NOTE — Telephone Encounter (Signed)
Spoke with pt's daughter and advised that Dr [name] is in the office this afternoon and he has been advised to call them regarding results.

## 2014-12-07 ENCOUNTER — Other Ambulatory Visit: Payer: Self-pay | Admitting: *Deleted

## 2014-12-07 ENCOUNTER — Telehealth: Payer: Self-pay | Admitting: *Deleted

## 2014-12-07 DIAGNOSIS — R911 Solitary pulmonary nodule: Secondary | ICD-10-CM

## 2014-12-07 NOTE — Telephone Encounter (Signed)
Called left vm message to call me with my name and phone number

## 2014-12-16 ENCOUNTER — Encounter: Payer: Self-pay | Admitting: Internal Medicine

## 2014-12-16 ENCOUNTER — Ambulatory Visit (HOSPITAL_BASED_OUTPATIENT_CLINIC_OR_DEPARTMENT_OTHER): Payer: BLUE CROSS/BLUE SHIELD | Admitting: Internal Medicine

## 2014-12-16 ENCOUNTER — Ambulatory Visit: Payer: BLUE CROSS/BLUE SHIELD | Attending: Internal Medicine | Admitting: Physical Therapy

## 2014-12-16 ENCOUNTER — Ambulatory Visit
Admission: RE | Admit: 2014-12-16 | Discharge: 2014-12-16 | Disposition: A | Discharge status: Home / Self Care | Payer: BLUE CROSS/BLUE SHIELD | Source: Ambulatory Visit | Attending: Radiation Oncology | Admitting: Radiation Oncology

## 2014-12-16 ENCOUNTER — Encounter: Payer: Self-pay | Admitting: *Deleted

## 2014-12-16 VITALS — BP 137/62 | HR 134 | Temp 97.7°F | Resp 19 | Ht 68.0 in | Wt 247.6 lb

## 2014-12-16 DIAGNOSIS — R911 Solitary pulmonary nodule: Secondary | ICD-10-CM

## 2014-12-16 DIAGNOSIS — R0602 Shortness of breath: Secondary | ICD-10-CM | POA: Diagnosis not present

## 2014-12-16 DIAGNOSIS — C3411 Malignant neoplasm of upper lobe, right bronchus or lung: Secondary | ICD-10-CM

## 2014-12-16 DIAGNOSIS — R29818 Other symptoms and signs involving the nervous system: Secondary | ICD-10-CM | POA: Diagnosis present

## 2014-12-16 DIAGNOSIS — C3491 Malignant neoplasm of unspecified part of right bronchus or lung: Secondary | ICD-10-CM

## 2014-12-16 DIAGNOSIS — R2689 Other abnormalities of gait and mobility: Secondary | ICD-10-CM

## 2014-12-16 DIAGNOSIS — C781 Secondary malignant neoplasm of mediastinum: Secondary | ICD-10-CM

## 2014-12-16 HISTORY — DX: Malignant neoplasm of upper lobe, right bronchus or lung: C34.11

## 2014-12-16 NOTE — Progress Notes (Signed)
[place] Telephone:([phone]) [phone]   [phone] Multidisciplinary thoracic oncology clinic  CONSULT NOTE  REFERRING PHYSICIAN: Dr. Baltazar Apo  REASON FOR CONSULTATION:  69 years old white male recently diagnosed with lung cancer.  HPI Alejandro [name] is a 69 y.o. male was past medical history significant for multiple medical problems including history of COPD, coronary artery disease status post CABG, atrial fibrillation, dyslipidemia, anxiety, liver cirrhosis, history of alcohol and tobacco abuse as well as history of subdural hemorrhage in May 2015 followed by evacuation by Neurosurgery. The patient was admitted to [facility] several times for his brain surgery and complications. Chest x-ray on 09/07/2014 showed 1.8 x 2.3 cm nodular density in the right midlung concerning for potential neoplasm. This was followed by CT scan of the chest without contrast on 09/16/2014 and it showed an irregular nodular appearing lesion in the lateral segment of the right middle lobe measuring 3.0 x 2.6 cm. Immediately superior to this lesion, there is a 5 x 5 mm nodular lesion in this area. There are scattered small mediastinal lymph nodes. There is a lymph node just anterior to the distal trachea on the right measuring 2.1x 1.8 cm, mildly enlarged. A PET scan was performed on 10/23/2014 and it showed hypermetabolic 3.2 x 2.1 cm macrolobulated and spiculated right upper lobe mass with subcarinal and right paratracheal lymphadenopathy, suspicious for primary bronchogenic carcinoma compatible with T2a, N2, MX disease. On 11/09/2014 the patient underwent a video bronchoscopy with endobronchial ultrasound under the care of Dr. [name].  The final pathology of the right upper lobe nodule (Accession: [id number]) showed invasive squamous cell carcinoma. Dr. [name] kindly referred the patient to me today for evaluation and recommendation regarding treatment of his condition. When seen today the  patient is feeling fine except for shortness of breath and cough productive of whitish sputum. He denied having any significant chest pain or hemoptysis. He has some hoarseness of his voice since the intubation for his brain surgery. He denied having any significant weight loss or night sweats. He denied having any nausea or vomiting, no fever or chills. He denied having any current headache or visual changes. Family history significant for mother and father with heart disease and diabetes mellitus. Brother had throat cancer. The patient is married and has 2 daughters. He was accompanied today by his wife Alejandro [name] and his daughter Alejandro [name]. He used to work as Customer service manager. The patient has a history of smoking 1 pack per day for around 52 years but quit on 07/20/2014. He also has a history of alcohol abuse and quit recently. He has no history of drug abuse.  HPI  Past Medical History  Diagnosis Date  . Systolic heart failure   . 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 Dyslipidemia   . HTN (hypertension)   . Atrial fibrillation   . Tobacco abuse   . Alcohol abuse   . H/O hiatal hernia   . Myocardial infarction   . Dysrhythmia     atrial fib  . Stroke 5/15  . CHF (congestive heart failure)   . Shortness of breath     occ  . GERD (gastroesophageal reflux disease)   . Cancer     around nose.  New dx of lung cancer    Past Surgical History  Procedure Laterality Date  . Umbilical hernia repair  2010  . Hernia repair  2012  . Ventral hernia repair N/A 01/22/2013  Procedure: HERNIA REPAIR VENTRAL ADULT;  Surgeon: [name], MD;  Location: [place];  Service: General;  Laterality: N/A;  . Application of a-cell of chest/abdomen N/A 01/22/2013    Procedure: APPLICATION OF A-CELL OF CHEST/ABDOMEN;  Surgeon: [name], MD;  Location: [place];  Service: General;  Laterality: N/A;  . Cystoscopy N/A 01/22/2013    Procedure: Consuela Mimes;  Surgeon: [name], MD;  Location: [place];  Service: Urology;  Laterality: N/A;  Cystoscopy with balloon dilation. Insertion of coude catheter.  . Craniotomy Right 04/19/2014    Procedure: CRANIOTOMY HEMATOMA EVACUATION SUBDURAL;  Surgeon: [name], MD;  Location: [place] [facility];  Service: Neurosurgery;  Laterality: Right;  right  . Craniotomy Right 04/23/2014    Procedure: Craniotomy for Intracerebral Hemorrhage;  Surgeon: [name], MD;  Location: [place] [facility];  Service: Neurosurgery;  Laterality: Right;  . 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 Coronary artery bypass graft  12/24/2008    4 vessel  . Coronary artery bypass graft  2012?  Marland Kitchen Craniotomy N/A 09/13/2014    Procedure: CRANIOTOMY BONE FLAP/PROSTHETIC PLATE;  Surgeon: [name], MD;  Location: [place] [facility];  Service: Neurosurgery;  Laterality: N/A;  . Video bronchoscopy with endobronchial ultrasound N/A 11/09/2014    Procedure: VIDEO BRONCHOSCOPY WITH ENDOBRONCHIAL ULTRASOUND;  Surgeon: Collene Gobble, MD;  Location: [facility];  Service: Thoracic;  Laterality: N/A;    Family History  Problem Relation Age of Onset  . Diabetes Mother   . Heart disease Mother   . Diabetes Father   . Heart disease Father   . Heart disease Brother     s/p CABG at 28  . Colon cancer Neg Hx   . Prostate cancer Neg Hx   . Rheum arthritis Daughter     Social History History  Substance Use Topics  . Smoking status: Former Smoker -- 1.00 packs/day for 50 years    Types: Cigarettes    Quit date: 04/19/2013  . Smokeless tobacco: Never Used  . Alcohol Use: No     Comment: quit    No Known Allergies  Current Outpatient Prescriptions  Medication Sig Dispense Refill  . aspirin 81 MG tablet Take 81 mg by mouth daily.    . carvedilol (COREG) 12.5 MG tablet Take 12.5 mg by mouth 2 (two) times daily with a  meal.     . FLUoxetine (PROZAC) 20 MG tablet Take 1 tablet (20 mg total) by mouth daily. 30 tablet 6  . furosemide (LASIX) 40 MG tablet Take one tablet by mouth one time daily 30 tablet 5  . levETIRAcetam (KEPPRA) 500 MG tablet Take 1 tablet (500 mg total) by mouth 2 (two) times daily. 60 tablet 6  . lisinopril (ZESTRIL) 2.5 MG tablet Take 1 tablet (2.5 mg total) by mouth daily. 30 tablet 6  . Multiple Vitamin (MULTIVITAMIN WITH MINERALS) TABS tablet Take 1 tablet by mouth daily.    . pantoprazole (PROTONIX) 40 MG tablet Take 1 tablet (40 mg total) by mouth at bedtime. 30 tablet 6  . potassium chloride (K-DUR,KLOR-CON) 10 MEQ tablet TAKE ONE TABLET BY MOUTH ONE TIME DAILY  30 tablet 6  . simvastatin (ZOCOR) 40 MG tablet Take 1 tablet (40 mg total) by mouth every evening. 30 tablet 4  . folic acid (  FOLVITE) 1 MG tablet Take 1 tablet (1 mg total) by mouth daily. (Patient not taking: Reported on 12/16/2014) 30 tablet 3   No current facility-administered medications for this visit.    Review of Systems  Constitutional: positive for fatigue Eyes: negative Ears, nose, mouth, throat, and face: negative Respiratory: positive for cough and dyspnea on exertion Cardiovascular: negative Gastrointestinal: negative Genitourinary:negative Integument/breast: negative Hematologic/lymphatic: negative Musculoskeletal:negative Neurological: negative Behavioral/Psych: negative Endocrine: negative Allergic/Immunologic: negative  Physical Exam  VOZ:DGUYQ, healthy, no distress, well nourished and well developed SKIN: skin color, texture, turgor are normal, no rashes or significant lesions HEAD: Surgical scar from recent surgery in the right temporal area. EYES: normal, PERRLA EARS: External ears normal, Canals clear OROPHARYNX:no exudate, no erythema and lips, buccal mucosa, and tongue normal  NECK: supple, no adenopathy, no JVD LYMPH:  no palpable lymphadenopathy, no hepatosplenomegaly LUNGS: clear  to auscultation , and palpation HEART: regular rate & rhythm, no murmurs and no gallops ABDOMEN:abdomen soft, non-tender, obese, normal bowel sounds and no masses or organomegaly BACK: Back symmetric, no curvature., No CVA tenderness EXTREMITIES:no joint deformities, effusion, or inflammation, no edema, no skin discoloration  NEURO: alert & oriented x 3 with fluent speech, no focal motor/sensory deficits  PERFORMANCE STATUS: ECOG 1  LABORATORY DATA: Lab Results  Component Value Date   WBC 7.4 11/29/2014   HGB 11.7* 11/29/2014   HCT 34.6* 11/29/2014   MCV 81.0 11/29/2014   PLT 186 11/29/2014      Chemistry      Component Value Date/Time   NA 139 11/09/2014 0658   K 4.5 11/09/2014 0658   CL 100 11/09/2014 0658   CO2 25 11/09/2014 0658   BUN 21 11/09/2014 0658   CREATININE 1.32 11/09/2014 0658   CREATININE 0.99 09/26/2012 1526      Component Value Date/Time   CALCIUM 9.6 11/09/2014 0658   ALKPHOS 68 09/23/2014 0922   AST 24 09/23/2014 0922   ALT 18 09/23/2014 0922   BILITOT 0.4 09/23/2014 0922       RADIOGRAPHIC STUDIES: Dg Chest 1 View  11/29/2014   CLINICAL DATA:  Evaluate for pneumothorax status post right lung biopsy.  EXAM: CHEST - 1 VIEW  COMPARISON:  CT 11/29/2014; 11/09/2014  FINDINGS: Stable cardiac and mediastinal contours status post median sternotomy. Elevation of the right hemidiaphragm. Right upper lobe pulmonary mass is present. No definite large pneumothorax. No pleural effusion. Degenerative changes involving the thoracic spine.  IMPRESSION: No large pneumothorax identified status post right lung mass biopsy.   Electronically Signed   By: Lovey Newcomer M.D.   On: 11/29/2014 15:39   Ct Biopsy  11/29/2014   CLINICAL DATA:  69 year old male with risk factors for lung carcinoma. He has a right upper lobe nodule which is FDG positive. He has been referred for percutaneous biopsy.  EXAM: CT GUIDED CORE BIOPSY OF RIGHT UPPER LUNG NODULE  ANESTHESIA/SEDATION: 2.0   Mg IV Versed; 100 mcg IV Fentanyl  Total Moderate Sedation Time: 14 minutes.  PROCEDURE: The procedure risks, benefits, and alternatives were explained to the patient. Questions regarding the procedure were encouraged and answered. The patient understands and consents to the procedure.  The patient position in supine position on CT gantry table and a scout CT of the chest with performed for planning purposes.  The right upper chest was prepped with Betadinein a sterile fashion, and a sterile drape was applied covering the operative field. A sterile gown and sterile gloves were used for the procedure. Local anesthesia  was provided with 1% Lidocaine.  Once the patient is prepped and draped sterilely and the region on the chest was generously infiltrated with 1% lidocaine local anesthesia, a small stab incision was made with 11 blade scalpel. CT guidance was used to navigate a 17 gauge coaxial needle system into a right upper lobe nodule corresponding to that which was FDG positive on recent PET.  Once we confirmed position of the needle tip within the nodule, the stylet was removed and 2 separate 18 gauge core biopsies were acquired. These are placed in formalin.  The needle was then removed. A final CT was performed of the entire chest.  The patient tolerated the procedure well and remained hemodynamically stable throughout.  No complications were encountered and no significant blood loss was encountered.  COMPLICATIONS: None  FINDINGS: Initial CT image of the chest demonstrates right upper lobe nodule corresponding to tissue which was recently FDG positive.  Images during the case demonstrate needle placement into this lesion.  Completion CT demonstrates expected hemorrhage around the biopsy site with no evidence of pneumothorax.  IMPRESSION: Status post CT-guided biopsy of right upper lobe nodule. Tissue specimen sent to pathology for complete histopathologic analysis.  Signed,  Dulcy Fanny. Earleen Newport DO  [facility]  [facility]  PLAN: The patient will be monitored NPO for approximately 3 hr. A chest x-ray will be performed 2 hr to observe for a delayed pneumothorax.   Electronically Signed   By: Corrie Mckusick D.O.   On: 11/29/2014 14:47    ASSESSMENT: This is a very pleasant 69 years old white male recently diagnosed with a stage IIIa (T2a, N2, M0) non-small cell lung cancer, squamous cell carcinoma diagnosed in December 2015 presented with right upper lobe lung mass in addition to mediastinal lymphadenopathy.   PLAN: I had a lengthy discussion with the patient and his family today about his current disease is stage, prognosis and treatment options. I discussed the scan results and showed the images to the patient and his family. I recommended for him a course of concurrent chemoradiation with weekly carboplatin for AUC of 2 and paclitaxel 45 MG/M2 concurrent with radiation for 6-7 weeks. I discussed with the patient adverse effect of the chemotherapy including but not limited to alopecia, myelosuppression, nausea and vomiting, peripheral neuropathy, liver or renal dysfunction. I will arrange for the patient to have a chemotherapy education class before starting the first cycle of his treatment. The patient will be seen later today by Dr. Tammi Klippel for evaluation and discussion of the radiotherapy option. He is expected to start the first cycle of the chemotherapy on 12/23/2014. I will call his pharmacy was prescription for Compazine 10 mg by mouth every 6 hours as needed for nausea. The patient would come back for follow-up visit in 3 weeks for reevaluation and management of any adverse effect of his treatment. He was seen during the multidisciplinary thoracic oncology clinic today by medical oncology, radiation oncology, thoracic navigator, physical therapist as well as oncology pharmacist. He was advised to call immediately if he has any concerning symptoms in the  interval.  The patient voices understanding of current disease status and treatment options and is in agreement with the current care plan.  All questions were answered. The patient knows to call the clinic with any problems, questions or concerns. We can certainly see the patient much sooner if necessary.  Thank you so much for allowing me to participate in the care of Shyne B  Dumler. I will continue to follow up the patient with you and assist in his care.  I spent 55 minutes counseling the patient face to face. The total time spent in the appointment was 80 minutes.  Disclaimer: This note was dictated with voice recognition software. Similar sounding words can inadvertently be transcribed and may not be corrected upon review.   Trini Christiansen K. 12/16/2014, 4:38 PM

## 2014-12-16 NOTE — [facility] Oncology Navigator Note (Unsigned)
   Thoracic Treatment Summary Name:Alejandro [name] Date:12/16/2014 DOB:May 02, 1946 Your Medical Team Medical Oncologist:Dr. Julien Nordmann Radiation Oncologist:Dr. Tammi Klippel Pulmonologist: Dr. [name]  Type and Stage of Lung Cancer Non-Small Cell Carcinoma: Squamous Cell  Clinical Stage:  Non-small cell carcinoma of lung, stage 3   Staging form: Lung, AJCC 7th Edition     Clinical stage from 12/16/2014: Stage IIIA (T1b, N2, M0) - Unsigned       Staging comments: Squamous cell carcinoma     Clinical stage is based on radiology exams.  Pathological stage will be determined after surgery.  Staging is based on the size of the tumor, involvement of lymph nodes or not, and whether or not the cancer center has spread. Recommendations Recommendations: Concurrent chemo radiation therapy  These recommendations are based on information available as of today's consult.  This is subject to change depending further testing or exams. Next Steps Next Step: Medical Oncology will set up follow up appointments  Radiation Oncology will set up follow up appointments 12/17/14 2:30 SIM Barriers to Care What do you perceive as a potential barrier that may prevent you from receiving your treatment plan? Education information given and explained  Support information given and explained Financial will have financial advocates contact patient  Resources Given: [place] on [employer]or, minerals at [email or web address] [phone] What to expect at [facility] information   Questions Norton Blizzard, RN BSN Thoracic Oncology Nurse Navigator at [place] is a nurse navigator that is available to assist you through your cancer journey.  She can answer your questions and/or provide resources regarding your treatment plan, emotional support, or financial concerns.

## 2014-12-16 NOTE — Therapy (Signed)
[place], [place], [address] Phone: [phone]   Fax:  [phone]  Physical Therapy Evaluation  Patient Details  Name: Alejandro [name] MRN: [id number] Date of Birth: 1946-11-05 Referring Provider:  Curt Bears, MD  Encounter Date: 12/16/2014      PT End of Session - 12/16/14 1653    Visit Number 1   Number of Visits 1   Date for PT Re-Evaluation 02/13/15   PT Start Time 1600   PT Stop Time 6269   PT Time Calculation (min) 15 min      Past Medical History  Diagnosis Date  . Systolic heart failure   . 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 Dyslipidemia   . HTN (hypertension)   . Atrial fibrillation   . Tobacco abuse   . Alcohol abuse   . H/O hiatal hernia   . Myocardial infarction   . Dysrhythmia     atrial fib  . Stroke 5/15  . CHF (congestive heart failure)   . Shortness of breath     occ  . GERD (gastroesophageal reflux disease)   . Cancer     around nose.  New dx of lung cancer    Past Surgical History  Procedure Laterality Date  . Umbilical hernia repair  2010  . Hernia repair  2012  . Ventral hernia repair N/A 01/22/2013    Procedure: HERNIA REPAIR VENTRAL ADULT;  Surgeon: [name], MD;  Location: [place];  Service: General;  Laterality: N/A;  . Application of a-cell of chest/abdomen N/A 01/22/2013    Procedure: APPLICATION OF A-CELL OF CHEST/ABDOMEN;  Surgeon: [name], MD;  Location: [place];  Service: General;  Laterality: N/A;  . Cystoscopy N/A 01/22/2013    Procedure: Consuela Mimes;  Surgeon: [name], MD;  Location: [place];  Service: Urology;  Laterality: N/A;  Cystoscopy with balloon dilation. Insertion of coude catheter.  . Craniotomy Right 04/19/2014    Procedure: CRANIOTOMY HEMATOMA EVACUATION SUBDURAL;  Surgeon: [name], MD;  Location: [place] [facility];  Service: Neurosurgery;  Laterality: Right;  right  . Craniotomy Right  04/23/2014    Procedure: Craniotomy for Intracerebral Hemorrhage;  Surgeon: [name], MD;  Location: [place] [facility];  Service: Neurosurgery;  Laterality: Right;  . 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 Coronary artery bypass graft  12/24/2008    4 vessel  . Coronary artery bypass graft  2012?  Marland Kitchen Craniotomy N/A 09/13/2014    Procedure: CRANIOTOMY BONE FLAP/PROSTHETIC PLATE;  Surgeon: [name], MD;  Location: [place] [facility];  Service: Neurosurgery;  Laterality: N/A;  . Video bronchoscopy with endobronchial ultrasound N/A 11/09/2014    Procedure: VIDEO BRONCHOSCOPY WITH ENDOBRONCHIAL ULTRASOUND;  Surgeon: Collene Gobble, MD;  Location: [place];  Service: Thoracic;  Laterality: N/A;    There were no vitals taken for this visit.  Visit Diagnosis:  Balance problems - Plan: PT plan of care cert/re-cert  Shortness of breath      Subjective Assessment - 12/16/14 1615    Symptoms Pt. fell and suffered subdural hematoma; s/p craniotomy and developed shortness of breath during hospitalization.   Pertinent History Incidental finding of right upper lobe lesion found to be squamous cell carcinoma, stage IIIA.  Expected to have chemoradiaton.  History includes CAD, heart failure, atrial fibrillation, HTN, tobacco and ETOH abuse, MI, CHF and GERD.   Currently in Pain? No/denies          [facility] PT Assessment - 12/16/14 0001    Assessment   Medical Diagnosis right upper lobe squamous cell carcinoma   Precautions   Precautions Fall;Other (comment)   Precaution Comments cancer precautions   Restrictions   Weight Bearing Restrictions No   Balance Screen   Has the patient fallen in the past 6 months Yes   How many times? once   Has the patient had a decrease in activity level because of a fear of falling?  Yes   Is the patient reluctant to leave  their home because of a fear of falling?  No   Home Environment   Living Enviornment Private residence   Living Arrangements Spouse/significant other   Type of [place] to enter   Entrance Stairs-Number of Steps [place] One level   Prior Function   Level of [place] Needs assistance with ADLs;Needs assistance with homemaking   Sensation   Light Touch Not tested  denies numbness   Posture/Postural Control   Posture/Postural Control Postural limitations   Postural Limitations Increased thoracic kyphosis   AROM   Overall AROM  Deficits   Overall AROM Comments Standing trunk flexion:  reaches 15 inches to floor; extension 25% loss; sidebend and rotation WFL, but patient appeared off balance while performing these.   Ambulation/Gait   Ambulation/Gait Yes   Ambulation/Gait Assistance 6: Modified independent (Device/Increase time)  some shortness of breath with distance; straight cane prn   Balance   Balance Assessed Yes   Dynamic Standing Balance   Dynamic Standing - Comments reaches 10 inches forward in standing with some effort                          PT Education - 12/16/14 1653    Education provided Yes   Education Details posture, breathing, walking, energy conservation   Person(s) Educated Patient;Spouse;Child(ren)   Methods Explanation;Handout   Comprehension Verbalized understanding               Lung Clinic Goals - 12/16/14 1656    Patient will be able to verbalize understanding of the benefit of exercise to decrease fatigue.   Status Achieved   Patient will be able to verbalize the importance of posture.   Status Achieved   Patient will be able to demonstrate diaphragmatic breathing for improved lung function.   Status Achieved   Patient will be able to verbalize understanding of the role of physical therapy to prevent functional decline and who to contact if physical therapy is needed.   Status Achieved              Plan - 12/16/14 1654    Clinical Impression Statement Patient with mutliple medical problems including indications that he has impaired balance may benefit from therapy for that and/or his endurance.   Pt will benefit from skilled therapeutic intervention in order to improve on the following deficits Decreased balance;Cardiopulmonary status limiting activity   Rehab Potential Fair   PT Frequency One time visit   PT Treatment/Interventions Patient/family education   PT Next Visit Plan None at this time; may benefit from balance retraining as his lung cancer treatment progresses.   Consulted and Agree with Plan of Care Patient  G-Codes - 12/16/14 1656    Functional Assessment Tool Used clinical judgement   Functional Limitation Mobility: Walking and moving around   Mobility: Walking and Moving Around Current Status [phone]) At least 40 percent but less than 60 percent impaired, limited or restricted   Mobility: Walking and Moving Around Goal Status [phone]) At least 40 percent but less than 60 percent impaired, limited or restricted   Mobility: Walking and Moving Around Discharge Status [phone]) At least 40 percent but less than 60 percent impaired, limited or restricted       Problem List Patient Active Problem List   Diagnosis Date Noted  . Non-small cell carcinoma of lung, stage 3 12/16/2014  . Nodule of right lung 11/02/2014  . COPD mixed type 11/02/2014  . Hepatic cirrhosis 11/02/2014  . Status post craniotomy 09/13/2014  . History of cranioplasty 09/13/2014  . Intracranial abscess 06/16/2014  . Intracerebral mass 06/15/2014  . Anxiety state, unspecified 06/09/2014  . Edema 05/24/2014  . Alcohol abuse 04/23/2014  . SDH (subdural hematoma) 04/19/2014  . Accelerated hypertension 04/19/2014  . Encounter for therapeutic drug monitoring 02/09/2014  . Recurrent ventral hernia 10/22/2012  . Skin lesion 09/26/2012  . Hernia 09/26/2012  . SYSTOLIC HEART  FAILURE, CHRONIC 06/15/2009  . SYSTOLIC HEART FAILURE, ACUTE ON CHRONIC 01/28/2009  . HYPERLIPIDEMIA-MIXED 01/27/2009  . ATRIAL FIBRILLATION 01/27/2009  . Coronary atherosclerosis of native coronary artery 12/23/2008    Elora Wolter 12/16/2014, 5:00 PM  [place], [place], [address] Phone: [phone]   Fax:  [phone]   Serafina Royals, [place]

## 2014-12-16 NOTE — Progress Notes (Signed)
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 Nodule of right lung 793.11 R91.1     HISTORY OF PRESENT ILLNESS::Alejandro [name] is a 69 y.o. 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 Subsequent chest CT confirmed the presence of a 32.6 cm soft tissue irregular nodular appearing mass in the lateral segment of the right middle lobe. In addition, CT showed scattered mediastinal lymph nodes. Then, PET CT on 10/27/2014 showed that the 3.2 cm macrolobulated spiculated right upper lobe mass had hypermetabolism with an SUV max of 5.7. There were mildly enlarged subcarinal lymph node measuring 1.5 cm with an SUV of 3.5 and a 1.8 cm right peritracheal lymph node with a maximum SUV of 4.2. Bronchoscopy on 11/09/2014 was nondiagnostic. CT-guided biopsy on 11/29/2014 demonstrated squamous cell carcinoma. The patient has, been referred today to the multidisciplinary thoracic oncology clinic for further evaluation.  PREVIOUS RADIATION THERAPY: No  PAST MEDICAL HISTORY:  has a past medical history of Systolic heart failure; CAD (coronary artery disease); Dyslipidemia; HTN (hypertension); Atrial fibrillation; Tobacco abuse; Alcohol abuse; H/O hiatal hernia; Myocardial infarction; Dysrhythmia; Stroke (5/15); CHF (congestive heart failure); Shortness of breath; GERD (gastroesophageal reflux disease); and Cancer.    PAST SURGICAL HISTORY: Past Surgical History  Procedure Laterality Date  .  Umbilical hernia repair  2010  . Hernia repair  2012  . Ventral hernia repair N/A 01/22/2013    Procedure: HERNIA REPAIR VENTRAL ADULT;  Surgeon: [name], MD;  Location: [place];  Service: General;  Laterality: N/A;  . Application of a-cell of chest/abdomen N/A 01/22/2013    Procedure: APPLICATION OF A-CELL OF CHEST/ABDOMEN;  Surgeon: [name], MD;  Location: [place];  Service: General;  Laterality: N/A;  . Cystoscopy N/A 01/22/2013    Procedure: Consuela Mimes;  Surgeon: [name], MD;  Location: [place];  Service: Urology;  Laterality: N/A;  Cystoscopy with balloon dilation. Insertion of coude catheter.  . Craniotomy Right 04/19/2014    Procedure: CRANIOTOMY HEMATOMA EVACUATION SUBDURAL;  Surgeon: [name], MD;  Location: [place] [facility];  Service: Neurosurgery;  Laterality: Right;  right  . Craniotomy Right 04/23/2014    Procedure: Craniotomy for Intracerebral Hemorrhage;  Surgeon: [name], MD;  Location: [place] [facility];  Service: Neurosurgery;  Laterality: Right;  . Craniotomy N/A 06/16/2014    Procedure: Craniotomy for intracerebral abscess and subdural empyema;  Surgeon: [name], MD;  Location: [place] [facility];  Service: Neurosurgery;  Laterality: N/A;  . Coronary artery bypass graft  12/24/2008    4 vessel  . Coronary artery bypass graft  2012?  Marland Kitchen Craniotomy N/A 09/13/2014    Procedure: CRANIOTOMY BONE FLAP/PROSTHETIC PLATE;  Surgeon: [name], MD;  Location: [place] [facility];  Service: Neurosurgery;  Laterality: N/A;  . Video bronchoscopy with endobronchial ultrasound N/A 11/09/2014    Procedure: VIDEO BRONCHOSCOPY WITH ENDOBRONCHIAL ULTRASOUND;  Surgeon: Collene Gobble, MD;  Location: [place];  Service: Thoracic;  Laterality: N/A;  FAMILY HISTORY: family history includes Diabetes in his father and mother; Heart disease in his brother, father, and mother; Rheum arthritis in his daughter. There is no history of Colon cancer or Prostate cancer.  SOCIAL HISTORY:  reports that he  quit smoking about 19 months ago. His smoking use included Cigarettes. He has a 50 pack-year smoking history. He has never used smokeless tobacco. He reports that he does not drink alcohol or use illicit drugs.  ALLERGIES: Review of patient's allergies indicates no known allergies.  MEDICATIONS:  Current Outpatient Prescriptions  Medication Sig Dispense Refill  . aspirin 81 MG tablet Take 81 mg by mouth daily.    . carvedilol (COREG) 12.5 MG tablet Take 12.5 mg by mouth 2 (two) times daily with a meal.     . FLUoxetine (PROZAC) 20 MG tablet Take 1 tablet (20 mg total) by mouth daily. 30 tablet 6  . folic acid (FOLVITE) 1 MG tablet Take 1 tablet (1 mg total) by mouth daily. 30 tablet 3  . furosemide (LASIX) 40 MG tablet Take one tablet by mouth one time daily 30 tablet 5  . levETIRAcetam (KEPPRA) 500 MG tablet Take 1 tablet (500 mg total) by mouth 2 (two) times daily. 60 tablet 6  . lisinopril (ZESTRIL) 2.5 MG tablet Take 1 tablet (2.5 mg total) by mouth daily. 30 tablet 6  . Multiple Vitamin (MULTIVITAMIN WITH MINERALS) TABS tablet Take 1 tablet by mouth daily.    . pantoprazole (PROTONIX) 40 MG tablet Take 1 tablet (40 mg total) by mouth at bedtime. 30 tablet 6  . potassium chloride (K-DUR,KLOR-CON) 10 MEQ tablet TAKE ONE TABLET BY MOUTH ONE TIME DAILY  30 tablet 6  . simvastatin (ZOCOR) 40 MG tablet Take 1 tablet (40 mg total) by mouth every evening. 30 tablet 4   No current facility-administered medications for this encounter.    REVIEW OF SYSTEMS:  A 15 point review of systems is documented in the electronic medical record. This was obtained by the nursing staff. However, I reviewed this with the patient to discuss relevant findings and make appropriate changes.  A comprehensive review of systems was negative.   PHYSICAL EXAM: The patient was afebrile with normal vital signs. He was in no acute distress today. According to pulmonary medicine,Gen: Pleasant, well-nourished, in no distress,  normal affect ENT: No lesions, mouth clear, oropharynx clear, no postnasal drip Neck: No JVD, no TMG, no carotid bruits Lungs: No use of accessory muscles, clear without rales or rhonchi Cardiovascular: RRR, heart sounds normal, no murmur or gallops, no peripheral edema musculoskeletal: No deformities, no cyanosis or clubbing Neuro: alert, non focal Skin: Warm, no lesions or rashes   KPS = 60  100 - Normal; no complaints; no evidence of disease. 90   - Able to carry on normal activity; minor signs or symptoms of disease. 80   - Normal activity with effort; some signs or symptoms of disease. 28   - Cares for self; unable to carry on normal activity or to do active work. 60   - Requires occasional assistance, but is able to care for most of his personal needs. 50   - Requires considerable assistance and frequent medical care. 49   - Disabled; requires special care and assistance. 68   - Severely disabled; hospital admission is indicated although death not imminent. 67   - Very sick; hospital admission necessary; active supportive treatment necessary. 10   - Moribund; fatal processes progressing rapidly. 0     -  Dead  Karnofsky DA, Abelmann [place], Craver LS and [place] [facility] [phone]) The use of the nitrogen mustards in the palliative treatment of carcinoma: with particular reference to bronchogenic carcinoma Cancer 1 [phone]  LABORATORY DATA:  Lab Results  Component Value Date   WBC 7.4 11/29/2014   HGB 11.7* 11/29/2014   HCT 34.6* 11/29/2014   MCV 81.0 11/29/2014   PLT 186 11/29/2014   Lab Results  Component Value Date   NA 139 11/09/2014   K 4.5 11/09/2014   CL 100 11/09/2014   CO2 25 11/09/2014   Lab Results  Component Value Date   ALT 18 09/23/2014   AST 24 09/23/2014   ALKPHOS 68 09/23/2014   BILITOT 0.4 09/23/2014    PULMONARY FUNCTION TEST:  Not applicable   RADIOGRAPHY: Dg Chest 1 View  11/29/2014   CLINICAL DATA:  Evaluate for pneumothorax status post right lung  biopsy.  EXAM: CHEST - 1 VIEW  COMPARISON:  CT 11/29/2014; 11/09/2014  FINDINGS: Stable cardiac and mediastinal contours status post median sternotomy. Elevation of the right hemidiaphragm. Right upper lobe pulmonary mass is present. No definite large pneumothorax. No pleural effusion. Degenerative changes involving the thoracic spine.  IMPRESSION: No large pneumothorax identified status post right lung mass biopsy.   Electronically Signed   By: Lovey Newcomer M.D.   On: 11/29/2014 15:39   Ct Biopsy  11/29/2014   CLINICAL DATA:  69 year old male with risk factors for lung carcinoma. He has a right upper lobe nodule which is FDG positive. He has been referred for percutaneous biopsy.  EXAM: CT GUIDED CORE BIOPSY OF RIGHT UPPER LUNG NODULE  ANESTHESIA/SEDATION: 2.0  Mg IV Versed; 100 mcg IV Fentanyl  Total Moderate Sedation Time: 14 minutes.  PROCEDURE: The procedure risks, benefits, and alternatives were explained to the patient. Questions regarding the procedure were encouraged and answered. The patient understands and consents to the procedure.  The patient position in supine position on CT gantry table and a scout CT of the chest with performed for planning purposes.  The right upper chest was prepped with Betadinein a sterile fashion, and a sterile drape was applied covering the operative field. A sterile gown and sterile gloves were used for the procedure. Local anesthesia was provided with 1% Lidocaine.  Once the patient is prepped and draped sterilely and the region on the chest was generously infiltrated with 1% lidocaine local anesthesia, a small stab incision was made with 11 blade scalpel. CT guidance was used to navigate a 17 gauge coaxial needle system into a right upper lobe nodule corresponding to that which was FDG positive on recent PET.  Once we confirmed position of the needle tip within the nodule, the stylet was removed and 2 separate 18 gauge core biopsies were acquired. These are placed in  formalin.  The needle was then removed. A final CT was performed of the entire chest.  The patient tolerated the procedure well and remained hemodynamically stable throughout.  No complications were encountered and no significant blood loss was encountered.  COMPLICATIONS: None  FINDINGS: Initial CT image of the chest demonstrates right upper lobe nodule corresponding to tissue which was recently FDG positive.  Images during the case demonstrate needle placement into this lesion.  Completion CT demonstrates expected hemorrhage around the biopsy site with no evidence of pneumothorax.  IMPRESSION: Status post CT-guided biopsy of right upper lobe nodule. Tissue specimen sent to pathology for complete histopathologic analysis.  Signed,  Dulcy Fanny. Earleen Newport, DO  [facility]  [facility]  PLAN: The patient will be monitored NPO for approximately 3 hr. A chest x-ray will be performed 2 hr to observe for a delayed pneumothorax.   Electronically Signed   By: Corrie Mckusick D.O.   On: 11/29/2014 14:47   Ct Super D Chest Wo Contrast  11/16/2014   CLINICAL DATA:  Followup of pulmonary nodule.  ICD10: R 91.1  EXAM: CT CHEST WITHOUT CONTRAST  TECHNIQUE: Multidetector CT imaging of the chest was performed using thin slice collimation for electromagnetic bronchoscopy planning purposes, without intravenous contrast.  COMPARISON:  Chest radiograph of 11/09/2014. PET of 10/27/2014. Chest CT of 09/16/2014.  FINDINGS: Lungs/Pleura:  Mild centrilobular emphysema.  Spiculated mass within the inferior lateral aspect of the right upper lobe measures 3.0 x 2.1 cm on image 32 of series 4. This contacts the right minor fissure.  There is adjacent nodularity in the right middle lobe on image 33 at approximately 4 mm. This is unchanged. A separate area of apparent nodularity on image 43 is secondary to right major fissure thickening. Clear left lung.  No pleural fluid.  Heart/Mediastinum: Prior  median sternotomy. Aortic and branch vessel atherosclerosis. Mild cardiomegaly. Pulmonary artery enlargement, with the outflow tract measuring 3.3 cm.  Low right paratracheal node is enlarged at 1.7 cm, similar.  A subcarinal node measures 2.0 cm, grossly similar. Hilar regions poorly evaluated without intravenous contrast.  Upper Abdomen: Suspicion of cirrhosis. Normal imaged portions of the spleen, stomach, pancreas, gallbladder, adrenal glands. Advanced abdominal aortic and branch vessel atherosclerosis.  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  IMPRESSION: 1. Inferior right upper lobe lung mass, most consistent with primary bronchogenic carcinoma. 2. Thoracic adenopathy, corresponding to hypermetabolism at recent PET. Most consistent with nodal metastasis. No new findings. 3. Mild cirrhosis. 4. Pulmonary artery enlargement suggests pulmonary arterial hypertension.   Electronically Signed   By: Abigail Miyamoto M.D.   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 He's not ideal surgical candidate and would likely benefit from concurrent chemoradiotherapy.  PLAN:Today, I talked to the patient and family about the findings and work-up thus far.  We discussed the natural history of locally advanced non-small cell lung cancer. and general treatment, highlighting the role of radiotherapy in the management.  We discussed the available radiation techniques, and focused on the details of logistics and delivery.  We reviewed the anticipated acute and late sequelae associated with radiation in this setting.  The patient was encouraged to ask questions that I answered to the best of my ability.  I filled out a patient counseling form during our discussion including treatment diagrams.  We retained a copy for our records.  The patient would like to proceed with  radiation and will be scheduled for CT simulation tomorrow afternoon in order to proceed with concurrent chemoradiotherapy on 12/27/2014.   I spent 30 minutes minutes face to face with the patient and more than 50% of that time was spent in counseling and/or coordination of care.   ------------------------------------------------  Sheral Apley. Tammi Klippel, M.D.

## 2014-12-17 ENCOUNTER — Ambulatory Visit
Admission: RE | Admit: 2014-12-17 | Discharge: 2014-12-17 | Disposition: A | Discharge status: Home / Self Care | Payer: BLUE CROSS/BLUE SHIELD | Source: Ambulatory Visit | Attending: Radiation Oncology | Admitting: Radiation Oncology

## 2014-12-17 ENCOUNTER — Telehealth: Payer: Self-pay | Admitting: *Deleted

## 2014-12-17 VITALS — BP 121/66 | HR 105 | Temp 97.9°F | Resp 20 | Wt 249.0 lb

## 2014-12-17 DIAGNOSIS — Z87891 Personal history of nicotine dependence: Secondary | ICD-10-CM | POA: Insufficient documentation

## 2014-12-17 DIAGNOSIS — C3411 Malignant neoplasm of upper lobe, right bronchus or lung: Secondary | ICD-10-CM

## 2014-12-17 DIAGNOSIS — C3490 Malignant neoplasm of unspecified part of unspecified bronchus or lung: Secondary | ICD-10-CM | POA: Diagnosis not present

## 2014-12-17 DIAGNOSIS — C320 Malignant neoplasm of glottis: Secondary | ICD-10-CM | POA: Insufficient documentation

## 2014-12-17 NOTE — Progress Notes (Signed)
  Radiation Oncology         [phone] ________________________________  Name: Alejandro [name] MRN: [id number]  Date: 12/17/2014  DOB: 1946-10-15  SIMULATION AND TREATMENT PLANNING NOTE    ICD-9-CM ICD-10-CM   1. 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  Identity was confirmed.  All relevant records and images related to the planned course of therapy were reviewed.  The patient freely provided informed written consent to proceed with treatment after reviewing the details related to the planned course of therapy. The consent form was witnessed and verified by the simulation staff.  Then, the patient was set-up in a stable reproducible  supine position for radiation therapy.  CT images were obtained.  Surface markings were placed.  The CT images were loaded into the planning software.  Then the target and avoidance structures were contoured.  Treatment planning then occurred.  The radiation prescription was entered and confirmed.  Then, I designed and supervised the construction of a total of 6 medically necessary complex treatment devices, including 5 MLC collimators to to conformally shaped radiation around the targeted lung cancer while excluding critical structures in the chest including lungs heart and spinal cord maximally, and one BodyFix custom pillow.  I have requested : 3D Simulation  I have requested a DVH of the following structures: Spinal cord, left lung, right lung, heart, esophagus, and targeted tu  SPECIAL TREATMENT PROCEDURE:  The planned course of therapy using radiation constitutes a special treatment procedure. Special care is required in the management of this patient for the following reasons.  This treatment constitutes a Special Treatment Procedure for the  following reason: [ Concurrent chemotherapy requiring careful monitoring for increased toxicities of treatment including weekly laboratory values.. The special nature of the planned course of radiotherapy will require increased physician supervision and oversight to ensure patient's safety with optimal treatment outcomes.  PLAN:  The patient will receive 66 Gy in 33 fractions.  ________________________________  Sheral Apley Tammi Klippel, M.D.

## 2014-12-17 NOTE — Progress Notes (Signed)
mtoc patient and spouse here for vitals and medication review.Denieis pain, shortness of breath or cough.Reviewed clinic routine for today to include signing of consent and ct simulation.aware of chemo education for 12/21/14.Daughter has healthcare power of attorney and living will. Requested copy to be brought in on next appointment to be scanned in.Distress score "2".

## 2014-12-17 NOTE — Telephone Encounter (Signed)
Called patient to follow up from thoracic clinic.  I gave him follow up appt reminder.  He stated he was aware.  He stated he is on his way to cancer center for [facility] today.  I also stated he may need to change chemo education class time.  I gave him the phone number to scheduling to change.  I did remind him that he needs that class before he starts chemo.

## 2014-12-20 ENCOUNTER — Telehealth: Payer: Self-pay | Admitting: *Deleted

## 2014-12-20 NOTE — Telephone Encounter (Signed)
Per staff message and POF I have scheduled appts. Advised scheduler of appts and to move lab appts  JMW  

## 2014-12-21 ENCOUNTER — Other Ambulatory Visit: Payer: BLUE CROSS/BLUE SHIELD

## 2014-12-21 ENCOUNTER — Encounter: Payer: Self-pay | Admitting: *Deleted

## 2014-12-21 DIAGNOSIS — C3490 Malignant neoplasm of unspecified part of unspecified bronchus or lung: Secondary | ICD-10-CM | POA: Diagnosis not present

## 2014-12-22 ENCOUNTER — Other Ambulatory Visit: Payer: Self-pay | Admitting: *Deleted

## 2014-12-22 DIAGNOSIS — C3490 Malignant neoplasm of unspecified part of unspecified bronchus or lung: Secondary | ICD-10-CM

## 2014-12-27 ENCOUNTER — Other Ambulatory Visit (HOSPITAL_BASED_OUTPATIENT_CLINIC_OR_DEPARTMENT_OTHER): Payer: BLUE CROSS/BLUE SHIELD

## 2014-12-27 ENCOUNTER — Other Ambulatory Visit: Payer: Self-pay | Admitting: Internal Medicine

## 2014-12-27 ENCOUNTER — Ambulatory Visit
Admission: RE | Admit: 2014-12-27 | Discharge: 2014-12-27 | Disposition: A | Discharge status: Home / Self Care | Payer: BLUE CROSS/BLUE SHIELD | Source: Ambulatory Visit | Attending: Radiation Oncology | Admitting: Radiation Oncology

## 2014-12-27 ENCOUNTER — Ambulatory Visit (HOSPITAL_BASED_OUTPATIENT_CLINIC_OR_DEPARTMENT_OTHER): Payer: BLUE CROSS/BLUE SHIELD

## 2014-12-27 DIAGNOSIS — C3411 Malignant neoplasm of upper lobe, right bronchus or lung: Secondary | ICD-10-CM

## 2014-12-27 DIAGNOSIS — C3491 Malignant neoplasm of unspecified part of right bronchus or lung: Secondary | ICD-10-CM

## 2014-12-27 DIAGNOSIS — Z5111 Encounter for antineoplastic chemotherapy: Secondary | ICD-10-CM

## 2014-12-27 DIAGNOSIS — C3490 Malignant neoplasm of unspecified part of unspecified bronchus or lung: Secondary | ICD-10-CM | POA: Diagnosis not present

## 2014-12-27 LAB — CBC WITH DIFFERENTIAL/PLATELET
BASO%: 0.2 % (ref 0.0–2.0)
Basophils Absolute: 0 10*3/uL (ref 0.0–0.1)
EOS ABS: 0.1 10*3/uL (ref 0.0–0.5)
EOS%: 2 % (ref 0.0–7.0)
HCT: 35.1 % — ABNORMAL LOW (ref 38.4–49.9)
HGB: 11.8 g/dL — ABNORMAL LOW (ref 13.0–17.1)
LYMPH%: 20.3 % (ref 14.0–49.0)
MCH: 28.2 pg (ref 27.2–33.4)
MCHC: 33.6 g/dL (ref 32.0–36.0)
MCV: 83.8 fL (ref 79.3–98.0)
MONO#: 0.3 10*3/uL (ref 0.1–0.9)
MONO%: 5.7 % (ref 0.0–14.0)
NEUT%: 71.8 % (ref 39.0–75.0)
NEUTROS ABS: 4.3 10*3/uL (ref 1.5–6.5)
Platelets: 151 10*3/uL (ref 140–400)
RBC: 4.19 10*6/uL — ABNORMAL LOW (ref 4.20–5.82)
RDW: 14.1 % (ref 11.0–14.6)
WBC: 6 10*3/uL (ref 4.0–10.3)
lymph#: 1.2 10*3/uL (ref 0.9–3.3)

## 2014-12-27 LAB — COMPREHENSIVE METABOLIC PANEL (CC13)
ALK PHOS: 77 U/L (ref 40–150)
ALT: 24 U/L (ref 0–55)
ANION GAP: 8 meq/L (ref 3–11)
AST: 24 U/L (ref 5–34)
Albumin: 3.6 g/dL (ref 3.5–5.0)
BILIRUBIN TOTAL: 0.45 mg/dL (ref 0.20–1.20)
BUN: 17.9 mg/dL (ref 7.0–26.0)
CO2: 24 mEq/L (ref 22–29)
CREATININE: 1.3 mg/dL (ref 0.7–1.3)
Calcium: 8.7 mg/dL (ref 8.4–10.4)
Chloride: 106 mEq/L (ref 98–109)
EGFR: 58 mL/min/{1.73_m2} — AB (ref >90)
GLUCOSE: 163 mg/dL — AB (ref 70–140)
Potassium: 4.4 mEq/L (ref 3.5–5.1)
Sodium: 138 mEq/L (ref 136–145)
Total Protein: 6.6 g/dL (ref 6.4–8.3)

## 2014-12-27 MED ORDER — PROCHLORPERAZINE MALEATE 10 MG PO TABS: 10.0000 mg | ORAL_TABLET | Freq: Four times a day (QID) | ORAL | Status: DC | PRN

## 2014-12-27 MED ORDER — DIPHENHYDRAMINE HCL 50 MG/ML IJ SOLN
50.0000 mg | Freq: Once | INTRAMUSCULAR | Status: AC
  Administered 2014-12-27: 50 mg via INTRAVENOUS

## 2014-12-27 MED ORDER — DIPHENHYDRAMINE HCL 50 MG/ML IJ SOLN
INTRAMUSCULAR | Status: AC
  Filled 2014-12-27: qty 1

## 2014-12-27 MED ORDER — DEXAMETHASONE SODIUM PHOSPHATE 20 MG/5ML IJ SOLN
20.0000 mg | Freq: Once | INTRAMUSCULAR | Status: AC
  Administered 2014-12-27: 20 mg via INTRAVENOUS

## 2014-12-27 MED ORDER — ONDANSETRON 16 MG/50ML IVPB (CHCC)
16.0000 mg | Freq: Once | INTRAVENOUS | Status: AC
  Administered 2014-12-27: 16 mg via INTRAVENOUS

## 2014-12-27 MED ORDER — DEXAMETHASONE SODIUM PHOSPHATE 20 MG/5ML IJ SOLN
INTRAMUSCULAR | Status: AC
  Filled 2014-12-27: qty 5

## 2014-12-27 MED ORDER — FAMOTIDINE IN NACL 20-0.9 MG/50ML-% IV SOLN
20.0000 mg | Freq: Once | INTRAVENOUS | Status: AC
  Administered 2014-12-27: 20 mg via INTRAVENOUS

## 2014-12-27 MED ORDER — PACLITAXEL CHEMO INJECTION 300 MG/50ML
45.0000 mg/m2 | Freq: Once | INTRAVENOUS | Status: AC
  Administered 2014-12-27: 102 mg via INTRAVENOUS
  Filled 2014-12-27: qty 17

## 2014-12-27 MED ORDER — FAMOTIDINE IN NACL 20-0.9 MG/50ML-% IV SOLN
INTRAVENOUS | Status: AC
  Filled 2014-12-27: qty 50

## 2014-12-27 MED ORDER — ONDANSETRON 16 MG/50ML IVPB (CHCC)
INTRAVENOUS | Status: AC
  Filled 2014-12-27: qty 16

## 2014-12-27 MED ORDER — SODIUM CHLORIDE 0.9 % IV SOLN
Freq: Once | INTRAVENOUS | Status: AC
  Administered 2014-12-27: 09:00:00 via INTRAVENOUS

## 2014-12-27 MED ORDER — SODIUM CHLORIDE 0.9 % IV SOLN
220.4000 mg | Freq: Once | INTRAVENOUS | Status: AC
  Administered 2014-12-27: 220 mg via INTRAVENOUS
  Filled 2014-12-27: qty 22

## 2014-12-27 NOTE — Patient Instructions (Signed)
[facility] Discharge Instructions for Patients Receiving Chemotherapy  Today you received the following chemotherapy agents Taxol and Carboplatin.  To help prevent nausea and vomiting after your treatment, we encourage you to take your nausea medication.   If you develop nausea and vomiting that is not controlled by your nausea medication, call the clinic.   BELOW ARE SYMPTOMS THAT SHOULD BE REPORTED IMMEDIATELY:  *FEVER GREATER THAN 100.5 F  *CHILLS WITH OR WITHOUT FEVER  NAUSEA AND VOMITING THAT IS NOT CONTROLLED WITH YOUR NAUSEA MEDICATION  *UNUSUAL SHORTNESS OF BREATH  *UNUSUAL BRUISING OR BLEEDING  TENDERNESS IN MOUTH AND THROAT WITH OR WITHOUT PRESENCE OF ULCERS  *URINARY PROBLEMS  *BOWEL PROBLEMS  UNUSUAL RASH Items with * indicate a potential emergency and should be followed up as soon as possible.  Feel free to call the clinic you have any questions or concerns. The clinic phone number is [phone].    

## 2014-12-28 ENCOUNTER — Telehealth: Payer: Self-pay | Admitting: *Deleted

## 2014-12-28 ENCOUNTER — Ambulatory Visit
Admission: RE | Admit: 2014-12-28 | Discharge: 2014-12-28 | Disposition: A | Discharge status: Home / Self Care | Payer: BLUE CROSS/BLUE SHIELD | Source: Ambulatory Visit | Attending: Radiation Oncology | Admitting: Radiation Oncology

## 2014-12-28 DIAGNOSIS — C3490 Malignant neoplasm of unspecified part of unspecified bronchus or lung: Secondary | ICD-10-CM | POA: Diagnosis not present

## 2014-12-28 NOTE — Telephone Encounter (Signed)
[place] for chemotherapy F/U.  Patient is doing well.  Denies n/v.  Denies any new side effects or symptoms.  Bowel and bladder is functioning well.  Eating and drinking well and I instructed to drink 64 oz minimum daily or at least the day before, of and after treatment.  Denies questions at this time and encouraged to call if needed.  Reviewed how to call after hours in the case of an emergency.

## 2014-12-28 NOTE — Telephone Encounter (Signed)
-----   Message from Gabriel Earing, RN sent at 12/27/2014  1:41 PM EST ----- Regarding: chemo follow up call First time Taxol and Carboplatin. Dr Julien Nordmann.

## 2014-12-29 ENCOUNTER — Ambulatory Visit
Admission: RE | Admit: 2014-12-29 | Discharge: 2014-12-29 | Disposition: A | Discharge status: Home / Self Care | Payer: BLUE CROSS/BLUE SHIELD | Source: Ambulatory Visit | Attending: Radiation Oncology | Admitting: Radiation Oncology

## 2014-12-29 DIAGNOSIS — C3490 Malignant neoplasm of unspecified part of unspecified bronchus or lung: Secondary | ICD-10-CM | POA: Diagnosis not present

## 2014-12-30 ENCOUNTER — Ambulatory Visit
Admission: RE | Admit: 2014-12-30 | Discharge: 2014-12-30 | Disposition: A | Discharge status: Home / Self Care | Payer: BLUE CROSS/BLUE SHIELD | Source: Ambulatory Visit | Attending: Radiation Oncology | Admitting: Radiation Oncology

## 2014-12-30 DIAGNOSIS — C3490 Malignant neoplasm of unspecified part of unspecified bronchus or lung: Secondary | ICD-10-CM | POA: Diagnosis not present

## 2014-12-31 ENCOUNTER — Ambulatory Visit
Admission: RE | Admit: 2014-12-31 | Discharge: 2014-12-31 | Disposition: A | Discharge status: Home / Self Care | Payer: BLUE CROSS/BLUE SHIELD | Source: Ambulatory Visit | Attending: Radiation Oncology | Admitting: Radiation Oncology

## 2014-12-31 DIAGNOSIS — C3490 Malignant neoplasm of unspecified part of unspecified bronchus or lung: Secondary | ICD-10-CM | POA: Diagnosis not present

## 2014-12-31 DIAGNOSIS — C3491 Malignant neoplasm of unspecified part of right bronchus or lung: Secondary | ICD-10-CM | POA: Insufficient documentation

## 2014-12-31 MED ORDER — RADIAPLEXRX EX GEL
Freq: Once | CUTANEOUS | Status: AC
  Administered 2014-12-31: 16:00:00 via TOPICAL

## 2014-12-31 NOTE — Progress Notes (Signed)
  Radiation Oncology         [phone]) [phone] ________________________________  Name: Alejandro [name] MRN: [id number]  Date: 12/31/2014  DOB: 05/10/1946  Weekly Radiation Therapy Management    ICD-9-CM ICD-10-CM   1. Non-small cell carcinoma of lung, stage 3, right 162.9 C34.91 hyaluronate sodium (RADIAPLEXRX) gel    Current Dose: 10 Gy     Planned Dose:  66 Gy  Narrative . . . . . . . . The patient presents for routine under treatment assessment.                                   The patient is without complaint.                                 Set-up films were reviewed.                                 The chart was checked. Physical Findings. . .  weight is 254 lb 6.4 oz (115.395 kg). His temperature is 98.2 F (36.8 C). His blood pressure is 122/89 and his pulse is 102. His respiration is 20 and oxygen saturation is 98%. . Weight essentially stable.  No significant changes. Impression . . . . . . . The patient is tolerating radiation. Plan . . . . . . . . . . . . Continue treatment as planned.  ________________________________  Sheral Apley. Tammi Klippel, M.D.

## 2014-12-31 NOTE — Progress Notes (Signed)
Patient denies pain, loss of appetite. He reports "feeling tired" today, SOB with minimal activities at home, dry cough.  Patient education completed. Gave patient  "Radiation and You" booklet with all pertinent information marked and discussed, re: fatigue, hair loss on right side of chest, skin irritation/care, throat irritation/management, nutrition, pain. Gave pt Radiaplex with instructions for proper use. Teach back method used; pt verbalized understanding.

## 2015-01-03 ENCOUNTER — Encounter: Payer: Self-pay | Admitting: Physician Assistant

## 2015-01-03 ENCOUNTER — Ambulatory Visit (HOSPITAL_BASED_OUTPATIENT_CLINIC_OR_DEPARTMENT_OTHER): Payer: BLUE CROSS/BLUE SHIELD | Admitting: Physician Assistant

## 2015-01-03 ENCOUNTER — Other Ambulatory Visit: Payer: Self-pay | Admitting: Medical Oncology

## 2015-01-03 ENCOUNTER — Telehealth: Payer: Self-pay | Admitting: Internal Medicine

## 2015-01-03 ENCOUNTER — Other Ambulatory Visit (HOSPITAL_BASED_OUTPATIENT_CLINIC_OR_DEPARTMENT_OTHER): Payer: BLUE CROSS/BLUE SHIELD

## 2015-01-03 ENCOUNTER — Ambulatory Visit (HOSPITAL_BASED_OUTPATIENT_CLINIC_OR_DEPARTMENT_OTHER): Payer: BLUE CROSS/BLUE SHIELD

## 2015-01-03 ENCOUNTER — Ambulatory Visit
Admission: RE | Admit: 2015-01-03 | Discharge: 2015-01-03 | Disposition: A | Discharge status: Home / Self Care | Payer: BLUE CROSS/BLUE SHIELD | Source: Ambulatory Visit | Attending: Radiation Oncology | Admitting: Radiation Oncology

## 2015-01-03 VITALS — BP 113/72 | HR 74 | Temp 98.0°F | Resp 20 | Ht 68.0 in | Wt 247.9 lb

## 2015-01-03 DIAGNOSIS — I878 Other specified disorders of veins: Secondary | ICD-10-CM

## 2015-01-03 DIAGNOSIS — C3411 Malignant neoplasm of upper lobe, right bronchus or lung: Secondary | ICD-10-CM

## 2015-01-03 DIAGNOSIS — R8290 Unspecified abnormal findings in urine: Secondary | ICD-10-CM

## 2015-01-03 DIAGNOSIS — Z5111 Encounter for antineoplastic chemotherapy: Secondary | ICD-10-CM

## 2015-01-03 DIAGNOSIS — C3491 Malignant neoplasm of unspecified part of right bronchus or lung: Secondary | ICD-10-CM

## 2015-01-03 DIAGNOSIS — C3490 Malignant neoplasm of unspecified part of unspecified bronchus or lung: Secondary | ICD-10-CM | POA: Diagnosis not present

## 2015-01-03 DIAGNOSIS — R6883 Chills (without fever): Secondary | ICD-10-CM

## 2015-01-03 LAB — CBC WITH DIFFERENTIAL/PLATELET
BASO%: 0.3 % (ref 0.0–2.0)
Basophils Absolute: 0 10*3/uL (ref 0.0–0.1)
EOS%: 1.1 % (ref 0.0–7.0)
Eosinophils Absolute: 0.1 10*3/uL (ref 0.0–0.5)
HEMATOCRIT: 33.5 % — AB (ref 38.4–49.9)
HEMOGLOBIN: 11.2 g/dL — AB (ref 13.0–17.1)
LYMPH#: 0.6 10*3/uL — AB (ref 0.9–3.3)
LYMPH%: 11.5 % — ABNORMAL LOW (ref 14.0–49.0)
MCH: 28.7 pg (ref 27.2–33.4)
MCHC: 33.6 g/dL (ref 32.0–36.0)
MCV: 85.6 fL (ref 79.3–98.0)
MONO#: 0.1 10*3/uL (ref 0.1–0.9)
MONO%: 2.7 % (ref 0.0–14.0)
NEUT#: 4.7 10*3/uL (ref 1.5–6.5)
NEUT%: 84.4 % — ABNORMAL HIGH (ref 39.0–75.0)
PLATELETS: 190 10*3/uL (ref 140–400)
RBC: 3.91 10*6/uL — ABNORMAL LOW (ref 4.20–5.82)
RDW: 14.4 % (ref 11.0–14.6)
WBC: 5.6 10*3/uL (ref 4.0–10.3)

## 2015-01-03 LAB — URINALYSIS, MICROSCOPIC - CHCC
Bilirubin (Urine): NEGATIVE
Blood: NEGATIVE
Glucose: NEGATIVE mg/dL
KETONES: NEGATIVE mg/dL
Leukocyte Esterase: NEGATIVE
NITRITE: NEGATIVE
PH: 7 (ref 4.6–8.0)
Protein: NEGATIVE mg/dL
Specific Gravity, Urine: 1.01 (ref 1.003–1.035)
UROBILINOGEN UR: 2 mg/dL (ref 0.2–1)

## 2015-01-03 LAB — COMPREHENSIVE METABOLIC PANEL (CC13)
ALBUMIN: 3.5 g/dL (ref 3.5–5.0)
ALK PHOS: 93 U/L (ref 40–150)
ALT: 55 U/L (ref 0–55)
AST: 67 U/L — ABNORMAL HIGH (ref 5–34)
Anion Gap: 9 mEq/L (ref 3–11)
BUN: 21.2 mg/dL (ref 7.0–26.0)
CO2: 23 meq/L (ref 22–29)
Calcium: 8.9 mg/dL (ref 8.4–10.4)
Chloride: 104 mEq/L (ref 98–109)
Creatinine: 1.2 mg/dL (ref 0.7–1.3)
EGFR: 64 mL/min/{1.73_m2} — ABNORMAL LOW (ref >90)
Glucose: 114 mg/dl (ref 70–140)
Potassium: 4.6 mEq/L (ref 3.5–5.1)
Sodium: 136 mEq/L (ref 136–145)
TOTAL PROTEIN: 6.3 g/dL — AB (ref 6.4–8.3)
Total Bilirubin: 1.26 mg/dL — ABNORMAL HIGH (ref 0.20–1.20)

## 2015-01-03 MED ORDER — SODIUM CHLORIDE 0.9 % IV SOLN
220.4000 mg | Freq: Once | INTRAVENOUS | Status: AC
  Administered 2015-01-03: 220 mg via INTRAVENOUS
  Filled 2015-01-03: qty 22

## 2015-01-03 MED ORDER — SODIUM CHLORIDE 0.9 % IV SOLN
Freq: Once | INTRAVENOUS | Status: AC
  Administered 2015-01-03: 14:00:00 via INTRAVENOUS

## 2015-01-03 MED ORDER — DEXAMETHASONE SODIUM PHOSPHATE 20 MG/5ML IJ SOLN
INTRAMUSCULAR | Status: AC
  Filled 2015-01-03: qty 5

## 2015-01-03 MED ORDER — ONDANSETRON 16 MG/50ML IVPB (CHCC)
16.0000 mg | Freq: Once | INTRAVENOUS | Status: AC
  Administered 2015-01-03: 16 mg via INTRAVENOUS

## 2015-01-03 MED ORDER — PACLITAXEL CHEMO INJECTION 300 MG/50ML
45.0000 mg/m2 | Freq: Once | INTRAVENOUS | Status: AC
  Administered 2015-01-03: 102 mg via INTRAVENOUS
  Filled 2015-01-03: qty 17

## 2015-01-03 MED ORDER — DEXAMETHASONE SODIUM PHOSPHATE 20 MG/5ML IJ SOLN
20.0000 mg | Freq: Once | INTRAMUSCULAR | Status: AC
  Administered 2015-01-03: 20 mg via INTRAVENOUS

## 2015-01-03 MED ORDER — DIPHENHYDRAMINE HCL 50 MG/ML IJ SOLN
50.0000 mg | Freq: Once | INTRAMUSCULAR | Status: AC
  Administered 2015-01-03: 50 mg via INTRAVENOUS

## 2015-01-03 MED ORDER — DIPHENHYDRAMINE HCL 50 MG/ML IJ SOLN
INTRAMUSCULAR | Status: AC
  Filled 2015-01-03: qty 1

## 2015-01-03 MED ORDER — ONDANSETRON 16 MG/50ML IVPB (CHCC)
INTRAVENOUS | Status: AC
  Filled 2015-01-03: qty 16

## 2015-01-03 MED ORDER — FAMOTIDINE IN NACL 20-0.9 MG/50ML-% IV SOLN
20.0000 mg | Freq: Once | INTRAVENOUS | Status: AC
  Administered 2015-01-03: 20 mg via INTRAVENOUS

## 2015-01-03 MED ORDER — FAMOTIDINE IN NACL 20-0.9 MG/50ML-% IV SOLN
INTRAVENOUS | Status: AC
  Filled 2015-01-03: qty 50

## 2015-01-03 NOTE — Telephone Encounter (Signed)
gv and printed appt sched adn avs for pt for Feb and March ...sed added tx.

## 2015-01-03 NOTE — Patient Instructions (Signed)
[facility] Discharge Instructions for Patients Receiving Chemotherapy  Today you received the following chemotherapy agents Taxol and Carboplatin.  To help prevent nausea and vomiting after your treatment, we encourage you to take your nausea medication.   If you develop nausea and vomiting that is not controlled by your nausea medication, call the clinic.   BELOW ARE SYMPTOMS THAT SHOULD BE REPORTED IMMEDIATELY:  *FEVER GREATER THAN 100.5 F  *CHILLS WITH OR WITHOUT FEVER  NAUSEA AND VOMITING THAT IS NOT CONTROLLED WITH YOUR NAUSEA MEDICATION  *UNUSUAL SHORTNESS OF BREATH  *UNUSUAL BRUISING OR BLEEDING  TENDERNESS IN MOUTH AND THROAT WITH OR WITHOUT PRESENCE OF ULCERS  *URINARY PROBLEMS  *BOWEL PROBLEMS  UNUSUAL RASH Items with * indicate a potential emergency and should be followed up as soon as possible.  Feel free to call the clinic you have any questions or concerns. The clinic phone number is [phone].    

## 2015-01-03 NOTE — Progress Notes (Addendum)
No images are attached to the encounter. No scans are attached to the encounter. No scans are attached to the encounter. [place], [place] [id number]  DIAGNOSIS: Non-small cell carcinoma of lung, stage 3   Staging form: Lung, AJCC 7th Edition     Clinical stage from 12/16/2014: Stage IIIA (T1b, N2, M0) - Signed by Curt Bears, MD on 12/18/2014       Staging comments: Squamous cell carcinoma  PRIOR THERAPY: none  CURRENT THERAPY: Concurrent chemoradiation with weekly carboplatin for an AUC of 2 and paclitaxel 45 g read square concurrent with radiation for 6-7 weeks.. Status post 1 week of treatment  INTERVAL HISTORY: Alejandro [name] 69 y.o. male returns for scheduled regular symptom management visit for followup of his recently diagnosed stage IIIa non-small cell lung cancer, squamous cell carcinoma. He is currently undergoing a course of concurrent chemoradiation, status post 1 week of treatment. He is scheduled for his last fraction of radiation on 02/09/2015. He tolerated his first week of treatment without difficulty. She denies any nausea, vomiting, diarrhea or constipation. He said no fever, chills cough or hemoptysis. Denies significant weight loss or night sweats. He presents to proceed with week #2 of treatment.   MEDICAL HISTORY: Past Medical History  Diagnosis Date  . Systolic heart failure   . 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 Dyslipidemia   . HTN (hypertension)   . Atrial fibrillation   . Tobacco abuse   . Alcohol abuse   . H/O hiatal hernia   . Myocardial infarction   . Dysrhythmia     atrial fib  . Stroke 5/15  . CHF (congestive heart failure)   . Shortness of breath     occ  . GERD (gastroesophageal reflux disease)   . Cancer     around nose.  New dx of lung cancer    ALLERGIES:  has No Known Allergies.  MEDICATIONS:  Current Outpatient  Prescriptions  Medication Sig Dispense Refill  . aspirin 81 MG tablet Take 81 mg by mouth daily.    . carvedilol (COREG) 12.5 MG tablet Take 12.5 mg by mouth 2 (two) times daily with a meal.     . FLUoxetine (PROZAC) 20 MG tablet Take 1 tablet (20 mg total) by mouth daily. 30 tablet 6  . folic acid (FOLVITE) 1 MG tablet Take 1 tablet (1 mg total) by mouth daily. 30 tablet 3  . furosemide (LASIX) 40 MG tablet Take one tablet by mouth one time daily 30 tablet 5  . hyaluronate sodium (RADIAPLEXRX) GEL Apply 1 application topically 2 (two) times daily.    Marland Kitchen levETIRAcetam (KEPPRA) 500 MG tablet Take 1 tablet (500 mg total) by mouth 2 (two) times daily. 60 tablet 6  . lisinopril (ZESTRIL) 2.5 MG tablet Take 1 tablet (2.5 mg total) by mouth daily. 30 tablet 6  . Multiple Vitamin (MULTIVITAMIN WITH MINERALS) TABS tablet Take 1 tablet by mouth daily.    . pantoprazole (PROTONIX) 40 MG tablet Take 1 tablet (40 mg total) by mouth at bedtime. 30 tablet 6  . potassium chloride (K-DUR,KLOR-CON) 10 MEQ tablet TAKE ONE TABLET BY MOUTH ONE TIME DAILY  30 tablet 6  . prochlorperazine (COMPAZINE) 10 MG tablet Take 1 tablet (10 mg total) by mouth every 6 (six) hours as needed for nausea or vomiting. 30 tablet 1  .  simvastatin (ZOCOR) 40 MG tablet Take 1 tablet (40 mg total) by mouth every evening. 30 tablet 4   No current facility-administered medications for this visit.    SURGICAL HISTORY:  Past Surgical History  Procedure Laterality Date  . Umbilical hernia repair  2010  . Hernia repair  2012  . Ventral hernia repair N/A 01/22/2013    Procedure: HERNIA REPAIR VENTRAL ADULT;  Surgeon: [name], MD;  Location: [facility];  Service: General;  Laterality: N/A;  . Application of a-cell of chest/abdomen N/A 01/22/2013    Procedure: APPLICATION OF A-CELL OF CHEST/ABDOMEN;  Surgeon: [name], MD;  Location: [facility];  Service: General;  Laterality: N/A;  . Cystoscopy N/A 01/22/2013    Procedure: Consuela Mimes;   Surgeon: [name], MD;  Location: [facility];  Service: Urology;  Laterality: N/A;  Cystoscopy with balloon dilation. Insertion of coude catheter.  . Craniotomy Right 04/19/2014    Procedure: CRANIOTOMY HEMATOMA EVACUATION SUBDURAL;  Surgeon: [name], MD;  Location: [place] [facility];  Service: Neurosurgery;  Laterality: Right;  right  . Craniotomy Right 04/23/2014    Procedure: Craniotomy for Intracerebral Hemorrhage;  Surgeon: [name], MD;  Location: [facility];  Service: Neurosurgery;  Laterality: Right;  . 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 Coronary artery bypass graft  12/24/2008    4 vessel  . Coronary artery bypass graft  2012?  Marland Kitchen Craniotomy N/A 09/13/2014    Procedure: CRANIOTOMY BONE FLAP/PROSTHETIC PLATE;  Surgeon: [name], MD;  Location: [facility];  Service: Neurosurgery;  Laterality: N/A;  . Video bronchoscopy with endobronchial ultrasound N/A 11/09/2014    Procedure: VIDEO BRONCHOSCOPY WITH ENDOBRONCHIAL ULTRASOUND;  Surgeon: Collene Gobble, MD;  Location: [facility];  Service: Thoracic;  Laterality: N/A;    REVIEW OF SYSTEMS:  Review of Systems  Constitutional: Negative for fever, chills, weight loss, malaise/fatigue and diaphoresis.  HENT: Negative for congestion, ear discharge, ear pain, hearing loss, nosebleeds, sore throat and tinnitus.   Eyes: Negative for blurred vision, double vision, photophobia, pain, discharge and redness.  Respiratory: Positive for shortness of breath. Negative for cough, hemoptysis, sputum production, wheezing and stridor.   Cardiovascular: Negative for chest pain, palpitations, orthopnea, claudication, leg swelling and PND.  Gastrointestinal: Negative for heartburn, nausea, vomiting, abdominal pain, diarrhea, constipation, blood in stool and melena.  Genitourinary: Negative.    Musculoskeletal: Negative.   Skin: Negative.   Neurological: Negative for dizziness, tingling, focal weakness, seizures, weakness and headaches.  Endo/Heme/Allergies: Does not bruise/bleed easily.  Psychiatric/Behavioral: Negative for depression. The patient is not nervous/anxious and does not have insomnia.      PHYSICAL EXAMINATION: Physical Exam  Constitutional: He is oriented to person, place, and time and well-developed, well-nourished, and in no distress.  HENT:  Head: Normocephalic and atraumatic.  Mouth/Throat: Oropharynx is clear and moist.  Eyes: Pupils are equal, round, and reactive to light.  Neck: Normal range of motion. Neck supple. No JVD present. No tracheal deviation present. No thyromegaly present.  Cardiovascular: Normal rate, regular rhythm, normal heart sounds and intact distal pulses.  Exam reveals no gallop and no friction rub.   No murmur heard. Pulmonary/Chest: Effort normal and breath sounds normal. No respiratory distress. He has no wheezes. He has no rales.  Abdominal: Soft. Bowel sounds are normal. He  exhibits no distension and no mass. There is no tenderness.  Musculoskeletal: Normal range of motion. He exhibits no edema or tenderness.  Lymphadenopathy:    He has no cervical adenopathy.  Neurological: He is alert and oriented to person, place, and time. He has normal reflexes. Gait normal.  Skin: Skin is warm and dry. No rash noted.    ECOG PERFORMANCE STATUS: 1 - Symptomatic but completely ambulatory  Blood pressure 113/72, pulse 74, temperature 98 F (36.7 C), temperature source Oral, resp. rate 20, height 5\' 8"  (1.727 m), weight 247 lb 14.4 oz (112.447 kg), SpO2 100 %.  LABORATORY DATA: Lab Results  Component Value Date   WBC 5.6 01/03/2015   HGB 11.2* 01/03/2015   HCT 33.5* 01/03/2015   MCV 85.6 01/03/2015   PLT 190 01/03/2015      Chemistry      Component Value Date/Time   NA 136 01/03/2015 1150   NA 139 11/09/2014 0658   K 4.6  01/03/2015 1150   K 4.5 11/09/2014 0658   CL 100 11/09/2014 0658   CO2 23 01/03/2015 1150   CO2 25 11/09/2014 0658   BUN 21.2 01/03/2015 1150   BUN 21 11/09/2014 0658   CREATININE 1.2 01/03/2015 1150   CREATININE 1.32 11/09/2014 0658   CREATININE 0.99 09/26/2012 1526      Component Value Date/Time   CALCIUM 8.9 01/03/2015 1150   CALCIUM 9.6 11/09/2014 0658   ALKPHOS 93 01/03/2015 1150   ALKPHOS 68 09/23/2014 0922   AST 67* 01/03/2015 1150   AST 24 09/23/2014 0922   ALT 55 01/03/2015 1150   ALT 18 09/23/2014 0922   BILITOT 1.26* 01/03/2015 1150   BILITOT 0.4 09/23/2014 0922       RADIOGRAPHIC STUDIES:  No results found.   ASSESSMENT/PLAN:  No problem-specific assessment & plan notes found for this encounter.  patient is a pleasant 69 year old Caucasian male recently diagnosed with stage IIIa (T2a, N2, M0) non-small cell lung cancer, squamous cell carcinoma diagnosed December 2015. Presented with right upper lobe lung mass in addition to mediastinal lymphadenopathy. He is currently undergoing a course of concurrent chemoradiation with weekly chemotherapy in the form of carboplatin for an AUC of 2 and paclitaxel at 45 mg/m. He status post 1 week of treatment. Patient was discussed with and also seen by Dr. Julien Nordmann. He will continue with this course of concurrent chemoradiation as scheduled. He'll follow-up in 2 weeks for another symptom management visit.  Awilda Metro E, PA-C 01/03/2015  All questions were answered. The patient knows to call the clinic with any problems, questions or concerns. We can certainly see the patient much sooner if necessary.  ADDENDUM: Hematology/Oncology Attending: I had a face to face encounter with the patient. I recommended his care plan. This is a very pleasant 69 years old white male with a stage IIIa non-small cell lung cancer currently undergoing a course of concurrent chemoradiation with weekly carboplatin and paclitaxel status post 1  cycle. He tolerated the first cycle of his treatment fairly well. The patient was doing fine until an hour ago when he started feeling cold with shaking chills. His vitals were unremarkable. His recent blood work was also unremarkable. I recommended for the patient to proceed with his treatment as planned.  He would come back for follow-up visit in 2 weeks for reevaluation. He will call if he has any concerning symptoms in the interval.  Disclaimer: This note was dictated with voice recognition software. Similar sounding words can inadvertently  be transcribed and may be missed upon review. Eilleen Kempf., MD 01/04/2015

## 2015-01-04 ENCOUNTER — Telehealth: Payer: Self-pay | Admitting: Internal Medicine

## 2015-01-04 ENCOUNTER — Ambulatory Visit
Admission: RE | Admit: 2015-01-04 | Discharge: 2015-01-04 | Disposition: A | Discharge status: Home / Self Care | Payer: BLUE CROSS/BLUE SHIELD | Source: Ambulatory Visit | Attending: Radiation Oncology | Admitting: Radiation Oncology

## 2015-01-04 ENCOUNTER — Other Ambulatory Visit: Payer: Self-pay | Admitting: *Deleted

## 2015-01-04 DIAGNOSIS — C3490 Malignant neoplasm of unspecified part of unspecified bronchus or lung: Secondary | ICD-10-CM | POA: Diagnosis not present

## 2015-01-04 DIAGNOSIS — C3491 Malignant neoplasm of unspecified part of right bronchus or lung: Secondary | ICD-10-CM

## 2015-01-04 LAB — URINE CULTURE

## 2015-01-04 NOTE — Patient Instructions (Signed)
Continue with labs and chemotherapy and radiation as scheduled Follow-up in 2 weeks

## 2015-01-04 NOTE — Telephone Encounter (Signed)
gv and printed appt sched and avs for pt for May °

## 2015-01-05 ENCOUNTER — Ambulatory Visit
Admission: RE | Admit: 2015-01-05 | Discharge: 2015-01-05 | Disposition: A | Discharge status: Home / Self Care | Payer: BLUE CROSS/BLUE SHIELD | Source: Ambulatory Visit | Attending: Radiation Oncology | Admitting: Radiation Oncology

## 2015-01-05 ENCOUNTER — Other Ambulatory Visit: Payer: Self-pay | Admitting: Radiology

## 2015-01-05 DIAGNOSIS — C3490 Malignant neoplasm of unspecified part of unspecified bronchus or lung: Secondary | ICD-10-CM | POA: Diagnosis not present

## 2015-01-06 ENCOUNTER — Other Ambulatory Visit: Payer: Self-pay | Admitting: Radiology

## 2015-01-06 ENCOUNTER — Ambulatory Visit
Admission: RE | Admit: 2015-01-06 | Discharge: 2015-01-06 | Disposition: A | Discharge status: Home / Self Care | Payer: BLUE CROSS/BLUE SHIELD | Source: Ambulatory Visit | Attending: Radiation Oncology | Admitting: Radiation Oncology

## 2015-01-06 DIAGNOSIS — C3490 Malignant neoplasm of unspecified part of unspecified bronchus or lung: Secondary | ICD-10-CM | POA: Diagnosis not present

## 2015-01-07 ENCOUNTER — Ambulatory Visit
Admission: RE | Admit: 2015-01-07 | Discharge: 2015-01-07 | Disposition: A | Discharge status: Home / Self Care | Payer: BLUE CROSS/BLUE SHIELD | Source: Ambulatory Visit | Attending: Radiation Oncology | Admitting: Radiation Oncology

## 2015-01-07 ENCOUNTER — Ambulatory Visit (HOSPITAL_COMMUNITY)
Admission: RE | Admit: 2015-01-07 | Discharge: 2015-01-07 | Disposition: A | Discharge status: Home / Self Care | Payer: BLUE CROSS/BLUE SHIELD | Source: Ambulatory Visit | Attending: Internal Medicine | Admitting: Internal Medicine

## 2015-01-07 ENCOUNTER — Encounter (HOSPITAL_COMMUNITY): Payer: Self-pay

## 2015-01-07 ENCOUNTER — Other Ambulatory Visit: Payer: Self-pay

## 2015-01-07 ENCOUNTER — Other Ambulatory Visit: Payer: Self-pay | Admitting: Internal Medicine

## 2015-01-07 ENCOUNTER — Encounter: Payer: Self-pay | Admitting: Radiation Oncology

## 2015-01-07 VITALS — BP 112/56 | HR 84 | Temp 98.3°F | Resp 16 | Ht 68.0 in | Wt 247.6 lb

## 2015-01-07 DIAGNOSIS — I252 Old myocardial infarction: Secondary | ICD-10-CM | POA: Insufficient documentation

## 2015-01-07 DIAGNOSIS — I878 Other specified disorders of veins: Secondary | ICD-10-CM

## 2015-01-07 DIAGNOSIS — K219 Gastro-esophageal reflux disease without esophagitis: Secondary | ICD-10-CM | POA: Diagnosis not present

## 2015-01-07 DIAGNOSIS — I2510 Atherosclerotic heart disease of native coronary artery without angina pectoris: Secondary | ICD-10-CM | POA: Insufficient documentation

## 2015-01-07 DIAGNOSIS — Z8673 Personal history of transient ischemic attack (TIA), and cerebral infarction without residual deficits: Secondary | ICD-10-CM | POA: Diagnosis not present

## 2015-01-07 DIAGNOSIS — I5022 Chronic systolic (congestive) heart failure: Secondary | ICD-10-CM | POA: Insufficient documentation

## 2015-01-07 DIAGNOSIS — C3411 Malignant neoplasm of upper lobe, right bronchus or lung: Secondary | ICD-10-CM | POA: Insufficient documentation

## 2015-01-07 DIAGNOSIS — Z87891 Personal history of nicotine dependence: Secondary | ICD-10-CM | POA: Insufficient documentation

## 2015-01-07 DIAGNOSIS — I10 Essential (primary) hypertension: Secondary | ICD-10-CM | POA: Insufficient documentation

## 2015-01-07 DIAGNOSIS — C3490 Malignant neoplasm of unspecified part of unspecified bronchus or lung: Secondary | ICD-10-CM | POA: Diagnosis not present

## 2015-01-07 DIAGNOSIS — R590 Localized enlarged lymph nodes: Secondary | ICD-10-CM | POA: Insufficient documentation

## 2015-01-07 DIAGNOSIS — Z452 Encounter for adjustment and management of vascular access device: Secondary | ICD-10-CM | POA: Diagnosis present

## 2015-01-07 DIAGNOSIS — E669 Obesity, unspecified: Secondary | ICD-10-CM | POA: Insufficient documentation

## 2015-01-07 DIAGNOSIS — E785 Hyperlipidemia, unspecified: Secondary | ICD-10-CM | POA: Insufficient documentation

## 2015-01-07 HISTORY — DX: Personal history of antineoplastic chemotherapy: Z92.21

## 2015-01-07 LAB — CBC
HCT: 31.5 % — ABNORMAL LOW (ref 39.0–52.0)
Hemoglobin: 10.6 g/dL — ABNORMAL LOW (ref 13.0–17.0)
MCH: 28.2 pg (ref 26.0–34.0)
MCHC: 33.7 g/dL (ref 30.0–36.0)
MCV: 83.8 fL (ref 78.0–100.0)
Platelets: 141 10*3/uL — ABNORMAL LOW (ref 150–400)
RBC: 3.76 MIL/uL — ABNORMAL LOW (ref 4.22–5.81)
RDW: 14.7 % (ref 11.5–15.5)
WBC: 3.3 10*3/uL — ABNORMAL LOW (ref 4.0–10.5)

## 2015-01-07 LAB — BASIC METABOLIC PANEL
Anion gap: 7 (ref 5–15)
BUN: 31 mg/dL — AB (ref 6–23)
CO2: 24 mmol/L (ref 19–32)
Calcium: 8.7 mg/dL (ref 8.4–10.5)
Chloride: 103 mmol/L (ref 96–112)
Creatinine, Ser: 1.03 mg/dL (ref 0.50–1.35)
GFR calc Af Amer: 84 mL/min — ABNORMAL LOW (ref >90)
GFR, EST NON AFRICAN AMERICAN: 72 mL/min — AB (ref >90)
Glucose, Bld: 101 mg/dL — ABNORMAL HIGH (ref 70–99)
POTASSIUM: 4.8 mmol/L (ref 3.5–5.1)
Sodium: 134 mmol/L — ABNORMAL LOW (ref 135–145)

## 2015-01-07 LAB — APTT: APTT: 28 s (ref 24–37)

## 2015-01-07 LAB — PROTIME-INR
INR: 1.05 (ref 0.00–1.49)
PROTHROMBIN TIME: 13.8 s (ref 11.6–15.2)

## 2015-01-07 MED ORDER — MIDAZOLAM HCL 2 MG/2ML IJ SOLN
INTRAMUSCULAR | Status: AC | PRN
  Administered 2015-01-07: 0.5 mg via INTRAVENOUS
  Administered 2015-01-07: 1 mg via INTRAVENOUS
  Administered 2015-01-07: 0.5 mg via INTRAVENOUS

## 2015-01-07 MED ORDER — LIDOCAINE-EPINEPHRINE 2 %-1:100000 IJ SOLN
INTRAMUSCULAR | Status: AC
  Filled 2015-01-07: qty 1

## 2015-01-07 MED ORDER — CEFAZOLIN SODIUM-DEXTROSE 2-3 GM-% IV SOLR
2.0000 g | Freq: Once | INTRAVENOUS | Status: AC
  Administered 2015-01-07: 2 g via INTRAVENOUS

## 2015-01-07 MED ORDER — CEFAZOLIN SODIUM-DEXTROSE 2-3 GM-% IV SOLR
INTRAVENOUS | Status: AC
  Administered 2015-01-07: 2 g via INTRAVENOUS
  Filled 2015-01-07: qty 50

## 2015-01-07 MED ORDER — HEPARIN SOD (PORK) LOCK FLUSH 100 UNIT/ML IV SOLN
INTRAVENOUS | Status: AC
  Filled 2015-01-07: qty 5

## 2015-01-07 MED ORDER — FENTANYL CITRATE 0.05 MG/ML IJ SOLN
INTRAMUSCULAR | Status: AC | PRN
  Administered 2015-01-07: 25 ug via INTRAVENOUS
  Administered 2015-01-07: 50 ug via INTRAVENOUS

## 2015-01-07 MED ORDER — MIDAZOLAM HCL 2 MG/2ML IJ SOLN
INTRAMUSCULAR | Status: AC
  Filled 2015-01-07: qty 6

## 2015-01-07 MED ORDER — CARVEDILOL 12.5 MG PO TABS
12.5000 mg | ORAL_TABLET | Freq: Two times a day (BID) | ORAL | Status: AC
  Administered 2015-01-07: 12.5 mg via ORAL
  Filled 2015-01-07: qty 1

## 2015-01-07 MED ORDER — LIDOCAINE-PRILOCAINE 2.5-2.5 % EX CREA: 1.0000 "application " | TOPICAL_CREAM | CUTANEOUS | Status: DC | PRN

## 2015-01-07 MED ORDER — HEPARIN SOD (PORK) LOCK FLUSH 100 UNIT/ML IV SOLN
INTRAVENOUS | Status: AC | PRN
  Administered 2015-01-07: 500 [IU]

## 2015-01-07 MED ORDER — SODIUM CHLORIDE 0.9 % IV SOLN
Freq: Once | INTRAVENOUS | Status: AC
  Administered 2015-01-07: 08:00:00 via INTRAVENOUS

## 2015-01-07 MED ORDER — FENTANYL CITRATE 0.05 MG/ML IJ SOLN
INTRAMUSCULAR | Status: AC
  Filled 2015-01-07: qty 4

## 2015-01-07 NOTE — H&P (Signed)
Chief Complaint: "I'm here for a port a cath"  Referring Physician(s): Mohamed,Mohamed  History of Present Illness: Alejandro [name] is a 69 y.o. male with history of recently diagnosed with stage IIIa (T2a, N2, M0) non-small cell lung cancer, squamous cell carcinoma diagnosed December 2015. He presented with a right upper lobe lung mass in addition to mediastinal lymphadenopathy. He is currently undergoing a course of concurrent chemoradiation with weekly chemotherapy in the form of carboplatin for an AUC of 2 and paclitaxel at 45 mg/m. He presents today for port a cath placement for chemotherapy.  Past Medical History  Diagnosis Date  . Systolic heart failure   . 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 Dyslipidemia   . HTN (hypertension)   . Atrial fibrillation   . Tobacco abuse   . Alcohol abuse   . H/O hiatal hernia   . Myocardial infarction   . Dysrhythmia     atrial fib  . Stroke 5/15  . CHF (congestive heart failure)   . Shortness of breath     occ  . GERD (gastroesophageal reflux disease)   . Cancer     around nose.  New dx of lung cancer  . S/P chemotherapy, time since 4-12 weeks     Past Surgical History  Procedure Laterality Date  . Umbilical hernia repair  2010  . Hernia repair  2012  . Ventral hernia repair N/A 01/22/2013    Procedure: HERNIA REPAIR VENTRAL ADULT;  Surgeon: [name], MD;  Location: [facility];  Service: General;  Laterality: N/A;  . Application of a-cell of chest/abdomen N/A 01/22/2013    Procedure: APPLICATION OF A-CELL OF CHEST/ABDOMEN;  Surgeon: [name], MD;  Location: [facility];  Service: General;  Laterality: N/A;  . Cystoscopy N/A 01/22/2013    Procedure: Consuela Mimes;  Surgeon: [name], MD;  Location: [facility];  Service: Urology;  Laterality: N/A;  Cystoscopy with balloon dilation. Insertion of coude catheter.  . Craniotomy Right 04/19/2014    Procedure: CRANIOTOMY HEMATOMA EVACUATION SUBDURAL;  Surgeon:  [name], MD;  Location: [facility];  Service: Neurosurgery;  Laterality: Right;  right  . Craniotomy Right 04/23/2014    Procedure: Craniotomy for Intracerebral Hemorrhage;  Surgeon: [name], MD;  Location: [facility];  Service: Neurosurgery;  Laterality: Right;  . 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 Coronary artery bypass graft  12/24/2008    4 vessel  . Coronary artery bypass graft  2012?  Marland Kitchen Craniotomy N/A 09/13/2014    Procedure: CRANIOTOMY BONE FLAP/PROSTHETIC PLATE;  Surgeon: [name], MD;  Location: [facility];  Service: Neurosurgery;  Laterality: N/A;  . Video bronchoscopy with endobronchial ultrasound N/A 11/09/2014    Procedure: VIDEO BRONCHOSCOPY WITH ENDOBRONCHIAL ULTRASOUND;  Surgeon: Collene Gobble, MD;  Location[facility];  Service: Thoracic;  Laterality: N/A;    Allergies: Review of patient's allergies indicates no known allergies.  Medications: Prior to Admission medications   Medication Sig Start Date End Date Taking? Authorizing Provider  aspirin 81 MG tablet Take 81 mg by mouth daily.   Yes Historical Provider, MD  carvedilol (COREG) 12.5 MG tablet Take 12.5 mg by mouth 2 (two) times daily with a meal.    Yes Historical Provider, MD  FLUoxetine (PROZAC) 20 MG tablet Take 1 tablet (20 mg total) by mouth daily. 07/21/14  Yes Colon Branch, MD  furosemide (LASIX) 40 MG tablet Take one tablet by mouth one time daily 11/01/14  Yes Blane Ohara, MD  levETIRAcetam (KEPPRA) 500 MG tablet Take 1 tablet (500 mg total) by mouth 2 (two) times daily. 07/21/14  Yes Colon Branch, MD  lisinopril (ZESTRIL) 2.5 MG tablet Take 1 tablet (2.5 mg total) by mouth daily. 06/08/14  Yes Liliane Shi, PA-C  Multiple Vitamin (MULTIVITAMIN WITH MINERALS) TABS tablet Take 1 tablet by mouth daily. 05/14/14  Yes Daniel J Angiulli, PA-C    pantoprazole (PROTONIX) 40 MG tablet Take 1 tablet (40 mg total) by mouth at bedtime. 07/02/14  Yes Blane Ohara, MD  potassium chloride (K-DUR,KLOR-CON) 10 MEQ tablet TAKE ONE TABLET BY MOUTH ONE TIME DAILY  11/17/14  Yes Blane Ohara, MD  PRESCRIPTION MEDICATION Last dose 01/03/15. At the cancer center. Dr Julien Nordmann   Yes Historical Provider, MD  prochlorperazine (COMPAZINE) 10 MG tablet Take 1 tablet (10 mg total) by mouth every 6 (six) hours as needed for nausea or vomiting. 12/27/14  Yes Curt Bears, MD  simvastatin (ZOCOR) 40 MG tablet Take 1 tablet (40 mg total) by mouth every evening. 09/01/14  Yes Colon Branch, MD  folic acid (FOLVITE) 1 MG tablet Take 1 tablet (1 mg total) by mouth daily. Patient not taking: Reported on 01/06/2015 07/26/14   Colon Branch, MD  hyaluronate sodium (RADIAPLEXRX) GEL Apply 1 application topically 2 (two) times daily.    Historical Provider, MD    Family History  Problem Relation Age of Onset  . Diabetes Mother   . Heart disease Mother   . Diabetes Father   . Heart disease Father   . Heart disease Brother     s/p CABG at 71  . Colon cancer Neg Hx   . Prostate cancer Neg Hx   . Rheum arthritis Daughter     History   Social History  . Marital Status: Married    Spouse Name: N/A    Number of Children: 2  . Years of Education: N/A   Occupational History  . [employer]   .     Social History Main Topics  . Smoking status: Former Smoker -- 1.00 packs/day for 50 years    Types: Cigarettes    Quit date: 04/19/2013  . Smokeless tobacco: Never Used  . Alcohol Use: No     Comment: quit  . Drug Use: No  . Sexual Activity: None   Other Topics Concern  . None   Social History Narrative      Review of Systems   Constitutional: Positive for fatigue. Negative for fever and chills.  Respiratory: Positive for cough and shortness of breath.   Cardiovascular: Positive for leg swelling. Negative for chest pain.  Gastrointestinal:  Negative for nausea, vomiting, abdominal pain and blood in stool.  Genitourinary: Negative for dysuria and hematuria.  Musculoskeletal: Negative for back pain.  Neurological: Negative for headaches.  Hematological: Does not bruise/bleed easily.    Vital Signs: BP 132/94 mmHg  Pulse 133  Temp(Src) 97.8 F (36.6 C) (Oral)  Resp 22  SpO2 99%  Physical Exam  Constitutional: He is oriented to person, place, and time. He appears well-developed and well-nourished.  Cardiovascular:  irreg irreg, tachycardic  Pulmonary/Chest: Effort normal.  Distant BS bilat  Abdominal: Soft. Bowel sounds are normal. There is no  tenderness.  obese  Musculoskeletal: He exhibits edema.  Neurological: He is alert and oriented to person, place, and time.    Imaging: No results found.  Labs:  CBC:  Recent Labs  11/29/14 0940 12/27/14 0834 01/03/15 1150 01/07/15 0800  WBC 7.4 6.0 5.6 3.3*  HGB 11.7* 11.8* 11.2* 10.6*  HCT 34.6* 35.1* 33.5* 31.5*  PLT 186 151 190 141*    COAGS:  Recent Labs  06/16/14 0441 11/09/14 0700 11/29/14 0940 01/07/15 0800  INR 1.10 1.06 1.09 1.05  APTT  --   --  34  --     BMP:  Recent Labs  06/18/14 0245 06/20/14 1307 09/07/14 1045 09/23/14 0922 11/09/14 0658 12/27/14 0834 01/03/15 1150  NA 138 139 137 136 139 138 136  K 4.0 3.4* 5.1 4.3 4.5 4.4 4.6  CL 100 98 101 100 100  --   --   CO2 24 24 25 30 25 24 23   GLUCOSE 154* 122* 105* 90 99 163* 114  BUN 10 6 21 19 21  17.9 21.2  CALCIUM 8.8 9.2 9.5 9.6 9.6 8.7 8.9  CREATININE 0.83 0.82 1.19 1.2 1.32 1.3 1.2  GFRNONAA 88* 89* 61*  --  53*  --   --   GFRAA >90 >90 70*  --  62*  --   --     LIVER FUNCTION TESTS:  Recent Labs  05/05/14 0515 09/23/14 0922 12/27/14 0834 01/03/15 1150  BILITOT 0.6 0.4 0.45 1.26*  AST 27 24 24  67*  ALT 65* 18 24 55  ALKPHOS 67 68 77 93  PROT 5.6* 7.6 6.6 6.3*  ALBUMIN 2.3* 3.5 3.6 3.5    TUMOR MARKERS: No results for input(s): AFPTM, CEA, CA199, CHROMGRNA  in the last 8760 hours.  Assessment and Plan: Alejandro [name] is a 69 y.o. male with history of recently diagnosed with stage IIIa (T2a, N2, M0) non-small cell lung cancer, squamous cell carcinoma diagnosed December 2015. He presented with a right upper lobe lung mass in addition to mediastinal lymphadenopathy. He is currently undergoing a course of concurrent chemoradiation with weekly chemotherapy in the form of carboplatin for an AUC of 2 and paclitaxel at 45 mg/m. He presents today for port a cath placement for chemotherapy. Details/risks of procedure d/w pt/wif with their understanding and consent.    Signed: Autumn Messing 01/07/2015, 8:55 AM

## 2015-01-07 NOTE — Progress Notes (Addendum)
Alejandro [name], here in a wheelchair, has completed 10 fractions to his right chest.  He denies pain.  He had a port a cath inserted today in his right chest.  His voice continues to be hoarse.  He denies trouble swallowing.  He reports an occasional dry cough.  He continues to have shortness of breath with walking.  His oxygen sat on room air today was 99%.  He denies any skin irritation in the treatment area.  He reports a good appetite.  He is interested in meeting with the dietician so an appointment will be set up.  He last had chemotherapy on Monday.  He reports fatigue.

## 2015-01-07 NOTE — Procedures (Signed)
Successful placement of right IJ approach port-a-cath with tip at the superior caval atrial junction. The catheter is ready for immediate use. No immediate post procedural complications.

## 2015-01-07 NOTE — Discharge Instructions (Signed)
Implanted Port Insertion, Care After °Refer to this sheet in the next few weeks. These instructions provide you with information on caring for yourself after your procedure. Your health care provider may also give you more specific instructions. Your treatment has been planned according to current medical practices, but problems sometimes occur. Call your health care provider if you have any problems or questions after your procedure. °WHAT TO EXPECT AFTER THE PROCEDURE °After your procedure, it is typical to have the following:  °· Discomfort at the port insertion site. Ice packs to the area will help. °· Bruising on the skin over the port. This will subside in 3-4 days. °HOME CARE INSTRUCTIONS °· After your port is placed, you will get a manufacturer's information card. The card has information about your port. Keep this card with you at all times.   °· Know what kind of port you have. There are many types of ports available.   °· Wear a medical alert bracelet in case of an emergency. This can help alert health care workers that you have a port.   °· The port can stay in for as long as your health care provider believes it is necessary.   °· A home health care nurse may give medicines and take care of the port.   °· You or a family member can get special training and directions for giving medicine and taking care of the port at home.   °SEEK MEDICAL CARE IF:  °· Your port does not flush or you are unable to get a blood return.   °· You have a fever or chills. °SEEK IMMEDIATE MEDICAL CARE IF: °· You have new fluid or pus coming from your incision.   °· You notice a bad smell coming from your incision site.   °· You have swelling, pain, or more redness at the incision or port site.   °· You have chest pain or shortness of breath. °Document Released: 09/09/2013 Document Revised: 11/24/2013 Document Reviewed: 09/09/2013 °ExitCare® Patient Information ©2015 [employer]. This information is not intended to replace  advice given to you by your health care provider. Make sure you discuss any questions you have with your health care provider. °Implanted Port Home Guide °An implanted port is a type of central line that is placed under the skin. Central lines are used to provide IV access when treatment or nutrition needs to be given through a person's veins. Implanted ports are used for long-term IV access. An implanted port may be placed because:  °· You need IV medicine that would be irritating to the small veins in your hands or arms.   °· You need long-term IV medicines, such as antibiotics.   °· You need IV nutrition for a long period.   °· You need frequent blood draws for lab tests.   °· You need dialysis.   °Implanted ports are usually placed in the chest area, but they can also be placed in the upper arm, the abdomen, or the leg. An implanted port has two main parts:  °· Reservoir. The reservoir is round and will appear as a small, raised area under your skin. The reservoir is the part where a needle is inserted to give medicines or draw blood.   °· Catheter. The catheter is a thin, flexible tube that extends from the reservoir. The catheter is placed into a large vein. Medicine that is inserted into the reservoir goes into the catheter and then into the vein.   °HOW WILL I CARE FOR MY INCISION SITE? °Do not get the incision site wet. Bathe or   shower as directed by your health care provider.  °HOW IS MY PORT ACCESSED? °Special steps must be taken to access the port:  °· Before the port is accessed, a numbing cream can be placed on the skin. This helps numb the skin over the port site.   °· Your health care provider uses a sterile technique to access the port. °· Your health care provider must put on a mask and sterile gloves. °· The skin over your port is cleaned carefully with an antiseptic and allowed to dry. °· The port is gently pinched between sterile gloves, and a needle is inserted into the port. °· Only  "non-coring" port needles should be used to access the port. Once the port is accessed, a blood return should be checked. This helps ensure that the port is in the vein and is not clogged.   °· If your port needs to remain accessed for a constant infusion, a clear (transparent) bandage will be placed over the needle site. The bandage and needle will need to be changed every week, or as directed by your health care provider.   °· Keep the bandage covering the needle clean and dry. Do not get it wet. Follow your health care provider's instructions on how to take a shower or bath while the port is accessed.   °· If your port does not need to stay accessed, no bandage is needed over the port.   °WHAT IS FLUSHING? °Flushing helps keep the port from getting clogged. Follow your health care provider's instructions on how and when to flush the port. Ports are usually flushed with saline solution or a medicine called heparin. The need for flushing will depend on how the port is used.  °· If the port is used for intermittent medicines or blood draws, the port will need to be flushed:   °· After medicines have been given.   °· After blood has been drawn.   °· As part of routine maintenance.   °· If a constant infusion is running, the port may not need to be flushed.   °HOW LONG WILL MY PORT STAY IMPLANTED? °The port can stay in for as long as your health care provider thinks it is needed. When it is time for the port to come out, surgery will be done to remove it. The procedure is similar to the one performed when the port was put in.  °WHEN SHOULD I SEEK IMMEDIATE MEDICAL CARE? °When you have an implanted port, you should seek immediate medical care if:  °· You notice a bad smell coming from the incision site.   °· You have swelling, redness, or drainage at the incision site.   °· You have more swelling or pain at the port site or the surrounding area.   °· You have a fever that is not controlled with medicine. °Document  Released: 11/19/2005 Document Revised: 09/09/2013 Document Reviewed: 07/27/2013 °ExitCare® Patient Information ©2015 [employer]. This information is not intended to replace advice given to you by your health care provider. Make sure you discuss any questions you have with your health care provider. °Conscious Sedation °Sedation is the use of medicines to promote relaxation and relieve discomfort and anxiety. Conscious sedation is a type of sedation. Under conscious sedation you are less alert than normal but are still able to respond to instructions or stimulation. Conscious sedation is used during short medical and dental procedures. It is milder than deep sedation or general anesthesia and allows you to return to your regular activities sooner.  °LET YOUR HEALTH CARE PROVIDER   KNOW ABOUT:   Any allergies you have.  All medicines you are taking, including vitamins, herbs, eye drops, creams, and over-the-counter medicines.  Use of steroids (by mouth or creams).  Previous problems you or members of your family have had with the use of anesthetics.  Any blood disorders you have.  Previous surgeries you have had.  Medical conditions you have.  Possibility of pregnancy, if this applies.  Use of cigarettes, alcohol, or illegal drugs. RISKS AND COMPLICATIONS Generally, this is a safe procedure. However, as with any procedure, problems can occur. Possible problems include:  Oversedation.  Trouble breathing on your own. You may need to have a breathing tube until you are awake and breathing on your own.  Allergic reaction to any of the medicines used for the procedure. BEFORE THE PROCEDURE  You may have blood tests done. These tests can help show how well your kidneys and liver are working. They can also show how well your blood clots.  A physical exam will be done.  Only take medicines as directed by your health care provider. You may need to stop taking medicines (such as blood  thinners, aspirin, or nonsteroidal anti-inflammatory drugs) before the procedure.   Do not eat or drink at least 6 hours before the procedure or as directed by your health care provider.  Arrange for a responsible adult, family member, or friend to take you home after the procedure. He or she should stay with you for at least 24 hours after the procedure, until the medicine has worn off. PROCEDURE   An intravenous (IV) catheter will be inserted into one of your veins. Medicine will be able to flow directly into your body through this catheter. You may be given medicine through this tube to help prevent pain and help you relax.  The medical or dental procedure will be done. AFTER THE PROCEDURE  You will stay in a recovery area until the medicine has worn off. Your blood pressure and pulse will be checked.   Depending on the procedure you had, you may be allowed to go home when you can tolerate liquids and your pain is under control. Document Released: 08/14/2001 Document Revised: 11/24/2013 Document Reviewed: 07/27/2013 [facility] Patient Information 2015 [place], [place]. This information is not intended to replace advice given to you by your health care provider. Make sure you discuss any questions you have with your health care provider.

## 2015-01-07 NOTE — Progress Notes (Signed)
  Radiation Oncology         [phone] ________________________________  Name: Alejandro [name] MRN: [id number]  Date: 01/07/2015  DOB: [birth year]  Weekly Radiation Therapy Management    ICD-9-CM ICD-10-CM   1. Non-small cell carcinoma of lung, stage 3, unspecified laterality 162.9 C34.90     Current Dose: 20 Gy     Planned Dose:  66 Gy  Narrative . . . . . . . . The patient presents for routine under treatment assessment.                                  Donzetta Sprung, here in a wheelchair, has completed 10 fractions to his right chest.  He denies pain.  He had a port a cath inserted today in his right chest.  His voice continues to be hoarse.  He denies trouble swallowing.  He reports an occasional dry cough.  He continues to have shortness of breath with walking.  His oxygen sat on room air today was 99%.  He denies any skin irritation in the treatment area.  He reports a good appetite.  He is interested in meeting with the dietician so an appointment will be set up.  He last had chemotherapy on Monday.  He reports fatigue.                                 Set-up films were reviewed.                                 The chart was checked. Physical Findings. . .  height is 5\' 8"  (1.727 m) and weight is 247 lb 9.6 oz (112.311 kg). His oral temperature is 98.3 F (36.8 C). His blood pressure is 112/56 and his pulse is 84. His respiration is 16 and oxygen saturation is 99%. . Weight essentially stable.  No significant changes. Impression . . . . . . . The patient is tolerating radiation. Plan . . . . . . . . . . . . Continue treatment as planned.  eRx'd EMLA  ________________________________  Sheral Apley Tammi Klippel, M.D.

## 2015-01-10 ENCOUNTER — Other Ambulatory Visit (HOSPITAL_BASED_OUTPATIENT_CLINIC_OR_DEPARTMENT_OTHER): Payer: BLUE CROSS/BLUE SHIELD

## 2015-01-10 ENCOUNTER — Ambulatory Visit: Payer: BLUE CROSS/BLUE SHIELD

## 2015-01-10 ENCOUNTER — Ambulatory Visit (HOSPITAL_BASED_OUTPATIENT_CLINIC_OR_DEPARTMENT_OTHER): Payer: BLUE CROSS/BLUE SHIELD

## 2015-01-10 ENCOUNTER — Ambulatory Visit
Admission: RE | Admit: 2015-01-10 | Discharge: 2015-01-10 | Disposition: A | Discharge status: Home / Self Care | Payer: BLUE CROSS/BLUE SHIELD | Source: Ambulatory Visit | Attending: Radiation Oncology | Admitting: Radiation Oncology

## 2015-01-10 ENCOUNTER — Ambulatory Visit: Payer: Self-pay | Admitting: Emergency Medicine

## 2015-01-10 DIAGNOSIS — C3491 Malignant neoplasm of unspecified part of right bronchus or lung: Secondary | ICD-10-CM

## 2015-01-10 DIAGNOSIS — C3490 Malignant neoplasm of unspecified part of unspecified bronchus or lung: Secondary | ICD-10-CM | POA: Diagnosis not present

## 2015-01-10 DIAGNOSIS — Z5111 Encounter for antineoplastic chemotherapy: Secondary | ICD-10-CM

## 2015-01-10 DIAGNOSIS — C3411 Malignant neoplasm of upper lobe, right bronchus or lung: Secondary | ICD-10-CM

## 2015-01-10 LAB — COMPREHENSIVE METABOLIC PANEL (CC13)
ALT: 64 U/L — ABNORMAL HIGH (ref 0–55)
ANION GAP: 8 meq/L (ref 3–11)
AST: 23 U/L (ref 5–34)
Albumin: 3.1 g/dL — ABNORMAL LOW (ref 3.5–5.0)
Alkaline Phosphatase: 134 U/L (ref 40–150)
BUN: 24.3 mg/dL (ref 7.0–26.0)
CALCIUM: 8.8 mg/dL (ref 8.4–10.4)
CHLORIDE: 106 meq/L (ref 98–109)
CO2: 21 meq/L — AB (ref 22–29)
Creatinine: 1 mg/dL (ref 0.7–1.3)
EGFR: 73 mL/min/{1.73_m2} — ABNORMAL LOW (ref >90)
Glucose: 182 mg/dl — ABNORMAL HIGH (ref 70–140)
Potassium: 4.9 mEq/L (ref 3.5–5.1)
Sodium: 135 mEq/L — ABNORMAL LOW (ref 136–145)
TOTAL PROTEIN: 6.2 g/dL — AB (ref 6.4–8.3)
Total Bilirubin: 0.47 mg/dL (ref 0.20–1.20)

## 2015-01-10 LAB — CBC WITH DIFFERENTIAL/PLATELET
BASO%: 0 % (ref 0.0–2.0)
Basophils Absolute: 0 10*3/uL (ref 0.0–0.1)
EOS%: 0.9 % (ref 0.0–7.0)
Eosinophils Absolute: 0 10*3/uL (ref 0.0–0.5)
HEMATOCRIT: 31.3 % — AB (ref 38.4–49.9)
HGB: 10.7 g/dL — ABNORMAL LOW (ref 13.0–17.1)
LYMPH%: 24.3 % (ref 14.0–49.0)
MCH: 28.2 pg (ref 27.2–33.4)
MCHC: 34.2 g/dL (ref 32.0–36.0)
MCV: 82.4 fL (ref 79.3–98.0)
MONO#: 0.1 10*3/uL (ref 0.1–0.9)
MONO%: 6.1 % (ref 0.0–14.0)
NEUT%: 68.7 % (ref 39.0–75.0)
NEUTROS ABS: 1.6 10*3/uL (ref 1.5–6.5)
NRBC: 0 % (ref 0–0)
Platelets: 141 10*3/uL (ref 140–400)
RBC: 3.8 10*6/uL — AB (ref 4.20–5.82)
RDW: 14.4 % (ref 11.0–14.6)
WBC: 2.3 10*3/uL — AB (ref 4.0–10.3)
lymph#: 0.6 10*3/uL — ABNORMAL LOW (ref 0.9–3.3)

## 2015-01-10 MED ORDER — FAMOTIDINE IN NACL 20-0.9 MG/50ML-% IV SOLN
20.0000 mg | Freq: Once | INTRAVENOUS | Status: AC
  Administered 2015-01-10: 20 mg via INTRAVENOUS

## 2015-01-10 MED ORDER — ONDANSETRON 16 MG/50ML IVPB (CHCC)
16.0000 mg | Freq: Once | INTRAVENOUS | Status: AC
  Administered 2015-01-10: 16 mg via INTRAVENOUS

## 2015-01-10 MED ORDER — DEXAMETHASONE SODIUM PHOSPHATE 20 MG/5ML IJ SOLN
INTRAMUSCULAR | Status: AC
  Filled 2015-01-10: qty 5

## 2015-01-10 MED ORDER — DEXAMETHASONE SODIUM PHOSPHATE 20 MG/5ML IJ SOLN
20.0000 mg | Freq: Once | INTRAMUSCULAR | Status: AC
  Administered 2015-01-10: 20 mg via INTRAVENOUS

## 2015-01-10 MED ORDER — HEPARIN SOD (PORK) LOCK FLUSH 100 UNIT/ML IV SOLN
500.0000 [IU] | Freq: Once | INTRAVENOUS | Status: AC | PRN
Start: 2015-01-10 — End: 2015-01-10
  Administered 2015-01-10: 500 [IU]
  Filled 2015-01-10: qty 5

## 2015-01-10 MED ORDER — ONDANSETRON 16 MG/50ML IVPB (CHCC)
INTRAVENOUS | Status: AC
  Filled 2015-01-10: qty 16

## 2015-01-10 MED ORDER — SODIUM CHLORIDE 0.9 % IJ SOLN
10.0000 mL | INTRAMUSCULAR | Status: DC | PRN
  Administered 2015-01-10: 10 mL via INTRAVENOUS
  Filled 2015-01-10: qty 10

## 2015-01-10 MED ORDER — SODIUM CHLORIDE 0.9 % IV SOLN
Freq: Once | INTRAVENOUS | Status: AC
  Administered 2015-01-10: 12:00:00 via INTRAVENOUS

## 2015-01-10 MED ORDER — SODIUM CHLORIDE 0.9 % IJ SOLN
10.0000 mL | INTRAMUSCULAR | Status: DC | PRN
  Administered 2015-01-10: 10 mL
  Filled 2015-01-10: qty 10

## 2015-01-10 MED ORDER — DIPHENHYDRAMINE HCL 50 MG/ML IJ SOLN
50.0000 mg | Freq: Once | INTRAMUSCULAR | Status: AC
  Administered 2015-01-10: 50 mg via INTRAVENOUS

## 2015-01-10 MED ORDER — FAMOTIDINE IN NACL 20-0.9 MG/50ML-% IV SOLN
INTRAVENOUS | Status: AC
  Filled 2015-01-10: qty 50

## 2015-01-10 MED ORDER — PACLITAXEL CHEMO INJECTION 300 MG/50ML
45.0000 mg/m2 | Freq: Once | INTRAVENOUS | Status: AC
  Administered 2015-01-10: 102 mg via INTRAVENOUS
  Filled 2015-01-10: qty 17

## 2015-01-10 MED ORDER — DIPHENHYDRAMINE HCL 50 MG/ML IJ SOLN
INTRAMUSCULAR | Status: AC
  Filled 2015-01-10: qty 1

## 2015-01-10 MED ORDER — SODIUM CHLORIDE 0.9 % IV SOLN
220.4000 mg | Freq: Once | INTRAVENOUS | Status: AC
  Administered 2015-01-10: 220 mg via INTRAVENOUS
  Filled 2015-01-10: qty 22

## 2015-01-10 NOTE — Patient Instructions (Signed)

## 2015-01-10 NOTE — Patient Instructions (Signed)
Cancer Center Discharge Instructions for Patients Receiving Chemotherapy  Today you received the following chemotherapy agents Taxol and Carboplatin.  To help prevent nausea and vomiting after your treatment, we encourage you to take your nausea medication.   If you develop nausea and vomiting that is not controlled by your nausea medication, call the clinic.   BELOW ARE SYMPTOMS THAT SHOULD BE REPORTED IMMEDIATELY:  *FEVER GREATER THAN 100.5 F  *CHILLS WITH OR WITHOUT FEVER  NAUSEA AND VOMITING THAT IS NOT CONTROLLED WITH YOUR NAUSEA MEDICATION  *UNUSUAL SHORTNESS OF BREATH  *UNUSUAL BRUISING OR BLEEDING  TENDERNESS IN MOUTH AND THROAT WITH OR WITHOUT PRESENCE OF ULCERS  *URINARY PROBLEMS  *BOWEL PROBLEMS  UNUSUAL RASH Items with * indicate a potential emergency and should be followed up as soon as possible.  Feel free to call the clinic you have any questions or concerns. The clinic phone number is [phone].    

## 2015-01-11 ENCOUNTER — Ambulatory Visit
Admission: RE | Admit: 2015-01-11 | Discharge: 2015-01-11 | Disposition: A | Discharge status: Home / Self Care | Payer: BLUE CROSS/BLUE SHIELD | Source: Ambulatory Visit | Attending: Radiation Oncology | Admitting: Radiation Oncology

## 2015-01-11 DIAGNOSIS — C3490 Malignant neoplasm of unspecified part of unspecified bronchus or lung: Secondary | ICD-10-CM | POA: Diagnosis not present

## 2015-01-12 ENCOUNTER — Ambulatory Visit
Admission: RE | Admit: 2015-01-12 | Discharge: 2015-01-12 | Disposition: A | Discharge status: Home / Self Care | Payer: BLUE CROSS/BLUE SHIELD | Source: Ambulatory Visit | Attending: Radiation Oncology | Admitting: Radiation Oncology

## 2015-01-12 DIAGNOSIS — C3490 Malignant neoplasm of unspecified part of unspecified bronchus or lung: Secondary | ICD-10-CM | POA: Diagnosis not present

## 2015-01-13 ENCOUNTER — Ambulatory Visit
Admission: RE | Admit: 2015-01-13 | Discharge: 2015-01-13 | Disposition: A | Discharge status: Home / Self Care | Payer: BLUE CROSS/BLUE SHIELD | Source: Ambulatory Visit | Attending: Radiation Oncology | Admitting: Radiation Oncology

## 2015-01-13 DIAGNOSIS — C3490 Malignant neoplasm of unspecified part of unspecified bronchus or lung: Secondary | ICD-10-CM | POA: Diagnosis not present

## 2015-01-14 ENCOUNTER — Ambulatory Visit
Admission: RE | Admit: 2015-01-14 | Discharge: 2015-01-14 | Disposition: A | Discharge status: Home / Self Care | Payer: BLUE CROSS/BLUE SHIELD | Source: Ambulatory Visit | Attending: Radiation Oncology | Admitting: Radiation Oncology

## 2015-01-14 ENCOUNTER — Other Ambulatory Visit: Payer: Self-pay | Admitting: *Deleted

## 2015-01-14 ENCOUNTER — Encounter: Payer: Self-pay | Admitting: Radiation Oncology

## 2015-01-14 ENCOUNTER — Other Ambulatory Visit: Payer: Self-pay | Admitting: Physician Assistant

## 2015-01-14 VITALS — BP 124/85 | Resp 18 | Wt 249.6 lb

## 2015-01-14 DIAGNOSIS — C3490 Malignant neoplasm of unspecified part of unspecified bronchus or lung: Secondary | ICD-10-CM | POA: Diagnosis not present

## 2015-01-14 NOTE — Progress Notes (Signed)
  Radiation Oncology         [phone] ________________________________  Name: Alejandro [name] MRN: [id number]  Date: 01/14/2015  DOB: 1945-12-29  Weekly Radiation Therapy Management    ICD-9-CM ICD-10-CM   1. Non-small cell carcinoma of lung, stage 3, unspecified laterality 162.9 C34.90     Current Dose: 30 Gy     Planned Dose:  66 Gy  Narrative . . . . . . . . The patient presents for routine under treatment assessment.                                  No complaints except hoarseness                                 Set-up films were reviewed.                                 The chart was checked. Physical Findings. . .  weight is 249 lb 9.6 oz (113.218 kg). His blood pressure is 124/85. His respiration is 18. . Weight essentially stable.  No significant changes. Impression . . . . . . . The patient is tolerating radiation. Plan . . . . . . . . . . . . Continue treatment as planned.    ________________________________  Sheral Apley. Tammi Klippel, M.D.

## 2015-01-14 NOTE — Progress Notes (Signed)
Weight and vitals stable. Ambulates with a cane. Hoarseness noted. Denies difficulty swallowing. SOB with exertion. Reports a non productive cough. Reports dry itching skin on his chest but, no hyperpigmentation noted. Reports using radiaplex as directed.

## 2015-01-17 ENCOUNTER — Ambulatory Visit
Admission: RE | Admit: 2015-01-17 | Discharge: 2015-01-17 | Disposition: A | Discharge status: Home / Self Care | Payer: BLUE CROSS/BLUE SHIELD | Source: Ambulatory Visit | Attending: Radiation Oncology | Admitting: Radiation Oncology

## 2015-01-17 ENCOUNTER — Telehealth: Payer: Self-pay | Admitting: *Deleted

## 2015-01-17 ENCOUNTER — Other Ambulatory Visit: Payer: Self-pay | Admitting: Nurse Practitioner

## 2015-01-17 ENCOUNTER — Other Ambulatory Visit (HOSPITAL_BASED_OUTPATIENT_CLINIC_OR_DEPARTMENT_OTHER): Payer: BLUE CROSS/BLUE SHIELD

## 2015-01-17 ENCOUNTER — Ambulatory Visit (HOSPITAL_BASED_OUTPATIENT_CLINIC_OR_DEPARTMENT_OTHER): Payer: BLUE CROSS/BLUE SHIELD

## 2015-01-17 ENCOUNTER — Ambulatory Visit: Payer: BLUE CROSS/BLUE SHIELD | Admitting: Nutrition

## 2015-01-17 ENCOUNTER — Ambulatory Visit (HOSPITAL_BASED_OUTPATIENT_CLINIC_OR_DEPARTMENT_OTHER): Payer: BLUE CROSS/BLUE SHIELD | Admitting: Physician Assistant

## 2015-01-17 ENCOUNTER — Ambulatory Visit: Payer: BLUE CROSS/BLUE SHIELD

## 2015-01-17 ENCOUNTER — Encounter: Payer: Self-pay | Admitting: Physician Assistant

## 2015-01-17 VITALS — BP 117/72 | HR 92 | Temp 98.2°F | Resp 18 | Ht 68.0 in | Wt 246.0 lb

## 2015-01-17 DIAGNOSIS — C3491 Malignant neoplasm of unspecified part of right bronchus or lung: Secondary | ICD-10-CM

## 2015-01-17 DIAGNOSIS — Z5111 Encounter for antineoplastic chemotherapy: Secondary | ICD-10-CM

## 2015-01-17 DIAGNOSIS — C3490 Malignant neoplasm of unspecified part of unspecified bronchus or lung: Secondary | ICD-10-CM

## 2015-01-17 DIAGNOSIS — R197 Diarrhea, unspecified: Secondary | ICD-10-CM

## 2015-01-17 DIAGNOSIS — I10 Essential (primary) hypertension: Secondary | ICD-10-CM

## 2015-01-17 DIAGNOSIS — C3411 Malignant neoplasm of upper lobe, right bronchus or lung: Secondary | ICD-10-CM

## 2015-01-17 DIAGNOSIS — Z95828 Presence of other vascular implants and grafts: Secondary | ICD-10-CM

## 2015-01-17 DIAGNOSIS — I2510 Atherosclerotic heart disease of native coronary artery without angina pectoris: Secondary | ICD-10-CM

## 2015-01-17 LAB — COMPREHENSIVE METABOLIC PANEL (CC13)
ALBUMIN: 3.2 g/dL — AB (ref 3.5–5.0)
ALK PHOS: 99 U/L (ref 40–150)
ALT: 33 U/L (ref 0–55)
AST: 19 U/L (ref 5–34)
Anion Gap: 9 mEq/L (ref 3–11)
BILIRUBIN TOTAL: 0.65 mg/dL (ref 0.20–1.20)
BUN: 18.6 mg/dL (ref 7.0–26.0)
CALCIUM: 8.9 mg/dL (ref 8.4–10.4)
CHLORIDE: 105 meq/L (ref 98–109)
CO2: 24 meq/L (ref 22–29)
Creatinine: 0.9 mg/dL (ref 0.7–1.3)
EGFR: 82 mL/min/{1.73_m2} — AB (ref >90)
Glucose: 98 mg/dl (ref 70–140)
Potassium: 4.2 mEq/L (ref 3.5–5.1)
SODIUM: 138 meq/L (ref 136–145)
TOTAL PROTEIN: 6.1 g/dL — AB (ref 6.4–8.3)

## 2015-01-17 LAB — CBC WITH DIFFERENTIAL/PLATELET
BASO%: 0.4 % (ref 0.0–2.0)
Basophils Absolute: 0 10*3/uL (ref 0.0–0.1)
EOS ABS: 0 10*3/uL (ref 0.0–0.5)
EOS%: 0.2 % (ref 0.0–7.0)
HEMATOCRIT: 31.9 % — AB (ref 38.4–49.9)
HGB: 10.3 g/dL — ABNORMAL LOW (ref 13.0–17.1)
LYMPH%: 18.5 % (ref 14.0–49.0)
MCH: 27.5 pg (ref 27.2–33.4)
MCHC: 32.4 g/dL (ref 32.0–36.0)
MCV: 84.9 fL (ref 79.3–98.0)
MONO#: 0.3 10*3/uL (ref 0.1–0.9)
MONO%: 9.1 % (ref 0.0–14.0)
NEUT%: 71.8 % (ref 39.0–75.0)
NEUTROS ABS: 2.1 10*3/uL (ref 1.5–6.5)
Platelets: 178 10*3/uL (ref 140–400)
RBC: 3.76 10*6/uL — AB (ref 4.20–5.82)
RDW: 14.5 % (ref 11.0–14.6)
WBC: 3 10*3/uL — ABNORMAL LOW (ref 4.0–10.3)
lymph#: 0.6 10*3/uL — ABNORMAL LOW (ref 0.9–3.3)

## 2015-01-17 MED ORDER — SODIUM CHLORIDE 0.9 % IV SOLN
220.4000 mg | Freq: Once | INTRAVENOUS | Status: AC
  Administered 2015-01-17: 220 mg via INTRAVENOUS
  Filled 2015-01-17: qty 22

## 2015-01-17 MED ORDER — DEXAMETHASONE SODIUM PHOSPHATE 20 MG/5ML IJ SOLN
20.0000 mg | Freq: Once | INTRAMUSCULAR | Status: AC
  Administered 2015-01-17: 20 mg via INTRAVENOUS

## 2015-01-17 MED ORDER — DEXAMETHASONE SODIUM PHOSPHATE 20 MG/5ML IJ SOLN
INTRAMUSCULAR | Status: AC
  Filled 2015-01-17: qty 5

## 2015-01-17 MED ORDER — SODIUM CHLORIDE 0.9 % IV SOLN
Freq: Once | INTRAVENOUS | Status: AC
  Administered 2015-01-17: 14:00:00 via INTRAVENOUS

## 2015-01-17 MED ORDER — DEXTROSE 5 % IV SOLN
45.0000 mg/m2 | Freq: Once | INTRAVENOUS | Status: AC
  Administered 2015-01-17: 102 mg via INTRAVENOUS
  Filled 2015-01-17: qty 17

## 2015-01-17 MED ORDER — FAMOTIDINE IN NACL 20-0.9 MG/50ML-% IV SOLN
20.0000 mg | Freq: Once | INTRAVENOUS | Status: AC
  Administered 2015-01-17: 20 mg via INTRAVENOUS

## 2015-01-17 MED ORDER — ONDANSETRON 16 MG/50ML IVPB (CHCC)
INTRAVENOUS | Status: AC
  Filled 2015-01-17: qty 16

## 2015-01-17 MED ORDER — SODIUM CHLORIDE 0.9 % IJ SOLN
10.0000 mL | INTRAMUSCULAR | Status: DC | PRN
  Administered 2015-01-17: 10 mL via INTRAVENOUS
  Filled 2015-01-17: qty 10

## 2015-01-17 MED ORDER — SODIUM CHLORIDE 0.9 % IJ SOLN
10.0000 mL | INTRAMUSCULAR | Status: DC | PRN
  Administered 2015-01-17: 10 mL
  Filled 2015-01-17: qty 10

## 2015-01-17 MED ORDER — DIPHENHYDRAMINE HCL 50 MG/ML IJ SOLN
INTRAMUSCULAR | Status: AC
  Filled 2015-01-17: qty 1

## 2015-01-17 MED ORDER — FAMOTIDINE IN NACL 20-0.9 MG/50ML-% IV SOLN
INTRAVENOUS | Status: AC
  Filled 2015-01-17: qty 50

## 2015-01-17 MED ORDER — HEPARIN SOD (PORK) LOCK FLUSH 100 UNIT/ML IV SOLN
500.0000 [IU] | Freq: Once | INTRAVENOUS | Status: AC | PRN
  Administered 2015-01-17: 500 [IU]
  Filled 2015-01-17: qty 5

## 2015-01-17 MED ORDER — DIPHENHYDRAMINE HCL 50 MG/ML IJ SOLN
50.0000 mg | Freq: Once | INTRAMUSCULAR | Status: AC
  Administered 2015-01-17: 50 mg via INTRAVENOUS

## 2015-01-17 MED ORDER — ONDANSETRON 16 MG/50ML IVPB (CHCC)
16.0000 mg | Freq: Once | INTRAVENOUS | Status: AC
  Administered 2015-01-17: 16 mg via INTRAVENOUS

## 2015-01-17 NOTE — Telephone Encounter (Signed)
I have adjusted 3/7 appt

## 2015-01-17 NOTE — Progress Notes (Signed)
69 year old male diagnosed with non-small cell lung cancer.  He is a patient of Dr. Earlie Server.  Past medical history includes CAD, dyslipidemia, hypertension, atrial fibrillation, tobacco, alcohol, MI, stroke, CHF, and GERD.  Medications include Prozac, Lasix, multivitamin, Protonix, K-Dur, Compazine, Zocor.  Labs include sodium 135, glucose 182 and albumin 3.1.  Height: 68 inches. Weight: 246 pounds February 15. BMI: 37.41. (obese)  Per chart, patient requested nutrition appointment.   Patient reports a good appetite.   He does have  "a little diarrhea" and fatigue however neither of these are a big concern for him.  Nutrition diagnosis:  Food and nutrition related knowledge deficit related to diagnosis of lung cancer as evidenced by no prior need for nutrition related information.  Intervention:  Patient educated to consume smaller more frequent meals with adequate calories and increased protein to promote maintenance of lean body. Provided fact sheet on increasing calories and protein. Encouraged patient to contact me with questions.  Contact information was provided. Teach back method used.  Monitoring, evaluation, goals: Patient will tolerate adequate calories and protein to promote maintenance of lean body mass.  Next visit: Patient will contact me with questions or concerns.  No follow-up is scheduled.  **Disclaimer: This note was dictated with voice recognition software. Similar sounding words can inadvertently be transcribed and this note may contain transcription errors which may not have been corrected upon publication of note.**

## 2015-01-17 NOTE — Patient Instructions (Signed)

## 2015-01-17 NOTE — Patient Instructions (Signed)
[facility] Discharge Instructions for Patients Receiving Chemotherapy  Today you received the following chemotherapy agents :  Taxol, Carboplatin.  To help prevent nausea and vomiting after your treatment, we encourage you to take your nausea medication .  Take Compazine 10mg  by mouth every 6 hours as needed for nausea.  Watch for drowsiness after taking Compazine.   If you develop nausea and vomiting that is not controlled by your nausea medication, call the clinic.   BELOW ARE SYMPTOMS THAT SHOULD BE REPORTED IMMEDIATELY:  *FEVER GREATER THAN 100.5 F  *CHILLS WITH OR WITHOUT FEVER  NAUSEA AND VOMITING THAT IS NOT CONTROLLED WITH YOUR NAUSEA MEDICATION  *UNUSUAL SHORTNESS OF BREATH  *UNUSUAL BRUISING OR BLEEDING  TENDERNESS IN MOUTH AND THROAT WITH OR WITHOUT PRESENCE OF ULCERS  *URINARY PROBLEMS  *BOWEL PROBLEMS  UNUSUAL RASH Items with * indicate a potential emergency and should be followed up as soon as possible.  Feel free to call the clinic you have any questions or concerns. The clinic phone number is [phone].

## 2015-01-17 NOTE — Progress Notes (Signed)
No images are attached to the encounter. No scans are attached to the encounter. No scans are attached to the encounter. [place], [place] [address]  DIAGNOSIS: Non-small cell carcinoma of lung, stage 3   Staging form: Lung, AJCC 7th Edition     Clinical stage from 12/16/2014: Stage IIIA (T1b, N2, M0) - Signed by Curt Bears, MD on 12/18/2014       Staging comments: Squamous cell carcinoma  PRIOR THERAPY: none  CURRENT THERAPY: Concurrent chemoradiation with weekly carboplatin for an AUC of 2 and paclitaxel 45 g read square concurrent with radiation for 6-7 weeks.. Status post 3 weeks of treatment  INTERVAL HISTORY: Alejandro [name] 69 y.o. male returns for scheduled regular symptom management visit for followup of his recently diagnosed stage IIIa non-small cell lung cancer, squamous cell carcinoma. He is currently undergoing a course of concurrent chemoradiation, status post 3 weeks of treatment. He is scheduled for his last fraction of radiation on 02/09/2015. Overall is tolerating his course of concurrent chemotherapy relatively well with the exception of beginning have some mild difficulty with swallowing. He will make his radiation therapy care team aware of the symptoms. He also reports some diarrhea over the past week. He describes it as watery and having 2-3 episodes per day. He did not try Imodium. He there are no other family members had similar symptoms. This has not been associated with any fever or chills. He remains short of breath and hoarse.These symptoms were present prior to his starting his course of concurrent chemoradiation. He tolerated his first week of treatment without difficulty. She denies any nausea, vomiting, diarrhea or constipation. He said no fever, chills cough or hemoptysis. Denies significant weight loss or night sweats. He presents to proceed with week #5 of treatment.    MEDICAL HISTORY: Past Medical History  Diagnosis Date  . Systolic heart failure   . 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 Dyslipidemia   . HTN (hypertension)   . Atrial fibrillation   . Tobacco abuse   . Alcohol abuse   . H/O hiatal hernia   . Myocardial infarction   . Dysrhythmia     atrial fib  . Stroke 5/15  . CHF (congestive heart failure)   . Shortness of breath     occ  . GERD (gastroesophageal reflux disease)   . Cancer     around nose.  New dx of lung cancer  . S/P chemotherapy, time since 4-12 weeks     ALLERGIES:  has No Known Allergies.  MEDICATIONS:  Current Outpatient Prescriptions  Medication Sig Dispense Refill  . aspirin 81 MG tablet Take 81 mg by mouth daily.    . carvedilol (COREG) 12.5 MG tablet Take 12.5 mg by mouth 2 (two) times daily with a meal.     . FLUoxetine (PROZAC) 20 MG tablet Take 1 tablet (20 mg total) by mouth daily. 30 tablet 6  . furosemide (LASIX) 40 MG tablet Take one tablet by mouth one time daily 30 tablet 5  . hyaluronate sodium (RADIAPLEXRX) GEL Apply 1 application topically 2 (two) times daily.    Marland Kitchen levETIRAcetam (KEPPRA) 500 MG tablet Take 1 tablet (500 mg total) by mouth 2 (two) times daily. 60 tablet 6  . lidocaine-prilocaine (EMLA) cream Apply 1 application topically as needed. 30 g 2  . Multiple Vitamin (MULTIVITAMIN WITH  MINERALS) TABS tablet Take 1 tablet by mouth daily.    . pantoprazole (PROTONIX) 40 MG tablet Take 1 tablet (40 mg total) by mouth at bedtime. 30 tablet 6  . potassium chloride (K-DUR,KLOR-CON) 10 MEQ tablet TAKE ONE TABLET BY MOUTH ONE TIME DAILY  30 tablet 6  . PRESCRIPTION MEDICATION Last dose 01/03/15. At the cancer center. Dr Julien Nordmann    . prochlorperazine (COMPAZINE) 10 MG tablet Take 1 tablet (10 mg total) by mouth every 6 (six) hours as needed for nausea or vomiting. 30 tablet 1  . simvastatin (ZOCOR) 40 MG tablet Take 1 tablet (40 mg total) by mouth every evening. 30 tablet 4   . lisinopril (PRINIVIL,ZESTRIL) 2.5 MG tablet TAKE ONE TABLET BY MOUTH ONE TIME DAILY  30 tablet 5   No current facility-administered medications for this visit.   Facility-Administered Medications Ordered in Other Visits  Medication Dose Route Frequency Provider Last Rate Last Dose  . CARBOplatin (PARAPLATIN) 220 mg in sodium chloride 0.9 % 100 mL chemo infusion  220 mg Intravenous Once Curt Bears, MD      . heparin lock flush 100 unit/mL  500 Units Intracatheter Once PRN Curt Bears, MD      . sodium chloride 0.9 % injection 10 mL  10 mL Intracatheter PRN Curt Bears, MD        SURGICAL HISTORY:  Past Surgical History  Procedure Laterality Date  . Umbilical hernia repair  2010  . Hernia repair  2012  . Ventral hernia repair N/A 01/22/2013    Procedure: HERNIA REPAIR VENTRAL ADULT;  Surgeon: [name], MD;  Location: [facility];  Service: General;  Laterality: N/A;  . Application of a-cell of chest/abdomen N/A 01/22/2013    Procedure: APPLICATION OF A-CELL OF CHEST/ABDOMEN;  Surgeon: [name], MD;  Location: [facility];  Service: General;  Laterality: N/A;  . Cystoscopy N/A 01/22/2013    Procedure: Consuela Mimes;  Surgeon: [name], MD;  Location: [facility];  Service: Urology;  Laterality: N/A;  Cystoscopy with balloon dilation. Insertion of coude catheter.  . Craniotomy Right 04/19/2014    Procedure: CRANIOTOMY HEMATOMA EVACUATION SUBDURAL;  Surgeon: [name], MD;  Location: [facility];  Service: Neurosurgery;  Laterality: Right;  right  . Craniotomy Right 04/23/2014    Procedure: Craniotomy for Intracerebral Hemorrhage;  Surgeon: [name], MD;  Location: [facility];  Service: Neurosurgery;  Laterality: Right;  . 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 Coronary artery bypass graft  12/24/2008    4 vessel  .  Coronary artery bypass graft  2012?  Marland Kitchen Craniotomy N/A 09/13/2014    Procedure: CRANIOTOMY BONE FLAP/PROSTHETIC PLATE;  Surgeon: [name], MD;  Location: [facility];  Service: Neurosurgery;  Laterality: N/A;  . Video bronchoscopy with endobronchial ultrasound N/A 11/09/2014    Procedure: VIDEO BRONCHOSCOPY WITH ENDOBRONCHIAL ULTRASOUND;  Surgeon: Collene Gobble, MD;  Location: [facility];  Service: Thoracic;  Laterality: N/A;    REVIEW OF SYSTEMS:  Review of Systems  Constitutional: Negative for fever, chills, weight loss, malaise/fatigue and diaphoresis.  HENT: Negative for congestion, ear discharge, ear pain, hearing loss, nosebleeds, sore throat and tinnitus.        Is beginning to have some mild difficulty swallowing related to his radiation therapy. Also notes continued hoarseness to  his voice.  Eyes: Negative for blurred vision, double vision, photophobia, pain, discharge and redness.  Respiratory: Positive for shortness of breath. Negative for cough, hemoptysis, sputum production, wheezing and stridor.   Cardiovascular: Negative for chest pain, palpitations, orthopnea, claudication, leg swelling and PND.  Gastrointestinal: Positive for diarrhea. Negative for heartburn, nausea, vomiting, abdominal pain, constipation, blood in stool and melena.  Genitourinary: Negative.   Musculoskeletal: Negative.   Skin: Negative.   Neurological: Negative for dizziness, tingling, focal weakness, seizures, weakness and headaches.  Endo/Heme/Allergies: Does not bruise/bleed easily.  Psychiatric/Behavioral: Negative for depression. The patient is not nervous/anxious and does not have insomnia.      PHYSICAL EXAMINATION: Physical Exam  Constitutional: He is oriented to person, place, and time and well-developed, well-nourished, and in no distress.  HENT:  Head: Normocephalic and atraumatic.  Mouth/Throat: Oropharynx is clear and moist.  Eyes: Pupils are equal, round, and reactive to light.  Neck:  Normal range of motion. Neck supple. No JVD present. No tracheal deviation present. No thyromegaly present.  Cardiovascular: Normal rate, regular rhythm, normal heart sounds and intact distal pulses.  Exam reveals no gallop and no friction rub.   No murmur heard. Pulmonary/Chest: Effort normal and breath sounds normal. No respiratory distress. He has no wheezes. He has no rales.  Abdominal: Soft. Bowel sounds are normal. He exhibits no distension and no mass. There is no tenderness.  Musculoskeletal: Normal range of motion. He exhibits no edema or tenderness.  Lymphadenopathy:    He has no cervical adenopathy.  Neurological: He is alert and oriented to person, place, and time. He has normal reflexes. Gait normal.  Skin: Skin is warm and dry. No rash noted.    ECOG PERFORMANCE STATUS: 1 - Symptomatic but completely ambulatory  Blood pressure 117/72, pulse 92, temperature 98.2 F (36.8 C), temperature source Oral, resp. rate 18, height 5' 8"  (1.727 m), weight 246 lb (111.585 kg), SpO2 99 %.  LABORATORY DATA: Lab Results  Component Value Date   WBC 3.0* 01/17/2015   HGB 10.3* 01/17/2015   HCT 31.9* 01/17/2015   MCV 84.9 01/17/2015   PLT 178 01/17/2015      Chemistry      Component Value Date/Time   NA 138 01/17/2015 1214   NA 134* 01/07/2015 0800   K 4.2 01/17/2015 1214   K 4.8 01/07/2015 0800   CL 103 01/07/2015 0800   CO2 24 01/17/2015 1214   CO2 24 01/07/2015 0800   BUN 18.6 01/17/2015 1214   BUN 31* 01/07/2015 0800   CREATININE 0.9 01/17/2015 1214   CREATININE 1.03 01/07/2015 0800   CREATININE 0.99 09/26/2012 1526      Component Value Date/Time   CALCIUM 8.9 01/17/2015 1214   CALCIUM 8.7 01/07/2015 0800   ALKPHOS 99 01/17/2015 1214   ALKPHOS 68 09/23/2014 0922   AST 19 01/17/2015 1214   AST 24 09/23/2014 0922   ALT 33 01/17/2015 1214   ALT 18 09/23/2014 0922   BILITOT 0.65 01/17/2015 1214   BILITOT 0.4 09/23/2014 0922       RADIOGRAPHIC STUDIES:  Ir Fluoro  Guide Cv Line Right  01/07/2015   INDICATION: History of lung cancer. In need of durable intravenous access for chemotherapy administration.  EXAM: IMPLANTED PORT A CATH PLACEMENT WITH ULTRASOUND AND FLUOROSCOPIC GUIDANCE  COMPARISON:  Chest CT - 11/29/2014  MEDICATIONS: Ancef 2 gm IV; The antibiotic was administered within an appropriate time interval prior to skin puncture.  ANESTHESIA/SEDATION: Versed 2 mg IV; Fentanyl 75  mcg IV;  Total Moderate Sedation Time  25  minutes.  CONTRAST:  None  FLUOROSCOPY TIME:  30 seconds (34 mGy)  COMPLICATIONS: None immediate  PROCEDURE: The procedure, risks, benefits, and alternatives were explained to the patient. Questions regarding the procedure were encouraged and answered. The patient understands and consents to the procedure.  The right neck and chest were prepped with chlorhexidine in a sterile fashion, and a sterile drape was applied covering the operative field. Maximum barrier sterile technique with sterile gowns and gloves were used for the procedure. A timeout was performed prior to the initiation of the procedure. Local anesthesia was provided with 1% lidocaine with epinephrine.  After creating a small venotomy incision, a micropuncture kit was utilized to access the internal jugular vein under direct, real-time ultrasound guidance. Ultrasound image documentation was performed. The microwire was kinked to measure appropriate catheter length.  A subcutaneous port pocket was then created along the upper chest wall utilizing a combination of sharp and blunt dissection. The pocket was irrigated with sterile saline. A single lumen ISP power injectable port was chosen for placement. The 8 Fr catheter was tunneled from the port pocket site to the venotomy incision. The port was placed in the pocket. The external catheter was trimmed to appropriate length. At the venotomy, an 8 Fr peel-away sheath was placed over a guidewire under fluoroscopic guidance. The catheter was  then placed through the sheath and the sheath was removed. Final catheter positioning was confirmed and documented with a fluoroscopic spot radiograph. The port was accessed with a Huber needle, aspirated and flushed with heparinized saline.  The venotomy site was closed with an interrupted 4-0 Vicryl suture. The port pocket incision was closed with interrupted 2-0 Vicryl suture and the skin was opposed with a running subcuticular 4-0 Vicryl suture. Dermabond and Steri-strips were applied to both incisions. Dressings were placed. The patient tolerated the procedure well without immediate post procedural complication.  FINDINGS: After catheter placement, the tip lies within the superior cavoatrial junction. The catheter aspirates and flushes normally and is ready for immediate use.  IMPRESSION: Successful placement of a right internal jugular approach power injectable Port-A-Cath. The catheter is ready for immediate use.   Electronically Signed   By: Sandi Mariscal M.D.   On: 01/07/2015 11:00   Ir US Guide Vasc Access Right  01/07/2015   INDICATION: History of lung cancer. In need of durable intravenous access for chemotherapy administration.  EXAM: IMPLANTED PORT A CATH PLACEMENT WITH ULTRASOUND AND FLUOROSCOPIC GUIDANCE  COMPARISON:  Chest CT - 11/29/2014  MEDICATIONS: Ancef 2 gm IV; The antibiotic was administered within an appropriate time interval prior to skin puncture.  ANESTHESIA/SEDATION: Versed 2 mg IV; Fentanyl 75 mcg IV;  Total Moderate Sedation Time  25  minutes.  CONTRAST:  None  FLUOROSCOPY TIME:  30 seconds (34 mGy)  COMPLICATIONS: None immediate  PROCEDURE: The procedure, risks, benefits, and alternatives were explained to the patient. Questions regarding the procedure were encouraged and answered. The patient understands and consents to the procedure.  The right neck and chest were prepped with chlorhexidine in a sterile fashion, and a sterile drape was applied covering the operative field. Maximum  barrier sterile technique with sterile gowns and gloves were used for the procedure. A timeout was performed prior to the initiation of the procedure. Local anesthesia was provided with 1% lidocaine with epinephrine.  After creating a small venotomy incision, a micropuncture kit was utilized to access the internal jugular vein under  direct, real-time ultrasound guidance. Ultrasound image documentation was performed. The microwire was kinked to measure appropriate catheter length.  A subcutaneous port pocket was then created along the upper chest wall utilizing a combination of sharp and blunt dissection. The pocket was irrigated with sterile saline. A single lumen ISP power injectable port was chosen for placement. The 8 Fr catheter was tunneled from the port pocket site to the venotomy incision. The port was placed in the pocket. The external catheter was trimmed to appropriate length. At the venotomy, an 8 Fr peel-away sheath was placed over a guidewire under fluoroscopic guidance. The catheter was then placed through the sheath and the sheath was removed. Final catheter positioning was confirmed and documented with a fluoroscopic spot radiograph. The port was accessed with a Huber needle, aspirated and flushed with heparinized saline.  The venotomy site was closed with an interrupted 4-0 Vicryl suture. The port pocket incision was closed with interrupted 2-0 Vicryl suture and the skin was opposed with a running subcuticular 4-0 Vicryl suture. Dermabond and Steri-strips were applied to both incisions. Dressings were placed. The patient tolerated the procedure well without immediate post procedural complication.  FINDINGS: After catheter placement, the tip lies within the superior cavoatrial junction. The catheter aspirates and flushes normally and is ready for immediate use.  IMPRESSION: Successful placement of a right internal jugular approach power injectable Port-A-Cath. The catheter is ready for immediate use.    Electronically Signed   By: Sandi Mariscal M.D.   On: 01/07/2015 11:00     ASSESSMENT/PLAN:  No problem-specific assessment & plan notes found for this encounter.  patient is a pleasant 69 year old Caucasian male recently diagnosed with stage IIIa (T2a, N2, M0) non-small cell lung cancer, squamous cell carcinoma diagnosed December 2015. Presented with right upper lobe lung mass in addition to mediastinal lymphadenopathy. He is currently undergoing a course of concurrent chemoradiation with weekly chemotherapy in the form of carboplatin for an AUC of 2 and paclitaxel at 45 mg/m. He status post 3 weeks of treatment. Patient was discussed with and also seen by Dr. Julien Nordmann. He will continue with this course of concurrent chemoradiation as scheduled. Patient was advised to use Imodium as directed for the diarrhea. He is also advised to inform radiation therapy about his discomfort with swallowing. Patient voiced understanding of these instructions. He'll follow-up in 3 weeks for another symptom management visit.  Awilda Metro E, PA-C 01/17/2015  All questions were answered. The patient knows to call the clinic with any problems, questions or concerns. We can certainly see the patient much sooner if necessary.  ADDENDUM: Hematology/Oncology Attending: I had a face to face encounter with the patient today. I recommended his care plan. This is a very pleasant 69 years old white male with a stage IIIA non-small cell lung cancer currently undergoing a course of concurrent chemoradiation with weekly carboplatin and paclitaxel. He is tolerating his treatment well with no significant adverse effects except for few episodes of diarrhea recently. I recommended for the patient to take Imodium for diarrhea as needed. He will proceed with cycle #4 today of his treatment as scheduled. The patient would come back for follow-up visit in 3 weeks for reevaluation and management of any adverse effect of his  treatment. He was advised to call immediately if he has any concerning symptoms in the interval. Disclaimer: This note was dictated with voice recognition software. Similar sounding words can inadvertently be transcribed and may be missed upon review. Eilleen Kempf., MD 01/17/2015

## 2015-01-17 NOTE — Patient Instructions (Signed)
Take Imodium as instructed as needed for diarrhea Be sure to inform radiation therapy that she was starting to have difficulty swallowing Continue your course of concurrent chemoradiation as scheduled Follow-up in 3 weeks for another symptom management visit

## 2015-01-18 ENCOUNTER — Telehealth: Payer: Self-pay | Admitting: Internal Medicine

## 2015-01-18 ENCOUNTER — Ambulatory Visit
Admission: RE | Admit: 2015-01-18 | Discharge: 2015-01-18 | Disposition: A | Discharge status: Home / Self Care | Payer: BLUE CROSS/BLUE SHIELD | Source: Ambulatory Visit | Attending: Radiation Oncology | Admitting: Radiation Oncology

## 2015-01-18 DIAGNOSIS — C3490 Malignant neoplasm of unspecified part of unspecified bronchus or lung: Secondary | ICD-10-CM | POA: Diagnosis not present

## 2015-01-18 NOTE — Telephone Encounter (Signed)
Pt confirmed labs/ov per 02/15 POF, gave pt AVS..... KJ, sent msg to move chemo down due to adding MD visit.Marland KitchenMarland Kitchen

## 2015-01-19 ENCOUNTER — Ambulatory Visit
Admission: RE | Admit: 2015-01-19 | Discharge: 2015-01-19 | Disposition: A | Discharge status: Home / Self Care | Payer: BLUE CROSS/BLUE SHIELD | Source: Ambulatory Visit | Attending: Radiation Oncology | Admitting: Radiation Oncology

## 2015-01-19 DIAGNOSIS — C3490 Malignant neoplasm of unspecified part of unspecified bronchus or lung: Secondary | ICD-10-CM | POA: Diagnosis not present

## 2015-01-20 ENCOUNTER — Ambulatory Visit
Admission: RE | Admit: 2015-01-20 | Discharge: 2015-01-20 | Disposition: A | Discharge status: Home / Self Care | Payer: BLUE CROSS/BLUE SHIELD | Source: Ambulatory Visit | Attending: Radiation Oncology | Admitting: Radiation Oncology

## 2015-01-20 ENCOUNTER — Encounter: Payer: BLUE CROSS/BLUE SHIELD | Admitting: Nutrition

## 2015-01-20 DIAGNOSIS — C3490 Malignant neoplasm of unspecified part of unspecified bronchus or lung: Secondary | ICD-10-CM | POA: Diagnosis not present

## 2015-01-20 NOTE — Progress Notes (Signed)
  Radiation Oncology         [phone] ________________________________  Name: Alejandro [name] MRN: [id number]  Date: 01/21/2015  DOB: May 16, 1946  Weekly Radiation Therapy Management    ICD-9-CM ICD-10-CM   1. Non-small cell carcinoma of lung, stage 3, unspecified laterality 162.9 C34.90     Current Dose: 40 Gy     Planned Dose:  66 Gy  Narrative . . . . . . . . The patient presents for routine under treatment assessment.                                   Malakhai Beitler has completed 20 fractions to his right chest. He denies pain. He reports pain with swallowing hot and cold liquids. Hoarse voice noted. He continues to have shortness of breath. His oxygen saturation on room air is 100%. He reports an occasional dry cough. He reports a good appetite. He reports that when he blows his nose, he has been noticing that it is bloody. He had chemotherapy on Monday. His skin is intact on his chest and back. He is using radiaplex gel. His heart rate was 129 and irregular. He said he has A Fib                                 Set-up films were reviewed.                                 The chart was checked. Physical Findings. . .  height is 5\' 8"  (1.727 m) and weight is 256 lb 4.8 oz (116.257 kg). His oral temperature is 97.5 F (36.4 C). His blood pressure is 137/83 and his pulse is 129. His respiration is 16 and oxygen saturation is 100%. . Weight essentially stable.  No significant changes. Impression . . . . . . . The patient is tolerating radiation. Plan . . . . . . . . . . . . Continue treatment as planned.  ________________________________  Sheral Apley. Tammi Klippel, M.D.

## 2015-01-21 ENCOUNTER — Encounter: Payer: Self-pay | Admitting: Radiation Oncology

## 2015-01-21 ENCOUNTER — Ambulatory Visit
Admission: RE | Admit: 2015-01-21 | Discharge: 2015-01-21 | Disposition: A | Discharge status: Home / Self Care | Payer: BLUE CROSS/BLUE SHIELD | Source: Ambulatory Visit | Attending: Radiation Oncology | Admitting: Radiation Oncology

## 2015-01-21 VITALS — BP 137/83 | HR 129 | Temp 97.5°F | Resp 16 | Ht 68.0 in | Wt 256.3 lb

## 2015-01-21 DIAGNOSIS — C3490 Malignant neoplasm of unspecified part of unspecified bronchus or lung: Secondary | ICD-10-CM

## 2015-01-21 NOTE — Progress Notes (Signed)
Alejandro [name] has completed 20 fractions to his right chest.  He denies pain.  He reports pain with swallowing hot and cold liquids.  Hoarse voice noted.  He continues to have shortness of breath.  His oxygen saturation on room air is 100%.   He reports an occasional dry cough. He reports a good appetite.  He reports that when he blows his nose, he has been noticing that it is bloody.  He had chemotherapy on Monday.  His skin is intact on his chest and back.  He is using radiaplex gel.  His heart rate was 129 and irregular.  He said he has A Fib.  BP 137/83 mmHg  Pulse 129  Temp(Src) 97.5 F (36.4 C) (Oral)  Resp 16  Ht 5\' 8"  (1.727 m)  Wt 256 lb 4.8 oz (116.257 kg)  BMI 38.98 kg/m2  SpO2 100%

## 2015-01-24 ENCOUNTER — Other Ambulatory Visit: Payer: Self-pay

## 2015-01-24 ENCOUNTER — Ambulatory Visit
Admission: RE | Admit: 2015-01-24 | Discharge: 2015-01-24 | Disposition: A | Discharge status: Home / Self Care | Payer: BLUE CROSS/BLUE SHIELD | Source: Ambulatory Visit | Attending: Radiation Oncology | Admitting: Radiation Oncology

## 2015-01-24 ENCOUNTER — Ambulatory Visit (HOSPITAL_BASED_OUTPATIENT_CLINIC_OR_DEPARTMENT_OTHER): Payer: BLUE CROSS/BLUE SHIELD

## 2015-01-24 ENCOUNTER — Other Ambulatory Visit (HOSPITAL_BASED_OUTPATIENT_CLINIC_OR_DEPARTMENT_OTHER): Payer: BLUE CROSS/BLUE SHIELD

## 2015-01-24 ENCOUNTER — Ambulatory Visit: Payer: BLUE CROSS/BLUE SHIELD

## 2015-01-24 ENCOUNTER — Telehealth: Payer: Self-pay | Admitting: Internal Medicine

## 2015-01-24 DIAGNOSIS — Z95828 Presence of other vascular implants and grafts: Secondary | ICD-10-CM

## 2015-01-24 DIAGNOSIS — C3491 Malignant neoplasm of unspecified part of right bronchus or lung: Secondary | ICD-10-CM

## 2015-01-24 DIAGNOSIS — C3490 Malignant neoplasm of unspecified part of unspecified bronchus or lung: Secondary | ICD-10-CM | POA: Diagnosis not present

## 2015-01-24 DIAGNOSIS — C3411 Malignant neoplasm of upper lobe, right bronchus or lung: Secondary | ICD-10-CM

## 2015-01-24 LAB — CBC WITH DIFFERENTIAL/PLATELET
BASO%: 0.6 % (ref 0.0–2.0)
Basophils Absolute: 0 10*3/uL (ref 0.0–0.1)
EOS%: 0 % (ref 0.0–7.0)
Eosinophils Absolute: 0 10*3/uL (ref 0.0–0.5)
HCT: 26.4 % — ABNORMAL LOW (ref 38.4–49.9)
HGB: 9 g/dL — ABNORMAL LOW (ref 13.0–17.1)
LYMPH#: 0.4 10*3/uL — AB (ref 0.9–3.3)
LYMPH%: 23.8 % (ref 14.0–49.0)
MCH: 28.3 pg (ref 27.2–33.4)
MCHC: 34.1 g/dL (ref 32.0–36.0)
MCV: 83 fL (ref 79.3–98.0)
MONO#: 0.2 10*3/uL (ref 0.1–0.9)
MONO%: 9.1 % (ref 0.0–14.0)
NEUT#: 1.1 10*3/uL — ABNORMAL LOW (ref 1.5–6.5)
NEUT%: 66.5 % (ref 39.0–75.0)
NRBC: 0 % (ref 0–0)
Platelets: 71 10*3/uL — ABNORMAL LOW (ref 140–400)
RBC: 3.18 10*6/uL — AB (ref 4.20–5.82)
RDW: 15.2 % — ABNORMAL HIGH (ref 11.0–14.6)
WBC: 1.6 10*3/uL — AB (ref 4.0–10.3)

## 2015-01-24 LAB — COMPREHENSIVE METABOLIC PANEL (CC13)
ALBUMIN: 2.8 g/dL — AB (ref 3.5–5.0)
ALT: 33 U/L (ref 0–55)
ANION GAP: 8 meq/L (ref 3–11)
AST: 26 U/L (ref 5–34)
Alkaline Phosphatase: 81 U/L (ref 40–150)
BILIRUBIN TOTAL: 0.58 mg/dL (ref 0.20–1.20)
BUN: 22.3 mg/dL (ref 7.0–26.0)
CO2: 23 mEq/L (ref 22–29)
CREATININE: 0.9 mg/dL (ref 0.7–1.3)
Calcium: 8.4 mg/dL (ref 8.4–10.4)
Chloride: 106 mEq/L (ref 98–109)
EGFR: 85 mL/min/{1.73_m2} — ABNORMAL LOW (ref >90)
Glucose: 103 mg/dl (ref 70–140)
Potassium: 4.1 mEq/L (ref 3.5–5.1)
SODIUM: 137 meq/L (ref 136–145)
TOTAL PROTEIN: 5.4 g/dL — AB (ref 6.4–8.3)

## 2015-01-24 LAB — TECHNOLOGIST REVIEW

## 2015-01-24 MED ORDER — SODIUM CHLORIDE 0.9 % IJ SOLN
10.0000 mL | INTRAMUSCULAR | Status: DC | PRN
  Administered 2015-01-24: 10 mL via INTRAVENOUS
  Filled 2015-01-24: qty 10

## 2015-01-24 MED ORDER — PANTOPRAZOLE SODIUM 40 MG PO TBEC: 40.0000 mg | DELAYED_RELEASE_TABLET | Freq: Every day | ORAL | Status: DC

## 2015-01-24 MED ORDER — SIMVASTATIN 40 MG PO TABS: 40.0000 mg | ORAL_TABLET | Freq: Every evening | ORAL | Status: DC

## 2015-01-24 NOTE — Telephone Encounter (Signed)
Simvastatin refilled to [facility].

## 2015-01-24 NOTE — Progress Notes (Signed)
Per Dr. Julien Nordmann, no tx today d/t labs. Labs given to pt and explained in lobby by Diane B. RN

## 2015-01-24 NOTE — Patient Instructions (Signed)

## 2015-01-24 NOTE — Telephone Encounter (Signed)
Caller name: vita from [facility] Relation to pt: Call back number: [phone] Pharmacy:  Reason for call:   Requesting simvasatatin refill for patient

## 2015-01-25 ENCOUNTER — Ambulatory Visit
Admission: RE | Admit: 2015-01-25 | Discharge: 2015-01-25 | Disposition: A | Discharge status: Home / Self Care | Payer: BLUE CROSS/BLUE SHIELD | Source: Ambulatory Visit | Attending: Radiation Oncology | Admitting: Radiation Oncology

## 2015-01-25 DIAGNOSIS — C3490 Malignant neoplasm of unspecified part of unspecified bronchus or lung: Secondary | ICD-10-CM | POA: Diagnosis not present

## 2015-01-26 ENCOUNTER — Ambulatory Visit
Admission: RE | Admit: 2015-01-26 | Discharge: 2015-01-26 | Disposition: A | Discharge status: Home / Self Care | Payer: BLUE CROSS/BLUE SHIELD | Source: Ambulatory Visit | Attending: Radiation Oncology | Admitting: Radiation Oncology

## 2015-01-26 DIAGNOSIS — C3490 Malignant neoplasm of unspecified part of unspecified bronchus or lung: Secondary | ICD-10-CM | POA: Diagnosis not present

## 2015-01-27 ENCOUNTER — Ambulatory Visit
Admission: RE | Admit: 2015-01-27 | Discharge: 2015-01-27 | Disposition: A | Discharge status: Home / Self Care | Payer: BLUE CROSS/BLUE SHIELD | Source: Ambulatory Visit | Attending: Radiation Oncology | Admitting: Radiation Oncology

## 2015-01-27 DIAGNOSIS — C3490 Malignant neoplasm of unspecified part of unspecified bronchus or lung: Secondary | ICD-10-CM | POA: Diagnosis not present

## 2015-01-28 ENCOUNTER — Encounter: Payer: Self-pay | Admitting: Radiation Oncology

## 2015-01-28 ENCOUNTER — Ambulatory Visit
Admission: RE | Admit: 2015-01-28 | Discharge: 2015-01-28 | Disposition: A | Discharge status: Home / Self Care | Payer: BLUE CROSS/BLUE SHIELD | Source: Ambulatory Visit | Attending: Radiation Oncology | Admitting: Radiation Oncology

## 2015-01-28 VITALS — BP 99/58 | HR 83 | Resp 18 | Wt 246.7 lb

## 2015-01-28 DIAGNOSIS — C3490 Malignant neoplasm of unspecified part of unspecified bronchus or lung: Secondary | ICD-10-CM | POA: Diagnosis not present

## 2015-01-28 DIAGNOSIS — C3411 Malignant neoplasm of upper lobe, right bronchus or lung: Secondary | ICD-10-CM

## 2015-01-28 MED ORDER — HYDROCODONE-ACETAMINOPHEN 5-325 MG PO TABS: 1.0000 | ORAL_TABLET | Freq: Four times a day (QID) | ORAL | Status: DC | PRN

## 2015-01-28 NOTE — Progress Notes (Signed)
  Radiation Oncology         [phone] ________________________________  Name: Alejandro [name] MRN: [id number]  Date: 01/28/2015  DOB: [birth year]  Weekly Radiation Therapy Management    ICD-9-CM ICD-10-CM   1. Primary cancer of right upper lobe of lung 162.3 C34.11 HYDROcodone-acetaminophen (NORCO/VICODIN) 5-325 MG per tablet    Current Dose: 50 Gy     Planned Dose:  66 Gy  Narrative . . . . . . . . The patient presents for routine under treatment assessment.                                   Patient opened shirt and questioned my RN if his power port needle should still be in. Questioned patient. He explains his port was accessed on Monday to obtain labs. He goes on to explain that his platelet count came back low thus, his chemotherapy was held. He states, "no one removed it so I assumed it should stay in." Wife confirms this story. Patient explains he has showered and resumed his normal daily activities while his port was accessed. Site without redness or edema. Flush port per protocol. Removed access needle. Needle intact upon removal. Applied a bandaid to old access site. Patient tolerated well.                                 Set-up films were reviewed.                                 The chart was checked. Physical Findings. . .  weight is 246 lb 11.2 oz (111.902 kg). His blood pressure is 99/58 and his pulse is 83. His respiration is 18 and oxygen saturation is 99%. . Weight essentially stable.  No significant changes. Impression . . . . . . . The patient is tolerating radiation. Plan . . . . . . . . . . . . Continue treatment as planned.  Given hydrocodone for cough  ________________________________  Sheral Apley. Tammi Klippel, M.D.

## 2015-01-28 NOTE — Addendum Note (Signed)
Encounter addended by: Lora Paula, MD on: 01/28/2015  4:03 PM<BR>     Documentation filed: Clinical Notes

## 2015-01-28 NOTE — Progress Notes (Signed)
Patient opened shirt during PUT and questioned this RN if his power port needle should still be in. Questioned patient. He explains his port was accessed on Monday to obtain labs. He goes on to explain that his platelet count came back low thus, his chemotherapy was held. He states, "no one removed it so I assumed it should stay in." Wife confirms this story. Patient explains he has showered and resumed his normal daily activities while his port was accessed. Site without redness or edema. Flush port per protocol. Removed access needle. Needle intact upon removal. Applied a bandaid to old access site. Patient tolerated well.

## 2015-01-28 NOTE — Progress Notes (Signed)
Reports sore throat continues. Denies difficulty swallowing. Hoarseness noted. Denies skin changes within treatment field. Reports he continues to use radiaplex bid as directed. Reports a persistent dry cough that wakes him up at night. Patient has not been prescribed cough medication up to this point. Weight stable. BP low. Reports chemo was held this week do to low platelet counts. Reports power port needle has been in place since Monday.

## 2015-01-31 ENCOUNTER — Ambulatory Visit (HOSPITAL_BASED_OUTPATIENT_CLINIC_OR_DEPARTMENT_OTHER): Payer: BLUE CROSS/BLUE SHIELD

## 2015-01-31 ENCOUNTER — Ambulatory Visit
Admission: RE | Admit: 2015-01-31 | Discharge: 2015-01-31 | Disposition: A | Discharge status: Home / Self Care | Payer: BLUE CROSS/BLUE SHIELD | Source: Ambulatory Visit | Attending: Radiation Oncology | Admitting: Radiation Oncology

## 2015-01-31 ENCOUNTER — Ambulatory Visit: Payer: BLUE CROSS/BLUE SHIELD

## 2015-01-31 ENCOUNTER — Telehealth: Payer: Self-pay

## 2015-01-31 ENCOUNTER — Other Ambulatory Visit (HOSPITAL_BASED_OUTPATIENT_CLINIC_OR_DEPARTMENT_OTHER): Payer: BLUE CROSS/BLUE SHIELD

## 2015-01-31 DIAGNOSIS — C3411 Malignant neoplasm of upper lobe, right bronchus or lung: Secondary | ICD-10-CM

## 2015-01-31 DIAGNOSIS — C3490 Malignant neoplasm of unspecified part of unspecified bronchus or lung: Secondary | ICD-10-CM | POA: Diagnosis not present

## 2015-01-31 DIAGNOSIS — C3491 Malignant neoplasm of unspecified part of right bronchus or lung: Secondary | ICD-10-CM

## 2015-01-31 DIAGNOSIS — Z5111 Encounter for antineoplastic chemotherapy: Secondary | ICD-10-CM

## 2015-01-31 DIAGNOSIS — Z95828 Presence of other vascular implants and grafts: Secondary | ICD-10-CM

## 2015-01-31 LAB — CBC WITH DIFFERENTIAL/PLATELET
BASO%: 1.1 % (ref 0.0–2.0)
Basophils Absolute: 0 10*3/uL (ref 0.0–0.1)
EOS ABS: 0 10*3/uL (ref 0.0–0.5)
EOS%: 0.2 % (ref 0.0–7.0)
HEMATOCRIT: 29.2 % — AB (ref 38.4–49.9)
HEMOGLOBIN: 9.8 g/dL — AB (ref 13.0–17.1)
LYMPH%: 16.2 % (ref 14.0–49.0)
MCH: 28.3 pg (ref 27.2–33.4)
MCHC: 33.5 g/dL (ref 32.0–36.0)
MCV: 84.7 fL (ref 79.3–98.0)
MONO#: 0.4 10*3/uL (ref 0.1–0.9)
MONO%: 16.5 % — AB (ref 0.0–14.0)
NEUT%: 66 % (ref 39.0–75.0)
NEUTROS ABS: 1.7 10*3/uL (ref 1.5–6.5)
Platelets: 143 10*3/uL (ref 140–400)
RBC: 3.44 10*6/uL — ABNORMAL LOW (ref 4.20–5.82)
RDW: 15.5 % — ABNORMAL HIGH (ref 11.0–14.6)
WBC: 2.6 10*3/uL — ABNORMAL LOW (ref 4.0–10.3)
lymph#: 0.4 10*3/uL — ABNORMAL LOW (ref 0.9–3.3)

## 2015-01-31 LAB — COMPREHENSIVE METABOLIC PANEL (CC13)
ALK PHOS: 94 U/L (ref 40–150)
ALT: 29 U/L (ref 0–55)
AST: 26 U/L (ref 5–34)
Albumin: 3.1 g/dL — ABNORMAL LOW (ref 3.5–5.0)
Anion Gap: 7 mEq/L (ref 3–11)
BUN: 24 mg/dL (ref 7.0–26.0)
CALCIUM: 9.1 mg/dL (ref 8.4–10.4)
CO2: 25 mEq/L (ref 22–29)
CREATININE: 1.1 mg/dL (ref 0.7–1.3)
Chloride: 105 mEq/L (ref 98–109)
EGFR: 67 mL/min/{1.73_m2} — ABNORMAL LOW (ref >90)
Glucose: 131 mg/dl (ref 70–140)
POTASSIUM: 4.2 meq/L (ref 3.5–5.1)
SODIUM: 137 meq/L (ref 136–145)
Total Bilirubin: 0.65 mg/dL (ref 0.20–1.20)
Total Protein: 6.1 g/dL — ABNORMAL LOW (ref 6.4–8.3)

## 2015-01-31 MED ORDER — FAMOTIDINE IN NACL 20-0.9 MG/50ML-% IV SOLN
INTRAVENOUS | Status: AC
  Filled 2015-01-31: qty 50

## 2015-01-31 MED ORDER — FAMOTIDINE IN NACL 20-0.9 MG/50ML-% IV SOLN
20.0000 mg | Freq: Once | INTRAVENOUS | Status: AC
  Administered 2015-01-31: 20 mg via INTRAVENOUS

## 2015-01-31 MED ORDER — CARBOPLATIN CHEMO INJECTION 450 MG/45ML
220.4000 mg | Freq: Once | INTRAVENOUS | Status: AC
  Administered 2015-01-31: 220 mg via INTRAVENOUS
  Filled 2015-01-31: qty 22

## 2015-01-31 MED ORDER — SODIUM CHLORIDE 0.9 % IJ SOLN
10.0000 mL | INTRAMUSCULAR | Status: DC | PRN
  Administered 2015-01-31: 10 mL
  Filled 2015-01-31: qty 10

## 2015-01-31 MED ORDER — ONDANSETRON 16 MG/50ML IVPB (CHCC)
16.0000 mg | Freq: Once | INTRAVENOUS | Status: AC
  Administered 2015-01-31: 16 mg via INTRAVENOUS

## 2015-01-31 MED ORDER — DEXAMETHASONE SODIUM PHOSPHATE 20 MG/5ML IJ SOLN
20.0000 mg | Freq: Once | INTRAMUSCULAR | Status: AC
  Administered 2015-01-31: 20 mg via INTRAVENOUS

## 2015-01-31 MED ORDER — HEPARIN SOD (PORK) LOCK FLUSH 100 UNIT/ML IV SOLN
500.0000 [IU] | Freq: Once | INTRAVENOUS | Status: AC | PRN
  Administered 2015-01-31: 500 [IU]
  Filled 2015-01-31: qty 5

## 2015-01-31 MED ORDER — DIPHENHYDRAMINE HCL 50 MG/ML IJ SOLN
INTRAMUSCULAR | Status: AC
  Filled 2015-01-31: qty 1

## 2015-01-31 MED ORDER — SODIUM CHLORIDE 0.9 % IV SOLN
Freq: Once | INTRAVENOUS | Status: AC
  Administered 2015-01-31: 13:00:00 via INTRAVENOUS

## 2015-01-31 MED ORDER — ONDANSETRON 16 MG/50ML IVPB (CHCC)
INTRAVENOUS | Status: AC
  Filled 2015-01-31: qty 16

## 2015-01-31 MED ORDER — PACLITAXEL CHEMO INJECTION 300 MG/50ML
45.0000 mg/m2 | Freq: Once | INTRAVENOUS | Status: AC
  Administered 2015-01-31: 102 mg via INTRAVENOUS
  Filled 2015-01-31: qty 17

## 2015-01-31 MED ORDER — CARVEDILOL 6.25 MG PO TABS
6.2500 mg | ORAL_TABLET | Freq: Two times a day (BID) | ORAL | Status: DC
Start: 2015-01-31 — End: 2015-03-21

## 2015-01-31 MED ORDER — DIPHENHYDRAMINE HCL 50 MG/ML IJ SOLN
50.0000 mg | Freq: Once | INTRAMUSCULAR | Status: AC
  Administered 2015-01-31: 50 mg via INTRAVENOUS

## 2015-01-31 MED ORDER — DEXAMETHASONE SODIUM PHOSPHATE 20 MG/5ML IJ SOLN
INTRAMUSCULAR | Status: AC
  Filled 2015-01-31: qty 5

## 2015-01-31 MED ORDER — SODIUM CHLORIDE 0.9 % IJ SOLN
10.0000 mL | INTRAMUSCULAR | Status: DC | PRN
  Administered 2015-01-31: 10 mL via INTRAVENOUS
  Filled 2015-01-31: qty 10

## 2015-01-31 NOTE — Patient Instructions (Signed)

## 2015-01-31 NOTE — Telephone Encounter (Signed)
Will do.         Per Dr Bess Kinds - can you contact him and ask him to stop Lisinopril and reduce carvedilol to 6.25 mg BID? thx    ----- Message -----     From: Lora Paula, MD     Sent: 01/28/2015  4:02 PM      To: Colon Branch, MD, Blane Ohara, MD, Legrand Como,    He has had 5 weeks of chest radiation for lung cancer and is running a little hypotensive. Can you review lisinopril and carvedilol and suggest any possible dose adjustments.    Matt

## 2015-01-31 NOTE — Patient Instructions (Addendum)
[facility] Discharge Instructions for Patients Receiving Chemotherapy  Today you received the following chemotherapy agents Paclitaxel/Carboplatin.   To help prevent nausea and vomiting after your treatment, we encourage you to take your nausea medication as directed.    If you develop nausea and vomiting that is not controlled by your nausea medication, call the clinic.   BELOW ARE SYMPTOMS THAT SHOULD BE REPORTED IMMEDIATELY:  *FEVER GREATER THAN 100.5 F  *CHILLS WITH OR WITHOUT FEVER  NAUSEA AND VOMITING THAT IS NOT CONTROLLED WITH YOUR NAUSEA MEDICATION  *UNUSUAL SHORTNESS OF BREATH  *UNUSUAL BRUISING OR BLEEDING  TENDERNESS IN MOUTH AND THROAT WITH OR WITHOUT PRESENCE OF ULCERS  *URINARY PROBLEMS  *BOWEL PROBLEMS  UNUSUAL RASH Items with * indicate a potential emergency and should be followed up as soon as possible.  Feel free to call the clinic you have any questions or concerns. The clinic phone number is [phone].    

## 2015-01-31 NOTE — Telephone Encounter (Signed)
I spoke with the pt's wife and made her aware of medication changes per Dr [name].  The pt will finish using up his current supply of Carvedilol 12.5mg  by taking one-half tablet twice a day.  A new Rx was sent to the pharmacy for 6.25mg  tablet.

## 2015-02-01 ENCOUNTER — Ambulatory Visit
Admission: RE | Admit: 2015-02-01 | Discharge: 2015-02-01 | Disposition: A | Discharge status: Home / Self Care | Payer: BLUE CROSS/BLUE SHIELD | Source: Ambulatory Visit | Attending: Radiation Oncology | Admitting: Radiation Oncology

## 2015-02-01 DIAGNOSIS — C3490 Malignant neoplasm of unspecified part of unspecified bronchus or lung: Secondary | ICD-10-CM | POA: Diagnosis not present

## 2015-02-02 ENCOUNTER — Ambulatory Visit
Admission: RE | Admit: 2015-02-02 | Discharge: 2015-02-02 | Disposition: A | Discharge status: Home / Self Care | Payer: BLUE CROSS/BLUE SHIELD | Source: Ambulatory Visit | Attending: Radiation Oncology | Admitting: Radiation Oncology

## 2015-02-02 DIAGNOSIS — C3490 Malignant neoplasm of unspecified part of unspecified bronchus or lung: Secondary | ICD-10-CM | POA: Diagnosis not present

## 2015-02-03 ENCOUNTER — Ambulatory Visit
Admission: RE | Admit: 2015-02-03 | Discharge: 2015-02-03 | Disposition: A | Discharge status: Home / Self Care | Payer: BLUE CROSS/BLUE SHIELD | Source: Ambulatory Visit | Attending: Radiation Oncology | Admitting: Radiation Oncology

## 2015-02-03 ENCOUNTER — Telehealth: Payer: Self-pay | Admitting: *Deleted

## 2015-02-03 DIAGNOSIS — C3490 Malignant neoplasm of unspecified part of unspecified bronchus or lung: Secondary | ICD-10-CM | POA: Diagnosis not present

## 2015-02-03 NOTE — Telephone Encounter (Signed)
Patient's daughter called regarding appts for Monday. I have given her the times. Also that it will be the last treamtent.

## 2015-02-04 ENCOUNTER — Ambulatory Visit
Admission: RE | Admit: 2015-02-04 | Discharge: 2015-02-04 | Disposition: A | Discharge status: Home / Self Care | Payer: BLUE CROSS/BLUE SHIELD | Source: Ambulatory Visit | Attending: Radiation Oncology | Admitting: Radiation Oncology

## 2015-02-04 ENCOUNTER — Encounter: Payer: Self-pay | Admitting: Radiation Oncology

## 2015-02-04 VITALS — BP 122/79 | HR 92 | Resp 18 | Wt 245.9 lb

## 2015-02-04 DIAGNOSIS — C3411 Malignant neoplasm of upper lobe, right bronchus or lung: Secondary | ICD-10-CM

## 2015-02-04 DIAGNOSIS — C3490 Malignant neoplasm of unspecified part of unspecified bronchus or lung: Secondary | ICD-10-CM | POA: Diagnosis not present

## 2015-02-04 NOTE — Progress Notes (Signed)
Reports sore throat continues. Denies difficulty swallowing. Hoarseness continues. Skin of treatment field without hyperpigmentation or desquamation. Continues to use radiaplex bid as directed. Reports dry cough is less frequent with the aid of cough medication. Weight and vitals stable.

## 2015-02-04 NOTE — Progress Notes (Signed)
Department of Radiation Oncology  Phone:  [phone] Fax:        [phone]  Weekly Treatment Note    Name: Alejandro [name] Date: 02/04/2015 MRN: [id number] DOB: Apr 04, 1946   Current dose: 60 Gy  Current fraction: 30   MEDICATIONS: Current Outpatient Prescriptions  Medication Sig Dispense Refill  . aspirin 81 MG tablet Take 81 mg by mouth daily.    . carvedilol (COREG) 6.25 MG tablet Take 1 tablet (6.25 mg total) by mouth 2 (two) times daily with a meal. 60 tablet 6  . FLUoxetine (PROZAC) 20 MG tablet Take 1 tablet (20 mg total) by mouth daily. 30 tablet 6  . furosemide (LASIX) 40 MG tablet Take one tablet by mouth one time daily 30 tablet 5  . hyaluronate sodium (RADIAPLEXRX) GEL Apply 1 application topically 2 (two) times daily.    Marland Kitchen HYDROcodone-acetaminophen (NORCO/VICODIN) 5-325 MG per tablet Take 1 tablet by mouth every 6 (six) hours as needed (cough). 60 tablet 0  . levETIRAcetam (KEPPRA) 500 MG tablet Take 1 tablet (500 mg total) by mouth 2 (two) times daily. 60 tablet 6  . lidocaine-prilocaine (EMLA) cream Apply 1 application topically as needed. 30 g 2  . Multiple Vitamin (MULTIVITAMIN WITH MINERALS) TABS tablet Take 1 tablet by mouth daily.    . pantoprazole (PROTONIX) 40 MG tablet Take 1 tablet (40 mg total) by mouth at bedtime. DUE FOR APPT WITH DR. PAZ. [id number]. 30 tablet 2  . potassium chloride (K-DUR,KLOR-CON) 10 MEQ tablet TAKE ONE TABLET BY MOUTH ONE TIME DAILY  30 tablet 6  . PRESCRIPTION MEDICATION Last dose 01/03/15. At the cancer center. Dr Julien Nordmann    . prochlorperazine (COMPAZINE) 10 MG tablet Take 1 tablet (10 mg total) by mouth every 6 (six) hours as needed for nausea or vomiting. 30 tablet 1  . simvastatin (ZOCOR) 40 MG tablet Take 1 tablet (40 mg total) by mouth every evening. DUE FOR APPT WITH DR. PAZ. [phone]. 30 tablet 2   No current facility-administered medications for this encounter.     ALLERGIES: Review of patient's allergies indicates no  known allergies.   LABORATORY DATA:  Lab Results  Component Value Date   WBC 2.6* 01/31/2015   HGB 9.8* 01/31/2015   HCT 29.2* 01/31/2015   MCV 84.7 01/31/2015   PLT 143 01/31/2015   Lab Results  Component Value Date   NA 137 01/31/2015   K 4.2 01/31/2015   CL 103 01/07/2015   CO2 25 01/31/2015   Lab Results  Component Value Date   ALT 29 01/31/2015   AST 26 01/31/2015   ALKPHOS 94 01/31/2015   BILITOT 0.65 01/31/2015     NARRATIVE: Alejandro [name] was seen today for weekly treatment management. The chart was checked and the patient's films were reviewed.  Reports sore throat continues. Denies difficulty swallowing. Hoarseness continues. Skin of treatment field without hyperpigmentation or desquamation. Continues to use radiaplex bid as directed. Reports dry cough is less frequent with the aid of cough medication. Weight and vitals stable.   The patient really appears to be doing well overall. He does not have substantial complaints of esophagitis at this time. We discussed this in some detail.  PHYSICAL EXAMINATION: weight is 245 lb 14.4 oz (111.54 kg). His blood pressure is 122/79 and his pulse is 92. His respiration is 18 and oxygen saturation is 100%.        ASSESSMENT: The patient is doing satisfactorily with treatment.  PLAN: We will continue  with the patient's radiation treatment as planned.

## 2015-02-07 ENCOUNTER — Other Ambulatory Visit: Payer: BLUE CROSS/BLUE SHIELD

## 2015-02-07 ENCOUNTER — Ambulatory Visit (HOSPITAL_BASED_OUTPATIENT_CLINIC_OR_DEPARTMENT_OTHER): Payer: BLUE CROSS/BLUE SHIELD | Admitting: Physician Assistant

## 2015-02-07 ENCOUNTER — Ambulatory Visit
Admission: RE | Admit: 2015-02-07 | Discharge: 2015-02-07 | Disposition: A | Discharge status: Home / Self Care | Payer: BLUE CROSS/BLUE SHIELD | Source: Ambulatory Visit | Attending: Radiation Oncology | Admitting: Radiation Oncology

## 2015-02-07 ENCOUNTER — Ambulatory Visit: Payer: BLUE CROSS/BLUE SHIELD

## 2015-02-07 ENCOUNTER — Ambulatory Visit: Payer: BLUE CROSS/BLUE SHIELD | Admitting: Oncology

## 2015-02-07 ENCOUNTER — Ambulatory Visit (HOSPITAL_BASED_OUTPATIENT_CLINIC_OR_DEPARTMENT_OTHER): Payer: BLUE CROSS/BLUE SHIELD

## 2015-02-07 ENCOUNTER — Encounter: Payer: Self-pay | Admitting: Physician Assistant

## 2015-02-07 ENCOUNTER — Telehealth: Payer: Self-pay | Admitting: Physician Assistant

## 2015-02-07 ENCOUNTER — Other Ambulatory Visit (HOSPITAL_BASED_OUTPATIENT_CLINIC_OR_DEPARTMENT_OTHER): Payer: BLUE CROSS/BLUE SHIELD

## 2015-02-07 VITALS — BP 101/65 | HR 82 | Temp 97.7°F | Resp 18 | Ht 68.0 in | Wt 246.2 lb

## 2015-02-07 DIAGNOSIS — C3411 Malignant neoplasm of upper lobe, right bronchus or lung: Secondary | ICD-10-CM

## 2015-02-07 DIAGNOSIS — C3490 Malignant neoplasm of unspecified part of unspecified bronchus or lung: Secondary | ICD-10-CM | POA: Diagnosis not present

## 2015-02-07 DIAGNOSIS — C3491 Malignant neoplasm of unspecified part of right bronchus or lung: Secondary | ICD-10-CM

## 2015-02-07 DIAGNOSIS — R911 Solitary pulmonary nodule: Secondary | ICD-10-CM

## 2015-02-07 DIAGNOSIS — Z5111 Encounter for antineoplastic chemotherapy: Secondary | ICD-10-CM

## 2015-02-07 DIAGNOSIS — Z95828 Presence of other vascular implants and grafts: Secondary | ICD-10-CM

## 2015-02-07 LAB — COMPREHENSIVE METABOLIC PANEL (CC13)
ALBUMIN: 3.1 g/dL — AB (ref 3.5–5.0)
ALK PHOS: 93 U/L (ref 40–150)
ALT: 34 U/L (ref 0–55)
AST: 32 U/L (ref 5–34)
Anion Gap: 8 mEq/L (ref 3–11)
BUN: 18.9 mg/dL (ref 7.0–26.0)
CALCIUM: 8.9 mg/dL (ref 8.4–10.4)
CHLORIDE: 106 meq/L (ref 98–109)
CO2: 24 mEq/L (ref 22–29)
Creatinine: 1 mg/dL (ref 0.7–1.3)
EGFR: 75 mL/min/{1.73_m2} — AB (ref >90)
GLUCOSE: 117 mg/dL (ref 70–140)
POTASSIUM: 4.2 meq/L (ref 3.5–5.1)
Sodium: 138 mEq/L (ref 136–145)
Total Bilirubin: 0.73 mg/dL (ref 0.20–1.20)
Total Protein: 5.9 g/dL — ABNORMAL LOW (ref 6.4–8.3)

## 2015-02-07 LAB — CBC WITH DIFFERENTIAL/PLATELET
BASO%: 0 % (ref 0.0–2.0)
BASOS ABS: 0 10*3/uL (ref 0.0–0.1)
EOS%: 0.3 % (ref 0.0–7.0)
Eosinophils Absolute: 0 10*3/uL (ref 0.0–0.5)
HCT: 27.3 % — ABNORMAL LOW (ref 38.4–49.9)
HGB: 9.5 g/dL — ABNORMAL LOW (ref 13.0–17.1)
LYMPH#: 0.4 10*3/uL — AB (ref 0.9–3.3)
LYMPH%: 12.1 % — ABNORMAL LOW (ref 14.0–49.0)
MCH: 29.1 pg (ref 27.2–33.4)
MCHC: 34.8 g/dL (ref 32.0–36.0)
MCV: 83.7 fL (ref 79.3–98.0)
MONO#: 0.3 10*3/uL (ref 0.1–0.9)
MONO%: 9.3 % (ref 0.0–14.0)
NEUT%: 78.3 % — ABNORMAL HIGH (ref 39.0–75.0)
NEUTROS ABS: 2.9 10*3/uL (ref 1.5–6.5)
Platelets: 146 10*3/uL (ref 140–400)
RBC: 3.26 10*6/uL — ABNORMAL LOW (ref 4.20–5.82)
RDW: 17.1 % — ABNORMAL HIGH (ref 11.0–14.6)
WBC: 3.6 10*3/uL — ABNORMAL LOW (ref 4.0–10.3)

## 2015-02-07 MED ORDER — SODIUM CHLORIDE 0.9 % IV SOLN
Freq: Once | INTRAVENOUS | Status: AC
  Administered 2015-02-07: 13:00:00 via INTRAVENOUS

## 2015-02-07 MED ORDER — DIPHENHYDRAMINE HCL 50 MG/ML IJ SOLN
INTRAMUSCULAR | Status: AC
  Filled 2015-02-07: qty 1

## 2015-02-07 MED ORDER — DIPHENHYDRAMINE HCL 50 MG/ML IJ SOLN
50.0000 mg | Freq: Once | INTRAMUSCULAR | Status: AC
  Administered 2015-02-07: 50 mg via INTRAVENOUS

## 2015-02-07 MED ORDER — SODIUM CHLORIDE 0.9 % IJ SOLN
10.0000 mL | INTRAMUSCULAR | Status: DC | PRN
  Administered 2015-02-07: 10 mL
  Filled 2015-02-07: qty 10

## 2015-02-07 MED ORDER — ONDANSETRON 16 MG/50ML IVPB (CHCC): 16.0000 mg | Freq: Once | INTRAVENOUS | Status: DC

## 2015-02-07 MED ORDER — SODIUM CHLORIDE 0.9 % IJ SOLN
10.0000 mL | INTRAMUSCULAR | Status: DC | PRN
  Administered 2015-02-07: 10 mL via INTRAVENOUS
  Filled 2015-02-07: qty 10

## 2015-02-07 MED ORDER — SODIUM CHLORIDE 0.9 % IV SOLN
Freq: Once | INTRAVENOUS | Status: AC
  Administered 2015-02-07: 14:00:00 via INTRAVENOUS
  Filled 2015-02-07: qty 8

## 2015-02-07 MED ORDER — FAMOTIDINE IN NACL 20-0.9 MG/50ML-% IV SOLN
20.0000 mg | Freq: Once | INTRAVENOUS | Status: AC
  Administered 2015-02-07: 20 mg via INTRAVENOUS

## 2015-02-07 MED ORDER — DEXAMETHASONE SODIUM PHOSPHATE 20 MG/5ML IJ SOLN: 20.0000 mg | Freq: Once | INTRAMUSCULAR | Status: DC

## 2015-02-07 MED ORDER — FAMOTIDINE IN NACL 20-0.9 MG/50ML-% IV SOLN
INTRAVENOUS | Status: AC
  Filled 2015-02-07: qty 50

## 2015-02-07 MED ORDER — PACLITAXEL CHEMO INJECTION 300 MG/50ML
45.0000 mg/m2 | Freq: Once | INTRAVENOUS | Status: AC
  Administered 2015-02-07: 102 mg via INTRAVENOUS
  Filled 2015-02-07: qty 17

## 2015-02-07 MED ORDER — HEPARIN SOD (PORK) LOCK FLUSH 100 UNIT/ML IV SOLN
500.0000 [IU] | Freq: Once | INTRAVENOUS | Status: AC | PRN
  Administered 2015-02-07: 500 [IU]
  Filled 2015-02-07: qty 5

## 2015-02-07 MED ORDER — SODIUM CHLORIDE 0.9 % IV SOLN
220.0000 mg | Freq: Once | INTRAVENOUS | Status: AC
  Administered 2015-02-07: 220 mg via INTRAVENOUS
  Filled 2015-02-07: qty 22

## 2015-02-07 NOTE — Patient Instructions (Signed)
Complete your course of concurrent chemoradiation as scheduled Follow-up in a proximally 5 weeks with restaging CT scan of your chest to reevaluate your disease

## 2015-02-07 NOTE — Patient Instructions (Signed)

## 2015-02-07 NOTE — Progress Notes (Addendum)
No images are attached to the encounter. No scans are attached to the encounter. No scans are attached to the encounter. [place], [place] [id number]  DIAGNOSIS: Non-small cell carcinoma of lung, stage 3   Staging form: Lung, AJCC 7th Edition     Clinical stage from 12/16/2014: Stage IIIA (T1b, N2, M0) - Signed by Curt Bears, MD on 12/18/2014       Staging comments: Squamous cell carcinoma  PRIOR THERAPY: none  CURRENT THERAPY: Concurrent chemoradiation with weekly carboplatin for an AUC of 2 and paclitaxel 45 g read square concurrent with radiation for 6-7 weeks.. Status post 5 weeks of treatment  INTERVAL HISTORY: Alejandro [name] 69 y.o. male returns for scheduled regular symptom management visit for followup of his recently diagnosed stage IIIa non-small cell lung cancer, squamous cell carcinoma. He is currently undergoing a course of concurrent chemoradiation, status post 5 weeks of treatment. He is scheduled for his last fraction of radiation on 02/09/2015. Overall is tolerating his course of concurrent chemotherapy relatively well with the exception of beginning have some mild difficulty with swallowing. He does have some issues with constipation a few days after chemotherapy followed by a few episodes of diarrhea. He denies any blood in his stool. These episodes are self limiting and he does not find them problematic.  He remains short of breath and hoarse.These symptoms were present prior to his starting his course of concurrent chemoradiation. He tolerated his first week of treatment without difficulty. She denies any nausea, vomiting, diarrhea or constipation. He said no fever, chills cough or hemoptysis. Denies significant weight loss or night sweats. He presents to proceed with week #6 of treatment.   MEDICAL HISTORY: Past Medical History  Diagnosis Date  . Systolic heart failure   . CAD  (coronary artery disease)     s/p CABGx4 on 12/24/2008  . Dyslipidemia   . HTN (hypertension)   . Atrial fibrillation   . Tobacco abuse   . Alcohol abuse   . H/O hiatal hernia   . Myocardial infarction   . Dysrhythmia     atrial fib  . Stroke 5/15  . CHF (congestive heart failure)   . Shortness of breath     occ  . GERD (gastroesophageal reflux disease)   . Cancer     around nose.  New dx of lung cancer  . S/P chemotherapy, time since 4-12 weeks     ALLERGIES:  has No Known Allergies.  MEDICATIONS:  Current Outpatient Prescriptions  Medication Sig Dispense Refill  . aspirin 81 MG tablet Take 81 mg by mouth daily.    . carvedilol (COREG) 6.25 MG tablet Take 1 tablet (6.25 mg total) by mouth 2 (two) times daily with a meal. 60 tablet 6  . FLUoxetine (PROZAC) 20 MG tablet Take 1 tablet (20 mg total) by mouth daily. 30 tablet 6  . furosemide (LASIX) 40 MG tablet Take one tablet by mouth one time daily 30 tablet 5  . hyaluronate sodium (RADIAPLEXRX) GEL Apply 1 application topically 2 (two) times daily.    Marland Kitchen levETIRAcetam (KEPPRA) 500 MG tablet Take 1 tablet (500 mg total) by mouth 2 (two) times daily. 60 tablet 6  . lidocaine-prilocaine (EMLA) cream Apply 1 application topically as needed. 30 g 2  . Multiple Vitamin (MULTIVITAMIN WITH MINERALS) TABS tablet Take 1 tablet by mouth daily.    . pantoprazole (PROTONIX)  40 MG tablet Take 1 tablet (40 mg total) by mouth at bedtime. DUE FOR APPT WITH DR. PAZ. [id number]. 30 tablet 2  . PRESCRIPTION MEDICATION Last dose 01/03/15. At the cancer center. Dr Julien Nordmann    . simvastatin (ZOCOR) 40 MG tablet Take 1 tablet (40 mg total) by mouth every evening. DUE FOR APPT WITH DR. PAZ. [id number]. 30 tablet 2  . HYDROcodone-acetaminophen (NORCO/VICODIN) 5-325 MG per tablet Take 1 tablet by mouth every 6 (six) hours as needed (cough). (Patient not taking: Reported on 02/07/2015) 60 tablet 0  . potassium chloride (K-DUR,KLOR-CON) 10 MEQ tablet TAKE ONE TABLET  BY MOUTH ONE TIME DAILY  (Patient not taking: Reported on 02/07/2015) 30 tablet 6  . prochlorperazine (COMPAZINE) 10 MG tablet Take 1 tablet (10 mg total) by mouth every 6 (six) hours as needed for nausea or vomiting. (Patient not taking: Reported on 02/07/2015) 30 tablet 1   No current facility-administered medications for this visit.    SURGICAL HISTORY:  Past Surgical History  Procedure Laterality Date  . Umbilical hernia repair  2010  . Hernia repair  2012  . Ventral hernia repair N/A 01/22/2013    Procedure: HERNIA REPAIR VENTRAL ADULT;  Surgeon: [name], MD;  Location: [place];  Service: General;  Laterality: N/A;  . Application of a-cell of chest/abdomen N/A 01/22/2013    Procedure: APPLICATION OF A-CELL OF CHEST/ABDOMEN;  Surgeon: [name], MD;  Location: [place];  Service: General;  Laterality: N/A;  . Cystoscopy N/A 01/22/2013    Procedure: Consuela Mimes;  Surgeon: [name], MD;  Location: [place];  Service: Urology;  Laterality: N/A;  Cystoscopy with balloon dilation. Insertion of coude catheter.  . Craniotomy Right 04/19/2014    Procedure: CRANIOTOMY HEMATOMA EVACUATION SUBDURAL;  Surgeon: [name], MD;  Location: [place] [facility];  Service: Neurosurgery;  Laterality: Right;  right  . Craniotomy Right 04/23/2014    Procedure: Craniotomy for Intracerebral Hemorrhage;  Surgeon: [name], MD;  Location: [place] [facility];  Service: Neurosurgery;  Laterality: Right;  . 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 Coronary artery bypass graft  12/24/2008    4 vessel  . Coronary artery bypass graft  2012?  Marland Kitchen Craniotomy N/A 09/13/2014    Procedure: CRANIOTOMY BONE FLAP/PROSTHETIC PLATE;  Surgeon: [name], MD;  Location: [place] [facility];  Service: Neurosurgery;  Laterality: N/A;  . Video bronchoscopy with endobronchial ultrasound N/A  11/09/2014    Procedure: VIDEO BRONCHOSCOPY WITH ENDOBRONCHIAL ULTRASOUND;  Surgeon: Collene Gobble, MD;  Location: [place];  Service: Thoracic;  Laterality: N/A;    REVIEW OF SYSTEMS:  Review of Systems  Constitutional: Negative for fever, chills, weight loss, malaise/fatigue and diaphoresis.  HENT: Negative for congestion, ear discharge, ear pain, hearing loss, nosebleeds, sore throat and tinnitus.   Eyes: Negative for blurred vision, double vision, photophobia, pain, discharge and redness.  Respiratory: Negative for cough, hemoptysis, sputum production, shortness of breath, wheezing and stridor.   Cardiovascular: Negative for chest pain, palpitations, orthopnea, claudication, leg swelling and PND.  Gastrointestinal: Negative for heartburn, nausea, vomiting, abdominal pain, diarrhea, constipation, blood in stool and melena.  Genitourinary: Negative.   Musculoskeletal: Negative.   Skin: Negative.   Neurological: Negative for dizziness, tingling, focal weakness, seizures, weakness and headaches.  Endo/Heme/Allergies: Does not bruise/bleed easily.  Psychiatric/Behavioral:  Negative for depression. The patient is not nervous/anxious and does not have insomnia.      PHYSICAL EXAMINATION: Physical Exam  Constitutional: He is oriented to person, place, and time and well-developed, well-nourished, and in no distress.  HENT:  Head: Normocephalic and atraumatic.  Mouth/Throat: Oropharynx is clear and moist.  Eyes: Pupils are equal, round, and reactive to light.  Neck: Normal range of motion. Neck supple. No JVD present. No tracheal deviation present. No thyromegaly present.  Cardiovascular: Normal rate, regular rhythm, normal heart sounds and intact distal pulses.  Exam reveals no gallop and no friction rub.   No murmur heard. Pulmonary/Chest: Effort normal and breath sounds normal. No respiratory distress. He has no wheezes. He has no rales.  Abdominal: Soft. Bowel sounds are normal. He exhibits  no distension and no mass. There is no tenderness.  Musculoskeletal: Normal range of motion. He exhibits no edema or tenderness.  Lymphadenopathy:    He has no cervical adenopathy.  Neurological: He is alert and oriented to person, place, and time. He has normal reflexes. Gait normal.  Skin: Skin is warm and dry. No rash noted.    ECOG PERFORMANCE STATUS: 1 - Symptomatic but completely ambulatory  Blood pressure 101/65, pulse 82, temperature 97.7 F (36.5 C), temperature source Oral, resp. rate 18, height 5\' 8"  (1.727 m), weight 246 lb 3.2 oz (111.676 kg).  LABORATORY DATA: Lab Results  Component Value Date   WBC 3.6* 02/07/2015   HGB 9.5* 02/07/2015   HCT 27.3* 02/07/2015   MCV 83.7 02/07/2015   PLT 146 02/07/2015      Chemistry      Component Value Date/Time   NA 138 02/07/2015 1049   NA 134* 01/07/2015 0800   K 4.2 02/07/2015 1049   K 4.8 01/07/2015 0800   CL 103 01/07/2015 0800   CO2 24 02/07/2015 1049   CO2 24 01/07/2015 0800   BUN 18.9 02/07/2015 1049   BUN 31* 01/07/2015 0800   CREATININE 1.0 02/07/2015 1049   CREATININE 1.03 01/07/2015 0800   CREATININE 0.99 09/26/2012 1526      Component Value Date/Time   CALCIUM 8.9 02/07/2015 1049   CALCIUM 8.7 01/07/2015 0800   ALKPHOS 93 02/07/2015 1049   ALKPHOS 68 09/23/2014 0922   AST 32 02/07/2015 1049   AST 24 09/23/2014 0922   ALT 34 02/07/2015 1049   ALT 18 09/23/2014 0922   BILITOT 0.73 02/07/2015 1049   BILITOT 0.4 09/23/2014 0922       RADIOGRAPHIC STUDIES:  No results found.   ASSESSMENT/PLAN:  No problem-specific assessment & plan notes found for this encounter.  patient is a pleasant 69 year old Caucasian male recently diagnosed with stage IIIa (T2a, N2, M0) non-small cell lung cancer, squamous cell carcinoma diagnosed December 2015. Presented with right upper lobe lung mass in addition to mediastinal lymphadenopathy. He is currently undergoing a course of concurrent chemoradiation with weekly  chemotherapy in the form of carboplatin for an AUC of 2 and paclitaxel at 45 mg/m. He status post 3 weeks of treatment. Patient was discussed with and also seen by Dr. Julien Nordmann. He will complete his course of concurrent chemoradiation as scheduled. He will follow-up in 4-5 weeks with a restaging CT scan of his chest with contrast to reevaluate his disease.  Awilda Metro E, PA-C 02/07/2015  All questions were answered. The patient knows to call the clinic with any problems, questions or concerns. We can certainly see the patient much sooner if necessary.  ADDENDUM: Hematology/Oncology  Attending: I had a face to face encounter with the patient. I recommended his care plan. This is a very pleasant 69 years old white male with a stage IIIa non-small cell lung cancer currently undergoing concurrent chemoradiation with weekly carboplatin and paclitaxel is status post 5 cycles. The patient is tolerating his treatment fairly well with no significant adverse effects. He denied having any nausea or vomiting, no fever or chills. I recommended for the patient to proceed with cycle #6 today as a scheduled. He would come back for follow-up visit in one month's for reevaluation after repeating CT scan of the chest for restaging of his disease. He was advised to call immediately if he has any concerning symptoms in the interval.  Disclaimer: This note was dictated with voice recognition software. Similar sounding words can inadvertently be transcribed and may be missed upon review. Eilleen Kempf., MD 02/07/2015

## 2015-02-07 NOTE — Patient Instructions (Signed)
[facility] Discharge Instructions for Patients Receiving Chemotherapy  Today you received the following chemotherapy agents Paclitaxel/Carboplatin.   To help prevent nausea and vomiting after your treatment, we encourage you to take your nausea medication as directed.    If you develop nausea and vomiting that is not controlled by your nausea medication, call the clinic.   BELOW ARE SYMPTOMS THAT SHOULD BE REPORTED IMMEDIATELY:  *FEVER GREATER THAN 100.5 F  *CHILLS WITH OR WITHOUT FEVER  NAUSEA AND VOMITING THAT IS NOT CONTROLLED WITH YOUR NAUSEA MEDICATION  *UNUSUAL SHORTNESS OF BREATH  *UNUSUAL BRUISING OR BLEEDING  TENDERNESS IN MOUTH AND THROAT WITH OR WITHOUT PRESENCE OF ULCERS  *URINARY PROBLEMS  *BOWEL PROBLEMS  UNUSUAL RASH Items with * indicate a potential emergency and should be followed up as soon as possible.  Feel free to call the clinic you have any questions or concerns. The clinic phone number is [phone].    

## 2015-02-07 NOTE — Telephone Encounter (Signed)
Pt confirmed labs/ov/flush per 03/07 POF, gave pt AVS... KJ

## 2015-02-08 ENCOUNTER — Ambulatory Visit
Admission: RE | Admit: 2015-02-08 | Discharge: 2015-02-08 | Disposition: A | Discharge status: Home / Self Care | Payer: BLUE CROSS/BLUE SHIELD | Source: Ambulatory Visit | Attending: Radiation Oncology | Admitting: Radiation Oncology

## 2015-02-08 DIAGNOSIS — C3490 Malignant neoplasm of unspecified part of unspecified bronchus or lung: Secondary | ICD-10-CM | POA: Diagnosis not present

## 2015-02-09 ENCOUNTER — Ambulatory Visit
Admission: RE | Admit: 2015-02-09 | Discharge: 2015-02-09 | Disposition: A | Discharge status: Home / Self Care | Payer: BLUE CROSS/BLUE SHIELD | Source: Ambulatory Visit | Attending: Radiation Oncology | Admitting: Radiation Oncology

## 2015-02-09 ENCOUNTER — Encounter: Payer: Self-pay | Admitting: Radiation Oncology

## 2015-02-09 VITALS — BP 112/71 | HR 65 | Resp 18 | Wt 247.5 lb

## 2015-02-09 DIAGNOSIS — C3411 Malignant neoplasm of upper lobe, right bronchus or lung: Secondary | ICD-10-CM

## 2015-02-09 DIAGNOSIS — C3490 Malignant neoplasm of unspecified part of unspecified bronchus or lung: Secondary | ICD-10-CM | POA: Diagnosis not present

## 2015-02-09 NOTE — Progress Notes (Signed)
Department of Radiation Oncology  Phone:  [phone] Fax:        [phone]  Weekly Treatment Note    Name: CALEM COCOZZA Date: 02/09/2015 MRN: [id number] DOB: 1946-07-17   Current dose: 66 Gy  Current fraction: 33   MEDICATIONS: Current Outpatient Prescriptions  Medication Sig Dispense Refill  . aspirin 81 MG tablet Take 81 mg by mouth daily.    . carvedilol (COREG) 6.25 MG tablet Take 1 tablet (6.25 mg total) by mouth 2 (two) times daily with a meal. 60 tablet 6  . FLUoxetine (PROZAC) 20 MG tablet Take 1 tablet (20 mg total) by mouth daily. 30 tablet 6  . furosemide (LASIX) 40 MG tablet Take one tablet by mouth one time daily 30 tablet 5  . hyaluronate sodium (RADIAPLEXRX) GEL Apply 1 application topically 2 (two) times daily.    Marland Kitchen HYDROcodone-acetaminophen (NORCO/VICODIN) 5-325 MG per tablet Take 1 tablet by mouth every 6 (six) hours as needed (cough). 60 tablet 0  . levETIRAcetam (KEPPRA) 500 MG tablet Take 1 tablet (500 mg total) by mouth 2 (two) times daily. 60 tablet 6  . lidocaine-prilocaine (EMLA) cream Apply 1 application topically as needed. 30 g 2  . Multiple Vitamin (MULTIVITAMIN WITH MINERALS) TABS tablet Take 1 tablet by mouth daily.    . pantoprazole (PROTONIX) 40 MG tablet Take 1 tablet (40 mg total) by mouth at bedtime. DUE FOR APPT WITH DR. PAZ. [phone]. 30 tablet 2  . potassium chloride (K-DUR,KLOR-CON) 10 MEQ tablet TAKE ONE TABLET BY MOUTH ONE TIME DAILY  30 tablet 6  . PRESCRIPTION MEDICATION Last dose 01/03/15. At the cancer center. Dr Julien Nordmann    . prochlorperazine (COMPAZINE) 10 MG tablet Take 1 tablet (10 mg total) by mouth every 6 (six) hours as needed for nausea or vomiting. 30 tablet 1  . simvastatin (ZOCOR) 40 MG tablet Take 1 tablet (40 mg total) by mouth every evening. DUE FOR APPT WITH DR. PAZ. [phone]. 30 tablet 2   No current facility-administered medications for this encounter.     ALLERGIES: Review of patient's allergies indicates no  known allergies.   LABORATORY DATA:  Lab Results  Component Value Date   WBC 3.6* 02/07/2015   HGB 9.5* 02/07/2015   HCT 27.3* 02/07/2015   MCV 83.7 02/07/2015   PLT 146 02/07/2015   Lab Results  Component Value Date   NA 138 02/07/2015   K 4.2 02/07/2015   CL 103 01/07/2015   CO2 24 02/07/2015   Lab Results  Component Value Date   ALT 34 02/07/2015   AST 32 02/07/2015   ALKPHOS 93 02/07/2015   BILITOT 0.73 02/07/2015     NARRATIVE: JAQUAY POSTHUMUS was seen today for weekly treatment management. The chart was checked and the patient's films were reviewed.  Reports sore throat continues but, is tolerable. Denies difficulty swallowing. Hoarseness continues but, seems worse today. Faint hyperpigmentation of right upper back noted. Encouraged to continue radiaplex bid for the next two weeks. Reports a dry infrequent cough. Weight and vitals stable. Provided patient with one month follow up appointment card. Encouraged to contact staff with future needs.  The patient denies today any new concerning issues. No significant areas of skin irritation.  PHYSICAL EXAMINATION: weight is 247 lb 8 oz (112.265 kg). His blood pressure is 112/71 and his pulse is 65. His respiration is 18 and oxygen saturation is 96%.        ASSESSMENT: The patient is doing satisfactorily with treatment.  PLAN: Follow-up in one month with Dr. Tammi Klippel.

## 2015-02-09 NOTE — Progress Notes (Signed)
Reports sore throat continues but, is tolerable. Denies difficulty swallowing. Hoarseness continues but, seems worse today. Faint hyperpigmentation of right upper back noted. Encouraged to continue radiaplex bid for the next two weeks. Reports a dry infrequent cough. Weight and vitals stable. Provided patient with one month follow up appointment card. Encouraged to contact staff with future needs.

## 2015-02-09 NOTE — Progress Notes (Signed)
  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  Daily CT image-guidance was used for positioning image guidance.  Narrative: The patient tolerated radiation treatment relatively well.   He had some hoarseness and sore throat.  Plan: The patient has completed radiation treatment. The patient will return to radiation oncology clinic for routine followup in one month. I advised him to call or return sooner if he has any questions or concerns related to his recovery or treatment. ________________________________  Sheral Apley. Tammi Klippel, M.D.

## 2015-02-11 ENCOUNTER — Other Ambulatory Visit: Payer: Self-pay

## 2015-02-11 MED ORDER — LEVETIRACETAM 500 MG PO TABS
500.0000 mg | ORAL_TABLET | Freq: Two times a day (BID) | ORAL | Status: DC
Start: 2015-02-11 — End: 2015-03-21

## 2015-03-07 ENCOUNTER — Other Ambulatory Visit: Payer: Self-pay

## 2015-03-07 ENCOUNTER — Telehealth: Payer: Self-pay | Admitting: Internal Medicine

## 2015-03-07 MED ORDER — PANTOPRAZOLE SODIUM 40 MG PO TBEC: 40.0000 mg | DELAYED_RELEASE_TABLET | Freq: Every day | ORAL | Status: DC

## 2015-03-07 NOTE — Telephone Encounter (Signed)
Caller name: Pamala Hurry Relation to pt: wife Call back number: [phone] [facility]acy: [facility] [address]  Reason for call:   Med refill appointment scheduled for 03/18/15. Patient is out of pantoprazole and is requesting refill. Also, needs carvedilol

## 2015-03-07 NOTE — Telephone Encounter (Signed)
Pantoprazole refilled to [facility] pharmacy. Carvedilol refilled 01/2015 by Dr. [name] for 7 months to [facility] pharmacy.

## 2015-03-10 ENCOUNTER — Other Ambulatory Visit: Payer: Self-pay | Admitting: Internal Medicine

## 2015-03-10 ENCOUNTER — Ambulatory Visit: Payer: BLUE CROSS/BLUE SHIELD | Admitting: Radiation Oncology

## 2015-03-11 ENCOUNTER — Other Ambulatory Visit: Payer: BLUE CROSS/BLUE SHIELD

## 2015-03-11 ENCOUNTER — Ambulatory Visit: Payer: BLUE CROSS/BLUE SHIELD

## 2015-03-11 ENCOUNTER — Encounter (HOSPITAL_COMMUNITY): Payer: Self-pay

## 2015-03-11 ENCOUNTER — Ambulatory Visit (HOSPITAL_COMMUNITY)
Admission: RE | Admit: 2015-03-11 | Discharge: 2015-03-11 | Disposition: A | Discharge status: Home / Self Care | Payer: BLUE CROSS/BLUE SHIELD | Source: Ambulatory Visit | Attending: Physician Assistant | Admitting: Physician Assistant

## 2015-03-11 ENCOUNTER — Other Ambulatory Visit (HOSPITAL_BASED_OUTPATIENT_CLINIC_OR_DEPARTMENT_OTHER): Payer: BLUE CROSS/BLUE SHIELD

## 2015-03-11 VITALS — BP 110/64 | HR 77 | Temp 98.5°F | Resp 18

## 2015-03-11 DIAGNOSIS — Z79899 Other long term (current) drug therapy: Secondary | ICD-10-CM | POA: Insufficient documentation

## 2015-03-11 DIAGNOSIS — Z923 Personal history of irradiation: Secondary | ICD-10-CM | POA: Diagnosis not present

## 2015-03-11 DIAGNOSIS — Z08 Encounter for follow-up examination after completed treatment for malignant neoplasm: Secondary | ICD-10-CM | POA: Insufficient documentation

## 2015-03-11 DIAGNOSIS — J90 Pleural effusion, not elsewhere classified: Secondary | ICD-10-CM | POA: Diagnosis not present

## 2015-03-11 DIAGNOSIS — C3411 Malignant neoplasm of upper lobe, right bronchus or lung: Secondary | ICD-10-CM

## 2015-03-11 DIAGNOSIS — Z95828 Presence of other vascular implants and grafts: Secondary | ICD-10-CM

## 2015-03-11 LAB — COMPREHENSIVE METABOLIC PANEL (CC13)
ALT: 20 U/L (ref 0–55)
AST: 25 U/L (ref 5–34)
Albumin: 3.1 g/dL — ABNORMAL LOW (ref 3.5–5.0)
Alkaline Phosphatase: 85 U/L (ref 40–150)
Anion Gap: 10 mEq/L (ref 3–11)
BILIRUBIN TOTAL: 0.55 mg/dL (ref 0.20–1.20)
BUN: 13.9 mg/dL (ref 7.0–26.0)
CHLORIDE: 106 meq/L (ref 98–109)
CO2: 22 mEq/L (ref 22–29)
Calcium: 8.5 mg/dL (ref 8.4–10.4)
Creatinine: 1 mg/dL (ref 0.7–1.3)
EGFR: 76 mL/min/{1.73_m2} — AB (ref >90)
GLUCOSE: 154 mg/dL — AB (ref 70–140)
Potassium: 3.9 mEq/L (ref 3.5–5.1)
Sodium: 138 mEq/L (ref 136–145)
Total Protein: 6.2 g/dL — ABNORMAL LOW (ref 6.4–8.3)

## 2015-03-11 LAB — CBC WITH DIFFERENTIAL/PLATELET
BASO%: 0.4 % (ref 0.0–2.0)
Basophils Absolute: 0 10*3/uL (ref 0.0–0.1)
EOS%: 1.5 % (ref 0.0–7.0)
Eosinophils Absolute: 0.1 10*3/uL (ref 0.0–0.5)
HCT: 28.6 % — ABNORMAL LOW (ref 38.4–49.9)
HEMOGLOBIN: 9.7 g/dL — AB (ref 13.0–17.1)
LYMPH#: 0.5 10*3/uL — AB (ref 0.9–3.3)
LYMPH%: 12.7 % — ABNORMAL LOW (ref 14.0–49.0)
MCH: 31.8 pg (ref 27.2–33.4)
MCHC: 33.7 g/dL (ref 32.0–36.0)
MCV: 94.2 fL (ref 79.3–98.0)
MONO#: 0.4 10*3/uL (ref 0.1–0.9)
MONO%: 9.2 % (ref 0.0–14.0)
NEUT#: 3.2 10*3/uL (ref 1.5–6.5)
NEUT%: 76.2 % — ABNORMAL HIGH (ref 39.0–75.0)
Platelets: 175 10*3/uL (ref 140–400)
RBC: 3.04 10*6/uL — ABNORMAL LOW (ref 4.20–5.82)
RDW: 23.8 % — AB (ref 11.0–14.6)
WBC: 4.2 10*3/uL (ref 4.0–10.3)

## 2015-03-11 MED ORDER — SODIUM CHLORIDE 0.9 % IJ SOLN
10.0000 mL | INTRAMUSCULAR | Status: DC | PRN
  Administered 2015-03-11: 10 mL via INTRAVENOUS
  Filled 2015-03-11: qty 10

## 2015-03-11 MED ORDER — IOHEXOL 300 MG/ML  SOLN
80.0000 mL | Freq: Once | INTRAMUSCULAR | Status: AC | PRN
  Administered 2015-03-11: 80 mL via INTRAVENOUS

## 2015-03-11 NOTE — Patient Instructions (Signed)
Pt to come back to [facility] flush room if port needle is not deaccessed after CT scan today,  if not otherwise directed.    Implanted [facility] Guide An implanted port is a type of central line that is placed under the skin. Central lines are used to provide IV access when treatment or nutrition needs to be given through a person's veins. Implanted ports are used for long-term IV access. An implanted port may be placed because:   You need IV medicine that would be irritating to the small veins in your hands or arms.   You need long-term IV medicines, such as antibiotics.   You need IV nutrition for a long period.   You need frequent blood draws for lab tests.   You need dialysis.  Implanted ports are usually placed in the chest area, but they can also be placed in the upper arm, the abdomen, or the leg. An implanted port has two main parts:   Reservoir. The reservoir is round and will appear as a small, raised area under your skin. The reservoir is the part where a needle is inserted to give medicines or draw blood.   Catheter. The catheter is a thin, flexible tube that extends from the reservoir. The catheter is placed into a large vein. Medicine that is inserted into the reservoir goes into the catheter and then into the vein.  HOW WILL I CARE FOR MY INCISION SITE? Do not get the incision site wet. Bathe or shower as directed by your health care provider.  HOW IS MY PORT ACCESSED? Special steps must be taken to access the port:   Before the port is accessed, a numbing cream can be placed on the skin. This helps numb the skin over the port site.   Your health care provider uses a sterile technique to access the port.  Your health care provider must put on a mask and sterile gloves.  The skin over your port is cleaned carefully with an antiseptic and allowed to dry.  The port is gently pinched between sterile gloves, and a needle is inserted into the port.  Only  "non-coring" port needles should be used to access the port. Once the port is accessed, a blood return should be checked. This helps ensure that the port is in the vein and is not clogged.   If your port needs to remain accessed for a constant infusion, a clear (transparent) bandage will be placed over the needle site. The bandage and needle will need to be changed every week, or as directed by your health care provider.   Keep the bandage covering the needle clean and dry. Do not get it wet. Follow your health care provider's instructions on how to take a shower or bath while the port is accessed.   If your port does not need to stay accessed, no bandage is needed over the port.  WHAT IS FLUSHING? Flushing helps keep the port from getting clogged. Follow your health care provider's instructions on how and when to flush the port. Ports are usually flushed with saline solution or a medicine called heparin. The need for flushing will depend on how the port is used.   If the port is used for intermittent medicines or blood draws, the port will need to be flushed:   After medicines have been given.   After blood has been drawn.   As part of routine maintenance.   If a constant infusion is running, the  port may not need to be flushed.  HOW LONG WILL MY PORT STAY IMPLANTED? The port can stay in for as long as your health care provider thinks it is needed. When it is time for the port to come out, surgery will be done to remove it. The procedure is similar to the one performed when the port was put in.  WHEN SHOULD I SEEK IMMEDIATE MEDICAL CARE? When you have an implanted port, you should seek immediate medical care if:   You notice a bad smell coming from the incision site.   You have swelling, redness, or drainage at the incision site.   You have more swelling or pain at the port site or the surrounding area.   You have a fever that is not controlled with medicine. Document  Released: 11/19/2005 Document Revised: 09/09/2013 Document Reviewed: 07/27/2013 [facility] Patient Information 2015 [place], [place]. This information is not intended to replace advice given to you by your health care provider. Make sure you discuss any questions you have with your health care provider.

## 2015-03-14 ENCOUNTER — Encounter: Payer: Self-pay | Admitting: Internal Medicine

## 2015-03-14 ENCOUNTER — Telehealth: Payer: Self-pay | Admitting: Internal Medicine

## 2015-03-14 ENCOUNTER — Ambulatory Visit (HOSPITAL_BASED_OUTPATIENT_CLINIC_OR_DEPARTMENT_OTHER): Payer: BLUE CROSS/BLUE SHIELD | Admitting: Internal Medicine

## 2015-03-14 ENCOUNTER — Encounter: Payer: Self-pay | Admitting: *Deleted

## 2015-03-14 VITALS — BP 128/79 | HR 113 | Temp 97.7°F | Resp 22 | Ht 68.0 in | Wt 251.9 lb

## 2015-03-14 DIAGNOSIS — D630 Anemia in neoplastic disease: Secondary | ICD-10-CM | POA: Diagnosis not present

## 2015-03-14 DIAGNOSIS — R0609 Other forms of dyspnea: Secondary | ICD-10-CM

## 2015-03-14 DIAGNOSIS — C3411 Malignant neoplasm of upper lobe, right bronchus or lung: Secondary | ICD-10-CM | POA: Diagnosis not present

## 2015-03-14 DIAGNOSIS — C3492 Malignant neoplasm of unspecified part of left bronchus or lung: Secondary | ICD-10-CM

## 2015-03-14 NOTE — Progress Notes (Signed)
[place] Telephone:([phone]   Fax:([phone]) [place], [place] [address] [place] [address]  DIAGNOSIS: Non-small cell carcinoma of lung, stage 3  Staging form: Lung, AJCC 7th Edition  Clinical stage from 12/16/2014: Stage IIIA (T1b, N2, M0) - Signed by Curt Bears, MD on 12/18/2014  Staging comments: Squamous cell carcinoma  PRIOR THERAPY: Concurrent chemoradiation with weekly carboplatin for an AUC of 2 and paclitaxel 45 mg/m2.  CURRENT THERAPY:   INTERVAL HISTORY: Alejandro [name] 69 y.o. male returns to the clinic today for follow-up visit accompanied by his wife and daughter. The patient tolerated the previous course of his concurrent chemoradiation fairly well with no significant adverse effects except for mild fatigue secondary to anemia of neoplastic disease. The patient denied having any significant chest pain but continues to have shortness breath with exertion was no cough or hemoptysis. He has no significant weight loss or night sweats. He has no nausea or vomiting, no fever or chills. The patient had repeat CT scan of the chest performed recently and he is here for evaluation and discussion of his scan results.  MEDICAL HISTORY: Past Medical History  Diagnosis Date  . Systolic heart failure   . 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 Dyslipidemia   . HTN (hypertension)   . Atrial fibrillation   . Tobacco abuse   . Alcohol abuse   . H/O hiatal hernia   . Myocardial infarction   . Dysrhythmia     atrial fib  . Stroke 5/15  . CHF (congestive heart failure)   . Shortness of breath     occ  . GERD (gastroesophageal reflux disease)   . S/P chemotherapy, time since 4-12 weeks   . Cancer     around nose.  New dx of lung cancer    ALLERGIES:  has No Known Allergies.  MEDICATIONS:  Current Outpatient Prescriptions  Medication Sig Dispense Refill  . aspirin 81 MG  tablet Take 81 mg by mouth daily.    . carvedilol (COREG) 6.25 MG tablet Take 1 tablet (6.25 mg total) by mouth 2 (two) times daily with a meal. 60 tablet 6  . FLUoxetine (PROZAC) 20 MG capsule Take 1 capsule (20 mg total) by mouth daily. 30 capsule 0  . furosemide (LASIX) 40 MG tablet Take one tablet by mouth one time daily 30 tablet 5  . hyaluronate sodium (RADIAPLEXRX) GEL Apply 1 application topically 2 (two) times daily.    Marland Kitchen levETIRAcetam (KEPPRA) 500 MG tablet Take 1 tablet (500 mg total) by mouth 2 (two) times daily. OVERDUE FOR APPT WITH DR. PAZ. NO FURTHER REFILLS. [phone]. 60 tablet 2  . lidocaine-prilocaine (EMLA) cream Apply 1 application topically as needed. 30 g 2  . Multiple Vitamin (MULTIVITAMIN WITH MINERALS) TABS tablet Take 1 tablet by mouth daily.    . pantoprazole (PROTONIX) 40 MG tablet Take 1 tablet (40 mg total) by mouth at bedtime. 30 tablet 5  . simvastatin (ZOCOR) 40 MG tablet Take 1 tablet (40 mg total) by mouth every evening. DUE FOR APPT WITH DR. PAZ. [phone]. 30 tablet 2  . HYDROcodone-acetaminophen (NORCO/VICODIN) 5-325 MG per tablet Take 1 tablet by mouth every 6 (six) hours as needed (cough). (Patient not taking: Reported on 03/14/2015) 60 tablet 0  . PRESCRIPTION MEDICATION Last dose 01/03/15. At the cancer center. Dr Julien Nordmann    .  prochlorperazine (COMPAZINE) 10 MG tablet Take 1 tablet (10 mg total) by mouth every 6 (six) hours as needed for nausea or vomiting. (Patient not taking: Reported on 03/14/2015) 30 tablet 1   No current facility-administered medications for this visit.    SURGICAL HISTORY:  Past Surgical History  Procedure Laterality Date  . Umbilical hernia repair  2010  . Hernia repair  2012  . Ventral hernia repair N/A 01/22/2013    Procedure: HERNIA REPAIR VENTRAL ADULT;  Surgeon: [name], MD;  Location: [place];  Service: General;  Laterality: N/A;  . Application of a-cell of chest/abdomen N/A 01/22/2013    Procedure: APPLICATION OF A-CELL  OF CHEST/ABDOMEN;  Surgeon: [name], MD;  Location: [place];  Service: General;  Laterality: N/A;  . Cystoscopy N/A 01/22/2013    Procedure: Consuela Mimes;  Surgeon: [name], MD;  Location: [facility];  Service: Urology;  Laterality: N/A;  Cystoscopy with balloon dilation. Insertion of coude catheter.  . Craniotomy Right 04/19/2014    Procedure: CRANIOTOMY HEMATOMA EVACUATION SUBDURAL;  Surgeon: [name], MD;  Location: [facility];  Service: Neurosurgery;  Laterality: Right;  right  . Craniotomy Right 04/23/2014    Procedure: Craniotomy for Intracerebral Hemorrhage;  Surgeon: [name], MD;  Location: [facility];  Service: Neurosurgery;  Laterality: Right;  . 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 Coronary artery bypass graft  12/24/2008    4 vessel  . Coronary artery bypass graft  2012?  Marland Kitchen Craniotomy N/A 09/13/2014    Procedure: CRANIOTOMY BONE FLAP/PROSTHETIC PLATE;  Surgeon: [name], MD;  Location: [place];  Service: Neurosurgery;  Laterality: N/A;  . Video bronchoscopy with endobronchial ultrasound N/A 11/09/2014    Procedure: VIDEO BRONCHOSCOPY WITH ENDOBRONCHIAL ULTRASOUND;  Surgeon: Collene Gobble, MD;  Location: [facility];  Service: Thoracic;  Laterality: N/A;    REVIEW OF SYSTEMS:  Constitutional: positive for fatigue Eyes: negative Ears, nose, mouth, throat, and face: negative Respiratory: positive for dyspnea on exertion Cardiovascular: negative Gastrointestinal: negative Genitourinary:negative Integument/breast: negative Hematologic/lymphatic: negative Musculoskeletal:negative Neurological: negative Behavioral/Psych: negative Endocrine: negative Allergic/Immunologic: negative   PHYSICAL EXAMINATION: General appearance: alert, cooperative, fatigued and no distress Head: Normocephalic, without obvious  abnormality, atraumatic Neck: no adenopathy, no JVD, supple, symmetrical, trachea midline and thyroid not enlarged, symmetric, no tenderness/mass/nodules Lymph nodes: Cervical, supraclavicular, and axillary nodes normal. Resp: clear to auscultation bilaterally Back: symmetric, no curvature. ROM normal. No CVA tenderness. Cardio: regular rate and rhythm, S1, S2 normal, no murmur, click, rub or gallop GI: soft, non-tender; bowel sounds normal; no masses,  no organomegaly Extremities: extremities normal, atraumatic, no cyanosis or edema Neurologic: Alert and oriented X 3, normal strength and tone. Normal symmetric reflexes. Normal coordination and gait  ECOG PERFORMANCE STATUS: 1 - Symptomatic but completely ambulatory  Blood pressure 128/79, pulse 113, temperature 97.7 F (36.5 C), temperature source Oral, resp. rate 22, height 5\' 8"  (1.727 m), weight 251 lb 14.4 oz (114.261 kg), SpO2 96 %.  LABORATORY DATA: Lab Results  Component Value Date   WBC 4.2 03/11/2015   HGB 9.7* 03/11/2015   HCT 28.6* 03/11/2015   MCV 94.2 03/11/2015   PLT 175 03/11/2015      Chemistry      Component Value Date/Time   NA 138 03/11/2015 0832   NA  134* 01/07/2015 0800   K 3.9 03/11/2015 0832   K 4.8 01/07/2015 0800   CL 103 01/07/2015 0800   CO2 22 03/11/2015 0832   CO2 24 01/07/2015 0800   BUN 13.9 03/11/2015 0832   BUN 31* 01/07/2015 0800   CREATININE 1.0 03/11/2015 0832   CREATININE 1.03 01/07/2015 0800   CREATININE 0.99 09/26/2012 1526      Component Value Date/Time   CALCIUM 8.5 03/11/2015 0832   CALCIUM 8.7 01/07/2015 0800   ALKPHOS 85 03/11/2015 0832   ALKPHOS 68 09/23/2014 0922   AST 25 03/11/2015 0832   AST 24 09/23/2014 0922   ALT 20 03/11/2015 0832   ALT 18 09/23/2014 0922   BILITOT 0.55 03/11/2015 0832   BILITOT 0.4 09/23/2014 0922       RADIOGRAPHIC STUDIES: Ct Chest W Contrast  03/11/2015   CLINICAL DATA:  Non-small cell lung cancer restaging. Ongoing chemotherapy.  Radiation therapy completed.  EXAM: CT CHEST WITH CONTRAST  TECHNIQUE: Multidetector CT imaging of the chest was performed during intravenous contrast administration.  CONTRAST:  64mL OMNIPAQUE IOHEXOL 300 MG/ML  SOLN  COMPARISON:  Multiple exams, including 11/16/2014  FINDINGS: Mediastinum/Nodes: Lower right paratracheal lymph node 1.9 cm in short axis on image 20 series 2, formerly 1.7 cm by my measurements.  Subcarinal lymph node 1.8 cm on image 28 series 2, formerly 2.0 cm by my measurements.  More cephalad right paratracheal lymph node 1.1 cm on image 15 series 2, previously the same by my measurements.  Aortic, branch vessel, and coronary atherosclerotic calcification with borderline cardiomegaly. Prior CABG.  Lungs/Pleura: New small bilateral pleural effusions with associated passive atelectasis. No obvious enhancement or nodularity along the pleura identified.  Emphysema.  Stable scattered mild subpleural nodularity.  The right upper lobe mass is directly along the minor fissure and may have some extension into the right middle lobe. Currently the mass is a maximum of 7 mm in vertical thickness, previously 1.3 cm on 09/16/2014 and 1.3 cm on 11/16/2014. The lesions orientation is flat and in the axial plane, with some associated volume loss, and this makes the anterior anterior-posterior and axial measurements less repeat oval and less useful, but currently I measure the lesion at 3.1 by 2.2 cm on image 24 of series 5, and measuring in a similar fashion on the prior exam I arrive at 3.0 by 2.1 cm. Again, I recommend reliance on the vertical measurement of thickness which is thought to be more reliable and repeated wall due to the orientation of the lesion.  No new significant lesions identified.  Upper abdomen: Unremarkable  Musculoskeletal: Thoracic kyphosis and spondylosis.  IMPRESSION: 1. Reduced size of the right upper lobe mass. The measurement which is thought to be most suitable and replicable given  the orientation of the lesion is the vertical thickness ; currently this is 7 mm compared to 1.3 cm previously. This indicates improvement in the primary lesion. 2. Persistent thoracic scratch that persistent mediastinal adenopathy, roughly similar to the prior exam. 3. New small bilateral pleural effusions with passive atelectasis. 4. Atherosclerosis.   Electronically Signed   By: Van Clines M.D.   On: 03/11/2015 10:29    ASSESSMENT AND PLAN: This is a very pleasant 69 years old white male with history of stage IIIa non-small cell lung cancer status post course of concurrent chemoradiation with weekly carboplatin and paclitaxel and tolerated his treatment fairly well except for the fatigue from the anemia of neoplastic disease. His recent CT scan of  the chest showed improvement in the right upper lobe lung mass as well as some of the mediastinal lymphadenopathy. I discussed the scan results and showed the images to the patient and his family. I gave him the option of continuous observation and close monitoring versus consideration of consolidation chemotherapy with 3 cycles of systemic chemotherapy with carboplatin for AUC of 5 and paclitaxel 175 MG/M2 every 3 weeks with Neulasta support. I discussed with the patient adverse effect of the chemotherapy including but not limited to alopecia, myelosuppression, nausea and vomiting, peripheral neuropathy, liver or renal dysfunction. The patient and his family requested some time to think about his options but I do to tentatively schedule his consolidation chemotherapy in 2 weeks. He will call back if he is not interested in proceeding with consolidation chemotherapy and in this case I will arrange for the patient repeat CT scan of the chest in 3 months with follow-up visit at that time. He was advised to call immediately if he has any concerning symptoms in the interval.  The patient voices understanding of current disease status and treatment  options and is in agreement with the current care plan.  All questions were answered. The patient knows to call the clinic with any problems, questions or concerns. We can certainly see the patient much sooner if necessary.  I spent 15 minutes counseling the patient face to face. The total time spent in the appointment was 25 minutes.  Disclaimer: This note was dictated with voice recognition software. Similar sounding words can inadvertently be transcribed and may not be corrected upon review.

## 2015-03-14 NOTE — [facility] Oncology Navigator Note (Unsigned)
Spoke with patient today at [facility].  Patient was given the option of more chemo or observation.  He will be scheduled for chemo in 2 weeks but will call if he decides observation.

## 2015-03-14 NOTE — Telephone Encounter (Signed)
gave and printed appt sched and avs fo rpt for April adn May....Marland Kitchensed added tx..pt will call back to that i can moved 4.25 tx closer to visit

## 2015-03-17 ENCOUNTER — Ambulatory Visit
Admission: RE | Admit: 2015-03-17 | Discharge: 2015-03-17 | Disposition: A | Discharge status: Home / Self Care | Payer: Self-pay | Source: Ambulatory Visit | Attending: Radiation Oncology | Admitting: Radiation Oncology

## 2015-03-17 ENCOUNTER — Encounter: Payer: Self-pay | Admitting: Radiation Oncology

## 2015-03-17 VITALS — BP 116/53 | HR 96 | Temp 97.8°F | Resp 16 | Wt 252.7 lb

## 2015-03-17 DIAGNOSIS — C3411 Malignant neoplasm of upper lobe, right bronchus or lung: Secondary | ICD-10-CM

## 2015-03-17 NOTE — Progress Notes (Signed)
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 Non-small cell carcinoma of lung, stage 3 162.3 C34.11     Interval Since Last Radiation:  5  weeks  Narrative:  The patient returns today for routine follow-up.   Denies headache, nausea, vomiting or diarrhea. Vitals stable. Denies night sweat. No weight loss noted. No edema of extremities noted. Scheduled to follow up with pulmonary doctor, Dr. [name], Tuesday coming. Reports occasional dizziness. Reports increased fatigue over the last two weeks. Reports he can only take a few steps before having to sit down and catch his breath. Reports a cough mostly dry. Reports on rare occasion the cough is productive but, denies hemoptysis. Reports very cold or very hot liquids "hand up in his throat when he tries to swallow." Wife reports the patient coughs frequently while eating. Hoarseness much worse. Reports occasionally he take Vicodin for relieve cough.                                       ALLERGIES:  has No Known Allergies.  Meds: Current Outpatient Prescriptions  Medication Sig Dispense Refill  . aspirin 81 MG tablet Take 81 mg by mouth daily.    . carvedilol (COREG) 6.25 MG tablet Take 1 tablet (6.25 mg total) by mouth 2 (two) times daily with a meal. 60 tablet 6  . FLUoxetine (PROZAC) 20 MG capsule Take 1 capsule (20 mg total) by mouth daily. 30 capsule 0  . furosemide (LASIX) 40 MG tablet Take one tablet by mouth one time daily 30 tablet 5  . levETIRAcetam (KEPPRA) 500 MG tablet Take 1 tablet (500 mg total) by mouth 2 (two) times daily. OVERDUE FOR APPT WITH DR. PAZ. NO FURTHER REFILLS. [phone]. 60 tablet 2  . lidocaine-prilocaine (EMLA) cream  Apply 1 application topically as needed. 30 g 2  . Multiple Vitamin (MULTIVITAMIN WITH MINERALS) TABS tablet Take 1 tablet by mouth daily.    . simvastatin (ZOCOR) 40 MG tablet Take 1 tablet (40 mg total) by mouth every evening. DUE FOR APPT WITH DR. PAZ. [phone]. 30 tablet 2  . hyaluronate sodium (RADIAPLEXRX) GEL Apply 1 application topically 2 (two) times daily.    Marland Kitchen HYDROcodone-acetaminophen (NORCO/VICODIN) 5-325 MG per tablet Take 1 tablet by mouth every 6 (six) hours as needed (cough). (Patient not taking: Reported on 03/14/2015) 60 tablet 0  . pantoprazole (PROTONIX) 40 MG tablet Take 1 tablet (40 mg total) by mouth at bedtime. (Patient not taking: Reported on 03/17/2015) 30 tablet 5  . PRESCRIPTION MEDICATION Last dose 01/03/15. At the cancer center. Dr Julien Nordmann    . prochlorperazine (COMPAZINE) 10 MG tablet Take 1 tablet (10 mg total) by mouth every 6 (six) hours as needed for nausea or vomiting. (Patient not taking: Reported on 03/14/2015) 30 tablet 1   No current facility-administered medications for this encounter.    Physical Findings: The patient is in no acute distress. Patient is alert and oriented.  weight is 252 lb 11.2 oz (114.624 kg). His oral temperature is 97.8 F (36.6 C). His blood pressure is 116/53  and his pulse is 96. His respiration is 16 and oxygen saturation is 97%. .  No significant changes.  Lab Findings: Lab Results  Component Value Date   WBC 4.2 03/11/2015   WBC 3.3* 01/07/2015   HGB 9.7* 03/11/2015   HGB 10.6* 01/07/2015   HCT 28.6* 03/11/2015   HCT 31.5* 01/07/2015   PLT 175 03/11/2015   PLT 141* 01/07/2015    Lab Results  Component Value Date   NA 138 03/11/2015   NA 134* 01/07/2015   K 3.9 03/11/2015   K 4.8 01/07/2015   CHLORIDE 106 03/11/2015   CO2 22 03/11/2015   CO2 24 01/07/2015   GLUCOSE 154* 03/11/2015   GLUCOSE 101* 01/07/2015   BUN 13.9 03/11/2015   BUN 31* 01/07/2015   CREATININE 1.0 03/11/2015   CREATININE 1.03 01/07/2015    CREATININE 0.99 09/26/2012   BILITOT 0.55 03/11/2015   BILITOT 0.4 09/23/2014   ALKPHOS 85 03/11/2015   ALKPHOS 68 09/23/2014   AST 25 03/11/2015   AST 24 09/23/2014   ALT 20 03/11/2015   ALT 18 09/23/2014   PROT 6.2* 03/11/2015   PROT 7.6 09/23/2014   ALBUMIN 3.1* 03/11/2015   ALBUMIN 3.5 09/23/2014   CALCIUM 8.5 03/11/2015   CALCIUM 8.7 01/07/2015   ANIONGAP 10 03/11/2015   ANIONGAP 7 01/07/2015    Radiographic Findings: Ct Chest W Contrast  03/11/2015   CLINICAL DATA:  Non-small cell lung cancer restaging. Ongoing chemotherapy. Radiation therapy completed.  EXAM: CT CHEST WITH CONTRAST  TECHNIQUE: Multidetector CT imaging of the chest was performed during intravenous contrast administration.  CONTRAST:  2mL OMNIPAQUE IOHEXOL 300 MG/ML  SOLN  COMPARISON:  Multiple exams, including 11/16/2014  FINDINGS: Mediastinum/Nodes: Lower right paratracheal lymph node 1.9 cm in short axis on image 20 series 2, formerly 1.7 cm by my measurements.  Subcarinal lymph node 1.8 cm on image 28 series 2, formerly 2.0 cm by my measurements.  More cephalad right paratracheal lymph node 1.1 cm on image 15 series 2, previously the same by my measurements.  Aortic, branch vessel, and coronary atherosclerotic calcification with borderline cardiomegaly. Prior CABG.  Lungs/Pleura: New small bilateral pleural effusions with associated passive atelectasis. No obvious enhancement or nodularity along the pleura identified.  Emphysema.  Stable scattered mild subpleural nodularity.  The right upper lobe mass is directly along the minor fissure and may have some extension into the right middle lobe. Currently the mass is a maximum of 7 mm in vertical thickness, previously 1.3 cm on 09/16/2014 and 1.3 cm on 11/16/2014. The lesions orientation is flat and in the axial plane, with some associated volume loss, and this makes the anterior anterior-posterior and axial measurements less repeat oval and less useful, but currently I  measure the lesion at 3.1 by 2.2 cm on image 24 of series 5, and measuring in a similar fashion on the prior exam I arrive at 3.0 by 2.1 cm. Again, I recommend reliance on the vertical measurement of thickness which is thought to be more reliable and repeated wall due to the orientation of the lesion.  No new significant lesions identified.  Upper abdomen: Unremarkable  Musculoskeletal: Thoracic kyphosis and spondylosis.  IMPRESSION: 1. Reduced size of the right upper lobe mass. The measurement which is thought to be most suitable and replicable given the orientation of the lesion is the vertical thickness ; currently this is 7 mm compared to 1.3 cm previously. This indicates improvement in the primary lesion. 2. Persistent thoracic scratch that  persistent mediastinal adenopathy, roughly similar to the prior exam. 3. New small bilateral pleural effusions with passive atelectasis. 4. Atherosclerosis.   Electronically Signed   By: Van Clines M.D.   On: 03/11/2015 10:29    Impression:  The patient is recovering from the effects of radiation. Recent CT shows improvement in treated disease. Having SOB related to anemia, COPD and radiation.  Plan:  F/U with med/onc for adjuvant therapy. f/u with rad/onc PRN. F/u with Dr. [name]  This document serves as a record of services personally performed by Tyler Pita, MD. It was created on his behalf by Pearlie Oyster, a trained medical scribe. The creation of this record is based on the scribe's personal observations and the provider's statements to them. This document has been checked and approved by the attending provider.      _____________________________________  Sheral Apley. Tammi Klippel, M.D.

## 2015-03-17 NOTE — Progress Notes (Signed)
Denies headache, nausea, vomiting or diarrhea. Vitals stable. Denies night sweat. No weight loss noted. No edema of extremities noted. Scheduled to follow up with pulmonary doctor, Dr. [name], Tuesday coming. Reports occasional dizziness. Reports increased fatigue over the last two weeks. Reports he can only take a few steps before having to sit down and catch his breath. Reports a cough mostly dry. Reports on rare occasion the cough is productive but, denies hemoptysis. Reports very cold or very hot liquids "hand up in his throat when he tries to swallow." Wife reports the patient coughs frequently while eating. Hoarseness much worse. Reports occasionally he take vicodin for relieve cough.

## 2015-03-18 ENCOUNTER — Ambulatory Visit: Payer: BLUE CROSS/BLUE SHIELD | Admitting: Internal Medicine

## 2015-03-21 ENCOUNTER — Encounter: Payer: Self-pay | Admitting: Internal Medicine

## 2015-03-21 ENCOUNTER — Ambulatory Visit (INDEPENDENT_AMBULATORY_CARE_PROVIDER_SITE_OTHER): Payer: BLUE CROSS/BLUE SHIELD | Admitting: Internal Medicine

## 2015-03-21 VITALS — BP 126/78 | HR 128 | Temp 98.1°F | Ht 68.0 in | Wt 250.0 lb

## 2015-03-21 DIAGNOSIS — R739 Hyperglycemia, unspecified: Secondary | ICD-10-CM

## 2015-03-21 DIAGNOSIS — S065X9A Traumatic subdural hemorrhage with loss of consciousness of unspecified duration, initial encounter: Secondary | ICD-10-CM

## 2015-03-21 DIAGNOSIS — S065XAA Traumatic subdural hemorrhage with loss of consciousness status unknown, initial encounter: Secondary | ICD-10-CM

## 2015-03-21 DIAGNOSIS — R0609 Other forms of dyspnea: Secondary | ICD-10-CM

## 2015-03-21 DIAGNOSIS — F1010 Alcohol abuse, uncomplicated: Secondary | ICD-10-CM

## 2015-03-21 DIAGNOSIS — I509 Heart failure, unspecified: Secondary | ICD-10-CM

## 2015-03-21 MED ORDER — CARVEDILOL 12.5 MG PO TABS: 12.5000 mg | ORAL_TABLET | Freq: Two times a day (BID) | ORAL | Status: DC

## 2015-03-21 MED ORDER — FLUOXETINE HCL 20 MG PO CAPS: 20.0000 mg | ORAL_CAPSULE | Freq: Every day | ORAL | Status: DC

## 2015-03-21 MED ORDER — SIMVASTATIN 40 MG PO TABS: 40.0000 mg | ORAL_TABLET | Freq: Every evening | ORAL | Status: DC

## 2015-03-21 MED ORDER — FUROSEMIDE 40 MG PO TABS: 40.0000 mg | ORAL_TABLET | Freq: Every day | ORAL | Status: DC

## 2015-03-21 MED ORDER — LEVETIRACETAM 500 MG PO TABS: 500.0000 mg | ORAL_TABLET | Freq: Two times a day (BID) | ORAL | Status: DC

## 2015-03-21 NOTE — Patient Instructions (Signed)
Get your blood work before you leave   Carvedilol: Will take 12.5 mg one tablet twice a day  Continue taking Lasix 40 mg one tablet every morning For the next 3 days only  take 2 tablets every morning to help with the  fluid   Come back to the office in 2 weeks   for a routine check up

## 2015-03-21 NOTE — Progress Notes (Signed)
Subjective:    Patient ID: Alejandro [name], male    DOB: [birth year], 69 y.o.   MRN: [id number]  DOS:  03/21/2015 Type of visit - description : rov Interval history: Since the last time he was here 07-2015 he was diagnosed with lung cancer, status post XRT and chemotherapy. Her main concern is that medications refill but also complaining of dyspnea on exertion. He reports that finish radiation therapy approximately 5 weeks ago, 4 weeks ago noted DOE. Symptoms are not associated with cough or wheezing but he gets somewhat pale. Symptoms decrease when he sits down and rest.  CAD, good compliance with medications except Lasix, he ran out a week ago, having some lower extremity edema. Unable to take aspirin due to history of craniotomy.  Subdural hematoma, 2015, status post a craniotomy complicated by infection. Currently stable, was discharged from the care of neurosurgery, still taking Keppra, wonders if he still needed.  Labs from 03/11/2015 reviewed  Review of Systems Denies fever chills Denies chest pain but occasionally has palpitations. No nausea, vomiting, diarrhea or blood in the stools. Some wheezing mostly at night No headaches, mild dizziness when he stands up.   Past Medical History  Diagnosis Date  . Systolic heart failure   . 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 Dyslipidemia   . HTN (hypertension)   . Atrial fibrillation   . Tobacco abuse   . Alcohol abuse   . H/O hiatal hernia   . Myocardial infarction   . Dysrhythmia     atrial fib  . Stroke 5/15  . CHF (congestive heart failure)   . Shortness of breath     occ  . GERD (gastroesophageal reflux disease)   . S/P chemotherapy, time since 4-12 weeks   . Cancer     around nose.  New dx of lung cancer    Past Surgical History  Procedure Laterality Date  . Umbilical hernia repair  2010  . Hernia repair  2012  . Ventral hernia repair N/A 01/22/2013    Procedure: HERNIA REPAIR VENTRAL  ADULT;  Surgeon: [name], MD;  Location: [facility];  Service: General;  Laterality: N/A;  . Application of a-cell of chest/abdomen N/A 01/22/2013    Procedure: APPLICATION OF A-CELL OF CHEST/ABDOMEN;  Surgeon: [name], MD;  Location: [facility];  Service: General;  Laterality: N/A;  . Cystoscopy N/A 01/22/2013    Procedure: Consuela Mimes;  Surgeon: [name], MD;  Location: [facility];  Service: Urology;  Laterality: N/A;  Cystoscopy with balloon dilation. Insertion of coude catheter.  . Craniotomy Right 04/19/2014    Procedure: CRANIOTOMY HEMATOMA EVACUATION SUBDURAL;  Surgeon: [name], MD;  Location: [facility];  Service: Neurosurgery;  Laterality: Right;  right  . Craniotomy Right 04/23/2014    Procedure: Craniotomy for Intracerebral Hemorrhage;  Surgeon: [name], MD;  Location: [facility];  Service: Neurosurgery;  Laterality: Right;  . 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 Coronary artery bypass graft  12/24/2008    4 vessel  . Coronary artery bypass graft  2012?  Marland Kitchen Craniotomy N/A 09/13/2014    Procedure: CRANIOTOMY BONE FLAP/PROSTHETIC PLATE;  Surgeon: [name], MD;  Location: [facility];  Service: Neurosurgery;  Laterality: N/A;  . Video  bronchoscopy with endobronchial ultrasound N/A 11/09/2014    Procedure: VIDEO BRONCHOSCOPY WITH ENDOBRONCHIAL ULTRASOUND;  Surgeon: Collene Gobble, MD;  Location: [facility];  Service: Thoracic;  Laterality: N/A;    History   Social History  . Marital Status: Married    Spouse Name: N/A  . Number of Children: 2  . Years of Education: N/A   Occupational History  . [employer]   .     Social History Main Topics  . Smoking status: Former Smoker -- 1.00 packs/day for 50 years    Types: Cigarettes    Quit date: 04/19/2013  . Smokeless tobacco: Never Used  . Alcohol  Use: No     Comment: quit  . Drug Use: No  . Sexual Activity: Not on file   Other Topics Concern  . Not on file   Social History Narrative        Medication List       This list is accurate as of: 03/21/15  2:31 PM.  Always use your most recent med list.               aspirin 81 MG tablet  Take 81 mg by mouth daily.     carvedilol 6.25 MG tablet  Commonly known as:  COREG  Take 1 tablet (6.25 mg total) by mouth 2 (two) times daily with a meal.     FLUoxetine 20 MG capsule  Commonly known as:  PROZAC  Take 1 capsule (20 mg total) by mouth daily.     furosemide 40 MG tablet  Commonly known as:  LASIX  Take one tablet by mouth one time daily     hyaluronate sodium Gel  Apply 1 application topically 2 (two) times daily.     HYDROcodone-acetaminophen 5-325 MG per tablet  Commonly known as:  NORCO/VICODIN  Take 1 tablet by mouth every 6 (six) hours as needed (cough).     levETIRAcetam 500 MG tablet  Commonly known as:  KEPPRA  Take 1 tablet (500 mg total) by mouth 2 (two) times daily. OVERDUE FOR APPT WITH DR. Sheppard Luckenbach. NO FURTHER REFILLS. [phone].     lidocaine-prilocaine cream  Commonly known as:  EMLA  Apply 1 application topically as needed.     multivitamin with minerals Tabs tablet  Take 1 tablet by mouth daily.     pantoprazole 40 MG tablet  Commonly known as:  PROTONIX  Take 1 tablet (40 mg total) by mouth at bedtime.     PRESCRIPTION MEDICATION  Last dose 01/03/15. At the cancer center. Dr Julien Nordmann     prochlorperazine 10 MG tablet  Commonly known as:  COMPAZINE  Take 1 tablet (10 mg total) by mouth every 6 (six) hours as needed for nausea or vomiting.     simvastatin 40 MG tablet  Commonly known as:  ZOCOR  Take 1 tablet (40 mg total) by mouth every evening. DUE FOR APPT WITH DR. Lorre Opdahl. [phone].           Objective:   Physical Exam BP 126/78 mmHg  Pulse 128  Temp(Src) 98.1 F (36.7 C) (Oral)  Ht 5\' 8"  (1.727 m)  Wt 250 lb (113.399 kg)  BMI  38.02 kg/m2  SpO2 97%  General:   Well developed, well nourished . NAD.  HEENT:  Normocephalic . Face symmetric, atraumatic. Lightly pale, no jaundice Slight increased JVD at 45 Lungs:  decrease breath sounds Normal respiratory effort, no intercostal retractions, no accessory muscle use. Heart: Tachycardic, irregular  Muscle skeletal: +/+++ periankle edema bilaterally  Skin: Not pale. Not jaundice Neurologic:  alert & oriented X3.  Speech normal, gait appropriate for age and unassisted Psych--  Cognition and judgment appear intact.  Cooperative with normal attention span and concentration.  Behavior appropriate. No anxious or depressed appearing.       Assessment & Plan:    DOE Chief complaint today is dyspnea on exertion, DDX include: Anemia Poorly controlled A. fib rate, EKG showing a heart rate of 134, no acute CAD-CHF as he has some evidence of vol overload based on the edema, slightly increased JVD and recent CT showing a new bilateral pleural effusion COPD Multifactorial (most likely) Plan: Refill Lasix (has been out for a week ), current dose is 40 mg daily. Due for the next 3 days he is not take 2 tablets a day. Increase carvedilol from 6.25-12.5 twice a day trying to get better rate control Case discussed with cardiology, they think is a good plan and they will call and set up a visit to see cardiology in the next 10 days ER if symptoms severe Check the A1c as his sugar has been slightly elevated lately. Follow-up 2 weeks

## 2015-03-21 NOTE — Assessment & Plan Note (Signed)
Abstinent

## 2015-03-21 NOTE — Assessment & Plan Note (Addendum)
Still on Kepra,will call Dr. Saintclair Halsted an see if he still needs to stay on such medicine

## 2015-03-21 NOTE — Progress Notes (Signed)
Pre visit review using our clinic review tool, if applicable. No additional management support is needed unless otherwise documented below in the visit note. 

## 2015-03-22 ENCOUNTER — Ambulatory Visit (INDEPENDENT_AMBULATORY_CARE_PROVIDER_SITE_OTHER): Payer: BLUE CROSS/BLUE SHIELD | Admitting: Emergency Medicine

## 2015-03-22 ENCOUNTER — Encounter: Payer: Self-pay | Admitting: Emergency Medicine

## 2015-03-22 VITALS — BP 110/70 | HR 94 | Ht 68.0 in | Wt 250.0 lb

## 2015-03-22 DIAGNOSIS — C3411 Malignant neoplasm of upper lobe, right bronchus or lung: Secondary | ICD-10-CM

## 2015-03-22 DIAGNOSIS — J449 Chronic obstructive pulmonary disease, unspecified: Secondary | ICD-10-CM | POA: Diagnosis not present

## 2015-03-22 LAB — HEMOGLOBIN A1C: Hgb A1c MFr Bld: 4.9 % (ref 4.6–6.5)

## 2015-03-22 MED ORDER — ALBUTEROL SULFATE HFA 108 (90 BASE) MCG/ACT IN AERS: 2.0000 | INHALATION_SPRAY | RESPIRATORY_TRACT | Status: DC | PRN

## 2015-03-22 NOTE — Assessment & Plan Note (Signed)
His spirometry and symptoms are consistent with COPD. He has not been tried on a bronchodilator like to do this now. We discussed the different options. I'll start him on Spiriva respimat 2 puffs once a day and also teach him how to use albuterol when necessary. We will perform walking oximetry today and discuss initiation of oxygen if he desaturates. I will follow him in one month to assess his improvement on bronchodilator

## 2015-03-22 NOTE — Assessment & Plan Note (Signed)
Treatment has been concurrent chemoradiation at the [facility]. He is scheduled to restart chemotherapy at the end of this month. His CT scan from April 2016 was reviewed by me personally and discussed with the patient.

## 2015-03-22 NOTE — Progress Notes (Signed)
HPI: 69 yo man follows for RUL mass and mediastinal LAD. We performed EBUS, got good nodal tissue but no dx. He follows now to discuss next options. He had a repeat CT scan today. He is feeling well s/p our bronchoscopy.   ROV 03/22/15 -- 69 yo former smoker (40-50 pk-yrs) patient follows for squamous cell lung cancer. He's been treated by Dr. Julien Nordmann with concurrent chemoradiation, last dose was about 5 weeks ago. CT scan of the chest performed 03/11/15 shows a decrease in size of his right upper lobe mass and similar mediastinal lymphadenopathy.  He is to restart chemo at the end of this month.  He has a lot of fatigue, has dyspnea that his wife points out is severe. He is not on any BD.     Past Medical History  Diagnosis Date  . Systolic heart failure   . 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 Dyslipidemia   . HTN (hypertension)   . Atrial fibrillation   . Tobacco abuse   . Alcohol abuse   . H/O hiatal hernia   . Myocardial infarction   . Dysrhythmia     atrial fib  . Stroke 5/15  . CHF (congestive heart failure)   . Shortness of breath     occ  . GERD (gastroesophageal reflux disease)   . S/P chemotherapy, time since 4-12 weeks   . Cancer     around nose.  New dx of lung cancer     Family History  Problem Relation Age of Onset  . Diabetes Mother   . Heart disease Mother   . Diabetes Father   . Heart disease Father   . Heart disease Brother     s/p CABG at 61  . Colon cancer Neg Hx   . Prostate cancer Neg Hx   . Rheum arthritis Daughter      History   Social History  . Marital Status: Married    Spouse Name: N/A  . Number of Children: 2  . Years of Education: N/A   Occupational History  . [employer]   .     Social History Main Topics  . Smoking status: Former Smoker -- 1.00 packs/day for 50 years    Types: Cigarettes    Quit date: 04/19/2013  . Smokeless tobacco: Never Used  . Alcohol Use: No     Comment: quit  .  Drug Use: No  . Sexual Activity: Not on file   Other Topics Concern  . Not on file   Social History Narrative     No Known Allergies   Outpatient Prescriptions Prior to Visit  Medication Sig Dispense Refill  . aspirin 81 MG tablet Take 81 mg by mouth daily.    . carvedilol (COREG) 12.5 MG tablet Take 1 tablet (12.5 mg total) by mouth 2 (two) times daily with a meal. 60 tablet 3  . FLUoxetine (PROZAC) 20 MG capsule Take 1 capsule (20 mg total) by mouth daily. 30 capsule 3  . furosemide (LASIX) 40 MG tablet Take 1 tablet (40 mg total) by mouth daily. 30 tablet 3  . hyaluronate sodium (RADIAPLEXRX) GEL Apply 1 application topically 2 (two) times daily.    Marland Kitchen levETIRAcetam (KEPPRA) 500 MG tablet Take 1 tablet (500 mg total) by mouth 2 (two) times daily. 60 tablet 3  . lidocaine-prilocaine (EMLA) cream Apply 1 application topically as needed. 30 g 2  . Multiple Vitamin (MULTIVITAMIN WITH MINERALS)  TABS tablet Take 1 tablet by mouth daily.    . pantoprazole (PROTONIX) 40 MG tablet Take 1 tablet (40 mg total) by mouth at bedtime. 30 tablet 5  . PRESCRIPTION MEDICATION Last dose 01/03/15. At the cancer center. Dr Julien Nordmann    . simvastatin (ZOCOR) 40 MG tablet Take 1 tablet (40 mg total) by mouth every evening. 30 tablet 3  . HYDROcodone-acetaminophen (NORCO/VICODIN) 5-325 MG per tablet Take 1 tablet by mouth every 6 (six) hours as needed (cough). (Patient not taking: Reported on 03/14/2015) 60 tablet 0  . prochlorperazine (COMPAZINE) 10 MG tablet Take 1 tablet (10 mg total) by mouth every 6 (six) hours as needed for nausea or vomiting. (Patient not taking: Reported on 03/14/2015) 30 tablet 1   No facility-administered medications prior to visit.    Filed Vitals:   03/22/15 1051 03/22/15 1052  BP:  110/70  Pulse:  94  Height: '5\' 8"'$  (1.727 m)   Weight: 250 lb (113.399 kg)   SpO2:  98%   Gen: Pleasant, well-nourished, in no distress,  normal affect  ENT: No lesions,  mouth clear,  oropharynx  clear, no postnasal drip  Neck: No JVD, no TMG, no carotid bruits  Lungs: No use of accessory muscles, distant, clear without rales or rhonchi  Cardiovascular: RRR, heart sounds normal, no murmur or gallops, no peripheral edema  Musculoskeletal: No deformities, no cyanosis or clubbing  Neuro: alert, non focal  Skin: Warm, no lesions or rashes    CT scan 03/11/15 --  IMPRESSION: 1. Reduced size of the right upper lobe mass. The measurement which is thought to be most suitable and replicable given the orientation of the lesion is the vertical thickness ; currently this is 7 mm compared to 1.3 cm previously. This indicates improvement in the primary lesion. 2. Persistent thoracic scratch that persistent mediastinal adenopathy, roughly similar to the prior exam. 3. New small bilateral pleural effusions with passive atelectasis. 4. Atherosclerosis.   Non-small cell carcinoma of lung, stage 3 Treatment has been concurrent chemoradiation at the [facility]. He is scheduled to restart chemotherapy at the end of this month. His CT scan from April 2016 was reviewed by me personally and discussed with the patient.    COPD mixed type His spirometry and symptoms are consistent with COPD. He has not been tried on a bronchodilator like to do this now. We discussed the different options. I'll start him on Spiriva respimat 2 puffs once a day and also teach him how to use albuterol when necessary. We will perform walking oximetry today and discuss initiation of oxygen if he desaturates. I will follow him in one month to assess his improvement on bronchodilator

## 2015-03-22 NOTE — Patient Instructions (Signed)
We will start Spiriva Respimat, 2 puffs once a day see if you benefit We will start albuterol to use 2 puffs if needed for shortness of breath We will perform walking oximetry today Follow with Dr [name] in 1 month to discuss whether the inhaled medications and helped

## 2015-03-22 NOTE — Addendum Note (Signed)
Addended by: Desmond Dike C on: 03/22/2015 01:24 PM   Modules accepted: Orders

## 2015-03-24 ENCOUNTER — Telehealth: Payer: Self-pay

## 2015-03-24 NOTE — Telephone Encounter (Signed)
-----   Message from Colon Branch, MD sent at 03/22/2015  5:55 PM EDT ----- Regarding: call dr cram  Please call Dr. Saintclair Halsted office, ask Dr. Saintclair Halsted nurse if the patient still needs to be on Keppra and for how long, he used to prescribe it.Dr Saintclair Halsted used to rx but pt Alejandro [name] been discharged from his care

## 2015-03-24 NOTE — Telephone Encounter (Signed)
LMOM for Alejandro [name], Dr. Windy Carina CMA regarding Pt and whether he should still be taking Keppra or not. Informed her to call office at her earliest convenience.

## 2015-03-28 ENCOUNTER — Ambulatory Visit: Payer: BLUE CROSS/BLUE SHIELD

## 2015-03-28 ENCOUNTER — Telehealth: Payer: Self-pay | Admitting: Internal Medicine

## 2015-03-28 ENCOUNTER — Ambulatory Visit (HOSPITAL_BASED_OUTPATIENT_CLINIC_OR_DEPARTMENT_OTHER): Payer: BLUE CROSS/BLUE SHIELD

## 2015-03-28 ENCOUNTER — Ambulatory Visit: Payer: Self-pay

## 2015-03-28 ENCOUNTER — Other Ambulatory Visit (HOSPITAL_BASED_OUTPATIENT_CLINIC_OR_DEPARTMENT_OTHER): Payer: BLUE CROSS/BLUE SHIELD

## 2015-03-28 ENCOUNTER — Ambulatory Visit: Payer: BLUE CROSS/BLUE SHIELD | Admitting: Nurse Practitioner

## 2015-03-28 ENCOUNTER — Ambulatory Visit (HOSPITAL_BASED_OUTPATIENT_CLINIC_OR_DEPARTMENT_OTHER): Payer: BLUE CROSS/BLUE SHIELD | Admitting: Oncology

## 2015-03-28 ENCOUNTER — Encounter: Payer: Self-pay | Admitting: Oncology

## 2015-03-28 VITALS — BP 117/77 | HR 87 | Temp 98.0°F | Resp 19 | Ht 68.0 in | Wt 245.9 lb

## 2015-03-28 DIAGNOSIS — Z5111 Encounter for antineoplastic chemotherapy: Secondary | ICD-10-CM | POA: Diagnosis not present

## 2015-03-28 DIAGNOSIS — C3411 Malignant neoplasm of upper lobe, right bronchus or lung: Secondary | ICD-10-CM

## 2015-03-28 DIAGNOSIS — Z95828 Presence of other vascular implants and grafts: Secondary | ICD-10-CM

## 2015-03-28 DIAGNOSIS — C3491 Malignant neoplasm of unspecified part of right bronchus or lung: Secondary | ICD-10-CM

## 2015-03-28 DIAGNOSIS — C3490 Malignant neoplasm of unspecified part of unspecified bronchus or lung: Secondary | ICD-10-CM

## 2015-03-28 LAB — CBC WITH DIFFERENTIAL/PLATELET
BASO%: 0.1 % (ref 0.0–2.0)
BASOS ABS: 0 10*3/uL (ref 0.0–0.1)
EOS ABS: 0.1 10*3/uL (ref 0.0–0.5)
EOS%: 1.5 % (ref 0.0–7.0)
HCT: 33.8 % — ABNORMAL LOW (ref 38.4–49.9)
HGB: 11.4 g/dL — ABNORMAL LOW (ref 13.0–17.1)
LYMPH#: 0.8 10*3/uL — AB (ref 0.9–3.3)
LYMPH%: 10.8 % — AB (ref 14.0–49.0)
MCH: 31.8 pg (ref 27.2–33.4)
MCHC: 33.7 g/dL (ref 32.0–36.0)
MCV: 94.4 fL (ref 79.3–98.0)
MONO#: 0.6 10*3/uL (ref 0.1–0.9)
MONO%: 8.1 % (ref 0.0–14.0)
NEUT#: 5.7 10*3/uL (ref 1.5–6.5)
NEUT%: 79.5 % — ABNORMAL HIGH (ref 39.0–75.0)
PLATELETS: 189 10*3/uL (ref 140–400)
RBC: 3.58 10*6/uL — AB (ref 4.20–5.82)
RDW: 17.3 % — ABNORMAL HIGH (ref 11.0–14.6)
WBC: 7.2 10*3/uL (ref 4.0–10.3)

## 2015-03-28 LAB — COMPREHENSIVE METABOLIC PANEL (CC13)
ALK PHOS: 81 U/L (ref 40–150)
ALT: 21 U/L (ref 0–55)
AST: 27 U/L (ref 5–34)
Albumin: 3.4 g/dL — ABNORMAL LOW (ref 3.5–5.0)
Anion Gap: 13 mEq/L — ABNORMAL HIGH (ref 3–11)
BUN: 21 mg/dL (ref 7.0–26.0)
CHLORIDE: 103 meq/L (ref 98–109)
CO2: 22 mEq/L (ref 22–29)
Calcium: 9.3 mg/dL (ref 8.4–10.4)
Creatinine: 1.3 mg/dL (ref 0.7–1.3)
EGFR: 55 mL/min/{1.73_m2} — AB (ref >90)
Glucose: 141 mg/dl — ABNORMAL HIGH (ref 70–140)
Potassium: 3.5 mEq/L (ref 3.5–5.1)
SODIUM: 138 meq/L (ref 136–145)
Total Bilirubin: 0.56 mg/dL (ref 0.20–1.20)
Total Protein: 6.8 g/dL (ref 6.4–8.3)

## 2015-03-28 MED ORDER — SODIUM CHLORIDE 0.9 % IV SOLN
Freq: Once | INTRAVENOUS | Status: AC
  Administered 2015-03-28: 12:00:00 via INTRAVENOUS

## 2015-03-28 MED ORDER — HEPARIN SOD (PORK) LOCK FLUSH 100 UNIT/ML IV SOLN
500.0000 [IU] | Freq: Once | INTRAVENOUS | Status: AC | PRN
  Administered 2015-03-28: 500 [IU]
  Filled 2015-03-28: qty 5

## 2015-03-28 MED ORDER — FAMOTIDINE IN NACL 20-0.9 MG/50ML-% IV SOLN
20.0000 mg | Freq: Once | INTRAVENOUS | Status: AC
  Administered 2015-03-28: 20 mg via INTRAVENOUS

## 2015-03-28 MED ORDER — DEXAMETHASONE SODIUM PHOSPHATE 100 MG/10ML IJ SOLN
Freq: Once | INTRAMUSCULAR | Status: AC
  Administered 2015-03-28: 12:00:00 via INTRAVENOUS
  Filled 2015-03-28: qty 8

## 2015-03-28 MED ORDER — LIDOCAINE-PRILOCAINE 2.5-2.5 % EX CREA: 1.0000 "application " | TOPICAL_CREAM | CUTANEOUS | Status: DC | PRN

## 2015-03-28 MED ORDER — SODIUM CHLORIDE 0.9 % IJ SOLN
10.0000 mL | INTRAMUSCULAR | Status: DC | PRN
  Administered 2015-03-28: 10 mL via INTRAVENOUS
  Filled 2015-03-28: qty 10

## 2015-03-28 MED ORDER — DIPHENHYDRAMINE HCL 50 MG/ML IJ SOLN
INTRAMUSCULAR | Status: AC
  Filled 2015-03-28: qty 1

## 2015-03-28 MED ORDER — DIPHENHYDRAMINE HCL 50 MG/ML IJ SOLN
50.0000 mg | Freq: Once | INTRAMUSCULAR | Status: AC
  Administered 2015-03-28: 50 mg via INTRAVENOUS

## 2015-03-28 MED ORDER — PACLITAXEL CHEMO INJECTION 300 MG/50ML
175.0000 mg/m2 | Freq: Once | INTRAVENOUS | Status: AC
  Administered 2015-03-28: 408 mg via INTRAVENOUS
  Filled 2015-03-28: qty 68

## 2015-03-28 MED ORDER — SODIUM CHLORIDE 0.9 % IV SOLN
550.0000 mg | Freq: Once | INTRAVENOUS | Status: AC
  Administered 2015-03-28: 550 mg via INTRAVENOUS
  Filled 2015-03-28: qty 55

## 2015-03-28 MED ORDER — FAMOTIDINE IN NACL 20-0.9 MG/50ML-% IV SOLN
INTRAVENOUS | Status: AC
  Filled 2015-03-28: qty 50

## 2015-03-28 MED ORDER — SODIUM CHLORIDE 0.9 % IJ SOLN
10.0000 mL | INTRAMUSCULAR | Status: DC | PRN
  Administered 2015-03-28: 10 mL
  Filled 2015-03-28: qty 10

## 2015-03-28 NOTE — Progress Notes (Signed)
[place] Telephone:([phone]) [phone]   Fax:([phone]) [place], [place] [address] [place] [address]  DIAGNOSIS: Non-small cell carcinoma of lung, stage 3  Staging form: Lung, AJCC 7th Edition  Clinical stage from 12/16/2014: Stage IIIA (T1b, N2, M0) - Signed by Curt Bears, MD on 12/18/2014  Staging comments: Squamous cell carcinoma  PRIOR THERAPY: Concurrent chemoradiation with weekly carboplatin for an AUC of 2 and paclitaxel 45 mg/m2.  CURRENT THERAPY: Consolidation chemotherapy with carboplatin AUC of 5 and paclitaxel 175 mg/m given every 3 weeks with Neulasta support to begin on 03/28/2015.  INTERVAL HISTORY: Alejandro [name] 69 y.o. male returns to the clinic today for follow-up visit accompanied by his wife. The patient tolerated the previous course of his concurrent chemoradiation fairly well with no significant adverse effects except for mild fatigue secondary to anemia of neoplastic disease. The patient denied having any significant chest pain but continues to have shortness breath with exertion was no cough or hemoptysis. He has no significant weight loss or night sweats. He has no nausea or vomiting, no fever or chills. He is here to proceed with cycle 1 of consolidation chemotherapy with carboplatin AUC of 5 and paclitaxel 175 mg meter squared every 3 weeks with Neulasta.  MEDICAL HISTORY: Past Medical History  Diagnosis Date  . Systolic heart failure   . 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 Dyslipidemia   . HTN (hypertension)   . Atrial fibrillation   . Tobacco abuse   . Alcohol abuse   . H/O hiatal hernia   . Myocardial infarction   . Dysrhythmia     atrial fib  . Stroke 5/15  . CHF (congestive heart failure)   . Shortness of breath     occ  . GERD (gastroesophageal reflux disease)   . S/P chemotherapy, time since 4-12 weeks   . Cancer     around nose.  New dx of  lung cancer    ALLERGIES:  has No Known Allergies.  MEDICATIONS:  Current Outpatient Prescriptions  Medication Sig Dispense Refill  . albuterol (PROVENTIL HFA;VENTOLIN HFA) 108 (90 BASE) MCG/ACT inhaler Inhale 2 puffs into the lungs every 4 (four) hours as needed for wheezing or shortness of breath. 1 Inhaler 6  . aspirin 81 MG tablet Take 81 mg by mouth daily.    . carvedilol (COREG) 12.5 MG tablet Take 1 tablet (12.5 mg total) by mouth 2 (two) times daily with a meal. 60 tablet 3  . FLUoxetine (PROZAC) 20 MG capsule Take 1 capsule (20 mg total) by mouth daily. 30 capsule 3  . furosemide (LASIX) 40 MG tablet Take 1 tablet (40 mg total) by mouth daily. 30 tablet 3  . hyaluronate sodium (RADIAPLEXRX) GEL Apply 1 application topically 2 (two) times daily.    Marland Kitchen levETIRAcetam (KEPPRA) 500 MG tablet Take 1 tablet (500 mg total) by mouth 2 (two) times daily. 60 tablet 3  . lidocaine-prilocaine (EMLA) cream Apply 1 application topically as needed. 30 g 2  . Multiple Vitamin (MULTIVITAMIN WITH MINERALS) TABS tablet Take 1 tablet by mouth daily.    . pantoprazole (PROTONIX) 40 MG tablet Take 1 tablet (40 mg total) by mouth at bedtime. 30 tablet 5  . PRESCRIPTION MEDICATION Last dose 01/03/15. At the cancer center. Dr Julien Nordmann    . simvastatin (ZOCOR) 40 MG tablet Take 1 tablet (40 mg  total) by mouth every evening. 30 tablet 3   No current facility-administered medications for this visit.   Facility-Administered Medications Ordered in Other Visits  Medication Dose Route Frequency Provider Last Rate Last Dose  . CARBOplatin (PARAPLATIN) 550 mg in sodium chloride 0.9 % 250 mL chemo infusion  550 mg Intravenous Once Curt Bears, MD      . heparin lock flush 100 unit/mL  500 Units Intracatheter Once PRN Curt Bears, MD      . PACLitaxel (TAXOL) 408 mg in dextrose 5 % 500 mL chemo infusion (> '80mg'$ /m2)  175 mg/m2 (Treatment Plan Actual) Intravenous Once Curt Bears, MD 189 mL/hr at 03/28/15 1232  408 mg at 03/28/15 1232  . sodium chloride 0.9 % injection 10 mL  10 mL Intracatheter PRN Curt Bears, MD        SURGICAL HISTORY:  Past Surgical History  Procedure Laterality Date  . Umbilical hernia repair  2010  . Hernia repair  2012  . Ventral hernia repair N/A 01/22/2013    Procedure: HERNIA REPAIR VENTRAL ADULT;  Surgeon: [name], MD;  Location: [facility];  Service: General;  Laterality: N/A;  . Application of a-cell of chest/abdomen N/A 01/22/2013    Procedure: APPLICATION OF A-CELL OF CHEST/ABDOMEN;  Surgeon: [name], MD;  Location: [facility];  Service: General;  Laterality: N/A;  . Cystoscopy N/A 01/22/2013    Procedure: Consuela Mimes;  Surgeon: [name], MD;  Location: [facility];  Service: Urology;  Laterality: N/A;  Cystoscopy with balloon dilation. Insertion of coude catheter.  . Craniotomy Right 04/19/2014    Procedure: CRANIOTOMY HEMATOMA EVACUATION SUBDURAL;  Surgeon: [name], MD;  Location: [facility];  Service: Neurosurgery;  Laterality: Right;  right  . Craniotomy Right 04/23/2014    Procedure: Craniotomy for Intracerebral Hemorrhage;  Surgeon: [name], MD;  Location: [facility];  Service: Neurosurgery;  Laterality: Right;  . 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 Coronary artery bypass graft  12/24/2008    4 vessel  . Coronary artery bypass graft  2012?  Marland Kitchen Craniotomy N/A 09/13/2014    Procedure: CRANIOTOMY BONE FLAP/PROSTHETIC PLATE;  Surgeon: [name], MD;  Location: [facility];  Service: Neurosurgery;  Laterality: N/A;  . Video bronchoscopy with endobronchial ultrasound N/A 11/09/2014    Procedure: VIDEO BRONCHOSCOPY WITH ENDOBRONCHIAL ULTRASOUND;  Surgeon: Collene Gobble, MD;  Location: [facility];  Service: Thoracic;  Laterality: N/A;    REVIEW OF SYSTEMS:  Constitutional: positive for  fatigue Eyes: negative Ears, nose, mouth, throat, and face: negative Respiratory: positive for dyspnea on exertion Cardiovascular: negative Gastrointestinal: negative Genitourinary:negative Integument/breast: negative Hematologic/lymphatic: negative Musculoskeletal:negative Neurological: negative Behavioral/Psych: negative Endocrine: negative Allergic/Immunologic: negative   PHYSICAL EXAMINATION: General appearance: alert, cooperative, fatigued and no distress Head: Normocephalic, without obvious abnormality, atraumatic Neck: no adenopathy, no JVD, supple, symmetrical, trachea midline and thyroid not enlarged, symmetric, no tenderness/mass/nodules Lymph nodes: Cervical, supraclavicular, and axillary nodes normal. Resp: clear to auscultation bilaterally Back: symmetric, no curvature. ROM normal. No CVA tenderness. Cardio: regular rate and rhythm, S1, S2 normal, no murmur, click, rub or gallop GI: soft, non-tender; bowel sounds normal; no masses,  no organomegaly Extremities: extremities normal, atraumatic, no cyanosis or edema Neurologic: Alert and oriented X 3, normal strength and tone. Normal symmetric reflexes. Normal coordination and gait  ECOG PERFORMANCE STATUS: 1 - Symptomatic but completely ambulatory  Blood pressure 117/77, pulse 87, temperature 98 F (36.7 C), temperature source Oral, resp. rate 19, height '5\' 8"'$  (1.727 m), weight 245 lb 14.4 oz (111.54 kg), SpO2 96 %.  LABORATORY DATA: Lab Results  Component Value Date   WBC 7.2 03/28/2015   HGB 11.4* 03/28/2015   HCT 33.8* 03/28/2015   MCV 94.4 03/28/2015   PLT 189 03/28/2015      Chemistry      Component Value Date/Time   NA 138 03/28/2015 1006   NA 134* 01/07/2015 0800   K 3.5 03/28/2015 1006   K 4.8 01/07/2015 0800   CL 103 01/07/2015 0800   CO2 22 03/28/2015 1006   CO2 24 01/07/2015 0800   BUN 21.0 03/28/2015 1006   BUN 31* 01/07/2015 0800   CREATININE 1.3 03/28/2015 1006   CREATININE 1.03  01/07/2015 0800   CREATININE 0.99 09/26/2012 1526      Component Value Date/Time   CALCIUM 9.3 03/28/2015 1006   CALCIUM 8.7 01/07/2015 0800   ALKPHOS 81 03/28/2015 1006   ALKPHOS 68 09/23/2014 0922   AST 27 03/28/2015 1006   AST 24 09/23/2014 0922   ALT 21 03/28/2015 1006   ALT 18 09/23/2014 0922   BILITOT 0.56 03/28/2015 1006   BILITOT 0.4 09/23/2014 0922       RADIOGRAPHIC STUDIES: Ct Chest W Contrast  03/11/2015   CLINICAL DATA:  Non-small cell lung cancer restaging. Ongoing chemotherapy. Radiation therapy completed.  EXAM: CT CHEST WITH CONTRAST  TECHNIQUE: Multidetector CT imaging of the chest was performed during intravenous contrast administration.  CONTRAST:  36m OMNIPAQUE IOHEXOL 300 MG/ML  SOLN  COMPARISON:  Multiple exams, including 11/16/2014  FINDINGS: Mediastinum/Nodes: Lower right paratracheal lymph node 1.9 cm in short axis on image 20 series 2, formerly 1.7 cm by my measurements.  Subcarinal lymph node 1.8 cm on image 28 series 2, formerly 2.0 cm by my measurements.  More cephalad right paratracheal lymph node 1.1 cm on image 15 series 2, previously the same by my measurements.  Aortic, branch vessel, and coronary atherosclerotic calcification with borderline cardiomegaly. Prior CABG.  Lungs/Pleura: New small bilateral pleural effusions with associated passive atelectasis. No obvious enhancement or nodularity along the pleura identified.  Emphysema.  Stable scattered mild subpleural nodularity.  The right upper lobe mass is directly along the minor fissure and may have some extension into the right middle lobe. Currently the mass is a maximum of 7 mm in vertical thickness, previously 1.3 cm on 09/16/2014 and 1.3 cm on 11/16/2014. The lesions orientation is flat and in the axial plane, with some associated volume loss, and this makes the anterior anterior-posterior and axial measurements less repeat oval and less useful, but currently I measure the lesion at 3.1 by 2.2 cm on  image 24 of series 5, and measuring in a similar fashion on the prior exam I arrive at 3.0 by 2.1 cm. Again, I recommend reliance on the vertical measurement of thickness which is thought to be more reliable and repeated wall due to the orientation of the lesion.  No new significant lesions identified.  Upper abdomen: Unremarkable  Musculoskeletal: Thoracic kyphosis and spondylosis.  IMPRESSION: 1. Reduced size of the right upper lobe mass. The measurement which is thought to be most suitable and replicable given the orientation of the lesion is the vertical thickness ; currently this is 7 mm compared to 1.3 cm previously. This indicates improvement in the primary lesion.  2. Persistent thoracic scratch that persistent mediastinal adenopathy, roughly similar to the prior exam. 3. New small bilateral pleural effusions with passive atelectasis. 4. Atherosclerosis.   Electronically Signed   By: Van Clines M.D.   On: 03/11/2015 10:29    ASSESSMENT AND PLAN: This is a very pleasant 69 year old white male with history of stage IIIa non-small cell lung cancer status post course of concurrent chemoradiation with weekly carboplatin and paclitaxel and tolerated his treatment fairly well except for the fatigue from the anemia of neoplastic disease. His recent CT scan of the chest showed improvement in the right upper lobe lung mass as well as some of the mediastinal lymphadenopathy. He is now here to begin consolidation chemotherapy with 3 cycles of systemic chemotherapy with carboplatin for AUC of 5 and paclitaxel 175 MG/M2 every 3 weeks with Neulasta support. Adverse effect of the chemotherapy including but not limited to alopecia, myelosuppression, nausea and vomiting, peripheral neuropathy, liver or renal dysfunction.  The patient was seen and examined with Dr. Julien Nordmann. He will proceed with cycle 1 of carboplatin and paclitaxel. He will have weekly labs. He will return in 3 weeks prior to cycle 2 of his  chemotherapy.  He was advised to call immediately if he has any concerning symptoms in the interval.  The patient voices understanding of current disease status and treatment options and is in agreement with the current care plan.  All questions were answered. The patient knows to call the clinic with any problems, questions or concerns. We can certainly see the patient much sooner if necessary.  Mikey Bussing, DNP, AGPCNP-BC, AOCNP    ADDENDUM:  Hematology/Oncology Attending:  I had a face to face encounter with the patient. I recommended his care plan. This is a very pleasant 69 years old white male with a stage IIIa non-small cell lung cancer status post a course of concurrent chemoradiation with weekly carboplatin and paclitaxel with partial response. The patient is here today to start the first cycle of consolidation chemotherapy with carboplatin and paclitaxel every 3 weeks with Neulasta support. His feeling fine today. We'll proceed with the treatment as scheduled. He would come back for follow-up visit in 3 weeks for reevaluation and management of any adverse effect of his treatment. He was advised to call immediately if he has any concerning symptoms in the interval.  Disclaimer: This note was dictated with voice recognition software. Similar sounding words can inadvertently be transcribed and may be missed upon review.  Eilleen Kempf., MD 04/02/2015

## 2015-03-28 NOTE — Patient Instructions (Signed)
[facility] Discharge Instructions for Patients Receiving Chemotherapy  Today you received the following chemotherapy agents Taxol and Carboplatin. To help prevent nausea and vomiting after your treatment, we encourage you to take your nausea medication as directed.  If you develop nausea and vomiting that is not controlled by your nausea medication, call the clinic.   BELOW ARE SYMPTOMS THAT SHOULD BE REPORTED IMMEDIATELY:  *FEVER GREATER THAN 100.5 F  *CHILLS WITH OR WITHOUT FEVER  NAUSEA AND VOMITING THAT IS NOT CONTROLLED WITH YOUR NAUSEA MEDICATION  *UNUSUAL SHORTNESS OF BREATH  *UNUSUAL BRUISING OR BLEEDING  TENDERNESS IN MOUTH AND THROAT WITH OR WITHOUT PRESENCE OF ULCERS  *URINARY PROBLEMS  *BOWEL PROBLEMS  UNUSUAL RASH Items with * indicate a potential emergency and should be followed up as soon as possible.  Feel free to call the clinic you have any questions or concerns. The clinic phone number is [phone].  Please show the CHEMO ALERT CARD at check-in to the Emergency Department and triage nurse.    

## 2015-03-28 NOTE — Telephone Encounter (Signed)
added appt per pof....pt will get print out in tx.

## 2015-03-28 NOTE — Patient Instructions (Signed)

## 2015-03-29 ENCOUNTER — Encounter: Payer: Self-pay | Admitting: Physician Assistant

## 2015-03-29 ENCOUNTER — Ambulatory Visit (INDEPENDENT_AMBULATORY_CARE_PROVIDER_SITE_OTHER): Payer: BLUE CROSS/BLUE SHIELD | Admitting: Physician Assistant

## 2015-03-29 ENCOUNTER — Other Ambulatory Visit: Payer: Self-pay

## 2015-03-29 VITALS — BP 110/62 | HR 107 | Ht 68.0 in | Wt 245.0 lb

## 2015-03-29 DIAGNOSIS — R0602 Shortness of breath: Secondary | ICD-10-CM

## 2015-03-29 DIAGNOSIS — S065XAA Traumatic subdural hemorrhage with loss of consciousness status unknown, initial encounter: Secondary | ICD-10-CM

## 2015-03-29 DIAGNOSIS — I482 Chronic atrial fibrillation, unspecified: Secondary | ICD-10-CM

## 2015-03-29 DIAGNOSIS — I429 Cardiomyopathy, unspecified: Secondary | ICD-10-CM

## 2015-03-29 DIAGNOSIS — I2510 Atherosclerotic heart disease of native coronary artery without angina pectoris: Secondary | ICD-10-CM | POA: Diagnosis not present

## 2015-03-29 DIAGNOSIS — I6200 Nontraumatic subdural hemorrhage, unspecified: Secondary | ICD-10-CM

## 2015-03-29 DIAGNOSIS — R899 Unspecified abnormal finding in specimens from other organs, systems and tissues: Secondary | ICD-10-CM

## 2015-03-29 DIAGNOSIS — C3411 Malignant neoplasm of upper lobe, right bronchus or lung: Secondary | ICD-10-CM

## 2015-03-29 DIAGNOSIS — I5022 Chronic systolic (congestive) heart failure: Secondary | ICD-10-CM | POA: Diagnosis not present

## 2015-03-29 DIAGNOSIS — I10 Essential (primary) hypertension: Secondary | ICD-10-CM

## 2015-03-29 DIAGNOSIS — E785 Hyperlipidemia, unspecified: Secondary | ICD-10-CM

## 2015-03-29 DIAGNOSIS — S065X9A Traumatic subdural hemorrhage with loss of consciousness of unspecified duration, initial encounter: Secondary | ICD-10-CM

## 2015-03-29 LAB — BASIC METABOLIC PANEL
BUN: 23 mg/dL (ref 6–23)
CHLORIDE: 101 meq/L (ref 96–112)
CO2: 26 meq/L (ref 19–32)
Calcium: 9.5 mg/dL (ref 8.4–10.5)
Creatinine, Ser: 1.28 mg/dL (ref 0.40–1.50)
GFR: 59.1 mL/min — ABNORMAL LOW (ref >60.00)
Glucose, Bld: 195 mg/dL — ABNORMAL HIGH (ref 70–99)
Potassium: 4 mEq/L (ref 3.5–5.1)
Sodium: 135 mEq/L (ref 135–145)

## 2015-03-29 LAB — BRAIN NATRIURETIC PEPTIDE: PRO B NATRI PEPTIDE: 543 pg/mL — AB (ref 0.0–100.0)

## 2015-03-29 MED ORDER — CARVEDILOL 6.25 MG PO TABS: 9.3750 mg | ORAL_TABLET | Freq: Two times a day (BID) | ORAL | Status: DC

## 2015-03-29 MED ORDER — FUROSEMIDE 40 MG PO TABS: 60.0000 mg | ORAL_TABLET | Freq: Every day | ORAL | Status: DC

## 2015-03-29 NOTE — Patient Instructions (Signed)
Medication Instructions:  Your physician has recommended you make the following change in your medication:  Increase Coreg 9.375 mg by mouth twice daily.   Labwork: Your physician recommends that you have labs today BMET and BNP    Testing/Procedures: Your physician has requested that you have a lexiscan myoview. For further information please visit [email or web address]. Please follow instruction sheet, as given.    Follow-Up: Your physician recommends that you schedule a follow-up appointment in: 2 to 3 weeks with Dr. [name] or with Richardson Dopp PA on a day when Dr. [name] is in the office.   Any Other Special Instructions Will Be Listed Below (If Applicable).

## 2015-03-29 NOTE — Progress Notes (Signed)
Cardiology Office Note   Date:  03/29/2015   ID:  Alejandro [name], Alejandro [name] 1945/12/14, MRN [id number]  PCP:  [name], MD  Cardiologist:  Dr. [name]     Chief Complaint  Patient presents with  . Congestive Heart Failure     History of Present Illness: Alejandro [name] is a 69 y.o. male with a hx of CAD, status post CABG in 12/2008 (LIMA-LAD, SVG-diagonal, SVG-OM, SVG-PDA) ischemic/nonischemic cardiomyopathy, systolic CHF, HTN, HL, permanent atrial fibrillation. EF has been severely depressed in the past. MRI in 09/2013 demonstrated improved LV function with an EF of 52%  Admitted 04/2014 after falling and hitting his head resulting in a subdural hematoma which required craniotomy and hematoma evacuation. He ultimately underwent reexploration of right craniotomy for partial temporal lobectomy and evacuation of right temporal hematoma with craniectomy. He was taken off anticoagulation and is not felt to be a candidate for future anticoagulation.   He has been diagnosed with stage IIIa non-small cell lung CA status post course of concurrent chemoradiation with carboplatin and paclitaxel.  PCP recently contacted Dr. [name] secondary to hypotension related to cancer treatment. Carvedilol dose was reduced and lisinopril was placed on hold.  The patient was recently seen by primary care. He complained of increased dyspnea with exertion. He had been out of his diuretic for several days. He had evidence of volume excess on exam. Recent CT scan done for surveillance of his malignancy demonstrated bilateral pleural effusions. His Lasix was increased for 3 days. Notes indicate that this was reviewed with cardiology and his follow-up today was arranged. Since that time, the patient has seen Dr. [name] with pulmonology. At that time, his symptoms were felt to be related to COPD and Spiriva was added to his medical regimen. The patient is somewhat of poor historian. He cannot really tell me if his symptoms of  dyspnea with exertion have gotten any better with the treatments as outlined. The patient has noted increased heart rates with minimal activity over the last several weeks. His beta blocker dose was reduced several weeks ago due to hypotension. Patient denies chest discomfort. He describes NYHA 2b-3 symptoms. He sleeps in a recliner due to discomfort. He denies orthopnea. He denies PND. LE edema has resolved. He denies syncope. He does have a chronic cough. He's had some clear to yellow sputum. He denies any significant worsening. He denies recent fevers.   Studies/Reports Reviewed Today:  LHC (12/2008): Left main 95% LAD 90% circumflex marginal 70% EF 25-30%. => CABG  Echo (02/24/13):  Mild LVH, EF 30-35%, mild LAE, mild RVE, mildly reduced RVSF  Cardiac MRI (09/08/13):  EF 52%, no hyper-enhancement or infarct in LV myocardium, mild to moderate LAE, mild MR  Past Medical History  Diagnosis Date  . Systolic heart failure   . 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 Dyslipidemia   . HTN (hypertension)   . Atrial fibrillation   . Tobacco abuse   . Alcohol abuse   . H/O hiatal hernia   . Myocardial infarction   . Dysrhythmia     atrial fib  . Stroke 5/15  . CHF (congestive heart failure)   . Shortness of breath     occ  . GERD (gastroesophageal reflux disease)   . S/P chemotherapy, time since 4-12 weeks   . Cancer     around nose.  New dx of lung cancer    Past Surgical History  Procedure  Laterality Date  . Umbilical hernia repair  2010  . Hernia repair  2012  . Ventral hernia repair N/A 01/22/2013    Procedure: HERNIA REPAIR VENTRAL ADULT;  Surgeon: [name], MD;  Location: [facility];  Service: General;  Laterality: N/A;  . Application of a-cell of chest/abdomen N/A 01/22/2013    Procedure: APPLICATION OF A-CELL OF CHEST/ABDOMEN;  Surgeon: [name], MD;  Location: [facility];  Service: General;  Laterality: N/A;  . Cystoscopy N/A 01/22/2013     Procedure: Consuela Mimes;  Surgeon: [name], MD;  Location: [facility];  Service: Urology;  Laterality: N/A;  Cystoscopy with balloon dilation. Insertion of coude catheter.  . Craniotomy Right 04/19/2014    Procedure: CRANIOTOMY HEMATOMA EVACUATION SUBDURAL;  Surgeon: [name], MD;  Location: [facility];  Service: Neurosurgery;  Laterality: Right;  right  . Craniotomy Right 04/23/2014    Procedure: Craniotomy for Intracerebral Hemorrhage;  Surgeon: [name], MD;  Location: [facility];  Service: Neurosurgery;  Laterality: Right;  . 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 Coronary artery bypass graft  12/24/2008    4 vessel  . Coronary artery bypass graft  2012?  Marland Kitchen Craniotomy N/A 09/13/2014    Procedure: CRANIOTOMY BONE FLAP/PROSTHETIC PLATE;  Surgeon: [name], MD;  Location: [facility];  Service: Neurosurgery;  Laterality: N/A;  . Video bronchoscopy with endobronchial ultrasound N/A 11/09/2014    Procedure: VIDEO BRONCHOSCOPY WITH ENDOBRONCHIAL ULTRASOUND;  Surgeon: Collene Gobble, MD;  Location: [facility];  Service: Thoracic;  Laterality: N/A;     Current Outpatient Prescriptions  Medication Sig Dispense Refill  . albuterol (PROVENTIL HFA;VENTOLIN HFA) 108 (90 BASE) MCG/ACT inhaler Inhale 2 puffs into the lungs every 4 (four) hours as needed for wheezing or shortness of breath. 1 Inhaler 6  . aspirin 81 MG tablet Take 81 mg by mouth daily.    . carvedilol (COREG) 6.25 MG tablet Take 1.5 tablets (9.375 mg total) by mouth 2 (two) times daily with a meal. 90 tablet 3  . FLUoxetine (PROZAC) 20 MG capsule Take 1 capsule (20 mg total) by mouth daily. 30 capsule 3  . furosemide (LASIX) 40 MG tablet Take 1 tablet (40 mg total) by mouth daily. 30 tablet 3  . hyaluronate sodium (RADIAPLEXRX) GEL Apply 1 application topically 2 (two) times  daily.    Marland Kitchen levETIRAcetam (KEPPRA) 500 MG tablet Take 1 tablet (500 mg total) by mouth 2 (two) times daily. 60 tablet 3  . lidocaine-prilocaine (EMLA) cream Apply 1 application topically as needed. 30 g 2  . Multiple Vitamin (MULTIVITAMIN WITH MINERALS) TABS tablet Take 1 tablet by mouth daily.    . pantoprazole (PROTONIX) 40 MG tablet Take 1 tablet (40 mg total) by mouth at bedtime. 30 tablet 5  . PRESCRIPTION MEDICATION Last dose 01/03/15. At the cancer center. Dr Julien Nordmann    . simvastatin (ZOCOR) 40 MG tablet Take 1 tablet (40 mg total) by mouth every evening. 30 tablet 3   No current facility-administered medications for this visit.    Allergies:   Review of patient's allergies indicates no known allergies.    Social History:  The patient  reports that he quit smoking about 23 months ago. His smoking use included Cigarettes. He has a 50 pack-year smoking history. He has never  used smokeless tobacco. He reports that he does not drink alcohol or use illicit drugs.   Family History:  The patient's family history includes Diabetes in his father and mother; Heart attack in his brother, father, and mother; Heart disease in his brother, father, and mother; Rheum arthritis in his daughter. There is no history of Colon cancer, Prostate cancer, or Stroke.    ROS:   Please see the history of present illness.   Review of Systems  Constitution: Positive for malaise/fatigue.  HENT: Positive for hearing loss.   Cardiovascular: Positive for dyspnea on exertion and irregular heartbeat.  Respiratory: Positive for cough and wheezing.   Hematologic/Lymphatic: Bruises/bleeds easily.  Musculoskeletal: Positive for back pain.  Gastrointestinal: Positive for constipation.  Neurological: Positive for dizziness and loss of balance.  All other systems reviewed and are negative.    PHYSICAL EXAM: VS:  BP 110/62 mmHg  Pulse 107  Ht '5\' 8"'$  (1.727 m)  Wt 245 lb (111.131 kg)  BMI 37.26 kg/m2    Wt Readings  from Last 3 Encounters:  03/29/15 245 lb (111.131 kg)  03/28/15 245 lb 14.4 oz (111.54 kg)  03/22/15 250 lb (113.399 kg)     GEN: Well nourished, well developed, in no acute distress HEENT: normal Neck: no JVD,  no masses Cardiac:  Normal S1/S2, irregularly irregular rhythm; no murmur ,  no rubs or gallops, no edema  Respiratory:  Decreased breath sounds bilaterally, no wheezing, rhonchi or rales. GI: soft, nontender, nondistended, + BS MS: no deformity or atrophy Skin: warm and dry  Neuro:  CNs II-XII intact, Strength and sensation are intact Psych: Normal affect   EKG:  EKG is ordered today.  It demonstrates:   Atrial fibrillation, HR 107   Recent Labs: 04/30/2014: Magnesium 2.5 03/28/2015: ALT 21; BUN 21.0; Creatinine 1.3; Hemoglobin 11.4*; Platelets 189; Potassium 3.5; Sodium 138    Lipid Panel    Component Value Date/Time   CHOL 120 09/23/2014 0922   TRIG 174.0* 09/23/2014 0922   HDL 29.60* 09/23/2014 0922   CHOLHDL 4 09/23/2014 0922   VLDL 34.8 09/23/2014 0922   LDLCALC 56 09/23/2014 0922      ASSESSMENT AND PLAN:  SOB (shortness of breath) Etiology of his shortness of breath is not entirely clear. His heart rate is uncontrolled. Question if his shortness of breath is related to uncontrolled heart rate or vice versa.  His uncontrolled heart rate could be due to the recent decrease in his rate controlling medication versus another underlying cause.  Recent hemoglobin was stable. BUN and creatinine was also stable. He doesn't really have any signs or symptoms of infection.  He has not had an ischemic evaluation since his bypass in 2010.  -  Increase carvedilol back to 9.375 mg twice a day  -  Obtain basic metabolic panel, BNP  -  Obtain Lexiscan Myoview.  SYSTOLIC HEART FAILURE, CHRONIC Volume appears stable. Continue current dose of Lasix. Check BNP. Adjust Lasix if BNP significantly elevated.  Cardiomyopathy - Mixed Ischemic/Non-Ischemic (EF 52% by MRI  09/2013) EF improved to 52% by MRI in 2014. ACE inhibitor currently on hold secondary to hypotension. Continue beta blocker.  Coronary artery disease  Continue aspirin, beta blocker, statin. Proceed with Myoview as noted.  Chronic atrial fibrillation Rate uncontrolled. Adjust beta blocker as noted. With his hypotension, we may need to consider adding digoxin for better rate control.  Essential hypertension Blood pressure now running low.  Hyperlipidemia Continue statin.  Subdural hematoma He is not  a candidate for oral anticoagulation.  Non-small cell carcinoma of lung, stage 3  Continue follow-up with oncology.  Current medicines are reviewed at length with the patient today.  Concerns regarding medicines are as outlined above.  The following changes have been made:    Increase carvedilol to 9.375 mg twice a day   Labs/ tests ordered today include:  Orders Placed This Encounter  Procedures  . Basic Metabolic Panel (BMET)  . B Nat Peptide  . Myocardial Perfusion Imaging  . EKG 12-Lead    Disposition:   FU with Dr. [name] or me in 2-3 weeks.   Signed, Versie Starks, MHS 03/29/2015 2:34 PM    [address], [place], [place]  [address] Phone: [phone]; Fax: [phone]

## 2015-03-30 ENCOUNTER — Telehealth: Payer: Self-pay | Admitting: *Deleted

## 2015-03-30 ENCOUNTER — Ambulatory Visit (HOSPITAL_BASED_OUTPATIENT_CLINIC_OR_DEPARTMENT_OTHER): Payer: BLUE CROSS/BLUE SHIELD

## 2015-03-30 VITALS — BP 131/61 | HR 99 | Temp 98.3°F

## 2015-03-30 DIAGNOSIS — C3411 Malignant neoplasm of upper lobe, right bronchus or lung: Secondary | ICD-10-CM | POA: Diagnosis not present

## 2015-03-30 DIAGNOSIS — Z5189 Encounter for other specified aftercare: Secondary | ICD-10-CM | POA: Diagnosis not present

## 2015-03-30 MED ORDER — PEGFILGRASTIM INJECTION 6 MG/0.6ML ~~LOC~~
6.0000 mg | PREFILLED_SYRINGE | Freq: Once | SUBCUTANEOUS | Status: AC
  Administered 2015-03-30: 6 mg via SUBCUTANEOUS
  Filled 2015-03-30: qty 0.6

## 2015-03-30 NOTE — Patient Instructions (Signed)
Pegfilgrastim injection What is this medicine? PEGFILGRASTIM (peg fil GRA stim) is a long-acting granulocyte colony-stimulating factor that stimulates the growth of neutrophils, a type of white blood cell important in the body's fight against infection. It is used to reduce the incidence of fever and infection in patients with certain types of cancer who are receiving chemotherapy that affects the bone marrow. This medicine may be used for other purposes; ask your health care provider or pharmacist if you have questions. COMMON BRAND NAME(S): Neulasta What should I tell my health care provider before I take this medicine? They need to know if you have any of these conditions: -latex allergy -ongoing radiation therapy -sickle cell disease -skin reactions to acrylic adhesives (On-Body Injector only) -an unusual or allergic reaction to pegfilgrastim, filgrastim, other medicines, foods, dyes, or preservatives -pregnant or trying to get pregnant -breast-feeding How should I use this medicine? This medicine is for injection under the skin. If you get this medicine at home, you will be taught how to prepare and give the pre-filled syringe or how to use the On-body Injector. Refer to the patient Instructions for Use for detailed instructions. Use exactly as directed. Take your medicine at regular intervals. Do not take your medicine more often than directed. It is important that you put your used needles and syringes in a special sharps container. Do not put them in a trash can. If you do not have a sharps container, call your pharmacist or healthcare provider to get one. Talk to your pediatrician regarding the use of this medicine in children. Special care may be needed. Overdosage: If you think you have taken too much of this medicine contact a poison control center or emergency room at once. NOTE: This medicine is only for you. Do not share this medicine with others. What if I miss a dose? It is  important not to miss your dose. Call your doctor or health care professional if you miss your dose. If you miss a dose due to an On-body Injector failure or leakage, a new dose should be administered as soon as possible using a single prefilled syringe for manual use. What may interact with this medicine? Interactions have not been studied. Give your health care provider a list of all the medicines, herbs, non-prescription drugs, or dietary supplements you use. Also tell them if you smoke, drink alcohol, or use illegal drugs. Some items may interact with your medicine. This list may not describe all possible interactions. Give your health care provider a list of all the medicines, herbs, non-prescription drugs, or dietary supplements you use. Also tell them if you smoke, drink alcohol, or use illegal drugs. Some items may interact with your medicine. What should I watch for while using this medicine? You may need blood work done while you are taking this medicine. If you are going to need a MRI, CT scan, or other procedure, tell your doctor that you are using this medicine (On-Body Injector only). What side effects may I notice from receiving this medicine? Side effects that you should report to your doctor or health care professional as soon as possible: -allergic reactions like skin rash, itching or hives, swelling of the face, lips, or tongue -dizziness -fever -pain, redness, or irritation at site where injected -pinpoint red spots on the skin -shortness of breath or breathing problems -stomach or side pain, or pain at the shoulder -swelling -tiredness -trouble passing urine Side effects that usually do not require medical attention (report to your doctor   or health care professional if they continue or are bothersome): -bone pain -muscle pain This list may not describe all possible side effects. Call your doctor for medical advice about side effects. You may report side effects to FDA at  1-800-FDA-1088. Where should I keep my medicine? Keep out of the reach of children. Store pre-filled syringes in a refrigerator between 2 and 8 degrees C (36 and 46 degrees F). Do not freeze. Keep in carton to protect from light. Throw away this medicine if it is left out of the refrigerator for more than 48 hours. Throw away any unused medicine after the expiration date. NOTE: This sheet is a summary. It may not cover all possible information. If you have questions about this medicine, talk to your doctor, pharmacist, or health care provider.  2015, [employer] Standard. (2014-02-18 16:14:05)  

## 2015-03-30 NOTE — Telephone Encounter (Signed)
Drayke here for Neulasta injection following 1st taxol/carbo chemotherapy.  States that he is doing well.  No nausea, vomiting or diarrhea.  Is drinking fluids and eating without problems.  All questions answered.  Knows to call if he has any problems or concerns.

## 2015-04-02 ENCOUNTER — Other Ambulatory Visit: Payer: Self-pay | Admitting: Internal Medicine

## 2015-04-04 ENCOUNTER — Telehealth: Payer: Self-pay | Admitting: Nurse Practitioner

## 2015-04-04 ENCOUNTER — Ambulatory Visit (HOSPITAL_BASED_OUTPATIENT_CLINIC_OR_DEPARTMENT_OTHER): Payer: BLUE CROSS/BLUE SHIELD | Admitting: Nurse Practitioner

## 2015-04-04 ENCOUNTER — Telehealth (HOSPITAL_COMMUNITY): Payer: Self-pay | Admitting: *Deleted

## 2015-04-04 ENCOUNTER — Ambulatory Visit: Payer: BLUE CROSS/BLUE SHIELD | Admitting: Internal Medicine

## 2015-04-04 ENCOUNTER — Ambulatory Visit: Payer: BLUE CROSS/BLUE SHIELD

## 2015-04-04 ENCOUNTER — Other Ambulatory Visit: Payer: Self-pay | Admitting: Nurse Practitioner

## 2015-04-04 ENCOUNTER — Other Ambulatory Visit (HOSPITAL_BASED_OUTPATIENT_CLINIC_OR_DEPARTMENT_OTHER): Payer: BLUE CROSS/BLUE SHIELD

## 2015-04-04 ENCOUNTER — Other Ambulatory Visit: Payer: Self-pay | Admitting: Oncology

## 2015-04-04 VITALS — BP 108/64 | HR 66 | Temp 98.5°F | Resp 18

## 2015-04-04 DIAGNOSIS — L299 Pruritus, unspecified: Secondary | ICD-10-CM

## 2015-04-04 DIAGNOSIS — C3411 Malignant neoplasm of upper lobe, right bronchus or lung: Secondary | ICD-10-CM

## 2015-04-04 DIAGNOSIS — Z95828 Presence of other vascular implants and grafts: Secondary | ICD-10-CM

## 2015-04-04 DIAGNOSIS — R21 Rash and other nonspecific skin eruption: Secondary | ICD-10-CM

## 2015-04-04 DIAGNOSIS — C3492 Malignant neoplasm of unspecified part of left bronchus or lung: Secondary | ICD-10-CM

## 2015-04-04 LAB — COMPREHENSIVE METABOLIC PANEL (CC13)
ALT: 26 U/L (ref 0–55)
ANION GAP: 11 meq/L (ref 3–11)
AST: 27 U/L (ref 5–34)
Albumin: 3.7 g/dL (ref 3.5–5.0)
Alkaline Phosphatase: 84 U/L (ref 40–150)
BILIRUBIN TOTAL: 0.9 mg/dL (ref 0.20–1.20)
BUN: 28.9 mg/dL — AB (ref 7.0–26.0)
CO2: 24 mEq/L (ref 22–29)
CREATININE: 1.3 mg/dL (ref 0.7–1.3)
Calcium: 9.2 mg/dL (ref 8.4–10.4)
Chloride: 100 mEq/L (ref 98–109)
EGFR: 58 mL/min/{1.73_m2} — AB (ref >90)
GLUCOSE: 196 mg/dL — AB (ref 70–140)
Potassium: 3.2 mEq/L — ABNORMAL LOW (ref 3.5–5.1)
Sodium: 135 mEq/L — ABNORMAL LOW (ref 136–145)
TOTAL PROTEIN: 7.1 g/dL (ref 6.4–8.3)

## 2015-04-04 LAB — CBC WITH DIFFERENTIAL/PLATELET
BASO%: 0 % (ref 0.0–2.0)
Basophils Absolute: 0 10*3/uL (ref 0.0–0.1)
EOS%: 3.8 % (ref 0.0–7.0)
Eosinophils Absolute: 0.1 10*3/uL (ref 0.0–0.5)
HCT: 34.1 % — ABNORMAL LOW (ref 38.4–49.9)
HGB: 11.7 g/dL — ABNORMAL LOW (ref 13.0–17.1)
LYMPH#: 0.4 10*3/uL — AB (ref 0.9–3.3)
LYMPH%: 24.8 % (ref 14.0–49.0)
MCH: 31.7 pg (ref 27.2–33.4)
MCHC: 34.3 g/dL (ref 32.0–36.0)
MCV: 92.4 fL (ref 79.3–98.0)
MONO#: 0.4 10*3/uL (ref 0.1–0.9)
MONO%: 24.8 % — AB (ref 0.0–14.0)
NEUT#: 0.7 10*3/uL — ABNORMAL LOW (ref 1.5–6.5)
NEUT%: 46.6 % (ref 39.0–75.0)
PLATELETS: 116 10*3/uL — AB (ref 140–400)
RBC: 3.69 10*6/uL — ABNORMAL LOW (ref 4.20–5.82)
RDW: 15.1 % — ABNORMAL HIGH (ref 11.0–14.6)
WBC: 1.6 10*3/uL — ABNORMAL LOW (ref 4.0–10.3)

## 2015-04-04 MED ORDER — POTASSIUM CHLORIDE CRYS ER 20 MEQ PO TBCR: 20.0000 meq | EXTENDED_RELEASE_TABLET | Freq: Every day | ORAL | Status: DC

## 2015-04-04 MED ORDER — SODIUM CHLORIDE 0.9 % IJ SOLN
10.0000 mL | INTRAMUSCULAR | Status: DC | PRN
  Administered 2015-04-04: 10 mL via INTRAVENOUS
  Filled 2015-04-04: qty 10

## 2015-04-04 MED ORDER — HEPARIN SOD (PORK) LOCK FLUSH 100 UNIT/ML IV SOLN
500.0000 [IU] | Freq: Once | INTRAVENOUS | Status: AC
Start: 2015-04-04 — End: 2015-04-04
  Administered 2015-04-04: 500 [IU] via INTRAVENOUS
  Filled 2015-04-04: qty 5

## 2015-04-04 MED ORDER — METHYLPREDNISOLONE 4 MG PO TBPK: ORAL_TABLET | ORAL | Status: DC

## 2015-04-04 NOTE — Progress Notes (Signed)
Pt in today for lab and flush.  Pt and wife mention that since he received his neulasta shot on Wednesday March 30, 2015 that he has developed a rash on both arms.  States that the rash itches really bad.  Drue Second, NP made aware of pt concerns and states that she will see him to assess his arms.  Labs obtained via port a cath without difficulty.  Pt PAC flushed with saline and heparin. Pt left accessed in order for Drue Second, NP to assess him.  Pt and wife instructed to have needle removed from [facility] before leaving the cancer center.  Pt and wife verbalize understanding.  Riesa Pope, RN notified that pt was still accessed but had been flushed with saline and heparin and that pt should be deaccessed before leaving the cancer center.

## 2015-04-04 NOTE — Telephone Encounter (Signed)
Patient given detailed instructions per Myocardial Perfusion Study Information Sheet for test on 04/05/15 at 1145. Patient verbalized understanding. Alejandro [name], Ranae Palms

## 2015-04-04 NOTE — Patient Instructions (Signed)

## 2015-04-04 NOTE — Telephone Encounter (Signed)
Unable to get in contact with Dr. Windy Carina nurse. LM on her voicemail on 03/24/2015 and still have not received call back.

## 2015-04-04 NOTE — Telephone Encounter (Signed)
Pt is in the office scheduled labs/flush and with NP/CB......Marland Kitchen KJ

## 2015-04-05 ENCOUNTER — Other Ambulatory Visit (INDEPENDENT_AMBULATORY_CARE_PROVIDER_SITE_OTHER): Payer: BLUE CROSS/BLUE SHIELD | Admitting: *Deleted

## 2015-04-05 ENCOUNTER — Telehealth: Payer: Self-pay

## 2015-04-05 ENCOUNTER — Ambulatory Visit (HOSPITAL_COMMUNITY): Payer: BLUE CROSS/BLUE SHIELD | Attending: Internal Medicine

## 2015-04-05 DIAGNOSIS — R6889 Other general symptoms and signs: Secondary | ICD-10-CM

## 2015-04-05 DIAGNOSIS — I2510 Atherosclerotic heart disease of native coronary artery without angina pectoris: Secondary | ICD-10-CM | POA: Diagnosis not present

## 2015-04-05 DIAGNOSIS — R9439 Abnormal result of other cardiovascular function study: Secondary | ICD-10-CM | POA: Diagnosis not present

## 2015-04-05 DIAGNOSIS — R899 Unspecified abnormal finding in specimens from other organs, systems and tissues: Secondary | ICD-10-CM

## 2015-04-05 DIAGNOSIS — R0602 Shortness of breath: Secondary | ICD-10-CM | POA: Insufficient documentation

## 2015-04-05 LAB — BASIC METABOLIC PANEL
BUN: 25 mg/dL — ABNORMAL HIGH (ref 6–23)
CHLORIDE: 98 meq/L (ref 96–112)
CO2: 27 mEq/L (ref 19–32)
Calcium: 9.5 mg/dL (ref 8.4–10.5)
Creatinine, Ser: 1.21 mg/dL (ref 0.40–1.50)
GFR: 63.06 mL/min (ref >60.00)
Glucose, Bld: 145 mg/dL — ABNORMAL HIGH (ref 70–99)
Potassium: 3.9 mEq/L (ref 3.5–5.1)
Sodium: 133 mEq/L — ABNORMAL LOW (ref 135–145)

## 2015-04-05 LAB — BRAIN NATRIURETIC PEPTIDE: Pro B Natriuretic peptide (BNP): 252 pg/mL — ABNORMAL HIGH (ref 0.0–100.0)

## 2015-04-05 MED ORDER — REGADENOSON 0.4 MG/5ML IV SOLN
0.4000 mg | Freq: Once | INTRAVENOUS | Status: AC
  Administered 2015-04-05: 0.4 mg via INTRAVENOUS

## 2015-04-05 MED ORDER — TECHNETIUM TC 99M SESTAMIBI GENERIC - CARDIOLITE
10.0000 | Freq: Once | INTRAVENOUS | Status: AC | PRN
  Administered 2015-04-05: 10 via INTRAVENOUS

## 2015-04-05 MED ORDER — TECHNETIUM TC 99M SESTAMIBI GENERIC - CARDIOLITE
30.0000 | Freq: Once | INTRAVENOUS | Status: AC | PRN
  Administered 2015-04-05: 30 via INTRAVENOUS

## 2015-04-05 NOTE — Addendum Note (Signed)
Addended by: Eulis Foster on: 04/05/2015 11:34 AM   Modules accepted: Orders

## 2015-04-05 NOTE — Telephone Encounter (Signed)
Pt stated the solumedrol is starting to work some on his rash and itching. Instructed to use to the end of dose pack. Call if any other issues.

## 2015-04-06 ENCOUNTER — Encounter: Payer: Self-pay | Admitting: Nurse Practitioner

## 2015-04-06 ENCOUNTER — Telehealth: Payer: Self-pay

## 2015-04-06 DIAGNOSIS — R21 Rash and other nonspecific skin eruption: Secondary | ICD-10-CM | POA: Insufficient documentation

## 2015-04-06 LAB — MYOCARDIAL PERFUSION IMAGING
CHL CUP NUCLEAR SDS: 4
CHL CUP NUCLEAR SRS: 14
CHL CUP NUCLEAR SSS: 18
CHL CUP RESTING HR STRESS: 110 {beats}/min
CHL CUP STRESS STAGE 1 SPEED: 0 mph
CHL CUP STRESS STAGE 2 GRADE: 0 %
CHL CUP STRESS STAGE 2 HR: 100 {beats}/min
CHL CUP STRESS STAGE 3 SPEED: 0 mph
CHL CUP STRESS STAGE 4 GRADE: 0 %
CHL CUP STRESS STAGE 4 SPEED: 0 mph
CHL CUP STRESS STAGE 5 DBP: 62 mmHg
CHL CUP STRESS STAGE 5 SPEED: 0 mph
CHL CUP STRESS STAGE 6 DBP: 59 mmHg
CSEPPMHR: 68 %
Estimated workload: 1 METS
LV dias vol: 164 mL
LV sys vol: 125 mL
NUC STRESS EF: 24 %
NUC STRESS TID: 0.99
Peak HR: 104 {beats}/min
RATE: 0.4
Stage 1 DBP: 60 mmHg
Stage 1 Grade: 0 %
Stage 1 HR: 97 {beats}/min
Stage 1 SBP: 89 mmHg
Stage 2 Speed: 0 mph
Stage 3 DBP: 66 mmHg
Stage 3 Grade: 0 %
Stage 3 HR: 103 {beats}/min
Stage 3 SBP: 109 mmHg
Stage 4 HR: 104 {beats}/min
Stage 5 Grade: 0 %
Stage 5 HR: 108 {beats}/min
Stage 5 SBP: 92 mmHg
Stage 6 Grade: 0 %
Stage 6 HR: 110 {beats}/min
Stage 6 SBP: 93 mmHg
Stage 6 Speed: 0 mph

## 2015-04-06 NOTE — Progress Notes (Signed)
SYMPTOM MANAGEMENT CLINIC   HPI: Alejandro [name] 68 y.o. male diagnosed with lung cancer.  Currently undergoing carboplatin/paclitaxel chemotherapy regimen.  Patient received his first cycle of carboplatin/paclitaxel chemotherapy on 03/23/2015.  He received his first and last injection for growth factor support on 03/30/2015.  Paragraph he reports developing a pruritic rash within approximately 4-36 hours of his initial chemotherapy.  He is tried no over-the-counter medications so far.  He denies any other hypersensitivity reaction symptoms whatsoever.  He denies any recent fevers or chills.   HPI  ROS  Past Medical History  Diagnosis Date  . Systolic heart failure   . 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 Dyslipidemia   . HTN (hypertension)   . Atrial fibrillation   . Tobacco abuse   . Alcohol abuse   . H/O hiatal hernia   . Myocardial infarction   . Dysrhythmia     atrial fib  . Stroke 5/15  . CHF (congestive heart failure)   . Shortness of breath     occ  . GERD (gastroesophageal reflux disease)   . S/P chemotherapy, time since 4-12 weeks   . Cancer     around nose.  New dx of lung cancer    Past Surgical History  Procedure Laterality Date  . Umbilical hernia repair  2010  . Hernia repair  2012  . Ventral hernia repair N/A 01/22/2013    Procedure: HERNIA REPAIR VENTRAL ADULT;  Surgeon: [name], MD;  Location: [place];  Service: General;  Laterality: N/A;  . Application of a-cell of chest/abdomen N/A 01/22/2013    Procedure: APPLICATION OF A-CELL OF CHEST/ABDOMEN;  Surgeon: [name], MD;  Location: [place];  Service: General;  Laterality: N/A;  . Cystoscopy N/A 01/22/2013    Procedure: Consuela Mimes;  Surgeon: [name], MD;  Location: [place];  Service: Urology;  Laterality: N/A;  Cystoscopy with balloon dilation. Insertion of coude catheter.  . Craniotomy Right 04/19/2014    Procedure: CRANIOTOMY HEMATOMA EVACUATION SUBDURAL;   Surgeon: [name], MD;  Location: [place] [facility];  Service: Neurosurgery;  Laterality: Right;  right  . Craniotomy Right 04/23/2014    Procedure: Craniotomy for Intracerebral Hemorrhage;  Surgeon: [name], MD;  Location: [place] [facility];  Service: Neurosurgery;  Laterality: Right;  . 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 Coronary artery bypass graft  12/24/2008    4 vessel  . Coronary artery bypass graft  2012?  Marland Kitchen Craniotomy N/A 09/13/2014    Procedure: CRANIOTOMY BONE FLAP/PROSTHETIC PLATE;  Surgeon: [name], MD;  Location: [place] [facility];  Service: Neurosurgery;  Laterality: N/A;  . Video bronchoscopy with endobronchial ultrasound N/A 11/09/2014    Procedure: VIDEO BRONCHOSCOPY WITH ENDOBRONCHIAL ULTRASOUND;  Surgeon: Collene Gobble, MD;  Location: [place];  Service: Thoracic;  Laterality: N/A;    has HYPERLIPIDEMIA-MIXED; Coronary atherosclerosis of native coronary artery; ATRIAL FIBRILLATION; SYSTOLIC HEART FAILURE, CHRONIC; SYSTOLIC HEART FAILURE, ACUTE ON CHRONIC; Skin lesion; Hernia; Recurrent ventral hernia; Encounter for therapeutic drug monitoring; SDH (subdural hematoma); Accelerated hypertension; Alcohol abuse; Edema; Anxiety state, unspecified; Intracerebral mass; Status post craniotomy; History of cranioplasty; Nodule of right lung; COPD mixed type; Hepatic cirrhosis; Non-small cell carcinoma of lung, stage 3; and Rash on his problem list.  has No Known Allergies.    Medication List       This list is accurate as of: 04/04/15 11:59 PM.  Always use your most recent med list.               albuterol 108 (90 BASE) MCG/ACT inhaler  Commonly known as:  PROVENTIL HFA;VENTOLIN HFA  Inhale 2 puffs into the lungs every 4 (four) hours as needed for wheezing or shortness of breath.     aspirin 81 MG tablet  Take 81 mg by mouth  daily.     carvedilol 6.25 MG tablet  Commonly known as:  COREG  Take 1.5 tablets (9.375 mg total) by mouth 2 (two) times daily with a meal.     FLUoxetine 20 MG capsule  Commonly known as:  PROZAC  Take 1 capsule (20 mg total) by mouth daily.     furosemide 40 MG tablet  Commonly known as:  LASIX  Take 1.5 tablets (60 mg total) by mouth daily.     hyaluronate sodium Gel  Apply 1 application topically 2 (two) times daily.     levETIRAcetam 500 MG tablet  Commonly known as:  KEPPRA  Take 1 tablet (500 mg total) by mouth 2 (two) times daily.     lidocaine-prilocaine cream  Commonly known as:  EMLA  Apply 1 application topically as needed.     methylPREDNISolone 4 MG Tbpk tablet  Commonly known as:  MEDROL DOSEPAK  Medrol dose pak- take as directed.     multivitamin with minerals Tabs tablet  Take 1 tablet by mouth daily.     pantoprazole 40 MG tablet  Commonly known as:  PROTONIX  Take 1 tablet (40 mg total) by mouth at bedtime.     potassium chloride SA 20 MEQ tablet  Commonly known as:  K-DUR,KLOR-CON  Take 1 tablet (20 mEq total) by mouth daily.     PRESCRIPTION MEDICATION  Last dose 01/03/15. At the cancer center. Dr Julien Nordmann     simvastatin 40 MG tablet  Commonly known as:  ZOCOR  Take 1 tablet (40 mg total) by mouth every evening.         PHYSICAL EXAMINATION  Oncology Vitals 04/05/2015 04/04/2015 03/30/2015 03/29/2015 03/28/2015 03/22/2015 03/21/2015  Height 173 cm - - 173 cm 173 cm 173 cm 173 cm  Weight 107.502 kg - - 111.131 kg 111.54 kg 113.399 kg 113.399 kg  Weight (lbs) 237 lbs - - 245 lbs 245 lbs 14 oz 250 lbs 250 lbs  BMI (kg/m2) 36.04 kg/m2 - - 37.25 kg/m2 37.39 kg/m2 38.01 kg/m2 38.01 kg/m2  Temp - 98.5 98.3 - 98 - 98.1  Pulse - 66 99 107 87 94 128  Resp - 18 - - 19 - -  SpO2 - - - - 96 98 97  BSA (m2) 2.27 m2 - - 2.31 m2 2.31 m2 2.33 m2 2.33 m2   BP Readings from Last 3 Encounters:  04/04/15 108/64  03/30/15 131/61  03/29/15 110/62    Physical  Exam  Constitutional: He is oriented to person, place, and time and well-developed, well-nourished, and in no distress.  HENT:  Head: Normocephalic and atraumatic.  Mouth/Throat: Oropharynx is clear and moist.  Eyes: Conjunctivae and EOM are normal. Pupils are equal, round, and reactive to light. Right eye exhibits no discharge. Left eye exhibits no discharge. No scleral icterus.  Neck: Normal range of motion. Neck supple. No JVD present. No tracheal deviation present. No thyromegaly present.  Cardiovascular: Normal  rate, regular rhythm, normal heart sounds and intact distal pulses.   Pulmonary/Chest: Effort normal and breath sounds normal. No stridor. No respiratory distress. He has no wheezes. He has no rales. He exhibits no tenderness.  Abdominal: Soft. Bowel sounds are normal. He exhibits mass. He exhibits no distension. There is no tenderness. There is no rebound and no guarding.  The patient has a chronic right groin inguinal hernia that is reducible.  Musculoskeletal: Normal range of motion. He exhibits no edema or tenderness.  Lymphadenopathy:    He has no cervical adenopathy.  Neurological: He is alert and oriented to person, place, and time. Gait normal.  Skin: Skin is warm and dry. Rash noted. No erythema. No pallor.  Patient has a papular rash to his bilateral forearms and trunk.  It does appear the patient has been scratching at the rash; and he has multiple scabbed areas mouth no evidence of active infection noted.  Psychiatric: Affect normal.  Nursing note and vitals reviewed.   LABORATORY DATA:. Appointment on 04/04/2015  Component Date Value Ref Range Status  . WBC 04/04/2015 1.6* 4.0 - 10.3 10e3/uL Final  . NEUT# 04/04/2015 0.7* 1.5 - 6.5 10e3/uL Final  . HGB 04/04/2015 11.7* 13.0 - 17.1 g/dL Final  . HCT 04/04/2015 34.1* 38.4 - 49.9 % Final  . Platelets 04/04/2015 116* 140 - 400 10e3/uL Final  . MCV 04/04/2015 92.4  79.3 - 98.0 fL Final  . MCH 04/04/2015 31.7  27.2 -  33.4 pg Final  . MCHC 04/04/2015 34.3  32.0 - 36.0 g/dL Final  . RBC 04/04/2015 3.69* 4.20 - 5.82 10e6/uL Final  . RDW 04/04/2015 15.1* 11.0 - 14.6 % Final  . lymph# 04/04/2015 0.4* 0.9 - 3.3 10e3/uL Final  . MONO# 04/04/2015 0.4  0.1 - 0.9 10e3/uL Final  . Eosinophils Absolute 04/04/2015 0.1  0.0 - 0.5 10e3/uL Final  . Basophils Absolute 04/04/2015 0.0  0.0 - 0.1 10e3/uL Final  . NEUT% 04/04/2015 46.6  39.0 - 75.0 % Final  . LYMPH% 04/04/2015 24.8  14.0 - 49.0 % Final  . MONO% 04/04/2015 24.8* 0.0 - 14.0 % Final  . EOS% 04/04/2015 3.8  0.0 - 7.0 % Final  . BASO% 04/04/2015 0.0  0.0 - 2.0 % Final  . Sodium 04/04/2015 135* 136 - 145 mEq/L Final  . Potassium 04/04/2015 3.2* 3.5 - 5.1 mEq/L Final  . Chloride 04/04/2015 100  98 - 109 mEq/L Final  . CO2 04/04/2015 24  22 - 29 mEq/L Final  . Glucose 04/04/2015 196* 70 - 140 mg/dl Final  . BUN 04/04/2015 28.9* 7.0 - 26.0 mg/dL Final  . Creatinine 04/04/2015 1.3  0.7 - 1.3 mg/dL Final  . Total Bilirubin 04/04/2015 0.90  0.20 - 1.20 mg/dL Final  . Alkaline Phosphatase 04/04/2015 84  40 - 150 U/L Final  . AST 04/04/2015 27  5 - 34 U/L Final  . ALT 04/04/2015 26  0 - 55 U/L Final  . Total Protein 04/04/2015 7.1  6.4 - 8.3 g/dL Final  . Albumin 04/04/2015 3.7  3.5 - 5.0 g/dL Final  . Calcium 04/04/2015 9.2  8.4 - 10.4 mg/dL Final  . Anion Gap 04/04/2015 11  3 - 11 mEq/L Final  . EGFR 04/04/2015 58* >90 ml/min/1.73 m2 Final   eGFR is calculated using the CKD-EPI Creatinine Equation (2009)     RADIOGRAPHIC STUDIES: No results found.  ASSESSMENT/PLAN:    Non-small cell carcinoma of lung, stage 3 Pt received cycle 1, day 1 of  initial cycle carboplatin/paclitaxel on 03/28/15.  Patient received his initial Neulasta injection on 03/30/2015.  Patient is scheduled for his next chemotherapy on 04/18/2015.   Rash Patient received his initial cycle of carboplatin/paclitaxel on 03/28/2015.  He received his first Neulasta injection for growth factor  support on 03/30/2015.  Approximate 24 hours after receiving his initial chemotherapy patient developed a papular, pruritic rash to his bilateral forearms; as well as to his trunk.  Patient admits to scratching at the rash; causing the rash to be, scabbed.  Most likely, rash is secondary to chemotherapy.  Patient was advised to take Benadryl 25 mg every 6 hours and Pepcid 20 mg every 12 hours.  He was also prescribed a Medrol Dosepak as well.     Patient stated understanding of all instructions; and was in agreement with this plan of care. The patient knows to call the clinic with any problems, questions or concerns.   This was a shared visit with Dr. Julien Nordmann today.   Total time spent with patient was 25 minutes;  with greater than 75 percent of that time spent in face to face counseling regarding patient's symptoms,  and coordination of care and follow up.  Disclaimer: This note was dictated with voice recognition software. Similar sounding words can inadvertently be transcribed and may not be corrected upon review.   Drue Second, NP 04/06/2015   ADDENDUM: Hematology/Oncology Attending: I had a face to face encounter with the patient. I recommended his care plan. He is a very pleasant 69 years old white male with a stage IIIa non-small cell lung cancer status post course of concurrent chemoradiation and he recently started the first cycle of consolidation chemotherapy was carboplatin and paclitaxel every 3 years with Neulasta support. He came today for evaluation and recommendation regarding this abnormality. The patient is feeling fine otherwise with no specific complaints. I recommended for him to start Medrol Dosepak in addition to Benadryl and Pepcid on as-needed basis. He would come back for follow-up visit in 2 weeks for reevaluation before starting cycle #2 of his systemic chemotherapy. The patient was advised to call immediately if he has any concerning symptoms in the  interval.  Disclaimer: This note was dictated with voice recognition software. Similar sounding words can inadvertently be transcribed and may be missed upon review. Eilleen Kempf., MD 04/07/2015

## 2015-04-06 NOTE — Assessment & Plan Note (Signed)
Pt received cycle 1, day 1 of initial cycle carboplatin/paclitaxel on 03/28/15.  Patient received his initial Neulasta injection on 03/30/2015.  Patient is scheduled for his next chemotherapy on 04/18/2015.

## 2015-04-06 NOTE — Telephone Encounter (Signed)
S/w wife, DPR on file, regarding test results. Wife verbalized understanding and indicated she had no questions.

## 2015-04-06 NOTE — Assessment & Plan Note (Addendum)
Patient received his initial cycle of carboplatin/paclitaxel on 03/28/2015.  He received his first Neulasta injection for growth factor support on 03/30/2015.  Approximate 24 hours after receiving his initial chemotherapy patient developed a papular, pruritic rash to his bilateral forearms; as well as to his trunk.  Patient admits to scratching at the rash; causing the rash to be, scabbed.  Most likely, rash is secondary to chemotherapy.  Patient was advised to take Benadryl 25 mg every 6 hours and Pepcid 20 mg every 12 hours.  He was also prescribed a Medrol Dosepak as well.

## 2015-04-08 ENCOUNTER — Telehealth: Payer: Self-pay | Admitting: *Deleted

## 2015-04-08 DIAGNOSIS — I5022 Chronic systolic (congestive) heart failure: Secondary | ICD-10-CM

## 2015-04-08 NOTE — Telephone Encounter (Signed)
Pt asked for me to s/w wife about myoview results. Wife aware of results and the need for echo to assess LV function. I advised [place] will call next week to schedule. wife said ok and thank you.

## 2015-04-08 NOTE — Telephone Encounter (Signed)
I noted Alejandro [name] was seen at symptom management clinic this week.  I called to check on patient.  He sounded hoarse but stated he was doing fine.  He had no complaints at this time.

## 2015-04-11 ENCOUNTER — Ambulatory Visit (HOSPITAL_BASED_OUTPATIENT_CLINIC_OR_DEPARTMENT_OTHER): Payer: BLUE CROSS/BLUE SHIELD

## 2015-04-11 ENCOUNTER — Other Ambulatory Visit (HOSPITAL_BASED_OUTPATIENT_CLINIC_OR_DEPARTMENT_OTHER): Payer: BLUE CROSS/BLUE SHIELD

## 2015-04-11 VITALS — BP 113/73 | HR 93 | Temp 97.5°F

## 2015-04-11 DIAGNOSIS — C3411 Malignant neoplasm of upper lobe, right bronchus or lung: Secondary | ICD-10-CM | POA: Diagnosis not present

## 2015-04-11 DIAGNOSIS — Z95828 Presence of other vascular implants and grafts: Secondary | ICD-10-CM

## 2015-04-11 DIAGNOSIS — C3492 Malignant neoplasm of unspecified part of left bronchus or lung: Secondary | ICD-10-CM

## 2015-04-11 LAB — COMPREHENSIVE METABOLIC PANEL (CC13)
ALT: 38 U/L (ref 0–55)
AST: 25 U/L (ref 5–34)
Albumin: 3.2 g/dL — ABNORMAL LOW (ref 3.5–5.0)
Alkaline Phosphatase: 80 U/L (ref 40–150)
Anion Gap: 12 mEq/L — ABNORMAL HIGH (ref 3–11)
BUN: 28.8 mg/dL — AB (ref 7.0–26.0)
CO2: 24 mEq/L (ref 22–29)
CREATININE: 1.2 mg/dL (ref 0.7–1.3)
Calcium: 8.7 mg/dL (ref 8.4–10.4)
Chloride: 104 mEq/L (ref 98–109)
EGFR: 59 mL/min/{1.73_m2} — ABNORMAL LOW (ref >90)
Glucose: 121 mg/dl (ref 70–140)
Potassium: 3.5 mEq/L (ref 3.5–5.1)
Sodium: 139 mEq/L (ref 136–145)
Total Bilirubin: 0.31 mg/dL (ref 0.20–1.20)
Total Protein: 6.1 g/dL — ABNORMAL LOW (ref 6.4–8.3)

## 2015-04-11 LAB — CBC WITH DIFFERENTIAL/PLATELET
BASO%: 0.2 % (ref 0.0–2.0)
Basophils Absolute: 0 10*3/uL (ref 0.0–0.1)
EOS%: 0.1 % (ref 0.0–7.0)
Eosinophils Absolute: 0 10*3/uL (ref 0.0–0.5)
HCT: 32.7 % — ABNORMAL LOW (ref 38.4–49.9)
HGB: 10.7 g/dL — ABNORMAL LOW (ref 13.0–17.1)
LYMPH%: 7.9 % — AB (ref 14.0–49.0)
MCH: 31.8 pg (ref 27.2–33.4)
MCHC: 32.8 g/dL (ref 32.0–36.0)
MCV: 96.8 fL (ref 79.3–98.0)
MONO#: 0.9 10*3/uL (ref 0.1–0.9)
MONO%: 7.5 % (ref 0.0–14.0)
NEUT%: 84.3 % — ABNORMAL HIGH (ref 39.0–75.0)
NEUTROS ABS: 9.9 10*3/uL — AB (ref 1.5–6.5)
Platelets: 98 10*3/uL — ABNORMAL LOW (ref 140–400)
RBC: 3.38 10*6/uL — ABNORMAL LOW (ref 4.20–5.82)
RDW: 17.4 % — ABNORMAL HIGH (ref 11.0–14.6)
WBC: 11.7 10*3/uL — ABNORMAL HIGH (ref 4.0–10.3)
lymph#: 0.9 10*3/uL (ref 0.9–3.3)

## 2015-04-11 MED ORDER — SODIUM CHLORIDE 0.9 % IJ SOLN
10.0000 mL | INTRAMUSCULAR | Status: DC | PRN
  Administered 2015-04-11: 10 mL via INTRAVENOUS
  Filled 2015-04-11: qty 10

## 2015-04-11 MED ORDER — HEPARIN SOD (PORK) LOCK FLUSH 100 UNIT/ML IV SOLN
500.0000 [IU] | Freq: Once | INTRAVENOUS | Status: AC
  Administered 2015-04-11: 500 [IU] via INTRAVENOUS
  Filled 2015-04-11: qty 5

## 2015-04-11 NOTE — Patient Instructions (Signed)

## 2015-04-14 ENCOUNTER — Ambulatory Visit: Payer: Self-pay | Admitting: Nurse Practitioner

## 2015-04-15 ENCOUNTER — Encounter: Payer: Self-pay | Admitting: Internal Medicine

## 2015-04-15 ENCOUNTER — Ambulatory Visit (INDEPENDENT_AMBULATORY_CARE_PROVIDER_SITE_OTHER): Payer: BLUE CROSS/BLUE SHIELD | Admitting: Internal Medicine

## 2015-04-15 VITALS — BP 122/62 | HR 116 | Temp 98.2°F | Ht 68.0 in | Wt 248.0 lb

## 2015-04-15 DIAGNOSIS — Z9889 Other specified postprocedural states: Secondary | ICD-10-CM

## 2015-04-15 DIAGNOSIS — C3411 Malignant neoplasm of upper lobe, right bronchus or lung: Secondary | ICD-10-CM

## 2015-04-15 DIAGNOSIS — R0609 Other forms of dyspnea: Secondary | ICD-10-CM

## 2015-04-15 MED ORDER — SIMVASTATIN 40 MG PO TABS: 40.0000 mg | ORAL_TABLET | Freq: Every evening | ORAL | Status: DC

## 2015-04-15 MED ORDER — POTASSIUM CHLORIDE CRYS ER 20 MEQ PO TBCR: 20.0000 meq | EXTENDED_RELEASE_TABLET | Freq: Every day | ORAL | Status: DC

## 2015-04-15 MED ORDER — PANTOPRAZOLE SODIUM 40 MG PO TBEC: 40.0000 mg | DELAYED_RELEASE_TABLET | Freq: Every day | ORAL | Status: DC

## 2015-04-15 MED ORDER — FLUOXETINE HCL 20 MG PO CAPS: 20.0000 mg | ORAL_CAPSULE | Freq: Every day | ORAL | Status: DC

## 2015-04-15 MED ORDER — FUROSEMIDE 40 MG PO TABS: 60.0000 mg | ORAL_TABLET | Freq: Every day | ORAL | Status: DC

## 2015-04-15 MED ORDER — LEVETIRACETAM 500 MG PO TABS: 500.0000 mg | ORAL_TABLET | Freq: Two times a day (BID) | ORAL | Status: DC

## 2015-04-15 NOTE — Patient Instructions (Signed)
Next visit in 4-5 months

## 2015-04-15 NOTE — Progress Notes (Signed)
Pre visit review using our clinic review tool, if applicable. No additional management support is needed unless otherwise documented below in the visit note. 

## 2015-04-15 NOTE — Progress Notes (Signed)
Subjective:    Patient ID: Alejandro [name], male    DOB: 26-Nov-1946, 69 y.o.   MRN: [id number]  DOS:  04/15/2015 Type of visit - description : rov Interval history: Since the last visit, he saw cardiology, notes, labs and stress test reviewed. Dyspnea on exertion is about the same. Energy level goes up and down, has good and bad days. Good compliance of medication   Review of Systems Denies chest pain, no edema. No nausea, vomiting, diarrhea  Past Medical History  Diagnosis Date  . Systolic heart failure   . 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 Dyslipidemia   . HTN (hypertension)   . Atrial fibrillation   . Tobacco abuse   . Alcohol abuse   . H/O hiatal hernia   . Myocardial infarction   . Dysrhythmia     atrial fib  . Stroke 5/15  . CHF (congestive heart failure)   . Shortness of breath     occ  . GERD (gastroesophageal reflux disease)   . S/P chemotherapy, time since 4-12 weeks   . Cancer     around nose.  New dx of lung cancer    Past Surgical History  Procedure Laterality Date  . Umbilical hernia repair  2010  . Hernia repair  2012  . Ventral hernia repair N/A 01/22/2013    Procedure: HERNIA REPAIR VENTRAL ADULT;  Surgeon: [name], MD;  Location: [place];  Service: General;  Laterality: N/A;  . Application of a-cell of chest/abdomen N/A 01/22/2013    Procedure: APPLICATION OF A-CELL OF CHEST/ABDOMEN;  Surgeon: [name], MD;  Location: [place];  Service: General;  Laterality: N/A;  . Cystoscopy N/A 01/22/2013    Procedure: Consuela Mimes;  Surgeon: [name], MD;  Location: [place];  Service: Urology;  Laterality: N/A;  Cystoscopy with balloon dilation. Insertion of coude catheter.  . Craniotomy Right 04/19/2014    Procedure: CRANIOTOMY HEMATOMA EVACUATION SUBDURAL;  Surgeon: [name], MD;  Location: [place] [facility];  Service: Neurosurgery;  Laterality: Right;  right  . Craniotomy Right 04/23/2014    Procedure: Craniotomy for  Intracerebral Hemorrhage;  Surgeon: [name], MD;  Location: [place] [facility];  Service: Neurosurgery;  Laterality: Right;  . 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 Coronary artery bypass graft  12/24/2008    4 vessel  . Coronary artery bypass graft  2012?  Marland Kitchen Craniotomy N/A 09/13/2014    Procedure: CRANIOTOMY BONE FLAP/PROSTHETIC PLATE;  Surgeon: [name], MD;  Location: [place] [facility];  Service: Neurosurgery;  Laterality: N/A;  . Video bronchoscopy with endobronchial ultrasound N/A 11/09/2014    Procedure: VIDEO BRONCHOSCOPY WITH ENDOBRONCHIAL ULTRASOUND;  Surgeon: Collene Gobble, MD;  Location: [place];  Service: Thoracic;  Laterality: N/A;    History   Social History  . Marital Status: Married    Spouse Name: N/A  . Number of Children: 2  . Years of Education: N/A   Occupational History  . [employer]   .     Social History Main Topics  . Smoking status: Former Smoker -- 1.00 packs/day for 50 years    Types: Cigarettes    Quit date: 04/19/2013  . Smokeless tobacco: Never Used  . Alcohol Use: No  Comment: quit  . Drug Use: No  . Sexual Activity: Not on file   Other Topics Concern  . Not on file   Social History Narrative        Medication List       This list is accurate as of: 04/15/15 11:59 PM.  Always use your most recent med list.               albuterol 108 (90 BASE) MCG/ACT inhaler  Commonly known as:  PROVENTIL HFA;VENTOLIN HFA  Inhale 2 puffs into the lungs every 4 (four) hours as needed for wheezing or shortness of breath.     aspirin 81 MG tablet  Take 81 mg by mouth daily.     carvedilol 6.25 MG tablet  Commonly known as:  COREG  Take 1.5 tablets (9.375 mg total) by mouth 2 (two) times daily with a meal.     FLUoxetine 20 MG capsule  Commonly known as:  PROZAC  Take 1  capsule (20 mg total) by mouth daily.     furosemide 40 MG tablet  Commonly known as:  LASIX  Take 1.5 tablets (60 mg total) by mouth daily.     hyaluronate sodium Gel  Apply 1 application topically 2 (two) times daily.     levETIRAcetam 500 MG tablet  Commonly known as:  KEPPRA  Take 1 tablet (500 mg total) by mouth 2 (two) times daily.     lidocaine-prilocaine cream  Commonly known as:  EMLA  Apply 1 application topically as needed.     multivitamin with minerals Tabs tablet  Take 1 tablet by mouth daily.     pantoprazole 40 MG tablet  Commonly known as:  PROTONIX  Take 1 tablet (40 mg total) by mouth at bedtime.     potassium chloride SA 20 MEQ tablet  Commonly known as:  K-DUR,KLOR-CON  Take 1 tablet (20 mEq total) by mouth daily.     PRESCRIPTION MEDICATION  Last dose 01/03/15. At the cancer center. Dr Julien Nordmann     simvastatin 40 MG tablet  Commonly known as:  ZOCOR  Take 1 tablet (40 mg total) by mouth every evening.           Objective:   Physical Exam BP 122/62 mmHg  Pulse 116  Temp(Src) 98.2 F (36.8 C) (Oral)  Ht '5\' 8"'$  (1.727 m)  Wt 248 lb (112.492 kg)  BMI 37.72 kg/m2  SpO2 96% General:   Well developed, well nourished . NAD.  HEENT:  Normocephalic . Face symmetric, atraumatic; conjunctivae pale Lungs:  CTA B Normal respiratory effort, no intercostal retractions, no accessory muscle use. Heart: Slightly tachycardic ; trace pretibial edema bilaterally  Skin:   pale. Not jaundice Neurologic:  alert & oriented X3.  Speech normal, gait appropriate for age and unassisted Psych--  Cognition and judgment appear intact.  Cooperative with normal attention span and concentration.  Behavior appropriate. No anxious or depressed appearing.        Assessment & Plan:      Status post craniotomy Still on Keppra, left message for neurosurgery in regards to the length of the treatment.  Multiple refills needed.  Today , I spent more than  25  min  with the patient: >50% of the time counseling regards DOE and reviewing the chart and labs ordered by other providers  Also coordinating his care

## 2015-04-15 NOTE — Telephone Encounter (Signed)
Spoke with Dr. Saintclair Halsted assistant today, they will call next week w/ more info

## 2015-04-16 ENCOUNTER — Encounter: Payer: Self-pay | Admitting: Internal Medicine

## 2015-04-16 DIAGNOSIS — R0609 Other forms of dyspnea: Secondary | ICD-10-CM

## 2015-04-16 NOTE — Assessment & Plan Note (Signed)
Dyspnea on exertion Since the last time he was here, he saw cardiology, BNP was elevated, Lasix dose adjusted. Lexiscan show no inducible ischemia, EF was 24%. All of the above was discussed with the patient and his wife, multiple questions answered to the best of my ability. Still has elevated heart rate and the symptoms are at baseline. Again symptoms are likely multifactorial Plan: Continue with present care, further advised by cardiology, refill meds.

## 2015-04-18 ENCOUNTER — Ambulatory Visit (HOSPITAL_BASED_OUTPATIENT_CLINIC_OR_DEPARTMENT_OTHER): Payer: BLUE CROSS/BLUE SHIELD

## 2015-04-18 ENCOUNTER — Other Ambulatory Visit (HOSPITAL_BASED_OUTPATIENT_CLINIC_OR_DEPARTMENT_OTHER): Payer: BLUE CROSS/BLUE SHIELD

## 2015-04-18 ENCOUNTER — Telehealth: Payer: Self-pay | Admitting: *Deleted

## 2015-04-18 ENCOUNTER — Encounter: Payer: Self-pay | Admitting: Oncology

## 2015-04-18 ENCOUNTER — Telehealth: Payer: Self-pay | Admitting: Oncology

## 2015-04-18 ENCOUNTER — Ambulatory Visit: Payer: BLUE CROSS/BLUE SHIELD

## 2015-04-18 ENCOUNTER — Ambulatory Visit (HOSPITAL_BASED_OUTPATIENT_CLINIC_OR_DEPARTMENT_OTHER): Payer: BLUE CROSS/BLUE SHIELD | Admitting: Oncology

## 2015-04-18 VITALS — BP 110/55 | HR 63 | Temp 98.4°F | Resp 18 | Ht 68.0 in | Wt 252.8 lb

## 2015-04-18 DIAGNOSIS — C3411 Malignant neoplasm of upper lobe, right bronchus or lung: Secondary | ICD-10-CM

## 2015-04-18 DIAGNOSIS — D630 Anemia in neoplastic disease: Secondary | ICD-10-CM | POA: Diagnosis not present

## 2015-04-18 DIAGNOSIS — Z5111 Encounter for antineoplastic chemotherapy: Secondary | ICD-10-CM

## 2015-04-18 DIAGNOSIS — R530 Neoplastic (malignant) related fatigue: Secondary | ICD-10-CM | POA: Diagnosis not present

## 2015-04-18 DIAGNOSIS — C3492 Malignant neoplasm of unspecified part of left bronchus or lung: Secondary | ICD-10-CM

## 2015-04-18 DIAGNOSIS — Z95828 Presence of other vascular implants and grafts: Secondary | ICD-10-CM

## 2015-04-18 LAB — COMPREHENSIVE METABOLIC PANEL (CC13)
ALBUMIN: 2.9 g/dL — AB (ref 3.5–5.0)
ALT: 20 U/L (ref 0–55)
AST: 21 U/L (ref 5–34)
Alkaline Phosphatase: 77 U/L (ref 40–150)
Anion Gap: 10 mEq/L (ref 3–11)
BUN: 19.7 mg/dL (ref 7.0–26.0)
CALCIUM: 8.8 mg/dL (ref 8.4–10.4)
CO2: 24 meq/L (ref 22–29)
Chloride: 107 mEq/L (ref 98–109)
Creatinine: 1.1 mg/dL (ref 0.7–1.3)
EGFR: 65 mL/min/{1.73_m2} — AB (ref >90)
GLUCOSE: 169 mg/dL — AB (ref 70–140)
POTASSIUM: 3.8 meq/L (ref 3.5–5.1)
Sodium: 140 mEq/L (ref 136–145)
TOTAL PROTEIN: 6.2 g/dL — AB (ref 6.4–8.3)
Total Bilirubin: 0.45 mg/dL (ref 0.20–1.20)

## 2015-04-18 LAB — CBC WITH DIFFERENTIAL/PLATELET
BASO%: 0.2 % (ref 0.0–2.0)
Basophils Absolute: 0 10*3/uL (ref 0.0–0.1)
EOS ABS: 0 10*3/uL (ref 0.0–0.5)
EOS%: 0.3 % (ref 0.0–7.0)
HCT: 29.2 % — ABNORMAL LOW (ref 38.4–49.9)
HEMOGLOBIN: 9.8 g/dL — AB (ref 13.0–17.1)
LYMPH%: 9.7 % — ABNORMAL LOW (ref 14.0–49.0)
MCH: 32.2 pg (ref 27.2–33.4)
MCHC: 33.6 g/dL (ref 32.0–36.0)
MCV: 96.1 fL (ref 79.3–98.0)
MONO#: 0.6 10*3/uL (ref 0.1–0.9)
MONO%: 9.5 % (ref 0.0–14.0)
NEUT#: 4.6 10*3/uL (ref 1.5–6.5)
NEUT%: 80.3 % — ABNORMAL HIGH (ref 39.0–75.0)
Platelets: 97 10*3/uL — ABNORMAL LOW (ref 140–400)
RBC: 3.04 10*6/uL — ABNORMAL LOW (ref 4.20–5.82)
RDW: 16.4 % — AB (ref 11.0–14.6)
WBC: 5.8 10*3/uL (ref 4.0–10.3)
lymph#: 0.6 10*3/uL — ABNORMAL LOW (ref 0.9–3.3)

## 2015-04-18 MED ORDER — DIPHENHYDRAMINE HCL 50 MG/ML IJ SOLN
INTRAMUSCULAR | Status: AC
  Filled 2015-04-18: qty 1

## 2015-04-18 MED ORDER — SODIUM CHLORIDE 0.9 % IV SOLN
550.0000 mg | Freq: Once | INTRAVENOUS | Status: AC
  Administered 2015-04-18: 550 mg via INTRAVENOUS
  Filled 2015-04-18: qty 55

## 2015-04-18 MED ORDER — DIPHENHYDRAMINE HCL 50 MG/ML IJ SOLN
50.0000 mg | Freq: Once | INTRAMUSCULAR | Status: AC
  Administered 2015-04-18: 50 mg via INTRAVENOUS

## 2015-04-18 MED ORDER — DEXAMETHASONE SODIUM PHOSPHATE 100 MG/10ML IJ SOLN
Freq: Once | INTRAMUSCULAR | Status: AC
  Administered 2015-04-18: 13:00:00 via INTRAVENOUS
  Filled 2015-04-18: qty 8

## 2015-04-18 MED ORDER — SODIUM CHLORIDE 0.9 % IV SOLN
Freq: Once | INTRAVENOUS | Status: AC
  Administered 2015-04-18: 12:00:00 via INTRAVENOUS

## 2015-04-18 MED ORDER — FAMOTIDINE IN NACL 20-0.9 MG/50ML-% IV SOLN
20.0000 mg | Freq: Once | INTRAVENOUS | Status: AC
  Administered 2015-04-18: 20 mg via INTRAVENOUS

## 2015-04-18 MED ORDER — HEPARIN SOD (PORK) LOCK FLUSH 100 UNIT/ML IV SOLN
500.0000 [IU] | Freq: Once | INTRAVENOUS | Status: AC | PRN
  Administered 2015-04-18: 500 [IU]
  Filled 2015-04-18: qty 5

## 2015-04-18 MED ORDER — FAMOTIDINE IN NACL 20-0.9 MG/50ML-% IV SOLN
INTRAVENOUS | Status: AC
  Filled 2015-04-18: qty 50

## 2015-04-18 MED ORDER — SODIUM CHLORIDE 0.9 % IJ SOLN
10.0000 mL | INTRAMUSCULAR | Status: DC | PRN
  Administered 2015-04-18: 10 mL
  Filled 2015-04-18: qty 10

## 2015-04-18 MED ORDER — PACLITAXEL CHEMO INJECTION 300 MG/50ML
175.0000 mg/m2 | Freq: Once | INTRAVENOUS | Status: AC
  Administered 2015-04-18: 408 mg via INTRAVENOUS
  Filled 2015-04-18: qty 68

## 2015-04-18 MED ORDER — SODIUM CHLORIDE 0.9 % IJ SOLN
10.0000 mL | INTRAMUSCULAR | Status: DC | PRN
  Administered 2015-04-18: 10 mL via INTRAVENOUS
  Filled 2015-04-18: qty 10

## 2015-04-18 NOTE — Telephone Encounter (Signed)
Per staff message and POF I have scheduled appts. Advised scheduler of appts. JMW  

## 2015-04-18 NOTE — Patient Instructions (Signed)
[facility] Discharge Instructions for Patients Receiving Chemotherapy  Today you received the following chemotherapy agents Taxol and Carboplatin. To help prevent nausea and vomiting after your treatment, we encourage you to take your nausea medication as directed.  If you develop nausea and vomiting that is not controlled by your nausea medication, call the clinic.   BELOW ARE SYMPTOMS THAT SHOULD BE REPORTED IMMEDIATELY:  *FEVER GREATER THAN 100.5 F  *CHILLS WITH OR WITHOUT FEVER  NAUSEA AND VOMITING THAT IS NOT CONTROLLED WITH YOUR NAUSEA MEDICATION  *UNUSUAL SHORTNESS OF BREATH  *UNUSUAL BRUISING OR BLEEDING  TENDERNESS IN MOUTH AND THROAT WITH OR WITHOUT PRESENCE OF ULCERS  *URINARY PROBLEMS  *BOWEL PROBLEMS  UNUSUAL RASH Items with * indicate a potential emergency and should be followed up as soon as possible.  Feel free to call the clinic you have any questions or concerns. The clinic phone number is [phone].  Please show the CHEMO ALERT CARD at check-in to the Emergency Department and triage nurse.    

## 2015-04-18 NOTE — Patient Instructions (Signed)

## 2015-04-18 NOTE — Telephone Encounter (Signed)
Pt confirmed labs/ov per 05/16 POF, gave pt AVS and Calendar.... KJ, sent msg to move chemo down

## 2015-04-18 NOTE — Progress Notes (Signed)
[place] [phone]   Fax:(336) [place] [address] [place] [address]  DIAGNOSIS: Non-small cell carcinoma of lung, stage 3  Staging form: Lung, AJCC 7th Edition  Clinical stage from 12/16/2014: Stage IIIA (T1b, N2, M0) - Signed by Curt Bears, MD on 12/18/2014  Staging comments: Squamous cell carcinoma  PRIOR THERAPY: Concurrent chemoradiation with weekly carboplatin for an AUC of 2 and paclitaxel 45 mg/m2.  CURRENT THERAPY: Consolidation chemotherapy with carboplatin AUC of 5 and paclitaxel 175 mg/m given every 3 weeks with Neulasta support started on 03/28/2015.  INTERVAL HISTORY: Alejandro [name] 69 y.o. male returns to the clinic today for follow-up visit accompanied by his wife. The patient tolerated his first cycle of consolidation chemotherapy with carboplatin and Taxol with the exception of the development of a rash. The rash developed approximate 12 days following his Neulasta injection. He was treated with Benadryl, Pepcid, and a steroid taper. His rash has improved significantly. The patient denied having any significant chest pain but continues to have shortness breath with exertion without cough or hemoptysis. He has been seen by cardiology who felt his dyspnea might be related to his congestive heart failure. His carvedilol was increased and he is due for an echocardiogram tomorrow. He has no significant weight loss or night sweats. He has no nausea or vomiting, no fever or chills. He is here to proceed with cycle 2 of consolidation chemotherapy with carboplatin AUC of 5 and paclitaxel 175 mg meter squared every 3 weeks with Neulasta.  MEDICAL HISTORY: Past Medical History  Diagnosis Date  . Systolic heart failure   . 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 Dyslipidemia   . HTN (hypertension)   . Atrial fibrillation   . Tobacco abuse   . Alcohol  abuse   . H/O hiatal hernia   . Myocardial infarction   . Dysrhythmia     atrial fib  . Stroke 5/15  . CHF (congestive heart failure)   . Shortness of breath     occ  . GERD (gastroesophageal reflux disease)   . S/P chemotherapy, time since 4-12 weeks   . Cancer     around nose.  New dx of lung cancer  . Non-small cell carcinoma of lung, stage 3 12/16/2014    ALLERGIES:  has No Known Allergies.  MEDICATIONS:  Current Outpatient Prescriptions  Medication Sig Dispense Refill  . albuterol (PROVENTIL HFA;VENTOLIN HFA) 108 (90 BASE) MCG/ACT inhaler Inhale 2 puffs into the lungs every 4 (four) hours as needed for wheezing or shortness of breath. 1 Inhaler 6  . aspirin 81 MG tablet Take 81 mg by mouth daily.    . carvedilol (COREG) 6.25 MG tablet Take 1.5 tablets (9.375 mg total) by mouth 2 (two) times daily with a meal. 90 tablet 3  . FLUoxetine (PROZAC) 20 MG capsule Take 1 capsule (20 mg total) by mouth daily. 30 capsule 6  . furosemide (LASIX) 40 MG tablet Take 1.5 tablets (60 mg total) by mouth daily. 45 tablet 6  . hyaluronate sodium (RADIAPLEXRX) GEL Apply 1 application topically 2 (two) times daily.    Marland Kitchen levETIRAcetam (KEPPRA) 500 MG tablet Take 1 tablet (500 mg total) by mouth 2 (two) times daily. 60 tablet 6  . lidocaine-prilocaine (EMLA) cream Apply 1 application topically as needed. 30 g 2  . Multiple Vitamin (MULTIVITAMIN WITH  MINERALS) TABS tablet Take 1 tablet by mouth daily.    . pantoprazole (PROTONIX) 40 MG tablet Take 1 tablet (40 mg total) by mouth at bedtime. 30 tablet 6  . potassium chloride SA (K-DUR,KLOR-CON) 20 MEQ tablet Take 1 tablet (20 mEq total) by mouth daily. 30 tablet 6  . PRESCRIPTION MEDICATION Last dose 01/03/15. At the cancer center. Dr Julien Nordmann    . simvastatin (ZOCOR) 40 MG tablet Take 1 tablet (40 mg total) by mouth every evening. 30 tablet 6   No current facility-administered medications for this visit.   Facility-Administered Medications Ordered in  Other Visits  Medication Dose Route Frequency Provider Last Rate Last Dose  . CARBOplatin (PARAPLATIN) 550 mg in sodium chloride 0.9 % 250 mL chemo infusion  550 mg Intravenous Once Curt Bears, MD      . heparin lock flush 100 unit/mL  500 Units Intracatheter Once PRN Curt Bears, MD      . PACLitaxel (TAXOL) 408 mg in dextrose 5 % 500 mL chemo infusion (> '80mg'$ /m2)  175 mg/m2 (Treatment Plan Actual) Intravenous Once Curt Bears, MD 189 mL/hr at 04/18/15 1257 408 mg at 04/18/15 1257  . sodium chloride 0.9 % injection 10 mL  10 mL Intracatheter PRN Curt Bears, MD        SURGICAL HISTORY:  Past Surgical History  Procedure Laterality Date  . Umbilical hernia repair  2010  . Hernia repair  2012  . Ventral hernia repair N/A 01/22/2013    Procedure: HERNIA REPAIR VENTRAL ADULT;  Surgeon: [name], MD;  Location: [facility];  Service: General;  Laterality: N/A;  . Application of a-cell of chest/abdomen N/A 01/22/2013    Procedure: APPLICATION OF A-CELL OF CHEST/ABDOMEN;  Surgeon: [name], MD;  Location: [facility];  Service: General;  Laterality: N/A;  . Cystoscopy N/A 01/22/2013    Procedure: Consuela Mimes;  Surgeon: [name], MD;  Location: [facility];  Service: Urology;  Laterality: N/A;  Cystoscopy with balloon dilation. Insertion of coude catheter.  . Craniotomy Right 04/19/2014    Procedure: CRANIOTOMY HEMATOMA EVACUATION SUBDURAL;  Surgeon: [name], MD;  Location: [facility];  Service: Neurosurgery;  Laterality: Right;  right  . Craniotomy Right 04/23/2014    Procedure: Craniotomy for Intracerebral Hemorrhage;  Surgeon: [name], MD;  Location: [facility];  Service: Neurosurgery;  Laterality: Right;  . 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 Coronary artery bypass graft  12/24/2008    4 vessel  . Coronary artery  bypass graft  2012?  Marland Kitchen Craniotomy N/A 09/13/2014    Procedure: CRANIOTOMY BONE FLAP/PROSTHETIC PLATE;  Surgeon: [name], MD;  Location: [facility];  Service: Neurosurgery;  Laterality: N/A;  . Video bronchoscopy with endobronchial ultrasound N/A 11/09/2014    Procedure: VIDEO BRONCHOSCOPY WITH ENDOBRONCHIAL ULTRASOUND;  Surgeon: Collene Gobble, MD;  Location: [facility];  Service: Thoracic;  Laterality: N/A;    REVIEW OF SYSTEMS:  Constitutional: positive for fatigue Eyes: negative Ears, nose, mouth, throat, and face: negative Respiratory: positive for dyspnea on exertion Cardiovascular: negative Gastrointestinal: negative Genitourinary:negative Integument/breast: negative Hematologic/lymphatic: negative Musculoskeletal:negative Neurological: negative Behavioral/Psych: negative Endocrine: negative Allergic/Immunologic: negative   PHYSICAL EXAMINATION: General appearance: alert, cooperative, fatigued and no distress Head: Normocephalic, without obvious abnormality, atraumatic Neck: no adenopathy, no JVD, supple, symmetrical, trachea midline  and thyroid not enlarged, symmetric, no tenderness/mass/nodules Lymph nodes: Cervical, supraclavicular, and axillary nodes normal. Resp: clear to auscultation bilaterally Back: symmetric, no curvature. ROM normal. No CVA tenderness. Cardio: regular rate and rhythm, S1, S2 normal, no murmur, click, rub or gallop GI: soft, non-tender; bowel sounds normal; no masses,  no organomegaly Extremities: extremities normal, atraumatic, no cyanosis or edema Neurologic: Alert and oriented X 3, normal strength and tone. Normal symmetric reflexes. Normal coordination and gait  ECOG PERFORMANCE STATUS: 1 - Symptomatic but completely ambulatory  Blood pressure 110/55, pulse 63, temperature 98.4 F (36.9 C), temperature source Oral, resp. rate 18, height '5\' 8"'$  (1.727 m), weight 252 lb 12.8 oz (114.669 kg), SpO2 92 %.  LABORATORY DATA: Lab Results  Component  Value Date   WBC 5.8 04/18/2015   HGB 9.8* 04/18/2015   HCT 29.2* 04/18/2015   MCV 96.1 04/18/2015   PLT 97* 04/18/2015      Chemistry      Component Value Date/Time   NA 140 04/18/2015 1101   NA 133* 04/05/2015 1134   K 3.8 04/18/2015 1101   K 3.9 04/05/2015 1134   CL 98 04/05/2015 1134   CO2 24 04/18/2015 1101   CO2 27 04/05/2015 1134   BUN 19.7 04/18/2015 1101   BUN 25* 04/05/2015 1134   CREATININE 1.1 04/18/2015 1101   CREATININE 1.21 04/05/2015 1134   CREATININE 0.99 09/26/2012 1526      Component Value Date/Time   CALCIUM 8.8 04/18/2015 1101   CALCIUM 9.5 04/05/2015 1134   ALKPHOS 77 04/18/2015 1101   ALKPHOS 68 09/23/2014 0922   AST 21 04/18/2015 1101   AST 24 09/23/2014 0922   ALT 20 04/18/2015 1101   ALT 18 09/23/2014 0922   BILITOT 0.45 04/18/2015 1101   BILITOT 0.4 09/23/2014 0922       RADIOGRAPHIC STUDIES: No results found.  ASSESSMENT AND PLAN: This is a very pleasant 69 year old white male with history of stage IIIa non-small cell lung cancer status post course of concurrent chemoradiation with weekly carboplatin and paclitaxel and tolerated his treatment fairly well except for the fatigue from the anemia of neoplastic disease. His recent CT scan of the chest showed improvement in the right upper lobe lung mass as well as some of the mediastinal lymphadenopathy.   He is now receiving consolidation chemotherapy with 3 cycles of systemic chemotherapy with carboplatin for AUC of 5 and paclitaxel 175 MG/M2 every 3 weeks with Neulasta support. He is here for cycle 2 today.  The patient was seen and examined with Dr. Julien Nordmann. He will proceed with cycle 2 of carboplatin and paclitaxel. He will have weekly labs. Recommended that he use Claritin to help with pain related to his Neulasta injection and also with development of rash. He will return in 3 weeks prior to cycle 3 of his chemotherapy.  He was advised to call immediately if he has any concerning symptoms  in the interval.  The patient voices understanding of current disease status and treatment options and is in agreement with the current care plan.  All questions were answered. The patient knows to call the clinic with any problems, questions or concerns. We can certainly see the patient much sooner if necessary.  Mikey Bussing, DNP, AGPCNP-BC, AOCNP    ADDENDUM: Hematology/Oncology Attending: I had a face to face encounter with the patient. I recommended his care plan. This is a very pleasant 69 years old white male with stage IIIa non-small cell lung cancer  status post induction concurrent chemoradiation with weekly carboplatin and paclitaxel. The patient is currently undergoing consolidation chemotherapy with carboplatin and paclitaxel every 3 weeks. He tolerated the first cycle of his treatment fairly well except for some skin rash on the anterior part of the chest and upper extremities. He was treated with Medrol Dosepak and feeling much better. I recommended for the patient to proceed with cycle #2 today as scheduled. He would come back for follow-up visit in 3 weeks for reevaluation before starting cycle #3 He was advised to call immediately if he has any concerning symptoms in the interval.  Disclaimer: This note was dictated with voice recognition software. Similar sounding words can inadvertently be transcribed and may be missed upon review. Eilleen Kempf., MD 04/18/2015

## 2015-04-19 ENCOUNTER — Other Ambulatory Visit: Payer: Self-pay

## 2015-04-19 ENCOUNTER — Ambulatory Visit (HOSPITAL_COMMUNITY): Payer: BLUE CROSS/BLUE SHIELD | Attending: Physician Assistant

## 2015-04-19 DIAGNOSIS — I2510 Atherosclerotic heart disease of native coronary artery without angina pectoris: Secondary | ICD-10-CM | POA: Diagnosis not present

## 2015-04-19 DIAGNOSIS — I5022 Chronic systolic (congestive) heart failure: Secondary | ICD-10-CM | POA: Diagnosis not present

## 2015-04-19 DIAGNOSIS — E785 Hyperlipidemia, unspecified: Secondary | ICD-10-CM | POA: Diagnosis not present

## 2015-04-19 DIAGNOSIS — I252 Old myocardial infarction: Secondary | ICD-10-CM | POA: Insufficient documentation

## 2015-04-19 DIAGNOSIS — I10 Essential (primary) hypertension: Secondary | ICD-10-CM | POA: Diagnosis not present

## 2015-04-20 ENCOUNTER — Ambulatory Visit (HOSPITAL_BASED_OUTPATIENT_CLINIC_OR_DEPARTMENT_OTHER): Payer: BLUE CROSS/BLUE SHIELD

## 2015-04-20 VITALS — BP 109/72 | HR 108 | Temp 98.3°F

## 2015-04-20 DIAGNOSIS — C3411 Malignant neoplasm of upper lobe, right bronchus or lung: Secondary | ICD-10-CM | POA: Diagnosis not present

## 2015-04-20 DIAGNOSIS — Z5189 Encounter for other specified aftercare: Secondary | ICD-10-CM

## 2015-04-20 MED ORDER — PEGFILGRASTIM INJECTION 6 MG/0.6ML ~~LOC~~
6.0000 mg | PREFILLED_SYRINGE | Freq: Once | SUBCUTANEOUS | Status: AC
  Administered 2015-04-20: 6 mg via SUBCUTANEOUS
  Filled 2015-04-20: qty 0.6

## 2015-04-20 NOTE — Telephone Encounter (Signed)
Just wanted to inform you, we still have not heard back from Dr. Windy Carina office.

## 2015-04-20 NOTE — Patient Instructions (Signed)
Pegfilgrastim injection What is this medicine? PEGFILGRASTIM (peg fil GRA stim) is a long-acting granulocyte colony-stimulating factor that stimulates the growth of neutrophils, a type of white blood cell important in the body's fight against infection. It is used to reduce the incidence of fever and infection in patients with certain types of cancer who are receiving chemotherapy that affects the bone marrow. This medicine may be used for other purposes; ask your health care provider or pharmacist if you have questions. COMMON BRAND NAME(S): Neulasta What should I tell my health care provider before I take this medicine? They need to know if you have any of these conditions: -latex allergy -ongoing radiation therapy -sickle cell disease -skin reactions to acrylic adhesives (On-Body Injector only) -an unusual or allergic reaction to pegfilgrastim, filgrastim, other medicines, foods, dyes, or preservatives -pregnant or trying to get pregnant -breast-feeding How should I use this medicine? This medicine is for injection under the skin. If you get this medicine at home, you will be taught how to prepare and give the pre-filled syringe or how to use the On-body Injector. Refer to the patient Instructions for Use for detailed instructions. Use exactly as directed. Take your medicine at regular intervals. Do not take your medicine more often than directed. It is important that you put your used needles and syringes in a special sharps container. Do not put them in a trash can. If you do not have a sharps container, call your pharmacist or healthcare provider to get one. Talk to your pediatrician regarding the use of this medicine in children. Special care may be needed. Overdosage: If you think you have taken too much of this medicine contact a poison control center or emergency room at once. NOTE: This medicine is only for you. Do not share this medicine with others. What if I miss a dose? It is  important not to miss your dose. Call your doctor or health care professional if you miss your dose. If you miss a dose due to an On-body Injector failure or leakage, a new dose should be administered as soon as possible using a single prefilled syringe for manual use. What may interact with this medicine? Interactions have not been studied. Give your health care provider a list of all the medicines, herbs, non-prescription drugs, or dietary supplements you use. Also tell them if you smoke, drink alcohol, or use illegal drugs. Some items may interact with your medicine. This list may not describe all possible interactions. Give your health care provider a list of all the medicines, herbs, non-prescription drugs, or dietary supplements you use. Also tell them if you smoke, drink alcohol, or use illegal drugs. Some items may interact with your medicine. What should I watch for while using this medicine? You may need blood work done while you are taking this medicine. If you are going to need a MRI, CT scan, or other procedure, tell your doctor that you are using this medicine (On-Body Injector only). What side effects may I notice from receiving this medicine? Side effects that you should report to your doctor or health care professional as soon as possible: -allergic reactions like skin rash, itching or hives, swelling of the face, lips, or tongue -dizziness -fever -pain, redness, or irritation at site where injected -pinpoint red spots on the skin -shortness of breath or breathing problems -stomach or side pain, or pain at the shoulder -swelling -tiredness -trouble passing urine Side effects that usually do not require medical attention (report to your doctor   or health care professional if they continue or are bothersome): -bone pain -muscle pain This list may not describe all possible side effects. Call your doctor for medical advice about side effects. You may report side effects to FDA at  1-800-FDA-1088. Where should I keep my medicine? Keep out of the reach of children. Store pre-filled syringes in a refrigerator between 2 and 8 degrees C (36 and 46 degrees F). Do not freeze. Keep in carton to protect from light. Throw away this medicine if it is left out of the refrigerator for more than 48 hours. Throw away any unused medicine after the expiration date. NOTE: This sheet is a summary. It may not cover all possible information. If you have questions about this medicine, talk to your doctor, pharmacist, or health care provider.  2015, [employer] Standard. (2014-02-18 16:14:05)  

## 2015-04-21 ENCOUNTER — Ambulatory Visit (INDEPENDENT_AMBULATORY_CARE_PROVIDER_SITE_OTHER): Payer: BLUE CROSS/BLUE SHIELD | Admitting: Emergency Medicine

## 2015-04-21 ENCOUNTER — Encounter: Payer: Self-pay | Admitting: Emergency Medicine

## 2015-04-21 VITALS — BP 104/64 | HR 110 | Ht 68.0 in | Wt 249.0 lb

## 2015-04-21 DIAGNOSIS — J449 Chronic obstructive pulmonary disease, unspecified: Secondary | ICD-10-CM

## 2015-04-21 DIAGNOSIS — C3411 Malignant neoplasm of upper lobe, right bronchus or lung: Secondary | ICD-10-CM

## 2015-04-21 DIAGNOSIS — R490 Dysphonia: Secondary | ICD-10-CM

## 2015-04-21 NOTE — Assessment & Plan Note (Signed)
Unclear to me and him as to whether the Spiriva has been beneficial. We will stop the medication for now. I asked him to use the albuterol to see if it benefits him in any way.

## 2015-04-21 NOTE — Assessment & Plan Note (Signed)
Etiology unclear but seems to have been exacerbated when he underwent general anesthesia for his EBUS. I will empirically treat his allergic rhinitis with Nasonex. If he doesn't benefit it may be reasonable to obtain an ENT consult to perform laryngoscopy.

## 2015-04-21 NOTE — Assessment & Plan Note (Signed)
Undergoing chemotherapy and following with Dr. Julien Nordmann

## 2015-04-21 NOTE — Progress Notes (Signed)
  HPI: 69 yo man follows for RUL mass and mediastinal LAD. We performed EBUS, got good nodal tissue but no dx. He follows now to discuss next options. He had a repeat CT scan today. He is feeling well s/p our bronchoscopy.   ROV 03/22/15 -- 69 yo former smoker (40-50 pk-yrs) patient follows for squamous cell lung cancer. He's been treated by Dr. Julien Nordmann with concurrent chemoradiation, last dose was about 5 weeks ago. CT scan of the chest performed 03/11/15 shows a decrease in size of his right upper lobe mass and similar mediastinal lymphadenopathy.  He is to restart chemo at the end of this month.  He has a lot of fatigue, has dyspnea that his wife points out is severe. He is not on any BD.   ROV 04/21/15 -- follow-up visit for squamous cell lung cancer and history of tobacco use with suspected COPD. Last visit we started Spiriva to see if he will benefit - hard to say if has been helpful. He has had a raspy voice ever since his EBUS, actually even before when he had an NGT back a year ago. He has some nasal congestion. He is on loratadine daily.      Filed Vitals:   04/21/15 1602 04/21/15 1604  BP:  104/64  Pulse:  110  Height: '5\' 8"'$  (1.727 m)   Weight: 249 lb (112.946 kg)   SpO2:  96%   Gen: Pleasant, well-nourished, in no distress,  normal affect  ENT: No lesions,  mouth clear,  oropharynx clear, no postnasal drip  Neck: No JVD, no TMG, no carotid bruits  Lungs: No use of accessory muscles, distant, clear without rales or rhonchi  Cardiovascular: RRR, heart sounds normal, no murmur or gallops, no peripheral edema  Musculoskeletal: No deformities, no cyanosis or clubbing  Neuro: alert, non focal  Skin: Warm, no lesions or rashes    CT scan 03/11/15 --  IMPRESSION: 1. Reduced size of the right upper lobe mass. The measurement which is thought to be most suitable and replicable given the orientation of the lesion is the vertical thickness ; currently this is 7 mm compared to 1.3 cm  previously. This indicates improvement in the primary lesion. 2. Persistent thoracic scratch that persistent mediastinal adenopathy, roughly similar to the prior exam. 3. New small bilateral pleural effusions with passive atelectasis. 4. Atherosclerosis.   Non-small cell carcinoma of lung, stage 3 Undergoing chemotherapy and following with Dr. Julien Nordmann   COPD mixed type Unclear to me and him as to whether the Spiriva has been beneficial. We will stop the medication for now. I asked him to use the albuterol to see if it benefits him in any way.    Hoarseness of voice Etiology unclear but seems to have been exacerbated when he underwent general anesthesia for his EBUS. I will empirically treat his allergic rhinitis with Nasonex. If he doesn't benefit it may be reasonable to obtain an ENT consult to perform laryngoscopy.

## 2015-04-21 NOTE — Patient Instructions (Signed)
Please stop Spiriva for now Try using your albuterol 2 puffs either before exertion or whenever you have shortness of breath. Keep track of whether the medication helps you so we can decide in the future whether to continue it Try using Nasonex 2 sprays each nostril once a day to see if it helps your nose congestion and voice hoarseness Follow with Dr Julien Nordmann as planned Follow with Dr [name] in 4 months or sooner if you have any problems.

## 2015-04-22 ENCOUNTER — Ambulatory Visit (INDEPENDENT_AMBULATORY_CARE_PROVIDER_SITE_OTHER): Payer: BLUE CROSS/BLUE SHIELD | Admitting: Physician Assistant

## 2015-04-22 ENCOUNTER — Encounter: Payer: Self-pay | Admitting: Physician Assistant

## 2015-04-22 VITALS — BP 110/60 | HR 112 | Ht 68.0 in | Wt 250.0 lb

## 2015-04-22 DIAGNOSIS — I2510 Atherosclerotic heart disease of native coronary artery without angina pectoris: Secondary | ICD-10-CM

## 2015-04-22 DIAGNOSIS — S065X9A Traumatic subdural hemorrhage with loss of consciousness of unspecified duration, initial encounter: Secondary | ICD-10-CM

## 2015-04-22 DIAGNOSIS — I5022 Chronic systolic (congestive) heart failure: Secondary | ICD-10-CM

## 2015-04-22 DIAGNOSIS — I482 Chronic atrial fibrillation, unspecified: Secondary | ICD-10-CM

## 2015-04-22 DIAGNOSIS — I429 Cardiomyopathy, unspecified: Secondary | ICD-10-CM | POA: Diagnosis not present

## 2015-04-22 DIAGNOSIS — R0602 Shortness of breath: Secondary | ICD-10-CM | POA: Diagnosis not present

## 2015-04-22 DIAGNOSIS — C3411 Malignant neoplasm of upper lobe, right bronchus or lung: Secondary | ICD-10-CM

## 2015-04-22 DIAGNOSIS — I10 Essential (primary) hypertension: Secondary | ICD-10-CM

## 2015-04-22 DIAGNOSIS — S065XAA Traumatic subdural hemorrhage with loss of consciousness status unknown, initial encounter: Secondary | ICD-10-CM

## 2015-04-22 DIAGNOSIS — E785 Hyperlipidemia, unspecified: Secondary | ICD-10-CM

## 2015-04-22 DIAGNOSIS — I6200 Nontraumatic subdural hemorrhage, unspecified: Secondary | ICD-10-CM

## 2015-04-22 MED ORDER — METOPROLOL TARTRATE 50 MG PO TABS: 50.0000 mg | ORAL_TABLET | Freq: Two times a day (BID) | ORAL | Status: DC

## 2015-04-22 NOTE — Progress Notes (Signed)
Cardiology Office Note   Date:  04/22/2015   ID:  Juanito, Alejandro [name], MRN [id number]  PCP:  [name], MD  Cardiologist:  Dr. [name]     Chief Complaint  Patient presents with  . Congestive Heart Failure  . Coronary Artery Disease  . Atrial Fibrillation     History of Present Illness: Alejandro [name] is a 69 y.o. male with a hx of CAD, status post CABG in 12/2008 (LIMA-LAD, SVG-diagonal, SVG-OM, SVG-PDA) ischemic/nonischemic cardiomyopathy, systolic CHF, HTN, HL, permanent atrial fibrillation. EF has been severely depressed in the past. MRI in 09/2013 demonstrated improved LV function with an EF of 52%  Admitted 04/2014 after falling and hitting his head resulting in a subdural hematoma which required craniotomy and hematoma evacuation. He ultimately underwent reexploration of right craniotomy for partial temporal lobectomy and evacuation of right temporal hematoma with craniectomy. He was taken off anticoagulation and is not felt to be a candidate for future anticoagulation.   He has been diagnosed with stage IIIa non-small cell lung CA status post course of concurrent chemoradiation with carboplatin and paclitaxel.  PCP recently contacted Dr. [name] secondary to hypotension related to cancer treatment. Carvedilol dose was reduced and lisinopril was placed on hold.  I recently saw him on 03/29/15 with complaints of increased dyspnea with exertion. He did have signs of volume excess. Recent surveillance CT demonstrated bilateral pleural effusions. His pulmonologist also adjusted his pulmonary regimen. His heart rate was uncontrolled. I increased his beta blocker. His volume appeared stable. However, his BNP remained elevated. I adjusted his Lasix further. I set him up for a nuclear stress test. This was felt to be high risk with large severe fixed defect involving the entire inferior wall and apex consistent with infarct but no ischemia, EF 24%. Therefore, echocardiogram was  obtained and demonstrated an EF of 30-35% with mild MR and biatrial enlargement. He returns for follow-up.  Since last seen, he has had no improvement in his dyspnea. He remains NYHA 3. He denies orthopnea or PND. LE edema is stable. He denies any weight changes at home. He denies chest discomfort or syncope. He denies palpitations but has had one episode of dizziness. He saw Dr. [name] recently who felt that he did not have much of an improvement with Spiriva. Spiriva was discontinued.   Studies/Reports Reviewed Today:  Nuclear Study 04/05/15 High risk study with large sized, severe intensity, fixed defect involving the entire inferior wall and apex consistent with prior infarct. No ischemic noted. Severe LV dysfunction with EF 24%.  Echo 04/19/15 - EF 30% to 35%.  - Aortic valve: There was trivial regurgitation. - Mitral valve: There was mild regurgitation. - Left atrium: The atrium was severely dilated. - Right atrium: The atrium was moderately dilated.  LHC (12/2008): Left main 95% LAD 90% circumflex marginal 70% EF 25-30%. => CABG  Echo (02/24/13):  Mild LVH, EF 30-35%, mild LAE, mild RVE, mildly reduced RVSF  Cardiac MRI (09/08/13):  EF 52%, no hyper-enhancement or infarct in LV myocardium, mild to moderate LAE, mild MR  Past Medical History  Diagnosis Date  . Systolic heart failure   . 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 Dyslipidemia   . HTN (hypertension)   . Atrial fibrillation   . Tobacco abuse   . Alcohol abuse   . H/O hiatal hernia   . Myocardial infarction   . Dysrhythmia     atrial fib  .  Stroke 5/15  . CHF (congestive heart failure)   . Shortness of breath     occ  . GERD (gastroesophageal reflux disease)   . S/P chemotherapy, time since 4-12 weeks   . Cancer     around nose.  New dx of lung cancer  . Non-small cell carcinoma of lung, stage 3 12/16/2014    Past Surgical History  Procedure Laterality Date  . Umbilical hernia  repair  2010  . Hernia repair  2012  . Ventral hernia repair N/A 01/22/2013    Procedure: HERNIA REPAIR VENTRAL ADULT;  Surgeon: [name], MD;  Location: [facility];  Service: General;  Laterality: N/A;  . Application of a-cell of chest/abdomen N/A 01/22/2013    Procedure: APPLICATION OF A-CELL OF CHEST/ABDOMEN;  Surgeon: [name], MD;  Location: [facility];  Service: General;  Laterality: N/A;  . Cystoscopy N/A 01/22/2013    Procedure: Consuela Mimes;  Surgeon: [name], MD;  Location: [facility];  Service: Urology;  Laterality: N/A;  Cystoscopy with balloon dilation. Insertion of coude catheter.  . Craniotomy Right 04/19/2014    Procedure: CRANIOTOMY HEMATOMA EVACUATION SUBDURAL;  Surgeon: [name], MD;  Location: [facility];  Service: Neurosurgery;  Laterality: Right;  right  . Craniotomy Right 04/23/2014    Procedure: Craniotomy for Intracerebral Hemorrhage;  Surgeon: [name], MD;  Location: [facility];  Service: Neurosurgery;  Laterality: Right;  . 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 Coronary artery bypass graft  12/24/2008    4 vessel  . Coronary artery bypass graft  2012?  Marland Kitchen Craniotomy N/A 09/13/2014    Procedure: CRANIOTOMY BONE FLAP/PROSTHETIC PLATE;  Surgeon: [name], MD;  Location: [facility];  Service: Neurosurgery;  Laterality: N/A;  . Video bronchoscopy with endobronchial ultrasound N/A 11/09/2014    Procedure: VIDEO BRONCHOSCOPY WITH ENDOBRONCHIAL ULTRASOUND;  Surgeon: Collene Gobble, MD;  Location: [facility];  Service: Thoracic;  Laterality: N/A;     Current Outpatient Prescriptions  Medication Sig Dispense Refill  . albuterol (PROVENTIL HFA;VENTOLIN HFA) 108 (90 BASE) MCG/ACT inhaler Inhale 2 puffs into the lungs every 4 (four) hours as needed for wheezing or shortness of breath. 1 Inhaler 6  . aspirin 81 MG  tablet Take 81 mg by mouth daily.    Marland Kitchen FLUoxetine (PROZAC) 20 MG capsule Take 1 capsule (20 mg total) by mouth daily. 30 capsule 6  . furosemide (LASIX) 40 MG tablet Take 1.5 tablets (60 mg total) by mouth daily. 45 tablet 6  . hyaluronate sodium (RADIAPLEXRX) GEL Apply 1 application topically 2 (two) times daily.    Marland Kitchen levETIRAcetam (KEPPRA) 500 MG tablet Take 1 tablet (500 mg total) by mouth 2 (two) times daily. 60 tablet 6  . lidocaine-prilocaine (EMLA) cream Apply 1 application topically as needed. 30 g 2  . Multiple Vitamin (MULTIVITAMIN WITH MINERALS) TABS tablet Take 1 tablet by mouth daily.    . pantoprazole (PROTONIX) 40 MG tablet Take 1 tablet (40 mg total) by mouth at bedtime. 30 tablet 6  . potassium chloride SA (K-DUR,KLOR-CON) 20 MEQ tablet Take 1 tablet (20 mEq total) by mouth daily. 30 tablet 6  . PRESCRIPTION MEDICATION Last dose 01/03/15. At the cancer center. Dr Julien Nordmann    . simvastatin (ZOCOR) 40 MG tablet Take 1 tablet (40 mg  total) by mouth every evening. 30 tablet 6  . Tiotropium Bromide Monohydrate 2.5 MCG/ACT AERS Inhale 2 puffs into the lungs daily.    . metoprolol (LOPRESSOR) 50 MG tablet Take 1 tablet (50 mg total) by mouth 2 (two) times daily. 60 tablet 11   No current facility-administered medications for this visit.    Allergies:   Review of patient's allergies indicates no known allergies.    Social History:  The patient  reports that he quit smoking about 2 years ago. His smoking use included Cigarettes. He has a 50 pack-year smoking history. He has never used smokeless tobacco. He reports that he does not drink alcohol or use illicit drugs.   Family History:  The patient's family history includes Diabetes in his father and mother; Heart attack in his brother, father, and mother; Heart disease in his brother, father, and mother; Rheum arthritis in his daughter. There is no history of Colon cancer, Prostate cancer, or Stroke.    ROS:   Please see the history of  present illness.   Review of Systems  All other systems reviewed and are negative.    PHYSICAL EXAM: VS:  BP 110/60 mmHg  Pulse 112  Ht '5\' 8"'$  (1.727 m)  Wt 250 lb (113.399 kg)  BMI 38.02 kg/m2    Wt Readings from Last 3 Encounters:  04/22/15 250 lb (113.399 kg)  04/21/15 249 lb (112.946 kg)  04/18/15 252 lb 12.8 oz (114.669 kg)     GEN: Well nourished, well developed, in no acute distress HEENT: normal Neck: I cannot appreciate JVD,  no masses Cardiac:  Normal S1/S2, irregularly irregular rhythm; no murmur ,  no rubs or gallops, trace bilateral LE  edema  Respiratory:  Decreased breath sounds bilaterally, no wheezing, rhonchi or rales. GI: soft, nontender, nondistended  MS: no deformity or atrophy Skin: warm and dry  Neuro:  CNs II-XII intact, Strength and sensation are intact Psych: Normal affect   EKG:  EKG is ordered today.  It demonstrates:   AFib HR 112, lat TWI, no change from prior tracing.    Recent Labs: 04/30/2014: Magnesium 2.5 04/05/2015: Pro B Natriuretic peptide (BNP) 252.0* 04/18/2015: ALT 20; BUN 19.7; Creatinine 1.1; Hemoglobin 9.8*; Platelets 97*; Potassium 3.8; Sodium 140    Lipid Panel    Component Value Date/Time   CHOL 120 09/23/2014 0922   TRIG 174.0* 09/23/2014 0922   HDL 29.60* 09/23/2014 0922   CHOLHDL 4 09/23/2014 0922   VLDL 34.8 09/23/2014 0922   LDLCALC 56 09/23/2014 0922      ASSESSMENT AND PLAN:  SOB (shortness of breath):  I continue to feel that his dyspnea is multifactorial. Question if rapid ventricular rate has contributed to his worsening LV function and increased dyspnea. I reviewed his stress test and echocardiogram today with Dr. [name]. At this point, we do not feel that the patient needs to undergo cardiac catheterization. He has limited therapeutic options given his need to avoid anticoagulation.  SYSTOLIC HEART FAILURE, CHRONIC:  Overall, volume appears to be stable.  Cardiomyopathy - Mixed Ischemic/Non-Ischemic (EF 52%  by MRI 09/2013):  EF improved to 52% by MRI in 2014. ACE inhibitor was recently held secondary to hypotension. Recent echocardiogram with EF 30-35%. As outlined above, question if uncontrolled heart rate has contributed to reduced LV function. We will try to adjust his rate controlling therapy better and eventually add back an ACE inhibitor if his blood pressure will allow. Consider repeating his echocardiogram once his heart rate is  better controlled.  Coronary artery disease:  Continue aspirin, beta blocker, statin. He denies chest pain. As noted, I reviewed with Dr. [name]. We do not feel that the patient needs to undergo cardiac catheterization at this time.  Chronic atrial fibrillation:  Heart rate is uncontrolled. He has had difficulty with increasing doses of Coreg due to hypotension. I considered placing him on digoxin. However, for now I will stop his carvedilol. I will place him on metoprolol tartrate 50 mg twice a day. He will be brought back in one week to see the nurse for blood pressure check and ECG. If his heart rate remains uncontrolled and his blood pressure is too soft to further titrate metoprolol, I will place him on digoxin 0.125 mg daily at that time.  Essential hypertension:  Controlled  Hyperlipidemia:  Continue statin.  Subdural hematoma:  He is not a candidate for oral anticoagulation.  Non-small cell carcinoma of lung, stage 3:  Continue follow-up with oncology.  Current medicines are reviewed at length with the patient today.  Concerns regarding medicines are as outlined above.  The following changes have been made:    Stop carvedilol  Start metoprolol tartrate 50 mg twice a day  Consider digoxin in the future   Labs/ tests ordered today include:  Orders Placed This Encounter  Procedures  . EKG 12-Lead  . PR OFFICE OUTPATIENT VISIT 5 MINUTES    Disposition:   FU me or Dr. [name] in 4-6 weeks.   Signed, Versie Starks, MHS 04/22/2015 1:26 PM    [facility] [address], [place], [place]  [address] Phone: [phone]; Fax: [phone]

## 2015-04-22 NOTE — Telephone Encounter (Signed)
Okay, we'll continue with Keppra for now

## 2015-04-22 NOTE — Patient Instructions (Signed)
Medication Instructions:  1. STOP COREG 2. START METOPROLOL TARTRATE 50 MG 1 TAB TWICE DAILY; RX SENT IN TODAY  Labwork: NONE  Testing/Procedures: NONE  Follow-Up: 1. NURSE VISIT FOR BP CHECK AND EKG TO BE DONE IN 2 WEEKS; COREG D/C 5/20; NEW START METOPROLOL TART 50 MG BID  2. 4-6 WEEK WITH [facility], PAC SAME DAY DR. [name] IS IN THE OFFICE  Any Other Special Instructions Will Be Listed Below (If Applicable).

## 2015-04-25 ENCOUNTER — Other Ambulatory Visit (HOSPITAL_BASED_OUTPATIENT_CLINIC_OR_DEPARTMENT_OTHER): Payer: BLUE CROSS/BLUE SHIELD

## 2015-04-25 ENCOUNTER — Ambulatory Visit: Payer: BLUE CROSS/BLUE SHIELD

## 2015-04-25 ENCOUNTER — Ambulatory Visit (HOSPITAL_BASED_OUTPATIENT_CLINIC_OR_DEPARTMENT_OTHER): Payer: BLUE CROSS/BLUE SHIELD | Admitting: Nurse Practitioner

## 2015-04-25 VITALS — BP 103/83 | HR 96 | Temp 97.8°F | Resp 20

## 2015-04-25 VITALS — BP 91/52 | HR 80

## 2015-04-25 DIAGNOSIS — D61810 Antineoplastic chemotherapy induced pancytopenia: Secondary | ICD-10-CM

## 2015-04-25 DIAGNOSIS — E860 Dehydration: Secondary | ICD-10-CM

## 2015-04-25 DIAGNOSIS — I959 Hypotension, unspecified: Secondary | ICD-10-CM | POA: Diagnosis not present

## 2015-04-25 DIAGNOSIS — Z95828 Presence of other vascular implants and grafts: Secondary | ICD-10-CM

## 2015-04-25 DIAGNOSIS — C3411 Malignant neoplasm of upper lobe, right bronchus or lung: Secondary | ICD-10-CM

## 2015-04-25 DIAGNOSIS — N289 Disorder of kidney and ureter, unspecified: Secondary | ICD-10-CM

## 2015-04-25 DIAGNOSIS — E8809 Other disorders of plasma-protein metabolism, not elsewhere classified: Secondary | ICD-10-CM

## 2015-04-25 DIAGNOSIS — R531 Weakness: Secondary | ICD-10-CM | POA: Diagnosis not present

## 2015-04-25 DIAGNOSIS — T451X5A Adverse effect of antineoplastic and immunosuppressive drugs, initial encounter: Principal | ICD-10-CM

## 2015-04-25 DIAGNOSIS — C3492 Malignant neoplasm of unspecified part of left bronchus or lung: Secondary | ICD-10-CM

## 2015-04-25 LAB — CBC WITH DIFFERENTIAL/PLATELET
BASO%: 1.3 % (ref 0.0–2.0)
Basophils Absolute: 0 10*3/uL (ref 0.0–0.1)
EOS%: 2.1 % (ref 0.0–7.0)
Eosinophils Absolute: 0 10*3/uL (ref 0.0–0.5)
HEMATOCRIT: 27.8 % — AB (ref 38.4–49.9)
HEMOGLOBIN: 9.4 g/dL — AB (ref 13.0–17.1)
LYMPH%: 39.3 % (ref 14.0–49.0)
MCH: 32.2 pg (ref 27.2–33.4)
MCHC: 33.9 g/dL (ref 32.0–36.0)
MCV: 95.1 fL (ref 79.3–98.0)
MONO#: 0.2 10*3/uL (ref 0.1–0.9)
MONO%: 17.5 % — ABNORMAL HIGH (ref 0.0–14.0)
NEUT#: 0.4 10*3/uL — CL (ref 1.5–6.5)
NEUT%: 39.8 % (ref 39.0–75.0)
Platelets: 55 10*3/uL — ABNORMAL LOW (ref 140–400)
RBC: 2.92 10*6/uL — ABNORMAL LOW (ref 4.20–5.82)
RDW: 15.7 % — ABNORMAL HIGH (ref 11.0–14.6)
WBC: 0.9 10*3/uL — AB (ref 4.0–10.3)
lymph#: 0.4 10*3/uL — ABNORMAL LOW (ref 0.9–3.3)

## 2015-04-25 LAB — COMPREHENSIVE METABOLIC PANEL (CC13)
ALK PHOS: 79 U/L (ref 40–150)
ALT: 18 U/L (ref 0–55)
ANION GAP: 13 meq/L — AB (ref 3–11)
AST: 19 U/L (ref 5–34)
Albumin: 3.1 g/dL — ABNORMAL LOW (ref 3.5–5.0)
BILIRUBIN TOTAL: 0.72 mg/dL (ref 0.20–1.20)
BUN: 40.5 mg/dL — AB (ref 7.0–26.0)
CALCIUM: 8.4 mg/dL (ref 8.4–10.4)
CHLORIDE: 107 meq/L (ref 98–109)
CO2: 20 mEq/L — ABNORMAL LOW (ref 22–29)
Creatinine: 1.5 mg/dL — ABNORMAL HIGH (ref 0.7–1.3)
EGFR: 49 mL/min/{1.73_m2} — AB (ref >90)
Glucose: 158 mg/dl — ABNORMAL HIGH (ref 70–140)
POTASSIUM: 3.8 meq/L (ref 3.5–5.1)
SODIUM: 139 meq/L (ref 136–145)
Total Protein: 6.3 g/dL — ABNORMAL LOW (ref 6.4–8.3)

## 2015-04-25 MED ORDER — SODIUM CHLORIDE 0.9 % IV SOLN
INTRAVENOUS | Status: AC
  Administered 2015-04-25: 15:00:00 via INTRAVENOUS

## 2015-04-25 MED ORDER — SODIUM CHLORIDE 0.9 % IJ SOLN
10.0000 mL | INTRAMUSCULAR | Status: DC | PRN
Start: 2015-04-25 — End: 2015-04-27
  Filled 2015-04-25: qty 10

## 2015-04-25 MED ORDER — CIPROFLOXACIN HCL 500 MG PO TABS: 500.0000 mg | ORAL_TABLET | Freq: Two times a day (BID) | ORAL | Status: DC

## 2015-04-25 MED ORDER — HEPARIN SOD (PORK) LOCK FLUSH 100 UNIT/ML IV SOLN
500.0000 [IU] | Freq: Once | INTRAVENOUS | Status: AC
  Administered 2015-04-25: 500 [IU] via INTRAVENOUS
  Filled 2015-04-25: qty 5

## 2015-04-25 MED ORDER — SODIUM CHLORIDE 0.9 % IJ SOLN
10.0000 mL | INTRAMUSCULAR | Status: DC | PRN
  Administered 2015-04-25 (×2): 10 mL via INTRAVENOUS
  Filled 2015-04-25: qty 10

## 2015-04-25 NOTE — Patient Instructions (Signed)

## 2015-04-26 ENCOUNTER — Telehealth: Payer: Self-pay | Admitting: *Deleted

## 2015-04-26 MED ORDER — UNABLE TO FIND: Status: DC

## 2015-04-26 NOTE — Telephone Encounter (Signed)
Call from [facility] pt's POA who has requested a wheelchair for pt as he is unable to perform ADL's due to weakness in legs and trouble with balance. Adonis Brook is requesting rx for wheelchair to be faxed to [phone]. Pt seen in [facility] 56/23. Will review with NP regarding pt need for wheelchair.

## 2015-04-27 ENCOUNTER — Encounter: Payer: Self-pay | Admitting: Nurse Practitioner

## 2015-04-27 ENCOUNTER — Telehealth: Payer: Self-pay | Admitting: *Deleted

## 2015-04-27 ENCOUNTER — Telehealth: Payer: Self-pay | Admitting: Cardiovascular Disease

## 2015-04-27 DIAGNOSIS — D61810 Antineoplastic chemotherapy induced pancytopenia: Secondary | ICD-10-CM | POA: Insufficient documentation

## 2015-04-27 DIAGNOSIS — E860 Dehydration: Secondary | ICD-10-CM | POA: Insufficient documentation

## 2015-04-27 DIAGNOSIS — T451X5A Adverse effect of antineoplastic and immunosuppressive drugs, initial encounter: Principal | ICD-10-CM | POA: Insufficient documentation

## 2015-04-27 DIAGNOSIS — E8809 Other disorders of plasma-protein metabolism, not elsewhere classified: Secondary | ICD-10-CM | POA: Insufficient documentation

## 2015-04-27 DIAGNOSIS — R531 Weakness: Secondary | ICD-10-CM | POA: Insufficient documentation

## 2015-04-27 DIAGNOSIS — I959 Hypotension, unspecified: Secondary | ICD-10-CM | POA: Insufficient documentation

## 2015-04-27 DIAGNOSIS — N289 Disorder of kidney and ureter, unspecified: Secondary | ICD-10-CM | POA: Insufficient documentation

## 2015-04-27 NOTE — Telephone Encounter (Signed)
New message      Pt saw cancer doctor last Monday.  They said stop carvedilol because his bp was 80/40.  Pt has not had any carvedilol since Monday. His bp yesterday was 90/74.  Is it ok to stay off rx?

## 2015-04-27 NOTE — Assessment & Plan Note (Signed)
Creatinine increased to 1.5 today.  Most likely, this is secondary to mild dehydration.  Will continue to monitor closely.

## 2015-04-27 NOTE — Assessment & Plan Note (Signed)
Patient is complaining of increased fatigue and weakness since his last chemotherapy.  He also is complaining of decreased appetite and poor oral intake.  He feels slightly dehydrated today.  Also, patient's blood pressure on initial check was low at 91/52.  Patient was given 1 L normal saline IV fluid rehydration while cancer Center today.  He was also encouraged to push fluids at home is much as possible.

## 2015-04-27 NOTE — Assessment & Plan Note (Signed)
Patient received his last cycle of paclitaxel/carboplatin chemotherapy on 04/18/2015.  He is scheduled for labs on 05/03/2015.  He is scheduled for labs, follow up visit, and his next chemotherapy on 05/09/2015.

## 2015-04-27 NOTE — Progress Notes (Signed)
SYMPTOM MANAGEMENT CLINIC   HPI: Alejandro [name] 69 y.o. male diagnosed with lung cancer.  Currently undergoing paclitaxel/carboplatin chemotherapy regimen.   Blood pressure on initial check at the cancer Center was 91/52; and on recheck blood pressure down to 86/47.  Patient has been complaining of increased fatigue/weakness since his last chemotherapy.  Patient's hgb has decreased to 9.4 as well.  Patient denies any recent fevers or chills.  Patient admits to feeling mildly dehydrated today as well.  Patient states that he has a history of hypertension; has recently seen both his primary care provider and his cardiologist-he manages his blood pressure medications.  He states that one of his blood pressure medication has been eliminated from his regimen due to issues with hypotension.  Patient continues to take Lasix 60 mg every day, and Lopressor 50 mg twice daily.  HPI  ROS  Past Medical History  Diagnosis Date  . Systolic heart failure   . 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 Dyslipidemia   . HTN (hypertension)   . Atrial fibrillation   . Tobacco abuse   . Alcohol abuse   . H/O hiatal hernia   . Myocardial infarction   . Dysrhythmia     atrial fib  . Stroke 5/15  . CHF (congestive heart failure)   . Shortness of breath     occ  . GERD (gastroesophageal reflux disease)   . S/P chemotherapy, time since 4-12 weeks   . Cancer     around nose.  New dx of lung cancer  . Non-small cell carcinoma of lung, stage 3 12/16/2014    Past Surgical History  Procedure Laterality Date  . Umbilical hernia repair  2010  . Hernia repair  2012  . Ventral hernia repair N/A 01/22/2013    Procedure: HERNIA REPAIR VENTRAL ADULT;  Surgeon: [name], MD;  Location: [place];  Service: General;  Laterality: N/A;  . Application of a-cell of chest/abdomen N/A 01/22/2013    Procedure: APPLICATION OF A-CELL OF CHEST/ABDOMEN;  Surgeon: [name], MD;  Location: [place];   Service: General;  Laterality: N/A;  . Cystoscopy N/A 01/22/2013    Procedure: Consuela Mimes;  Surgeon: [name], MD;  Location: [place];  Service: Urology;  Laterality: N/A;  Cystoscopy with balloon dilation. Insertion of coude catheter.  . Craniotomy Right 04/19/2014    Procedure: CRANIOTOMY HEMATOMA EVACUATION SUBDURAL;  Surgeon: [name], MD;  Location: [place] [facility];  Service: Neurosurgery;  Laterality: Right;  right  . Craniotomy Right 04/23/2014    Procedure: Craniotomy for Intracerebral Hemorrhage;  Surgeon: [name], MD;  Location: [place] [facility];  Service: Neurosurgery;  Laterality: Right;  . 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 Coronary artery bypass graft  12/24/2008    4 vessel  . Coronary artery bypass graft  2012?  Marland Kitchen Craniotomy N/A 09/13/2014    Procedure: CRANIOTOMY BONE FLAP/PROSTHETIC PLATE;  Surgeon: [name], MD;  Location: [place] [facility];  Service: Neurosurgery;  Laterality: N/A;  . Video bronchoscopy with endobronchial ultrasound N/A 11/09/2014    Procedure: VIDEO BRONCHOSCOPY WITH ENDOBRONCHIAL ULTRASOUND;  Surgeon: Collene Gobble, MD;  Location: [place];  Service: Thoracic;  Laterality: N/A;    has HYPERLIPIDEMIA-MIXED; Coronary atherosclerosis of native coronary artery;  ATRIAL FIBRILLATION; SYSTOLIC HEART FAILURE, CHRONIC; SYSTOLIC HEART FAILURE, ACUTE ON CHRONIC; Skin lesion; Hernia; Recurrent ventral hernia; Encounter for therapeutic drug monitoring; SDH (subdural hematoma); Accelerated hypertension; Alcohol abuse; Edema; Anxiety state, unspecified; Intracerebral mass; Status post craniotomy; History of cranioplasty; Nodule of right lung; COPD mixed type; Hepatic cirrhosis; Non-small cell carcinoma of lung, stage 3; Rash; DOE (dyspnea on exertion); Hoarseness of voice; Antineoplastic chemotherapy induced pancytopenia;  Renal insufficiency; Dehydration; Hypoalbuminemia; Weakness; and Hypotension on his problem list.    has No Known Allergies.    Medication List       This list is accurate as of: 04/25/15 11:59 PM.  Always use your most recent med list.               albuterol 108 (90 BASE) MCG/ACT inhaler  Commonly known as:  PROVENTIL HFA;VENTOLIN HFA  Inhale 2 puffs into the lungs every 4 (four) hours as needed for wheezing or shortness of breath.     aspirin 81 MG tablet  Take 81 mg by mouth daily.     ciprofloxacin 500 MG tablet  Commonly known as:  CIPRO  Take 1 tablet (500 mg total) by mouth 2 (two) times daily.     FLUoxetine 20 MG capsule  Commonly known as:  PROZAC  Take 1 capsule (20 mg total) by mouth daily.     furosemide 40 MG tablet  Commonly known as:  LASIX  Take 1.5 tablets (60 mg total) by mouth daily.     hyaluronate sodium Gel  Apply 1 application topically 2 (two) times daily.     levETIRAcetam 500 MG tablet  Commonly known as:  KEPPRA  Take 1 tablet (500 mg total) by mouth 2 (two) times daily.     lidocaine-prilocaine cream  Commonly known as:  EMLA  Apply 1 application topically as needed.     metoprolol 50 MG tablet  Commonly known as:  LOPRESSOR  Take 1 tablet (50 mg total) by mouth 2 (two) times daily.     multivitamin with minerals Tabs tablet  Take 1 tablet by mouth daily.     pantoprazole 40 MG tablet  Commonly known as:  PROTONIX  Take 1 tablet (40 mg total) by mouth at bedtime.     potassium chloride SA 20 MEQ tablet  Commonly known as:  K-DUR,KLOR-CON  Take 1 tablet (20 mEq total) by mouth daily.     PRESCRIPTION MEDICATION  Last dose 01/03/15. At the cancer center. Dr Julien Nordmann     simvastatin 40 MG tablet  Commonly known as:  ZOCOR  Take 1 tablet (40 mg total) by mouth every evening.         PHYSICAL EXAMINATION  Oncology Vitals 04/25/2015 04/25/2015 04/25/2015 04/25/2015 04/22/2015 04/21/2015 04/20/2015  Height - - - - 173 cm 173 cm -    Weight - - - - 113.399 kg 112.946 kg -  Weight (lbs) - - - - 250 lbs 249 lbs -  BMI (kg/m2) - - - - 38.01 kg/m2 37.86 kg/m2 -  Temp 97.8 97.9 - - - - 98.3  Pulse 96 108 80 79 112 110 108  Resp - 20 - - - - -  SpO2 99 100 - - - 96 -  BSA (m2) - - - - 2.33 m2 2.33 m2 -   BP Readings from Last 3 Encounters:  04/25/15 103/83  04/25/15 91/52  04/22/15 110/60    Physical Exam  Constitutional: He is oriented to person, place, and time. He  appears dehydrated. He appears unhealthy.  HENT:  Head: Normocephalic and atraumatic.  Mouth/Throat: Oropharynx is clear and moist.  Eyes: Conjunctivae and EOM are normal. Pupils are equal, round, and reactive to light. Right eye exhibits no discharge. Left eye exhibits no discharge. No scleral icterus.  Neck: Normal range of motion. Neck supple. No JVD present. No tracheal deviation present. No thyromegaly present.  Cardiovascular: Normal rate, regular rhythm, normal heart sounds and intact distal pulses.   Pulmonary/Chest: Effort normal and breath sounds normal. No respiratory distress. He has no wheezes. He has no rales. He exhibits no tenderness.  Abdominal: Soft. Bowel sounds are normal. He exhibits no distension and no mass. There is no tenderness. There is no rebound and no guarding.  Musculoskeletal: Normal range of motion. He exhibits edema. He exhibits no tenderness.  Trace edema to bilateral ankles.  Lymphadenopathy:    He has no cervical adenopathy.  Neurological: He is alert and oriented to person, place, and time.  Patient is a lady with the assistance of a cane today; but continues to feel fairly unsteady while ambulating.  Skin: Skin is warm and dry. No rash noted. No erythema. There is pallor.  Psychiatric: Affect normal.  Nursing note and vitals reviewed.   LABORATORY DATA:. Appointment on 04/25/2015  Component Date Value Ref Range Status  . WBC 04/25/2015 0.9* 4.0 - 10.3 10e3/uL Final  . NEUT# 04/25/2015 0.4* 1.5 - 6.5 10e3/uL  Final  . HGB 04/25/2015 9.4* 13.0 - 17.1 g/dL Final  . HCT 04/25/2015 27.8* 38.4 - 49.9 % Final  . Platelets 04/25/2015 55* 140 - 400 10e3/uL Final  . MCV 04/25/2015 95.1  79.3 - 98.0 fL Final  . MCH 04/25/2015 32.2  27.2 - 33.4 pg Final  . MCHC 04/25/2015 33.9  32.0 - 36.0 g/dL Final  . RBC 04/25/2015 2.92* 4.20 - 5.82 10e6/uL Final  . RDW 04/25/2015 15.7* 11.0 - 14.6 % Final  . lymph# 04/25/2015 0.4* 0.9 - 3.3 10e3/uL Final  . MONO# 04/25/2015 0.2  0.1 - 0.9 10e3/uL Final  . Eosinophils Absolute 04/25/2015 0.0  0.0 - 0.5 10e3/uL Final  . Basophils Absolute 04/25/2015 0.0  0.0 - 0.1 10e3/uL Final  . NEUT% 04/25/2015 39.8  39.0 - 75.0 % Final  . LYMPH% 04/25/2015 39.3  14.0 - 49.0 % Final  . MONO% 04/25/2015 17.5* 0.0 - 14.0 % Final  . EOS% 04/25/2015 2.1  0.0 - 7.0 % Final  . BASO% 04/25/2015 1.3  0.0 - 2.0 % Final  . Sodium 04/25/2015 139  136 - 145 mEq/L Final  . Potassium 04/25/2015 3.8  3.5 - 5.1 mEq/L Final  . Chloride 04/25/2015 107  98 - 109 mEq/L Final  . CO2 04/25/2015 20* 22 - 29 mEq/L Final  . Glucose 04/25/2015 158* 70 - 140 mg/dl Final  . BUN 04/25/2015 40.5* 7.0 - 26.0 mg/dL Final  . Creatinine 04/25/2015 1.5* 0.7 - 1.3 mg/dL Final  . Total Bilirubin 04/25/2015 0.72  0.20 - 1.20 mg/dL Final  . Alkaline Phosphatase 04/25/2015 79  40 - 150 U/L Final  . AST 04/25/2015 19  5 - 34 U/L Final  . ALT 04/25/2015 18  0 - 55 U/L Final  . Total Protein 04/25/2015 6.3* 6.4 - 8.3 g/dL Final  . Albumin 04/25/2015 3.1* 3.5 - 5.0 g/dL Final  . Calcium 04/25/2015 8.4  8.4 - 10.4 mg/dL Final  . Anion Gap 04/25/2015 13* 3 - 11 mEq/L Final  . EGFR 04/25/2015 49* >90 ml/min/1.73 m2  Final   eGFR is calculated using the CKD-EPI Creatinine Equation (2009)     RADIOGRAPHIC STUDIES: No results found.  ASSESSMENT/PLAN:    Non-small cell carcinoma of lung, stage 3 Patient received his last cycle of paclitaxel/carboplatin chemotherapy on 04/18/2015.  He is scheduled for labs on  05/03/2015.  He is scheduled for labs, follow up visit, and his next chemotherapy on 05/09/2015.   Antineoplastic chemotherapy induced pancytopenia WBC, 0.9, ANC 0.4, hemoglobin 9.4, platelet count 55.  Patient denies any recent fevers or chills; but will prescribe Cipro anabiotic prophylactically.  Patient is complaining of increased fatigue and weakness; which is most likely secondary to both chemotherapy and anemia.  Platelet count has decreased; the patient denies any worsening issues with either easy bleeding or bruising.  Will continue to monitor closely.   Renal insufficiency Creatinine increased to 1.5 today.  Most likely, this is secondary to mild dehydration.  Will continue to monitor closely.   Dehydration Patient is complaining of increased fatigue and weakness since his last chemotherapy.  He also is complaining of decreased appetite and poor oral intake.  He feels slightly dehydrated today.  Also, patient's blood pressure on initial check was low at 91/52.  Patient was given 1 L normal saline IV fluid rehydration while cancer Center today.  He was also encouraged to push fluids at home is much as possible.   Hypoalbuminemia Albumen has decreased to 3.1.  Patient was encouraged to push protein in his diet is much as possible.   Weakness Patient is complaining of increased fatigue/weakness since his last chemotherapy.  He was found to be slightly more anemic today with a hemoglobin of 9.4.  He also feels slightly dehydrated today; and will receive some IV fluid rehydration.  Patient is finding it much more difficult to ambulate within this past week or so.  He has been trying to ambulate with the assistance of a cane; and continues to feel unsteady while walking.  There is concern for increased risk of falls.  Prescription was written for patient for a light weight wheelchair to assist in performing daily activities.  Patient is able to self propel in a  wheelchair.     Hypotension Blood pressure on initial check at the cancer Center was 91/52; and on recheck blood pressure down to 86/47.  Patient has been complaining of increased fatigue/weakness since his last chemotherapy.  Patient's hgb has decreased to 9.4 as well.  Patient denies any recent fevers or chills.  Patient states that he has a history of hypertension; has recently seen both his primary care provider and his cardiologist-he manages his blood pressure medications.  He states that one of his blood pressure medication has been eliminated from his regimen due to issues with hypotension.  Patient continues to take Lasix 60 mg every day, and Lopressor 50 mg twice daily.  Patient does appear mildly dehydrated today; and received 1 L normal saline IV fluid rehydration.  Following the completion of patient's IV fluids.-Patient's blood pressure improved to 103/83.  Advised patient to change positions slowly; to avoid any issues with dizziness secondary to orthostatic hypotension.  Also advised patient and his wife that they should call both her primary care provider and patient's cardiologist to review continued, chronic low blood pressures in case they would like to adjust patient's hypertension medications further.  Patient was also encouraged to check his blood pressure at home; and keep a documentation record of blood pressures.    Patient stated understanding of all  instructions; and was in agreement with this plan of care. The patient knows to call the clinic with any problems, questions or concerns.   This was a shared visit with Dr. Julien Nordmann today.  Total time spent with patient was 40 minutes;  with greater than 75 percent of that time spent in face to face counseling regarding patient's symptoms,  and coordination of care and follow up.  Disclaimer: This note was dictated with voice recognition software. Similar sounding words can inadvertently be transcribed and may not be  corrected upon review.   Drue Second, NP 04/27/2015   ADDENDUM: Hematology/Oncology Attending: I had a face to face encounter with the patient today. I recommended his care plan. This is a very pleasant 69 years old white male with a stage IIIa non-small cell lung cancer status post concurrent chemoradiation with weekly carboplatin and paclitaxel and currently undergoing consolidation chemotherapy with carboplatin and paclitaxel every 3 weeks. The patient had lack of appetite recently with poor by mouth intake and he was very dehydrated today with some dizzy spells. He presented to the symptom management clinic for evaluation. We will arrange for the patient to receive 1 L of normal saline today. We also advised the patient to contact his cardiologist for adjustment of his antihypertensives medication to avoid any hypotensive episodes. He would come back for follow-up visit as previously scheduled with the next cycle of his chemotherapy. He was advised to call immediately if he has any concerning symptoms in the interval.  Disclaimer: This note was dictated with voice recognition software. Similar sounding words can inadvertently be transcribed and may be missed upon review. Eilleen Kempf., MD 04/27/2015

## 2015-04-27 NOTE — Telephone Encounter (Signed)
Discussed pt with Richardson Dopp PA-C prior to calling pt's wife. He would like the pt to come into the office tomorrow for a BP check and EKG.  Scott request that he be paged to make decision about pt's plan.   I spoke with the pt's wife and they are currently in [employer].  The pt is sitting in the front of the store due to difficulty walking.  Per the pt's wife she has only got an error message when checking the pt's BP today.  She also had them check his BP at [facility] with automatic cuff and it came up as an error.  She said the pt seems to feel better than what he did on Monday.  I advised her to have the pt eat a salty snack and drink a few glasses of water when he gets home, remain off of metoprolol.  If she is able to obtain a BP reading on the pt and it remains low or the pt becomes symptomatic then she can contact our on-call staff or proceed to the ER.  She agreed with plan.

## 2015-04-27 NOTE — Telephone Encounter (Signed)
If BP is truly < 646 systolic and HR is > 803, recommend starting Digoxin 0.125 mg QD with Digoxin level 1 week after starting. Richardson Dopp, PA-C   04/27/2015 11:30 PM

## 2015-04-27 NOTE — Assessment & Plan Note (Addendum)
Blood pressure on initial check at the cancer Center was 91/52; and on recheck blood pressure down to 86/47.  Patient has been complaining of increased fatigue/weakness since his last chemotherapy.  Patient's hgb has decreased to 9.4 as well.  Patient denies any recent fevers or chills.  Patient states that he has a history of hypertension; has recently seen both his primary care provider and his cardiologist-he manages his blood pressure medications.  He states that one of his blood pressure medication has been eliminated from his regimen due to issues with hypotension.  Patient continues to take Lasix 60 mg every day, and Lopressor 50 mg twice daily.  Patient does appear mildly dehydrated today; and received 1 L normal saline IV fluid rehydration.  Following the completion of patient's IV fluids.-Patient's blood pressure improved to 103/83.  Advised patient to change positions slowly; to avoid any issues with dizziness secondary to orthostatic hypotension.  Also advised patient and his wife that they should call both her primary care provider and patient's cardiologist to review continued, chronic low blood pressures in case they would like to adjust patient's hypertension medications further.  Patient was also encouraged to check his blood pressure at home; and keep a documentation record of blood pressures.

## 2015-04-27 NOTE — Assessment & Plan Note (Signed)
Patient is complaining of increased fatigue/weakness since his last chemotherapy.  He was found to be slightly more anemic today with a hemoglobin of 9.4.  He also feels slightly dehydrated today; and will receive some IV fluid rehydration.  Patient is finding it much more difficult to ambulate within this past week or so.  He has been trying to ambulate with the assistance of a cane; and continues to feel unsteady while walking.  There is concern for increased risk of falls.  Prescription was written for patient for a light weight wheelchair to assist in performing daily activities.  Patient is able to self propel in a wheelchair.

## 2015-04-27 NOTE — Telephone Encounter (Signed)
Spoke to pt wife- pt has been monitoring his BP. He reports that his last reading was 98/68. He will contact his cardiologist this afternoon. Pt has not been taking any blood pressure medication since starting the abx (Monday) Pt is feeling pretty good today. He is "out and about at [employer]".

## 2015-04-27 NOTE — Assessment & Plan Note (Signed)
WBC, 0.9, ANC 0.4, hemoglobin 9.4, platelet count 55.  Patient denies any recent fevers or chills; but will prescribe Cipro anabiotic prophylactically.  Patient is complaining of increased fatigue and weakness; which is most likely secondary to both chemotherapy and anemia.  Platelet count has decreased; the patient denies any worsening issues with either easy bleeding or bruising.  Will continue to monitor closely.

## 2015-04-27 NOTE — Assessment & Plan Note (Signed)
Albumen has decreased to 3.1.  Patient was encouraged to push protein in his diet is much as possible.

## 2015-04-28 ENCOUNTER — Encounter (HOSPITAL_COMMUNITY): Payer: Self-pay | Admitting: Emergency Medicine

## 2015-04-28 ENCOUNTER — Emergency Department (HOSPITAL_COMMUNITY): Payer: Medicare Other

## 2015-04-28 ENCOUNTER — Other Ambulatory Visit: Payer: Self-pay

## 2015-04-28 ENCOUNTER — Inpatient Hospital Stay (HOSPITAL_COMMUNITY)
Admission: EM | Admit: 2015-04-28 | Discharge: 2015-05-02 | DRG: 308 | Disposition: A | Discharge status: Home / Self Care | Payer: Medicare Other | Attending: Internal Medicine | Admitting: Internal Medicine

## 2015-04-28 ENCOUNTER — Ambulatory Visit (INDEPENDENT_AMBULATORY_CARE_PROVIDER_SITE_OTHER): Payer: BLUE CROSS/BLUE SHIELD

## 2015-04-28 ENCOUNTER — Telehealth: Payer: Self-pay | Admitting: *Deleted

## 2015-04-28 VITALS — BP 104/62 | HR 161 | Ht 68.0 in | Wt 247.8 lb

## 2015-04-28 DIAGNOSIS — C3411 Malignant neoplasm of upper lobe, right bronchus or lung: Secondary | ICD-10-CM | POA: Diagnosis present

## 2015-04-28 DIAGNOSIS — D61810 Antineoplastic chemotherapy induced pancytopenia: Secondary | ICD-10-CM | POA: Diagnosis present

## 2015-04-28 DIAGNOSIS — Z9221 Personal history of antineoplastic chemotherapy: Secondary | ICD-10-CM | POA: Diagnosis not present

## 2015-04-28 DIAGNOSIS — I429 Cardiomyopathy, unspecified: Secondary | ICD-10-CM | POA: Diagnosis present

## 2015-04-28 DIAGNOSIS — I2510 Atherosclerotic heart disease of native coronary artery without angina pectoris: Secondary | ICD-10-CM | POA: Diagnosis present

## 2015-04-28 DIAGNOSIS — T451X5A Adverse effect of antineoplastic and immunosuppressive drugs, initial encounter: Secondary | ICD-10-CM | POA: Diagnosis not present

## 2015-04-28 DIAGNOSIS — R0602 Shortness of breath: Secondary | ICD-10-CM

## 2015-04-28 DIAGNOSIS — J189 Pneumonia, unspecified organism: Secondary | ICD-10-CM | POA: Diagnosis not present

## 2015-04-28 DIAGNOSIS — C3410 Malignant neoplasm of upper lobe, unspecified bronchus or lung: Secondary | ICD-10-CM | POA: Diagnosis present

## 2015-04-28 DIAGNOSIS — R490 Dysphonia: Secondary | ICD-10-CM | POA: Diagnosis present

## 2015-04-28 DIAGNOSIS — I5022 Chronic systolic (congestive) heart failure: Secondary | ICD-10-CM | POA: Diagnosis not present

## 2015-04-28 DIAGNOSIS — R42 Dizziness and giddiness: Secondary | ICD-10-CM | POA: Diagnosis not present

## 2015-04-28 DIAGNOSIS — J449 Chronic obstructive pulmonary disease, unspecified: Secondary | ICD-10-CM | POA: Diagnosis present

## 2015-04-28 DIAGNOSIS — Z8673 Personal history of transient ischemic attack (TIA), and cerebral infarction without residual deficits: Secondary | ICD-10-CM

## 2015-04-28 DIAGNOSIS — Y95 Nosocomial condition: Secondary | ICD-10-CM | POA: Diagnosis present

## 2015-04-28 DIAGNOSIS — Z7982 Long term (current) use of aspirin: Secondary | ICD-10-CM | POA: Diagnosis not present

## 2015-04-28 DIAGNOSIS — K219 Gastro-esophageal reflux disease without esophagitis: Secondary | ICD-10-CM | POA: Diagnosis present

## 2015-04-28 DIAGNOSIS — I10 Essential (primary) hypertension: Secondary | ICD-10-CM | POA: Diagnosis not present

## 2015-04-28 DIAGNOSIS — K7460 Unspecified cirrhosis of liver: Secondary | ICD-10-CM | POA: Diagnosis present

## 2015-04-28 DIAGNOSIS — I252 Old myocardial infarction: Secondary | ICD-10-CM

## 2015-04-28 DIAGNOSIS — E785 Hyperlipidemia, unspecified: Secondary | ICD-10-CM | POA: Diagnosis present

## 2015-04-28 DIAGNOSIS — I482 Chronic atrial fibrillation, unspecified: Secondary | ICD-10-CM

## 2015-04-28 DIAGNOSIS — Z951 Presence of aortocoronary bypass graft: Secondary | ICD-10-CM | POA: Diagnosis not present

## 2015-04-28 DIAGNOSIS — I4891 Unspecified atrial fibrillation: Secondary | ICD-10-CM

## 2015-04-28 DIAGNOSIS — Z87891 Personal history of nicotine dependence: Secondary | ICD-10-CM

## 2015-04-28 LAB — CBC
HCT: 26.7 % — ABNORMAL LOW (ref 39.0–52.0)
Hemoglobin: 9.1 g/dL — ABNORMAL LOW (ref 13.0–17.0)
MCH: 31.4 pg (ref 26.0–34.0)
MCHC: 34.1 g/dL (ref 30.0–36.0)
MCV: 92.1 fL (ref 78.0–100.0)
PLATELETS: 62 10*3/uL — AB (ref 150–400)
RBC: 2.9 MIL/uL — AB (ref 4.22–5.81)
RDW: 15.4 % (ref 11.5–15.5)
WBC: 8 10*3/uL (ref 4.0–10.5)

## 2015-04-28 LAB — BASIC METABOLIC PANEL
ANION GAP: 11 (ref 5–15)
BUN: 22 mg/dL — ABNORMAL HIGH (ref 6–20)
CALCIUM: 9.1 mg/dL (ref 8.9–10.3)
CO2: 21 mmol/L — AB (ref 22–32)
Chloride: 107 mmol/L (ref 101–111)
Creatinine, Ser: 1.36 mg/dL — ABNORMAL HIGH (ref 0.61–1.24)
GFR calc non Af Amer: 52 mL/min — ABNORMAL LOW (ref >60)
GFR, EST AFRICAN AMERICAN: 60 mL/min — AB (ref >60)
Glucose, Bld: 124 mg/dL — ABNORMAL HIGH (ref 65–99)
Potassium: 3.8 mmol/L (ref 3.5–5.1)
Sodium: 139 mmol/L (ref 135–145)

## 2015-04-28 LAB — MRSA PCR SCREENING: MRSA BY PCR: NEGATIVE

## 2015-04-28 LAB — BRAIN NATRIURETIC PEPTIDE: B Natriuretic Peptide: 293.9 pg/mL — ABNORMAL HIGH (ref 0.0–100.0)

## 2015-04-28 LAB — PROCALCITONIN: Procalcitonin: 0.13 ng/mL

## 2015-04-28 LAB — LACTIC ACID, PLASMA: Lactic Acid, Venous: 3 mmol/L (ref 0.5–2.0)

## 2015-04-28 LAB — I-STAT TROPONIN, ED: TROPONIN I, POC: 0.05 ng/mL (ref 0.00–0.08)

## 2015-04-28 LAB — TSH: TSH: 1.612 u[IU]/mL (ref 0.350–4.500)

## 2015-04-28 MED ORDER — ALBUTEROL SULFATE (2.5 MG/3ML) 0.083% IN NEBU: 3.0000 mL | INHALATION_SOLUTION | RESPIRATORY_TRACT | Status: DC | PRN

## 2015-04-28 MED ORDER — RADIAPLEXRX EX GEL: 1.0000 "application " | Freq: Two times a day (BID) | CUTANEOUS | Status: DC

## 2015-04-28 MED ORDER — SIMVASTATIN 40 MG PO TABS
40.0000 mg | ORAL_TABLET | Freq: Every day | ORAL | Status: DC
  Administered 2015-04-28 – 2015-05-01 (×4): 40 mg via ORAL
  Filled 2015-04-28 (×4): qty 1

## 2015-04-28 MED ORDER — CEFEPIME HCL 2 G IJ SOLR
2.0000 g | Freq: Three times a day (TID) | INTRAMUSCULAR | Status: DC
  Administered 2015-04-28: 2 g via INTRAVENOUS
  Filled 2015-04-28 (×2): qty 2

## 2015-04-28 MED ORDER — ACETAMINOPHEN 325 MG PO TABS: 650.0000 mg | ORAL_TABLET | ORAL | Status: DC | PRN

## 2015-04-28 MED ORDER — SODIUM CHLORIDE 0.9 % IV BOLUS (SEPSIS)
250.0000 mL | Freq: Once | INTRAVENOUS | Status: AC
  Administered 2015-04-28: 250 mL via INTRAVENOUS

## 2015-04-28 MED ORDER — ADULT MULTIVITAMIN W/MINERALS CH
1.0000 | ORAL_TABLET | Freq: Every day | ORAL | Status: DC
  Administered 2015-04-29 – 2015-05-02 (×4): 1 via ORAL
  Filled 2015-04-28 (×4): qty 1

## 2015-04-28 MED ORDER — LEVETIRACETAM 500 MG PO TABS
500.0000 mg | ORAL_TABLET | Freq: Two times a day (BID) | ORAL | Status: DC
  Administered 2015-04-28 – 2015-05-02 (×6): 500 mg via ORAL
  Filled 2015-04-28 (×7): qty 1

## 2015-04-28 MED ORDER — VANCOMYCIN HCL 10 G IV SOLR
2000.0000 mg | Freq: Once | INTRAVENOUS | Status: AC
  Administered 2015-04-28: 2000 mg via INTRAVENOUS
  Filled 2015-04-28: qty 2000

## 2015-04-28 MED ORDER — VANCOMYCIN HCL IN DEXTROSE 750-5 MG/150ML-% IV SOLN
750.0000 mg | Freq: Two times a day (BID) | INTRAVENOUS | Status: DC
  Administered 2015-04-29 – 2015-05-02 (×7): 750 mg via INTRAVENOUS
  Filled 2015-04-28 (×8): qty 150

## 2015-04-28 MED ORDER — DIGOXIN 0.25 MG/ML IJ SOLN
0.5000 mg | INTRAMUSCULAR | Status: AC
  Administered 2015-04-28: 0.5 mg via INTRAVENOUS
  Filled 2015-04-28: qty 2

## 2015-04-28 MED ORDER — POTASSIUM CHLORIDE CRYS ER 20 MEQ PO TBCR
40.0000 meq | EXTENDED_RELEASE_TABLET | Freq: Once | ORAL | Status: AC
  Administered 2015-04-28: 40 meq via ORAL
  Filled 2015-04-28: qty 2

## 2015-04-28 MED ORDER — DIGOXIN 0.25 MG/ML IJ SOLN
0.5000 mg | Freq: Once | INTRAMUSCULAR | Status: AC
  Administered 2015-04-28: 0.5 mg via INTRAVENOUS
  Filled 2015-04-28 (×3): qty 2

## 2015-04-28 MED ORDER — FLUOXETINE HCL 20 MG PO CAPS
20.0000 mg | ORAL_CAPSULE | Freq: Every day | ORAL | Status: DC
  Administered 2015-04-28 – 2015-05-02 (×5): 20 mg via ORAL
  Filled 2015-04-28 (×5): qty 1

## 2015-04-28 MED ORDER — DEXTROSE 5 % IV SOLN
2.0000 g | Freq: Three times a day (TID) | INTRAVENOUS | Status: DC
  Administered 2015-04-29 – 2015-05-01 (×8): 2 g via INTRAVENOUS
  Filled 2015-04-28 (×11): qty 2

## 2015-04-28 MED ORDER — DILTIAZEM LOAD VIA INFUSION
10.0000 mg | Freq: Once | INTRAVENOUS | Status: AC
  Administered 2015-04-28: 10 mg via INTRAVENOUS
  Filled 2015-04-28: qty 10

## 2015-04-28 MED ORDER — METOPROLOL TARTRATE 1 MG/ML IV SOLN
5.0000 mg | Freq: Once | INTRAVENOUS | Status: AC
  Administered 2015-04-28: 5 mg via INTRAVENOUS
  Filled 2015-04-28: qty 5

## 2015-04-28 MED ORDER — DEXTROSE 5 % IV SOLN
5.0000 mg/h | INTRAVENOUS | Status: DC
  Administered 2015-04-28: 5 mg/h via INTRAVENOUS

## 2015-04-28 MED ORDER — SODIUM CHLORIDE 0.9 % IV SOLN
INTRAVENOUS | Status: DC
  Administered 2015-04-29 – 2015-05-01 (×2): via INTRAVENOUS

## 2015-04-28 MED ORDER — ASPIRIN EC 81 MG PO TBEC
81.0000 mg | DELAYED_RELEASE_TABLET | Freq: Every day | ORAL | Status: DC
  Administered 2015-04-29 – 2015-04-30 (×2): 81 mg via ORAL
  Filled 2015-04-28 (×4): qty 1

## 2015-04-28 MED ORDER — PANTOPRAZOLE SODIUM 40 MG PO TBEC
40.0000 mg | DELAYED_RELEASE_TABLET | Freq: Every day | ORAL | Status: DC
  Administered 2015-04-28 – 2015-05-01 (×4): 40 mg via ORAL
  Filled 2015-04-28 (×4): qty 1

## 2015-04-28 MED ORDER — ONDANSETRON HCL 4 MG/2ML IJ SOLN: 4.0000 mg | Freq: Four times a day (QID) | INTRAMUSCULAR | Status: DC | PRN

## 2015-04-28 MED ORDER — POTASSIUM CHLORIDE CRYS ER 20 MEQ PO TBCR
20.0000 meq | EXTENDED_RELEASE_TABLET | Freq: Every day | ORAL | Status: DC
  Administered 2015-04-29 – 2015-04-30 (×2): 20 meq via ORAL
  Filled 2015-04-28 (×2): qty 1

## 2015-04-28 NOTE — ED Notes (Signed)
Pharmacy called for cardiazem

## 2015-04-28 NOTE — Consult Note (Signed)
CARDIOLOGY CONSULT NOTE   Patient ID: Alejandro [name] MRN: [id number] DOB/AGE: 70-Jan-1947 70 y.o.  Admit date: 04/28/2015  Primary Physician   [name], MD Primary Cardiologist   Dr [name] Reason for Consultation   Rapid atrial fib  XTA:[id number] B Malenfant is a 69 y.o. year old male with a history of CABG, S-CHF, HTN, chronic Afib, HL, ETOH, and NSC Lung CA, stage 3. Traumatic SDH so no coumadin.   Chemo on 05/16  05/20 saw Richardson Dopp, c/o SOB, stress test and echo reviewed and no further workup planned. EF down from previous, felt 2nd elevated HR, Coreg 9.375 mg bid changed to Lopressor 50 mg bid. Volume status OK.  05/25, He saw his oncology NP, and was taken off his Lopressor 50 mg bid because his BP was low, 80/40. His HR at that time was 96. C/o SOB and fatigue, felt 2nd low BP. Weight has been stable.   Today, his wife got an error message when she tried to check his BP. She called the office and was told to come in for an ECG. He was also SOB with minimal exertion. His HR was very high and he was told to come to the ER. In the ER, his HR was > 150. He was given Lopressor 5 mg x 1 and his BP dropped, so he was started on low-dose Cardizem at 5 mg/hr. Currently his SBP is approximately 100 and his HR is 100s-130s.    Mr Marhefka has noticed gradually increasing DOE but denies LE edema. He does not weigh himself at home. He denies PND or orthopnea, yet sleeps in a recliner chronically. He has been coughing more, productive of a small amount of whitish sputum, he denies fever or chills. No palpitations, no presyncope or syncope.  Past Medical History  Diagnosis Date  . Systolic heart failure   . 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 Dyslipidemia   . HTN (hypertension)   . Atrial fibrillation   . Tobacco abuse   . Alcohol abuse   . H/O hiatal hernia   . Myocardial infarction   . Dysrhythmia     atrial fib  . Stroke 5/15  . CHF (congestive heart failure)     . Shortness of breath     occ  . GERD (gastroesophageal reflux disease)   . S/P chemotherapy, time since 4-12 weeks   . Cancer     around nose.  New dx of lung cancer  . Non-small cell carcinoma of lung, stage 3 12/16/2014     Past Surgical History  Procedure Laterality Date  . Umbilical hernia repair  2010  . Hernia repair  2012  . Ventral hernia repair N/A 01/22/2013    Procedure: HERNIA REPAIR VENTRAL ADULT;  Surgeon: [name], MD;  Location: [facility];  Service: General;  Laterality: N/A;  . Application of a-cell of chest/abdomen N/A 01/22/2013    Procedure: APPLICATION OF A-CELL OF CHEST/ABDOMEN;  Surgeon: [name], MD;  Location: [facility];  Service: General;  Laterality: N/A;  . Cystoscopy N/A 01/22/2013    Procedure: Consuela Mimes;  Surgeon: [name], MD;  Location: [place];  Service: Urology;  Laterality: N/A;  Cystoscopy with balloon dilation. Insertion of coude catheter.  . Craniotomy Right 04/19/2014    Procedure: CRANIOTOMY HEMATOMA EVACUATION SUBDURAL;  Surgeon: [name], MD;  Location: [facility];  Service: Neurosurgery;  Laterality:  Right;  right  . Craniotomy Right 04/23/2014    Procedure: Craniotomy for Intracerebral Hemorrhage;  Surgeon: [name], MD;  Location: [facility] [facility];  Service: Neurosurgery;  Laterality: Right;  . 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 Coronary artery bypass graft  12/24/2008    4 vessel  . Coronary artery bypass graft  2012?  Marland Kitchen Craniotomy N/A 09/13/2014    Procedure: CRANIOTOMY BONE FLAP/PROSTHETIC PLATE;  Surgeon: [name], MD;  Location: [facility];  Service: Neurosurgery;  Laterality: N/A;  . Video bronchoscopy with endobronchial ultrasound N/A 11/09/2014    Procedure: VIDEO BRONCHOSCOPY WITH ENDOBRONCHIAL ULTRASOUND;  Surgeon: Collene Gobble, MD;  Location[facility];   Service: Thoracic;  Laterality: N/A;    No Known Allergies  I have reviewed the patient's current medications . [START ON 04/29/2015] vancomycin  750 mg Intravenous Q12H   . sodium chloride    . ceFEPime (MAXIPIME) IV    . diltiazem (CARDIZEM) infusion 5 mg/hr (04/28/15 1414)  . vancomycin 2,000 mg (04/28/15 1608)     Medication Sig  albuterol (PROVENTIL HFA;VENTOLIN HFA) 108 (90 BASE) MCG/ACT inhaler Inhale 2 puffs into the lungs every 4 (four) hours as needed for wheezing or shortness of breath.  aspirin 81 MG tablet Take 81 mg by mouth daily.  ciprofloxacin (CIPRO) 500 MG tablet Take 1 tablet (500 mg total) by mouth 2 (two) times daily.  FLUoxetine (PROZAC) 20 MG capsule Take 1 capsule (20 mg total) by mouth daily.  furosemide (LASIX) 40 MG tablet Take 1.5 tablets (60 mg total) by mouth daily.  levETIRAcetam (KEPPRA) 500 MG tablet Take 1 tablet (500 mg total) by mouth 2 (two) times daily.  lidocaine-prilocaine (EMLA) cream Apply 1 application topically as needed. Patient taking differently: Apply 1 application topically as needed (pain).   Multiple Vitamin (MULTIVITAMIN WITH MINERALS) TABS tablet Take 1 tablet by mouth daily.  pantoprazole (PROTONIX) 40 MG tablet Take 1 tablet (40 mg total) by mouth at bedtime.  potassium chloride SA (K-DUR,KLOR-CON) 20 MEQ tablet Take 1 tablet (20 mEq total) by mouth daily.  simvastatin (ZOCOR) 40 MG tablet Take 1 tablet (40 mg total) by mouth every evening.  UNABLE TO FIND Med Name:Wheelchair Per medical necessity patient requires wheelchair to all perform daily activities. Pt is able to self propel chair. Unable to use can or walker due to balance     History   Social History  . Marital Status: Married    Spouse Name: N/A  . Number of Children: 2  . Years of Education: N/A   Occupational History  . [employer] - retired    Social History Main Topics  . Smoking status: Former Smoker -- 1.00 packs/day for 50 years     Types: Cigarettes    Quit date: 04/19/2013  . Smokeless tobacco: Never Used  . Alcohol Use: No     Comment: quit  . Drug Use: No  . Sexual Activity: Not on file   Other Topics Concern  . Not on file   Social History Narrative   Lives in [place], [place] with wife.    Family Status  Relation Status Death Age  . Mother Deceased   . Father Deceased   . Brother Alive   . Daughter Alive   . Daughter Alive    Family History  Problem Relation Age of Onset  . Diabetes Mother   . Heart disease Mother   . Diabetes Father   . Heart disease Father   . Heart disease Brother     s/p CABG at 46  . Colon cancer Neg Hx   . Prostate cancer Neg Hx   . Rheum arthritis Daughter   . Heart attack Mother   . Heart attack Father   . Heart attack Brother   . Stroke Neg Hx      ROS: No bleeding issues. Full 14 point review of systems complete and found to be negative unless listed above.  Physical Exam: Blood pressure 89/59, pulse 124, temperature 98.1 F (36.7 C), temperature source Oral, resp. rate 17, height '5\' 8"'$  (1.727 m), weight 248 lb 11.2 oz (112.81 kg), SpO2 99 %.  General: Well developed, well nourished, male in no acute distress Head: Eyes PERRLA, No xanthomas.   Normocephalic and atraumatic, oropharynx without edema or exudate. Dentition: poor Lungs: decreased BS bases with rales. Heart: Heart rapid and irregular rate and rhythm with S1, S2  murmur. pulses are 2+ extrem.   Neck: No carotid bruits. No lymphadenopathy.  JVD minimally elevated, difficult to assess 2nd body habitus. Abdomen: Bowel sounds present, abdomen soft and non-tender without masses or hernias noted. Ventral hernia noted Msk:  No spine or cva tenderness. No weakness, no joint deformities or effusions. Extremities: No clubbing or cyanosis. no edema.  Neuro: Alert and oriented X 3. No focal deficits noted. Psych:  Good affect, responds appropriately Skin: No rashes or lesions noted. Small healing wound top of  head.  Labs:   Lab Results  Component Value Date   WBC 8.0 04/28/2015   HGB 9.1* 04/28/2015   HCT 26.7* 04/28/2015   MCV 92.1 04/28/2015   PLT 62* 04/28/2015   No results for input(s): INR in the last 72 hours.   Recent Labs Lab 04/25/15 1219 04/28/15 1300  NA 139 139  K 3.8 3.8  CL  --  107  CO2 20* 21*  BUN 40.5* 22*  CREATININE 1.5* 1.36*  CALCIUM 8.4 9.1  PROT 6.3*  --   BILITOT 0.72  --   ALKPHOS 79  --   ALT 18  --   AST 19  --   GLUCOSE 158* 124*  ALBUMIN 3.1*  --     Recent Labs  04/28/15 1304  TROPIPOC 0.05   B NATRIURETIC PEPTIDE  Date/Time Value Ref Range Status  04/28/2015 01:00 PM 293.9* 0.0 - 100.0 pg/mL Final   PRO B NATRIURETIC PEPTIDE (BNP)  Date/Time Value Ref Range Status  04/05/2015 11:34 AM 252.0* 0.0 - 100.0 pg/mL Final  03/29/2015 11:33 AM 543.0* 0.0 - 100.0 pg/mL Final   Echo: 04/19/2015 - Left ventricle: The cavity size was normal. Wall thickness wasnormal. Systolic function was moderately to severely reduced. Theestimated ejection fraction was in the range of 30% to 35%. - Aortic valve: There was trivial regurgitation. - Mitral valve: There was mild regurgitation. - Left atrium: The atrium was severely dilated. - Right atrium: The atrium was moderately dilated.  ECG:  04/28/2015 Atrial fib, RVR  Radiology:  Dg Chest Portable 1 View 04/28/2015   CLINICAL DATA:  Dizziness.  EXAM: PORTABLE CHEST - 1 VIEW  COMPARISON:  November 29, 2014.  FINDINGS: Stable cardiomediastinal silhouette. Sternotomy wires are noted. Visualized portion of left lung appears clear. Left lung base is not included in field-of-view. Increased airspace and interstitial opacity is noted in right lung  concerning for pneumonia or edema. No definite pleural effusion or pneumothorax is noted. Right internal jugular Port-A-Cath is again noted and unchanged in position. Bony thorax is intact.  IMPRESSION: New right lung opacity is noted concerning for edema or  pneumonia. Follow-up radiographs are recommended.   Electronically Signed   By: Marijo Conception, M.D.   On: 04/28/2015 13:26    ASSESSMENT AND PLAN:   The patient was seen today by Dr Johnsie Cancel the patient evaluated and the data reviewed.  Principal Problem:   Atrial fibrillation with RVR - agree w/ IV Cardizem as BP will allow. With ?PNA on CXR, hope HR will improve once he is treated. - has had a loading dose of Dig, will repeat this and start PO in am - multiple reasons make him not an anticoag candidate. - was changed from Coreg 9.375 bid to Lopressor 50 mg bid, perhaps would tolerate a lower dose of Lopressor and HR would be OK once dig added. - will have to watch dig levels with mildly elevated Cr, GFR currently 52  Otherwise, per IM. No ongoing ischemic sx and weight is at baseline. Would limit fluid resuscitation as much as possible. Active Problems:   Dyslipidemia   Coronary atherosclerosis of native coronary artery   Chronic systolic congestive heart failure, NYHA class 3   COPD mixed type   Hepatic cirrhosis   Non-small cell carcinoma of lung, stage 3   Antineoplastic chemotherapy induced pancytopenia   Hoarseness of voice   HCAP (healthcare-associated pneumonia)   Signed: Lenoard Aden 04/28/2015 4:37 PM Beeper [phone]  Co-Sign MD  See separate progress note Cr 2.06 will hydrate and plan cath in am  Jenkins Rouge

## 2015-04-28 NOTE — ED Provider Notes (Signed)
CSN: [id number]     Arrival date & time 04/28/15  1234 History   First MD Initiated Contact with Patient 04/28/15 1244     Chief Complaint  Patient presents with  . Abnormal ECG  . Dizziness     (Consider location/radiation/quality/duration/timing/severity/associated sxs/prior Treatment) HPI Comments: 69 year old male presents as transfer from clinic with concern for tachycardia. Patient is currently on chemotherapy for lung cancer. Was seen 2 days ago in clinic. At that time was hypotensive and his Lopressor was stopped. He has a history of chronic A. Fib. Went for recheck today of blood pressure and was found to be tachycardic in the 150s and 160s. Blood pressures appropriate. Patient is endorsing some dizziness however mild. He is short of breath but states this is baseline. Patient unclear on how long he has been tachycardic. He is not taking any medications or treatments for this.   Past Medical History  Diagnosis Date  . Systolic heart failure   . 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 Dyslipidemia   . HTN (hypertension)   . Atrial fibrillation   . Tobacco abuse   . Alcohol abuse   . H/O hiatal hernia   . Myocardial infarction   . Dysrhythmia     atrial fib  . Stroke 5/15  . CHF (congestive heart failure)   . Shortness of breath     occ  . GERD (gastroesophageal reflux disease)   . S/P chemotherapy, time since 4-12 weeks   . Cancer     around nose.  New dx of lung cancer  . Non-small cell carcinoma of lung, stage 3 12/16/2014   Past Surgical History  Procedure Laterality Date  . Umbilical hernia repair  2010  . Hernia repair  2012  . Ventral hernia repair N/A 01/22/2013    Procedure: HERNIA REPAIR VENTRAL ADULT;  Surgeon: [name], MD;  Location: [facility];  Service: General;  Laterality: N/A;  . Application of a-cell of chest/abdomen N/A 01/22/2013    Procedure: APPLICATION OF A-CELL OF CHEST/ABDOMEN;  Surgeon: [name], MD;  Location:  [facility];  Service: General;  Laterality: N/A;  . Cystoscopy N/A 01/22/2013    Procedure: Consuela Mimes;  Surgeon: [name], MD;  Location: [facility];  Service: Urology;  Laterality: N/A;  Cystoscopy with balloon dilation. Insertion of coude catheter.  . Craniotomy Right 04/19/2014    Procedure: CRANIOTOMY HEMATOMA EVACUATION SUBDURAL;  Surgeon: [name], MD;  Location: [facility];  Service: Neurosurgery;  Laterality: Right;  right  . Craniotomy Right 04/23/2014    Procedure: Craniotomy for Intracerebral Hemorrhage;  Surgeon: [name], MD;  Location: [facility];  Service: Neurosurgery;  Laterality: Right;  . 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 Coronary artery bypass graft  12/24/2008    4 vessel  . Coronary artery bypass graft  2012?  Marland Kitchen Craniotomy N/A 09/13/2014    Procedure: CRANIOTOMY BONE FLAP/PROSTHETIC PLATE;  Surgeon: [name], MD;  Location: [facility];  Service: Neurosurgery;  Laterality: N/A;  . Video bronchoscopy with endobronchial ultrasound N/A 11/09/2014    Procedure: VIDEO BRONCHOSCOPY WITH ENDOBRONCHIAL ULTRASOUND;  Surgeon: Collene Gobble, MD;  Location: [facility];  Service: Thoracic;  Laterality: N/A;   Family History  Problem Relation Age of  Onset  . Diabetes Mother   . Heart disease Mother   . Diabetes Father   . Heart disease Father   . Heart disease Brother     s/p CABG at 60  . Colon cancer Neg Hx   . Prostate cancer Neg Hx   . Rheum arthritis Daughter   . Heart attack Mother   . Heart attack Father   . Heart attack Brother   . Stroke Neg Hx    History  Substance Use Topics  . Smoking status: Former Smoker -- 1.00 packs/day for 50 years    Types: Cigarettes    Quit date: 04/19/2013  . Smokeless tobacco: Never Used  . Alcohol Use: No     Comment: quit    Review of Systems  Constitutional:  Negative for fever.  HENT: Negative for congestion.   Respiratory: Positive for shortness of breath (chronic).   Cardiovascular: Negative for chest pain.  Gastrointestinal: Negative for abdominal pain.  Musculoskeletal: Negative for back pain.  Skin: Negative for rash.  Neurological: Positive for light-headedness. Negative for headaches.  Psychiatric/Behavioral: Negative for confusion.  All other systems reviewed and are negative.     Allergies  Review of patient's allergies indicates no known allergies.  Home Medications   Prior to Admission medications   Medication Sig Start Date End Date Taking? Authorizing Provider  albuterol (PROVENTIL HFA;VENTOLIN HFA) 108 (90 BASE) MCG/ACT inhaler Inhale 2 puffs into the lungs every 4 (four) hours as needed for wheezing or shortness of breath. 03/22/15   Collene Gobble, MD  aspirin 81 MG tablet Take 81 mg by mouth daily.    Historical Provider, MD  ciprofloxacin (CIPRO) 500 MG tablet Take 1 tablet (500 mg total) by mouth 2 (two) times daily. 04/25/15   Susanne Borders, NP  FLUoxetine (PROZAC) 20 MG capsule Take 1 capsule (20 mg total) by mouth daily. 04/15/15   Colon Branch, MD  furosemide (LASIX) 40 MG tablet Take 1.5 tablets (60 mg total) by mouth daily. 04/15/15   Colon Branch, MD  hyaluronate sodium (RADIAPLEXRX) GEL Apply 1 application topically 2 (two) times daily.    Historical Provider, MD  levETIRAcetam (KEPPRA) 500 MG tablet Take 1 tablet (500 mg total) by mouth 2 (two) times daily. 04/15/15   Colon Branch, MD  lidocaine-prilocaine (EMLA) cream Apply 1 application topically as needed. 03/28/15   Maryanna Shape, NP  metoprolol (LOPRESSOR) 50 MG tablet Take 1 tablet (50 mg total) by mouth 2 (two) times daily. Patient not taking: Reported on 04/28/2015 04/22/15   Liliane Shi, PA-C  Multiple Vitamin (MULTIVITAMIN WITH MINERALS) TABS tablet Take 1 tablet by mouth daily. 05/14/14   Lavon Paganini Angiulli, PA-C  pantoprazole (PROTONIX) 40 MG tablet Take 1  tablet (40 mg total) by mouth at bedtime. 04/15/15   Colon Branch, MD  potassium chloride SA (K-DUR,KLOR-CON) 20 MEQ tablet Take 1 tablet (20 mEq total) by mouth daily. 04/15/15   Colon Branch, MD  PRESCRIPTION MEDICATION Last dose 01/03/15. At the cancer center. Dr Julien Nordmann    Historical Provider, MD  simvastatin (ZOCOR) 40 MG tablet Take 1 tablet (40 mg total) by mouth every evening. 04/15/15   Colon Branch, MD  UNABLE TO [place] Per medical necessity patient requires wheelchair to all perform daily activities. Pt is able to self propel chair. Unable to use can or walker due to balance 04/26/15   Susanne Borders, NP   BP 94/64 mmHg  Pulse 115  Temp(Src) 98.1 F (36.7 C) (Oral)  Resp 12  Ht '5\' 8"'$  (1.727 m)  Wt 248 lb 11.2 oz (112.81 kg)  BMI 37.82 kg/m2  SpO2 100% Physical Exam  Constitutional: He is oriented to person, place, and time. He appears well-developed.  HENT:  Head: Normocephalic.  No acute trauma   Eyes: Pupils are equal, round, and reactive to light.  Neck: Normal range of motion.  Cardiovascular: Intact distal pulses.   Irregular and tachycardic  Pulmonary/Chest: Effort normal. No respiratory distress.  99% saturations room air  Abdominal: Soft. He exhibits no distension. There is no tenderness.  Musculoskeletal: Normal range of motion. He exhibits no tenderness.  Neurological: He is alert and oriented to person, place, and time. No cranial nerve deficit. He exhibits normal muscle tone.  Skin: Skin is warm.  Psychiatric: He has a normal mood and affect.  Vitals reviewed.   ED Course  Procedures (including critical care time) Labs Review Labs Reviewed  CBC - Abnormal; Notable for the following:    RBC 2.90 (*)    Hemoglobin 9.1 (*)    HCT 26.7 (*)    Platelets 62 (*)    All other components within normal limits  BASIC METABOLIC PANEL - Abnormal; Notable for the following:    CO2 21 (*)    Glucose, Bld 124 (*)    BUN 22 (*)    Creatinine, Ser 1.36  (*)    GFR calc non Af Amer 52 (*)    GFR calc Af Amer 60 (*)    All other components within normal limits  BRAIN NATRIURETIC PEPTIDE - Abnormal; Notable for the following:    B Natriuretic Peptide 293.9 (*)    All other components within normal limits  PROCALCITONIN  TSH  I-STAT TROPOININ, ED  I-STAT TROPOININ, ED    Imaging Review Dg Chest Portable 1 View  04/28/2015   CLINICAL DATA:  Dizziness.  EXAM: PORTABLE CHEST - 1 VIEW  COMPARISON:  November 29, 2014.  FINDINGS: Stable cardiomediastinal silhouette. Sternotomy wires are noted. Visualized portion of left lung appears clear. Left lung base is not included in field-of-view. Increased airspace and interstitial opacity is noted in right lung concerning for pneumonia or edema. No definite pleural effusion or pneumothorax is noted. Right internal jugular Port-A-Cath is again noted and unchanged in position. Bony thorax is intact.  IMPRESSION: New right lung opacity is noted concerning for edema or pneumonia. Follow-up radiographs are recommended.   Electronically Signed   By: Marijo Conception, M.D.   On: 04/28/2015 13:26     EKG Interpretation None      MDM  69 year old male recently taken off Lopressor arrives with tachycardia from clinic. On arrival patient is tachycardic in the 160s. Complaining of only some vague shortness of breath however is otherwise hemodynamically stable. Patient was recently taken off Lopressor secondary to hypotension. On arrival basic labs were obtained unremarkable. Chest x-ray showed serious intrathoracic pathology. Initially tried 1 dose of IV Lopressor as this was patient's long-term medication previously. Did resolve some tachycardia from 160s to 120s-130s however blood pressures dropped from systolics 366 to 440'H. As patient pressures will not tolerate metoprolol started IV Cardizem bolus and drip. Discussed with hospitalist who will admit   Final diagnoses:  Atrial fibrillation with RVR         Robynn Pane, MD 04/28/15 1438  Evelina Bucy, MD 04/28/15 1556

## 2015-04-28 NOTE — H&P (Signed)
[facility] History and Physical                                                                                    Alejandro [name], is a 69 y.o. male  MRN: [id number]   DOB - 05/04/1946  Admit Date - 04/28/2015  Outpatient Primary MD for the patient is [name], MD  Referring MD: Mingo Amber / ER  Consulting MD:  / Cardiology  With History of -  Past Medical History  Diagnosis Date  . Systolic heart failure   . 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 Dyslipidemia   . HTN (hypertension)   . Atrial fibrillation   . Tobacco abuse   . Alcohol abuse   . H/O hiatal hernia   . Myocardial infarction   . Dysrhythmia     atrial fib  . Stroke 5/15  . CHF (congestive heart failure)   . Shortness of breath     occ  . GERD (gastroesophageal reflux disease)   . S/P chemotherapy, time since 4-12 weeks   . Cancer     around nose.  New dx of lung cancer  . Non-small cell carcinoma of lung, stage 3 12/16/2014      Past Surgical History  Procedure Laterality Date  . Umbilical hernia repair  2010  . Hernia repair  2012  . Ventral hernia repair N/A 01/22/2013    Procedure: HERNIA REPAIR VENTRAL ADULT;  Surgeon: [name], MD;  Location: [place];  Service: General;  Laterality: N/A;  . Application of a-cell of chest/abdomen N/A 01/22/2013    Procedure: APPLICATION OF A-CELL OF CHEST/ABDOMEN;  Surgeon: [name], MD;  Location: [place];  Service: General;  Laterality: N/A;  . Cystoscopy N/A 01/22/2013    Procedure: Consuela Mimes;  Surgeon: [name], MD;  Location: [place];  Service: Urology;  Laterality: N/A;  Cystoscopy with balloon dilation. Insertion of coude catheter.  . Craniotomy Right 04/19/2014    Procedure: CRANIOTOMY HEMATOMA EVACUATION SUBDURAL;  Surgeon: [name], MD;  Location: [place] [facility];  Service: Neurosurgery;  Laterality: Right;  right  . Craniotomy Right 04/23/2014    Procedure: Craniotomy for Intracerebral Hemorrhage;  Surgeon: [name],  MD;  Location: [place] [facility];  Service: Neurosurgery;  Laterality: Right;  . 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 Coronary artery bypass graft  12/24/2008    4 vessel  . Coronary artery bypass graft  2012?  Marland Kitchen Craniotomy N/A 09/13/2014    Procedure: CRANIOTOMY BONE FLAP/PROSTHETIC PLATE;  Surgeon: [name], MD;  Location: [place] [facility];  Service: Neurosurgery;  Laterality: N/A;  . Video bronchoscopy with endobronchial ultrasound N/A 11/09/2014    Procedure: VIDEO BRONCHOSCOPY WITH ENDOBRONCHIAL ULTRASOUND;  Surgeon: Collene Gobble, MD;  Location: [place];  Service: Thoracic;  Laterality: N/A;    in for   Chief Complaint  Patient presents with  . Abnormal ECG  . Dizziness  HPI This is a 69 yo male w/ PMH: HTN, traumatic SDH post craniotomy,Chronic SHF/EF 35%, AF not on anticoagulation 2/2 chemo induced pancytopenia, lung CA (nonsm cell stage III) who presented to ER from cancer center 2/2 AF/RVR. 2 days prior SBP 80s so BB dc'd and given 1 L NS bolus- returned today for BP ck when found w/ RVR (150-160s).  In ER: found with AF/RVR-given IV Lopressor but this dropped SBP from 140s to low 100s so EDP changed to IV Cardizem. CXR with right fluffy infiltrate c/w edema or PNA. No edema in extremities. BNP 293, TNI 0.05, K 3.8, BUN 22 Cr 1.36 (near baseline), hgb 9.1, platelets 62,000, WBC 8,000 (in May was 900)  Pt reports chronic cough since ETT for craniotomy and assoc with chronic hoarseness, on Cipro for infection prophylaxis, he cont'd to take Lasix after BB stopped, denies fever,chills, denies poor oral intake.   Review of Systems   In addition to the HPI above,  No Fever-chills, myalgias or other constitutional symptoms No Headache, changes with Vision or hearing, new weakness, tingling, numbness in any extremity, No problems  swallowing food or Liquids, indigestion/reflux No Chest pain, Shortness of Breath,no awareness of palpitations, orthopnea  No Abdominal pain, N/V; no melena or hematochezia, no dark tarry stools, Bowel movements are regular, No dysuria, hematuria or flank pain No new skin rashes, lesions, masses or bruises, No new joints pains-aches No recent weight gain or loss No polyuria, polydypsia or polyphagia,  *A full 10 point Review of Systems was done, except as stated above, all other Review of Systems were negative.  Social History History  Substance Use Topics  . Smoking status: Former Smoker -- 1.00 packs/day for 50 years    Types: Cigarettes    Quit date: 04/19/2013  . Smokeless tobacco: Never Used  . Alcohol Use: No     Comment: quit    Resides at:  Lives with:  Ambulatory status:   Family History Family History  Problem Relation Age of Onset  . Diabetes Mother   . Heart disease Mother   . Diabetes Father   . Heart disease Father   . Heart disease Brother     s/p CABG at 35  . Colon cancer Neg Hx   . Prostate cancer Neg Hx   . Rheum arthritis Daughter   . Heart attack Mother   . Heart attack Father   . Heart attack Brother   . Stroke Neg Hx      Prior to Admission medications   Medication Sig Start Date End Date Taking? Authorizing Provider  albuterol (PROVENTIL HFA;VENTOLIN HFA) 108 (90 BASE) MCG/ACT inhaler Inhale 2 puffs into the lungs every 4 (four) hours as needed for wheezing or shortness of breath. 03/22/15   Collene Gobble, MD  aspirin 81 MG tablet Take 81 mg by mouth daily.    Historical Provider, MD  ciprofloxacin (CIPRO) 500 MG tablet Take 1 tablet (500 mg total) by mouth 2 (two) times daily. 04/25/15   Susanne Borders, NP  FLUoxetine (PROZAC) 20 MG capsule Take 1 capsule (20 mg total) by mouth daily. 04/15/15   Colon Branch, MD  furosemide (LASIX) 40 MG tablet Take 1.5 tablets (60 mg total) by mouth daily. 04/15/15   Colon Branch, MD  hyaluronate sodium  (RADIAPLEXRX) GEL Apply 1 application topically 2 (two) times daily.    Historical Provider, MD  levETIRAcetam (KEPPRA) 500 MG tablet Take 1 tablet (500 mg total) by mouth 2 (two)  times daily. 04/15/15   Colon Branch, MD  lidocaine-prilocaine (EMLA) cream Apply 1 application topically as needed. 03/28/15   Maryanna Shape, NP  metoprolol (LOPRESSOR) 50 MG tablet Take 1 tablet (50 mg total) by mouth 2 (two) times daily. Patient not taking: Reported on 04/28/2015 04/22/15   Liliane Shi, PA-C  Multiple Vitamin (MULTIVITAMIN WITH MINERALS) TABS tablet Take 1 tablet by mouth daily. 05/14/14   Lavon Paganini Angiulli, PA-C  pantoprazole (PROTONIX) 40 MG tablet Take 1 tablet (40 mg total) by mouth at bedtime. 04/15/15   Colon Branch, MD  potassium chloride SA (K-DUR,KLOR-CON) 20 MEQ tablet Take 1 tablet (20 mEq total) by mouth daily. 04/15/15   Colon Branch, MD  PRESCRIPTION MEDICATION Last dose 01/03/15. At the cancer center. Dr Julien Nordmann    Historical Provider, MD  simvastatin (ZOCOR) 40 MG tablet Take 1 tablet (40 mg total) by mouth every evening. 04/15/15   Colon Branch, MD  UNABLE TO [place] Per medical necessity patient requires wheelchair to all perform daily activities. Pt is able to self propel chair. Unable to use can or walker due to balance 04/26/15   Susanne Borders, NP    No Known Allergies  Physical Exam  Vitals  Blood pressure 94/64, pulse 115, temperature 98.1 F (36.7 C), temperature source Oral, resp. rate 12, height '5\' 8"'$  (1.727 m), weight 248 lb 11.2 oz (112.81 kg), SpO2 100 %.   General:  In no acute distress, appears healthy and well nourished  Psych:  Normal affect, Denies Suicidal or Homicidal ideations, Awake Alert, Oriented X 3. Speech and thought patterns are clear and appropriate, no apparent short term memory deficits  Neuro:   No focal neurological deficits, CN II through XII intact, Strength 5/5 all 4 extremities, Sensation intact all 4 extremities.  ENT:  Ears and  Eyes appear Normal, Conjunctivae clear, PER. Moist oral mucosa without erythema or exudates.  Neck:  Supple, No lymphadenopathy appreciated  Respiratory:  Symmetrical chest wall movement, Good air movement bilaterally, faint insp crackles but otherwise CTA. Room Air  Cardiac:  RRR, No Murmurs, no LE edema noted, no JVD, No carotid bruits, peripheral pulses palpable at 2+  Abdomen:  Positive bowel sounds, Soft, Non tender, Non distended,  No masses appreciated, no obvious hepatosplenomegaly  Skin:  No Cyanosis, Normal Skin Turgor, No Skin Rash or Bruise.  Extremities: Symmetrical without obvious trauma or injury,  no effusions.  Data Review  CBC  Recent Labs Lab 04/25/15 1219 04/28/15 1300  WBC 0.9* 8.0  HGB 9.4* 9.1*  HCT 27.8* 26.7*  PLT 55* 62*  MCV 95.1 92.1  MCH 32.2 31.4  MCHC 33.9 34.1  RDW 15.7* 15.4  LYMPHSABS 0.4*  --   MONOABS 0.2  --   EOSABS 0.0  --   BASOSABS 0.0  --     Chemistries   Recent Labs Lab 04/25/15 1219 04/28/15 1300  NA 139 139  K 3.8 3.8  CL  --  107  CO2 20* 21*  GLUCOSE 158* 124*  BUN 40.5* 22*  CREATININE 1.5* 1.36*  CALCIUM 8.4 9.1  AST 19  --   ALT 18  --   ALKPHOS 79  --   BILITOT 0.72  --     estimated creatinine clearance is 62.5 mL/min (by C-G formula based on Cr of 1.36).  No results for input(s): TSH, T4TOTAL, T3FREE, THYROIDAB in the last 72 hours.  Invalid input(s): FREET3  Coagulation profile No  results for input(s): INR, PROTIME in the last 168 hours.  No results for input(s): DDIMER in the last 72 hours.  Cardiac Enzymes No results for input(s): CKMB, TROPONINI, MYOGLOBIN in the last 168 hours.  Invalid input(s): CK  Invalid input(s): POCBNP  Urinalysis    Component Value Date/Time   COLORURINE YELLOW 06/16/2014 0825   APPEARANCEUR CLEAR 06/16/2014 0825   LABSPEC 1.010 01/03/2015 1259   LABSPEC 1.022 06/16/2014 0825   PHURINE 7.0 01/03/2015 1259   PHURINE 6.0 06/16/2014 0825   GLUCOSEU  Negative 01/03/2015 1259   GLUCOSEU NEGATIVE 06/16/2014 0825   HGBUR Negative 01/03/2015 1259   HGBUR NEGATIVE 06/16/2014 0825   BILIRUBINUR Negative 01/03/2015 1259   BILIRUBINUR NEGATIVE 06/16/2014 0825   KETONESUR Negative 01/03/2015 1259   KETONESUR NEGATIVE 06/16/2014 0825   PROTEINUR Negative 01/03/2015 1259   PROTEINUR NEGATIVE 06/16/2014 0825   UROBILINOGEN 2 01/03/2015 1259   UROBILINOGEN 1.0 06/16/2014 0825   NITRITE Negative 01/03/2015 1259   NITRITE NEGATIVE 06/16/2014 0825   LEUKOCYTESUR Negative 01/03/2015 1259   LEUKOCYTESUR NEGATIVE 06/16/2014 0825    Imaging results:   Dg Chest Portable 1 View  04/28/2015   CLINICAL DATA:  Dizziness.  EXAM: PORTABLE CHEST - 1 VIEW  COMPARISON:  November 29, 2014.  FINDINGS: Stable cardiomediastinal silhouette. Sternotomy wires are noted. Visualized portion of left lung appears clear. Left lung base is not included in field-of-view. Increased airspace and interstitial opacity is noted in right lung concerning for pneumonia or edema. No definite pleural effusion or pneumothorax is noted. Right internal jugular Port-A-Cath is again noted and unchanged in position. Bony thorax is intact.  IMPRESSION: New right lung opacity is noted concerning for edema or pneumonia. Follow-up radiographs are recommended.   Electronically Signed   By: Marijo Conception, M.D.   On: 04/28/2015 13:26     EKG: (Independently reviewed) aTRIAL FIB WITH rvr, rates 150s, qtc normal   Assessment & Plan  Principal Problem:   Atrial fibrillation with RVR -admit to SDU -consult Cards -Cont IV Cardizem -per Cards last OP note (5/20): issues with Coreg and hypotension so changed to Lopressor; if continued issues with hypotension plan was to change to digoxin -keep K >/= 4.0- prn replete  Active Problems:   Non-small cell carcinoma of lung, stage 3 -stable -add Mohamed as Optometrist    Antineoplastic chemotherapy induced pancytopenia -labs at baseline    HCAP  (healthcare-associated pneumonia) -suspect etiology to right side infiltrate: immunocompromised -no fever or elev WBC due to preadmit Cipro -begin HCAP anbx (Maxipime and Vanco) -ck sputum and blood csx -ck HIV, urine strep and legionella -ck influenza PCR -may be dry but will hold IVFs and focus on oral hydration while holding lasix    Chronic systolic congestive heart failure, NYHA class 3 -appears compensated: no edema and low BNP -hold Lasix given PNA picture -daily weight and strict I/O    COPD mixed type -compensated w/o wheeze    Hoarseness of voice -? Vocal cord injury post intubation -concern with right side PNA sx's could have silent aspiration so will ask for SLP eval    Dyslipidemia    Coronary atherosclerosis of native coronary artery    Hepatic cirrhosis    DVT Prophylaxis: SCDs  Family Communication:   Wife and daughter at bedside  Code Status:  FULL CODE  Condition:  stable  Discharge disposition:  Time spent in minutes : 60      Aleya Durnell L. ANP on 04/28/2015 at 2:55 PM  Between 7am to 7pm - Pager - [phone]  After 7pm go to [email or web address] - password [email or web address]  And look for the night coverage person covering me after hours  [facility]

## 2015-04-28 NOTE — Progress Notes (Signed)
ANTIBIOTIC CONSULT NOTE - INITIAL  Pharmacy Consult for vancomycin and cefepime Indication: HCAP  No Known Allergies  Patient Measurements: Height: '5\' 8"'$  (172.7 cm) Weight: 248 lb 11.2 oz (112.81 kg) IBW/kg (Calculated) : 68.4  Vital Signs: Temp: 98.1 F (36.7 C) (05/26 1243) Temp Source: Oral (05/26 1243) BP: 94/64 mmHg (05/26 1415) Pulse Rate: 115 (05/26 1415) Intake/Output from previous day:   Intake/Output from this shift:    Labs:  Recent Labs  04/28/15 1300  WBC 8.0  HGB 9.1*  PLT 62*  CREATININE 1.36*   Estimated Creatinine Clearance: 62.5 mL/min (by C-G formula based on Cr of 1.36). No results for input(s): VANCOTROUGH, VANCOPEAK, VANCORANDOM, GENTTROUGH, GENTPEAK, GENTRANDOM, TOBRATROUGH, TOBRAPEAK, TOBRARND, AMIKACINPEAK, AMIKACINTROU, AMIKACIN in the last 72 hours.   Microbiology: No results found for this or any previous visit (from the past 720 hour(s)).  Medical History: Past Medical History  Diagnosis Date  . Systolic heart failure   . 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 Dyslipidemia   . HTN (hypertension)   . Atrial fibrillation   . Tobacco abuse   . Alcohol abuse   . H/O hiatal hernia   . Myocardial infarction   . Dysrhythmia     atrial fib  . Stroke 5/15  . CHF (congestive heart failure)   . Shortness of breath     occ  . GERD (gastroesophageal reflux disease)   . S/P chemotherapy, time since 4-12 weeks   . Cancer     around nose.  New dx of lung cancer  . Non-small cell carcinoma of lung, stage 3 12/16/2014    Assessment: 69 yo M in  ED from the cancer center 2nd afib with RVR with CC of dizziness and abnormal ECG.  Pharmacy consulted to dose cefepime and vancomycin for HCAP.   On Cipro 500 mg po bid started 5/23.    Wt 112.8 kg, WBC 8, creat 1.36, AF.  5/25 CXR: new R lung opacity concerning for edema or PNA  Cefepime 5/26>> vanc 5/26>>  5/26 flu>> 5/26 BC x2>> 5/26 sputum>> 5/26 HIV>> 5/26  legionella 5/26 strep pneumo urinary antigen>>  Goal of Therapy:  Vancomycin trough level 15-20 mcg/ml  Plan:  -cefepime 2 gm IV q8h -vancomycin 2 gm IV load then vancomycin 750 mg IV q12h per obesity dosing nomogram -f/u renal fxn, wbc, temp, culture data, CXR, clinical course -drug levels as needed  Eudelia Bunch, Pharm.D. [phone] 04/28/2015 3:03 PM

## 2015-04-28 NOTE — ED Notes (Signed)
3W APPT @ 1630

## 2015-04-28 NOTE — ED Notes (Signed)
Pt sent here from doctors office for a-fib RVR. Pt c/o dizziness and sob. Denies chest discomfort.

## 2015-04-28 NOTE — Progress Notes (Signed)
1.) Reason for visit: BP/EKG -    2.) Name of MD requesting visit: [facility]/ [facility]  3.) H&P: Chronic A. Fib, SOB, and hypotensive   4.) ROS related to problem: Pt presents today for BP check as during visit to oncologist on Monday pt BP was 80/40 and Lopressor was stopped. Today it pt BP104/62 in left arm and HR of 161 per EKG machine and pt has chronic A. Fib. Showed EKG to DOD Turner, recommended that pt go to the Emergency Room for evaluation.  Pt and wife educated on recommendations by Dr. And importance of going to ED, they agreed. Pt s sent via private vehicle.  Report called to [place], RN in ED.

## 2015-04-28 NOTE — ED Notes (Signed)
Pt in bed without distress. Family at bedside. No needs.

## 2015-04-28 NOTE — Telephone Encounter (Signed)
Fax received, with confirmation POA Jerrilyn [place]. Forward to HIM to be scanned.

## 2015-04-28 NOTE — ED Notes (Signed)
Called pharmacy about Digoxin push

## 2015-04-28 NOTE — Progress Notes (Signed)
CRITICAL VALUE ALERT  Critical value received:  Lactic acid = 3.0  Date of notification:  04/28/15  Time of notification:  22:00  Critical value read back:Yes.    Nurse who received alert:  Lenise Herald, RN  MD notified (1st page):  Kathline Magic, NP on-call for [facility]  Time of first page:  22:27  MD notified (2nd page):  Time of second page:  Responding MD:  Kathline Magic, NP  Time MD responded:  22:31  Orders for normal saline 0.9% IV 211m bolus received. Patient asymptomatic with stable vital signs, alert, oriented x 4. Second IV access (diltiazem infusing) lost during bolus infusion; restarted per policy (see IV flowsheet).  Will follow with primary RN for patient needs.  -- C. RDeidre Ala RN, BSN

## 2015-04-29 ENCOUNTER — Inpatient Hospital Stay (HOSPITAL_COMMUNITY): Payer: Medicare Other

## 2015-04-29 ENCOUNTER — Encounter (HOSPITAL_COMMUNITY): Payer: Self-pay | Admitting: General Practice

## 2015-04-29 DIAGNOSIS — I4891 Unspecified atrial fibrillation: Secondary | ICD-10-CM

## 2015-04-29 DIAGNOSIS — I5022 Chronic systolic (congestive) heart failure: Secondary | ICD-10-CM

## 2015-04-29 DIAGNOSIS — J189 Pneumonia, unspecified organism: Secondary | ICD-10-CM

## 2015-04-29 LAB — CBC
HCT: 24.6 % — ABNORMAL LOW (ref 39.0–52.0)
Hemoglobin: 8.3 g/dL — ABNORMAL LOW (ref 13.0–17.0)
MCH: 31.6 pg (ref 26.0–34.0)
MCHC: 33.7 g/dL (ref 30.0–36.0)
MCV: 93.5 fL (ref 78.0–100.0)
Platelets: 60 10*3/uL — ABNORMAL LOW (ref 150–400)
RBC: 2.63 MIL/uL — ABNORMAL LOW (ref 4.22–5.81)
RDW: 15.7 % — AB (ref 11.5–15.5)
WBC: 5.6 10*3/uL (ref 4.0–10.5)

## 2015-04-29 LAB — BASIC METABOLIC PANEL
Anion gap: 5 (ref 5–15)
BUN: 19 mg/dL (ref 6–20)
CHLORIDE: 107 mmol/L (ref 101–111)
CO2: 23 mmol/L (ref 22–32)
CREATININE: 1.25 mg/dL — AB (ref 0.61–1.24)
Calcium: 8.4 mg/dL — ABNORMAL LOW (ref 8.9–10.3)
GFR calc non Af Amer: 57 mL/min — ABNORMAL LOW (ref >60)
Glucose, Bld: 102 mg/dL — ABNORMAL HIGH (ref 65–99)
POTASSIUM: 3.5 mmol/L (ref 3.5–5.1)
Sodium: 135 mmol/L (ref 135–145)

## 2015-04-29 LAB — INFLUENZA PANEL BY PCR (TYPE A & B)
H1N1FLUPCR: NOT DETECTED
INFLBPCR: NEGATIVE
Influenza A By PCR: NEGATIVE

## 2015-04-29 LAB — LACTIC ACID, PLASMA: LACTIC ACID, VENOUS: 1 mmol/L (ref 0.5–2.0)

## 2015-04-29 MED ORDER — FUROSEMIDE 10 MG/ML IJ SOLN
40.0000 mg | Freq: Once | INTRAMUSCULAR | Status: AC
Start: 2015-04-29 — End: 2015-04-29
  Administered 2015-04-29: 40 mg via INTRAVENOUS
  Filled 2015-04-29: qty 4

## 2015-04-29 MED ORDER — DIGOXIN 125 MCG PO TABS
0.1250 mg | ORAL_TABLET | Freq: Every day | ORAL | Status: DC
  Administered 2015-04-29 – 2015-05-02 (×4): 0.125 mg via ORAL
  Filled 2015-04-29 (×4): qty 1

## 2015-04-29 MED ORDER — METOPROLOL TARTRATE 25 MG PO TABS
25.0000 mg | ORAL_TABLET | Freq: Two times a day (BID) | ORAL | Status: DC
  Administered 2015-04-29 – 2015-05-02 (×7): 25 mg via ORAL
  Filled 2015-04-29 (×5): qty 1
  Filled 2015-04-29: qty 2
  Filled 2015-04-29: qty 1
  Filled 2015-04-29: qty 2

## 2015-04-29 NOTE — Procedures (Signed)
Objective Swallowing Evaluation: Other (Comment) (FEES)  Patient Details  Name: ASHDEN SONNENBERG MRN: [id number] Date of Birth: 1946-05-14  Today's Date: 04/29/2015 Time: SLP Start Time (ACUTE ONLY): 1355-SLP Stop Time (ACUTE ONLY): 1414 SLP Time Calculation (min) (ACUTE ONLY): 19 min  Past Medical History:  Past Medical History  Diagnosis Date  . Systolic heart failure   . 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 Dyslipidemia   . HTN (hypertension)   . Atrial fibrillation   . Tobacco abuse   . Alcohol abuse   . H/O hiatal hernia   . Myocardial infarction   . Dysrhythmia     atrial fib  . Stroke 5/15  . CHF (congestive heart failure)   . Shortness of breath     occ  . GERD (gastroesophageal reflux disease)   . S/P chemotherapy, time since 4-12 weeks   . Cancer     around nose.  New dx of lung cancer  . Non-small cell carcinoma of lung, stage 3 12/16/2014   Past Surgical History:  Past Surgical History  Procedure Laterality Date  . Umbilical hernia repair  2010  . Hernia repair  2012  . Ventral hernia repair N/A 01/22/2013    Procedure: HERNIA REPAIR VENTRAL ADULT;  Surgeon: [name], MD;  Location: [facility];  Service: General;  Laterality: N/A;  . Application of a-cell of chest/abdomen N/A 01/22/2013    Procedure: APPLICATION OF A-CELL OF CHEST/ABDOMEN;  Surgeon: [name], MD;  Location: [facility];  Service: General;  Laterality: N/A;  . Cystoscopy N/A 01/22/2013    Procedure: Consuela Mimes;  Surgeon: [name], MD;  Location: [facility];  Service: Urology;  Laterality: N/A;  Cystoscopy with balloon dilation. Insertion of coude catheter.  . Craniotomy Right 04/19/2014    Procedure: CRANIOTOMY HEMATOMA EVACUATION SUBDURAL;  Surgeon: [name], MD;  Location: [facility];  Service: Neurosurgery;  Laterality: Right;  right  . Craniotomy Right 04/23/2014    Procedure: Craniotomy for Intracerebral Hemorrhage;  Surgeon: [name], MD;  Location: [facility];  Service: Neurosurgery;  Laterality: Right;  . 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 Coronary artery bypass graft  12/24/2008    4 vessel  . Coronary artery bypass graft  2012?  Marland Kitchen Craniotomy N/A 09/13/2014    Procedure: CRANIOTOMY BONE FLAP/PROSTHETIC PLATE;  Surgeon: [name], MD;  Location: [facility];  Service: Neurosurgery;  Laterality: N/A;  . Video bronchoscopy with endobronchial ultrasound N/A 11/09/2014    Procedure: VIDEO BRONCHOSCOPY WITH ENDOBRONCHIAL ULTRASOUND;  Surgeon: Collene Gobble, MD;  Location: [facility];  Service: Thoracic;  Laterality: N/A;   HPI:  Other Pertinent Information: 69 year old male with w/ PMH: HTN, traumatic SDH post craniotomy, stage III lung cancer, chemotherapy, followed by Dr. Earlie Server, presents to ER from [facility] secondary to A. fib with RVR, beta blockers stopped before 2 days as an outpatient given low blood pressure. Pt found to have Healthcare acquired pneumonia : Right lung opacity, pneumonia versus volume overload per MD. Pt reports chronic cough since ETT for craniotomy and assoc with chronic hoarseness. Pt was seen by SLP in May and June 2015 for TBI, found to have mild evidence of aspiration, no objective testing completed.   No Data Recorded  Assessment / Plan / Recommendation CHL IP CLINICAL IMPRESSIONS 04/29/2015  Therapy Diagnosis (None)  Clinical Impression Pt demonstrates normal swallow function. No penetration or aspiration observed, pt may continue a regular diet and thin liquids.Regarding hoarse vocal quality: pts laryngeal tissue and vocal folds appeared healthy. Larynx was slightly asymmetrical with right arytenoid adducting more and at a more narrow angle than the left, though mobility of the left was still apparent. Laryngeal closure was good. Suspect vibration of vocal  folds abnormal during phonation, only visible under video stroboscopy. Recommend pt f/u with ENT. No acute SLP f/u needed.       CHL IP TREATMENT RECOMMENDATION 04/29/2015  Treatment Recommendations No treatment recommended at this time     CHL IP DIET RECOMMENDATION 04/29/2015  SLP Diet Recommendations (No Data)  Liquid Administration via (None)  Medication Administration Whole meds with liquid  Compensations (None)  Postural Changes and/or Swallow Maneuvers (None)     CHL IP OTHER RECOMMENDATIONS 04/29/2015  Recommended Consults (None)  Oral Care Recommendations Oral care BID  Other Recommendations (None)     CHL IP FOLLOW UP RECOMMENDATIONS 05/04/2014  Follow up Recommendations Inpatient Rehab     CHL IP FREQUENCY AND DURATION 04/30/2014  Speech Therapy Frequency (ACUTE ONLY) min 2x/week  Treatment Duration (None)     Pertinent Vitals/Pain NA    SLP Swallow Goals No flowsheet data found.  No flowsheet data found.    CHL IP REASON FOR REFERRAL 04/29/2015  Reason for Referral Objectively evaluate swallowing function     CHL IP ORAL PHASE 04/29/2015  Lips (None)  Tongue (None)  Mucous membranes (None)  Nutritional status (None)  Other (None)  Oxygen therapy (None)  Oral Phase WFL  Oral - Pudding Teaspoon (None)  Oral - Pudding Cup (None)  Oral - Honey Teaspoon (None)  Oral - Honey Cup (None)  Oral - Honey Syringe (None)  Oral - Nectar Teaspoon (None)  Oral - Nectar Cup (None)  Oral - Nectar Straw (None)  Oral - Nectar Syringe (None)  Oral - Ice Chips (None)  Oral - Thin Teaspoon (None)  Oral - Thin Cup (None)  Oral - Thin Straw (None)  Oral - Thin Syringe (None)  Oral - Puree (None)  Oral - Mechanical Soft (None)  Oral - Regular (None)  Oral - Multi-consistency (None)  Oral - Pill (None)  Oral Phase - Comment (None)      CHL IP PHARYNGEAL PHASE 04/29/2015  Pharyngeal Phase WFL  Pharyngeal - Pudding Teaspoon (None)  Penetration/Aspiration details (pudding  teaspoon) (None)  Pharyngeal - Pudding Cup (None)  Penetration/Aspiration details (pudding cup) (None)  Pharyngeal - Honey Teaspoon (None)  Penetration/Aspiration details (honey teaspoon) (None)  Pharyngeal - Honey Cup (None)  Penetration/Aspiration details (honey cup) (None)  Pharyngeal - Honey Syringe (None)  Penetration/Aspiration details (honey syringe) (None)  Pharyngeal - Nectar Teaspoon (None)  Penetration/Aspiration details (nectar teaspoon) (None)  Pharyngeal - Nectar Cup (None)  Penetration/Aspiration details (nectar cup) (None)  Pharyngeal - Nectar Straw (None)  Penetration/Aspiration details (nectar straw) (None)  Pharyngeal - Nectar Syringe (None)  Penetration/Aspiration details (nectar syringe) (None)  Pharyngeal - Ice Chips (None)  Penetration/Aspiration details (ice chips) (None)  Pharyngeal - Thin Teaspoon (None)  Penetration/Aspiration details (thin teaspoon) (None)  Pharyngeal - Thin Cup (None)  Penetration/Aspiration details (thin cup) (None)  Pharyngeal - Thin Straw (None)  Penetration/Aspiration details (thin straw) (None)  Pharyngeal - Thin Syringe (None)  Penetration/Aspiration details (thin syringe') (None)  Pharyngeal - Puree (None)  Penetration/Aspiration details (  puree) (None)  Pharyngeal - Mechanical Soft (None)  Penetration/Aspiration details (mechanical soft) (None)  Pharyngeal - Regular (None)  Penetration/Aspiration details (regular) (None)  Pharyngeal - Multi-consistency (None)  Penetration/Aspiration details (multi-consistency) (None)  Pharyngeal - Pill (None)  Penetration/Aspiration details (pill) (None)  Pharyngeal Comment (None)      CHL IP CERVICAL ESOPHAGEAL PHASE 04/29/2015  Cervical Esophageal Phase WFL  Pudding Teaspoon (None)  Pudding Cup (None)  Honey Teaspoon (None)  Honey Cup (None)  Honey Straw (None)  Nectar Teaspoon (None)  Nectar Cup (None)  Nectar Straw (None)  Nectar Sippy Cup (None)  Thin Teaspoon (None)  Thin  Cup (None)  Thin Straw (None)  Thin Sippy Cup (None)  Cervical Esophageal Comment (None)    No flowsheet data found.         Chalsey Leeth, Katherene Ponto 04/29/2015, 2:58 PM

## 2015-04-29 NOTE — Progress Notes (Signed)
Occupational Therapy Evaluation Patient Details Name: Alejandro [name] MRN: [id number] DOB: [birth year] Today's Date: 04/29/2015    History of Present Illness 69 yo male w/ PMH: HTN, traumatic SDH post craniotomy,Chronic SHF/EF 35%, AF not on anticoagulation 2/2 chemo induced pancytopenia, lung CA (nonsm cell stage III) who presented to ER from cancer center 2/2 AF/RVR.    Clinical Impression   PTA, pt mod I with mobility @ RW/cane level and ADL. Pt with decline in functional status and will benefit from acute OT services to maximize functional level of independence with ADL and mobility to facilitate safe d/C home with 24/7 S.    Follow Up Recommendations  No OT follow up;Supervision/Assistance - 24 hour    Equipment Recommendations  None recommended by OT    Recommendations for Other Services       Precautions / Restrictions Precautions Precautions: Fall      Mobility Bed Mobility Overal bed mobility: Needs Assistance Bed Mobility: Supine to Sit     Supine to sit: Supervision;HOB elevated        Transfers Overall transfer level: Needs assistance Equipment used: 1 person hand held assist Transfers: Sit to/from Stand;Stand Pivot Transfers Sit to Stand: Min assist Stand pivot transfers: Min assist       General transfer comment: steady assist    Balance Overall balance assessment: History of Falls (uses RW/cane at baseline)                                          ADL Overall ADL's : Needs assistance/impaired     Grooming: Set up   Upper Body Bathing: Set up   Lower Body Bathing: Minimal assistance;Sit to/from stand   Upper Body Dressing : Set up   Lower Body Dressing: Minimal assistance;Sit to/from stand   Toilet Transfer: Minimal assistance (simulated)   Toileting- Clothing Manipulation and Hygiene: Sit to/from stand;Min guard       Functional mobility during ADLs: Minimal assistance General ADL Comments: Pt requires increased  assistance with LB ADL. States wife can help if needed.      Vision Vision Assessment?: No apparent visual deficitswears reading glasses              Pertinent Vitals/Pain Pain Assessment: No/denies pain     Hand Dominance Right   Extremity/Trunk Assessment Upper Extremity Assessment Upper Extremity Assessment: Overall WFL for tasks assessed   Lower Extremity Assessment Lower Extremity Assessment: Defer to PT evaluation   Cervical / Trunk Assessment Cervical / Trunk Assessment: Normal   Communication Communication Communication: Other (comment) (horse voice)   Cognition Arousal/Alertness: Awake/alert Behavior During Therapy: WFL for tasks assessed/performed Overall Cognitive Status: No family/caregiver present to determine baseline cognitive functioning (most likely at baseline)                                        Home Living Family/patient expects to be discharged to:: Private residence Living Arrangements: Spouse/significant other Available Help at Discharge: Family;Available 24 hours/day Type of Home: House Home Access: Ramped entrance     Home Layout: One level     Bathroom Shower/Tub: Occupational psychologist: Standard Bathroom Accessibility: Yes How Accessible: Accessible via walker            Prior Functioning/Environment Level of Independence: Independent  with assistive device(s)        Comments: canor RW    OT Diagnosis: Generalized weakness   OT Problem List: Decreased strength;Decreased activity tolerance   OT Treatment/Interventions: Self-care/ADL training;Therapeutic exercise;Energy conservation;DME and/or AE instruction;Therapeutic activities;Balance training;Patient/family education    OT Goals(Current goals can be found in the care plan section) Acute Rehab OT Goals Patient Stated Goal: to go home and stay out of the hospital OT Goal Formulation: With patient Time For Goal Achievement:  05/13/15 Potential to Achieve Goals: Good  OT Frequency: Min 2X/week   Barriers to D/C:            Co-evaluation PT/OT/SLP Co-Evaluation/Treatment: Yes Reason for Co-Treatment: Complexity of the patient's impairments (multi-system involvement) (partial session)   OT goals addressed during session: ADL's and self-care;Other (comment) (mobility)      End of Session Nurse Communication: Mobility status  Activity Tolerance: Patient tolerated treatment well Patient left: in chair;with call bell/phone within reach   Time: 6720-9470 OT Time Calculation (min): 29 min Charges:  OT General Charges $OT Visit: 1 Procedure OT Evaluation $Initial OT Evaluation Tier I: 1 Procedure G-Codes:    Montray Kliebert,HILLARY 05/28/2015, 10:48 AM   Maurie Boettcher, OTR/L  [phone] May 28, 2015

## 2015-04-29 NOTE — Progress Notes (Signed)
OT Cancellation Note  Patient Details Name: FERRY MATTHIS MRN: [id number] DOB: Jan 23, 1946   Cancelled Treatment:    Reason Eval/Treat Not Completed: Other (comment) Activebedrest orders. Please up date activity orders to initiate therapy. [place], OTR/L  [phone] 04/29/2015 04/29/2015, 9:57 AM

## 2015-04-29 NOTE — Progress Notes (Signed)
IV site appears enlarged and pt reports painful to touch. IV fluids stopped and site removed. Pharmacy consulted for best practice and alternating ice/heat pack applied to patients arm and elevated on pillow. IV Team consulted for possible access to patients port on R chest versus another peripheral stick.

## 2015-04-29 NOTE — Progress Notes (Signed)
[facility] PROGRESS NOTE  Alejandro [name] [id number] DOB: Jun 28, 1946 DOA: 04/28/2015 PCP: [name], MD  Assessment/Plan: Atrial fibrillation with RVR -Cardiology following -started on metoprolol.  -was having problems with hypotension outpatient.  -Digoxin daily.  -HR better controlled today.    Non-small cell carcinoma of lung, stage 3 -stable -Follow with Dr. Julien Nordmann   Antineoplastic chemotherapy induced pancytopenia -labs at baseline   HCAP (healthcare-associated pneumonia) -suspect etiology to right side infiltrate: immunocompromised -continue with vancomycin and cefepime. If evidence of aspiration will need to change antibiotics.  -blood csx -HIV, urine strep and legionella pending.  - influenza PCR pending.     Chronic systolic congestive heart failure, NYHA class 3 -received one time dose of lasix today.  -daily weight and strict I/O -cardiology following.    COPD mixed type -compensated.  -Albuterol PRN.    Hoarseness of voice -speech recommending FEES to rule out aspiration PNA   Dyslipidemia   Coronary atherosclerosis of native coronary artery   Hepatic cirrhosis  Code Status: full code.  Family Communication: Care discussed with patient.  Disposition Plan: probably home at time of discharge. He will need few days of IV antibiotics.    Consultants:  Cardiology  Procedures:  none  Antibiotics:  Vancomycin 5-26  Cefepime 5-26  HPI/Subjective: Feeling better today, breathing better./    Objective: Filed Vitals:   04/29/15 0400  BP: 123/69  Pulse: 80  Temp: 98.1 F (36.7 C)  Resp: 24    Intake/Output Summary (Last 24 hours) at 04/29/15 0825 Last data filed at 04/29/15 0520  Gross per 24 hour  Intake    240 ml  Output    400 ml  Net   -160 ml   Filed Weights   04/28/15 1243 04/28/15 1659  Weight: 112.81 kg (248 lb 11.2 oz) 111.9 kg (246 lb 11.1 oz)    Exam:   General: Alert in no distress.    Cardiovascular: S 1, S 2 IRR  Respiratory: decrease breath sounds right, few crackles.   Abdomen: bs present, soft, nt  Musculoskeletal: no edema  Data Reviewed: Basic Metabolic Panel:  Recent Labs Lab 04/25/15 1219 04/28/15 1300 04/29/15 0534  NA 139 139 135  K 3.8 3.8 3.5  CL  --  107 107  CO2 20* 21* 23  GLUCOSE 158* 124* 102*  BUN 40.5* 22* 19  CREATININE 1.5* 1.36* 1.25*  CALCIUM 8.4 9.1 8.4*   Liver Function Tests:  Recent Labs Lab 04/25/15 1219  AST 19  ALT 18  ALKPHOS 79  BILITOT 0.72  PROT 6.3*  ALBUMIN 3.1*   No results for input(s): LIPASE, AMYLASE in the last 168 hours. No results for input(s): AMMONIA in the last 168 hours. CBC:  Recent Labs Lab 04/25/15 1219 04/28/15 1300 04/29/15 0534  WBC 0.9* 8.0 5.6  NEUTROABS 0.4*  --   --   HGB 9.4* 9.1* 8.3*  HCT 27.8* 26.7* 24.6*  MCV 95.1 92.1 93.5  PLT 55* 62* 60*   Cardiac Enzymes: No results for input(s): CKTOTAL, CKMB, CKMBINDEX, TROPONINI in the last 168 hours. BNP (last 3 results)  Recent Labs  04/28/15 1300  BNP 293.9*    ProBNP (last 3 results)  Recent Labs  03/29/15 1133 04/05/15 1134  PROBNP 543.0* 252.0*    CBG: No results for input(s): GLUCAP in the last 168 hours.  Recent Results (from the past 240 hour(s))  MRSA PCR Screening     Status: None   Collection Time: 04/28/15  5:38  PM  Result Value Ref Range Status   MRSA by PCR NEGATIVE NEGATIVE Final    Comment:        The GeneXpert MRSA Assay (FDA approved for NASAL specimens only), is one component of a comprehensive MRSA colonization surveillance program. It is not intended to diagnose MRSA infection nor to guide or monitor treatment for MRSA infections.      Studies: Dg Chest Port 1 View  04/29/2015   CLINICAL DATA:  Shortness of breath.  EXAM: PORTABLE CHEST - 1 VIEW  COMPARISON:  04/28/2015.  FINDINGS: Power port in stable position. Prior CABG. Stable cardiomegaly. Progressive diffuse right lung  infiltrate. Mild left base infiltrate. No pneumothorax.  IMPRESSION: 1. Power port catheter stable position. 2. Persistent diffuse right lung infiltrate. Mild left base infiltrate. 3. Prior CABG.  Stable cardiomegaly.   Electronically Signed   By: Lena   On: 04/29/2015 07:52   Dg Chest Portable 1 View  04/28/2015   CLINICAL DATA:  Dizziness.  EXAM: PORTABLE CHEST - 1 VIEW  COMPARISON:  November 29, 2014.  FINDINGS: Stable cardiomediastinal silhouette. Sternotomy wires are noted. Visualized portion of left lung appears clear. Left lung base is not included in field-of-view. Increased airspace and interstitial opacity is noted in right lung concerning for pneumonia or edema. No definite pleural effusion or pneumothorax is noted. Right internal jugular Port-A-Cath is again noted and unchanged in position. Bony thorax is intact.  IMPRESSION: New right lung opacity is noted concerning for edema or pneumonia. Follow-up radiographs are recommended.   Electronically Signed   By: Marijo Conception, M.D.   On: 04/28/2015 13:26    Scheduled Meds: . aspirin EC  81 mg Oral Daily  . ceFEPime (MAXIPIME) IV  2 g Intravenous Q8H  . FLUoxetine  20 mg Oral Daily  . levETIRAcetam  500 mg Oral BID  . multivitamin with minerals  1 tablet Oral Daily  . pantoprazole  40 mg Oral QHS  . potassium chloride SA  20 mEq Oral Daily  . simvastatin  40 mg Oral q1800  . vancomycin  750 mg Intravenous Q12H   Continuous Infusions: . sodium chloride 10 mL/hr at 04/28/15 1900  . diltiazem (CARDIZEM) infusion 5 mg/hr (04/28/15 1414)    Principal Problem:   Atrial fibrillation with RVR Active Problems:   Dyslipidemia   Coronary atherosclerosis of native coronary artery   Chronic systolic congestive heart failure, NYHA class 3   COPD mixed type   Hepatic cirrhosis   Non-small cell carcinoma of lung, stage 3   Antineoplastic chemotherapy induced pancytopenia   Hoarseness of voice   HCAP (healthcare-associated  pneumonia)    Time spent: 35 minutes.     Niel Hummer A  [facility] Pager [phone]. If 7PM-7AM, please contact night-coverage at [email or web address], password [facility] 04/29/2015, 8:25 AM  LOS: 1 day

## 2015-04-29 NOTE — Evaluation (Signed)
Physical Therapy Evaluation Patient Details Name: Alejandro [name] MRN: [id number] DOB: [birth year] Today's Date: 04/29/2015   History of Present Illness  69 yo male admitted with A fib with RVR. Hx of craniotomy 04/2014, lung cancer, A fib, CHF, ETOH abuse, MI, CVA.   Clinical Impression  On eval, pt was Min assist for mobility-able to perform stand pivot from bed to recliner. HR up to 100 bpm and O2 sats at 91% on RA at the lowest during session. MD recommend light activity on today so limited session to transfer only. Hopefully we will be able to progress activity as pt improves. At this time, recommend HHPT and intermittent supervision, depending on progress.      Follow Up Recommendations Home health PT;Supervision - Intermittent    Equipment Recommendations  None recommended by PT    Recommendations for Other Services OT consult     Precautions / Restrictions Precautions Precautions: Fall Precaution Comments: monitor vitals Restrictions Weight Bearing Restrictions: No      Mobility  Bed Mobility Overal bed mobility: Needs Assistance Bed Mobility: Supine to Sit     Supine to sit: Supervision;HOB elevated     General bed mobility comments: supervision for safety  Transfers Overall transfer level: Needs assistance Equipment used: 1 person hand held assist Transfers: Sit to/from Stand Sit to Stand: Min assist Stand pivot transfers: Min assist       General transfer comment: Assist to rise, stabilize, control descent. Increased time. Stand pivot from bed to recliner.   Ambulation/Gait             General Gait Details: NT on today-MD recommended light activity so performed transfer only  Stairs            Wheelchair Mobility    Modified Rankin (Stroke Patients Only)       Balance Overall balance assessment: History of Falls (uses RW/cane at baseline)                                           Pertinent Vitals/Pain Pain  Assessment: No/denies pain    Home Living Family/patient expects to be discharged to:: Private residence Living Arrangements: Spouse/significant other Available Help at Discharge: Family;Available 24 hours/day Type of Home: House Home Access: Ramped entrance     Home Layout: One level        Prior Function Level of Independence: Independent with assistive device(s)         Comments: cane or RW     Hand Dominance   Dominant Hand: Right    Extremity/Trunk Assessment   Upper Extremity Assessment: Defer to OT evaluation           Lower Extremity Assessment: Generalized weakness      Cervical / Trunk Assessment: Normal  Communication   Communication: Other (comment) (horse voice)  Cognition Arousal/Alertness: Awake/alert Behavior During Therapy: WFL for tasks assessed/performed Overall Cognitive Status: No family/caregiver present to determine baseline cognitive functioning                      General Comments      Exercises        Assessment/Plan    PT Assessment Patient needs continued PT services  PT Diagnosis Difficulty walking;Generalized weakness   PT Problem List Decreased strength;Decreased activity tolerance;Decreased mobility;Cardiopulmonary status limiting activity  PT Treatment Interventions DME instruction;Gait training;Functional mobility training;Therapeutic activities;Therapeutic  exercise;Patient/family education   PT Goals (Current goals can be found in the Care Plan section) Acute Rehab PT Goals Patient Stated Goal: to go home and stay out of the hospital PT Goal Formulation: With patient Time For Goal Achievement: 05/13/15 Potential to Achieve Goals: Good    Frequency Min 3X/week   Barriers to discharge        Co-evaluation   Reason for Co-Treatment: Complexity of the patient's impairments (multi-system involvement) (partial session)   OT goals addressed during session: ADL's and self-care;Other (comment)  (mobility)       End of Session   Activity Tolerance: Patient tolerated treatment well Patient left: in chair;with call bell/phone within reach           Time: 1000-1015 PT Time Calculation (min) (ACUTE ONLY): 15 min   Charges:   PT Evaluation $Initial PT Evaluation Tier I: 1 Procedure     PT G Codes:        Weston Anna, MPT Pager: [phone]

## 2015-04-29 NOTE — Evaluation (Signed)
Clinical/Bedside Swallow Evaluation Patient Details  Name: Alejandro [name] MRN: [id number] Date of Birth: [birth year]  Today's Date: 04/29/2015 Time: SLP Start Time (ACUTE ONLY): 1106 SLP Stop Time (ACUTE ONLY): 1114 SLP Time Calculation (min) (ACUTE ONLY): 8 min  Past Medical History:  Past Medical History  Diagnosis Date  . Systolic heart failure   . 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 Dyslipidemia   . HTN (hypertension)   . Atrial fibrillation   . Tobacco abuse   . Alcohol abuse   . H/O hiatal hernia   . Myocardial infarction   . Dysrhythmia     atrial fib  . Stroke 5/15  . CHF (congestive heart failure)   . Shortness of breath     occ  . GERD (gastroesophageal reflux disease)   . S/P chemotherapy, time since 4-12 weeks   . Cancer     around nose.  New dx of lung cancer  . Non-small cell carcinoma of lung, stage 3 12/16/2014   Past Surgical History:  Past Surgical History  Procedure Laterality Date  . Umbilical hernia repair  2010  . Hernia repair  2012  . Ventral hernia repair N/A 01/22/2013    Procedure: HERNIA REPAIR VENTRAL ADULT;  Surgeon: [name], MD;  Location: [place];  Service: General;  Laterality: N/A;  . Application of a-cell of chest/abdomen N/A 01/22/2013    Procedure: APPLICATION OF A-CELL OF CHEST/ABDOMEN;  Surgeon: [name], MD;  Location: [place];  Service: General;  Laterality: N/A;  . Cystoscopy N/A 01/22/2013    Procedure: Consuela Mimes;  Surgeon: [name], MD;  Location: [place];  Service: Urology;  Laterality: N/A;  Cystoscopy with balloon dilation. Insertion of coude catheter.  . Craniotomy Right 04/19/2014    Procedure: CRANIOTOMY HEMATOMA EVACUATION SUBDURAL;  Surgeon: [name], MD;  Location: [place] [facility];  Service: Neurosurgery;  Laterality: Right;  right  . Craniotomy Right 04/23/2014    Procedure: Craniotomy for Intracerebral Hemorrhage;  Surgeon: [name], MD;  Location: [place] [facility];  Service:  Neurosurgery;  Laterality: Right;  . 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 Coronary artery bypass graft  12/24/2008    4 vessel  . Coronary artery bypass graft  2012?  Marland Kitchen Craniotomy N/A 09/13/2014    Procedure: CRANIOTOMY BONE FLAP/PROSTHETIC PLATE;  Surgeon: [name], MD;  Location: [place] [facility];  Service: Neurosurgery;  Laterality: N/A;  . Video bronchoscopy with endobronchial ultrasound N/A 11/09/2014    Procedure: VIDEO BRONCHOSCOPY WITH ENDOBRONCHIAL ULTRASOUND;  Surgeon: Collene Gobble, MD;  Location: [facility];  Service: Thoracic;  Laterality: N/A;   HPI:  69 year old male with w/ PMH: HTN, traumatic SDH post craniotomy, stage III lung cancer, chemotherapy, followed by Dr. Earlie Server, presents to ER from [place] secondary to A. fib with RVR, beta blockers stopped before 2 days as an outpatient given low blood pressure. Pt found to have Healthcare acquired pneumonia : Right lung opacity, pneumonia versus volume overload per MD. Pt reports chronic cough since ETT for craniotomy and assoc with chronic hoarseness. Pt was seen by SLP in May and June 2015 for TBI, found to have mild evidence of aspiration, no objective testing completed.    Assessment / Plan / Recommendation Clinical Impression  Pt  demonstrates concern for silent aspiration given finding of right sided pna in setting of persistent dysphonia. Recommend pt continue current diet, but will f/u today with FEES for objective eval of swallowing.     Aspiration Risk  Moderate    Diet Recommendation  (regular/thin)   Medication Administration: Whole meds with liquid    Other  Recommendations Oral Care Recommendations: Oral care BID   Follow Up Recommendations       Frequency and Duration        Pertinent Vitals/Pain NA    SLP Swallow Goals     Swallow Study Prior  Functional Status  Type of Home: House Available Help at Discharge: Family;Available 24 hours/day    General Other Pertinent Information: 69 year old male with w/ PMH: HTN, traumatic SDH post craniotomy, stage III lung cancer, chemotherapy, followed by Dr. Earlie Server, presents to ER from [place] secondary to A. fib with RVR, beta blockers stopped before 2 days as an outpatient given low blood pressure. Pt found to have Healthcare acquired pneumonia : Right lung opacity, pneumonia versus volume overload per MD. Pt reports chronic cough since ETT for craniotomy and assoc with chronic hoarseness. Pt was seen by SLP in May and June 2015 for TBI, found to have mild evidence of aspiration, no objective testing completed.  Type of Study: Bedside swallow evaluation Previous Swallow Assessment: BSE 2015 - Dys 3/thin Diet Prior to this Study: Regular;Thin liquids Temperature Spikes Noted: No Respiratory Status: Room air History of Recent Intubation: No Behavior/Cognition: Alert;Cooperative;Pleasant mood Oral Cavity - Dentition: Adequate natural dentition/normal for age Self-Feeding Abilities: Able to feed self Patient Positioning: Upright in bed Baseline Vocal Quality: Hoarse Volitional Cough: Strong Volitional Swallow: Able to elicit    Oral/Motor/Sensory Function Overall Oral Motor/Sensory Function: Appears within functional limits for tasks assessed   Ice Chips     Thin Liquid Thin Liquid: Within functional limits    Nectar Thick Nectar Thick Liquid: Not tested   Honey Thick Honey Thick Liquid: Not tested   Puree Puree: Not tested   Solid   GO    Solid: Not tested      Herbie Baltimore, MA CCC-SLP [phone]  Lily Kernen, Katherene Ponto 04/29/2015,11:26 AM

## 2015-04-29 NOTE — Telephone Encounter (Signed)
The pt was admitted to the hospital on 04/28/15 due to AFib with RVR.

## 2015-04-29 NOTE — Progress Notes (Signed)
UR Completed Abcde Oneil Graves-Bigelow, RN,BSN [phone]  

## 2015-04-29 NOTE — Progress Notes (Addendum)
Patient Name: Alejandro [name] Date of Encounter: 04/29/2015  Primary Cardiologist Dr [name]   Principal Problem:   Atrial fibrillation with RVR Active Problems:   Dyslipidemia   Coronary atherosclerosis of native coronary artery   Chronic systolic congestive heart failure, NYHA class 3   COPD mixed type   Hepatic cirrhosis   Non-small cell carcinoma of lung, stage 3   Antineoplastic chemotherapy induced pancytopenia   Hoarseness of voice   HCAP (healthcare-associated pneumonia)    SUBJECTIVE  Denies any CP, no significant SOB.   CURRENT MEDS . aspirin EC  81 mg Oral Daily  . ceFEPime (MAXIPIME) IV  2 g Intravenous Q8H  . FLUoxetine  20 mg Oral Daily  . levETIRAcetam  500 mg Oral BID  . multivitamin with minerals  1 tablet Oral Daily  . pantoprazole  40 mg Oral QHS  . potassium chloride SA  20 mEq Oral Daily  . simvastatin  40 mg Oral q1800  . vancomycin  750 mg Intravenous Q12H    OBJECTIVE  Filed Vitals:   04/28/15 1659 04/28/15 2100 04/29/15 0016 04/29/15 0400  BP: 121/77 120/76 130/49 123/69  Pulse: 81 87 90 80  Temp: 98.2 F (36.8 C) 98.5 F (36.9 C) 98.5 F (36.9 C) 98.1 F (36.7 C)  TempSrc: Oral Oral Oral Oral  Resp: '20 23 23 24  '$ Height: '5\' 8"'$  (1.727 m)     Weight: 246 lb 11.1 oz (111.9 kg)     SpO2: 94% 99% 96% 99%    Intake/Output Summary (Last 24 hours) at 04/29/15 0747 Last data filed at 04/29/15 0520  Gross per 24 hour  Intake    240 ml  Output    400 ml  Net   -160 ml   Filed Weights   04/28/15 1243 04/28/15 1659  Weight: 248 lb 11.2 oz (112.81 kg) 246 lb 11.1 oz (111.9 kg)    PHYSICAL EXAM  General: Pleasant, NAD. Neuro: Alert and oriented X 3. Moves all extremities spontaneously. Psych: Normal affect. HEENT:  Normal  Neck: Supple without bruits or JVD. Lungs:  Resp regular and unlabored. Mildly decreased breath sound.  Heart: irregular. no s3, s4, or murmurs. Abdomen: Soft, non-tender, non-distended, BS + x 4.  Extremities:  No clubbing, cyanosis or edema. DP/PT/Radials 2+ and equal bilaterally.  Accessory Clinical Findings  CBC  Recent Labs  04/28/15 1300 04/29/15 0534  WBC 8.0 5.6  HGB 9.1* 8.3*  HCT 26.7* 24.6*  MCV 92.1 93.5  PLT 62* 60*   Basic Metabolic Panel  Recent Labs  04/28/15 1300 04/29/15 0534  NA 139 135  K 3.8 3.5  CL 107 107  CO2 21* 23  GLUCOSE 124* 102*  BUN 22* 19  CREATININE 1.36* 1.25*  CALCIUM 9.1 8.4*   Thyroid Function Tests  Recent Labs  04/28/15 2046  TSH 1.612    TELE A-fib with HR 70-80s    ECG  No new EKG  Echocardiogram 04/19/2015  LV EF: 30% -  35%  ------------------------------------------------------------------- Indications:   I94.85 Chronic Systolic Heart Failure.  ------------------------------------------------------------------- History:  PMH: Acquired from the patient and from the patient&'s chart. PMH: CAD. Atrial Fibrillation. MI. Stroke. Lung Cancer. Chronic Systolic Heart Failure. Risk factors: Current tobacco use. Hypertension. Dyslipidemia.  ------------------------------------------------------------------- Study Conclusions  - Left ventricle: The cavity size was normal. Wall thickness was normal. Systolic function was moderately to severely reduced. The estimated ejection fraction was in the range of 30% to 35%. - Aortic valve: There was  trivial regurgitation. - Mitral valve: There was mild regurgitation. - Left atrium: The atrium was severely dilated. - Right atrium: The atrium was moderately dilated.    Radiology/Studies  Dg Chest Portable 1 View  04/28/2015   CLINICAL DATA:  Dizziness.  EXAM: PORTABLE CHEST - 1 VIEW  COMPARISON:  November 29, 2014.  FINDINGS: Stable cardiomediastinal silhouette. Sternotomy wires are noted. Visualized portion of left lung appears clear. Left lung base is not included in field-of-view. Increased airspace and interstitial opacity is noted in right lung concerning for  pneumonia or edema. No definite pleural effusion or pneumothorax is noted. Right internal jugular Port-A-Cath is again noted and unchanged in position. Bony thorax is intact.  IMPRESSION: New right lung opacity is noted concerning for edema or pneumonia. Follow-up radiographs are recommended.   Electronically Signed   By: Marijo Conception, M.D.   On: 04/28/2015 13:26    ASSESSMENT AND PLAN  Alejandro [name] is a 69 y.o. year old male with a history of CABG, S-CHF, HTN, chronic Afib, HL, ETOH, and NSC Lung CA, stage 3. Traumatic SDH so no coumadin. Presented with a-fib with RVR  1. Atrial fibrillation with RVR  - CHA2DS2-Vasc score 3 (age, HTN, HF)  - continue ASA, cannot tolerate coumadin due to h/o SDH in the setting of trauma  - would not deliberately cardiovert him given risk of stroke without systemic anticoagulation support  - continue rate control, given LV dysfunction, will transition to metoprolol tartrate '25mg'$  BID. Start 0.'125mg'$  daily digoxin after loading yesterday. Likely when remain in hospital until pulm infiltrate resolve. Will uptitrate metoprolol based on BP response and HR  2. R PNA vs pulm edema  - currently treated for both PNA, difficult to assess JVD given neck fat, no LE edema, no rale on exam, however cannot r/o acute on chronic systolic HF in the setting a-fib  3. Possible acute on chronic systolic congestive heart failure, NYHA class 3  - on '40mg'$  PO lasix at home, will try '40mg'$  IV lasix x 1 and see response.  4. CAD s/p CABG x 4 12/2008  5. HTN  6. HLD   7. COPD mixed type  8. Hepatic cirrhosis  9. Non-small cell carcinoma of lung, stage 3 on chemotherapy  - no significant change in EF when compare to previous EKG  10. Antineoplastic chemotherapy induced pancytopenia  11. HCAP (healthcare-associated pneumonia)  Signed, Almyra Deforest PA-C Pager: [phone]   Patient seen and exained.  I agree with findings as noted above by Janan Ridge Patients heart rates are  improved.  [place] follow.  Continue ASA given hx SDH LAsix x1   Will continue to follow.  Dorris Carnes

## 2015-04-30 LAB — CBC
HEMATOCRIT: 26.8 % — AB (ref 39.0–52.0)
Hemoglobin: 9.2 g/dL — ABNORMAL LOW (ref 13.0–17.0)
MCH: 31.4 pg (ref 26.0–34.0)
MCHC: 34.3 g/dL (ref 30.0–36.0)
MCV: 91.5 fL (ref 78.0–100.0)
Platelets: 66 10*3/uL — ABNORMAL LOW (ref 150–400)
RBC: 2.93 MIL/uL — AB (ref 4.22–5.81)
RDW: 15.5 % (ref 11.5–15.5)
WBC: 6.2 10*3/uL (ref 4.0–10.5)

## 2015-04-30 LAB — BASIC METABOLIC PANEL
Anion gap: 8 (ref 5–15)
BUN: 21 mg/dL — ABNORMAL HIGH (ref 6–20)
CHLORIDE: 103 mmol/L (ref 101–111)
CO2: 24 mmol/L (ref 22–32)
CREATININE: 1.38 mg/dL — AB (ref 0.61–1.24)
Calcium: 8.6 mg/dL — ABNORMAL LOW (ref 8.9–10.3)
GFR calc Af Amer: 59 mL/min — ABNORMAL LOW (ref >60)
GFR calc non Af Amer: 51 mL/min — ABNORMAL LOW (ref >60)
GLUCOSE: 116 mg/dL — AB (ref 65–99)
Potassium: 3.6 mmol/L (ref 3.5–5.1)
Sodium: 135 mmol/L (ref 135–145)

## 2015-04-30 LAB — STREP PNEUMONIAE URINARY ANTIGEN: Strep Pneumo Urinary Antigen: NEGATIVE

## 2015-04-30 LAB — PROCALCITONIN: PROCALCITONIN: 0.25 ng/mL

## 2015-04-30 LAB — VANCOMYCIN, RANDOM: VANCOMYCIN RM: 17 ug/mL

## 2015-04-30 MED ORDER — SODIUM CHLORIDE 0.9 % IJ SOLN
10.0000 mL | INTRAMUSCULAR | Status: DC | PRN
  Administered 2015-05-02: 10 mL
  Filled 2015-04-30: qty 40

## 2015-04-30 MED ORDER — ALTEPLASE 2 MG IJ SOLR
2.0000 mg | Freq: Once | INTRAMUSCULAR | Status: DC
  Filled 2015-04-30: qty 2

## 2015-04-30 NOTE — Progress Notes (Signed)
Patient Name: Alejandro [name]      SUBJECTIVE: 69 year old gentleman with a history of systolic heart failure previous bypass surgery permanent atrial fibrillation non-small cell lung cancer receiving chemotherapy. Chest x-ray demonstrates a right pneumonia versus pulmonary edema  He was admitted 5/26 because of atrial fibrillation with a rapid rate and relative hypotension. The latter intinally mad it challenging to augment rate control.  It is hoped that the pneumonia being treated we'll decrease the stress   Echo EF 30-35%. Labs notable for anemia and thrombocytopenia.  Asleep    Past Medical History  Diagnosis Date  . Systolic heart failure   . 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 Dyslipidemia   . HTN (hypertension)   . Atrial fibrillation   . Tobacco abuse   . Alcohol abuse   . H/O hiatal hernia   . Myocardial infarction   . Dysrhythmia     atrial fib  . Stroke 5/15  . CHF (congestive heart failure)   . Shortness of breath     occ  . GERD (gastroesophageal reflux disease)   . S/P chemotherapy, time since 4-12 weeks   . Cancer     around nose.  New dx of lung cancer  . Non-small cell carcinoma of lung, stage 3 12/16/2014    Scheduled Meds:  Scheduled Meds: . alteplase  2 mg Intracatheter Once  . aspirin EC  81 mg Oral Daily  . ceFEPime (MAXIPIME) IV  2 g Intravenous Q8H  . digoxin  0.125 mg Oral Daily  . FLUoxetine  20 mg Oral Daily  . levETIRAcetam  500 mg Oral BID  . metoprolol tartrate  25 mg Oral BID  . multivitamin with minerals  1 tablet Oral Daily  . pantoprazole  40 mg Oral QHS  . potassium chloride SA  20 mEq Oral Daily  . simvastatin  40 mg Oral q1800  . vancomycin  750 mg Intravenous Q12H   Continuous Infusions: . sodium chloride 10 mL/hr at 04/29/15 2225   acetaminophen, albuterol, ondansetron (ZOFRAN) IV, sodium chloride    PHYSICAL EXAM Filed Vitals:   04/29/15 1202 04/29/15 1247 04/29/15 2040  04/30/15 0454  BP: 102/67 108/76 112/92 123/49  Pulse: 79 65 79 72  Temp:   97.7 F (36.5 C) 98.1 F (36.7 C)  TempSrc:   Oral Oral  Resp: '29 19 17 18  '$ Height:      Weight:    111.766 kg (246 lb 6.4 oz)  SpO2: 93% 98% 98% 98%   Somnolent and not aroused IRR Good air movement  TELEMETRY: Reviewed telemetry pt in * afib with controlled rate   Intake/Output Summary (Last 24 hours) at 04/30/15 1119 Last data filed at 04/30/15 0355  Gross per 24 hour  Intake    480 ml  Output    600 ml  Net   -120 ml    LABS: Basic Metabolic Panel:  Recent Labs Lab 04/25/15 1219  04/28/15 1300 04/29/15 0534 04/30/15 0546  NA 139  --  139 135 135  K 3.8  --  3.8 3.5 3.6  CL  --   --  107 107 103  CO2 20*  --  21* 23 24  GLUCOSE 158*  --  124* 102* 116*  BUN 40.5*  --  22* 19 21*  CREATININE 1.5*  --  1.36* 1.25* 1.38*  CALCIUM 8.4  < > 9.1 8.4* 8.6*  < > =  values in this interval not displayed. Cardiac Enzymes: No results for input(s): CKTOTAL, CKMB, CKMBINDEX, TROPONINI in the last 72 hours. CBC:  Recent Labs Lab 04/25/15 1219 04/28/15 1300 04/29/15 0534 04/30/15 0546  WBC 0.9* 8.0 5.6 6.2  NEUTROABS 0.4*  --   --   --   HGB 9.4* 9.1* 8.3* 9.2*  HCT 27.8* 26.7* 24.6* 26.8*  MCV 95.1 92.1 93.5 91.5  PLT 55* 62* 60* 66*   PROTIME: No results for input(s): LABPROT, INR in the last 72 hours. Liver Function Tests: No results for input(s): AST, ALT, ALKPHOS, BILITOT, PROT, ALBUMIN in the last 72 hours. No results for input(s): LIPASE, AMYLASE in the last 72 hours. BNP: BNP (last 3 results)  Recent Labs  04/28/15 1300  BNP 293.9*    ProBNP (last 3 results)  Recent Labs  03/29/15 1133 04/05/15 1134  PROBNP 543.0* 252.0*    D-Dimer: No results for input(s): DDIMER in the last 72 hours. Hemoglobin A1C: No results for input(s): HGBA1C in the last 72 hours. Fasting Lipid Panel: No results for input(s): CHOL, HDL, LDLCALC, TRIG, CHOLHDL, LDLDIRECT in the last 72  hours. Thyroid Function Tests:  Recent Labs  04/28/15 2046  TSH 1.612       ASSESSMENT AND PLAN:  Principal Problem:   Atrial fibrillation with RVR Active Problems:   Dyslipidemia   Coronary atherosclerosis of native coronary artery   Chronic systolic congestive heart failure, NYHA class 3   COPD mixed type   Hepatic cirrhosis   Non-small cell carcinoma of lung, stage 3   Antineoplastic chemotherapy induced pancytopenia   Hoarseness of voice   HCAP (healthcare-associated pneumonia)   HR well controlled Aspirin has been recommended, but I'm not sure that there is any significant benefit as relates to atrial fibrillation and in the context of thrombocytopenia unless there is another indication I would be inclined to stop it actually more   Signed, Virl Axe MD  04/30/2015

## 2015-04-30 NOTE — Progress Notes (Signed)
ANTIBIOTIC CONSULT NOTE - Follow Up  Pharmacy Consult for vancomycin and cefepime Indication: HCAP  No Known Allergies  Patient Measurements: Height: '5\' 8"'$  (172.7 cm) Weight: 246 lb 6.4 oz (111.766 kg) IBW/kg (Calculated) : 68.4  Vital Signs:   Intake/Output from previous day: 05/27 0701 - 05/28 0700 In: 720 [P.O.:720] Out: 800 [Urine:800] Intake/Output from this shift: Total I/O In: 480 [P.O.:480] Out: 950 [Urine:950]  Labs:  Recent Labs  04/28/15 1300 04/29/15 0534 04/30/15 0546  WBC 8.0 5.6 6.2  HGB 9.1* 8.3* 9.2*  PLT 62* 60* 66*  CREATININE 1.36* 1.25* 1.38*   Estimated Creatinine Clearance: 61.3 mL/min (by C-G formula based on Cr of 1.38).  Recent Labs  04/30/15 1530  VANCORANDOM 17     Microbiology: Recent Results (from the past 720 hour(s))  MRSA PCR Screening     Status: None   Collection Time: 04/28/15  5:38 PM  Result Value Ref Range Status   MRSA by PCR NEGATIVE NEGATIVE Final    Comment:        The GeneXpert MRSA Assay (FDA approved for NASAL specimens only), is one component of a comprehensive MRSA colonization surveillance program. It is not intended to diagnose MRSA infection nor to guide or monitor treatment for MRSA infections.   Culture, blood (routine x 2) Call MD if unable to obtain prior to antibiotics being given     Status: None (Preliminary result)   Collection Time: 04/28/15  8:46 PM  Result Value Ref Range Status   Specimen Description BLOOD RIGHT HAND  Final   Special Requests BOTTLES DRAWN AEROBIC ONLY 10CC  Final   Culture   Final           BLOOD CULTURE RECEIVED NO GROWTH TO DATE CULTURE WILL BE HELD FOR 5 DAYS BEFORE ISSUING A FINAL NEGATIVE REPORT Note: Culture results may be compromised due to an excessive volume of blood received in culture bottles. Performed at [facility]    Report Status PENDING  Incomplete  Culture, blood (routine x 2) Call MD if unable to obtain prior to antibiotics being given      Status: None (Preliminary result)   Collection Time: 04/28/15  9:00 PM  Result Value Ref Range Status   Specimen Description BLOOD RIGHT HAND  Final   Special Requests BOTTLES DRAWN AEROBIC AND ANAEROBIC 5CC  Final   Culture   Final           BLOOD CULTURE RECEIVED NO GROWTH TO DATE CULTURE WILL BE HELD FOR 5 DAYS BEFORE ISSUING A FINAL NEGATIVE REPORT Performed at [facility]    Report Status PENDING  Incomplete    Medical History: Past Medical History  Diagnosis Date  . Systolic heart failure   . 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 Dyslipidemia   . HTN (hypertension)   . Atrial fibrillation   . Tobacco abuse   . Alcohol abuse   . H/O hiatal hernia   . Myocardial infarction   . Dysrhythmia     atrial fib  . Stroke 5/15  . CHF (congestive heart failure)   . Shortness of breath     occ  . GERD (gastroesophageal reflux disease)   . S/P chemotherapy, time since 4-12 weeks   . Cancer     around nose.  New dx of lung cancer  . Non-small cell carcinoma of lung, stage 3 12/16/2014    Assessment: 69 yo M transferred to  [place] on 04/28/2015 from the cancer center 2/2 afib with RVR with CC of dizziness and abnormal ECG.  Pharmacy consulted to dose cefepime and vancomycin for HCAP.  On Cipro 500 mg po bid PTA started 5/23.  5/25 CXR: new R lung opacity concerning for edema or PNA Tmax 98.5, WBC wnl, SCr trend up to 1.38  Cefepime 5/26>> vanc 5/26>> Vancomycin trough level 17 at goal  5/26 flu>> 5/26 BC x2>>ngtd 5/26 sputum>> 5/26 HIV>> 5/26 legionella>> 5/26 strep pneumo urinary antigen>>NEG  Goal of Therapy:  Vancomycin trough level 15-20 mcg/ml  Plan:  - Cefepime 2 gm IV q8h - Vancomycin 750 mg IV q12h per obesity dosing nomogram - F/u renal fxn, wbc, temp, C&S, vanc levels as needed   Bonnita Nasuti Pharm.D. CPP, BCPS Clinical Pharmacist [phone] 04/30/2015 6:04 PM

## 2015-04-30 NOTE — Progress Notes (Signed)
[facility] PROGRESS NOTE  Alejandro [name] OIN:[id number] DOB: 08-23-1946 DOA: 04/28/2015 PCP: [name], MD  Assessment/Plan: Atrial fibrillation with RVR -Cardiology following -Rate controlled on metoprolol.  -was having problems with hypotension outpatient.  -Digoxin daily.  -will discontinue aspirin as recommended by Dr Caryl Comes.    Non-small cell carcinoma of lung, stage 3 -stable -Follow with Dr. Julien Nordmann   Antineoplastic chemotherapy induced pancytopenia -labs at baseline   HCAP (healthcare-associated pneumonia) -suspect etiology to right side infiltrate: immunocompromised -continue with vancomycin and cefepime over the weekend.  -blood csx no growth to date.  -HIV pending.  , urine strep negative  and legionella pending.  - influenza PCR negative.    Chronic systolic congestive heart failure, NYHA class 3 -received one time dose of lasix 5-28. -Daily weight and strict I/O -Cardiology following.  -Lasix per cardiology.    COPD mixed type -Compensated.  -Albuterol PRN.    Hoarseness of voice -no evidence of dysphagia on Fees. Needs follow up with ENT>    Dyslipidemia   Coronary atherosclerosis of native coronary artery   Hepatic cirrhosis  Code Status: full code.  Family Communication: Care discussed with patient.  Disposition Plan: probably home at time of discharge. He will need few days of IV antibiotics.    Consultants:  Cardiology  Procedures:  none  Antibiotics:  Vancomycin 5-26  Cefepime 5-26  HPI/Subjective: Feeling better today, breathing better./  No new complaints. Had BM  Objective: Filed Vitals:   04/30/15 0454  BP: 123/49  Pulse: 72  Temp: 98.1 F (36.7 C)  Resp: 18    Intake/Output Summary (Last 24 hours) at 04/30/15 1235 Last data filed at 04/30/15 1223  Gross per 24 hour  Intake    960 ml  Output    150 ml  Net    810 ml   Filed Weights   04/28/15 1243 04/28/15 1659 04/30/15 0454  Weight: 112.81 kg  (248 lb 11.2 oz) 111.9 kg (246 lb 11.1 oz) 111.766 kg (246 lb 6.4 oz)    Exam:   General: Alert in no distress.   Cardiovascular: S 1, S 2 IRR  Respiratory: decrease breath sounds right, few crackles.   Abdomen: bs present, soft, nt  Musculoskeletal: no edema  Data Reviewed: Basic Metabolic Panel:  Recent Labs Lab 04/25/15 1219 04/28/15 1300 04/29/15 0534 04/30/15 0546  NA 139 139 135 135  K 3.8 3.8 3.5 3.6  CL  --  107 107 103  CO2 20* 21* 23 24  GLUCOSE 158* 124* 102* 116*  BUN 40.5* 22* 19 21*  CREATININE 1.5* 1.36* 1.25* 1.38*  CALCIUM 8.4 9.1 8.4* 8.6*   Liver Function Tests:  Recent Labs Lab 04/25/15 1219  AST 19  ALT 18  ALKPHOS 79  BILITOT 0.72  PROT 6.3*  ALBUMIN 3.1*   No results for input(s): LIPASE, AMYLASE in the last 168 hours. No results for input(s): AMMONIA in the last 168 hours. CBC:  Recent Labs Lab 04/25/15 1219 04/28/15 1300 04/29/15 0534 04/30/15 0546  WBC 0.9* 8.0 5.6 6.2  NEUTROABS 0.4*  --   --   --   HGB 9.4* 9.1* 8.3* 9.2*  HCT 27.8* 26.7* 24.6* 26.8*  MCV 95.1 92.1 93.5 91.5  PLT 55* 62* 60* 66*   Cardiac Enzymes: No results for input(s): CKTOTAL, CKMB, CKMBINDEX, TROPONINI in the last 168 hours. BNP (last 3 results)  Recent Labs  04/28/15 1300  BNP 293.9*    ProBNP (last 3 results)  Recent Labs  03/29/15 1133 04/05/15 1134  PROBNP 543.0* 252.0*    CBG: No results for input(s): GLUCAP in the last 168 hours.  Recent Results (from the past 240 hour(s))  MRSA PCR Screening     Status: None   Collection Time: 04/28/15  5:38 PM  Result Value Ref Range Status   MRSA by PCR NEGATIVE NEGATIVE Final    Comment:        The GeneXpert MRSA Assay (FDA approved for NASAL specimens only), is one component of a comprehensive MRSA colonization surveillance program. It is not intended to diagnose MRSA infection nor to guide or monitor treatment for MRSA infections.   Culture, blood (routine x 2) Call MD if  unable to obtain prior to antibiotics being given     Status: None (Preliminary result)   Collection Time: 04/28/15  8:46 PM  Result Value Ref Range Status   Specimen Description BLOOD RIGHT HAND  Final   Special Requests BOTTLES DRAWN AEROBIC ONLY 10CC  Final   Culture   Final           BLOOD CULTURE RECEIVED NO GROWTH TO DATE CULTURE WILL BE HELD FOR 5 DAYS BEFORE ISSUING A FINAL NEGATIVE REPORT Note: Culture results may be compromised due to an excessive volume of blood received in culture bottles. Performed at [facility]    Report Status PENDING  Incomplete  Culture, blood (routine x 2) Call MD if unable to obtain prior to antibiotics being given     Status: None (Preliminary result)   Collection Time: 04/28/15  9:00 PM  Result Value Ref Range Status   Specimen Description BLOOD RIGHT HAND  Final   Special Requests BOTTLES DRAWN AEROBIC AND ANAEROBIC 5CC  Final   Culture   Final           BLOOD CULTURE RECEIVED NO GROWTH TO DATE CULTURE WILL BE HELD FOR 5 DAYS BEFORE ISSUING A FINAL NEGATIVE REPORT Performed at [facility]    Report Status PENDING  Incomplete     Studies: Dg Chest Port 1 View  04/29/2015   CLINICAL DATA:  Shortness of breath.  EXAM: PORTABLE CHEST - 1 VIEW  COMPARISON:  04/28/2015.  FINDINGS: Power port in stable position. Prior CABG. Stable cardiomegaly. Progressive diffuse right lung infiltrate. Mild left base infiltrate. No pneumothorax.  IMPRESSION: 1. Power port catheter stable position. 2. Persistent diffuse right lung infiltrate. Mild left base infiltrate. 3. Prior CABG.  Stable cardiomegaly.   Electronically Signed   By: [place]   On: 04/29/2015 07:52   Dg Chest Portable 1 View  04/28/2015   CLINICAL DATA:  Dizziness.  EXAM: PORTABLE CHEST - 1 VIEW  COMPARISON:  November 29, 2014.  FINDINGS: Stable cardiomediastinal silhouette. Sternotomy wires are noted. Visualized portion of left lung appears clear. Left lung base is not included  in field-of-view. Increased airspace and interstitial opacity is noted in right lung concerning for pneumonia or edema. No definite pleural effusion or pneumothorax is noted. Right internal jugular Port-A-Cath is again noted and unchanged in position. Bony thorax is intact.  IMPRESSION: New right lung opacity is noted concerning for edema or pneumonia. Follow-up radiographs are recommended.   Electronically Signed   By: Marijo Conception, M.D.   On: 04/28/2015 13:26    Scheduled Meds: . alteplase  2 mg Intracatheter Once  . aspirin EC  81 mg Oral Daily  . ceFEPime (MAXIPIME) IV  2 g Intravenous Q8H  . digoxin  0.125 mg  Oral Daily  . FLUoxetine  20 mg Oral Daily  . levETIRAcetam  500 mg Oral BID  . metoprolol tartrate  25 mg Oral BID  . multivitamin with minerals  1 tablet Oral Daily  . pantoprazole  40 mg Oral QHS  . potassium chloride SA  20 mEq Oral Daily  . simvastatin  40 mg Oral q1800  . vancomycin  750 mg Intravenous Q12H   Continuous Infusions: . sodium chloride 10 mL/hr at 04/29/15 2225    Principal Problem:   Atrial fibrillation with RVR Active Problems:   Dyslipidemia   Coronary atherosclerosis of native coronary artery   Chronic systolic congestive heart failure, NYHA class 3   COPD mixed type   Hepatic cirrhosis   Non-small cell carcinoma of lung, stage 3   Antineoplastic chemotherapy induced pancytopenia   Hoarseness of voice   HCAP (healthcare-associated pneumonia)    Time spent: 35 minutes.     Niel Hummer A  [facility] Pager [phone]. If 7PM-7AM, please contact night-coverage at [email or web address], password [email or web address] 04/30/2015, 12:35 PM  LOS: 2 days

## 2015-04-30 NOTE — Progress Notes (Signed)
ANTIBIOTIC CONSULT NOTE - INITIAL  Pharmacy Consult for vancomycin and cefepime Indication: HCAP  No Known Allergies  Patient Measurements: Height: '5\' 8"'$  (172.7 cm) Weight: 246 lb 6.4 oz (111.766 kg) IBW/kg (Calculated) : 68.4  Vital Signs: Temp: 98.1 F (36.7 C) (05/28 0454) Temp Source: Oral (05/28 0454) BP: 123/49 mmHg (05/28 0454) Pulse Rate: 72 (05/28 0454) Intake/Output from previous day: 05/27 0701 - 05/28 0700 In: 720 [P.O.:720] Out: 800 [Urine:800] Intake/Output from this shift: Total I/O In: 480 [P.O.:480] Out: -   Labs:  Recent Labs  04/28/15 1300 04/29/15 0534 04/30/15 0546  WBC 8.0 5.6 6.2  HGB 9.1* 8.3* 9.2*  PLT 62* 60* 66*  CREATININE 1.36* 1.25* 1.38*   Estimated Creatinine Clearance: 61.3 mL/min (by C-G formula based on Cr of 1.38). No results for input(s): VANCOTROUGH, VANCOPEAK, VANCORANDOM, GENTTROUGH, GENTPEAK, GENTRANDOM, TOBRATROUGH, TOBRAPEAK, TOBRARND, AMIKACINPEAK, AMIKACINTROU, AMIKACIN in the last 72 hours.   Microbiology: Recent Results (from the past 720 hour(s))  MRSA PCR Screening     Status: None   Collection Time: 04/28/15  5:38 PM  Result Value Ref Range Status   MRSA by PCR NEGATIVE NEGATIVE Final    Comment:        The GeneXpert MRSA Assay (FDA approved for NASAL specimens only), is one component of a comprehensive MRSA colonization surveillance program. It is not intended to diagnose MRSA infection nor to guide or monitor treatment for MRSA infections.   Culture, blood (routine x 2) Call MD if unable to obtain prior to antibiotics being given     Status: None (Preliminary result)   Collection Time: 04/28/15  8:46 PM  Result Value Ref Range Status   Specimen Description BLOOD RIGHT HAND  Final   Special Requests BOTTLES DRAWN AEROBIC ONLY 10CC  Final   Culture   Final           BLOOD CULTURE RECEIVED NO GROWTH TO DATE CULTURE WILL BE HELD FOR 5 DAYS BEFORE ISSUING A FINAL NEGATIVE REPORT Note: Culture results may  be compromised due to an excessive volume of blood received in culture bottles. Performed at [facility]    Report Status PENDING  Incomplete  Culture, blood (routine x 2) Call MD if unable to obtain prior to antibiotics being given     Status: None (Preliminary result)   Collection Time: 04/28/15  9:00 PM  Result Value Ref Range Status   Specimen Description BLOOD RIGHT HAND  Final   Special Requests BOTTLES DRAWN AEROBIC AND ANAEROBIC 5CC  Final   Culture   Final           BLOOD CULTURE RECEIVED NO GROWTH TO DATE CULTURE WILL BE HELD FOR 5 DAYS BEFORE ISSUING A FINAL NEGATIVE REPORT Performed at [facility]    Report Status PENDING  Incomplete    Medical History: Past Medical History  Diagnosis Date  . Systolic heart failure   . 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 Dyslipidemia   . HTN (hypertension)   . Atrial fibrillation   . Tobacco abuse   . Alcohol abuse   . H/O hiatal hernia   . Myocardial infarction   . Dysrhythmia     atrial fib  . Stroke 5/15  . CHF (congestive heart failure)   . Shortness of breath     occ  . GERD (gastroesophageal reflux disease)   . S/P chemotherapy, time since 4-12 weeks   . Cancer  around nose.  New dx of lung cancer  . Non-small cell carcinoma of lung, stage 3 12/16/2014    Assessment: 69 yo M transferred to [facility] on 04/28/2015 from the cancer center 2/2 afib with RVR with CC of dizziness and abnormal ECG.  Pharmacy consulted to dose cefepime and vancomycin for HCAP.  On Cipro 500 mg po bid PTA started 5/23.  5/25 CXR: new R lung opacity concerning for edema or PNA Tmax 98.5, WBC wnl, SCr trend up to 1.38  Cefepime 5/26>> vanc 5/26>>  5/26 flu>> 5/26 BC x2>>ngtd 5/26 sputum>> 5/26 HIV>> 5/26 legionella>> 5/26 strep pneumo urinary antigen>>NEG  Goal of Therapy:  Vancomycin trough level 15-20 mcg/ml  Plan:  - Cefepime 2 gm IV q8h - Vancomycin 750 mg IV q12h per obesity dosing  nomogram - F/u renal fxn, wbc, temp, C&S, vanc levels as needed - Will obtain vancomycin level prior to next dose to assess appropriateness  Lennyn Bellanca K. Velva Harman, PharmD, [place] Clinical Pharmacist - Resident Pager: [phone] Pharmacy: [phone] 04/30/2015 2:55 PM

## 2015-05-01 DIAGNOSIS — T451X5A Adverse effect of antineoplastic and immunosuppressive drugs, initial encounter: Secondary | ICD-10-CM

## 2015-05-01 DIAGNOSIS — D61810 Antineoplastic chemotherapy induced pancytopenia: Secondary | ICD-10-CM

## 2015-05-01 LAB — BASIC METABOLIC PANEL
ANION GAP: 7 (ref 5–15)
BUN: 19 mg/dL (ref 6–20)
CO2: 24 mmol/L (ref 22–32)
Calcium: 8.3 mg/dL — ABNORMAL LOW (ref 8.9–10.3)
Chloride: 103 mmol/L (ref 101–111)
Creatinine, Ser: 1.2 mg/dL (ref 0.61–1.24)
GFR calc Af Amer: >60 mL/min (ref >60)
GFR calc non Af Amer: 60 mL/min — ABNORMAL LOW (ref >60)
GLUCOSE: 110 mg/dL — AB (ref 65–99)
Potassium: 3.5 mmol/L (ref 3.5–5.1)
Sodium: 134 mmol/L — ABNORMAL LOW (ref 135–145)

## 2015-05-01 LAB — CBC
HCT: 25.5 % — ABNORMAL LOW (ref 39.0–52.0)
HEMOGLOBIN: 8.7 g/dL — AB (ref 13.0–17.0)
MCH: 31.3 pg (ref 26.0–34.0)
MCHC: 34.1 g/dL (ref 30.0–36.0)
MCV: 91.7 fL (ref 78.0–100.0)
Platelets: 66 10*3/uL — ABNORMAL LOW (ref 150–400)
RBC: 2.78 MIL/uL — ABNORMAL LOW (ref 4.22–5.81)
RDW: 15.5 % (ref 11.5–15.5)
WBC: 6.7 10*3/uL (ref 4.0–10.5)

## 2015-05-01 LAB — HIV ANTIBODY (ROUTINE TESTING W REFLEX): HIV Screen 4th Generation wRfx: NONREACTIVE

## 2015-05-01 LAB — MAGNESIUM: Magnesium: 1.3 mg/dL — ABNORMAL LOW (ref 1.7–2.4)

## 2015-05-01 MED ORDER — MAGNESIUM SULFATE 2 GM/50ML IV SOLN
2.0000 g | Freq: Once | INTRAVENOUS | Status: AC
  Administered 2015-05-01: 2 g via INTRAVENOUS
  Filled 2015-05-01: qty 50

## 2015-05-01 MED ORDER — MAGNESIUM SULFATE 4 GM/100ML IV SOLN
4.0000 g | Freq: Once | INTRAVENOUS | Status: AC
  Administered 2015-05-01: 4 g via INTRAVENOUS
  Filled 2015-05-01: qty 100

## 2015-05-01 MED ORDER — POTASSIUM CHLORIDE CRYS ER 20 MEQ PO TBCR
40.0000 meq | EXTENDED_RELEASE_TABLET | Freq: Once | ORAL | Status: AC
  Administered 2015-05-01: 40 meq via ORAL
  Filled 2015-05-01: qty 2

## 2015-05-01 MED ORDER — DEXTROSE 5 % IV SOLN
2.0000 g | Freq: Three times a day (TID) | INTRAVENOUS | Status: DC
  Administered 2015-05-01 – 2015-05-02 (×3): 2 g via INTRAVENOUS
  Filled 2015-05-01 (×4): qty 2

## 2015-05-01 NOTE — Progress Notes (Signed)
Pt K 3.5, Mg 1.3 on am labs.  MD notified.  Will continue to monitor.

## 2015-05-01 NOTE — Progress Notes (Signed)
Pt had 10 bts NSVT on tele.  Pt asymptomatic, will notify MD and continue to monitor.

## 2015-05-01 NOTE — Progress Notes (Signed)
Alejandro Magic, NP notified of 10 bts NSVT.  Mg level added to am labs.  Awaiting results.  Pt sitting in chair without complaints.  VSS.

## 2015-05-01 NOTE — Progress Notes (Signed)
Patient Name: Alejandro [name]      SUBJECTIVE: 69 year old gentleman with a history of systolic heart failure previous bypass surgery permanent atrial fibrillation non-small cell lung cancer receiving chemotherapy. Chest x-ray demonstrates a right pneumonia versus pulmonary edema  He was admitted 5/26 because of atrial fibrillation with a rapid rate and relative hypotension. The latter intinally mad it challenging to augment rate control.  It is hoped that the pneumonia being treated we'll decrease the stress   Echo EF 30-35%. Labs notable for anemia and thrombocytopenia. And now low Mag     Past Medical History  Diagnosis Date  . Systolic heart failure   . 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 Dyslipidemia   . HTN (hypertension)   . Atrial fibrillation   . Tobacco abuse   . Alcohol abuse   . H/O hiatal hernia   . Myocardial infarction   . Dysrhythmia     atrial fib  . Stroke 5/15  . CHF (congestive heart failure)   . Shortness of breath     occ  . GERD (gastroesophageal reflux disease)   . S/P chemotherapy, time since 4-12 weeks   . Cancer     around nose.  New dx of lung cancer  . Non-small cell carcinoma of lung, stage 3 12/16/2014    Scheduled Meds:  Scheduled Meds: . alteplase  2 mg Intracatheter Once  . ceFEPime (MAXIPIME) IV  2 g Intravenous Q8H  . digoxin  0.125 mg Oral Daily  . FLUoxetine  20 mg Oral Daily  . levETIRAcetam  500 mg Oral BID  . metoprolol tartrate  25 mg Oral BID  . multivitamin with minerals  1 tablet Oral Daily  . pantoprazole  40 mg Oral QHS  . simvastatin  40 mg Oral q1800  . vancomycin  750 mg Intravenous Q12H   Continuous Infusions: . sodium chloride 10 mL/hr at 05/01/15 0036   acetaminophen, albuterol, ondansetron (ZOFRAN) IV, sodium chloride    PHYSICAL EXAM Filed Vitals:   04/29/15 2040 04/30/15 0454 04/30/15 2016 05/01/15 0457  BP: 112/92 123/49 118/60 113/73  Pulse: 79 72 82 82  Temp:  97.7 F (36.5 C) 98.1 F (36.7 C) 98.1 F (36.7 C) 98.3 F (36.8 C)  TempSrc: Oral Oral Oral Oral  Resp: '17 18 18 18  '$ Height:      Weight:  111.766 kg (246 lb 6.4 oz)  111 kg (244 lb 11.4 oz)  SpO2: 98% 98% 99% 96%   Somnolent and not aroused IRR Good air movement  TELEMETRY: Reviewed telemetry pt in * afib with controlled rate   Intake/Output Summary (Last 24 hours) at 05/01/15 0756 Last data filed at 05/01/15 0648  Gross per 24 hour  Intake   1138 ml  Output   1725 ml  Net   -587 ml    LABS: Basic Metabolic Panel:  Recent Labs Lab 04/25/15 1219  04/28/15 1300 04/29/15 0534 04/30/15 0546 05/01/15 0530  NA 139  --  139 135 135 134*  K 3.8  --  3.8 3.5 3.6 3.5  CL  --   --  107 107 103 103  CO2 20*  --  21* '23 24 24  '$ GLUCOSE 158*  --  124* 102* 116* 110*  BUN 40.5*  --  22* 19 21* 19  CREATININE 1.5*  --  1.36* 1.25* 1.38* 1.20  CALCIUM 8.4  < > 9.1 8.4*  8.6* 8.3*  MG  --   --   --   --   --  1.3*  < > = values in this interval not displayed. Cardiac Enzymes: No results for input(s): CKTOTAL, CKMB, CKMBINDEX, TROPONINI in the last 72 hours. CBC:  Recent Labs Lab 04/25/15 1219 04/28/15 1300 04/29/15 0534 04/30/15 0546 05/01/15 0530  WBC 0.9* 8.0 5.6 6.2 6.7  NEUTROABS 0.4*  --   --   --   --   HGB 9.4* 9.1* 8.3* 9.2* 8.7*  HCT 27.8* 26.7* 24.6* 26.8* 25.5*  MCV 95.1 92.1 93.5 91.5 91.7  PLT 55* 62* 60* 66* 66*   PROTIME: No results for input(s): LABPROT, INR in the last 72 hours. Liver Function Tests: No results for input(s): AST, ALT, ALKPHOS, BILITOT, PROT, ALBUMIN in the last 72 hours. No results for input(s): LIPASE, AMYLASE in the last 72 hours. BNP: BNP (last 3 results)  Recent Labs  04/28/15 1300  BNP 293.9*    ProBNP (last 3 results)  Recent Labs  03/29/15 1133 04/05/15 1134  PROBNP 543.0* 252.0*    D-Dimer: No results for input(s): DDIMER in the last 72 hours. Hemoglobin A1C: No results for input(s): HGBA1C in the last  72 hours. Fasting Lipid Panel: No results for input(s): CHOL, HDL, LDLCALC, TRIG, CHOLHDL, LDLDIRECT in the last 72 hours. Thyroid Function Tests:  Recent Labs  04/28/15 2046  TSH 1.612       ASSESSMENT AND PLAN:  Principal Problem:   Atrial fibrillation with RVR Active Problems:   Dyslipidemia   Coronary atherosclerosis of native coronary artery   Chronic systolic congestive heart failure, NYHA class 3   COPD mixed type   Hepatic cirrhosis   Non-small cell carcinoma of lung, stage 3   Antineoplastic chemotherapy induced pancytopenia   Hoarseness of voice   HCAP (healthcare-associated pneumonia)   HR well controlled Aspirin has been recommended, but I'm not sure that there is any significant benefit as relates to atrial fibrillation and in the context of thrombocytopenia unless there is another indication I would be inclined to stop it actually more Euvolemic continue current meds Replete Mg  Signed, Virl Axe MD  05/01/2015

## 2015-05-01 NOTE — Progress Notes (Signed)
[facility] PROGRESS NOTE  CARRSON LIGHTCAP PYK:[id number] DOB: 20-May-1946 DOA: 04/28/2015 PCP: [name], MD  Assessment/Plan: Atrial fibrillation with RVR -Cardiology following -Rate controlled on metoprolol.  -was having problems with hypotension outpatient.  -Digoxin daily.  -will discontinue aspirin as recommended by Dr Caryl Comes.    Non-small cell carcinoma of lung, stage 3 -stable -Follow with Dr. Julien Nordmann   Antineoplastic chemotherapy induced pancytopenia -labs at baseline   HCAP (healthcare-associated pneumonia) -suspect etiology to right side infiltrate: immunocompromised -continue with vancomycin and cefepime over the weekend.  -blood csx no growth to date.  -HIV negative  , urine strep negative  and legionella pending.  - influenza PCR negative.  -plan to discharge tomorrow on oral antibiotics.    Chronic systolic congestive heart failure, NYHA class 3 -received one time dose of lasix 5-28. -Daily weight and strict I/O -Cardiology following.  -Lasix per cardiology.    COPD mixed type -Compensated.  -Albuterol PRN.    Hoarseness of voice -no evidence of dysphagia on Fees. Needs follow up with ENT>    Dyslipidemia   Coronary atherosclerosis of native coronary artery   Hepatic cirrhosis  Code Status: full code.  Family Communication: Care discussed with patient.  Disposition Plan: probably home at time of discharge. Plan to discharge 5-30   Consultants:  Cardiology  Procedures:  none  Antibiotics:  Vancomycin 5-26  Cefepime 5-26  HPI/Subjective: Feeling better today, breathing better./  No worsening cough   Objective: Filed Vitals:   05/01/15 0457  BP: 113/73  Pulse: 82  Temp: 98.3 F (36.8 C)  Resp: 18    Intake/Output Summary (Last 24 hours) at 05/01/15 1224 Last data filed at 05/01/15 1200  Gross per 24 hour  Intake    708 ml  Output   2075 ml  Net  -1367 ml   Filed Weights   04/28/15 1659 04/30/15 0454 05/01/15  0457  Weight: 111.9 kg (246 lb 11.1 oz) 111.766 kg (246 lb 6.4 oz) 111 kg (244 lb 11.4 oz)    Exam:   General: Alert in no distress.   Cardiovascular: S 1, S 2 IRR  Respiratory: decrease breath sounds right, few crackles.   Abdomen: bs present, soft, nt  Musculoskeletal: no edema  Data Reviewed: Basic Metabolic Panel:  Recent Labs Lab 04/25/15 1219 04/28/15 1300 04/29/15 0534 04/30/15 0546 05/01/15 0530  NA 139 139 135 135 134*  K 3.8 3.8 3.5 3.6 3.5  CL  --  107 107 103 103  CO2 20* 21* '23 24 24  '$ GLUCOSE 158* 124* 102* 116* 110*  BUN 40.5* 22* 19 21* 19  CREATININE 1.5* 1.36* 1.25* 1.38* 1.20  CALCIUM 8.4 9.1 8.4* 8.6* 8.3*  MG  --   --   --   --  1.3*   Liver Function Tests:  Recent Labs Lab 04/25/15 1219  AST 19  ALT 18  ALKPHOS 79  BILITOT 0.72  PROT 6.3*  ALBUMIN 3.1*   No results for input(s): LIPASE, AMYLASE in the last 168 hours. No results for input(s): AMMONIA in the last 168 hours. CBC:  Recent Labs Lab 04/25/15 1219 04/28/15 1300 04/29/15 0534 04/30/15 0546 05/01/15 0530  WBC 0.9* 8.0 5.6 6.2 6.7  NEUTROABS 0.4*  --   --   --   --   HGB 9.4* 9.1* 8.3* 9.2* 8.7*  HCT 27.8* 26.7* 24.6* 26.8* 25.5*  MCV 95.1 92.1 93.5 91.5 91.7  PLT 55* 62* 60* 66* 66*   Cardiac Enzymes: No results  for input(s): CKTOTAL, CKMB, CKMBINDEX, TROPONINI in the last 168 hours. BNP (last 3 results)  Recent Labs  04/28/15 1300  BNP 293.9*    ProBNP (last 3 results)  Recent Labs  03/29/15 1133 04/05/15 1134  PROBNP 543.0* 252.0*    CBG: No results for input(s): GLUCAP in the last 168 hours.  Recent Results (from the past 240 hour(s))  MRSA PCR Screening     Status: None   Collection Time: 04/28/15  5:38 PM  Result Value Ref Range Status   MRSA by PCR NEGATIVE NEGATIVE Final    Comment:        The GeneXpert MRSA Assay (FDA approved for NASAL specimens only), is one component of a comprehensive MRSA colonization surveillance program. It  is not intended to diagnose MRSA infection nor to guide or monitor treatment for MRSA infections.   Culture, blood (routine x 2) Call MD if unable to obtain prior to antibiotics being given     Status: None (Preliminary result)   Collection Time: 04/28/15  8:46 PM  Result Value Ref Range Status   Specimen Description BLOOD RIGHT HAND  Final   Special Requests BOTTLES DRAWN AEROBIC ONLY 10CC  Final   Culture   Final           BLOOD CULTURE RECEIVED NO GROWTH TO DATE CULTURE WILL BE HELD FOR 5 DAYS BEFORE ISSUING A FINAL NEGATIVE REPORT Note: Culture results may be compromised due to an excessive volume of blood received in culture bottles. Performed at [facility]    Report Status PENDING  Incomplete  Culture, blood (routine x 2) Call MD if unable to obtain prior to antibiotics being given     Status: None (Preliminary result)   Collection Time: 04/28/15  9:00 PM  Result Value Ref Range Status   Specimen Description BLOOD RIGHT HAND  Final   Special Requests BOTTLES DRAWN AEROBIC AND ANAEROBIC 5CC  Final   Culture   Final           BLOOD CULTURE RECEIVED NO GROWTH TO DATE CULTURE WILL BE HELD FOR 5 DAYS BEFORE ISSUING A FINAL NEGATIVE REPORT Performed at [facility]    Report Status PENDING  Incomplete     Studies: No results found.  Scheduled Meds: . alteplase  2 mg Intracatheter Once  . ceFEPime (MAXIPIME) IV  2 g Intravenous Q8H  . digoxin  0.125 mg Oral Daily  . FLUoxetine  20 mg Oral Daily  . levETIRAcetam  500 mg Oral BID  . metoprolol tartrate  25 mg Oral BID  . multivitamin with minerals  1 tablet Oral Daily  . pantoprazole  40 mg Oral QHS  . simvastatin  40 mg Oral q1800  . vancomycin  750 mg Intravenous Q12H   Continuous Infusions: . sodium chloride 10 mL/hr at 05/01/15 0036    Principal Problem:   Atrial fibrillation with RVR Active Problems:   Dyslipidemia   Coronary atherosclerosis of native coronary artery   Chronic systolic  congestive heart failure, NYHA class 3   COPD mixed type   Hepatic cirrhosis   Non-small cell carcinoma of lung, stage 3   Antineoplastic chemotherapy induced pancytopenia   Hoarseness of voice   HCAP (healthcare-associated pneumonia)    Time spent: 25 minutes.     Niel Hummer A  [facility] Pager [phone]. If 7PM-7AM, please contact night-coverage at [email or web address], password [facility] 05/01/2015, 12:24 PM  LOS: 3 days

## 2015-05-02 LAB — LEGIONELLA ANTIGEN, URINE

## 2015-05-02 LAB — PROCALCITONIN: Procalcitonin: 0.25 ng/mL

## 2015-05-02 MED ORDER — DIGOXIN 125 MCG PO TABS: 0.1250 mg | ORAL_TABLET | Freq: Every day | ORAL | Status: DC

## 2015-05-02 MED ORDER — AMOXICILLIN-POT CLAVULANATE 875-125 MG PO TABS: 1.0000 | ORAL_TABLET | Freq: Two times a day (BID) | ORAL | Status: DC

## 2015-05-02 MED ORDER — FUROSEMIDE 40 MG PO TABS: 40.0000 mg | ORAL_TABLET | Freq: Every day | ORAL | Status: DC

## 2015-05-02 MED ORDER — DIPHENHYDRAMINE HCL 50 MG/ML IJ SOLN: 25.0000 mg | Freq: Once | INTRAMUSCULAR | Status: DC

## 2015-05-02 MED ORDER — METOPROLOL TARTRATE 25 MG PO TABS: 25.0000 mg | ORAL_TABLET | Freq: Two times a day (BID) | ORAL | Status: DC

## 2015-05-02 MED ORDER — HEPARIN SOD (PORK) LOCK FLUSH 100 UNIT/ML IV SOLN
500.0000 [IU] | INTRAVENOUS | Status: AC | PRN
  Administered 2015-05-02: 500 [IU]

## 2015-05-02 MED ORDER — METHYLPREDNISOLONE SODIUM SUCC 125 MG IJ SOLR
125.0000 mg | Freq: Once | INTRAMUSCULAR | Status: DC
Start: 2015-05-02 — End: 2015-05-02

## 2015-05-02 NOTE — Progress Notes (Signed)
Physical Therapy Treatment Patient Details Name: Alejandro [name] MRN: [id number] DOB: 08/22/1946 Today's Date: 05/02/2015    History of Present Illness 69 yo male admitted with A fib with RVR. Hx of craniotomy 04/2014, lung cancer, A fib, CHF, ETOH abuse, MI, CVA.     PT Comments    Patient progressing well with mobility. Improved ambulation distance at Min guard level due to mild unsteadiness. Continues to have abnormal elevated HR during gait training requiring seated rest breaks. Education provided on energy conservation. Will continue to follow to maximize independence and endurance prior to return home.   Follow Up Recommendations  Home health PT;Supervision - Intermittent     Equipment Recommendations  None recommended by PT    Recommendations for Other Services       Precautions / Restrictions Precautions Precautions: Fall Precaution Comments: monitor vitals Restrictions Weight Bearing Restrictions: No    Mobility  Bed Mobility               General bed mobility comments: Sitting in chair upon PT arrival.   Transfers Overall transfer level: Needs assistance Equipment used: Rolling walker (2 wheeled) Transfers: Sit to/from Stand Sit to Stand: Supervision         General transfer comment: Supervision for safety. Good use of hands/technique. Stood from chair 1, from low surface x1.   Ambulation/Gait Ambulation/Gait assistance: Min guard Ambulation Distance (Feet): 120 Feet (x2 bouts) Assistive device: Rolling walker (2 wheeled) Gait Pattern/deviations: Step-through pattern;Decreased stride length;Trunk flexed   Gait velocity interpretation: Below normal speed for age/gender General Gait Details: Cues for RW management/safety. Dyspnea present. HR ranged from 92-136 bpm during gait A-fib. 1 seated rest break and cues for pursed lip breathing.   Stairs            Wheelchair Mobility    Modified Rankin (Stroke Patients Only)       Balance  Overall balance assessment: Needs assistance;History of Falls Sitting-balance support: Feet supported;No upper extremity supported Sitting balance-Leahy Scale: Good     Standing balance support: During functional activity Standing balance-Leahy Scale: Poor Standing balance comment: Relient on RW for support.                    Cognition Arousal/Alertness: Awake/alert Behavior During Therapy: WFL for tasks assessed/performed Overall Cognitive Status: No family/caregiver present to determine baseline cognitive functioning                      Exercises      General Comments        Pertinent Vitals/Pain Pain Assessment: No/denies pain    Home Living                      Prior Function            PT Goals (current goals can now be found in the care plan section) Progress towards PT goals: Progressing toward goals    Frequency  Min 3X/week    PT Plan Current plan remains appropriate    Co-evaluation             End of Session Equipment Utilized During Treatment: Gait belt Activity Tolerance: Patient tolerated treatment well Patient left: in chair;with call bell/phone within reach     Time: 0947-1006 PT Time Calculation (min) (ACUTE ONLY): 19 min  Charges:  $Gait Training: 8-22 mins  G Codes:      Manreet Kiernan A Rickell Wiehe 05/02/2015, 10:17 AM  Wray Kearns, PT, DPT [phone]

## 2015-05-02 NOTE — Progress Notes (Signed)
Pt discharged via W/C condition stable, accompanied by spouse.

## 2015-05-02 NOTE — Care Management Note (Signed)
Case Management Note  Patient Details  Name: Alejandro [name] MRN: [id number] Date of Birth: May 06, 1946  Subjective/Objective: 69 yo male admitted with A fib with RVR. Hx of craniotomy 04/2014, lung cancer, A fib, CHF, ETOH abuse, MI, and CVA.               Action/Plan: PT is recommending HHPT.   Expected Discharge Date: 05/02/15                  Expected Discharge Plan:  [place]  In-House Referral:     Discharge planning Services  CM Consult  Post Acute Care Choice:  Home Health Choice offered to:  Spouse  DME Arranged:    DME Agency:     HH Arranged:  PT, RN [place] Agency:  [place]  Status of Service:  Completed, signed off  Medicare Important Message Given:    Date Medicare IM Given:    Medicare IM give by:    Date Additional Medicare IM Given:    Additional Medicare Important Message give by:     If discussed at [place] of Stay Meetings, dates discussed:    Additional Comments: met with pt and wife. D/C plan is to return home with the support of his wife. He has a cane, rolling walker with a seat and an elevated toilet seat. He has used [facility] in the past and they prefer to use them again. Contacted Colletta Maryland at [facility] for referral.  Norina Buzzard, RN 05/02/2015, 11:31 AM

## 2015-05-02 NOTE — Progress Notes (Signed)
    Subjective:  Feeling better today. Still with some cough. No shortness of breath at rest. No edema.  Objective:  Vital Signs in the last 24 hours: Temp:  [97.7 F (36.5 C)-98.3 F (36.8 C)] 97.8 F (36.6 C) (05/30 0518) Pulse Rate:  [65-80] 80 (05/30 0518) Resp:  [17-23] 23 (05/30 0518) BP: (105-126)/(50-78) 109/78 mmHg (05/30 0518) SpO2:  [96 %-99 %] 96 % (05/30 0518) Weight:  [247 lb 3.9 oz (112.148 kg)] 247 lb 3.9 oz (112.148 kg) (05/30 0518)  Intake/Output from previous day: 05/29 0701 - 05/30 0700 In: 370 [P.O.:120; IV Piggyback:250] Out: 1600 [Urine:1600]  Physical Exam: Pt is alert and oriented, obese, chronically ill-appearing male in NAD HEENT: normal Neck: JVP - unable to visualize Lungs: Decreased BS R>L CV: irregular without murmur or gallop Abd: soft, NT, Positive BS, obese Ext: no C/C/E, distal pulses intact and equal Skin: warm/dry no rash   Lab Results:  Recent Labs  04/30/15 0546 05/01/15 0530  WBC 6.2 6.7  HGB 9.2* 8.7*  PLT 66* 66*    Recent Labs  04/30/15 0546 05/01/15 0530  NA 135 134*  K 3.6 3.5  CL 103 103  CO2 24 24  GLUCOSE 116* 110*  BUN 21* 19  CREATININE 1.38* 1.20   No results for input(s): TROPONINI in the last 72 hours.  Invalid input(s): CK, MB  Tele: Atrial fibrillation, heart rate controlled with ventricular rate 70-80's  Assessment/Plan:  1. Atrial fib with RVR 2. CAD s/p CABG 3. 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 Pneumonia  Pt stble from cardiac perspective. Would continue combination of metoprolol and digoxin at current doses. Not a candidate for anticoagulation as outlined previously. Will arrange outpatient follow-up. Gaynell Face, M.D. 05/02/2015, 7:59 AM

## 2015-05-02 NOTE — Discharge Summary (Signed)
Physician Discharge Summary  Alejandro [name] IRW:[id number] DOB: [birth year] DOA: 04/28/2015  PCP: [name], MD  Admit date: 04/28/2015 Discharge date: 05/02/2015  Time spent: 35 minutes  Recommendations for Outpatient Follow-up:  Needs CBC and Bmet to follow hb and renal function.  Needs follow chest x ray   Discharge Diagnoses:    Atrial fibrillation with RVR HCAP (healthcare-associated pneumonia)   Dyslipidemia   Coronary atherosclerosis of native coronary artery   Chronic systolic congestive heart failure, NYHA class 3   COPD mixed type   Hepatic cirrhosis   Non-small cell carcinoma of lung, stage 3   Antineoplastic chemotherapy induced pancytopenia   Hoarseness of voice      Discharge Condition: stable.   Diet recommendation: [place] Healthy  Filed Weights   04/30/15 0454 05/01/15 0457 05/02/15 0518  Weight: 111.766 kg (246 lb 6.4 oz) 111 kg (244 lb 11.4 oz) 112.148 kg (247 lb 3.9 oz)    History of present illness:  This is a 69 yo male w/ PMH: HTN, traumatic SDH post craniotomy,Chronic SHF/EF 35%, AF not on anticoagulation 2/2 chemo induced pancytopenia, lung CA (nonsm cell stage III) who presented to ER from cancer center 2/2 AF/RVR. 2 days prior SBP 80s so BB dc'd and given 1 L NS bolus- returned today for BP ck when found w/ RVR (150-160s).  In ER: found with AF/RVR-given IV Lopressor but this dropped SBP from 140s to low 100s so EDP changed to IV Cardizem. CXR with right fluffy infiltrate c/w edema or PNA. No edema in extremities. BNP 293, TNI 0.05, K 3.8, BUN 22 Cr 1.36 (near baseline), hgb 9.1, platelets 62,000, WBC 8,000 (in May was 900)  Pt reports chronic cough since ETT for craniotomy and assoc with chronic hoarseness, on Cipro for infection prophylaxis, he cont'd to take Lasix after BB stopped, denies fever,chills, denies poor oral intake.  Hospital Course:  Atrial fibrillation with RVR -Cardiology following -Rate controlled on metoprolol.  -was having problems  with hypotension outpatient.  -Digoxin daily.  -will discontinue aspirin as recommended by Dr Caryl Comes.    Non-small cell carcinoma of lung, stage 3 -stable -Follow with Dr. Julien Nordmann   Antineoplastic chemotherapy induced pancytopenia -labs at baseline   HCAP (healthcare-associated pneumonia) -suspect etiology to right side infiltrate: immunocompromised -Received vancomycin and cefepime for 5 days.  -blood csx no growth to date.  -HIV negative , urine strep negative and legionella pending.  - influenza PCR negative.  -plan to discharge today on Augmentin for 3 more days.    Chronic systolic congestive heart failure, NYHA class 3 -received one time dose of lasix 5-28. -Daily weight and strict I/O -Cardiology following.  -resume lower dose home lasix.    COPD mixed type -Compensated.  -Albuterol PRN.    Hoarseness of voice -no evidence of dysphagia on Fees. Needs follow up with ENT>    Dyslipidemia   Coronary atherosclerosis of native coronary artery   Hepatic cirrhosis  Procedures:  none  Consultations:  Cardiology  Discharge Exam: Filed Vitals:   05/02/15 0518  BP: 109/78  Pulse: 80  Temp: 97.8 F (36.6 C)  Resp: 23    General: Alert in no distress.  Cardiovascular: S 1, S 2 IRR Respiratory: no wheezing, sporadic crackles.   Discharge Instructions   Discharge Instructions    Diet - low sodium heart healthy    Complete by:  As directed      Increase activity slowly    Complete by:  As directed  Current Discharge Medication List    START taking these medications   Details  amoxicillin-clavulanate (AUGMENTIN) 875-125 MG per tablet Take 1 tablet by mouth 2 (two) times daily. Qty: 6 tablet, Refills: 0    digoxin (LANOXIN) 0.125 MG tablet Take 1 tablet (0.125 mg total) by mouth daily. Qty: 30 tablet, Refills: 0    metoprolol tartrate (LOPRESSOR) 25 MG tablet Take 1 tablet (25 mg total) by mouth 2 (two) times daily. Qty:  30 tablet, Refills: 0      CONTINUE these medications which have CHANGED   Details  furosemide (LASIX) 40 MG tablet Take 1 tablet (40 mg total) by mouth daily. Qty: 45 tablet, Refills: 6      CONTINUE these medications which have NOT CHANGED   Details  albuterol (PROVENTIL HFA;VENTOLIN HFA) 108 (90 BASE) MCG/ACT inhaler Inhale 2 puffs into the lungs every 4 (four) hours as needed for wheezing or shortness of breath. Qty: 1 Inhaler, Refills: 6    aspirin 81 MG tablet Take 81 mg by mouth daily.    FLUoxetine (PROZAC) 20 MG capsule Take 1 capsule (20 mg total) by mouth daily. Qty: 30 capsule, Refills: 6    levETIRAcetam (KEPPRA) 500 MG tablet Take 1 tablet (500 mg total) by mouth 2 (two) times daily. Qty: 60 tablet, Refills: 6    lidocaine-prilocaine (EMLA) cream Apply 1 application topically as needed. Qty: 30 g, Refills: 2   Associated Diagnoses: Non-small cell carcinoma of lung, stage 3, unspecified laterality    Multiple Vitamin (MULTIVITAMIN WITH MINERALS) TABS tablet Take 1 tablet by mouth daily.    pantoprazole (PROTONIX) 40 MG tablet Take 1 tablet (40 mg total) by mouth at bedtime. Qty: 30 tablet, Refills: 6    potassium chloride SA (K-DUR,KLOR-CON) 20 MEQ tablet Take 1 tablet (20 mEq total) by mouth daily. Qty: 30 tablet, Refills: 6   Associated Diagnoses: Primary cancer of right upper lobe of lung    simvastatin (ZOCOR) 40 MG tablet Take 1 tablet (40 mg total) by mouth every evening. Qty: 30 tablet, Refills: 6    UNABLE TO FIND Med Name:Wheelchair Per medical necessity patient requires wheelchair to all perform daily activities. Pt is able to self propel chair. Unable to use can or walker due to balance Qty: 1 each, Refills: 1      STOP taking these medications     ciprofloxacin (CIPRO) 500 MG tablet        No Known Allergies Follow-up Information    Follow up with Richardson Dopp, PA-C.   Specialties:  Physician Assistant, Radiology, Interventional  Cardiology   Why:  the office will call with date and time   Contact information:   [address] [place] [address] [phone]        The results of significant diagnostics from this hospitalization (including imaging, microbiology, ancillary and laboratory) are listed below for reference.    Significant Diagnostic Studies: Dg Chest Port 1 View  04/29/2015   CLINICAL DATA:  Shortness of breath.  EXAM: PORTABLE CHEST - 1 VIEW  COMPARISON:  04/28/2015.  FINDINGS: Power port in stable position. Prior CABG. Stable cardiomegaly. Progressive diffuse right lung infiltrate. Mild left base infiltrate. No pneumothorax.  IMPRESSION: 1. Power port catheter stable position. 2. Persistent diffuse right lung infiltrate. Mild left base infiltrate. 3. Prior CABG.  Stable cardiomegaly.   Electronically Signed   By: Marcello Moores  Register   On: 04/29/2015 07:52   Dg Chest Portable 1 View  04/28/2015  CLINICAL DATA:  Dizziness.  EXAM: PORTABLE CHEST - 1 VIEW  COMPARISON:  November 29, 2014.  FINDINGS: Stable cardiomediastinal silhouette. Sternotomy wires are noted. Visualized portion of left lung appears clear. Left lung base is not included in field-of-view. Increased airspace and interstitial opacity is noted in right lung concerning for pneumonia or edema. No definite pleural effusion or pneumothorax is noted. Right internal jugular Port-A-Cath is again noted and unchanged in position. Bony thorax is intact.  IMPRESSION: New right lung opacity is noted concerning for edema or pneumonia. Follow-up radiographs are recommended.   Electronically Signed   By: Marijo Conception, M.D.   On: 04/28/2015 13:26    Microbiology: Recent Results (from the past 240 hour(s))  MRSA PCR Screening     Status: None   Collection Time: 04/28/15  5:38 PM  Result Value Ref Range Status   MRSA by PCR NEGATIVE NEGATIVE Final    Comment:        The GeneXpert MRSA Assay (FDA approved for NASAL specimens only), is  one component of a comprehensive MRSA colonization surveillance program. It is not intended to diagnose MRSA infection nor to guide or monitor treatment for MRSA infections.   Culture, blood (routine x 2) Call MD if unable to obtain prior to antibiotics being given     Status: None (Preliminary result)   Collection Time: 04/28/15  8:46 PM  Result Value Ref Range Status   Specimen Description BLOOD RIGHT HAND  Final   Special Requests BOTTLES DRAWN AEROBIC ONLY 10CC  Final   Culture   Final           BLOOD CULTURE RECEIVED NO GROWTH TO DATE CULTURE WILL BE HELD FOR 5 DAYS BEFORE ISSUING A FINAL NEGATIVE REPORT Note: Culture results may be compromised due to an excessive volume of blood received in culture bottles. Performed at [facility]    Report Status PENDING  Incomplete  Culture, blood (routine x 2) Call MD if unable to obtain prior to antibiotics being given     Status: None (Preliminary result)   Collection Time: 04/28/15  9:00 PM  Result Value Ref Range Status   Specimen Description BLOOD RIGHT HAND  Final   Special Requests BOTTLES DRAWN AEROBIC AND ANAEROBIC 5CC  Final   Culture   Final           BLOOD CULTURE RECEIVED NO GROWTH TO DATE CULTURE WILL BE HELD FOR 5 DAYS BEFORE ISSUING A FINAL NEGATIVE REPORT Performed at [facility]    Report Status PENDING  Incomplete     Labs: Basic Metabolic Panel:  Recent Labs Lab 04/25/15 1219 04/28/15 1300 04/29/15 0534 04/30/15 0546 05/01/15 0530  NA 139 139 135 135 134*  K 3.8 3.8 3.5 3.6 3.5  CL  --  107 107 103 103  CO2 20* 21* '23 24 24  '$ GLUCOSE 158* 124* 102* 116* 110*  BUN 40.5* 22* 19 21* 19  CREATININE 1.5* 1.36* 1.25* 1.38* 1.20  CALCIUM 8.4 9.1 8.4* 8.6* 8.3*  MG  --   --   --   --  1.3*   Liver Function Tests:  Recent Labs Lab 04/25/15 1219  AST 19  ALT 18  ALKPHOS 79  BILITOT 0.72  PROT 6.3*  ALBUMIN 3.1*   No results for input(s): LIPASE, AMYLASE in the last 168 hours. No  results for input(s): AMMONIA in the last 168 hours. CBC:  Recent Labs Lab 04/25/15 1219 04/28/15 1300 04/29/15 0534 04/30/15  9292 05/01/15 0530  WBC 0.9* 8.0 5.6 6.2 6.7  NEUTROABS 0.4*  --   --   --   --   HGB 9.4* 9.1* 8.3* 9.2* 8.7*  HCT 27.8* 26.7* 24.6* 26.8* 25.5*  MCV 95.1 92.1 93.5 91.5 91.7  PLT 55* 62* 60* 66* 66*   Cardiac Enzymes: No results for input(s): CKTOTAL, CKMB, CKMBINDEX, TROPONINI in the last 168 hours. BNP: BNP (last 3 results)  Recent Labs  04/28/15 1300  BNP 293.9*    ProBNP (last 3 results)  Recent Labs  03/29/15 1133 04/05/15 1134  PROBNP 543.0* 252.0*    CBG: No results for input(s): GLUCAP in the last 168 hours.     Signed:  Niel Hummer A  [facility] 05/02/2015, 8:38 AM

## 2015-05-02 NOTE — Progress Notes (Addendum)
Occupational Therapy Treatment Patient Details Name: Alejandro [name] MRN: [id number] DOB: 01-Dec-1946 Today's Date: 05/02/2015    History of present illness 69 y.o. male admitted with A fib with RVR. Hx of craniotomy 04/2014, lung cancer, A fib, CHF, ETOH abuse, MI, CVA.    OT comments  Pt agreeable to OT session and motivated to walk. Education provided in session.  Follow Up Recommendations  No OT follow up;Supervision/Assistance - 24 hour    Equipment Recommendations  None recommended by OT    Recommendations for Other Services      Precautions / Restrictions Precautions Precautions: Fall Precaution Comments: monitor vitals Restrictions Weight Bearing Restrictions: No       Mobility Bed Mobility               General bed mobility comments: not assessed  Transfers Overall transfer level: Needs assistance Transfers: Sit to/from Stand Sit to Stand: Supervision         General transfer comment: cues for hand placement.    Balance Min guard for ambulation and pivotal steps to bed.  Pt unsteady on feet.               ADL Overall ADL's : Needs assistance/impaired     Grooming: Applying deodorant;Wash/dry face;Supervision/safety;Set up;Sitting   Upper Body Bathing: Standing;Set up;Supervision/ safety (washed under arms)   Lower Body Bathing: Min guard;Sit to/from stand (washed bottom)       Lower Body Dressing: Set up;Supervision/safety;Sitting/lateral leans (donned socks)   Toilet Transfer: Min guard;Ambulation;RW (bed/chair; ambulated with and without walker; also took pivotal steps to bed and ambulated in room short distance)           Functional mobility during ADLs: Min guard;Rolling walker (ambulated with and without walker) General ADL Comments: Educated on energy conservation techniques and deep breathing technique. Pt taking breaks in session. Pt more steady with walker. Educated on safety such as sitting for LB ADLs.      Vision                      Perception     Praxis      Cognition  Awake/Alert Behavior During Therapy: WFL for tasks assessed/performed Overall Cognitive Status: Within Functional Limits for tasks assessed                       Extremity/Trunk Assessment               Exercises     Shoulder Instructions       General Comments      Pertinent Vitals/ Pain       Pain Assessment: No/denies pain; HR up to 136 in session.   Home Living                                          Prior Functioning/Environment              Frequency Min 2X/week     Progress Toward Goals  OT Goals(current goals can now be found in the care plan section)  Progress towards OT goals: Progressing toward goals  Acute Rehab OT Goals Patient Stated Goal: to be more steady with walking OT Goal Formulation: With patient Time For Goal Achievement: 05/13/15 Potential to Achieve Goals: Good ADL Goals Pt Will Perform Lower Body Bathing: with set-up;with supervision;with caregiver independent in  assisting;sit to/from stand Pt Will Perform Lower Body Dressing: with supervision;with set-up;with caregiver independent in assisting;sit to/from stand Pt Will Transfer to Toilet: with supervision;ambulating;regular height toilet;bedside commode Pt Will Perform Toileting - Clothing Manipulation and hygiene: with supervision;sit to/from stand Additional ADL Goal #1: Pt will verbalize understanding of 3 energy conservation techniques for ADL  Plan Discharge plan remains appropriate    Co-evaluation                 End of Session Equipment Utilized During Treatment: Gait belt;Rolling walker   Activity Tolerance Patient tolerated treatment well;Patient limited by fatigue   Patient Left in chair;with call bell/phone within reach   Nurse Communication  HR up to 136 in session; asked to unhook from monitor for PT to walk.        Time: 5872-7618 OT Time Calculation (min):  19 min  Charges: OT General Charges $OT Visit: 1 Procedure OT Treatments $Self Care/Home Management : 8-22 mins  Benito Mccreedy OTR/L [phone] 05/02/2015, 10:28 AM

## 2015-05-03 ENCOUNTER — Other Ambulatory Visit (HOSPITAL_BASED_OUTPATIENT_CLINIC_OR_DEPARTMENT_OTHER): Payer: BLUE CROSS/BLUE SHIELD

## 2015-05-03 ENCOUNTER — Ambulatory Visit (HOSPITAL_BASED_OUTPATIENT_CLINIC_OR_DEPARTMENT_OTHER): Payer: BLUE CROSS/BLUE SHIELD

## 2015-05-03 VITALS — BP 132/59 | HR 84 | Temp 98.5°F | Resp 20

## 2015-05-03 DIAGNOSIS — Z95828 Presence of other vascular implants and grafts: Secondary | ICD-10-CM

## 2015-05-03 DIAGNOSIS — C3492 Malignant neoplasm of unspecified part of left bronchus or lung: Secondary | ICD-10-CM

## 2015-05-03 DIAGNOSIS — C3411 Malignant neoplasm of upper lobe, right bronchus or lung: Secondary | ICD-10-CM | POA: Diagnosis not present

## 2015-05-03 LAB — CBC WITH DIFFERENTIAL/PLATELET
BASO%: 0.4 % (ref 0.0–2.0)
Basophils Absolute: 0 10*3/uL (ref 0.0–0.1)
EOS ABS: 0 10*3/uL (ref 0.0–0.5)
EOS%: 0.1 % (ref 0.0–7.0)
HCT: 25.3 % — ABNORMAL LOW (ref 38.4–49.9)
HGB: 8.7 g/dL — ABNORMAL LOW (ref 13.0–17.1)
LYMPH#: 0.5 10*3/uL — AB (ref 0.9–3.3)
LYMPH%: 6.1 % — AB (ref 14.0–49.0)
MCH: 31.9 pg (ref 27.2–33.4)
MCHC: 34.5 g/dL (ref 32.0–36.0)
MCV: 92.7 fL (ref 79.3–98.0)
MONO#: 0.7 10*3/uL (ref 0.1–0.9)
MONO%: 8.7 % (ref 0.0–14.0)
NEUT#: 6.7 10*3/uL — ABNORMAL HIGH (ref 1.5–6.5)
NEUT%: 84.7 % — AB (ref 39.0–75.0)
Platelets: 90 10*3/uL — ABNORMAL LOW (ref 140–400)
RBC: 2.73 10*6/uL — ABNORMAL LOW (ref 4.20–5.82)
RDW: 16.1 % — ABNORMAL HIGH (ref 11.0–14.6)
WBC: 7.9 10*3/uL (ref 4.0–10.3)

## 2015-05-03 LAB — COMPREHENSIVE METABOLIC PANEL (CC13)
ALT: 19 U/L (ref 0–55)
ANION GAP: 9 meq/L (ref 3–11)
AST: 22 U/L (ref 5–34)
Albumin: 2.8 g/dL — ABNORMAL LOW (ref 3.5–5.0)
Alkaline Phosphatase: 91 U/L (ref 40–150)
BILIRUBIN TOTAL: 0.57 mg/dL (ref 0.20–1.20)
BUN: 16.1 mg/dL (ref 7.0–26.0)
CHLORIDE: 104 meq/L (ref 98–109)
CO2: 23 meq/L (ref 22–29)
Calcium: 8.8 mg/dL (ref 8.4–10.4)
Creatinine: 1 mg/dL (ref 0.7–1.3)
EGFR: 78 mL/min/{1.73_m2} — AB (ref >90)
GLUCOSE: 109 mg/dL (ref 70–140)
Potassium: 3.9 mEq/L (ref 3.5–5.1)
SODIUM: 136 meq/L (ref 136–145)
TOTAL PROTEIN: 6.3 g/dL — AB (ref 6.4–8.3)

## 2015-05-03 MED ORDER — HEPARIN SOD (PORK) LOCK FLUSH 100 UNIT/ML IV SOLN
500.0000 [IU] | Freq: Once | INTRAVENOUS | Status: AC
Start: 2015-05-03 — End: 2015-05-03
  Administered 2015-05-03: 500 [IU] via INTRAVENOUS
  Filled 2015-05-03: qty 5

## 2015-05-03 MED ORDER — SODIUM CHLORIDE 0.9 % IJ SOLN
10.0000 mL | INTRAMUSCULAR | Status: DC | PRN
  Administered 2015-05-03: 10 mL via INTRAVENOUS
  Filled 2015-05-03: qty 10

## 2015-05-03 NOTE — Patient Instructions (Signed)

## 2015-05-04 ENCOUNTER — Telehealth: Payer: Self-pay | Admitting: *Deleted

## 2015-05-04 DIAGNOSIS — J189 Pneumonia, unspecified organism: Secondary | ICD-10-CM

## 2015-05-04 NOTE — Telephone Encounter (Signed)
If he has already two follow-ups, it may be too taxing to the patient to come to this office. Please call the patient, advised to come back in 3-4 months for a routine checkup unless he feels he needs to see me sooner. Arrange a chest x-ray 2 weeks from today, dx f/u pneumonia.

## 2015-05-04 NOTE — Telephone Encounter (Signed)
Patient was in hospital for a-fib with RVR and pneumonia- has follow-up with cardiology and oncology.  Would you like for patient to follow up here as well?

## 2015-05-05 LAB — CULTURE, BLOOD (ROUTINE X 2)
CULTURE: NO GROWTH
Culture: NO GROWTH

## 2015-05-05 NOTE — Addendum Note (Signed)
Addended by: Leticia Penna A on: 05/05/2015 10:10 AM   Modules accepted: Orders

## 2015-05-05 NOTE — Telephone Encounter (Signed)
Transition Care Management Follow-up Telephone Call  How have you been since you were released from the hospital? "He is doing well, still coughing, but it is getting better"    Do you understand why you were in the hospital? YES    Do you understand the discharge instrcutions? YES  Items Reviewed:  Medications reviewed: YES   Allergies reviewed: YES  Dietary changes reviewed: YES   Referrals reviewed: YES- patient scheduled with cardiology and oncology as well    Functional Questionnaire:   Activities of Daily Living (ADLs):   He states they are independent in the following: ambulating, feeding, restroom  States they require assistance with the following: medications, transportation, wife is helping    Any transportation issues/concerns?: NO    Any patient concerns? Wife is concerned that patient is still coughing, CXR ordered per note below.     Confirmed importance and date/time of follow-up visits scheduled: YES- scheduled with Dr. Larose Kells 05/12/15   Confirmed with patient if condition begins to worsen call PCP or go to the ER.  Patient was given the Call-a-Nurse line [phone]: YES

## 2015-05-06 ENCOUNTER — Telehealth: Payer: Self-pay | Admitting: Internal Medicine

## 2015-05-06 NOTE — Telephone Encounter (Signed)
Spoke with Katherine, verbal orders given for home care.

## 2015-05-06 NOTE — Telephone Encounter (Signed)
Caller name:[facility] Relation to ET:KKOE Call back number:[phone] Pharmacy:  Reason for call: mr ruffins just came out of the hospital for pnuemonia, needs verbal order to send home care nurse for physical therapy for twice a week for 3 weeks, and once a week for 2 weeks.

## 2015-05-06 NOTE — Telephone Encounter (Signed)
Please advise 

## 2015-05-06 NOTE — Telephone Encounter (Signed)
That is okay, thank you 

## 2015-05-09 ENCOUNTER — Other Ambulatory Visit: Payer: BLUE CROSS/BLUE SHIELD

## 2015-05-09 ENCOUNTER — Telehealth: Payer: Self-pay | Admitting: Internal Medicine

## 2015-05-09 ENCOUNTER — Encounter: Payer: Self-pay | Admitting: Physician Assistant

## 2015-05-09 ENCOUNTER — Other Ambulatory Visit (HOSPITAL_BASED_OUTPATIENT_CLINIC_OR_DEPARTMENT_OTHER): Payer: BLUE CROSS/BLUE SHIELD

## 2015-05-09 ENCOUNTER — Ambulatory Visit: Payer: BLUE CROSS/BLUE SHIELD

## 2015-05-09 ENCOUNTER — Ambulatory Visit (HOSPITAL_BASED_OUTPATIENT_CLINIC_OR_DEPARTMENT_OTHER): Payer: BLUE CROSS/BLUE SHIELD | Admitting: Physician Assistant

## 2015-05-09 VITALS — BP 95/57 | HR 77 | Temp 98.2°F | Resp 19 | Ht 68.0 in | Wt 238.4 lb

## 2015-05-09 DIAGNOSIS — C3411 Malignant neoplasm of upper lobe, right bronchus or lung: Secondary | ICD-10-CM

## 2015-05-09 DIAGNOSIS — R32 Unspecified urinary incontinence: Secondary | ICD-10-CM

## 2015-05-09 DIAGNOSIS — C3492 Malignant neoplasm of unspecified part of left bronchus or lung: Secondary | ICD-10-CM

## 2015-05-09 DIAGNOSIS — B379 Candidiasis, unspecified: Secondary | ICD-10-CM

## 2015-05-09 DIAGNOSIS — R4781 Slurred speech: Secondary | ICD-10-CM

## 2015-05-09 DIAGNOSIS — D630 Anemia in neoplastic disease: Secondary | ICD-10-CM | POA: Diagnosis not present

## 2015-05-09 DIAGNOSIS — R0602 Shortness of breath: Secondary | ICD-10-CM | POA: Diagnosis not present

## 2015-05-09 DIAGNOSIS — Z95828 Presence of other vascular implants and grafts: Secondary | ICD-10-CM

## 2015-05-09 DIAGNOSIS — R200 Anesthesia of skin: Secondary | ICD-10-CM

## 2015-05-09 DIAGNOSIS — R29898 Other symptoms and signs involving the musculoskeletal system: Secondary | ICD-10-CM

## 2015-05-09 LAB — COMPREHENSIVE METABOLIC PANEL (CC13)
ALBUMIN: 2.9 g/dL — AB (ref 3.5–5.0)
ALT: 29 U/L (ref 0–55)
AST: 29 U/L (ref 5–34)
Alkaline Phosphatase: 91 U/L (ref 40–150)
Anion Gap: 9 mEq/L (ref 3–11)
BUN: 18.1 mg/dL (ref 7.0–26.0)
CHLORIDE: 103 meq/L (ref 98–109)
CO2: 26 mEq/L (ref 22–29)
CREATININE: 1.1 mg/dL (ref 0.7–1.3)
Calcium: 9.2 mg/dL (ref 8.4–10.4)
EGFR: 72 mL/min/{1.73_m2} — AB (ref >90)
GLUCOSE: 94 mg/dL (ref 70–140)
Potassium: 3.9 mEq/L (ref 3.5–5.1)
SODIUM: 138 meq/L (ref 136–145)
Total Bilirubin: 0.56 mg/dL (ref 0.20–1.20)
Total Protein: 6.7 g/dL (ref 6.4–8.3)

## 2015-05-09 LAB — CBC WITH DIFFERENTIAL/PLATELET
BASO%: 0.4 % (ref 0.0–2.0)
Basophils Absolute: 0 10*3/uL (ref 0.0–0.1)
EOS%: 0.9 % (ref 0.0–7.0)
Eosinophils Absolute: 0.1 10*3/uL (ref 0.0–0.5)
HEMATOCRIT: 25.7 % — AB (ref 38.4–49.9)
HGB: 8.8 g/dL — ABNORMAL LOW (ref 13.0–17.1)
LYMPH#: 0.8 10*3/uL — AB (ref 0.9–3.3)
LYMPH%: 14.2 % (ref 14.0–49.0)
MCH: 31.7 pg (ref 27.2–33.4)
MCHC: 34.2 g/dL (ref 32.0–36.0)
MCV: 92.4 fL (ref 79.3–98.0)
MONO#: 0.8 10*3/uL (ref 0.1–0.9)
MONO%: 14.7 % — ABNORMAL HIGH (ref 0.0–14.0)
NEUT#: 4 10*3/uL (ref 1.5–6.5)
NEUT%: 69.8 % (ref 39.0–75.0)
Platelets: 151 10*3/uL (ref 140–400)
RBC: 2.78 10*6/uL — AB (ref 4.20–5.82)
RDW: 16.4 % — ABNORMAL HIGH (ref 11.0–14.6)
WBC: 5.7 10*3/uL (ref 4.0–10.3)
nRBC: 0 % (ref 0–0)

## 2015-05-09 MED ORDER — HEPARIN SOD (PORK) LOCK FLUSH 100 UNIT/ML IV SOLN
500.0000 [IU] | Freq: Once | INTRAVENOUS | Status: AC
  Administered 2015-05-09: 500 [IU] via INTRAVENOUS
  Filled 2015-05-09: qty 5

## 2015-05-09 MED ORDER — SODIUM CHLORIDE 0.9 % IJ SOLN
10.0000 mL | INTRAMUSCULAR | Status: DC | PRN
  Administered 2015-05-09: 10 mL via INTRAVENOUS
  Filled 2015-05-09: qty 10

## 2015-05-09 MED ORDER — FLUCONAZOLE 100 MG PO TABS: 100.0000 mg | ORAL_TABLET | Freq: Every day | ORAL | Status: DC

## 2015-05-09 NOTE — Patient Instructions (Signed)
Your being scheduled for an MRI of your head and spine to evaluate your recent issues with urinary and bowel incontinence and slurred speech If chemotherapy is being postponed by 1 week Take Diflucan as prescribed to treat your thrush Follow-up one week or sooner as directed.

## 2015-05-09 NOTE — Telephone Encounter (Signed)
Gave adn printed appt sched and avs fo rpt for June and July

## 2015-05-09 NOTE — Progress Notes (Signed)
[facility] [phone]   Fax:([phone]) [place], [place] [address] [place] [address]  DIAGNOSIS: Non-small cell carcinoma of lung, stage 3  Staging form: Lung, AJCC 7th Edition  Clinical stage from 12/16/2014: Stage IIIA (T1b, N2, M0) - Signed by Curt Bears, MD on 12/18/2014  Staging comments: Squamous cell carcinoma  PRIOR THERAPY: Concurrent chemoradiation with weekly carboplatin for an AUC of 2 and paclitaxel 45 mg/m2.  CURRENT THERAPY: Consolidation chemotherapy with carboplatin AUC of 5 and paclitaxel 175 mg/m given every 3 weeks with Neulasta support started on 03/28/2015. Status post 2 cycles  INTERVAL HISTORY: Alejandro [name] 69 y.o. male returns to the clinic today for follow-up visit accompanied by his wife and daughter. He is status post 2 cycles of  consolidation chemotherapy with carboplatin and Taxol with the exception of the development of a rash. The rash developed approximate 12 days following his Neulasta injection. He was treated with Benadryl, Pepcid, and a steroid taper. His rash has resolved. He presents today not quite filling up to proceed with cycle #3 of his consolidation chemotherapy as scheduled today. Overall he feels "bad". He notes some numbness at his fingertips as well as the soles of his feet. More subtle left foot than the right foot. He notes some increased weakness in his lower extremities although has not sustained any falls. He also has had some bowel and bladder incontinence and some episodes of slurred speech in the past few days. The patient denied having any significant chest pain but continues to have shortness breath with exertion without cough or hemoptysis.  He has no significant weight loss or night sweats. He has no nausea or vomiting, no fever or chills.   MEDICAL HISTORY: Past Medical History  Diagnosis Date  . Systolic heart failure   . CAD (coronary  artery disease)     s/p CABGx4 on 12/24/2008  . Dyslipidemia   . HTN (hypertension)   . Atrial fibrillation   . Tobacco abuse   . Alcohol abuse   . H/O hiatal hernia   . Myocardial infarction   . Dysrhythmia     atrial fib  . Stroke 5/15  . CHF (congestive heart failure)   . Shortness of breath     occ  . GERD (gastroesophageal reflux disease)   . S/P chemotherapy, time since 4-12 weeks   . Cancer     around nose.  New dx of lung cancer  . Non-small cell carcinoma of lung, stage 3 12/16/2014    ALLERGIES:  has No Known Allergies.  MEDICATIONS:  Current Outpatient Prescriptions  Medication Sig Dispense Refill  . albuterol (PROVENTIL HFA;VENTOLIN HFA) 108 (90 BASE) MCG/ACT inhaler Inhale 2 puffs into the lungs every 4 (four) hours as needed for wheezing or shortness of breath. 1 Inhaler 6  . aspirin 81 MG tablet Take 81 mg by mouth daily.    . digoxin (LANOXIN) 0.125 MG tablet Take 1 tablet (0.125 mg total) by mouth daily. 30 tablet 0  . FLUoxetine (PROZAC) 20 MG capsule Take 1 capsule (20 mg total) by mouth daily. 30 capsule 6  . furosemide (LASIX) 40 MG tablet Take 1 tablet (40 mg total) by mouth daily. 45 tablet 6  . levETIRAcetam (KEPPRA) 500 MG tablet Take 1 tablet (500 mg total) by mouth 2 (two) times daily. 60 tablet 6  . lidocaine-prilocaine (EMLA) cream Apply 1 application topically as  needed. (Patient taking differently: Apply 1 application topically as needed (pain). ) 30 g 2  . metoprolol tartrate (LOPRESSOR) 25 MG tablet Take 1 tablet (25 mg total) by mouth 2 (two) times daily. 30 tablet 0  . Multiple Vitamin (MULTIVITAMIN WITH MINERALS) TABS tablet Take 1 tablet by mouth daily.    Marland Kitchen UNABLE TO FIND Med Name:Wheelchair Per medical necessity patient requires wheelchair to all perform daily activities. Pt is able to self propel chair. Unable to use can or walker due to balance 1 each 1  . fluconazole (DIFLUCAN) 100 MG tablet Take 1 tablet (100 mg total) by mouth daily.  10 tablet 0   No current facility-administered medications for this visit.    SURGICAL HISTORY:  Past Surgical History  Procedure Laterality Date  . Umbilical hernia repair  2010  . Hernia repair  2012  . Ventral hernia repair N/A 01/22/2013    Procedure: HERNIA REPAIR VENTRAL ADULT;  Surgeon: [name], MD;  Location: [facility];  Service: General;  Laterality: N/A;  . Application of a-cell of chest/abdomen N/A 01/22/2013    Procedure: APPLICATION OF A-CELL OF CHEST/ABDOMEN;  Surgeon: [name], MD;  Location: [facility];  Service: General;  Laterality: N/A;  . Cystoscopy N/A 01/22/2013    Procedure: Consuela Mimes;  Surgeon: [name], MD;  Location: [facility];  Service: Urology;  Laterality: N/A;  Cystoscopy with balloon dilation. Insertion of coude catheter.  . Craniotomy Right 04/19/2014    Procedure: CRANIOTOMY HEMATOMA EVACUATION SUBDURAL;  Surgeon: [name], MD;  Location: [facility];  Service: Neurosurgery;  Laterality: Right;  right  . Craniotomy Right 04/23/2014    Procedure: Craniotomy for Intracerebral Hemorrhage;  Surgeon: [name], MD;  Location: [facility];  Service: Neurosurgery;  Laterality: Right;  . 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 Coronary artery bypass graft  12/24/2008    4 vessel  . Coronary artery bypass graft  2012?  Marland Kitchen Craniotomy N/A 09/13/2014    Procedure: CRANIOTOMY BONE FLAP/PROSTHETIC PLATE;  Surgeon: [name], MD;  Location: [facility];  Service: Neurosurgery;  Laterality: N/A;  . Video bronchoscopy with endobronchial ultrasound N/A 11/09/2014    Procedure: VIDEO BRONCHOSCOPY WITH ENDOBRONCHIAL ULTRASOUND;  Surgeon: Collene Gobble, MD;  Location: [facility];  Service: Thoracic;  Laterality: N/A;    REVIEW OF SYSTEMS:  Constitutional: positive for fatigue and malaise Eyes: negative Ears,  nose, mouth, throat, and face: negative Respiratory: positive for dyspnea on exertion Cardiovascular: negative Gastrointestinal: positive for Episodes of bowel incontinence Genitourinary:positive for urinary incontinence Integument/breast: negative Hematologic/lymphatic: negative Musculoskeletal:negative Neurological: positive for speech problems and weakness Behavioral/Psych: negative Endocrine: negative Allergic/Immunologic: negative   PHYSICAL EXAMINATION: General appearance: alert, cooperative, fatigued and no distress Head: Normocephalic, without obvious abnormality, atraumatic Neck: no adenopathy, no JVD, supple, symmetrical, trachea midline and thyroid not enlarged, symmetric, no tenderness/mass/nodules Lymph nodes: Cervical, supraclavicular, and axillary nodes normal. Resp: clear to auscultation bilaterally Back: symmetric, no curvature. ROM normal. No CVA tenderness. Cardio: regular rate and rhythm, S1, S2 normal, no murmur, click, rub or gallop GI: soft, non-tender; bowel sounds normal; no masses,  no organomegaly Extremities: extremities normal, atraumatic, no cyanosis or edema Neurologic: Grossly normal Mouth: Reveals thrush  ECOG PERFORMANCE STATUS: 1 - Symptomatic but completely ambulatory  Blood pressure 95/57, pulse 77,  temperature 98.2 F (36.8 C), temperature source Oral, resp. rate 19, height '5\' 8"'$  (1.727 m), weight 238 lb 6.4 oz (108.138 kg), SpO2 95 %.  LABORATORY DATA: Lab Results  Component Value Date   WBC 5.7 05/09/2015   HGB 8.8* 05/09/2015   HCT 25.7* 05/09/2015   MCV 92.4 05/09/2015   PLT 151 05/09/2015      Chemistry      Component Value Date/Time   NA 138 05/09/2015 0941   NA 134* 05/01/2015 0530   K 3.9 05/09/2015 0941   K 3.5 05/01/2015 0530   CL 103 05/01/2015 0530   CO2 26 05/09/2015 0941   CO2 24 05/01/2015 0530   BUN 18.1 05/09/2015 0941   BUN 19 05/01/2015 0530   CREATININE 1.1 05/09/2015 0941   CREATININE 1.20 05/01/2015 0530     CREATININE 0.99 09/26/2012 1526      Component Value Date/Time   CALCIUM 9.2 05/09/2015 0941   CALCIUM 8.3* 05/01/2015 0530   ALKPHOS 91 05/09/2015 0941   ALKPHOS 68 09/23/2014 0922   AST 29 05/09/2015 0941   AST 24 09/23/2014 0922   ALT 29 05/09/2015 0941   ALT 18 09/23/2014 0922   BILITOT 0.56 05/09/2015 0941   BILITOT 0.4 09/23/2014 0922       RADIOGRAPHIC STUDIES: Dg Chest Port 1 View  04/29/2015   CLINICAL DATA:  Shortness of breath.  EXAM: PORTABLE CHEST - 1 VIEW  COMPARISON:  04/28/2015.  FINDINGS: Power port in stable position. Prior CABG. Stable cardiomegaly. Progressive diffuse right lung infiltrate. Mild left base infiltrate. No pneumothorax.  IMPRESSION: 1. Power port catheter stable position. 2. Persistent diffuse right lung infiltrate. Mild left base infiltrate. 3. Prior CABG.  Stable cardiomegaly.   Electronically Signed   By: [place]   On: 04/29/2015 07:52   Dg Chest Portable 1 View  04/28/2015   CLINICAL DATA:  Dizziness.  EXAM: PORTABLE CHEST - 1 VIEW  COMPARISON:  November 29, 2014.  FINDINGS: Stable cardiomediastinal silhouette. Sternotomy wires are noted. Visualized portion of left lung appears clear. Left lung base is not included in field-of-view. Increased airspace and interstitial opacity is noted in right lung concerning for pneumonia or edema. No definite pleural effusion or pneumothorax is noted. Right internal jugular Port-A-Cath is again noted and unchanged in position. Bony thorax is intact.  IMPRESSION: New right lung opacity is noted concerning for edema or pneumonia. Follow-up radiographs are recommended.   Electronically Signed   By: Marijo Conception, M.D.   On: 04/28/2015 13:26    ASSESSMENT AND PLAN: This is a very pleasant 69 year old white male with history of stage IIIa non-small cell lung cancer status post course of concurrent chemoradiation with weekly carboplatin and paclitaxel and tolerated his treatment fairly well except for the  fatigue from the anemia of neoplastic disease. His recent CT scan of the chest showed improvement in the right upper lobe lung mass as well as some of the mediastinal lymphadenopathy.   He is now receiving consolidation chemotherapy with 3 cycles of systemic chemotherapy with carboplatin for AUC of 5 and paclitaxel 175 MG/M2 every 3 weeks with Neulasta support. He is status post 2 cycles.  The patient was discussed with Dr. Marko Plume in Dr. Worthy Flank absence. We will postpone cycle #3 of his consolidation chemotherapy by one week. We will obtain an MRI of the brain with and without contrast as well as an MRI of the thoracic and lumbar spines to further evaluate his presenting  symptoms.  For the oral candidiasis a prescription for Diflucan was sent to his pharmacy of record via E scribed.  Follow-up in one week for reevaluation or sooner if needed.  He was advised to call immediately if he has any concerning symptoms in the interval.  The patient voices understanding of current disease status and treatment options and is in agreement with the current care plan.  All questions were answered. The patient knows to call the clinic with any problems, questions or concerns. We can certainly see the patient much sooner if necessary.   Disclaimer: This note was dictated with voice recognition software. Similar sounding words can inadvertently be transcribed and may be missed upon review.  Carlton Adam, PA-C 05/09/2015

## 2015-05-09 NOTE — Patient Instructions (Signed)

## 2015-05-09 NOTE — Telephone Encounter (Signed)
Gave adn printed appt sched adn avs fo rpt for June and July

## 2015-05-10 ENCOUNTER — Ambulatory Visit: Payer: BLUE CROSS/BLUE SHIELD

## 2015-05-12 ENCOUNTER — Encounter: Payer: Self-pay | Admitting: Internal Medicine

## 2015-05-12 ENCOUNTER — Ambulatory Visit (INDEPENDENT_AMBULATORY_CARE_PROVIDER_SITE_OTHER): Payer: BLUE CROSS/BLUE SHIELD | Admitting: Internal Medicine

## 2015-05-12 ENCOUNTER — Telehealth: Payer: Self-pay | Admitting: *Deleted

## 2015-05-12 ENCOUNTER — Telehealth: Payer: Self-pay | Admitting: Internal Medicine

## 2015-05-12 ENCOUNTER — Ambulatory Visit (HOSPITAL_BASED_OUTPATIENT_CLINIC_OR_DEPARTMENT_OTHER)
Admission: RE | Admit: 2015-05-12 | Discharge: 2015-05-12 | Disposition: A | Discharge status: Home / Self Care | Payer: BLUE CROSS/BLUE SHIELD | Source: Ambulatory Visit | Attending: Internal Medicine | Admitting: Internal Medicine

## 2015-05-12 VITALS — BP 124/68 | HR 60 | Temp 97.4°F | Ht 68.0 in | Wt 238.2 lb

## 2015-05-12 DIAGNOSIS — J90 Pleural effusion, not elsewhere classified: Secondary | ICD-10-CM | POA: Diagnosis not present

## 2015-05-12 DIAGNOSIS — C3411 Malignant neoplasm of upper lobe, right bronchus or lung: Secondary | ICD-10-CM | POA: Diagnosis not present

## 2015-05-12 DIAGNOSIS — J9811 Atelectasis: Secondary | ICD-10-CM | POA: Insufficient documentation

## 2015-05-12 DIAGNOSIS — J189 Pneumonia, unspecified organism: Secondary | ICD-10-CM

## 2015-05-12 DIAGNOSIS — I4891 Unspecified atrial fibrillation: Secondary | ICD-10-CM | POA: Diagnosis not present

## 2015-05-12 DIAGNOSIS — R05 Cough: Secondary | ICD-10-CM | POA: Diagnosis present

## 2015-05-12 NOTE — Telephone Encounter (Signed)
Just an FYI

## 2015-05-12 NOTE — Progress Notes (Signed)
Subjective:    Patient ID: Alejandro [name], male    DOB: 04-Jun-1946, 69 y.o.   MRN: [id number]  DOS:  05/12/2015 Type of visit - description : Hospital follow-up Interval history: Admitted to the hospital 04/28/2015 for 4 days Admitted with atrial fibrillation with RVR, pneumonia. cardiology was consulted, had some problems with hypotension in the outpatient, now on beta blockers and digoxin Had infiltrating the x-ray, received vancomycin and cefepime, blood cultures negative, influenza PCR negative, discharge on Augmentin for 3 days. CHF--- was discharged on regular doses of Lasix. Last BMP and LFTs satisfactory Last hemoglobin decrease (per oncology)  Since the hospital discharge, he was seen by oncology, they are planning for eval for lung cancer, he was treated for oral Candida infection     Review of Systems   since he left the hospital, is doing about the same. No actual fevers but yesterday a home health nurse thought he he had a low-grade temperature. Chest pain only with cough, difficulty breathing at baseline, occasionally has palpitations on mostly when he gets up and tries to walk. No nausea, vomiting, has diarrhea on and off, not a new problem. Continue with some cough, sputum is clear, no hemoptysis. I asked about anxiety and depression and certainly all his medical problems are a heavy burden  on him , has a very supportive family and wife    Past Medical History  Diagnosis Date  . Systolic heart failure   . 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 Dyslipidemia   . HTN (hypertension)   . Atrial fibrillation   . Tobacco abuse   . Alcohol abuse   . H/O hiatal hernia   . Myocardial infarction   . Dysrhythmia     atrial fib  . Stroke 5/15  . CHF (congestive heart failure)   . Shortness of breath     occ  . GERD (gastroesophageal reflux disease)   . S/P chemotherapy, time since 4-12 weeks   . Cancer     around nose.  New dx of lung cancer    . Non-small cell carcinoma of lung, stage 3 12/16/2014    Past Surgical History  Procedure Laterality Date  . Umbilical hernia repair  2010  . Hernia repair  2012  . Ventral hernia repair N/A 01/22/2013    Procedure: HERNIA REPAIR VENTRAL ADULT;  Surgeon: [name], MD;  Location: [facility];  Service: General;  Laterality: N/A;  . Application of a-cell of chest/abdomen N/A 01/22/2013    Procedure: APPLICATION OF A-CELL OF CHEST/ABDOMEN;  Surgeon: [name], MD;  Location: [facility];  Service: General;  Laterality: N/A;  . Cystoscopy N/A 01/22/2013    Procedure: Consuela Mimes;  Surgeon: [name], MD;  Location: [facility];  Service: Urology;  Laterality: N/A;  Cystoscopy with balloon dilation. Insertion of coude catheter.  . Craniotomy Right 04/19/2014    Procedure: CRANIOTOMY HEMATOMA EVACUATION SUBDURAL;  Surgeon: [name], MD;  Location: [facility];  Service: Neurosurgery;  Laterality: Right;  right  . Craniotomy Right 04/23/2014    Procedure: Craniotomy for Intracerebral Hemorrhage;  Surgeon: [name], MD;  Location: [facility];  Service: Neurosurgery;  Laterality: Right;  . 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 Coronary artery bypass graft  12/24/2008    4 vessel  . Coronary artery bypass graft  2012?  Marland Kitchen Craniotomy N/A 09/13/2014    Procedure: CRANIOTOMY BONE FLAP/PROSTHETIC PLATE;  Surgeon: [name], MD;  Location: [facility];  Service: Neurosurgery;  Laterality: N/A;  . Video bronchoscopy with endobronchial ultrasound N/A 11/09/2014    Procedure: VIDEO BRONCHOSCOPY WITH ENDOBRONCHIAL ULTRASOUND;  Surgeon: Collene Gobble, MD;  Location: [facility];  Service: Thoracic;  Laterality: N/A;    History   Social History  . Marital Status: Married    Spouse Name: N/A  . Number of Children: 2  . Years of Education: N/A    Occupational History  . [employer] - retired    Social History Main Topics  . Smoking status: Former Smoker -- 1.00 packs/day for 50 years    Types: Cigarettes    Quit date: 04/19/2013  . Smokeless tobacco: Never Used  . Alcohol Use: No     Comment: quit  . Drug Use: No  . Sexual Activity: Not on file   Other Topics Concern  . Not on file   Social History Narrative   Lives in [place], [place] with wife.        Medication List       This list is accurate as of: 05/12/15  3:59 PM.  Always use your most recent med list.               albuterol 108 (90 BASE) MCG/ACT inhaler  Commonly known as:  PROVENTIL HFA;VENTOLIN HFA  Inhale 2 puffs into the lungs every 4 (four) hours as needed for wheezing or shortness of breath.     aspirin 81 MG tablet  Take 81 mg by mouth daily.     digoxin 0.125 MG tablet  Commonly known as:  LANOXIN  Take 1 tablet (0.125 mg total) by mouth daily.     fluconazole 100 MG tablet  Commonly known as:  DIFLUCAN  Take 1 tablet (100 mg total) by mouth daily.     FLUoxetine 20 MG capsule  Commonly known as:  PROZAC  Take 1 capsule (20 mg total) by mouth daily.     furosemide 40 MG tablet  Commonly known as:  LASIX  Take 1 tablet (40 mg total) by mouth daily.     levETIRAcetam 500 MG tablet  Commonly known as:  KEPPRA  Take 1 tablet (500 mg total) by mouth 2 (two) times daily.     lidocaine-prilocaine cream  Commonly known as:  EMLA  Apply 1 application topically as needed.     metoprolol tartrate 25 MG tablet  Commonly known as:  LOPRESSOR  Take 1 tablet (25 mg total) by mouth 2 (two) times daily.     multivitamin with minerals Tabs tablet  Take 1 tablet by mouth daily.     UNABLE TO FIND  Med Name:Wheelchair Per medical necessity patient requires wheelchair to all perform daily activities. Pt is able to self propel chair. Unable to use can or walker due to balance           Objective:   Physical Exam BP  124/68 mmHg  Pulse 60  Temp(Src) 97.4 F (36.3 C) (Oral)  Ht '5\' 8"'$  (1.727 m)  Wt 238 lb 4 oz (108.069 kg)  BMI 36.23 kg/m2  SpO2 96% General:   Well developed,   NAD.   Lungs:  Decreased breath sounds otherwise clear,normal respiratory effort, no intercostal retractions, no accessory muscle use. Heart: RRR,  distant heart sounds  no pretibial edema bilaterally  Neurologic:  alert & oriented X3.  Speech normal, gait slow and slightly difficult, assisted by a walker  Psych--  Cognition and judgment appear intact.  Cooperative with normal attention span and concentration.  Behavior appropriate. No anxious or depressed appearing.       Assessment & Plan:     atrial fibrillation with RVR Heart rate today is 60, patient is essentially asymptomatic except for occasional palpitations when he gets up (could be related with anemia) Plan: Follow-up with cardiology as recommended, check a digoxin level  History of pneumonia, Blood cultures negative, status post abx, still coughing clear sputum. Plan: Check a chest x-ray  Lung cancer, Provided emotional support today, he knows to call me or ask for a counselor if he gets depressed.  Anemia, I think that accounts for most of his fatigue, follow-up by oncology

## 2015-05-12 NOTE — Telephone Encounter (Signed)
Caller name: Randell Patient with [facility] Can be reached: [phone]  Reason for call: Pt has not been feeling well recently and has refused physical therapy 2x this week. They are cancelling physical therapy at this time. [place] is still in the pt home and will notify if the pt is able to begin physical therapy.

## 2015-05-12 NOTE — Telephone Encounter (Signed)
Received home health certification and plan of care via fax from [facility]. Forwarded to DR. Paz. JG//CMA

## 2015-05-12 NOTE — Progress Notes (Signed)
Pre visit review using our clinic review tool, if applicable. No additional management support is needed unless otherwise documented below in the visit note. 

## 2015-05-12 NOTE — Patient Instructions (Signed)
Get your blood work before you leave   Stop by the first floor and get the XR

## 2015-05-13 LAB — DIGOXIN LEVEL: Digoxin Level: 1.3 ng/mL (ref 0.8–2.0)

## 2015-05-13 NOTE — Telephone Encounter (Signed)
thx

## 2015-05-16 ENCOUNTER — Other Ambulatory Visit (HOSPITAL_BASED_OUTPATIENT_CLINIC_OR_DEPARTMENT_OTHER): Payer: BLUE CROSS/BLUE SHIELD

## 2015-05-16 ENCOUNTER — Other Ambulatory Visit: Payer: Self-pay | Admitting: Internal Medicine

## 2015-05-16 ENCOUNTER — Ambulatory Visit: Payer: BLUE CROSS/BLUE SHIELD

## 2015-05-16 ENCOUNTER — Ambulatory Visit (HOSPITAL_BASED_OUTPATIENT_CLINIC_OR_DEPARTMENT_OTHER): Payer: BLUE CROSS/BLUE SHIELD

## 2015-05-16 VITALS — BP 127/62 | HR 77 | Temp 96.9°F

## 2015-05-16 DIAGNOSIS — Z95828 Presence of other vascular implants and grafts: Secondary | ICD-10-CM

## 2015-05-16 DIAGNOSIS — Z5111 Encounter for antineoplastic chemotherapy: Secondary | ICD-10-CM

## 2015-05-16 DIAGNOSIS — C3411 Malignant neoplasm of upper lobe, right bronchus or lung: Secondary | ICD-10-CM | POA: Diagnosis not present

## 2015-05-16 DIAGNOSIS — C3492 Malignant neoplasm of unspecified part of left bronchus or lung: Secondary | ICD-10-CM

## 2015-05-16 DIAGNOSIS — R32 Unspecified urinary incontinence: Secondary | ICD-10-CM

## 2015-05-16 DIAGNOSIS — J189 Pneumonia, unspecified organism: Secondary | ICD-10-CM | POA: Diagnosis not present

## 2015-05-16 DIAGNOSIS — C3490 Malignant neoplasm of unspecified part of unspecified bronchus or lung: Secondary | ICD-10-CM | POA: Diagnosis not present

## 2015-05-16 DIAGNOSIS — I5022 Chronic systolic (congestive) heart failure: Secondary | ICD-10-CM | POA: Diagnosis not present

## 2015-05-16 DIAGNOSIS — J449 Chronic obstructive pulmonary disease, unspecified: Secondary | ICD-10-CM | POA: Diagnosis not present

## 2015-05-16 LAB — CBC WITH DIFFERENTIAL/PLATELET
BASO%: 0.3 % (ref 0.0–2.0)
BASOS ABS: 0 10*3/uL (ref 0.0–0.1)
EOS%: 1.2 % (ref 0.0–7.0)
Eosinophils Absolute: 0.1 10*3/uL (ref 0.0–0.5)
HCT: 27.6 % — ABNORMAL LOW (ref 38.4–49.9)
HEMOGLOBIN: 9.4 g/dL — AB (ref 13.0–17.1)
LYMPH%: 8.7 % — ABNORMAL LOW (ref 14.0–49.0)
MCH: 32.1 pg (ref 27.2–33.4)
MCHC: 34.3 g/dL (ref 32.0–36.0)
MCV: 93.7 fL (ref 79.3–98.0)
MONO#: 0.7 10*3/uL (ref 0.1–0.9)
MONO%: 10.1 % (ref 0.0–14.0)
NEUT#: 5.5 10*3/uL (ref 1.5–6.5)
NEUT%: 79.7 % — ABNORMAL HIGH (ref 39.0–75.0)
Platelets: 265 10*3/uL (ref 140–400)
RBC: 2.94 10*6/uL — ABNORMAL LOW (ref 4.20–5.82)
RDW: 17.6 % — AB (ref 11.0–14.6)
WBC: 6.8 10*3/uL (ref 4.0–10.3)
lymph#: 0.6 10*3/uL — ABNORMAL LOW (ref 0.9–3.3)

## 2015-05-16 LAB — URINALYSIS, MICROSCOPIC - CHCC
BLOOD: NEGATIVE
Bilirubin (Urine): NEGATIVE
Glucose: NEGATIVE mg/dL
Ketones: NEGATIVE mg/dL
Nitrite: NEGATIVE
Protein: NEGATIVE mg/dL
SPECIFIC GRAVITY, URINE: 1.01 (ref 1.003–1.035)
Urobilinogen, UR: 0.2 mg/dL (ref 0.2–1)
pH: 6 (ref 4.6–8.0)

## 2015-05-16 LAB — COMPREHENSIVE METABOLIC PANEL (CC13)
ALK PHOS: 81 U/L (ref 40–150)
ALT: 29 U/L (ref 0–55)
AST: 28 U/L (ref 5–34)
Albumin: 2.9 g/dL — ABNORMAL LOW (ref 3.5–5.0)
Anion Gap: 11 mEq/L (ref 3–11)
BUN: 15 mg/dL (ref 7.0–26.0)
CO2: 24 mEq/L (ref 22–29)
Calcium: 8.8 mg/dL (ref 8.4–10.4)
Chloride: 101 mEq/L (ref 98–109)
Creatinine: 1.1 mg/dL (ref 0.7–1.3)
EGFR: 66 mL/min/{1.73_m2} — ABNORMAL LOW (ref >90)
Glucose: 150 mg/dl — ABNORMAL HIGH (ref 70–140)
Potassium: 3.7 mEq/L (ref 3.5–5.1)
Sodium: 136 mEq/L (ref 136–145)
Total Bilirubin: 0.58 mg/dL (ref 0.20–1.20)
Total Protein: 6.7 g/dL (ref 6.4–8.3)

## 2015-05-16 MED ORDER — SODIUM CHLORIDE 0.9 % IV SOLN
510.0000 mg | Freq: Once | INTRAVENOUS | Status: AC
  Administered 2015-05-16: 510 mg via INTRAVENOUS
  Filled 2015-05-16: qty 51

## 2015-05-16 MED ORDER — DIPHENHYDRAMINE HCL 50 MG/ML IJ SOLN
INTRAMUSCULAR | Status: AC
  Filled 2015-05-16: qty 1

## 2015-05-16 MED ORDER — FAMOTIDINE IN NACL 20-0.9 MG/50ML-% IV SOLN
20.0000 mg | Freq: Once | INTRAVENOUS | Status: AC
  Administered 2015-05-16: 20 mg via INTRAVENOUS

## 2015-05-16 MED ORDER — SODIUM CHLORIDE 0.9 % IJ SOLN
10.0000 mL | INTRAMUSCULAR | Status: DC | PRN
  Administered 2015-05-16: 10 mL via INTRAVENOUS
  Filled 2015-05-16: qty 10

## 2015-05-16 MED ORDER — SODIUM CHLORIDE 0.9 % IV SOLN
Freq: Once | INTRAVENOUS | Status: AC
  Administered 2015-05-16: 13:00:00 via INTRAVENOUS
  Filled 2015-05-16: qty 8

## 2015-05-16 MED ORDER — HEPARIN SOD (PORK) LOCK FLUSH 100 UNIT/ML IV SOLN
500.0000 [IU] | Freq: Once | INTRAVENOUS | Status: AC | PRN
  Administered 2015-05-16: 500 [IU]
  Filled 2015-05-16: qty 5

## 2015-05-16 MED ORDER — SODIUM CHLORIDE 0.9 % IV SOLN
250.0000 mL | Freq: Once | INTRAVENOUS | Status: AC
  Administered 2015-05-16: 250 mL via INTRAVENOUS

## 2015-05-16 MED ORDER — SODIUM CHLORIDE 0.9 % IJ SOLN
10.0000 mL | INTRAMUSCULAR | Status: DC | PRN
  Administered 2015-05-16: 10 mL
  Filled 2015-05-16: qty 10

## 2015-05-16 MED ORDER — FAMOTIDINE IN NACL 20-0.9 MG/50ML-% IV SOLN
INTRAVENOUS | Status: AC
  Filled 2015-05-16: qty 50

## 2015-05-16 MED ORDER — PACLITAXEL CHEMO INJECTION 300 MG/50ML
175.0000 mg/m2 | Freq: Once | INTRAVENOUS | Status: AC
  Administered 2015-05-16: 408 mg via INTRAVENOUS
  Filled 2015-05-16: qty 68

## 2015-05-16 MED ORDER — DIPHENHYDRAMINE HCL 50 MG/ML IJ SOLN
50.0000 mg | Freq: Once | INTRAMUSCULAR | Status: AC
  Administered 2015-05-16: 50 mg via INTRAVENOUS

## 2015-05-16 NOTE — Patient Instructions (Signed)
[facility] Discharge Instructions for Patients Receiving Chemotherapy  Today you received the following chemotherapy agents Taxol and Carboplatin.  To help prevent nausea and vomiting after your treatment, we encourage you to take your nausea medication as prescribed.   If you develop nausea and vomiting that is not controlled by your nausea medication, call the clinic.   BELOW ARE SYMPTOMS THAT SHOULD BE REPORTED IMMEDIATELY:  *FEVER GREATER THAN 100.5 F  *CHILLS WITH OR WITHOUT FEVER  NAUSEA AND VOMITING THAT IS NOT CONTROLLED WITH YOUR NAUSEA MEDICATION  *UNUSUAL SHORTNESS OF BREATH  *UNUSUAL BRUISING OR BLEEDING  TENDERNESS IN MOUTH AND THROAT WITH OR WITHOUT PRESENCE OF ULCERS  *URINARY PROBLEMS  *BOWEL PROBLEMS  UNUSUAL RASH Items with * indicate a potential emergency and should be followed up as soon as possible.  Feel free to call the clinic you have any questions or concerns. The clinic phone number is [phone].  Please show the CHEMO ALERT CARD at check-in to the Emergency Department and triage nurse.   

## 2015-05-16 NOTE — Patient Instructions (Signed)

## 2015-05-17 ENCOUNTER — Ambulatory Visit: Payer: BLUE CROSS/BLUE SHIELD

## 2015-05-17 LAB — URINE CULTURE

## 2015-05-18 ENCOUNTER — Ambulatory Visit (HOSPITAL_BASED_OUTPATIENT_CLINIC_OR_DEPARTMENT_OTHER): Payer: BLUE CROSS/BLUE SHIELD

## 2015-05-18 VITALS — BP 121/81 | HR 52 | Temp 97.6°F

## 2015-05-18 DIAGNOSIS — C3411 Malignant neoplasm of upper lobe, right bronchus or lung: Secondary | ICD-10-CM | POA: Diagnosis not present

## 2015-05-18 DIAGNOSIS — Z5189 Encounter for other specified aftercare: Secondary | ICD-10-CM

## 2015-05-18 MED ORDER — PEGFILGRASTIM INJECTION 6 MG/0.6ML ~~LOC~~
6.0000 mg | PREFILLED_SYRINGE | Freq: Once | SUBCUTANEOUS | Status: AC
  Administered 2015-05-18: 6 mg via SUBCUTANEOUS
  Filled 2015-05-18: qty 0.6

## 2015-05-18 NOTE — Telephone Encounter (Signed)
Faxed successfully. Sent for scanning.

## 2015-05-19 ENCOUNTER — Telehealth: Payer: Self-pay | Admitting: Internal Medicine

## 2015-05-19 NOTE — Telephone Encounter (Signed)
returned call adn s.w. pt wife adn confirmed appt.Marland KitchenMarland KitchenMarland KitchenMarland Kitchenpt ok and aware

## 2015-05-23 ENCOUNTER — Inpatient Hospital Stay (HOSPITAL_COMMUNITY)
Admission: EM | Admit: 2015-05-23 | Discharge: 2015-05-29 | DRG: 193 | Disposition: A | Discharge status: Home Health | Payer: Medicare Other | Attending: Internal Medicine | Admitting: Internal Medicine

## 2015-05-23 ENCOUNTER — Telehealth: Payer: Self-pay | Admitting: *Deleted

## 2015-05-23 ENCOUNTER — Encounter (HOSPITAL_COMMUNITY): Payer: Self-pay | Admitting: Emergency Medicine

## 2015-05-23 ENCOUNTER — Emergency Department (HOSPITAL_COMMUNITY): Payer: Medicare Other

## 2015-05-23 DIAGNOSIS — R32 Unspecified urinary incontinence: Secondary | ICD-10-CM

## 2015-05-23 DIAGNOSIS — I10 Essential (primary) hypertension: Secondary | ICD-10-CM | POA: Diagnosis present

## 2015-05-23 DIAGNOSIS — R5081 Fever presenting with conditions classified elsewhere: Secondary | ICD-10-CM | POA: Diagnosis present

## 2015-05-23 DIAGNOSIS — D6481 Anemia due to antineoplastic chemotherapy: Secondary | ICD-10-CM | POA: Diagnosis not present

## 2015-05-23 DIAGNOSIS — R159 Full incontinence of feces: Secondary | ICD-10-CM | POA: Diagnosis present

## 2015-05-23 DIAGNOSIS — Z951 Presence of aortocoronary bypass graft: Secondary | ICD-10-CM | POA: Diagnosis not present

## 2015-05-23 DIAGNOSIS — I5022 Chronic systolic (congestive) heart failure: Secondary | ICD-10-CM

## 2015-05-23 DIAGNOSIS — I252 Old myocardial infarction: Secondary | ICD-10-CM

## 2015-05-23 DIAGNOSIS — Z87891 Personal history of nicotine dependence: Secondary | ICD-10-CM | POA: Diagnosis not present

## 2015-05-23 DIAGNOSIS — C3411 Malignant neoplasm of upper lobe, right bronchus or lung: Secondary | ICD-10-CM | POA: Diagnosis not present

## 2015-05-23 DIAGNOSIS — Z923 Personal history of irradiation: Secondary | ICD-10-CM

## 2015-05-23 DIAGNOSIS — E876 Hypokalemia: Secondary | ICD-10-CM | POA: Diagnosis present

## 2015-05-23 DIAGNOSIS — N320 Bladder-neck obstruction: Secondary | ICD-10-CM | POA: Diagnosis present

## 2015-05-23 DIAGNOSIS — Z79899 Other long term (current) drug therapy: Secondary | ICD-10-CM | POA: Diagnosis not present

## 2015-05-23 DIAGNOSIS — Z85828 Personal history of other malignant neoplasm of skin: Secondary | ICD-10-CM | POA: Diagnosis not present

## 2015-05-23 DIAGNOSIS — B9620 Unspecified Escherichia coli [E. coli] as the cause of diseases classified elsewhere: Secondary | ICD-10-CM | POA: Diagnosis present

## 2015-05-23 DIAGNOSIS — J189 Pneumonia, unspecified organism: Principal | ICD-10-CM

## 2015-05-23 DIAGNOSIS — Z7982 Long term (current) use of aspirin: Secondary | ICD-10-CM

## 2015-05-23 DIAGNOSIS — I4821 Permanent atrial fibrillation: Secondary | ICD-10-CM | POA: Diagnosis present

## 2015-05-23 DIAGNOSIS — Z8673 Personal history of transient ischemic attack (TIA), and cerebral infarction without residual deficits: Secondary | ICD-10-CM

## 2015-05-23 DIAGNOSIS — B952 Enterococcus as the cause of diseases classified elsewhere: Secondary | ICD-10-CM | POA: Diagnosis present

## 2015-05-23 DIAGNOSIS — Z1621 Resistance to vancomycin: Secondary | ICD-10-CM | POA: Diagnosis present

## 2015-05-23 DIAGNOSIS — J449 Chronic obstructive pulmonary disease, unspecified: Secondary | ICD-10-CM | POA: Diagnosis present

## 2015-05-23 DIAGNOSIS — R51 Headache: Secondary | ICD-10-CM

## 2015-05-23 DIAGNOSIS — G629 Polyneuropathy, unspecified: Secondary | ICD-10-CM | POA: Diagnosis present

## 2015-05-23 DIAGNOSIS — I482 Chronic atrial fibrillation: Secondary | ICD-10-CM

## 2015-05-23 DIAGNOSIS — Y95 Nosocomial condition: Secondary | ICD-10-CM | POA: Diagnosis present

## 2015-05-23 DIAGNOSIS — R7881 Bacteremia: Secondary | ICD-10-CM | POA: Diagnosis not present

## 2015-05-23 DIAGNOSIS — D709 Neutropenia, unspecified: Secondary | ICD-10-CM

## 2015-05-23 DIAGNOSIS — D61818 Other pancytopenia: Secondary | ICD-10-CM

## 2015-05-23 DIAGNOSIS — M549 Dorsalgia, unspecified: Secondary | ICD-10-CM

## 2015-05-23 DIAGNOSIS — E785 Hyperlipidemia, unspecified: Secondary | ICD-10-CM | POA: Diagnosis not present

## 2015-05-23 DIAGNOSIS — C3490 Malignant neoplasm of unspecified part of unspecified bronchus or lung: Secondary | ICD-10-CM

## 2015-05-23 DIAGNOSIS — R569 Unspecified convulsions: Secondary | ICD-10-CM | POA: Diagnosis present

## 2015-05-23 DIAGNOSIS — T451X5A Adverse effect of antineoplastic and immunosuppressive drugs, initial encounter: Secondary | ICD-10-CM | POA: Diagnosis present

## 2015-05-23 DIAGNOSIS — D61810 Antineoplastic chemotherapy induced pancytopenia: Secondary | ICD-10-CM | POA: Diagnosis present

## 2015-05-23 DIAGNOSIS — I2510 Atherosclerotic heart disease of native coronary artery without angina pectoris: Secondary | ICD-10-CM | POA: Diagnosis not present

## 2015-05-23 DIAGNOSIS — K219 Gastro-esophageal reflux disease without esophagitis: Secondary | ICD-10-CM | POA: Diagnosis present

## 2015-05-23 DIAGNOSIS — R079 Chest pain, unspecified: Secondary | ICD-10-CM

## 2015-05-23 DIAGNOSIS — R519 Headache, unspecified: Secondary | ICD-10-CM

## 2015-05-23 DIAGNOSIS — Z8249 Family history of ischemic heart disease and other diseases of the circulatory system: Secondary | ICD-10-CM | POA: Diagnosis not present

## 2015-05-23 DIAGNOSIS — D701 Agranulocytosis secondary to cancer chemotherapy: Secondary | ICD-10-CM | POA: Diagnosis not present

## 2015-05-23 DIAGNOSIS — R404 Transient alteration of awareness: Secondary | ICD-10-CM | POA: Diagnosis not present

## 2015-05-23 DIAGNOSIS — R0600 Dyspnea, unspecified: Secondary | ICD-10-CM

## 2015-05-23 DIAGNOSIS — R531 Weakness: Secondary | ICD-10-CM | POA: Diagnosis not present

## 2015-05-23 LAB — CBC WITH DIFFERENTIAL/PLATELET
Basophils Absolute: 0 10*3/uL (ref 0.0–0.1)
Basophils Relative: 0 % (ref 0–1)
Eosinophils Absolute: 0 10*3/uL (ref 0.0–0.7)
Eosinophils Relative: 2 % (ref 0–5)
HEMATOCRIT: 25.6 % — AB (ref 39.0–52.0)
Hemoglobin: 8.8 g/dL — ABNORMAL LOW (ref 13.0–17.0)
LYMPHS PCT: 47 % — AB (ref 12–46)
Lymphs Abs: 0.3 10*3/uL — ABNORMAL LOW (ref 0.7–4.0)
MCH: 31.1 pg (ref 26.0–34.0)
MCHC: 34.4 g/dL (ref 30.0–36.0)
MCV: 90.5 fL (ref 78.0–100.0)
MONOS PCT: 27 % — AB (ref 3–12)
Monocytes Absolute: 0.2 10*3/uL (ref 0.1–1.0)
NEUTROS ABS: 0.2 10*3/uL — AB (ref 1.7–7.7)
Neutrophils Relative %: 24 % — ABNORMAL LOW (ref 43–77)
Platelets: 88 10*3/uL — ABNORMAL LOW (ref 150–400)
RBC: 2.83 MIL/uL — ABNORMAL LOW (ref 4.22–5.81)
RDW: 16.1 % — ABNORMAL HIGH (ref 11.5–15.5)
WBC: 0.7 10*3/uL — CL (ref 4.0–10.5)

## 2015-05-23 LAB — URINALYSIS, ROUTINE W REFLEX MICROSCOPIC
BILIRUBIN URINE: NEGATIVE
Glucose, UA: NEGATIVE mg/dL
Hgb urine dipstick: NEGATIVE
KETONES UR: NEGATIVE mg/dL
LEUKOCYTES UA: NEGATIVE
NITRITE: NEGATIVE
PROTEIN: NEGATIVE mg/dL
Specific Gravity, Urine: 1.013 (ref 1.005–1.030)
Urobilinogen, UA: 0.2 mg/dL (ref 0.0–1.0)
pH: 6.5 (ref 5.0–8.0)

## 2015-05-23 LAB — BASIC METABOLIC PANEL
Anion gap: 11 (ref 5–15)
BUN: 28 mg/dL — AB (ref 6–20)
CALCIUM: 9.1 mg/dL (ref 8.9–10.3)
CO2: 24 mmol/L (ref 22–32)
CREATININE: 1.31 mg/dL — AB (ref 0.61–1.24)
Chloride: 100 mmol/L — ABNORMAL LOW (ref 101–111)
GFR calc non Af Amer: 54 mL/min — ABNORMAL LOW (ref >60)
Glucose, Bld: 112 mg/dL — ABNORMAL HIGH (ref 65–99)
POTASSIUM: 3 mmol/L — AB (ref 3.5–5.1)
Sodium: 135 mmol/L (ref 135–145)

## 2015-05-23 LAB — HEPATIC FUNCTION PANEL
ALT: 39 U/L (ref 17–63)
AST: 29 U/L (ref 15–41)
Albumin: 3.2 g/dL — ABNORMAL LOW (ref 3.5–5.0)
Alkaline Phosphatase: 78 U/L (ref 38–126)
BILIRUBIN DIRECT: 0.5 mg/dL (ref 0.1–0.5)
BILIRUBIN INDIRECT: 1.5 mg/dL — AB (ref 0.3–0.9)
TOTAL PROTEIN: 6.4 g/dL — AB (ref 6.5–8.1)
Total Bilirubin: 2 mg/dL — ABNORMAL HIGH (ref 0.3–1.2)

## 2015-05-23 LAB — D-DIMER, QUANTITATIVE (NOT AT ARMC): D DIMER QUANT: 2.54 ug{FEU}/mL — AB (ref 0.00–0.48)

## 2015-05-23 LAB — BRAIN NATRIURETIC PEPTIDE: B NATRIURETIC PEPTIDE 5: 141.1 pg/mL — AB (ref 0.0–100.0)

## 2015-05-23 LAB — TROPONIN I: Troponin I: 0.07 ng/mL — ABNORMAL HIGH (ref <0.031)

## 2015-05-23 LAB — I-STAT TROPONIN, ED: Troponin i, poc: 0.06 ng/mL (ref 0.00–0.08)

## 2015-05-23 MED ORDER — IOHEXOL 350 MG/ML SOLN
100.0000 mL | Freq: Once | INTRAVENOUS | Status: AC | PRN
  Administered 2015-05-23: 80 mL via INTRAVENOUS

## 2015-05-23 MED ORDER — FLUCONAZOLE 100 MG PO TABS
100.0000 mg | ORAL_TABLET | Freq: Every day | ORAL | Status: DC
  Administered 2015-05-23 – 2015-05-29 (×7): 100 mg via ORAL
  Filled 2015-05-23 (×7): qty 1

## 2015-05-23 MED ORDER — ADULT MULTIVITAMIN W/MINERALS CH
1.0000 | ORAL_TABLET | Freq: Every day | ORAL | Status: DC
  Administered 2015-05-24 – 2015-05-29 (×6): 1 via ORAL
  Filled 2015-05-23 (×6): qty 1

## 2015-05-23 MED ORDER — VANCOMYCIN HCL 10 G IV SOLR
1250.0000 mg | INTRAVENOUS | Status: DC
  Administered 2015-05-24 – 2015-05-25 (×2): 1250 mg via INTRAVENOUS
  Filled 2015-05-23 (×3): qty 1250

## 2015-05-23 MED ORDER — POLYETHYLENE GLYCOL 3350 17 G PO PACK: 17.0000 g | PACK | Freq: Every day | ORAL | Status: DC | PRN

## 2015-05-23 MED ORDER — ONDANSETRON HCL 4 MG PO TABS: 4.0000 mg | ORAL_TABLET | Freq: Four times a day (QID) | ORAL | Status: DC | PRN

## 2015-05-23 MED ORDER — DIGOXIN 125 MCG PO TABS
0.0625 mg | ORAL_TABLET | Freq: Two times a day (BID) | ORAL | Status: DC
  Administered 2015-05-23 – 2015-05-29 (×12): 0.0625 mg via ORAL
  Filled 2015-05-23 (×12): qty 1

## 2015-05-23 MED ORDER — POTASSIUM CHLORIDE IN NACL 40-0.9 MEQ/L-% IV SOLN
INTRAVENOUS | Status: DC
  Administered 2015-05-23 – 2015-05-26 (×4): 75 mL/h via INTRAVENOUS
  Filled 2015-05-23 (×9): qty 1000

## 2015-05-23 MED ORDER — VANCOMYCIN HCL 10 G IV SOLR
2000.0000 mg | INTRAVENOUS | Status: AC
  Administered 2015-05-23: 2000 mg via INTRAVENOUS
  Filled 2015-05-23: qty 2000

## 2015-05-23 MED ORDER — SODIUM CHLORIDE 0.9 % IJ SOLN
10.0000 mL | INTRAMUSCULAR | Status: DC | PRN
  Administered 2015-05-23 – 2015-05-29 (×8): 10 mL
  Filled 2015-05-23 (×8): qty 40

## 2015-05-23 MED ORDER — ONDANSETRON HCL 4 MG/2ML IJ SOLN
4.0000 mg | Freq: Four times a day (QID) | INTRAMUSCULAR | Status: DC | PRN
Start: 2015-05-23 — End: 2015-05-29

## 2015-05-23 MED ORDER — VANCOMYCIN HCL 10 G IV SOLR: 1250.0000 mg | Freq: Two times a day (BID) | INTRAVENOUS | Status: DC

## 2015-05-23 MED ORDER — PIPERACILLIN-TAZOBACTAM 3.375 G IVPB
3.3750 g | Freq: Once | INTRAVENOUS | Status: AC
  Administered 2015-05-23: 3.375 g via INTRAVENOUS
  Filled 2015-05-23: qty 50

## 2015-05-23 MED ORDER — ACETAMINOPHEN 650 MG RE SUPP: 650.0000 mg | Freq: Four times a day (QID) | RECTAL | Status: DC | PRN

## 2015-05-23 MED ORDER — ALUM & MAG HYDROXIDE-SIMETH 200-200-20 MG/5ML PO SUSP: 30.0000 mL | Freq: Four times a day (QID) | ORAL | Status: DC | PRN

## 2015-05-23 MED ORDER — FLUOXETINE HCL 20 MG PO CAPS
20.0000 mg | ORAL_CAPSULE | Freq: Every day | ORAL | Status: DC
  Administered 2015-05-23 – 2015-05-29 (×7): 20 mg via ORAL
  Filled 2015-05-23 (×7): qty 1

## 2015-05-23 MED ORDER — OXYCODONE HCL 5 MG PO TABS
5.0000 mg | ORAL_TABLET | ORAL | Status: DC | PRN
  Administered 2015-05-24: 5 mg via ORAL
  Filled 2015-05-23: qty 1

## 2015-05-23 MED ORDER — ALBUTEROL SULFATE (2.5 MG/3ML) 0.083% IN NEBU: 3.0000 mL | INHALATION_SOLUTION | RESPIRATORY_TRACT | Status: DC | PRN

## 2015-05-23 MED ORDER — PIPERACILLIN-TAZOBACTAM 3.375 G IVPB
3.3750 g | Freq: Three times a day (TID) | INTRAVENOUS | Status: DC
  Administered 2015-05-24 – 2015-05-26 (×8): 3.375 g via INTRAVENOUS
  Filled 2015-05-23 (×9): qty 50

## 2015-05-23 MED ORDER — ASPIRIN 81 MG PO CHEW
81.0000 mg | CHEWABLE_TABLET | Freq: Every day | ORAL | Status: DC
  Administered 2015-05-24 – 2015-05-29 (×6): 81 mg via ORAL
  Filled 2015-05-23 (×6): qty 1

## 2015-05-23 MED ORDER — METOPROLOL TARTRATE 25 MG PO TABS
25.0000 mg | ORAL_TABLET | Freq: Two times a day (BID) | ORAL | Status: DC
  Administered 2015-05-23 – 2015-05-25 (×4): 25 mg via ORAL
  Filled 2015-05-23 (×4): qty 1

## 2015-05-23 MED ORDER — ACETAMINOPHEN 325 MG PO TABS: 650.0000 mg | ORAL_TABLET | Freq: Four times a day (QID) | ORAL | Status: DC | PRN

## 2015-05-23 MED ORDER — SODIUM CHLORIDE 0.9 % IJ SOLN: 3.0000 mL | Freq: Two times a day (BID) | INTRAMUSCULAR | Status: DC

## 2015-05-23 MED ORDER — LEVETIRACETAM 500 MG PO TABS
500.0000 mg | ORAL_TABLET | Freq: Two times a day (BID) | ORAL | Status: DC
  Administered 2015-05-23 – 2015-05-29 (×12): 500 mg via ORAL
  Filled 2015-05-23 (×12): qty 1

## 2015-05-23 MED ORDER — GUAIFENESIN-DM 100-10 MG/5ML PO SYRP: 5.0000 mL | ORAL_SOLUTION | ORAL | Status: DC | PRN

## 2015-05-23 MED ORDER — POTASSIUM CHLORIDE CRYS ER 20 MEQ PO TBCR
40.0000 meq | EXTENDED_RELEASE_TABLET | Freq: Once | ORAL | Status: AC
  Administered 2015-05-23: 40 meq via ORAL
  Filled 2015-05-23: qty 2

## 2015-05-23 MED ORDER — SODIUM CHLORIDE 0.9 % IV BOLUS (SEPSIS)
500.0000 mL | Freq: Once | INTRAVENOUS | Status: AC
  Administered 2015-05-23: 500 mL via INTRAVENOUS

## 2015-05-23 NOTE — Progress Notes (Signed)
Utilization Review completed.  Lyllie Cobbins RN CM  

## 2015-05-23 NOTE — ED Notes (Signed)
Pt brought in by [facility] for bowel incontinence, weakness-having to use his walker more, and shob.  Note found from today:   TC from pt's wife stating that her husband's condition has been changing/declining over the last few days. He has had more bladder and bowel incontinence, increased weakness as evidenced by needing to use walker and wheelchair more, not being able to walk as far-barely able to get down hallway. Also his wife states that she believes he had a seizure last night-stiffened arms and legs, fixed gaze that lasted <5-10 secs. She states that he was 'fuzzy headed' afterwords.. She states he is slurring his words some and just over all not doing very well. He is scheduled for MRI of brain and back tomorrow.  Discussed siutation with Selena Lesser, NP in [facility] and agreed that pt needs to go to [facility] asap for evaluation/scans etc. Selena Lesser, NP to inform Dr. Julien Nordmann of situation.   Call back to Mrs. Seubert and instructed her to call 911 for transport to ED. She was very agreeable to that-stated he just had another episode of bowel incontinence

## 2015-05-23 NOTE — ED Notes (Signed)
Went in to collect labs and patient does not want to be stuck he wants his port access.  I made the nurse aware.

## 2015-05-23 NOTE — ED Provider Notes (Signed)
Faythe Ghee CSN: [id number]     Arrival date & time 05/23/15  1116 History   First MD Initiated Contact with Patient 05/23/15 1140     Chief Complaint  Patient presents with  . Shortness of Breath  . Encopresis  . Diarrhea     (Consider location/radiation/quality/duration/timing/severity/associated sxs/prior Treatment) HPI Comments: Pt is a 69 y.o. male with Pmhx as above including coronary artery disease with a history of a 4 vessel CABG stage III.  Lung cancer undergoing chemotherapy, who presents with multiple complaints including 2-3 days DOE, 1-2wks  of bowel/bladder incontinence intermittent complaints of low back pain, thoracic back/lumbar back pain, headache, confusion. He was scheduled to have a MR brain and thoracic and lumbar spine to look for extension of his lung cancer tomorrow and also an upcoming CT of his chest, abdomen.  Patient also reports that at about 11:00 he had about a 5-10 minute episode of central chest pressure beginning at rest without radiation.  He has no chest pain currently.  He denies fevers or chills.  His wife states that his history is not accurate and that he often downplays symptoms.    Past Medical History  Diagnosis Date  . Systolic heart failure   . 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 Dyslipidemia   . HTN (hypertension)   . Atrial fibrillation   . Tobacco abuse   . Alcohol abuse   . H/O hiatal hernia   . Myocardial infarction   . Dysrhythmia     atrial fib  . Stroke 5/15  . CHF (congestive heart failure)   . Shortness of breath     occ  . GERD (gastroesophageal reflux disease)   . S/P chemotherapy, time since 4-12 weeks   . Cancer     around nose.  New dx of lung cancer  . Non-small cell carcinoma of lung, stage 3 12/16/2014   Past Surgical History  Procedure Laterality Date  . Umbilical hernia repair  2010  . Hernia repair  2012  . Ventral hernia repair N/A 01/22/2013    Procedure: HERNIA REPAIR VENTRAL ADULT;   Surgeon: [name], MD;  Location: [facility];  Service: General;  Laterality: N/A;  . Application of a-cell of chest/abdomen N/A 01/22/2013    Procedure: APPLICATION OF A-CELL OF CHEST/ABDOMEN;  Surgeon: [name], MD;  Location: [facility];  Service: General;  Laterality: N/A;  . Cystoscopy N/A 01/22/2013    Procedure: Consuela Mimes;  Surgeon: [name], MD;  Location: [facility];  Service: Urology;  Laterality: N/A;  Cystoscopy with balloon dilation. Insertion of coude catheter.  . Craniotomy Right 04/19/2014    Procedure: CRANIOTOMY HEMATOMA EVACUATION SUBDURAL;  Surgeon: [name], MD;  Location: [facility];  Service: Neurosurgery;  Laterality: Right;  right  . Craniotomy Right 04/23/2014    Procedure: Craniotomy for Intracerebral Hemorrhage;  Surgeon: [name], MD;  Location: [place] [facility];  Service: Neurosurgery;  Laterality: Right;  . 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 Coronary artery bypass graft  12/24/2008    4 vessel  . Coronary artery bypass graft  2012?  Marland Kitchen Craniotomy N/A 09/13/2014    Procedure: CRANIOTOMY BONE FLAP/PROSTHETIC PLATE;  Surgeon: [name], MD;  Location: [place];  Service: Neurosurgery;  Laterality:  N/A;  . Video bronchoscopy with endobronchial ultrasound N/A 11/09/2014    Procedure: VIDEO BRONCHOSCOPY WITH ENDOBRONCHIAL ULTRASOUND;  Surgeon: Collene Gobble, MD;  Location: [facility];  Service: Thoracic;  Laterality: N/A;   Family History  Problem Relation Age of Onset  . Diabetes Mother   . Heart disease Mother   . Diabetes Father   . Heart disease Father   . Heart disease Brother     s/p CABG at 58  . Colon cancer Neg Hx   . Prostate cancer Neg Hx   . Rheum arthritis Daughter   . Heart attack Mother   . Heart attack Father   . Heart attack Brother   . Stroke Neg Hx    History  Substance Use  Topics  . Smoking status: Former Smoker -- 1.00 packs/day for 50 years    Types: Cigarettes    Quit date: 04/19/2013  . Smokeless tobacco: Never Used  . Alcohol Use: No     Comment: quit    Review of Systems  Constitutional: Negative for fever, activity change, appetite change and fatigue.  HENT: Negative for congestion, facial swelling, rhinorrhea and trouble swallowing.   Eyes: Negative for photophobia and pain.  Respiratory: Positive for shortness of breath. Negative for cough and chest tightness.   Cardiovascular: Positive for chest pain. Negative for leg swelling.  Gastrointestinal: Negative for nausea, vomiting, abdominal pain, diarrhea and constipation.  Endocrine: Negative for polydipsia and polyuria.  Genitourinary: Negative for dysuria, urgency, decreased urine volume and difficulty urinating.  Musculoskeletal: Negative for back pain and gait problem.  Skin: Negative for color change, rash and wound.  Allergic/Immunologic: Negative for immunocompromised state.  Neurological: Positive for weakness. Negative for dizziness, facial asymmetry, speech difficulty, numbness and headaches.  Psychiatric/Behavioral: Negative for confusion, decreased concentration and agitation.      Allergies  Review of patient's allergies indicates no known allergies.  Home Medications   Prior to Admission medications   Medication Sig Start Date End Date Taking? Authorizing Provider  albuterol (PROVENTIL HFA;VENTOLIN HFA) 108 (90 BASE) MCG/ACT inhaler Inhale 2 puffs into the lungs every 4 (four) hours as needed for wheezing or shortness of breath. 03/22/15  Yes Collene Gobble, MD  aspirin 81 MG tablet Take 81 mg by mouth daily.   Yes Historical Provider, MD  digoxin (LANOXIN) 0.125 MG tablet Take 1 tablet (0.125 mg total) by mouth daily. Patient taking differently: Take 0.0625 mg by mouth 2 (two) times daily.  05/02/15  Yes Belkys A Regalado, MD  FLUoxetine (PROZAC) 20 MG capsule Take 1 capsule  (20 mg total) by mouth daily. 04/15/15  Yes Colon Branch, MD  furosemide (LASIX) 40 MG tablet Take 1 tablet (40 mg total) by mouth daily. 05/02/15  Yes Belkys A Regalado, MD  levETIRAcetam (KEPPRA) 500 MG tablet Take 1 tablet (500 mg total) by mouth 2 (two) times daily. 04/15/15  Yes Colon Branch, MD  lidocaine-prilocaine (EMLA) cream Apply 1 application topically as needed. Patient taking differently: Apply 1 application topically as needed (pain).  03/28/15  Yes Maryanna Shape, NP  metoprolol (LOPRESSOR) 50 MG tablet Take 25 mg by mouth 2 (two) times daily.   Yes Historical Provider, MD  Multiple Vitamin (MULTIVITAMIN WITH MINERALS) TABS tablet Take 1 tablet by mouth daily. 05/14/14  Yes Daniel J Edgar, PA-C  UNABLE TO FIND Med Name:Wheelchair Per medical necessity patient requires wheelchair to all perform daily activities. Pt is able to self propel chair. Unable to use can  or walker due to balance 04/26/15  Yes Susanne Borders, NP  fluconazole (DIFLUCAN) 100 MG tablet Take 1 tablet (100 mg total) by mouth daily. Patient not taking: Reported on 05/23/2015 05/09/15   Carlton Adam, PA-C  metoprolol tartrate (LOPRESSOR) 25 MG tablet Take 1 tablet (25 mg total) by mouth 2 (two) times daily. Patient not taking: Reported on 05/23/2015 05/02/15   Belkys A Regalado, MD   BP 149/77 mmHg  Pulse 87  Temp(Src) 98.7 F (37.1 C) (Oral)  Resp 16  SpO2 100% Physical Exam  Constitutional: He is oriented to person, place, and time. He appears well-developed and well-nourished. No distress.  HENT:  Head: Normocephalic and atraumatic.  Mouth/Throat: No oropharyngeal exudate.  Eyes: Pupils are equal, round, and reactive to light.  Neck: Normal range of motion. Neck supple.  Cardiovascular: Normal rate, regular rhythm and normal heart sounds.  Exam reveals no gallop and no friction rub.   No murmur heard. Pulmonary/Chest: Effort normal and breath sounds normal. No respiratory distress. He has no wheezes. He  has no rales.  Abdominal: Soft. Bowel sounds are normal. He exhibits no distension and no mass. There is no tenderness. There is no rebound and no guarding.  Genitourinary:  Decreased rectal tone  Musculoskeletal: Normal range of motion. He exhibits no edema or tenderness.  Neurological: He is alert and oriented to person, place, and time. He displays no atrophy and no tremor. No cranial nerve deficit or sensory deficit. He exhibits normal muscle tone. Coordination normal. GCS eye subscore is 4. GCS verbal subscore is 4. GCS motor subscore is 6.  Reflex Scores:      Patellar reflexes are 0 on the right side and 0 on the left side. Supportive 5 strength bilateral lower extremities  Skin: Skin is warm and dry.  Psychiatric: He has a normal mood and affect.    ED Course  Procedures (including critical care time) Labs Review Labs Reviewed  CBC WITH DIFFERENTIAL/PLATELET - Abnormal; Notable for the following:    WBC 0.7 (*)    RBC 2.83 (*)    Hemoglobin 8.8 (*)    HCT 25.6 (*)    RDW 16.1 (*)    Platelets 88 (*)    Neutrophils Relative % 24 (*)    Lymphocytes Relative 47 (*)    Monocytes Relative 27 (*)    Neutro Abs 0.2 (*)    Lymphs Abs 0.3 (*)    All other components within normal limits  BASIC METABOLIC PANEL - Abnormal; Notable for the following:    Potassium 3.0 (*)    Chloride 100 (*)    Glucose, Bld 112 (*)    BUN 28 (*)    Creatinine, Ser 1.31 (*)    GFR calc non Af Amer 54 (*)    All other components within normal limits  BRAIN NATRIURETIC PEPTIDE - Abnormal; Notable for the following:    B Natriuretic Peptide 141.1 (*)    All other components within normal limits  D-DIMER, QUANTITATIVE (NOT AT [facility]) - Abnormal; Notable for the following:    D-Dimer, Quant 2.54 (*)    All other components within normal limits  URINE CULTURE  CULTURE, BLOOD (ROUTINE X 2)  CULTURE, BLOOD (ROUTINE X 2)  URINALYSIS, ROUTINE W REFLEX MICROSCOPIC (NOT AT [facility])  Randolm Idol, ED     Imaging Review Dg Chest 2 View  05/23/2015   CLINICAL DATA:  Short of breath. Diarrhea. RIGHT-sided lung cancer. Left-sided chest pain. Weakness.  EXAM:  CHEST  2 VIEW  COMPARISON:  Multiple priors dating back to 09/07/2014.  FINDINGS: Cardiopericardial silhouette enlarged. Median sternotomy. Monitoring leads project over the chest. RIGHT IJ power port with the tip in the lower superior vena cava.  There is mild apical lordotic projection on today's chest radiograph. When compared to 05/11/2014, the opacity in the RIGHT mid lung continues to be more prominent than on prior chest radiographs, suggesting progression of tumor in the RIGHT lung. This could also represent radiation changes if patient is undergoing radiation therapy.  IMPRESSION: Short term stability of the RIGHT chest nodule with surrounding airspace opacity. No definite acute cardiopulmonary disease on today's chest radiograph. Chronic cardiomegaly without failure.   Electronically Signed   By: Dereck Ligas M.D.   On: 05/23/2015 13:44   Ct Head Wo Contrast  05/23/2015   CLINICAL DATA:  Shortness of breath and chest pain. Increasing weakness. History of brain surgery.  EXAM: CT HEAD WITHOUT CONTRAST  TECHNIQUE: Contiguous axial images were obtained from the base of the skull through the vertex without intravenous contrast.  COMPARISON:  06/01/2014 head CT  FINDINGS: Skull and Sinuses:A right craniotomy bone flap has been replaced. The operative site is unremarkable.  No sinus or mastoid effusion.  Orbits: No acute abnormality.  Brain: No evidence of acute infarction, hemorrhage, hydrocephalus, or mass lesion/mass effect.  Expected evolution of low-density in the right temporal lobe, now with circumscribed margins and negative mass effect, consistent with gliosis. A parenchymal hematoma was noted in this region on 2015 imaging.  Stable pattern of extensive chronic small vessel disease with patchy ischemic gliosis throughout the bilateral  cerebral white matter. There have been numerous remote bilateral cerebellar infarcts.  IMPRESSION: 1. No acute findings. 2. 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 3. Posttraumatic right temporal lobe gliosis.   Electronically Signed   By: Monte Fantasia M.D.   On: 05/23/2015 14:00   Ct Angio Chest Pe W/cm &/or Wo Cm  05/23/2015   CLINICAL DATA:  Left-sided chest pain and short of breath. Elevated D-dimer. Non-small cell lung cancer diagnosis January 2016. Chemotherapy in progress. Radiation therapy complete.  EXAM: CT ANGIOGRAPHY CHEST WITH CONTRAST  TECHNIQUE: Multidetector CT imaging of the chest was performed using the standard protocol during bolus administration of intravenous contrast. Multiplanar CT image reconstructions and MIPs were obtained to evaluate the vascular anatomy.  CONTRAST:  68m OMNIPAQUE IOHEXOL 350 MG/ML SOLN  COMPARISON:  Chest CT 03/11/2015  FINDINGS: Mediastinum/Nodes: No filling defects within the pulmonary arteries to suggest acute pulmonary embolism. No acute findings of the aorta great vessels.  No mediastinal lymphadenopathy. Right lower paratracheal lymph node measures 10 mm short axis decreased from 19 on prior. No pericardial fluid. Post CABG anatomy  Lungs/Pleura: There is patchy foci of could type consolidation with air bronchograms in the right upper lobe, right middle lobe and superior segment of the right lower lobe. There is central para bronchovascular thickening extending from the right hilum. Left lung is clear.  Upper abdomen: Limited view of the liver, kidneys, pancreas are unremarkable. Normal adrenal glands.  Musculoskeletal: No acute osseous abnormality.  Review of the MIP images confirms the above findings.  IMPRESSION: 1. New patchy consolidation in the right upper lobe, right middle lobe and superior right lower with differential including multifocal pneumonia, aspiration pneumonitis, or delayed postradiation  change. Lung cancer recurrence is not favored. Unilateral drug reaction would also be unlikely. 2. No evidence of acute pulmonary embolism.  Electronically Signed   By: Suzy Bouchard M.D.   On: 06-10-2015 15:55     EKG Interpretation   Date/Time:  June 10, 2015 11:31:15 EDT Ventricular Rate:  85 PR Interval:    QRS Duration: 123 QT Interval:  360 QTC Calculation: 428 R Axis:   -160 Text Interpretation:  Atrial fibrillation Ventricular premature complex  Nonspecific intraventricular conduction delay ST depression throughout  Mild STE I, aVL Lateral infarct, recent Reconfirmed by DOCHERTY  MD, MEGAN  [phone]) on 06-10-2015 12:59:10 PM      MDM   Final diagnoses:  Dyspnea  Chest pain  Back pain  Lung cancer  Bowel and bladder incontinence  Healthcare-associated pneumonia  Neutropenia    Pt is a 69 y.o. male with Pmhx as above including coronary artery disease with a history of a 4 vessel CABG stage III.  Lung cancer undergoing chemotherapy, who presents with multiple complaints including 2-3 days DOE, 1-2wks  of bowel/bladder incontinence intermittent complaints of low back pain, thoracic back/lumbar back pain, headache, confusion.  Pt also had about 5-10 mins of central chest pressure that around 11am that resolved by time of my exam.  EKG with new ST elevations in lead 1 and aVL with depressions in leads 3, aVF, V4, V5 V6.  On neuro exam, patient is awake and alert, though somewhat confused.  He has no focal neuro findings.  Strength is symmetrically decreased in bilateral lower extremities, cannot get good patellar reflexes.  He has some decreased sensation in the groin, decreased rectal tone, and fecal incontinence on exam.     Plan for MR brain T and L-spine as planned for tomorrow, cord compression/cauda equina, however, given onset of chest pain and shortness of breath and active cancer, I also have to consider PE.  I spoke with Dr. Ellyn Hack of cardiology who reviewed  EKG and agreed that there were some acute concerning changes.  However, at this point.  Workup in with no active chest pain would not make it code STEMI.  First troponin is negative.  CT head is normal . CTA chest shows multifocal pneumonia.  Patient also found to be neutropenic, hypokalemic.  I spoke with [facility]ad, who will admit.  She is still pending her MR studies. Ernestina Patches, MD 10-Jun-2015 [phone]

## 2015-05-23 NOTE — ED Notes (Signed)
Made Dr Tawnya Crook aware that Dr Radford Pax called with Cardiology and stated that Hospitalist will need to admit pt but they will follow his care.   MRI also called stating that he is on their to do list but wont be able to get to pt until 6pm.

## 2015-05-23 NOTE — ED Notes (Signed)
Bed: WA07 Expected date:  Expected time:  Means of arrival:  Comments: EMS-lung [facility]

## 2015-05-23 NOTE — Progress Notes (Addendum)
ANTIBIOTIC CONSULT NOTE - INITIAL  Pharmacy Consult for vancomycin/zosyn Indication: rule out pneumonia  No Known Allergies  Patient Measurements:   Adjusted Body Weight:   Vital Signs: Temp: 98.7 F (37.1 C) (06/20 1124) Temp Source: Oral (06/20 1124) BP: 149/77 mmHg (06/20 1124) Pulse Rate: 87 (06/20 1124) Intake/Output from previous day:   Intake/Output from this shift:    Labs:  Recent Labs  05/23/15 1313  WBC 0.7*  HGB 8.8*  PLT 88*  CREATININE 1.31*   Estimated Creatinine Clearance: 63.5 mL/min (by C-G formula based on Cr of 1.31). No results for input(s): VANCOTROUGH, VANCOPEAK, VANCORANDOM, GENTTROUGH, GENTPEAK, GENTRANDOM, TOBRATROUGH, TOBRAPEAK, TOBRARND, AMIKACINPEAK, AMIKACINTROU, AMIKACIN in the last 72 hours.   Microbiology: Recent Results (from the past 720 hour(s))  MRSA PCR Screening     Status: None   Collection Time: 04/28/15  5:38 PM  Result Value Ref Range Status   MRSA by PCR NEGATIVE NEGATIVE Final    Comment:        The GeneXpert MRSA Assay (FDA approved for NASAL specimens only), is one component of a comprehensive MRSA colonization surveillance program. It is not intended to diagnose MRSA infection nor to guide or monitor treatment for MRSA infections.   Culture, blood (routine x 2) Call MD if unable to obtain prior to antibiotics being given     Status: None   Collection Time: 04/28/15  8:46 PM  Result Value Ref Range Status   Specimen Description BLOOD RIGHT HAND  Final   Special Requests BOTTLES DRAWN AEROBIC ONLY 10CC  Final   Culture   Final    NO GROWTH 5 DAYS Note: Culture results may be compromised due to an excessive volume of blood received in culture bottles. Performed at [facility]    Report Status 05/05/2015 FINAL  Final  Culture, blood (routine x 2) Call MD if unable to obtain prior to antibiotics being given     Status: None   Collection Time: 04/28/15  9:00 PM  Result Value Ref Range Status    Specimen Description BLOOD RIGHT HAND  Final   Special Requests BOTTLES DRAWN AEROBIC AND ANAEROBIC 5CC  Final   Culture   Final    NO GROWTH 5 DAYS Performed at [facility]    Report Status 05/05/2015 FINAL  Final  Urine Culture     Status: None   Collection Time: 05/16/15 11:Alejandro AM  Result Value Ref Range Status   Urine Culture, Routine Culture, Urine  Final    Comment: ------------------------------------------------------------------------ ITEMS WERE ATTACHED TO THIS ORDER: CULTURE, URINE Final - ===== COLONY COUNT: ===== 6,000 COLONIES/ML Insignificant Growth     Medical History: Past Medical History  Diagnosis Date  . Systolic heart failure   . 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 Dyslipidemia   . HTN (hypertension)   . Atrial fibrillation   . Tobacco abuse   . Alcohol abuse   . H/O hiatal hernia   . Myocardial infarction   . Dysrhythmia     atrial fib  . Stroke 5/15  . CHF (congestive heart failure)   . Shortness of breath     occ  . GERD (gastroesophageal reflux disease)   . S/P chemotherapy, time since 4-12 weeks   . Cancer     around nose.  New dx of lung cancer  . Non-small cell carcinoma of lung, stage 3 12/16/2014   Assessment: Alejandro [name] presents with SOB. Labs reveal  neutropenia at time of admission (neulasta given 6/15), he is undergoing chemo for lung cancer. Per CTA 6/20 new patchy consolidation in the right upper lobe, right middle lobe and superior right lower with differential including multifocal pneumonia, aspiration pneumonitis, or delayed postradiation change. Pharmacy asked to dose   6/20 >> vancomycin  >> 6/20 >> zosyn x1    6/20 blood: 6/20 urine:  / sputum:   WBC - neutropenia (ANC = 200) SCr mildly elevated, Norm CrCl = 55m/min  Dose changes/levels:  Goal of Therapy:  Vancomycin trough level 15-20 mcg/ml  Plan:   Vancomycin 2gm IV x1 then '1250mg'$  IV q24h  Trough if remains on vancomycin  Zosyn  3.375gm IV q8h over 4h infusion  DDoreene Eland PharmD, BCPS.   Pager: [phone] 05/23/2015,4:38 PM

## 2015-05-23 NOTE — ED Notes (Signed)
Pt can go to floor at 17:35

## 2015-05-23 NOTE — Consult Note (Signed)
Admit date: 05/23/2015 Referring Physician  Dr. Tawnya Crook Primary Physician  Dr. [name] Primary Cardiologist  Dr. [name] Reason for Consultation  Abnormal EKG, altered mental status  HPI: This is a 69yo male with a historyh of ASCAD s/p remote CABG x 4 12/2008, hyperlipidemia, chronic systolic CHF with EF 16-10% by echo 04/2015, HTN, stage 3 non-small cell CA of lung s/p chemo, atrial fibrillation not on anticoagulation 2/2 chemo induced pancytopenia, traumatic SHD s/p craniotomy who was just in hospital last month with afib with RVR and HCAP.  He was treated with antibiotics.  He was started on dig and metoprolol for rate control.  His wife called in today stating that he his condition had worsened over the past few days.  He has been having more bowel and bladder incontinence and increased weakness needing to use his walker and wheelchair.  His wife thinks he had a seizure last night.  Apparently she witnessed his arms and legs stiffen and his gaze was fixed for about 10 seconds.  Afterwards he seemed "fuzzy".  She says that he has been slurring his words.  His wife says that he also has had 2-3 days of DOE, headache and back pain.  He also had 5-10 minutes of central chest pain this am around 11am that resolved by the time he was seen by the ER MD.  Initial EKG showed < 40m of ST segment elevation in I And aVL and ST depression in the anterior leads.  Cardiology is now asked to assess.     PMH:   Past Medical History  Diagnosis Date  . Systolic heart failure   . 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 Dyslipidemia   . HTN (hypertension)   . Atrial fibrillation   . Tobacco abuse   . Alcohol abuse   . H/O hiatal hernia   . Myocardial infarction   . Dysrhythmia     atrial fib  . Stroke 5/15  . CHF (congestive heart failure)   . Shortness of breath     occ  . GERD (gastroesophageal reflux disease)   . S/P chemotherapy, time since 4-12 weeks   . Cancer       around nose.  New dx of lung cancer  . Non-small cell carcinoma of lung, stage 3 12/16/2014     PSH:   Past Surgical History  Procedure Laterality Date  . Umbilical hernia repair  2010  . Hernia repair  2012  . Ventral hernia repair N/A 01/22/2013    Procedure: HERNIA REPAIR VENTRAL ADULT;  Surgeon: PMerrie Roof MD;  [facility]  Service: General;  Laterality: N/A;  . Application of a-cell of chest/abdomen N/A 01/22/2013    Procedure: APPLICATION OF A-CELL OF CHEST/ABDOMEN;  Surgeon: PMerrie Roof MD;  [facility]  Service: General;  Laterality: N/A;  . Cystoscopy N/A 01/22/2013    Procedure: CConsuela Mimes  Surgeon: MClaybon Jabs MD;  [facility]  Service: Urology;  Laterality: N/A;  Cystoscopy with balloon dilation. Insertion of coude catheter.  . Craniotomy Right 04/19/2014    Procedure: CRANIOTOMY HEMATOMA EVACUATION SUBDURAL;  Surgeon: GElaina Hoops MD;  [facility];  Service: Neurosurgery;  Laterality: Right;  right  . Craniotomy Right 04/23/2014    Procedure: Craniotomy for Intracerebral Hemorrhage;  Surgeon: GElaina Hoops MD;  [facility];  Service: Neurosurgery;  Laterality: Right;  . 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 Coronary artery bypass graft  12/24/2008    4 vessel  . Coronary artery bypass graft  2012?  Marland Kitchen Craniotomy N/A 09/13/2014    Procedure: CRANIOTOMY BONE FLAP/PROSTHETIC PLATE;  Surgeon: [name], MD;  Location: [facility];  Service: Neurosurgery;  Laterality: N/A;  . Video bronchoscopy with endobronchial ultrasound N/A 11/09/2014    Procedure: VIDEO BRONCHOSCOPY WITH ENDOBRONCHIAL ULTRASOUND;  Surgeon: Collene Gobble, MD;  [facility];  Service: Thoracic;  Laterality: N/A;    Allergies:  Review of patient's allergies indicates no known allergies. Prior to Admit  Meds:   (Not in a hospital admission) Fam HX:    Family History  Problem Relation Age of Onset  . Diabetes Mother   . Heart disease Mother   . Diabetes Father   . Heart disease Father   . Heart disease Brother     s/p CABG at 88  . Colon cancer Neg Hx   . Prostate cancer Neg Hx   . Rheum arthritis Daughter   . Heart attack Mother   . Heart attack Father   . Heart attack Brother   . Stroke Neg Hx    Social HX:    History   Social History  . Marital Status: Married    Spouse Name: N/A  . Number of Children: 2  . Years of Education: N/A   Occupational History  . [employer] - retired    Social History Main Topics  . Smoking status: Former Smoker -- 1.00 packs/day for 50 years    Types: Cigarettes    Quit date: 04/19/2013  . Smokeless tobacco: Never Used  . Alcohol Use: No     Comment: quit  . Drug Use: No  . Sexual Activity: Not on file   Other Topics Concern  . Not on file   Social History Narrative   Lives in [place], [place] with wife.     ROS:  All 11 ROS were addressed and are negative except what is stated in the HPI  Physical Exam: Blood pressure 149/77, pulse 87, temperature 98.7 F (37.1 C), temperature source Oral, resp. rate 16, SpO2 100 %.    General: Well developed, well nourished, in no acute distress Head: Eyes PERRLA, No xanthomas.   Normal cephalic and atramatic  Lungs:   Clear bilaterally to auscultation and percussion. Heart:   HRRR S1 S2 Pulses are 2+ & equal.            No carotid bruit. No JVD.  No abdominal bruits. No femoral bruits. Abdomen: Bowel sounds are positive, abdomen soft and non-tender without masses  Extremities:   No clubbing, cyanosis or edema.  DP +1 Neuro: Alert and oriented X 3. Psych:  Good affect, responds appropriately    Labs:   Lab Results  Component Value Date   WBC 6.8 05/16/2015   HGB 9.4* 05/16/2015   HCT 27.6* 05/16/2015   MCV 93.7 05/16/2015   PLT 265 05/16/2015   No results for  input(s): NA, K, CL, CO2, BUN, CREATININE, CALCIUM, PROT, BILITOT, ALKPHOS, ALT, AST, GLUCOSE in the last 168 hours.  Invalid input(s): LABALBU No results found for: PTT Lab Results  Component Value Date   INR 1.05 01/07/2015   INR 1.09 11/29/2014   INR 1.06 11/09/2014   PROTIME 26.7  05/19/2009   Lab Results  Component Value Date   CKTOTAL 21 02/06/2009   CKMB 0.8 02/06/2009   TROPONINI <0.30 04/19/2014     Lab Results  Component Value Date   CHOL 120 09/23/2014   CHOL 110 09/08/2013   CHOL 86 01/29/2013   Lab Results  Component Value Date   HDL 29.60* 09/23/2014   HDL 35.60* 09/08/2013   HDL 29.40* 12/31/2011   Lab Results  Component Value Date   LDLCALC 56 09/23/2014   LDLCALC 52 09/08/2013   LDLCALC 37 12/31/2011   Lab Results  Component Value Date   TRIG 174.0* 09/23/2014   TRIG 76 04/26/2014   TRIG 112.0 09/08/2013   Lab Results  Component Value Date   CHOLHDL 4 09/23/2014   CHOLHDL 3 09/08/2013   CHOLHDL 3 12/31/2011   No results found for: LDLDIRECT    Radiology:  No results found.  EKG:  Atrial fibrillation with 63m of horizontal ST segment depression in III and V4-V6 and slight ST elevation in I and aVL.    ASSESSMENT/PLAN:  1.  Abnormal EKG with slight ST elevation in I and aVL and Q wave in I and aVL and ST depression in the anterolateral leads.  EKG was repeated and checked lead placement which was wrong with limb lead reversal. Repeat EKG showed atrial fibrillation with nonspecific IVCD and borderline ST elevation aVR. He had once brief episode of chest pain this am around 11am for 6 minutes but this is resolved.  Initial troponin is normal.  Recent nuclear stress test 04/08/2015 with no ischemia and large scar with EF 24%. Echo EF 30-35%.  He has had SOB for the past 2 days. Repeat EKG shows minimal ST elevation in aVR only and patient has no current CP.  Recent stress test with no ischemia.  No anticoagulation for now due to thrombocytopenia.   Continue to cycle enzymes.  D-Dimer is 2.54 so will get chest CT angio to rule out acute PE. 2.  SOB for several days with minimally elevated BNP.  Recent echo with EF 30-35%. Chest xray today with no CHF. 3.  Bowel and bladder incontinence with back pain - per hospitlist 4.  ? Seizure activity last pm with confusion following - w/u per hospitalist 5.  Atrial fibrillation with CVR - not on anticoagulation 2/2 chemo induced pancytopenia.  Continue BB/Dig. 6.  ASCAD s/p remote CABG x 4 2010.  Continue ASA/BB.  He is not on statin therapy for ? Reason.  Will need to look into further. 7.  HTN - controlled.  Continue BB 8.  Chronic systolic CHF EF 358-83%by echo 5/16 - BNP minimally elevated but does not appear volume overloaded on exam and chest xray with no CHF 9.  Stage 3 non small cell lung CA s/p chemo 10.  Hypokalemia - replete per hospitalist 11.  Pancytopenia    TSueanne Margarita MD  05/23/2015  1:26 PM

## 2015-05-23 NOTE — Telephone Encounter (Signed)
TC from pt's wife stating that her husband's condition has been changing/declining over the last few days. He has had more bladder and bowel incontinence, increased weakness as evidenced by needing to use walker and wheelchair more, not being able to walk as far-barely able to get down hallway. Also his wife states that she believes he had a seizure last night-stiffened arms and legs, fixed gaze that lasted <5-10 secs. She states that he was 'fuzzy headed' afterwords.. She states he is slurring his words some and just over all not doing very well. He is scheduled for MRI of brain and back tomorrow.  Discussed siutation with Selena Lesser, NP in [facility] and agreed that pt needs to go to [facility] asap for evaluation/scans etc. Selena Lesser, NP to inform Dr. Julien Nordmann of situation.   Call back to Mrs. Flanagin and instructed her to call 911 for transport to ED. She was very agreeable to that-stated he just had another episode of bowel incontinence.

## 2015-05-23 NOTE — Progress Notes (Signed)
EDCM spoke to patient and his wife at bedside.  Patient confirms he lives at home with his wife.  Patient's wife reports patient was receiving home health services with [facility] for RN and PT but have completed.  Patient has 2 walkers, 2 canes, shower chair, wheelchair and an elevated toilet seat at home.  Patient noted to be admitted and discharged from the hospital from 5/26 to 5/30.  Patient confirms his pcp is Dr. Larose Kells.  Patient reports he has seen Dr. Larose Kells and Dr. Earlie Server since he has been discharged.  Patient does not recall receiving  A discharge follow up phone call.  No further EDCM needs at this time.

## 2015-05-23 NOTE — ED Notes (Signed)
Pt zosyn started but Dr Rockne Menghini on the computer so unable to scan meds.

## 2015-05-23 NOTE — H&P (Addendum)
History and Physical:    Alejandro [name]   KWI:[id number] DOB: [birth year] DOA: 05/23/2015  Referring physician: Dr. Tawnya Crook PCP: [name], MD  Oncologist: Dr. Julien Nordmann  Chief Complaint: Weakness, chest pain.  History of Present Illness:   Alejandro [name] is an 69 y.o. 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 Julien Nordmann, h/o afib w/ RVR managed with beta blockers and digoxin (blood thinners stopped due to fall risk), seizure, who presented with 2 day history of weakness and new onset bladder/bowel incontinence, as well as a possible seizure event last night night (patient says he bent down to pick something up and got dizzy, wife says he stiffened up and was unresponsive with shaking limbs for 5-10 seconds followed by slurring of the speech), whose wife brought him in to the ED secondary to worsening dyspnea, weakness and ongoing incontinence.  When being evaluated by the EDP, the patient also admitted to intermittent central  weaknesschest pains associated with activity. He gets very dyspneic and pre-syncopal with activity.  Initial EKG showed < 70m of ST segment elevation in I And aVL and ST depression in the anterior leads.  ROS:   Constitutional: No fever, + chills;  Appetite diminished; No weight loss, no weight gain, + fatigue.  HEENT: No blurry vision, no diplopia, no pharyngitis, no dysphagia, +hoarseness CV: + chest pain, + palpitations, no PND, no orthopnea, + pedal/ankle edema.  Resp: No SOB, + cough, no pleuritic pain. GI: No nausea, no vomiting, no diarrhea, no melena, no hematochezia, no constipation, no abdominal pain, +bloating.  GU: No dysuria, no hematuria, no frequency, no urgency, +urinary incontinence. MSK: no myalgias, no arthralgias, +pain in legs.  Neuro:  No headache, no focal neurological deficits, ? history of  seizures, +hand paresthesias.  Psych: No depression, no anxiety.  Endo: No heat intolerance, no cold intolerance, no polyuria, no polydipsia  Skin: + rashes related to Neulasta, no skin lesions.  Heme: No easy bruising.  Travel history: No recent travel.   Past Medical History:   Past Medical History  Diagnosis Date  . Systolic heart failure   . 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 Dyslipidemia   . HTN (hypertension)   . Atrial fibrillation   . Tobacco abuse   . Alcohol abuse   . H/O hiatal hernia   . Myocardial infarction   . Dysrhythmia     atrial fib  . Stroke 5/15  . CHF (congestive heart failure)   . Shortness of breath     occ  . GERD (gastroesophageal reflux disease)   . S/P chemotherapy, time since 4-12 weeks   . Cancer     around nose.  New dx of lung cancer  . Non-small cell carcinoma of lung, stage 3 12/16/2014    Past Surgical History:   Past Surgical History  Procedure Laterality Date  . Umbilical hernia repair  2010  . Hernia repair  2012  . Ventral hernia repair N/A 01/22/2013    Procedure: HERNIA REPAIR VENTRAL ADULT;  Surgeon: PMerrie Roof MD;  [facility]  Service: General;  Laterality: N/A;  . Application of a-cell of chest/abdomen N/A 01/22/2013    Procedure: APPLICATION OF A-CELL OF CHEST/ABDOMEN;  Surgeon: PMerrie Roof MD;  [facility]  Service: General;  Laterality: N/A;  . Cystoscopy N/A 01/22/2013    Procedure: CYSTOSCOPY;  Surgeon: [name], MD;  Location: [facility];  Service: Urology;  Laterality: N/A;  Cystoscopy with balloon dilation. Insertion of coude catheter.  . Craniotomy Right 04/19/2014    Procedure: CRANIOTOMY HEMATOMA EVACUATION SUBDURAL;  Surgeon: [name], MD;  Location: [facility];  Service: Neurosurgery;  Laterality: Right;  right  . Craniotomy Right 04/23/2014    Procedure: Craniotomy for Intracerebral Hemorrhage;  Surgeon: [name], MD;  Location: [facility];  Service:  Neurosurgery;  Laterality: Right;  . 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 Coronary artery bypass graft  12/24/2008    4 vessel  . Coronary artery bypass graft  2012?  Marland Kitchen Craniotomy N/A 09/13/2014    Procedure: CRANIOTOMY BONE FLAP/PROSTHETIC PLATE;  Surgeon: [name], MD;  Location: [facility];  Service: Neurosurgery;  Laterality: N/A;  . Video bronchoscopy with endobronchial ultrasound N/A 11/09/2014    Procedure: VIDEO BRONCHOSCOPY WITH ENDOBRONCHIAL ULTRASOUND;  Surgeon: Collene Gobble, MD;  Location: [facility];  Service: Thoracic;  Laterality: N/A;    Social History:   History   Social History  . Marital Status: Married    Spouse Name: N/A  . Number of Children: 2  . Years of Education: N/A   Occupational History  . [employer] - retired    Social History Main Topics  . Smoking status: Former Smoker -- 1.00 packs/day for 50 years    Types: Cigarettes    Quit date: 04/19/2013  . Smokeless tobacco: Never Used  . Alcohol Use: No     Comment: quit  . Drug Use: No  . Sexual Activity: Not on file   Other Topics Concern  . Not on file   Social History Narrative   Lives in [place], [place] with wife.  Ambulates with a walker.    Family history:   Family History  Problem Relation Age of Onset  . Diabetes Mother   . Heart disease Mother   . Diabetes Father   . Heart disease Father   . Heart disease Brother     s/p CABG at 18  . Colon cancer Neg Hx   . Prostate cancer Neg Hx   . Rheum arthritis Daughter   . Heart attack Mother   . Heart attack Father   . Heart attack Brother   . Stroke Neg Hx     Allergies   Review of patient's allergies indicates no known allergies.  Current Medications:   Prior to Admission medications   Medication Sig Start Date End Date Taking? Authorizing Provider    albuterol (PROVENTIL HFA;VENTOLIN HFA) 108 (90 BASE) MCG/ACT inhaler Inhale 2 puffs into the lungs every 4 (four) hours as needed for wheezing or shortness of breath. 03/22/15  Yes Collene Gobble, MD  aspirin 81 MG tablet Take 81 mg by mouth daily.   Yes Historical Provider, MD  digoxin (LANOXIN) 0.125 MG tablet Take 1 tablet (0.125 mg total) by mouth daily. Patient taking differently: Take 0.0625 mg by mouth 2 (two) times daily.  05/02/15  Yes Belkys A Regalado, MD  FLUoxetine (PROZAC) 20 MG capsule Take 1 capsule (20 mg total) by mouth daily. 04/15/15  Yes Colon Branch, MD  furosemide (LASIX) 40 MG tablet Take 1 tablet (40  mg total) by mouth daily. 05/02/15  Yes Belkys A Regalado, MD  levETIRAcetam (KEPPRA) 500 MG tablet Take 1 tablet (500 mg total) by mouth 2 (two) times daily. 04/15/15  Yes Colon Branch, MD  lidocaine-prilocaine (EMLA) cream Apply 1 application topically as needed. Patient taking differently: Apply 1 application topically as needed (pain).  03/28/15  Yes Maryanna Shape, NP  metoprolol (LOPRESSOR) 50 MG tablet Take 25 mg by mouth 2 (two) times daily.   Yes Historical Provider, MD  Multiple Vitamin (MULTIVITAMIN WITH MINERALS) TABS tablet Take 1 tablet by mouth daily. 05/14/14  Yes Daniel J Sister [place], PA-C  UNABLE TO FIND Med Name:Wheelchair Per medical necessity patient requires wheelchair to all perform daily activities. Pt is able to self propel chair. Unable to use can or walker due to balance 04/26/15  Yes Susanne Borders, NP  fluconazole (DIFLUCAN) 100 MG tablet Take 1 tablet (100 mg total) by mouth daily. Patient not taking: Reported on 05/23/2015 05/09/15   Carlton Adam, PA-C  metoprolol tartrate (LOPRESSOR) 25 MG tablet Take 1 tablet (25 mg total) by mouth 2 (two) times daily. Patient not taking: Reported on 05/23/2015 05/02/15   Elmarie Shiley, MD    Physical Exam:   Filed Vitals:   05/23/15 1124  BP: 149/77  Pulse: 87  Temp: 98.7 F (37.1 C)  TempSrc: Oral  Resp:  16  SpO2: 100%     Physical Exam: Blood pressure 149/77, pulse 87, temperature 98.7 F (37.1 C), temperature source Oral, resp. rate 16, SpO2 100 %. Gen: No acute distress. Head: Normocephalic, atraumatic. Eyes: PERRL, EOMI, sclerae nonicteric. Mouth: Oropharynx with some thrush. Edentulous. Neck: Supple, no thyromegaly, no lymphadenopathy, no jugular venous distention. Chest: Lungs clear to auscultation bilaterally. No wheezes or rales. CV: Heart sounds are irregular. No murmurs, rubs, or gallops. Abdomen: Soft, nontender, nondistended with normal active bowel sounds. Extremities: Extremities are without significant clubbing, edema, or cyanosis. Skin: Warm and dry. Few scattered abrasions. Neuro: Alert and oriented times 3;  grossly nonfocal Psych: Mood and affect normal.   Data Review:    Labs: Basic Metabolic Panel:  Recent Labs Lab 05/23/15 1313  NA 135  K 3.0*  CL 100*  CO2 24  GLUCOSE 112*  BUN 28*  CREATININE 1.31*  CALCIUM 9.1   Liver Function Tests: No results for input(s): AST, ALT, ALKPHOS, BILITOT, PROT, ALBUMIN in the last 168 hours. No results for input(s): LIPASE, AMYLASE in the last 168 hours. No results for input(s): AMMONIA in the last 168 hours. CBC:  Recent Labs Lab 05/23/15 1313  WBC 0.7*  NEUTROABS 0.2*  HGB 8.8*  HCT 25.6*  MCV 90.5  PLT 88*   Cardiac Enzymes: No results for input(s): CKTOTAL, CKMB, CKMBINDEX, TROPONINI in the last 168 hours.  BNP (last 3 results)  Recent Labs  03/29/15 1133 04/05/15 1134  PROBNP 543.0* 252.0*   CBG: No results for input(s): GLUCAP in the last 168 hours.  Radiographic Studies: Dg Chest 2 View  05/23/2015   CLINICAL DATA:  Short of breath. Diarrhea. RIGHT-sided lung cancer. Left-sided chest pain. Weakness.  EXAM: CHEST  2 VIEW  COMPARISON:  Multiple priors dating back to 09/07/2014.  FINDINGS: Cardiopericardial silhouette enlarged. Median sternotomy. Monitoring leads project over the chest.  RIGHT IJ power port with the tip in the lower superior vena cava.  There is mild apical lordotic projection on today's chest radiograph. When compared to 05/11/2014, the opacity in the RIGHT mid lung continues to be  more prominent than on prior chest radiographs, suggesting progression of tumor in the RIGHT lung. This could also represent radiation changes if patient is undergoing radiation therapy.  IMPRESSION: Short term stability of the RIGHT chest nodule with surrounding airspace opacity. No definite acute cardiopulmonary disease on today's chest radiograph. Chronic cardiomegaly without failure.   Electronically Signed   By: Dereck Ligas M.D.   On: 05/23/2015 13:44   Ct Head Wo Contrast  05/23/2015   CLINICAL DATA:  Shortness of breath and chest pain. Increasing weakness. History of brain surgery.  EXAM: CT HEAD WITHOUT CONTRAST  TECHNIQUE: Contiguous axial images were obtained from the base of the skull through the vertex without intravenous contrast.  COMPARISON:  06/01/2014 head CT  FINDINGS: Skull and Sinuses:A right craniotomy bone flap has been replaced. The operative site is unremarkable.  No sinus or mastoid effusion.  Orbits: No acute abnormality.  Brain: No evidence of acute infarction, hemorrhage, hydrocephalus, or mass lesion/mass effect.  Expected evolution of low-density in the right temporal lobe, now with circumscribed margins and negative mass effect, consistent with gliosis. A parenchymal hematoma was noted in this region on 2015 imaging.  Stable pattern of extensive chronic small vessel disease with patchy ischemic gliosis throughout the bilateral cerebral white matter. There have been numerous remote bilateral cerebellar infarcts.  IMPRESSION: 1. No acute findings. 2. 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 3. Posttraumatic right temporal lobe gliosis.   Electronically Signed   By: Monte Fantasia M.D.   On: 05/23/2015 14:00   Ct Angio  Chest Pe W/cm &/or Wo Cm  05/23/2015   CLINICAL DATA:  Left-sided chest pain and short of breath. Elevated D-dimer. Non-small cell lung cancer diagnosis January 2016. Chemotherapy in progress. Radiation therapy complete.  EXAM: CT ANGIOGRAPHY CHEST WITH CONTRAST  TECHNIQUE: Multidetector CT imaging of the chest was performed using the standard protocol during bolus administration of intravenous contrast. Multiplanar CT image reconstructions and MIPs were obtained to evaluate the vascular anatomy.  CONTRAST:  70m OMNIPAQUE IOHEXOL 350 MG/ML SOLN  COMPARISON:  Chest CT 03/11/2015  FINDINGS: Mediastinum/Nodes: No filling defects within the pulmonary arteries to suggest acute pulmonary embolism. No acute findings of the aorta great vessels.  No mediastinal lymphadenopathy. Right lower paratracheal lymph node measures 10 mm short axis decreased from 19 on prior. No pericardial fluid. Post CABG anatomy  Lungs/Pleura: There is patchy foci of could type consolidation with air bronchograms in the right upper lobe, right middle lobe and superior segment of the right lower lobe. There is central para bronchovascular thickening extending from the right hilum. Left lung is clear.  Upper abdomen: Limited view of the liver, kidneys, pancreas are unremarkable. Normal adrenal glands.  Musculoskeletal: No acute osseous abnormality.  Review of the MIP images confirms the above findings.  IMPRESSION: 1. New patchy consolidation in the right upper lobe, right middle lobe and superior right lower with differential including multifocal pneumonia, aspiration pneumonitis, or delayed postradiation change. Lung cancer recurrence is not favored. Unilateral drug reaction would also be unlikely. 2. No evidence of acute pulmonary embolism.   Electronically Signed   By: SSuzy BouchardM.D.   On: 05/23/2015 15:55    EKG: Independently reviewed. Atrial fibrillation at 93 bpm < 140mof ST segment elevation in I And aVL and ST depression in the  anterior leads.   Assessment/Plan:   Principal Problem:   HCAP (healthcare-associated pneumonia) - Obtain sputum and blood cultures, sputum culture, strep pneumonia/Legionella  antigen, HIV. - Start empiric vancomycin/Zosyn.  Active Problems:   Questionable seizure / weakness  - Patient denies ever having a confirmed seizure diagnosis but is on Keppra. - Continue Keppra. - Check EEG. - MRI of the brain, lumbar and thoracic spine ordered.     Dyslipidemia - Not currently on statin therapy. Check lipid panel.    Coronary atherosclerosis of native coronary artery / Chest pain / dyspnea - Continue aspirin and beta blocker. - Evaluated by cardiology. - CT of the chest ordered to rule out pulmonary embolism. -  Cycle troponins.    Chronic systolic congestive heart failure, NYHA class 3 - Appears compensated. Hold Lasix and gently hydrate. Monitor closely for signs of volume overload.  - Daily weights.     COPD mixed type - No evidence of acute exacerbation. Continue bronchodilation as is needed.    Non-small cell carcinoma of lung, stage 3 - Status post radiation therapy. Currently receiving chemotherapy with Taxol and carboplatin, last given 05/16/15. - We'll notify Dr. Julien Nordmann of the patient's admission.    Antineoplastic chemotherapy induced pancytopenia / neutropenia - Receive Neulasta on 05/18/15.     Atrial fibrillation, chronic - Continue digoxin and beta blocker. Continue aspirin.  - Not a candidate for anticoagulation secondary to high fall risk.    Hypokalemia - We'll give 40 mEq of oral potassium now and replace potassium in IV fluids.     DVT prophylaxis - SCDs only given thrombocytopenia.   Code Status: Full. Family Communication: Pamala Hurry [phone]), updated at the bedside. Disposition Plan: Home when stable.  Time spent: 70 minutes.  RAMA,CHRISTINA [facility] Pager [phone] Cell: [phone]   If 7PM-7AM, please contact  night-coverage [email or web address] Password [facility] 05/23/2015, 5:38 PM

## 2015-05-24 ENCOUNTER — Ambulatory Visit (HOSPITAL_COMMUNITY): Payer: BLUE CROSS/BLUE SHIELD

## 2015-05-24 ENCOUNTER — Ambulatory Visit (HOSPITAL_COMMUNITY): Admission: RE | Admit: 2015-05-24 | Payer: BLUE CROSS/BLUE SHIELD | Source: Ambulatory Visit

## 2015-05-24 ENCOUNTER — Inpatient Hospital Stay (HOSPITAL_COMMUNITY)
Admit: 2015-05-24 | Discharge: 2015-05-24 | Disposition: A | Discharge status: Home / Self Care | Payer: Medicare Other | Attending: Internal Medicine | Admitting: Internal Medicine

## 2015-05-24 ENCOUNTER — Inpatient Hospital Stay (HOSPITAL_COMMUNITY): Payer: Medicare Other

## 2015-05-24 DIAGNOSIS — R7881 Bacteremia: Secondary | ICD-10-CM

## 2015-05-24 DIAGNOSIS — D6959 Other secondary thrombocytopenia: Secondary | ICD-10-CM

## 2015-05-24 DIAGNOSIS — R531 Weakness: Secondary | ICD-10-CM

## 2015-05-24 DIAGNOSIS — J189 Pneumonia, unspecified organism: Secondary | ICD-10-CM | POA: Insufficient documentation

## 2015-05-24 DIAGNOSIS — D701 Agranulocytosis secondary to cancer chemotherapy: Secondary | ICD-10-CM

## 2015-05-24 DIAGNOSIS — D6481 Anemia due to antineoplastic chemotherapy: Secondary | ICD-10-CM

## 2015-05-24 DIAGNOSIS — R5383 Other fatigue: Secondary | ICD-10-CM

## 2015-05-24 DIAGNOSIS — R569 Unspecified convulsions: Secondary | ICD-10-CM

## 2015-05-24 DIAGNOSIS — C3411 Malignant neoplasm of upper lobe, right bronchus or lung: Secondary | ICD-10-CM

## 2015-05-24 LAB — BASIC METABOLIC PANEL
ANION GAP: 11 (ref 5–15)
BUN: 25 mg/dL — ABNORMAL HIGH (ref 6–20)
CO2: 23 mmol/L (ref 22–32)
Calcium: 8.2 mg/dL — ABNORMAL LOW (ref 8.9–10.3)
Chloride: 102 mmol/L (ref 101–111)
Creatinine, Ser: 1.25 mg/dL — ABNORMAL HIGH (ref 0.61–1.24)
GFR calc Af Amer: >60 mL/min (ref >60)
GFR calc non Af Amer: 57 mL/min — ABNORMAL LOW (ref >60)
Glucose, Bld: 121 mg/dL — ABNORMAL HIGH (ref 65–99)
POTASSIUM: 3.1 mmol/L — AB (ref 3.5–5.1)
SODIUM: 136 mmol/L (ref 135–145)

## 2015-05-24 LAB — LIPID PANEL
CHOL/HDL RATIO: 9 ratio
Cholesterol: 144 mg/dL (ref 0–200)
HDL: 16 mg/dL — ABNORMAL LOW (ref >40)
LDL CALC: 84 mg/dL (ref 0–99)
Triglycerides: 221 mg/dL — ABNORMAL HIGH (ref <150)
VLDL: 44 mg/dL — AB (ref 0–40)

## 2015-05-24 LAB — CLOSTRIDIUM DIFFICILE BY PCR: Toxigenic C. Difficile by PCR: NEGATIVE

## 2015-05-24 LAB — CBC
HEMATOCRIT: 22.7 % — AB (ref 39.0–52.0)
HEMOGLOBIN: 7.8 g/dL — AB (ref 13.0–17.0)
MCH: 31.5 pg (ref 26.0–34.0)
MCHC: 34.4 g/dL (ref 30.0–36.0)
MCV: 91.5 fL (ref 78.0–100.0)
Platelets: 69 10*3/uL — ABNORMAL LOW (ref 150–400)
RBC: 2.48 MIL/uL — ABNORMAL LOW (ref 4.22–5.81)
RDW: 16.5 % — AB (ref 11.5–15.5)
WBC: 1 10*3/uL — CL (ref 4.0–10.5)

## 2015-05-24 LAB — MAGNESIUM: Magnesium: 1.4 mg/dL — ABNORMAL LOW (ref 1.7–2.4)

## 2015-05-24 LAB — TROPONIN I
TROPONIN I: 0.08 ng/mL — AB (ref <0.031)
Troponin I: 0.06 ng/mL — ABNORMAL HIGH (ref <0.031)

## 2015-05-24 LAB — HIV ANTIBODY (ROUTINE TESTING W REFLEX): HIV Screen 4th Generation wRfx: NONREACTIVE

## 2015-05-24 LAB — STREP PNEUMONIAE URINARY ANTIGEN: STREP PNEUMO URINARY ANTIGEN: NEGATIVE

## 2015-05-24 MED ORDER — MAGNESIUM SULFATE 4 GM/100ML IV SOLN
4.0000 g | Freq: Once | INTRAVENOUS | Status: AC
  Administered 2015-05-24: 4 g via INTRAVENOUS
  Filled 2015-05-24: qty 100

## 2015-05-24 MED ORDER — POTASSIUM CHLORIDE CRYS ER 20 MEQ PO TBCR
20.0000 meq | EXTENDED_RELEASE_TABLET | Freq: Two times a day (BID) | ORAL | Status: DC
  Administered 2015-05-24 – 2015-05-29 (×11): 20 meq via ORAL
  Filled 2015-05-24 (×11): qty 1

## 2015-05-24 MED ORDER — TBO-FILGRASTIM 480 MCG/0.8ML ~~LOC~~ SOSY
480.0000 ug | PREFILLED_SYRINGE | Freq: Every day | SUBCUTANEOUS | Status: DC
  Administered 2015-05-24: 480 ug via SUBCUTANEOUS
  Filled 2015-05-24 (×3): qty 0.8

## 2015-05-24 NOTE — Progress Notes (Addendum)
Progress Note   DOZIER BERKOVICH HWE:[id number] DOB: July 12, 1946 DOA: 05/23/2015 PCP: [name], MD   Brief Narrative:   Alejandro [name] is an 69 y.o. 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 Julien Nordmann, h/o afib w/ RVR managed with beta blockers and digoxin (blood thinners stopped due to fall risk), seizure, who was admitted 05/23/15 with 2 day history of weakness and new onset bladder/bowel incontinence, as well as a possible seizure event last night night (patient says he bent down to pick something up and got dizzy, wife says he stiffened up and was unresponsive with shaking limbs for 5-10 seconds followed by slurring of the speech). He has been seen and evaluated by the cardiologist. Upon initial evaluation in the ED, CT of the chest showed findings consistent with pneumonia. Preliminary blood cultures growing gram-positive cocci in pairs.  Assessment/Plan:   Principal Problem:  HCAP (healthcare-associated pneumonia) / bacteremia - F/U sputum and blood cultures, sputum culture, strep pneumonia/Legionella antigen, HIV. - Continue empiric vancomycin/Zosyn. Narrow when clinically more stable and WBC/neutropenia improved. - Preliminary blood culture positive for gram-positive cocci in pairs.  Active Problems:  Questionable seizure / weakness  - Patient denies ever having a confirmed seizure diagnosis but is on Keppra. - Continue Keppra. - EEG done, results pending. - F/U MRI of the brain, lumbar and thoracic spine.    Dyslipidemia - Not currently on statin therapy. F/U lipid panel.   Coronary atherosclerosis of native coronary artery / Chest pain / dyspnea / demand ischemia - Continue aspirin and beta blocker. - Evaluated by cardiology. - CT of the chest negative for pulmonary embolism. -Troponins mildly elevated, likely  secondary to demand ischemia in the setting of pneumonia. - No further ischemic workup planned by cardiology.   Chronic systolic congestive heart failure, NYHA class 3 - Appears compensated. Hold Lasix and gently hydrate. Monitor closely for signs of volume overload.  - Daily weights.    COPD mixed type - No evidence of acute exacerbation. Continue bronchodilation as is needed.   Non-small cell carcinoma of lung, stage 3 - Status post radiation therapy. Currently receiving chemotherapy with Taxol and carboplatin, last given 05/16/15. - Dr. Julien Nordmann notified through [place] of the patient's admission.   Antineoplastic chemotherapy induced pancytopenia / neutropenia - Receive Neulasta on 05/18/15. WBC slightly improved this morning. We'll start Granix, D/C when ANC > 1000. - Transfuse for hemoglobin less than 7, currently 7.8 mg/dL. - Platelet count stable with no signs of bleeding.   Atrial fibrillation, chronic - Continue digoxin and beta blocker. Continue aspirin.  - Not a candidate for anticoagulation secondary to high fall risk.   Hypokalemia / hypomagnesemia - Start potassium 20 mEq orally twice a day and continue potassium in IV fluids.  - Give 4 g of magnesium sulfate today.   DVT prophylaxis - SCDs only given thrombocytopenia.   Code Status: Full. Family Communication: Pamala Hurry [phone]), updated at the bedside. Disposition Plan: Home when stable, likely 2-3 days depending on recovery of WBC.   IV Access:    Peripheral IV   Procedures and diagnostic studies:   Dg Chest 2 View  05/23/2015   CLINICAL DATA:  Short of breath. Diarrhea. RIGHT-sided lung cancer. Left-sided chest pain. Weakness.  EXAM: CHEST  2 VIEW  COMPARISON:  Multiple priors dating back to 09/07/2014.  FINDINGS: Cardiopericardial  silhouette enlarged. Median sternotomy. Monitoring leads project over the chest. RIGHT IJ power port with the tip in the lower superior vena cava.  There is mild apical  lordotic projection on today's chest radiograph. When compared to 05/11/2014, the opacity in the RIGHT mid lung continues to be more prominent than on prior chest radiographs, suggesting progression of tumor in the RIGHT lung. This could also represent radiation changes if patient is undergoing radiation therapy.  IMPRESSION: Short term stability of the RIGHT chest nodule with surrounding airspace opacity. No definite acute cardiopulmonary disease on today's chest radiograph. Chronic cardiomegaly without failure.   Electronically Signed   By: Dereck Ligas M.D.   On: 05/23/2015 13:44   Ct Head Wo Contrast  05/23/2015   CLINICAL DATA:  Shortness of breath and chest pain. Increasing weakness. History of brain surgery.  EXAM: CT HEAD WITHOUT CONTRAST  TECHNIQUE: Contiguous axial images were obtained from the base of the skull through the vertex without intravenous contrast.  COMPARISON:  06/01/2014 head CT  FINDINGS: Skull and Sinuses:A right craniotomy bone flap has been replaced. The operative site is unremarkable.  No sinus or mastoid effusion.  Orbits: No acute abnormality.  Brain: No evidence of acute infarction, hemorrhage, hydrocephalus, or mass lesion/mass effect.  Expected evolution of low-density in the right temporal lobe, now with circumscribed margins and negative mass effect, consistent with gliosis. A parenchymal hematoma was noted in this region on 2015 imaging.  Stable pattern of extensive chronic small vessel disease with patchy ischemic gliosis throughout the bilateral cerebral white matter. There have been numerous remote bilateral cerebellar infarcts.  IMPRESSION: 1. No acute findings. 2. 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 3. Posttraumatic right temporal lobe gliosis.   Electronically Signed   By: Monte Fantasia M.D.   On: 05/23/2015 14:00   Ct Angio Chest Pe W/cm &/or Wo Cm  05/23/2015   CLINICAL DATA:  Left-sided chest pain and short of  breath. Elevated D-dimer. Non-small cell lung cancer diagnosis January 2016. Chemotherapy in progress. Radiation therapy complete.  EXAM: CT ANGIOGRAPHY CHEST WITH CONTRAST  TECHNIQUE: Multidetector CT imaging of the chest was performed using the standard protocol during bolus administration of intravenous contrast. Multiplanar CT image reconstructions and MIPs were obtained to evaluate the vascular anatomy.  CONTRAST:  48m OMNIPAQUE IOHEXOL 350 MG/ML SOLN  COMPARISON:  Chest CT 03/11/2015  FINDINGS: Mediastinum/Nodes: No filling defects within the pulmonary arteries to suggest acute pulmonary embolism. No acute findings of the aorta great vessels.  No mediastinal lymphadenopathy. Right lower paratracheal lymph node measures 10 mm short axis decreased from 19 on prior. No pericardial fluid. Post CABG anatomy  Lungs/Pleura: There is patchy foci of could type consolidation with air bronchograms in the right upper lobe, right middle lobe and superior segment of the right lower lobe. There is central para bronchovascular thickening extending from the right hilum. Left lung is clear.  Upper abdomen: Limited view of the liver, kidneys, pancreas are unremarkable. Normal adrenal glands.  Musculoskeletal: No acute osseous abnormality.  Review of the MIP images confirms the above findings.  IMPRESSION: 1. New patchy consolidation in the right upper lobe, right middle lobe and superior right lower with differential including multifocal pneumonia, aspiration pneumonitis, or delayed postradiation change. Lung cancer recurrence is not favored. Unilateral drug reaction would also be unlikely. 2. No evidence of acute pulmonary embolism.   Electronically Signed   By: SSuzy BouchardM.D.   On: 05/23/2015 15:55  Medical Consultants:    None.  Anti-Infectives:    Vancomycin 05/23/15--->  Zosyn 05/23/15--->  Subjective:   [name] still feels weak. Denies current complaints of chest pain, dizziness, nausea or  vomiting. Appetite remains poor. Still mildly dyspneic.  Objective:    Filed Vitals:   05/23/15 1849 05/23/15 2223 05/24/15 0409 05/24/15 0948  BP: 108/51 115/93 115/55 132/74  Pulse: 98 96 97 4  Temp: 98.1 F (36.7 C) 99 F (37.2 C) 98.6 F (37 C)   TempSrc: Oral Oral Oral   Resp: '20 18 18   '$ Height: '5\' 8"'$  (1.727 m)     Weight: 105.1 kg (231 lb 11.3 oz)  106.4 kg (234 lb 9.1 oz)   SpO2: 100% 97% 94%     Intake/Output Summary (Last 24 hours) at 05/24/15 1314 Last data filed at 05/24/15 1034  Gross per 24 hour  Intake    240 ml  Output    277 ml  Net    -37 ml    Exam: Gen:  NAD Cardiovascular:  RRR, No M/R/G Respiratory:  Lungs diminished Gastrointestinal:  Abdomen soft, NT/ND, + BS Extremities:  No C/E/C   Data Reviewed:    Labs: Basic Metabolic Panel:  Recent Labs Lab 05/23/15 1313 05/24/15 0630  NA 135 136  K 3.0* 3.1*  CL 100* 102  CO2 24 23  GLUCOSE 112* 121*  BUN 28* 25*  CREATININE 1.31* 1.25*  CALCIUM 9.1 8.2*  MG  --  1.4*   GFR Estimated Creatinine Clearance: 66 mL/min (by C-G formula based on Cr of 1.25). Liver Function Tests:  Recent Labs Lab 05/23/15 2000  AST 29  ALT 39  ALKPHOS 78  BILITOT 2.0*  PROT 6.4*  ALBUMIN 3.2*   CBC:  Recent Labs Lab 05/23/15 1313 05/24/15 0630  WBC 0.7* 1.0*  NEUTROABS 0.2*  --   HGB 8.8* 7.8*  HCT 25.6* 22.7*  MCV 90.5 91.5  PLT 88* 69*   Cardiac Enzymes:  Recent Labs Lab 05/23/15 2000 05/24/15 0022 05/24/15 0630  TROPONINI 0.07* 0.08* 0.06*   BNP (last 3 results)  Recent Labs  03/29/15 1133 04/05/15 1134  PROBNP 543.0* 252.0*   D-Dimer:  Recent Labs  05/23/15 1313  DDIMER 2.54*   Lipid Profile:  Recent Labs  05/24/15 0630  CHOL 144  HDL 16*  LDLCALC 84  TRIG 221*  CHOLHDL 9.0   Microbiology Recent Results (from the past 240 hour(s))  Urine Culture     Status: None   Collection Time: 05/16/15 11:30 AM  Result Value Ref Range Status   Urine Culture,  Routine Culture, Urine  Final    Comment: ------------------------------------------------------------------------ ITEMS WERE ATTACHED TO THIS ORDER: CULTURE, URINE Final - ===== COLONY COUNT: ===== 6,000 COLONIES/ML Insignificant Growth   Urine culture     Status: None (Preliminary result)   Collection Time: 05/23/15 12:47 PM  Result Value Ref Range Status   Specimen Description URINE, CLEAN CATCH  Final   Special Requests NONE  Final   Culture   Final    CULTURE REINCUBATED FOR BETTER GROWTH Performed at [facility]    Report Status PENDING  Incomplete  Blood culture (routine x 2)     Status: None (Preliminary result)   Collection Time: 05/23/15  4:44 PM  Result Value Ref Range Status   Specimen Description BLOOD RIGHT PORTA CATH  Final   Special Requests BOTTLES DRAWN AEROBIC AND ANAEROBIC 5ML  Final   Culture   Final  NO GROWTH < 24 HOURS Performed at [facility]    Report Status PENDING  Incomplete  Blood culture (routine x 2)     Status: None (Preliminary result)   Collection Time: 05/23/15  5:07 PM  Result Value Ref Range Status   Specimen Description BLOOD LEFT ANTECUBITAL  Final   Special Requests BOTTLES DRAWN AEROBIC AND ANAEROBIC 5ML  Final   Culture   Final    NO GROWTH < 24 HOURS Performed at [facility]    Report Status PENDING  Incomplete  Clostridium Difficile by PCR (not at [facility])     Status: None   Collection Time: 05/24/15  6:24 AM  Result Value Ref Range Status   C difficile by pcr NEGATIVE NEGATIVE Final     Medications:   . aspirin  81 mg Oral Daily  . digoxin  0.0625 mg Oral BID  . fluconazole  100 mg Oral Daily  . FLUoxetine  20 mg Oral Daily  . levETIRAcetam  500 mg Oral BID  . metoprolol  25 mg Oral BID  . multivitamin with minerals  1 tablet Oral Daily  . piperacillin-tazobactam (ZOSYN)  IV  3.375 g Intravenous 3 times per day  . potassium chloride  20 mEq Oral BID  . sodium chloride  3 mL Intravenous Q12H  .  vancomycin  1,250 mg Intravenous Q24H   Continuous Infusions: . 0.9 % NaCl with KCl 40 mEq / L 75 mL/hr (05/23/15 2121)    Time spent: 35 minutes.  The patient is medically complex and requires high complexity decision making.    LOS: 1 day   RAMA,CHRISTINA  [facility] Pager [phone]. If unable to reach me by pager, please call my cell phone at [phone].  *Please refer to [email or web address], password [email or web address] to get updated schedule on who will round on this patient, as hospitalists switch teams weekly. If 7PM-7AM, please contact night-coverage at [email or web address], password [email or web address] for any overnight needs.  05/24/2015, 1:14 PM

## 2015-05-24 NOTE — Progress Notes (Signed)
EEG completed, results pending. 

## 2015-05-24 NOTE — Procedures (Signed)
ELECTROENCEPHALOGRAM REPORT   Patient: Alejandro [name]       Room #: XB3532 EEG No. ID: [id number] Age: 69 y.o.        Sex: male Referring Physician: Rama Report Date:  05/24/2015        Interpreting Physician: Alexis Goodell  History: [name] is an 69 y.o. male with a history of seizures now presenting with an episode of stiffening and unresponsiveness. S/P right craniotomy in May of 2015.    Medications:  Scheduled: . aspirin  81 mg Oral Daily  . digoxin  0.0625 mg Oral BID  . fluconazole  100 mg Oral Daily  . FLUoxetine  20 mg Oral Daily  . levETIRAcetam  500 mg Oral BID  . magnesium sulfate 1 - 4 g bolus IVPB  4 g Intravenous Once  . metoprolol  25 mg Oral BID  . multivitamin with minerals  1 tablet Oral Daily  . piperacillin-tazobactam (ZOSYN)  IV  3.375 g Intravenous 3 times per day  . potassium chloride  20 mEq Oral BID  . sodium chloride  3 mL Intravenous Q12H  . Tbo-Filgrastim  480 mcg Subcutaneous q1800  . vancomycin  1,250 mg Intravenous Q24H    Conditions of Recording:  This is a 16 channel EEG carried out with the patient in the awake, drowsy and asleep states.  Description:  The waking background activity is asymmetric.  There is a mixture of poorly organized theta and delta activity over the left and right hemisphere.  This activity is further slowed though over the right hemisphere due to an underlying polymorphic delta activity.  This polymorphic delta activity is most prominent in the right temporal region.  Independently over the left temporal region are noted left temporal sharp transients with phase reversal at T3.  At times this sharp activity can occur in rhythmic groupings lasting up to one second.   There is not significant change in the background rhythm with drowse.  The patient does appear to achieve sleep though with spindle-like activity that occurs over the left hemisphere that is not apparent over the right hemisphere.   Hyperventilation and  intermittent photic stimulation were not performed.   IMPRESSION: This is an abnormal electroencephalogram due to a breech rhythm over the right hemisphere which is consistent with the patient's history of a right craniotomy.  Also present was sharp activity emanating from the left temporal region (phase reversal at T3).  This finding is consistent with the patient's history of seizures.     Alexis Goodell, MD [facility] [phone] 05/24/2015, 2:31 PM

## 2015-05-24 NOTE — Progress Notes (Signed)
[place]  [phone]   Patient Care Team: Colon Branch, MD as PCP - General (Internal Medicine)  HOSPITAL PROGRESS  NOTE   HPI: Alejandro [name] is a 69 year old man with a history on Non Small cell Carcinoma as detailed below, admitted on 6/20 with a 2 day history of generalized weakness, chills, worsening dyspnea with cough, intermittent chest pain and presyncope. He also had a possible seizure while at home. On presentation he had bowel and urine incontinence.  CT of the head on 6/20 was negative for acute intracranial findings. CT angio of the chest was negative for PE, with findings suggestive of multifocal pneumonia, aspiration pneumonitis, or delayed postradiation change. Lung cancer recurrence is not favored.Cultures were drawn and he was placed on broad spectrum antibiotics. Cardiology following as well.  Denies fevers or night sweats, vision changes, or mucositis. He reports ankle swelling. Denies nausea, heartburn or abdominal pain. Appetite is normal. He had recent skin rashes after receiving Neulasta, but no new rashes or lesions are reported. He has known  neuropathy. Denies any bleeding issues such as epistaxis, hematemesis, hematuria or hematochezia.  We were kindly informed of the patient's admission   DIAGNOSIS: Non-small cell carcinoma of lung, stage 3  Staging form: Lung, AJCC 7th Edition  Clinical stage from 12/16/2014: Stage IIIA (T1b, N2, M0) - Signed by Alejandro Bears, MD on 12/18/2014  Staging comments: Squamous cell carcinoma  PRIOR THERAPY: Concurrent chemoradiation with weekly carboplatin for an AUC of 2 and paclitaxel 45 mg/m2.  CURRENT THERAPY: Consolidation chemotherapy with carboplatin AUC of 5 and paclitaxel 175 mg/m given every 3 weeks with Neulasta support started on 03/28/2015. Status post 3 cycles, last in 05/16/15    MEDICATIONS: Scheduled Meds: . aspirin  81 mg Oral Daily  . digoxin  0.0625 mg Oral BID  . fluconazole   100 mg Oral Daily  . FLUoxetine  20 mg Oral Daily  . levETIRAcetam  500 mg Oral BID  . metoprolol  25 mg Oral BID  . multivitamin with minerals  1 tablet Oral Daily  . piperacillin-tazobactam (ZOSYN)  IV  3.375 g Intravenous 3 times per day  . sodium chloride  3 mL Intravenous Q12H  . vancomycin  1,250 mg Intravenous Q24H   Continuous Infusions: . 0.9 % NaCl with KCl 40 mEq / L 75 mL/hr (05/23/15 2121)   PRN Meds:.acetaminophen **OR** acetaminophen, albuterol, alum & mag hydroxide-simeth, guaiFENesin-dextromethorphan, ondansetron **OR** ondansetron (ZOFRAN) IV, oxyCODONE, polyethylene glycol, sodium chloride ALLERGIES:  No Known Allergies   PHYSICAL EXAMINATION:  Filed Vitals:   05/24/15 0409  BP: 115/55  Pulse: 97  Temp: 98.6 F (37 C)  Resp: 18   Filed Weights   05/23/15 1849 05/24/15 0409  Weight: 231 lb 11.3 oz (105.1 kg) 234 lb 9.1 oz (106.4 kg)    GENERAL:alert, no distress and ill appearing SKIN: skin color, texture, turgor are dry, no rashes or significant lesions EYES: normal, conjunctiva are pink and non-injected, sclera clear OROPHARYNX:no exudate, no erythema and lips, buccal mucosa, and tongue normal  NECK: supple, thyroid normal size, non-tender, without nodularity LYMPH:  no palpable lymphadenopathy in the cervical, axillary or inguinal LUNGS: clear to auscultation and percussion with normal breathing effort HEART: irregular rate & rhythm and no murmurs and no lower extremity edema. Right port normal ABDOMEN:obese, soft, non-tender and normal bowel sounds Musculoskeletal:no cyanosis of digits and no clubbing  PSYCH: alert & oriented x 3 with fluent speech NEURO: no focal motor/sensory  deficits   LABORATORY/RADIOLOGY DATA:   Recent Labs Lab 05/23/15 1313 05/24/15 0630  WBC 0.7* 1.0*  HGB 8.8* 7.8*  HCT 25.6* 22.7*  PLT 88* 69*  MCV 90.5 91.5  MCH 31.1 31.5  MCHC 34.4 34.4  RDW 16.1* 16.5*  LYMPHSABS 0.3*  --   MONOABS 0.2  --   EOSABS 0.0   --   BASOSABS 0.0  --     CMP    Recent Labs Lab 05/23/15 1313 05/23/15 2000  NA 135  --   K 3.0*  --   CL 100*  --   CO2 24  --   GLUCOSE 112*  --   BUN 28*  --   CREATININE 1.31*  --   CALCIUM 9.1  --   AST  --  29  ALT  --  39  ALKPHOS  --  78  BILITOT  --  2.0*        Component Value Date/Time   BILITOT 2.0* 05/23/2015 2000   BILITOT 0.58 05/16/2015 1130   BILIDIR 0.5 05/23/2015 2000   IBILI 1.5* 05/23/2015 2000      Urinalysis    Component Value Date/Time   COLORURINE YELLOW 05/23/2015 1247   APPEARANCEUR CLEAR 05/23/2015 1247   LABSPEC 1.013 05/23/2015 1247   LABSPEC 1.010 05/16/2015 1130   PHURINE 6.5 05/23/2015 1247   PHURINE 6.0 05/16/2015 1130   GLUCOSEU NEGATIVE 05/23/2015 1247   GLUCOSEU Negative 05/16/2015 1130   HGBUR NEGATIVE 05/23/2015 1247   HGBUR Negative 05/16/2015 1130   BILIRUBINUR NEGATIVE 05/23/2015 1247   BILIRUBINUR Negative 05/16/2015 1130   KETONESUR NEGATIVE 05/23/2015 1247   KETONESUR Negative 05/16/2015 1130   PROTEINUR NEGATIVE 05/23/2015 1247   PROTEINUR Negative 05/16/2015 1130   UROBILINOGEN 0.2 05/23/2015 1247   UROBILINOGEN 0.2 05/16/2015 1130   NITRITE NEGATIVE 05/23/2015 1247   NITRITE Negative 05/16/2015 1130   LEUKOCYTESUR NEGATIVE 05/23/2015 1247   LEUKOCYTESUR Trace 05/16/2015 1130    Drugs of Abuse  No results found for: LABOPIA, COCAINSCRNUR, LABBENZ, AMPHETMU, THCU, LABBARB   Liver Function Tests:  Recent Labs Lab 05/23/15 2000  AST 29  ALT 39  ALKPHOS 78  BILITOT 2.0*  PROT 6.4*  ALBUMIN 3.2*   D-Dimer  Recent Labs  05/23/15 1313  DDIMER 2.54*    Radiology Studies:  Dg Chest 2 View  05/23/2015   CLINICAL DATA:  Short of breath. Diarrhea. RIGHT-sided lung cancer. Left-sided chest pain. Weakness.  EXAM: CHEST  2 VIEW  COMPARISON:  Multiple priors dating back to 09/07/2014.  FINDINGS: Cardiopericardial silhouette enlarged. Median sternotomy. Monitoring leads project over the chest.  RIGHT IJ power port with the tip in the lower superior vena cava.  There is mild apical lordotic projection on today's chest radiograph. When compared to 05/11/2014, the opacity in the RIGHT mid lung continues to be more prominent than on prior chest radiographs, suggesting progression of tumor in the RIGHT lung. This could also represent radiation changes if patient is undergoing radiation therapy.  IMPRESSION: Short term stability of the RIGHT chest nodule with surrounding airspace opacity. No definite acute cardiopulmonary disease on today's chest radiograph. Chronic cardiomegaly without failure.   Electronically Signed   By: Alejandro [name] M.D.   On: 05/23/2015 13:44   Ct Head Wo Contrast  05/23/2015   CLINICAL DATA:  Shortness of breath and chest pain. Increasing weakness. History of brain surgery.  EXAM: CT HEAD WITHOUT CONTRAST  TECHNIQUE: Contiguous axial images were obtained from the base of  the skull through the vertex without intravenous contrast.  COMPARISON:  06/01/2014 head CT  FINDINGS: Skull and Sinuses:A right craniotomy bone flap has been replaced. The operative site is unremarkable.  No sinus or mastoid effusion.  Orbits: No acute abnormality.  Brain: No evidence of acute infarction, hemorrhage, hydrocephalus, or mass lesion/mass effect.  Expected evolution of low-density in the right temporal lobe, now with circumscribed margins and negative mass effect, consistent with gliosis. A parenchymal hematoma was noted in this region on 2015 imaging.  Stable pattern of extensive chronic small vessel disease with patchy ischemic gliosis throughout the bilateral cerebral white matter. There have been numerous remote bilateral cerebellar infarcts.  IMPRESSION: 1. No acute findings. 2. 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 3. Posttraumatic right temporal lobe gliosis.   Electronically Signed   By: Alejandro [name] M.D.   On: 05/23/2015 14:00   Ct Angio  Chest Pe W/cm &/or Wo Cm  05/23/2015   CLINICAL DATA:  Left-sided chest pain and short of breath. Elevated D-dimer. Non-small cell lung cancer diagnosis January 2016. Chemotherapy in progress. Radiation therapy complete.  EXAM: CT ANGIOGRAPHY CHEST WITH CONTRAST  TECHNIQUE: Multidetector CT imaging of the chest was performed using the standard protocol during bolus administration of intravenous contrast. Multiplanar CT image reconstructions and MIPs were obtained to evaluate the vascular anatomy.  CONTRAST:  29m OMNIPAQUE IOHEXOL 350 MG/ML SOLN  COMPARISON:  Chest CT 03/11/2015  FINDINGS: Mediastinum/Nodes: No filling defects within the pulmonary arteries to suggest acute pulmonary embolism. No acute findings of the aorta great vessels.  No mediastinal lymphadenopathy. Right lower paratracheal lymph node measures 10 mm short axis decreased from 19 on prior. No pericardial fluid. Post CABG anatomy  Lungs/Pleura: There is patchy foci of could type consolidation with air bronchograms in the right upper lobe, right middle lobe and superior segment of the right lower lobe. There is central para bronchovascular thickening extending from the right hilum. Left lung is clear.  Upper abdomen: Limited view of the liver, kidneys, pancreas are unremarkable. Normal adrenal glands.  Musculoskeletal: No acute osseous abnormality.  Review of the MIP images confirms the above findings.  IMPRESSION: 1. New patchy consolidation in the right upper lobe, right middle lobe and superior right lower with differential including multifocal pneumonia, aspiration pneumonitis, or delayed postradiation change. Lung cancer recurrence is not favored. Unilateral drug reaction would also be unlikely. 2. No evidence of acute pulmonary embolism.   Electronically Signed   By: Alejandro BouchardM.D.   On: 05/23/2015 15:55       ASSESSMENT AND PLAN:   Non-small cell carcinoma of lung, stage 3 S/p Cycle 3 chemo with Carboplatin and Taxol on  05/16/15 with Neulasta on 05/18/15   HCAP CT angio is negative for PE, infiltrates seen suspicious for pneumonia Cultures pending Continue IV antibiotics, day 2  Anemia Due to recent chemotherapy, dilution, infection, antibiotics No bleeding issues are reported Monitor counts closely Transfuse blood to maintain a Hb of 7 g or if the patient is acutely bleeding  Thrombocytopenia This is due to recent chemo, malignancy, dilution, infection, Keppra, cardiac issues  No bleeding issues are noted Monitor counts closely No transfusion is indicated at this time Transfuse 1 unit of platelets if count is less or equal than 10,000 or 20,000 if the patient is acutely bleeding He is on SCDs   Leukopenia Due to recent chemotherapy, dilution, infection He had Neulasta on 6/15 ANC is 0.2 Would hold Granix for  now, as this value is expected to increase. If does not rise, will consider GCSF  Continue to closely monitor  Possible Seizures Generalized weakness Continue Keppra MRI brain, Lumbar and Thoracic spine MRI pending  Cards Appreciate Cardiology follow up, in the setting of HF, CAF, CAD   DVT prophylaxis On SCDs  Full Code   Other medical issues as per admitting team   [facility] E, PA-C 05/24/2015, 7:05 AM  ADDENDUM: Hematology/Oncology Attending: The patient is seen and examined today. I agree with the above note. This is a very pleasant 69 years old white male with a stage IIIa non-small cell lung cancer status post a course of concurrent chemoradiation and he is currently undergoing consolidation chemotherapy with carboplatin and paclitaxel status post 3 cycles. He has a rough time tolerating his treatment with pancytopenia requiring transfusion as well as growth factor. He was admitted to [facility] complaining of generalized weakness and fatigue as well as chills and worsening dyspnea. CT angiogram of the chest showed new patchy conservation in the right upper lobe  and right middle lobe as well as superior right lower lobe questionable for multifocal pneumonia. There was no evidence for pulmonary embolism. The patient is currently on treatment with Zosyn and vancomycin. He is feeling much better. He was also started on treatment with Granix for the neutropenic fever. He is feeling much better today. He still have MRI of the brain is scheduled for later today for evaluation of questionable seizure and mental status change. I agree with the current care plan for this patient. I will arrange a follow-up appointment for him after discharge for reevaluation. Thank you so much for taking good care of Mr. Sagraves. I will continue to follow up the patient with you and assist in his management an as-needed basis.

## 2015-05-24 NOTE — Progress Notes (Signed)
SUBJECTIVE:  He has no chest pain.  No SOB.     PHYSICAL EXAM Filed Vitals:   05/23/15 1849 05/23/15 2223 05/24/15 0409 05/24/15 0948  BP: 108/51 115/93 115/55 132/74  Pulse: 98 96 97 4  Temp: 98.1 F (36.7 C) 99 F (37.2 C) 98.6 F (37 C)   TempSrc: Oral Oral Oral   Resp: '20 18 18   '$ Height: '5\' 8"'$  (1.727 m)     Weight: 231 lb 11.3 oz (105.1 kg)  234 lb 9.1 oz (106.4 kg)   SpO2: 100% 97% 94%    General:  No distress Lungs:  Clear Heart:  Irregular Abdomen:  Positive bowel sounds, no rebound no guarding Extremities:  No edema  LABS: Lab Results  Component Value Date   TROPONINI 0.06* 05/24/2015   Results for orders placed or performed during the hospital encounter of 05/23/15 (from the past 24 hour(s))  Urinalysis, Routine w reflex microscopic (not at [facility])     Status: None   Collection Time: 05/23/15 12:47 PM  Result Value Ref Range   Color, Urine YELLOW YELLOW   APPearance CLEAR CLEAR   Specific Gravity, Urine 1.013 1.005 - 1.030   pH 6.5 5.0 - 8.0   Glucose, UA NEGATIVE NEGATIVE mg/dL   Hgb urine dipstick NEGATIVE NEGATIVE   Bilirubin Urine NEGATIVE NEGATIVE   Ketones, ur NEGATIVE NEGATIVE mg/dL   Protein, ur NEGATIVE NEGATIVE mg/dL   Urobilinogen, UA 0.2 0.0 - 1.0 mg/dL   Nitrite NEGATIVE NEGATIVE   Leukocytes, UA NEGATIVE NEGATIVE  Urine culture     Status: None (Preliminary result)   Collection Time: 05/23/15 12:47 PM  Result Value Ref Range   Specimen Description URINE, CLEAN CATCH    Special Requests NONE    Culture      CULTURE REINCUBATED FOR BETTER GROWTH Performed at [facility]    Report Status PENDING   CBC with Differential/Platelet     Status: Abnormal   Collection Time: 05/23/15  1:13 PM  Result Value Ref Range   WBC 0.7 (LL) 4.0 - 10.5 K/uL   RBC 2.83 (L) 4.22 - 5.81 MIL/uL   Hemoglobin 8.8 (L) 13.0 - 17.0 g/dL   HCT 25.6 (L) 39.0 - 52.0 %   MCV 90.5 78.0 - 100.0 fL   MCH 31.1 26.0 - 34.0 pg   MCHC 34.4 30.0 - 36.0 g/dL   RDW 16.1 (H) 11.5 - 15.5 %   Platelets 88 (L) 150 - 400 K/uL   Neutrophils Relative % 24 (L) 43 - 77 %   Lymphocytes Relative 47 (H) 12 - 46 %   Monocytes Relative 27 (H) 3 - 12 %   Eosinophils Relative 2 0 - 5 %   Basophils Relative 0 0 - 1 %   Neutro Abs 0.2 (L) 1.7 - 7.7 K/uL   Lymphs Abs 0.3 (L) 0.7 - 4.0 K/uL   Monocytes Absolute 0.2 0.1 - 1.0 K/uL   Eosinophils Absolute 0.0 0.0 - 0.7 K/uL   Basophils Absolute 0.0 0.0 - 0.1 K/uL   WBC Morphology WHITE COUNT CONFIRMED ON SMEAR   Basic metabolic panel     Status: Abnormal   Collection Time: 05/23/15  1:13 PM  Result Value Ref Range   Sodium 135 135 - 145 mmol/L   Potassium 3.0 (L) 3.5 - 5.1 mmol/L   Chloride 100 (L) 101 - 111 mmol/L   CO2 24 22 - 32 mmol/L   Glucose, Bld 112 (H) 65 - 99  mg/dL   BUN 28 (H) 6 - 20 mg/dL   Creatinine, Ser 1.31 (H) 0.61 - 1.24 mg/dL   Calcium 9.1 8.9 - 10.3 mg/dL   GFR calc non Af Amer 54 (L) >60 mL/min   GFR calc Af Amer >60 >60 mL/min   Anion gap 11 5 - 15  Brain natriuretic peptide     Status: Abnormal   Collection Time: 05/23/15  1:13 PM  Result Value Ref Range   B Natriuretic Peptide 141.1 (H) 0.0 - 100.0 pg/mL  D-dimer, quantitative (not at [facility])     Status: Abnormal   Collection Time: 05/23/15  1:13 PM  Result Value Ref Range   D-Dimer, Quant 2.54 (H) 0.00 - 0.48 ug/mL-FEU  I-stat troponin, ED     Status: None   Collection Time: 05/23/15  1:18 PM  Result Value Ref Range   Troponin i, poc 0.06 0.00 - 0.08 ng/mL   Comment 3          Blood culture (routine x 2)     Status: None (Preliminary result)   Collection Time: 05/23/15  5:07 PM  Result Value Ref Range   Specimen Description      BLOOD LEFT ANTECUBITAL Performed at [facility]    Special Requests BOTTLES DRAWN AEROBIC AND ANAEROBIC 5ML    Culture PENDING    Report Status PENDING   Hepatic function panel     Status: Abnormal   Collection Time: 05/23/15  8:00 PM  Result Value Ref Range   Total Protein 6.4 (L) 6.5 -  8.1 g/dL   Albumin 3.2 (L) 3.5 - 5.0 g/dL   AST 29 15 - 41 U/L   ALT 39 17 - 63 U/L   Alkaline Phosphatase 78 38 - 126 U/L   Total Bilirubin 2.0 (H) 0.3 - 1.2 mg/dL   Bilirubin, Direct 0.5 0.1 - 0.5 mg/dL   Indirect Bilirubin 1.5 (H) 0.3 - 0.9 mg/dL  Troponin I (q 6hr x 3)     Status: Abnormal   Collection Time: 05/23/15  8:00 PM  Result Value Ref Range   Troponin I 0.07 (H) <0.031 ng/mL  Troponin I (q 6hr x 3)     Status: Abnormal   Collection Time: 05/24/15 12:22 AM  Result Value Ref Range   Troponin I 0.08 (H) <0.031 ng/mL  Clostridium Difficile by PCR (not at [facility])     Status: None   Collection Time: 05/24/15  6:24 AM  Result Value Ref Range   C difficile by pcr NEGATIVE NEGATIVE  Strep pneumoniae urinary antigen     Status: None   Collection Time: 05/24/15  6:25 AM  Result Value Ref Range   Strep Pneumo Urinary Antigen NEGATIVE NEGATIVE  Lipid panel     Status: Abnormal   Collection Time: 05/24/15  6:30 AM  Result Value Ref Range   Cholesterol 144 0 - 200 mg/dL   Triglycerides 221 (H) <150 mg/dL   HDL 16 (L) >40 mg/dL   Total CHOL/HDL Ratio 9.0 RATIO   VLDL 44 (H) 0 - 40 mg/dL   LDL Cholesterol 84 0 - 99 mg/dL  Troponin I (q 6hr x 3)     Status: Abnormal   Collection Time: 05/24/15  6:30 AM  Result Value Ref Range   Troponin I 0.06 (H) <0.031 ng/mL  Basic metabolic panel     Status: Abnormal   Collection Time: 05/24/15  6:30 AM  Result Value Ref Range   Sodium 136 135 -  145 mmol/L   Potassium 3.1 (L) 3.5 - 5.1 mmol/L   Chloride 102 101 - 111 mmol/L   CO2 23 22 - 32 mmol/L   Glucose, Bld 121 (H) 65 - 99 mg/dL   BUN 25 (H) 6 - 20 mg/dL   Creatinine, Ser 1.25 (H) 0.61 - 1.24 mg/dL   Calcium 8.2 (L) 8.9 - 10.3 mg/dL   GFR calc non Af Amer 57 (L) >60 mL/min   GFR calc Af Amer >60 >60 mL/min   Anion gap 11 5 - 15  CBC     Status: Abnormal   Collection Time: 05/24/15  6:30 AM  Result Value Ref Range   WBC 1.0 (LL) 4.0 - 10.5 K/uL   RBC 2.48 (L) 4.22 - 5.81 MIL/uL     Hemoglobin 7.8 (L) 13.0 - 17.0 g/dL   HCT 22.7 (L) 39.0 - 52.0 %   MCV 91.5 78.0 - 100.0 fL   MCH 31.5 26.0 - 34.0 pg   MCHC 34.4 30.0 - 36.0 g/dL   RDW 16.5 (H) 11.5 - 15.5 %   Platelets 69 (L) 150 - 400 K/uL  Magnesium     Status: Abnormal   Collection Time: 05/24/15  6:30 AM  Result Value Ref Range   Magnesium 1.4 (L) 1.7 - 2.4 mg/dL    Intake/Output Summary (Last 24 hours) at 05/24/15 1227 Last data filed at 05/24/15 1034  Gross per 24 hour  Intake    240 ml  Output    277 ml  Net    -37 ml    ASSESSMENT AND PLAN:  ATRIAL FIB:    Rate controlled.  Not an anticoagulation candidate.  No change in therapy.   CHRONIC SYSTOLIC HF:   Seems to be euvlolemic.  He is getting gentle hydration with his volume loss and increased creat.  No new suggestions.    ELEVATED TROPONIN:  Demand ischemia.  No ischemia work up planned.    Please call with further questions.   Jeneen Rinks [facility] 05/24/2015 12:27 PM

## 2015-05-25 ENCOUNTER — Inpatient Hospital Stay (HOSPITAL_COMMUNITY): Payer: Medicare Other

## 2015-05-25 DIAGNOSIS — J189 Pneumonia, unspecified organism: Principal | ICD-10-CM

## 2015-05-25 DIAGNOSIS — T451X5A Adverse effect of antineoplastic and immunosuppressive drugs, initial encounter: Secondary | ICD-10-CM

## 2015-05-25 DIAGNOSIS — D61810 Antineoplastic chemotherapy induced pancytopenia: Secondary | ICD-10-CM

## 2015-05-25 DIAGNOSIS — R7881 Bacteremia: Secondary | ICD-10-CM

## 2015-05-25 LAB — CBC WITH DIFFERENTIAL/PLATELET
Basophils Absolute: 0 10*3/uL (ref 0.0–0.1)
Basophils Relative: 0 % (ref 0–1)
EOS ABS: 0 10*3/uL (ref 0.0–0.7)
Eosinophils Relative: 1 % (ref 0–5)
HCT: 22 % — ABNORMAL LOW (ref 39.0–52.0)
Hemoglobin: 7.3 g/dL — ABNORMAL LOW (ref 13.0–17.0)
Lymphocytes Relative: 15 % (ref 12–46)
Lymphs Abs: 0.5 10*3/uL — ABNORMAL LOW (ref 0.7–4.0)
MCH: 30.4 pg (ref 26.0–34.0)
MCHC: 33.2 g/dL (ref 30.0–36.0)
MCV: 91.7 fL (ref 78.0–100.0)
MONO ABS: 0.5 10*3/uL (ref 0.1–1.0)
MONOS PCT: 16 % — AB (ref 3–12)
Neutro Abs: 2.4 10*3/uL (ref 1.7–7.7)
Neutrophils Relative %: 68 % (ref 43–77)
PLATELETS: 69 10*3/uL — AB (ref 150–400)
RBC: 2.4 MIL/uL — ABNORMAL LOW (ref 4.22–5.81)
RDW: 16.6 % — ABNORMAL HIGH (ref 11.5–15.5)
WBC: 3.4 10*3/uL — AB (ref 4.0–10.5)

## 2015-05-25 LAB — URINALYSIS, ROUTINE W REFLEX MICROSCOPIC
Bilirubin Urine: NEGATIVE
Glucose, UA: NEGATIVE mg/dL
KETONES UR: NEGATIVE mg/dL
Leukocytes, UA: NEGATIVE
Nitrite: NEGATIVE
PH: 6.5 (ref 5.0–8.0)
Protein, ur: NEGATIVE mg/dL
Specific Gravity, Urine: 1.023 (ref 1.005–1.030)
Urobilinogen, UA: 0.2 mg/dL (ref 0.0–1.0)

## 2015-05-25 LAB — LEGIONELLA ANTIGEN, URINE

## 2015-05-25 LAB — URINE MICROSCOPIC-ADD ON

## 2015-05-25 LAB — BASIC METABOLIC PANEL
ANION GAP: 6 (ref 5–15)
BUN: 20 mg/dL (ref 6–20)
CO2: 24 mmol/L (ref 22–32)
Calcium: 8.4 mg/dL — ABNORMAL LOW (ref 8.9–10.3)
Chloride: 108 mmol/L (ref 101–111)
Creatinine, Ser: 1.15 mg/dL (ref 0.61–1.24)
GFR calc Af Amer: >60 mL/min (ref >60)
GFR calc non Af Amer: >60 mL/min (ref >60)
GLUCOSE: 104 mg/dL — AB (ref 65–99)
Potassium: 3.8 mmol/L (ref 3.5–5.1)
SODIUM: 138 mmol/L (ref 135–145)

## 2015-05-25 LAB — DIGOXIN LEVEL: Digoxin Level: 0.6 ng/mL — ABNORMAL LOW (ref 0.8–2.0)

## 2015-05-25 LAB — MAGNESIUM: MAGNESIUM: 2.4 mg/dL (ref 1.7–2.4)

## 2015-05-25 MED ORDER — MORPHINE SULFATE 2 MG/ML IJ SOLN
2.0000 mg | Freq: Once | INTRAMUSCULAR | Status: AC
  Administered 2015-05-25: 2 mg via INTRAVENOUS
  Filled 2015-05-25: qty 1

## 2015-05-25 MED ORDER — METOPROLOL TARTRATE 25 MG PO TABS
25.0000 mg | ORAL_TABLET | Freq: Two times a day (BID) | ORAL | Status: DC
  Administered 2015-05-25 – 2015-05-29 (×8): 25 mg via ORAL
  Filled 2015-05-25 (×8): qty 1

## 2015-05-25 MED ORDER — METOPROLOL TARTRATE 25 MG PO TABS: 12.5000 mg | ORAL_TABLET | Freq: Two times a day (BID) | ORAL | Status: DC

## 2015-05-25 MED ORDER — GADOBENATE DIMEGLUMINE 529 MG/ML IV SOLN
20.0000 mL | Freq: Once | INTRAVENOUS | Status: AC | PRN
Start: 2015-05-25 — End: 2015-05-25
  Administered 2015-05-25: 20 mL via INTRAVENOUS

## 2015-05-25 NOTE — Progress Notes (Signed)
CRITICAL VALUE ALERT  Critical value received:  Gram negative rods in blood culture  Date of notification:  05/25/15  Time of notification:  1400  Critical value read back:Yes.    Nurse who received alert:  Catie Despina Pole  MD notified (1st page):  Wyline Copas  Time of first page:  1402  MD notified (2nd page):  Time of second page:  Responding MD:  Wyline Copas   Time MD responded:  1415

## 2015-05-25 NOTE — Progress Notes (Signed)
Tele reported a 3.2 sec pause at 2305. EKG and vitals were taken. Provider was paged.

## 2015-05-25 NOTE — Progress Notes (Signed)
Bladder scan performed per MD order. Greater than 900cc showed on scan. MD order to insert Foley if bladder scan was greater than 300cc. 47F Foley inserted with return of urine. 1750cc immediately emptied. Pt tolerated well. Callie Fielding RN

## 2015-05-25 NOTE — Progress Notes (Addendum)
[facility] PROGRESS NOTE  Alejandro [name] XBL:[id number] DOB: 06/25/1946 DOA: 05/23/2015 PCP: [name], MD  Assessment/Plan: Principal Problem:  HCAP (healthcare-associated pneumonia) / Gm neg bacteremia - legionella neg, strep pnemo neg -HIV NR - Blood culture now pos for 1/2 Gm neg rods. Follow speciation - Pt is continued on empiric vancomycin/Zosyn.    Questionable seizure / weakness  - Patient denies ever having a confirmed seizure diagnosis but is on Keppra. - Tolerating Keppra. - EEG done with findings suggestive of hx of seizures - MRI of the brain unremarkable for acute changes - lumbar and thoracic spine unremarkable, although evidence of severe bladder outlet obstruction noted on lumbar MRI (see below)   Dyslipidemia - Not currently on statin therapy - LDL of 84   Coronary atherosclerosis of native coronary artery / Chest pain / dyspnea / demand ischemia - Continue on aspirin and beta blocker. - Seen by cardiology. - CT of the chest negative for pulmonary embolism. -Troponins mildly elevated, likely secondary to demand ischemia in the setting of pneumonia. - No further ischemic workup planned by cardiology.   Chronic systolic congestive heart failure, NYHA class 3 - Appears compensated. Hold Lasix and gently hydrate. Monitor closely for signs of volume overload.  - Daily weights.    COPD mixed type - No evidence of acute exacerbation. Continue bronchodilation as is needed.   Non-small cell carcinoma of lung, stage 3 - Status post radiation therapy. Currently receiving chemotherapy with Taxol and carboplatin, last given 05/16/15. - Dr. Julien Nordmann notified through [place] of the patient's admission.   Antineoplastic chemotherapy induced pancytopenia / neutropenia - Receive Neulasta on 05/18/15. WBC slightly improved this morning. Given Granix, will now d/c as ANC > 1000. - Cont to transfuse for hemoglobin less than 7, currently 7.8 mg/dL. - Platelet count  stable with no signs of bleeding.   Atrial fibrillation, chronic - Continued digoxin and beta blocker. Continue aspirin.  - Not a candidate for anticoagulation secondary to high fall risk. - Pt noted to be bradycardic overnight while asleep with short run of Vtach noted - Discussed case with on-call Cardiologist who recommends continuing home dose of beta blocker ('25mg'$  bid)   Hypokalemia / hypomagnesemia - Started potassium 20 mEq orally twice a day and continue potassium in IV fluids.  - K has been replaced   DVT prophylaxis - SCDs only given thrombocytopenia.  Suspected bladder outlet obstruction - Lumbar MRI incidentally demonstrates enlarged bladder worrisome for bladder outlet obstruction - Will check bladder US and plan for foley cath if >300cc urine on scan  Code Status: Full Family Communication: Pt in room Disposition Plan: Pending   Consultants:  Cardiology  Procedures:    Antibiotics:  Vancomycin 05/23/15--->  Zosyn 05/23/15--->   HPI/Subjective: States feeling better.  Objective: Filed Vitals:   05/24/15 2015 05/24/15 2339 05/25/15 0126 05/25/15 0419  BP: 144/65 132/50 120/50 121/98  Pulse: 86 82 80 78  Temp: 97.7 F (36.5 C) 97.8 F (36.6 C)  98.2 F (36.8 C)  TempSrc: Oral Oral  Oral  Resp: '19 22  19  '$ Height:      Weight:    107.7 kg (237 lb 7 oz)  SpO2: 97% 100% 99% 93%    Intake/Output Summary (Last 24 hours) at 05/25/15 1604 Last data filed at 05/25/15 1439  Gross per 24 hour  Intake   1750 ml  Output      0 ml  Net   1750 ml   [employer]  05/23/15 1849 05/24/15 0409 05/25/15 0419  Weight: 105.1 kg (231 lb 11.3 oz) 106.4 kg (234 lb 9.1 oz) 107.7 kg (237 lb 7 oz)    Exam:   General:  Awake, in nad  Cardiovascular: regular, s1, s2  Respiratory: normal resp effort,no wheezing  Abdomen: soft,nondistended  Musculoskeletal: perfused, no clubbing   Data Reviewed: Basic Metabolic Panel:  Recent Labs Lab  05/23/15 1313 05/24/15 0630 05/25/15 0440  NA 135 136 138  K 3.0* 3.1* 3.8  CL 100* 102 108  CO2 '24 23 24  '$ GLUCOSE 112* 121* 104*  BUN 28* 25* 20  CREATININE 1.31* 1.25* 1.15  CALCIUM 9.1 8.2* 8.4*  MG  --  1.4* 2.4   Liver Function Tests:  Recent Labs Lab 05/23/15 2000  AST 29  ALT 39  ALKPHOS 78  BILITOT 2.0*  PROT 6.4*  ALBUMIN 3.2*   No results for input(s): LIPASE, AMYLASE in the last 168 hours. No results for input(s): AMMONIA in the last 168 hours. CBC:  Recent Labs Lab 05/23/15 1313 05/24/15 0630 05/25/15 0440  WBC 0.7* 1.0* 3.4*  NEUTROABS 0.2*  --  2.4  HGB 8.8* 7.8* 7.3*  HCT 25.6* 22.7* 22.0*  MCV 90.5 91.5 91.7  PLT 88* 69* 69*   Cardiac Enzymes:  Recent Labs Lab 05/23/15 2000 05/24/15 0022 05/24/15 0630  TROPONINI 0.07* 0.08* 0.06*   BNP (last 3 results)  Recent Labs  04/28/15 1300 05/23/15 1313  BNP 293.9* 141.1*    ProBNP (last 3 results)  Recent Labs  03/29/15 1133 04/05/15 1134  PROBNP 543.0* 252.0*    CBG: No results for input(s): GLUCAP in the last 168 hours.  Recent Results (from the past 240 hour(s))  Urine Culture     Status: None   Collection Time: 05/16/15 11:30 AM  Result Value Ref Range Status   Urine Culture, Routine Culture, Urine  Final    Comment: ------------------------------------------------------------------------ ITEMS WERE ATTACHED TO THIS ORDER: CULTURE, URINE Final - ===== COLONY COUNT: ===== 6,000 COLONIES/ML Insignificant Growth   Urine culture     Status: None (Preliminary result)   Collection Time: 05/23/15 12:47 PM  Result Value Ref Range Status   Specimen Description URINE, CLEAN CATCH  Final   Special Requests NONE  Final   Culture   Final    20,000 COLONIES/mL ENTEROCOCCUS SPECIES Performed at [facility]    Report Status PENDING  Incomplete  Blood culture (routine x 2)     Status: None (Preliminary result)   Collection Time: 05/23/15  4:44 PM  Result Value Ref Range  Status   Specimen Description BLOOD RIGHT PORTA CATH  Final   Special Requests BOTTLES DRAWN AEROBIC AND ANAEROBIC 5ML  Final   Culture   Final    NO GROWTH 2 DAYS Performed at [facility]    Report Status PENDING  Incomplete  Blood culture (routine x 2)     Status: None (Preliminary result)   Collection Time: 05/23/15  5:07 PM  Result Value Ref Range Status   Specimen Description BLOOD LEFT ANTECUBITAL  Final   Special Requests BOTTLES DRAWN AEROBIC AND ANAEROBIC 5ML  Final   Culture   Final    GRAM NEGATIVE RODS CRITICAL RESULT CALLED TO, READ BACK BY AND VERIFIED WITH: K BEESON 05/25/15 @ 1400 M VESTAL Performed at [facility]    Report Status PENDING  Incomplete  Clostridium Difficile by PCR (not at [facility])     Status: None   Collection  Time: 05/24/15  6:24 AM  Result Value Ref Range Status   C difficile by pcr NEGATIVE NEGATIVE Final     Studies: Mr Brain Wo Contrast  05/24/2015   CLINICAL DATA:  69 year old male with history of non small cell lung cancer presenting with 2 day history of generalized weakness. Possible seizure. History of subdural hematoma and temporal lobe abscess. Subsequent encounter.  EXAM: MRI HEAD WITHOUT CONTRAST  TECHNIQUE: Multiplanar, multiecho pulse sequences of the brain and surrounding structures were obtained without intravenous contrast.  COMPARISON:  05/23/2015 head CT.  08/18/2014 brain MR.  FINDINGS: Exam is motion degraded. Patient was not able to complete full sequences.  No acute infarct.  Remote cerebellar infarcts.  Prior right temporal craniotomy. Encephalomalacia right temporal lobe and dilated right temporal horn relatively similar to prior exam. Within the right temporal lobe, 1.4 x 0.8 x 0.5 cm fluid collection. This was at the level where patient had a prior abscess. This appears slightly larger than on the 08/18/2014 examination when this measured 1.4 x 0.7 x 0.4 cm. No restricted motion at this level as may be seen with an  abscess.  Significant white matter type changes suggestive of result of small vessel disease and/ or treatment of tumor. No obvious findings of intracranial metastatic disease although with the degree of motion lack of contrast, evaluation is limited.  Global atrophy without hydrocephalus.  Major intracranial vascular structures are patent.  Cervical medullary junction, pituitary region, pineal region and orbital structures unremarkable.  IMPRESSION: Exam is motion degraded. Patient was not able to complete full sequences.  No acute infarct.  Remote cerebellar infarcts. Prominent small vessel disease type changes.  Prior right temporal craniotomy for treatment of abscess. No definitive findings of intracranial abscess noted at the current time. Small fluid collection superior right temporal is minimally larger than on prior exam. If there are progressive mental status changes, close follow-up imaging with attention to the region recommended.  No obvious findings of intracranial metastatic disease although with lack of contrast, evaluation is limited.  Global atrophy without hydrocephalus.   Electronically Signed   By: Genia Del M.D.   On: 05/24/2015 16:16   Mr Thoracic Spine Wo Contrast  05/24/2015   CLINICAL DATA:  New bowel and bladder incontinence. Weakness. Stage III non-small cell carcinoma of the lung.  EXAM: MRI THORACIC SPINE WITHOUT CONTRAST  TECHNIQUE: Multiplanar, multisequence MR imaging of the thoracic spine was performed. No intravenous contrast was administered.  COMPARISON:  CT scan of the chest dated 05/23/2015  FINDINGS: The thoracic spinal cord appears normal including the tip of the conus at T12-L1. There is no spinal or foraminal stenosis. No facet arthritis. Chronic accentuation of the thoracic kyphosis with narrowing of the disc spaces from T5 sixth through at T9-10 with no disc protrusions or disc bulges. No mass lesions. No metastatic disease. Paraspinal soft tissues are normal.   IMPRESSION: No significant abnormality of the thoracic spine. Chronic accentuation of the thoracic kyphosis with degenerative changes of the discs in the mid thoracic spine.   Electronically Signed   By: Lorriane Shire M.D.   On: 05/24/2015 16:04   Mr Lumbar Spine W Wo Contrast  05/25/2015   CLINICAL DATA:  69 year old male with non-small cell lung cancer. Two day history of generalized weakness, possible seizure. Lumbar back pain. Subsequent encounter.  EXAM: MRI LUMBAR SPINE WITHOUT AND WITH CONTRAST  TECHNIQUE: Multiplanar and multiecho pulse sequences of the lumbar spine were obtained without and with intravenous contrast.  CONTRAST:  40m MULTIHANCE GADOBENATE DIMEGLUMINE 529 MG/ML IV SOLN  COMPARISON:  Thoracic spine MRI 05/24/2015. Chest CTA 05/23/2015. CT Abdomen and Pelvis 01/28/2013. PET-CT 10/27/2014  FINDINGS: Partially visible moderate to severe distension of the bladder (series 4, image 9).  Other Visualized abdominal viscera and paraspinal soft tissues are within normal limits.  Attempting to utilize the same numbering system as on the recent thoracic study, the L5 level is sacralized and there are absent or hypoplastic ribs at T12. Normal bone marrow signal in the visible thoracic and lumbar spine. Possible abnormal sclerotic marrow signal in the left S2 sacral ala (series 3, image 17, series 7, image 17), but no associated marrow edema or enhancement (series 4, image 17).  Visualized lower thoracic spinal cord is normal with conus medularis at T12-L1. No abnormal intradural enhancement.  No lower thoracic spinal stenosis.  T12-L1:  Mild epidural lipomatosis.  L1-L2: Moderate epidural lipomatosis narrowing the lateral aspect of the thecal sac (series 5, image 16). Mild facet hypertrophy with bilateral mild L1 foraminal stenosis.  L2-L3: Mild disc bulge and moderate facet and ligament flavum hypertrophy. Epidural lipomatosis narrowing the thecal sac (series 5, image 21). Mild L2 foraminal stenosis.   L3-L4: Mild to moderate circumferential disc bulge and moderate to severe facet hypertrophy greater on the right. Trace facet joint fluid. Epidural lipomatosis narrowing the thecal sac. Mild L3 foraminal stenosis.  L4-L5: Severe chronic disc space loss and vacuum disc with endplate sclerosis and chronic endplate marrow signal changes. Superimposed epidural lipomatosis effacing CSF the thecal sac at this level. Moderate facet hypertrophy greater on the left. Mild to moderate bilateral L4 foraminal stenosis.  L5-S1: Sacralized. Epidural lipomatosis effacing CSF throughout the L5 and visible sacral spinal canal.  IMPRESSION: 1. Severe bladder distension partially visible. Query bladder outlet obstruction. 2. Suggestion of transitional lumbosacral anatomy with sacralized L5 level. Correlation with radiographs is recommended prior to any operative intervention. 3. No metastatic disease identified in the visualized lumbosacral spine. Possible S2 sclerotic lesion on the left has a benign MRI appearance. 4. 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   Electronically Signed   By: HGenevie AnnM.D.   On: 05/25/2015 15:52    Scheduled Meds: . aspirin  81 mg Oral Daily  . digoxin  0.0625 mg Oral BID  . fluconazole  100 mg Oral Daily  . FLUoxetine  20 mg Oral Daily  . levETIRAcetam  500 mg Oral BID  . metoprolol  12.5 mg Oral BID  . multivitamin with minerals  1 tablet Oral Daily  . piperacillin-tazobactam (ZOSYN)  IV  3.375 g Intravenous 3 times per day  . potassium chloride  20 mEq Oral BID  . sodium chloride  3 mL Intravenous Q12H  . Tbo-Filgrastim  480 mcg Subcutaneous q1800  . vancomycin  1,250 mg Intravenous Q24H   Continuous Infusions: . 0.9 % NaCl with KCl 40 mEq / L 75 mL/hr (05/24/15 2039)    Principal Problem:   HCAP  (healthcare-associated pneumonia) Active Problems:   Dyslipidemia   Coronary atherosclerosis of native coronary artery   Chronic systolic congestive heart failure, NYHA class 3   COPD mixed type   Non-small cell carcinoma of lung, stage 3   Antineoplastic chemotherapy induced pancytopenia   Atrial fibrillation, chronic   Chest pain   Dyspnea   Neutropenia   Hypokalemia   Bacteremia  Hypomagnesemia   Healthcare-associated pneumonia    Traci Plemons, [facility] Pager [phone]. If 7PM-7AM, please contact night-coverage at [email or web address], password [facility] 05/25/2015, 4:04 PM  LOS: 2 days

## 2015-05-25 NOTE — Progress Notes (Signed)
Patient is having episodes of v tach. Last episode was v tach of 9. BP 121/98, pulse 78, O2 sats 93. Patient is resting peacefully.  Provider paged.

## 2015-05-26 LAB — MAGNESIUM: Magnesium: 1.8 mg/dL (ref 1.7–2.4)

## 2015-05-26 LAB — CBC
HCT: 21 % — ABNORMAL LOW (ref 39.0–52.0)
Hemoglobin: 7.1 g/dL — ABNORMAL LOW (ref 13.0–17.0)
MCH: 31.6 pg (ref 26.0–34.0)
MCHC: 33.8 g/dL (ref 30.0–36.0)
MCV: 93.3 fL (ref 78.0–100.0)
PLATELETS: 93 10*3/uL — AB (ref 150–400)
RBC: 2.25 MIL/uL — ABNORMAL LOW (ref 4.22–5.81)
RDW: 16.8 % — AB (ref 11.5–15.5)
WBC: 8.4 10*3/uL (ref 4.0–10.5)

## 2015-05-26 LAB — BASIC METABOLIC PANEL
Anion gap: 6 (ref 5–15)
BUN: 14 mg/dL (ref 6–20)
CALCIUM: 8.4 mg/dL — AB (ref 8.9–10.3)
CO2: 22 mmol/L (ref 22–32)
Chloride: 112 mmol/L — ABNORMAL HIGH (ref 101–111)
Creatinine, Ser: 1.06 mg/dL (ref 0.61–1.24)
GFR calc Af Amer: >60 mL/min (ref >60)
Glucose, Bld: 92 mg/dL (ref 65–99)
POTASSIUM: 4.2 mmol/L (ref 3.5–5.1)
SODIUM: 140 mmol/L (ref 135–145)

## 2015-05-26 MED ORDER — MAGNESIUM SULFATE 2 GM/50ML IV SOLN
2.0000 g | Freq: Once | INTRAVENOUS | Status: AC
  Administered 2015-05-26: 2 g via INTRAVENOUS
  Filled 2015-05-26: qty 50

## 2015-05-26 MED ORDER — CEFTRIAXONE SODIUM IN DEXTROSE 40 MG/ML IV SOLN
2.0000 g | INTRAVENOUS | Status: DC
  Administered 2015-05-26 – 2015-05-27 (×2): 2 g via INTRAVENOUS
  Filled 2015-05-26 (×3): qty 50

## 2015-05-26 MED ORDER — TAMSULOSIN HCL 0.4 MG PO CAPS
0.4000 mg | ORAL_CAPSULE | Freq: Every day | ORAL | Status: DC
  Administered 2015-05-26 – 2015-05-29 (×4): 0.4 mg via ORAL
  Filled 2015-05-26 (×4): qty 1

## 2015-05-26 MED ORDER — DEXTROSE 5 % IV SOLN
500.0000 mg | INTRAVENOUS | Status: DC
  Administered 2015-05-26 – 2015-05-27 (×2): 500 mg via INTRAVENOUS
  Filled 2015-05-26 (×2): qty 500

## 2015-05-26 NOTE — Progress Notes (Signed)
Central telemetry called and reported that pt had an eleven beat run of v-tach.  Pt stated he felt a little funny, but is fine now.  VSS. MD made aware.  Will continue to monitor closely.

## 2015-05-26 NOTE — Progress Notes (Signed)
[facility] PROGRESS NOTE  [name] [name] DOB: 06-02-1946 DOA: 05/23/2015 PCP: [name], MD  Assessment/Plan: Principal Problem:  HCAP (healthcare-associated pneumonia) / Gm neg bacteremia - Evidence of PNA noted on CT chest on admit - legionella neg, strep pnemo neg -HIV NR - Blood culture pos for 1/2 ecoli, multi-drug sensitive - Pt had been continued on empiric vancomycin/Zosyn - Will transition to azithromycin with rocephin based on culture results. If pt remains afebrile, improved, consider transitioning to PO azithro with ceftin   Questionable seizure / weakness  - Patient denies ever having a confirmed seizure diagnosis but is on Keppra. - Tolerating Keppra. - EEG done with findings suggestive of hx of seizures - MRI of the brain unremarkable for acute changes - lumbar and thoracic spine unremarkable, although evidence of severe bladder outlet obstruction noted on lumbar MRI (see below)   Dyslipidemia - Not currently on statin therapy - LDL of 84   Coronary atherosclerosis of native coronary artery / Chest pain / dyspnea / demand ischemia - Continue on aspirin and beta blocker. - Seen by cardiology. - CT of the chest negative for pulmonary embolism. -Troponins mildly elevated, likely secondary to demand ischemia in the setting of pneumonia. - No further ischemic workup planned by cardiology.   Chronic systolic congestive heart failure, NYHA class 3 - Appears compensated. Hold Lasix and gently hydrate. Monitor closely for signs of volume overload.  - Continue daily weights.    COPD mixed type - No evidence of acute exacerbation. Continue bronchodilation as is needed.   Non-small cell carcinoma of lung, stage 3 - Status post radiation therapy. Currently receiving chemotherapy with Taxol and carboplatin, last given 05/16/15. - Dr. Julien Nordmann notified through [employer] of the patient's admission.   Antineoplastic chemotherapy induced pancytopenia /  neutropenia - Receive Neulasta on 05/18/15. WBC improved. Patient had received Granix with ANC > 1000 and Granix since d/c'd - Cont to transfuse for hemoglobin less than 7, currently 7.8 mg/dL. - Platelet count stable with no signs of bleeding.   Atrial fibrillation, chronic - Continued digoxin and beta blocker. Continue aspirin.  - Not a candidate for anticoagulation secondary to high fall risk. - Pt recently noted to be bradycardic overnight while asleep with short run of Vtach noted - Discussed case with on-call Cardiologist reviewed rhythm strip and recommended continuing home dose of beta blocker ('25mg'$  bid)   Hypokalemia / hypomagnesemia - Started potassium 20 mEq orally twice a day and continue potassium in IV fluids.  - K has been replaced   DVT prophylaxis - SCDs only given thrombocytopenia.  Suspected bladder outlet obstruction - Lumbar MRI incidentally demonstrates enlarged bladder worrisome for bladder outlet obstruction - Bladder scan with over 900cc urine and 1750 urine out when foley placed - Flomax started - Would plan for voiding trial in AM. If continues to retain urine, would recommend Urology follow up  Code Status: Full Family Communication: Pt in room Disposition Plan: Pending   Consultants:  Cardiology  Procedures:    Antibiotics:  Vancomycin 05/23/15--->6/23  Zosyn 05/23/15---> 6/23  Azithromycin 6/23>>>  Rocephin 6/23>>>  HPI/Subjective: Reports feeling overall better today. Less abd fullness  Objective: Filed Vitals:   05/25/15 2033 05/25/15 2220 05/26/15 0500 05/26/15 1347  BP: 136/53 137/72 126/53 113/50  Pulse: 79 98 86 69  Temp: 97.8 F (36.6 C)  97.7 F (36.5 C) 97.4 F (36.3 C)  TempSrc: Oral  Oral Oral  Resp: '18  18 24  '$ Height:  Weight:   107.2 kg (236 lb 5.3 oz)   SpO2: 99%  98% 98%    Intake/Output Summary (Last 24 hours) at 05/26/15 1651 Last data filed at 05/26/15 1317  Gross per 24 hour  Intake 2008.75 ml   Output   2450 ml  Net -441.25 ml   Filed Weights   05/24/15 0409 05/25/15 0419 05/26/15 0500  Weight: 106.4 kg (234 lb 9.1 oz) 107.7 kg (237 lb 7 oz) 107.2 kg (236 lb 5.3 oz)    Exam:   General:  Awake, laying in bed, in nad  Cardiovascular: regular, s1, s2  Respiratory: normal resp effort,no wheezing  Abdomen: soft,nondistended, pos BS  Musculoskeletal: perfused, no clubbing   Data Reviewed: Basic Metabolic Panel:  Recent Labs Lab 05/23/15 1313 05/24/15 0630 05/25/15 0440 05/26/15 0525  NA 135 136 138 140  K 3.0* 3.1* 3.8 4.2  CL 100* 102 108 112*  CO2 '24 23 24 22  '$ GLUCOSE 112* 121* 104* 92  BUN 28* 25* 20 14  CREATININE 1.31* 1.25* 1.15 1.06  CALCIUM 9.1 8.2* 8.4* 8.4*  MG  --  1.4* 2.4 1.8   Liver Function Tests:  Recent Labs Lab 05/23/15 2000  AST 29  ALT 39  ALKPHOS 78  BILITOT 2.0*  PROT 6.4*  ALBUMIN 3.2*   No results for input(s): LIPASE, AMYLASE in the last 168 hours. No results for input(s): AMMONIA in the last 168 hours. CBC:  Recent Labs Lab 05/23/15 1313 05/24/15 0630 05/25/15 0440 05/26/15 0525  WBC 0.7* 1.0* 3.4* 8.4  NEUTROABS 0.2*  --  2.4  --   HGB 8.8* 7.8* 7.3* 7.1*  HCT 25.6* 22.7* 22.0* 21.0*  MCV 90.5 91.5 91.7 93.3  PLT 88* 69* 69* 93*   Cardiac Enzymes:  Recent Labs Lab 05/23/15 2000 05/24/15 0022 05/24/15 0630  TROPONINI 0.07* 0.08* 0.06*   BNP (last 3 results)  Recent Labs  04/28/15 1300 05/23/15 1313  BNP 293.9* 141.1*    ProBNP (last 3 results)  Recent Labs  03/29/15 1133 04/05/15 1134  PROBNP 543.0* 252.0*    CBG: No results for input(s): GLUCAP in the last 168 hours.  Recent Results (from the past 240 hour(s))  Urine culture     Status: None (Preliminary result)   Collection Time: 05/23/15 12:47 PM  Result Value Ref Range Status   Specimen Description URINE, CLEAN CATCH  Final   Special Requests NONE  Final   Culture   Final    20,000 COLONIES/mL ENTEROCOCCUS SPECIES Performed  at [facility]    Report Status PENDING  Incomplete  Blood culture (routine x 2)     Status: None (Preliminary result)   Collection Time: 05/23/15  4:44 PM  Result Value Ref Range Status   Specimen Description BLOOD RIGHT PORTA CATH  Final   Special Requests BOTTLES DRAWN AEROBIC AND ANAEROBIC 5ML  Final   Culture   Final    NO GROWTH 3 DAYS Performed at [facility]    Report Status PENDING  Incomplete  Blood culture (routine x 2)     Status: None (Preliminary result)   Collection Time: 05/23/15  5:07 PM  Result Value Ref Range Status   Specimen Description BLOOD LEFT ANTECUBITAL  Final   Special Requests BOTTLES DRAWN AEROBIC AND ANAEROBIC 5ML  Final   Culture   Final    ESCHERICHIA COLI CRITICAL RESULT CALLED TO, READ BACK BY AND VERIFIED WITH: K BEESON 05/25/15 @ 1400 M VESTAL Performed at  [facility]    Report Status PENDING  Incomplete   Organism ID, Bacteria ESCHERICHIA COLI  Final      Susceptibility   Escherichia coli - MIC*    AMPICILLIN >=32 RESISTANT Resistant     CEFAZOLIN <=4 SENSITIVE Sensitive     CEFEPIME <=1 SENSITIVE Sensitive     CEFTAZIDIME <=1 SENSITIVE Sensitive     CEFTRIAXONE <=1 SENSITIVE Sensitive     CIPROFLOXACIN <=0.25 SENSITIVE Sensitive     GENTAMICIN <=1 SENSITIVE Sensitive     IMIPENEM <=0.25 SENSITIVE Sensitive     TRIMETH/SULFA >=320 RESISTANT Resistant     AMPICILLIN/SULBACTAM 8 SENSITIVE Sensitive     PIP/TAZO <=4 SENSITIVE Sensitive     * ESCHERICHIA COLI  Clostridium Difficile by PCR (not at [facility])     Status: None   Collection Time: 05/24/15  6:24 AM  Result Value Ref Range Status   C difficile by pcr NEGATIVE NEGATIVE Final     Studies: Mr Lumbar Spine W Wo Contrast  05/25/2015   CLINICAL DATA:  69 year old male with non-small cell lung cancer. Two day history of generalized weakness, possible seizure. Lumbar back pain. Subsequent encounter.  EXAM: MRI LUMBAR SPINE WITHOUT AND WITH CONTRAST  TECHNIQUE:  Multiplanar and multiecho pulse sequences of the lumbar spine were obtained without and with intravenous contrast.  CONTRAST:  67m MULTIHANCE GADOBENATE DIMEGLUMINE 529 MG/ML IV SOLN  COMPARISON:  Thoracic spine MRI 05/24/2015. Chest CTA 05/23/2015. CT Abdomen and Pelvis 01/28/2013. PET-CT 10/27/2014  FINDINGS: Partially visible moderate to severe distension of the bladder (series 4, image 9).  Other Visualized abdominal viscera and paraspinal soft tissues are within normal limits.  Attempting to utilize the same numbering system as on the recent thoracic study, the L5 level is sacralized and there are absent or hypoplastic ribs at T12. Normal bone marrow signal in the visible thoracic and lumbar spine. Possible abnormal sclerotic marrow signal in the left S2 sacral ala (series 3, image 17, series 7, image 17), but no associated marrow edema or enhancement (series 4, image 17).  Visualized lower thoracic spinal cord is normal with conus medularis at T12-L1. No abnormal intradural enhancement.  No lower thoracic spinal stenosis.  T12-L1:  Mild epidural lipomatosis.  L1-L2: Moderate epidural lipomatosis narrowing the lateral aspect of the thecal sac (series 5, image 16). Mild facet hypertrophy with bilateral mild L1 foraminal stenosis.  L2-L3: Mild disc bulge and moderate facet and ligament flavum hypertrophy. Epidural lipomatosis narrowing the thecal sac (series 5, image 21). Mild L2 foraminal stenosis.  L3-L4: Mild to moderate circumferential disc bulge and moderate to severe facet hypertrophy greater on the right. Trace facet joint fluid. Epidural lipomatosis narrowing the thecal sac. Mild L3 foraminal stenosis.  L4-L5: Severe chronic disc space loss and vacuum disc with endplate sclerosis and chronic endplate marrow signal changes. Superimposed epidural lipomatosis effacing CSF the thecal sac at this level. Moderate facet hypertrophy greater on the left. Mild to moderate bilateral L4 foraminal stenosis.  L5-S1:  Sacralized. Epidural lipomatosis effacing CSF throughout the L5 and visible sacral spinal canal.  IMPRESSION: 1. Severe bladder distension partially visible. Query bladder outlet obstruction. 2. Suggestion of transitional lumbosacral anatomy with sacralized L5 level. Correlation with radiographs is recommended prior to any operative intervention. 3. No metastatic disease identified in the visualized lumbosacral spine. Possible S2 sclerotic lesion on the left has a benign MRI appearance. 4. 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   Electronically Signed   By: Genevie Ann M.D.   On: 05/25/2015 15:52    Scheduled Meds: . aspirin  81 mg Oral Daily  . digoxin  0.0625 mg Oral BID  . fluconazole  100 mg Oral Daily  . FLUoxetine  20 mg Oral Daily  . levETIRAcetam  500 mg Oral BID  . metoprolol  25 mg Oral BID  . multivitamin with minerals  1 tablet Oral Daily  . piperacillin-tazobactam (ZOSYN)  IV  3.375 g Intravenous 3 times per day  . potassium chloride  20 mEq Oral BID  . sodium chloride  3 mL Intravenous Q12H  . tamsulosin  0.4 mg Oral Daily  . vancomycin  1,250 mg Intravenous Q24H   Continuous Infusions: . 0.9 % NaCl with KCl 40 mEq / L 75 mL/hr (05/25/15 2204)    Principal Problem:   HCAP (healthcare-associated pneumonia) Active Problems:   Dyslipidemia   Coronary atherosclerosis of native coronary artery   Chronic systolic congestive heart failure, NYHA class 3   COPD mixed type   Non-small cell carcinoma of lung, stage 3   Antineoplastic chemotherapy induced pancytopenia   Atrial fibrillation, chronic   Chest pain   Dyspnea   Neutropenia   Hypokalemia   Bacteremia   Hypomagnesemia   Healthcare-associated pneumonia    CHIU, STEPHEN K  [facility] Pager [phone]. If 7PM-7AM, please contact  night-coverage at [email or web address], password [facility] 05/26/2015, 4:51 PM  LOS: 3 days

## 2015-05-26 NOTE — Treatment Plan (Signed)
Reviewed rhythm strip with Cardiology over phone. Not Vtach but artifact. Cont to monitor.

## 2015-05-27 LAB — BASIC METABOLIC PANEL
Anion gap: 8 (ref 5–15)
BUN: 10 mg/dL (ref 6–20)
CALCIUM: 8.4 mg/dL — AB (ref 8.9–10.3)
CO2: 20 mmol/L — ABNORMAL LOW (ref 22–32)
Chloride: 108 mmol/L (ref 101–111)
Creatinine, Ser: 1.01 mg/dL (ref 0.61–1.24)
GFR calc Af Amer: >60 mL/min (ref >60)
GFR calc non Af Amer: >60 mL/min (ref >60)
GLUCOSE: 92 mg/dL (ref 65–99)
POTASSIUM: 4.3 mmol/L (ref 3.5–5.1)
Sodium: 136 mmol/L (ref 135–145)

## 2015-05-27 LAB — URINE CULTURE

## 2015-05-27 LAB — CBC
HCT: 19.8 % — ABNORMAL LOW (ref 39.0–52.0)
HEMOGLOBIN: 6.7 g/dL — AB (ref 13.0–17.0)
MCH: 31.3 pg (ref 26.0–34.0)
MCHC: 33.8 g/dL (ref 30.0–36.0)
MCV: 92.5 fL (ref 78.0–100.0)
Platelets: 58 10*3/uL — ABNORMAL LOW (ref 150–400)
RBC: 2.14 MIL/uL — ABNORMAL LOW (ref 4.22–5.81)
RDW: 16.4 % — AB (ref 11.5–15.5)
WBC: 7.8 10*3/uL (ref 4.0–10.5)

## 2015-05-27 LAB — HEMOGLOBIN AND HEMATOCRIT, BLOOD
HEMATOCRIT: 22 % — AB (ref 39.0–52.0)
HEMOGLOBIN: 7.5 g/dL — AB (ref 13.0–17.0)

## 2015-05-27 LAB — OCCULT BLOOD X 1 CARD TO LAB, STOOL: Fecal Occult Bld: NEGATIVE

## 2015-05-27 LAB — PREPARE RBC (CROSSMATCH)

## 2015-05-27 LAB — ABO/RH: ABO/RH(D): O NEG

## 2015-05-27 LAB — MAGNESIUM: MAGNESIUM: 1.9 mg/dL (ref 1.7–2.4)

## 2015-05-27 MED ORDER — SODIUM CHLORIDE 0.9 % IV SOLN
Freq: Once | INTRAVENOUS | Status: AC
  Administered 2015-05-27: 11:00:00 via INTRAVENOUS

## 2015-05-27 MED ORDER — ALTEPLASE 2 MG IJ SOLR
2.0000 mg | Freq: Once | INTRAMUSCULAR | Status: AC
  Administered 2015-05-27: 2 mg
  Filled 2015-05-27: qty 2

## 2015-05-27 NOTE — Progress Notes (Signed)
OT Cancellation Note  Patient Details Name: Alejandro [name] MRN: [id number] DOB: [birth year]   Cancelled Treatment:    Reason Eval/Treat Not Completed: Medical issues which prohibited therapy.  Hgb 6.7 will check back.   Brode Sculley 05/27/2015, 2:22 PM  Lesle Chris, OTR/L [phone] 05/27/2015

## 2015-05-27 NOTE — Progress Notes (Signed)
PT Cancellation Note  Patient Details Name: Alejandro [name] MRN: [id number] DOB: 1946-01-16   Cancelled Treatment:    Reason Eval/Treat Not Completed: Medical issues which prohibited therapy. HGb 6.7-pt scheduled to have transfusion today. Will hold PT at this time. Will check back another time/day. Thanks.    Weston Anna, MPT Pager: [phone]

## 2015-05-27 NOTE — Progress Notes (Signed)
PT Cancellation Note  Patient Details Name: Alejandro [name] MRN: [id number] DOB: 1946-05-03   Cancelled Treatment:    Reason Eval/Treat Not Completed: Attempted PT eval-pt declined to participate today due to not feeling well. Pt agreeable to PT checking back on tomorrow.    Weston Anna, MPT Pager: [phone]

## 2015-05-27 NOTE — Progress Notes (Signed)
Pt unable to void 5 hours after removing foley catheter.  Post void residual showed >260cc's.  Per MD do bladder scan at 2000 and reinsert foley if PVR  >300 cc's.Will continue to monitor closely.

## 2015-05-27 NOTE — Clinical Documentation Improvement (Signed)
  Patient admitted with pneumonia, chemotherapy induced pancytopenia, currently being treated for lung cancer with chemotherapy as an outpatient, last cycle 05/16/15.  WBC on admission 1.0.  Heart rate in the 90's on admission.  1 of 2 blood cultures + for Ecoli.  Urine culture this admission - 20,000 colonies/mL VRE.  Lactic acid and procalcitonin were not drawn this admission.  Please document if a condition below provides greater specificity regarding the patient's admission diagnosis:   - Sepsis (suspected, likely or confirmed), Present on Admission, 2/2 pneumonia and immunocompromised     state  - No Sepsis this admission and the admission diagnosis is Pneumonia as documented  - Other condition  - Unable to clinically determine      Thank You, Erling Conte ,RN Clinical Documentation Specialist:  [facility]

## 2015-05-27 NOTE — Progress Notes (Signed)
[facility]  Patient Status: Active (receiving services up to time of hospitalization)  [facility] is providing the following services: RN and PT  If patient discharges after hours, please call [phone].   Alejandro [name] 05/27/2015, 2:50 PM

## 2015-05-27 NOTE — Progress Notes (Signed)
CRITICAL VALUE ALERT  Critical value received:  Hgb 6.7  Date of notification:  05/27/2015  Time of notification:  0614  Critical value read back:Yes.    Nurse who received alert:  Sabino Gasser, RN  MD notified (1st page):  Tylene Fantasia, NP  Time of first page:  0615  MD notified (2nd page):  Time of second page:  Responding MD:  Tylene Fantasia, NP  Time MD responded:  [phone]

## 2015-05-27 NOTE — Progress Notes (Signed)
[facility] PROGRESS NOTE  Alejandro [name] HQP:[id number] DOB: [birth year] DOA: 05/23/2015 PCP: [name], MD  Assessment/Plan: Principal Problem:  HCAP (healthcare-associated pneumonia) / Gm neg bacteremia - Evidence of PNA noted on CT chest on admit - legionella neg, strep pnemo neg -HIV NR - Blood culture pos for 1/2 ecoli, multi-drug sensitive - Pt had been initially continued on empiric vancomycin/Zosyn - Have transitioned to azithromycin with rocephin based on culture results. - Pt remains afebrile with no leukocytosis   Questionable seizure / weakness  - Patient denies ever having a confirmed seizure diagnosis but is on Keppra. - Tolerating Keppra. - EEG done with findings suggestive of hx of seizures - MRI of the brain unremarkable for acute changes - lumbar and thoracic spine unremarkable, although evidence of severe bladder outlet obstruction noted on lumbar MRI (see below)   Dyslipidemia - Not currently on statin therapy - LDL of 84   Coronary atherosclerosis of native coronary artery / Chest pain / dyspnea / demand ischemia - Continue on aspirin and beta blocker. - Seen by cardiology. - CT of the chest negative for pulmonary embolism. -Troponins mildly elevated, likely secondary to demand ischemia in the setting of pneumonia. - No further ischemic workup planned by cardiology.   Chronic systolic congestive heart failure, NYHA class 3 - Appears compensated. Hold Lasix and gently hydrate. Monitor closely for signs of volume overload.  - Continue daily weights.    COPD mixed type - No evidence of acute exacerbation. Continue bronchodilation as is needed.   Non-small cell carcinoma of lung, stage 3 - Status post radiation therapy. Currently receiving chemotherapy with Taxol and carboplatin, last given 05/16/15. - Dr. Julien Nordmann was notified through [place] of the patient's admission.   Antineoplastic chemotherapy induced pancytopenia / neutropenia - Receive  Neulasta on 05/18/15. WBC improved. Patient had received Granix with ANC > 1000 and Granix since d/c'd - Platelet count stable with no signs of bleeding - Hgb down to 6.7 this AM, one unit of PRBC ordered   Atrial fibrillation, chronic - Continued digoxin and beta blocker. Continue aspirin.  - Not a candidate for anticoagulation secondary to high fall risk. - Pt recently noted to be bradycardic while asleep with short run of Vtach noted - Had discussed case with on-call Cardiologist reviewed rhythm strip and recommended continuing home dose of beta blocker ('25mg'$  bid)   Hypokalemia / hypomagnesemia - On potassium 20 mEq orally twice a day - K normalized. Will d/c potassium in IV fluids.    DVT prophylaxis - SCDs only given thrombocytopenia.  Suspected bladder outlet obstruction - Lumbar MRI incidentally demonstrates enlarged bladder worrisome for bladder outlet obstruction - Bladder scan with over 900cc urine and 1750 urine out when foley placed - Flomax was started - Voiding trial today and recheck bladder scan in afternoon  Code Status: Full Family Communication: Pt in room Disposition Plan: Pending   Consultants:  Cardiology  Procedures:    Antibiotics:  Vancomycin 05/23/15--->6/23  Zosyn 05/23/15---> 6/23  Azithromycin 6/23>>>  Rocephin 6/23>>>  HPI/Subjective: Feels well today. No complaints  Objective: Filed Vitals:   05/26/15 1347 05/26/15 1823 05/26/15 2208 05/27/15 0542  BP: 113/50 119/55 127/68 124/59  Pulse: 69 71 90 91  Temp: 97.4 F (36.3 C)  97.9 F (36.6 C) 97.7 F (36.5 C)  TempSrc: Oral  Oral Oral  Resp: '24  24 23  '$ Height:      Weight:    108.1 kg (238 lb 5.1 oz)  SpO2: 98%  98% 99%    Intake/Output Summary (Last 24 hours) at 05/27/15 1028 Last data filed at 05/27/15 0700  Gross per 24 hour  Intake 2851.25 ml  Output   1125 ml  Net 1726.25 ml   Filed Weights   05/25/15 0419 05/26/15 0500 05/27/15 0542  Weight: 107.7 kg (237  lb 7 oz) 107.2 kg (236 lb 5.3 oz) 108.1 kg (238 lb 5.1 oz)    Exam:   General:  Awake, laying in bed, in nad  Cardiovascular: regular, s1, s2  Respiratory: normal resp effort,no wheezing  Abdomen: soft,nondistended, pos BS  Musculoskeletal: perfused, no clubbing   Data Reviewed: Basic Metabolic Panel:  Recent Labs Lab 05/23/15 1313 05/24/15 0630 05/25/15 0440 05/26/15 0525 05/27/15 0520  NA 135 136 138 140 136  K 3.0* 3.1* 3.8 4.2 4.3  CL 100* 102 108 112* 108  CO2 '24 23 24 22 '$ 20*  GLUCOSE 112* 121* 104* 92 92  BUN 28* 25* '20 14 10  '$ CREATININE 1.31* 1.25* 1.15 1.06 1.01  CALCIUM 9.1 8.2* 8.4* 8.4* 8.4*  MG  --  1.4* 2.4 1.8 1.9   Liver Function Tests:  Recent Labs Lab 05/23/15 2000  AST 29  ALT 39  ALKPHOS 78  BILITOT 2.0*  PROT 6.4*  ALBUMIN 3.2*   No results for input(s): LIPASE, AMYLASE in the last 168 hours. No results for input(s): AMMONIA in the last 168 hours. CBC:  Recent Labs Lab 05/23/15 1313 05/24/15 0630 05/25/15 0440 05/26/15 0525 05/27/15 0520  WBC 0.7* 1.0* 3.4* 8.4 7.8  NEUTROABS 0.2*  --  2.4  --   --   HGB 8.8* 7.8* 7.3* 7.1* 6.7*  HCT 25.6* 22.7* 22.0* 21.0* 19.8*  MCV 90.5 91.5 91.7 93.3 92.5  PLT 88* 69* 69* 93* 58*   Cardiac Enzymes:  Recent Labs Lab 05/23/15 2000 05/24/15 0022 05/24/15 0630  TROPONINI 0.07* 0.08* 0.06*   BNP (last 3 results)  Recent Labs  04/28/15 1300 05/23/15 1313  BNP 293.9* 141.1*    ProBNP (last 3 results)  Recent Labs  03/29/15 1133 04/05/15 1134  PROBNP 543.0* 252.0*    CBG: No results for input(s): GLUCAP in the last 168 hours.  Recent Results (from the past 240 hour(s))  Urine culture     Status: None   Collection Time: 05/23/15 12:47 PM  Result Value Ref Range Status   Specimen Description URINE, CLEAN CATCH  Final   Special Requests NONE  Final   Culture   Final    20,000 COLONIES/mL VANCOMYCIN RESISTANT ENTEROCOCCUS ISOLATED Performed at [facility]     Report Status 05/27/2015 FINAL  Final   Organism ID, Bacteria VANCOMYCIN RESISTANT ENTEROCOCCUS ISOLATED  Final      Susceptibility   Vancomycin resistant enterococcus isolated - MIC*    AMPICILLIN <=2 SENSITIVE Sensitive     LEVOFLOXACIN >=8 RESISTANT Resistant     NITROFURANTOIN <=16 SENSITIVE Sensitive     VANCOMYCIN >=32 RESISTANT Resistant     LINEZOLID 2 SENSITIVE Sensitive     * 20,000 COLONIES/mL VANCOMYCIN RESISTANT ENTEROCOCCUS ISOLATED  Blood culture (routine x 2)     Status: None (Preliminary result)   Collection Time: 05/23/15  4:44 PM  Result Value Ref Range Status   Specimen Description BLOOD RIGHT PORTA CATH  Final   Special Requests BOTTLES DRAWN AEROBIC AND ANAEROBIC 5ML  Final   Culture   Final    NO GROWTH 3 DAYS Performed at [facility]    Report  Status PENDING  Incomplete  Blood culture (routine x 2)     Status: None (Preliminary result)   Collection Time: 05/23/15  5:07 PM  Result Value Ref Range Status   Specimen Description BLOOD LEFT ANTECUBITAL  Final   Special Requests BOTTLES DRAWN AEROBIC AND ANAEROBIC 5ML  Final   Culture   Final    ESCHERICHIA COLI CRITICAL RESULT CALLED TO, READ BACK BY AND VERIFIED WITH: K BEESON 05/25/15 @ 1400 M VESTAL Performed at [facility]    Report Status PENDING  Incomplete   Organism ID, Bacteria ESCHERICHIA COLI  Final      Susceptibility   Escherichia coli - MIC*    AMPICILLIN >=32 RESISTANT Resistant     CEFAZOLIN <=4 SENSITIVE Sensitive     CEFEPIME <=1 SENSITIVE Sensitive     CEFTAZIDIME <=1 SENSITIVE Sensitive     CEFTRIAXONE <=1 SENSITIVE Sensitive     CIPROFLOXACIN <=0.25 SENSITIVE Sensitive     GENTAMICIN <=1 SENSITIVE Sensitive     IMIPENEM <=0.25 SENSITIVE Sensitive     TRIMETH/SULFA >=320 RESISTANT Resistant     AMPICILLIN/SULBACTAM 8 SENSITIVE Sensitive     PIP/TAZO <=4 SENSITIVE Sensitive     * ESCHERICHIA COLI  Clostridium Difficile by PCR (not at [facility])     Status: None    Collection Time: 05/24/15  6:24 AM  Result Value Ref Range Status   C difficile by pcr NEGATIVE NEGATIVE Final     Studies: Mr Lumbar Spine W Wo Contrast  05/25/2015   CLINICAL DATA:  69 year old male with non-small cell lung cancer. Two day history of generalized weakness, possible seizure. Lumbar back pain. Subsequent encounter.  EXAM: MRI LUMBAR SPINE WITHOUT AND WITH CONTRAST  TECHNIQUE: Multiplanar and multiecho pulse sequences of the lumbar spine were obtained without and with intravenous contrast.  CONTRAST:  6m MULTIHANCE GADOBENATE DIMEGLUMINE 529 MG/ML IV SOLN  COMPARISON:  Thoracic spine MRI 05/24/2015. Chest CTA 05/23/2015. CT Abdomen and Pelvis 01/28/2013. PET-CT 10/27/2014  FINDINGS: Partially visible moderate to severe distension of the bladder (series 4, image 9).  Other Visualized abdominal viscera and paraspinal soft tissues are within normal limits.  Attempting to utilize the same numbering system as on the recent thoracic study, the L5 level is sacralized and there are absent or hypoplastic ribs at T12. Normal bone marrow signal in the visible thoracic and lumbar spine. Possible abnormal sclerotic marrow signal in the left S2 sacral ala (series 3, image 17, series 7, image 17), but no associated marrow edema or enhancement (series 4, image 17).  Visualized lower thoracic spinal cord is normal with conus medularis at T12-L1. No abnormal intradural enhancement.  No lower thoracic spinal stenosis.  T12-L1:  Mild epidural lipomatosis.  L1-L2: Moderate epidural lipomatosis narrowing the lateral aspect of the thecal sac (series 5, image 16). Mild facet hypertrophy with bilateral mild L1 foraminal stenosis.  L2-L3: Mild disc bulge and moderate facet and ligament flavum hypertrophy. Epidural lipomatosis narrowing the thecal sac (series 5, image 21). Mild L2 foraminal stenosis.  L3-L4: Mild to moderate circumferential disc bulge and moderate to severe facet hypertrophy greater on the right. Trace  facet joint fluid. Epidural lipomatosis narrowing the thecal sac. Mild L3 foraminal stenosis.  L4-L5: Severe chronic disc space loss and vacuum disc with endplate sclerosis and chronic endplate marrow signal changes. Superimposed epidural lipomatosis effacing CSF the thecal sac at this level. Moderate facet hypertrophy greater on the left. Mild to moderate bilateral L4 foraminal stenosis.  L5-S1: Sacralized. Epidural lipomatosis  effacing CSF throughout the L5 and visible sacral spinal canal.  IMPRESSION: 1. Severe bladder distension partially visible. Query bladder outlet obstruction. 2. Suggestion of transitional lumbosacral anatomy with sacralized L5 level. Correlation with radiographs is recommended prior to any operative intervention. 3. No metastatic disease identified in the visualized lumbosacral spine. Possible S2 sclerotic lesion on the left has a benign MRI appearance. 4. 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   Electronically Signed   By: Genevie Ann M.D.   On: 05/25/2015 15:52    Scheduled Meds: . sodium chloride   Intravenous Once  . aspirin  81 mg Oral Daily  . azithromycin  500 mg Intravenous Q24H  . cefTRIAXone (ROCEPHIN)  IV  2 g Intravenous Q24H  . digoxin  0.0625 mg Oral BID  . fluconazole  100 mg Oral Daily  . FLUoxetine  20 mg Oral Daily  . levETIRAcetam  500 mg Oral BID  . metoprolol  25 mg Oral BID  . multivitamin with minerals  1 tablet Oral Daily  . potassium chloride  20 mEq Oral BID  . sodium chloride  3 mL Intravenous Q12H  . tamsulosin  0.4 mg Oral Daily   Continuous Infusions: . 0.9 % NaCl with KCl 40 mEq / L 75 mL/hr (05/26/15 1827)    Principal Problem:   HCAP (healthcare-associated pneumonia) Active Problems:   Dyslipidemia   Coronary atherosclerosis of native coronary artery   Chronic  systolic congestive heart failure, NYHA class 3   COPD mixed type   Non-small cell carcinoma of lung, stage 3   Antineoplastic chemotherapy induced pancytopenia   Atrial fibrillation, chronic   Chest pain   Dyspnea   Neutropenia   Hypokalemia   Bacteremia   Hypomagnesemia   Healthcare-associated pneumonia    CHIU, STEPHEN K  [facility] Pager [phone]. If 7PM-7AM, please contact night-coverage at [email or web address], password [facility] 05/27/2015, 10:28 AM  LOS: 4 days

## 2015-05-28 DIAGNOSIS — D709 Neutropenia, unspecified: Secondary | ICD-10-CM

## 2015-05-28 LAB — BASIC METABOLIC PANEL
Anion gap: 8 (ref 5–15)
BUN: 7 mg/dL (ref 6–20)
CO2: 20 mmol/L — ABNORMAL LOW (ref 22–32)
Calcium: 8.5 mg/dL — ABNORMAL LOW (ref 8.9–10.3)
Chloride: 106 mmol/L (ref 101–111)
Creatinine, Ser: 0.88 mg/dL (ref 0.61–1.24)
GFR calc non Af Amer: >60 mL/min (ref >60)
Glucose, Bld: 92 mg/dL (ref 65–99)
POTASSIUM: 4.4 mmol/L (ref 3.5–5.1)
Sodium: 134 mmol/L — ABNORMAL LOW (ref 135–145)

## 2015-05-28 LAB — CBC
HCT: 22.5 % — ABNORMAL LOW (ref 39.0–52.0)
Hemoglobin: 7.8 g/dL — ABNORMAL LOW (ref 13.0–17.0)
MCH: 30.7 pg (ref 26.0–34.0)
MCHC: 34.7 g/dL (ref 30.0–36.0)
MCV: 88.6 fL (ref 78.0–100.0)
PLATELETS: 51 10*3/uL — AB (ref 150–400)
RBC: 2.54 MIL/uL — ABNORMAL LOW (ref 4.22–5.81)
RDW: 18.8 % — ABNORMAL HIGH (ref 11.5–15.5)
WBC: 7.4 10*3/uL (ref 4.0–10.5)

## 2015-05-28 LAB — CULTURE, BLOOD (ROUTINE X 2): Culture: NO GROWTH

## 2015-05-28 LAB — URINE CULTURE: Culture: NO GROWTH

## 2015-05-28 LAB — OCCULT BLOOD X 1 CARD TO LAB, STOOL: FECAL OCCULT BLD: POSITIVE — AB

## 2015-05-28 MED ORDER — CEFUROXIME AXETIL 250 MG PO TABS
250.0000 mg | ORAL_TABLET | Freq: Two times a day (BID) | ORAL | Status: DC
  Filled 2015-05-28 (×2): qty 1

## 2015-05-28 MED ORDER — AZITHROMYCIN 250 MG PO TABS
500.0000 mg | ORAL_TABLET | ORAL | Status: AC
  Administered 2015-05-28: 500 mg via ORAL
  Filled 2015-05-28: qty 2

## 2015-05-28 MED ORDER — PANTOPRAZOLE SODIUM 40 MG PO TBEC
40.0000 mg | DELAYED_RELEASE_TABLET | Freq: Every day | ORAL | Status: DC
  Administered 2015-05-28 – 2015-05-29 (×2): 40 mg via ORAL
  Filled 2015-05-28 (×2): qty 1

## 2015-05-28 MED ORDER — CEFUROXIME AXETIL 500 MG PO TABS
500.0000 mg | ORAL_TABLET | Freq: Two times a day (BID) | ORAL | Status: DC
  Administered 2015-05-28 – 2015-05-29 (×2): 500 mg via ORAL
  Filled 2015-05-28 (×4): qty 1

## 2015-05-28 NOTE — Progress Notes (Signed)
[facility] PROGRESS NOTE  KEDAR SEDANO ZOX:[id number] DOB: [birth year] DOA: 05/23/2015 PCP: [name], MD  Assessment/Plan: Principal Problem:  HCAP (healthcare-associated pneumonia) / Gm neg bacteremia - Evidence of PNA noted on CT chest on admit - Legionella neg, strep pnemo neg - HIV NR - Blood culture pos for 1/2 ecoli, multi-drug sensitive - Pt had been initially continued on empiric vancomycin/Zosyn - Have transitioned to azithromycin with rocephin based on culture results. - Pt remains afebrile with no leukocytosis   Questionable seizure / weakness  - Patient denies ever having a confirmed seizure diagnosis but is on Keppra. - Tolerating Keppra. - EEG done with findings suggestive of hx of seizures - MRI of the brain unremarkable for acute changes - lumbar and thoracic spine unremarkable, although evidence of severe bladder outlet obstruction noted on lumbar MRI (see below)   Dyslipidemia - Not currently on statin therapy - LDL of 84   Coronary atherosclerosis of native coronary artery / Chest pain / dyspnea / demand ischemia - Continue on aspirin and beta blocker. - Seen by cardiology. - CT of the chest negative for pulmonary embolism. -Troponins mildly elevated, likely secondary to demand ischemia in the setting of pneumonia. - No further ischemic workup planned by cardiology.   Chronic systolic congestive heart failure, NYHA class 3 - Appears compensated. Hold Lasix and gently hydrate. Monitor closely for signs of volume overload.  - Continue daily weights.    COPD mixed type - No evidence of acute exacerbation. Continue bronchodilation as is needed.   Non-small cell carcinoma of lung, stage 3 - Status post radiation therapy. Currently receiving chemotherapy with Taxol and carboplatin, last given 05/16/15. - Dr. Julien Nordmann was notified through [place] of the patient's admission.   Antineoplastic chemotherapy induced pancytopenia / neutropenia - Receive  Neulasta on 05/18/15. WBC improved. Patient had received Granix with ANC > 1000 and Granix since d/c'd - Platelet count stable with no signs of bleeding - Hgb trended down to 6.7 given one unit of PRBC   Atrial fibrillation, chronic - Continued digoxin and beta blocker. Continue aspirin.  - Not a candidate for anticoagulation secondary to high fall risk. - Pt recently noted to be bradycardic while asleep with short run of Vtach noted - Had discussed case with on-call Cardiologist reviewed rhythm strip and recommended continuing home dose of beta blocker ('25mg'$  bid) - Stable   Hypokalemia / hypomagnesemia - On potassium 20 mEq orally twice a day - K normalized. - Cont to follow   DVT prophylaxis - SCDs only given thrombocytopenia.  Suspected bladder outlet obstruction - Lumbar MRI incidentally demonstrates enlarged bladder worrisome for bladder outlet obstruction - Failed voiding trial with 1L urine on repeat bladder scan - Will place foley cath and follow up with Urology as outpatient  Code Status: Full Family Communication: Pt in room Disposition Plan: Pending   Consultants:  Cardiology  Procedures:    Antibiotics:  Vancomycin 05/23/15--->6/23  Zosyn 05/23/15---> 6/23  Azithromycin 6/23>>>  Rocephin 6/23>>>6/25  Ceftin 6/25>>>  HPI/Subjective: Feels better today  Objective: Filed Vitals:   05/28/15 0504 05/28/15 1022 05/28/15 1027 05/28/15 1219  BP: 131/76 118/79  118/77  Pulse: 84 108 108 76  Temp: 98.2 F (36.8 C)   97.5 F (36.4 C)  TempSrc: Oral   Oral  Resp: 22   20  Height:      Weight: 109 kg (240 lb 4.8 oz)     SpO2: 98%   98%    Intake/Output Summary (Last  24 hours) at 05/28/15 1436 Last data filed at 05/28/15 1121  Gross per 24 hour  Intake   1750 ml  Output   1285 ml  Net    465 ml   Filed Weights   05/26/15 0500 05/27/15 0542 05/28/15 0504  Weight: 107.2 kg (236 lb 5.3 oz) 108.1 kg (238 lb 5.1 oz) 109 kg (240 lb 4.8 oz)     Exam:   General:  Awake, sitting in chair, in nad  Cardiovascular: regular, s1, s2  Respiratory: normal resp effort,no wheezing  Abdomen: soft,nondistended, pos BS  Musculoskeletal: perfused, no clubbing, no cyanosis   Data Reviewed: Basic Metabolic Panel:  Recent Labs Lab 05/24/15 0630 05/25/15 0440 05/26/15 0525 05/27/15 0520 05/28/15 0501  NA 136 138 140 136 134*  K 3.1* 3.8 4.2 4.3 4.4  CL 102 108 112* 108 106  CO2 '23 24 22 '$ 20* 20*  GLUCOSE 121* 104* 92 92 92  BUN 25* '20 14 10 7  '$ CREATININE 1.25* 1.15 1.06 1.01 0.88  CALCIUM 8.2* 8.4* 8.4* 8.4* 8.5*  MG 1.4* 2.4 1.8 1.9  --    Liver Function Tests:  Recent Labs Lab 05/23/15 2000  AST 29  ALT 39  ALKPHOS 78  BILITOT 2.0*  PROT 6.4*  ALBUMIN 3.2*   No results for input(s): LIPASE, AMYLASE in the last 168 hours. No results for input(s): AMMONIA in the last 168 hours. CBC:  Recent Labs Lab 05/23/15 1313 05/24/15 0630 05/25/15 0440 05/26/15 0525 05/27/15 0520 05/27/15 1817 05/28/15 0501  WBC 0.7* 1.0* 3.4* 8.4 7.8  --  7.4  NEUTROABS 0.2*  --  2.4  --   --   --   --   HGB 8.8* 7.8* 7.3* 7.1* 6.7* 7.5* 7.8*  HCT 25.6* 22.7* 22.0* 21.0* 19.8* 22.0* 22.5*  MCV 90.5 91.5 91.7 93.3 92.5  --  88.6  PLT 88* 69* 69* 93* 58*  --  51*   Cardiac Enzymes:  Recent Labs Lab 05/23/15 2000 05/24/15 0022 05/24/15 0630  TROPONINI 0.07* 0.08* 0.06*   BNP (last 3 results)  Recent Labs  04/28/15 1300 05/23/15 1313  BNP 293.9* 141.1*    ProBNP (last 3 results)  Recent Labs  03/29/15 1133 04/05/15 1134  PROBNP 543.0* 252.0*    CBG: No results for input(s): GLUCAP in the last 168 hours.  Recent Results (from the past 240 hour(s))  Urine culture     Status: None   Collection Time: 05/23/15 12:47 PM  Result Value Ref Range Status   Specimen Description URINE, CLEAN CATCH  Final   Special Requests NONE  Final   Culture   Final    20,000 COLONIES/mL VANCOMYCIN RESISTANT ENTEROCOCCUS  ISOLATED Performed at [facility]    Report Status 05/27/2015 FINAL  Final   Organism ID, Bacteria VANCOMYCIN RESISTANT ENTEROCOCCUS ISOLATED  Final      Susceptibility   Vancomycin resistant enterococcus isolated - MIC*    AMPICILLIN <=2 SENSITIVE Sensitive     LEVOFLOXACIN >=8 RESISTANT Resistant     NITROFURANTOIN <=16 SENSITIVE Sensitive     VANCOMYCIN >=32 RESISTANT Resistant     LINEZOLID 2 SENSITIVE Sensitive     * 20,000 COLONIES/mL VANCOMYCIN RESISTANT ENTEROCOCCUS ISOLATED  Blood culture (routine x 2)     Status: None   Collection Time: 05/23/15  4:44 PM  Result Value Ref Range Status   Specimen Description BLOOD RIGHT PORTA CATH  Final   Special Requests BOTTLES DRAWN AEROBIC AND ANAEROBIC 5ML  Final   Culture   Final    NO GROWTH 5 DAYS Performed at [facility]    Report Status 05/28/2015 FINAL  Final  Blood culture (routine x 2)     Status: None (Preliminary result)   Collection Time: 05/23/15  5:07 PM  Result Value Ref Range Status   Specimen Description BLOOD LEFT ANTECUBITAL  Final   Special Requests BOTTLES DRAWN AEROBIC AND ANAEROBIC 5ML  Final   Culture   Final    ESCHERICHIA COLI CRITICAL RESULT CALLED TO, READ BACK BY AND VERIFIED WITH: K BEESON 05/25/15 @ 1400 M VESTAL Performed at [facility]    Report Status PENDING  Incomplete   Organism ID, Bacteria ESCHERICHIA COLI  Final      Susceptibility   Escherichia coli - MIC*    AMPICILLIN >=32 RESISTANT Resistant     CEFAZOLIN <=4 SENSITIVE Sensitive     CEFEPIME <=1 SENSITIVE Sensitive     CEFTAZIDIME <=1 SENSITIVE Sensitive     CEFTRIAXONE <=1 SENSITIVE Sensitive     CIPROFLOXACIN <=0.25 SENSITIVE Sensitive     GENTAMICIN <=1 SENSITIVE Sensitive     IMIPENEM <=0.25 SENSITIVE Sensitive     TRIMETH/SULFA >=320 RESISTANT Resistant     AMPICILLIN/SULBACTAM 8 SENSITIVE Sensitive     PIP/TAZO <=4 SENSITIVE Sensitive     * ESCHERICHIA COLI  Clostridium Difficile by PCR (not at  [facility])     Status: None   Collection Time: 05/24/15  6:24 AM  Result Value Ref Range Status   C difficile by pcr NEGATIVE NEGATIVE Final  Culture, Urine     Status: None   Collection Time: 05/27/15 12:22 PM  Result Value Ref Range Status   Specimen Description URINE, CATHETERIZED  Final   Special Requests NONE  Final   Culture   Final    NO GROWTH 1 DAY Performed at [facility]    Report Status 05/28/2015 FINAL  Final     Studies: No results found.  Scheduled Meds: . aspirin  81 mg Oral Daily  . azithromycin  500 mg Oral Every 24 Hours  . cefUROXime  500 mg Oral BID WC  . digoxin  0.0625 mg Oral BID  . fluconazole  100 mg Oral Daily  . FLUoxetine  20 mg Oral Daily  . levETIRAcetam  500 mg Oral BID  . metoprolol  25 mg Oral BID  . multivitamin with minerals  1 tablet Oral Daily  . potassium chloride  20 mEq Oral BID  . sodium chloride  3 mL Intravenous Q12H  . tamsulosin  0.4 mg Oral Daily   Continuous Infusions:    Principal Problem:   HCAP (healthcare-associated pneumonia) Active Problems:   Dyslipidemia   Coronary atherosclerosis of native coronary artery   Chronic systolic congestive heart failure, NYHA class 3   COPD mixed type   Non-small cell carcinoma of lung, stage 3   Antineoplastic chemotherapy induced pancytopenia   Atrial fibrillation, chronic   Chest pain   Dyspnea   Neutropenia   Hypokalemia   Bacteremia   Hypomagnesemia   Healthcare-associated pneumonia    CHIU, STEPHEN K  [facility] Pager [phone]. If 7PM-7AM, please contact night-coverage at [email or web address], password [facility] 05/28/2015, 2:36 PM  LOS: 5 days

## 2015-05-28 NOTE — Progress Notes (Signed)
OT Cancellation Note  Patient Details Name: Alejandro [name] MRN: [id number] DOB: 04/26/1946   Cancelled Treatment:    Reason Eval/Treat Not Completed: Fatigue/lethargy limiting ability to participate  Betsy Pries 05/28/2015, 3:08 PM

## 2015-05-28 NOTE — Progress Notes (Signed)
ANTIBIOTIC CONSULT NOTE - Follow up  Pharmacy Consult for Antibiotic renal dose adjustment  Indication: pneumonia, bacteremia  No Known Allergies  Patient Measurements: Height: '5\' 8"'$  (172.7 cm) Weight: 240 lb 4.8 oz (109 kg) IBW/kg (Calculated) : 68.4  Vital Signs: Temp: 98.2 F (36.8 C) (06/25 0504) Temp Source: Oral (06/25 0504) BP: 118/79 mmHg (06/25 1022) Pulse Rate: 108 (06/25 1027) Intake/Output from previous day: 06/24 0701 - 06/25 0700 In: 1780 [P.O.:480; I.V.:240; Blood:360; IV Piggyback:300] Out: 750 [Urine:750]  Labs:  Recent Labs  05/26/15 0525 05/27/15 0520 05/27/15 1817 05/28/15 0501  WBC 8.4 7.8  --  7.4  HGB 7.1* 6.7* 7.5* 7.8*  PLT 93* 58*  --  51*  CREATININE 1.06 1.01  --  0.88   Estimated Creatinine Clearance: 94.8 mL/min (by C-G formula based on Cr of 0.88).   Assessment: 86 YOM presents on 6/20 with SOB. Labs reveal neutropenia at time of admission (neulasta given 6/15), he is undergoing chemo for lung cancer. Per CTA 6/20 new patchy consolidation in the right upper lobe, right middle lobe and superior right lower with differential including multifocal pneumonia, aspiration pneumonitis, or delayed postradiation change. Pharmacy was initially consulted to dose Vancomycin and Zosyn.  Antibiotics have now been narrowed to Azithromycin and Ceftin.  6/20 >> vancomycin  >> 6/23 6/20 >> zosyn >> 6/23 6/20 >> diflucan >> 6/23 >> ceftriaxone >> 6/25 6/23 >> Azithromycin >> 6/25 >> Ceftin >>  Micro: 6/20 blood x 2: 1/2 NGTD, 1/2 with E.coli 6/20 urine: 20,000 colonies/mL VRE species 6/21 C.diff PCR: negative 6/24 Urine: NGF  Today, 05/28/2015:  Afebrile  WBC WNL  SCr improved, CrCl ~ 95 ml/min  Goal of Therapy:  Vancomycin trough level 15-20 mcg/ml  Plan:   Increase to Ceftin '500mg'$  PO BID.  Continue Azithromycin '500mg'$  PO daily  Follow peripherally for renal function, clinical condition.  Gretta Arab PharmD, BCPS Pager  [phone] 05/28/2015 12:39 PM

## 2015-05-28 NOTE — Evaluation (Addendum)
Physical Therapy Evaluation Patient Details Name: Alejandro [name] MRN: [id number] DOB: 1946-11-11 Today's Date: 05/28/2015   History of Present Illness  69 yo male admitted with Pna, weakness, questionable seizure, chest pain. hx of CABG, CHF, NSCLC, A fib, Sz, CVA, MI, HTN, ETOH abuse.   Clinical Impression  On eval, pt required Min assist for mobility-only able to tolerate short distance of ambulation ~25 feet with standard walker. Offered pt RW to use but pt stated he preferred to use Standard walker that was already in his room??? Assist to stabilize-noted LE weakness/instability. Discussed d/c plan-pt stated he would be discharging home with wife assisting as needed.     Follow Up Recommendations Home health PT;Supervision/Assistance - 24 hour    Equipment Recommendations  None recommended by PT    Recommendations for Other Services OT consult     Precautions / Restrictions Precautions Precautions: Fall Restrictions Weight Bearing Restrictions: No      Mobility  Bed Mobility               General bed mobility comments: pt oob in recliner  Transfers Overall transfer level: Needs assistance Equipment used: Standard walker Transfers: Sit to/from Stand Sit to Stand: Min guard         General transfer comment: close guard for safety  Ambulation/Gait Ambulation/Gait assistance: Min guard Ambulation Distance (Feet): 25 Feet Assistive device: Standard walker Gait Pattern/deviations: Step-to pattern;Decreased stride length     General Gait Details: Assist to stabilize. VCS for safety, safe use of RW. Offerred pt RW to use however he preferred to use SW (even though he uses rollator at home?). Fatigues quickly/easily. LE instability/weakness noted.  Stairs            Wheelchair Mobility    Modified Rankin (Stroke Patients Only)       Balance           Standing balance support: Bilateral upper extremity supported;During functional activity Standing  balance-Leahy Scale: Poor Standing balance comment: needs RW                             Pertinent Vitals/Pain Pain Assessment: No/denies pain    Home Living Family/patient expects to be discharged to:: Private residence Living Arrangements: Spouse/significant other Available Help at Discharge: Family;Available 24 hours/day Type of Home: House Home Access: Ramped entrance     Home Layout: One level Home Equipment: Walker - 4 wheels;Walker - 2 wheels;Cane - single point;Cane - quad      Prior Function Level of Independence: Independent with assistive device(s)         Comments: uses rollator     Hand Dominance        Extremity/Trunk Assessment   Upper Extremity Assessment: Generalized weakness           Lower Extremity Assessment: Generalized weakness      Cervical / Trunk Assessment: Normal  Communication      Cognition Arousal/Alertness: Awake/alert Behavior During Therapy: WFL for tasks assessed/performed Overall Cognitive Status: Within Functional Limits for tasks assessed                      General Comments      Exercises        Assessment/Plan    PT Assessment Patient needs continued PT services  PT Diagnosis Difficulty walking;Generalized weakness   PT Problem List Decreased strength;Decreased activity tolerance;Decreased balance;Decreased mobility;Obesity  PT Treatment Interventions DME instruction;Gait  training;Functional mobility training;Therapeutic activities;Therapeutic exercise;Balance training;Patient/family education   PT Goals (Current goals can be found in the Care Plan section) Acute Rehab PT Goals Patient Stated Goal: none stated PT Goal Formulation: With patient Time For Goal Achievement: 06/11/15 Potential to Achieve Goals: Good    Frequency Min 3X/week   Barriers to discharge        Co-evaluation               End of Session Equipment Utilized During Treatment: Gait belt Activity  Tolerance: Patient limited by fatigue Patient left: in chair;with call bell/phone within reach           Time: 1139-1149 PT Time Calculation (min) (ACUTE ONLY): 10 min   Charges:   PT Evaluation $Initial PT Evaluation Tier I: 1 Procedure     PT G Codes:        Weston Anna, MPT Pager: [phone]

## 2015-05-28 NOTE — Progress Notes (Signed)
Bladder scan performed per MD order. Bladder scan showed >916m. Patient voided 425 ml immediately after bladder scan and PVR showed 410 ml remaining in bladder. Dr. CWyline Copason unit  was verbally notified, order to place foley catheter complete.

## 2015-05-28 NOTE — Plan of Care (Signed)
Problem: Phase III Progression Outcomes Goal: Voiding independently Outcome: Not Met (add Reason) Urinary retention foley catheter placed per MD order

## 2015-05-29 ENCOUNTER — Inpatient Hospital Stay (HOSPITAL_COMMUNITY): Payer: Medicare Other

## 2015-05-29 LAB — CULTURE, BLOOD (ROUTINE X 2)

## 2015-05-29 LAB — CBC
HEMATOCRIT: 23.8 % — AB (ref 39.0–52.0)
Hemoglobin: 8.2 g/dL — ABNORMAL LOW (ref 13.0–17.0)
MCH: 30.6 pg (ref 26.0–34.0)
MCHC: 34.5 g/dL (ref 30.0–36.0)
MCV: 88.8 fL (ref 78.0–100.0)
Platelets: 59 10*3/uL — ABNORMAL LOW (ref 150–400)
RBC: 2.68 MIL/uL — AB (ref 4.22–5.81)
RDW: 18.6 % — ABNORMAL HIGH (ref 11.5–15.5)
WBC: 6.9 10*3/uL (ref 4.0–10.5)

## 2015-05-29 LAB — TYPE AND SCREEN
ABO/RH(D): O NEG
Antibody Screen: NEGATIVE
Unit division: 0

## 2015-05-29 MED ORDER — IOHEXOL 300 MG/ML  SOLN
100.0000 mL | Freq: Once | INTRAMUSCULAR | Status: AC | PRN
  Administered 2015-05-29: 100 mL via INTRAVENOUS

## 2015-05-29 MED ORDER — IOHEXOL 300 MG/ML  SOLN
50.0000 mL | Freq: Once | INTRAMUSCULAR | Status: AC | PRN
  Administered 2015-05-29: 50 mL via ORAL

## 2015-05-29 MED ORDER — PANTOPRAZOLE SODIUM 40 MG PO TBEC: 40.0000 mg | DELAYED_RELEASE_TABLET | Freq: Every day | ORAL | Status: DC

## 2015-05-29 MED ORDER — CEFUROXIME AXETIL 500 MG PO TABS: 500.0000 mg | ORAL_TABLET | Freq: Two times a day (BID) | ORAL | Status: DC

## 2015-05-29 MED ORDER — HEPARIN SOD (PORK) LOCK FLUSH 100 UNIT/ML IV SOLN
500.0000 [IU] | INTRAVENOUS | Status: DC
  Administered 2015-05-29: 500 [IU]
  Filled 2015-05-29: qty 5

## 2015-05-29 MED ORDER — TAMSULOSIN HCL 0.4 MG PO CAPS: 0.4000 mg | ORAL_CAPSULE | Freq: Every day | ORAL | Status: DC

## 2015-05-29 MED ORDER — SACCHAROMYCES BOULARDII 250 MG PO CAPS: 250.0000 mg | ORAL_CAPSULE | Freq: Two times a day (BID) | ORAL | Status: DC

## 2015-05-29 MED ORDER — OXYCODONE HCL 5 MG PO TABS: 5.0000 mg | ORAL_TABLET | ORAL | Status: DC | PRN

## 2015-05-29 MED ORDER — HEPARIN SOD (PORK) LOCK FLUSH 100 UNIT/ML IV SOLN
500.0000 [IU] | INTRAVENOUS | Status: DC | PRN
  Filled 2015-05-29: qty 5

## 2015-05-29 NOTE — Evaluation (Signed)
Occupational Therapy Evaluation Patient Details Name: Alejandro [name] MRN: [id number] DOB: [birth year] Today's Date: 05/29/2015    History of Present Illness 69 yo male admitted with Pna, weakness, questionable seizure, chest pain. hx of CABG, CHF, NSCLC, A fib, Sz, CVA, MI, HTN, ETOH abuse.    Clinical Impression   Pt admitted with PNA. Pt currently with functional limitations due to the deficits listed below (see OT Problem List).  Pt will benefit from skilled OT to increase their safety and independence with ADL and functional mobility for ADL to facilitate discharge to venue listed below.     Follow Up Recommendations  No OT follow up;Supervision/Assistance - 24 hour    Equipment Recommendations  None recommended by OT    Recommendations for Other Services       Precautions / Restrictions Restrictions Weight Bearing Restrictions: No      Mobility Bed Mobility   Bed Mobility: Supine to Sit     Supine to sit: Min assist        Transfers Overall transfer level: Needs assistance Equipment used: 1 person hand held assist Transfers: Sit to/from Stand Sit to Stand: Min assist Stand pivot transfers: Min assist                 ADL Overall ADL's : Needs assistance/impaired     Grooming: Set up;Sitting   Upper Body Bathing: Minimal assitance;Sitting   Lower Body Bathing: Moderate assistance;Sit to/from stand   Upper Body Dressing : Min guard;Sitting   Lower Body Dressing: Moderate assistance;Sit to/from stand                       Vision Vision Assessment?: No apparent visual deficits          Pertinent Vitals/Pain Pain Assessment: No/denies pain        Extremity/Trunk Assessment         Cervical / Trunk Assessment Cervical / Trunk Assessment: Normal   Communication Communication Communication: No difficulties   Cognition Arousal/Alertness: Awake/alert Behavior During Therapy: WFL for tasks assessed/performed Overall Cognitive  Status: Within Functional Limits for tasks assessed                                Home Living Family/patient expects to be discharged to:: Private residence Living Arrangements: Spouse/significant other Available Help at Discharge: Family;Available 24 hours/day Type of Home: House Home Access: Ramped entrance     Home Layout: One level               Home Equipment: Walker - 4 wheels;Walker - 2 wheels;Cane - single point;Cane - quad          Prior Functioning/Environment Level of Independence: Independent with assistive device(s)        Comments: uses rollator    OT Diagnosis: Generalized weakness   OT Problem List: Decreased strength;Decreased activity tolerance   OT Treatment/Interventions: Self-care/ADL training;Patient/family education    OT Goals(Current goals can be found in the care plan section) Acute Rehab OT Goals Patient Stated Goal: go home OT Goal Formulation: With patient  OT Frequency: Min 2X/week   Barriers to D/C:               End of Session Equipment Utilized During Treatment: Gait belt Nurse Communication: Mobility status  Activity Tolerance: Patient tolerated treatment well Patient left: in chair   Time: 4098-1191 OT Time Calculation (min): 14 min Charges:  OT General Charges $OT Visit: 1 Procedure OT Evaluation $Initial OT Evaluation Tier I: 1 Procedure G-Codes:    Alejandro [name] 06-Jun-2015, 12:27 PM

## 2015-05-29 NOTE — Progress Notes (Signed)
Patient being discharged to home with foley. Discharge instructions foley catheter care and VRE education completed with wife  using teach back method.

## 2015-05-29 NOTE — Plan of Care (Signed)
Problem: Phase III Progression Outcomes Goal: Foley discontinued Outcome: Not Applicable Date Met:  12/25/77 Patient being discharged to home with foley

## 2015-05-29 NOTE — Discharge Summary (Signed)
Physician Discharge Summary  Alejandro [name] MWN:[id number] DOB: 1946/04/06 DOA: 05/23/2015  PCP: [name], MD  Admit date: 05/23/2015 Discharge date: 05/29/2015  Time spent: 20 minutes  Recommendations for Outpatient Follow-up:  1. Follow up with PCP in 1-2 weeks 2. Follow up with Urology as scheduled 3. Follow up with Oncology as scheduled  Discharge Diagnoses:  Principal Problem:   HCAP (healthcare-associated pneumonia) Active Problems:   Dyslipidemia   Coronary atherosclerosis of native coronary artery   Chronic systolic congestive heart failure, NYHA class 3   COPD mixed type   Non-small cell carcinoma of lung, stage 3   Antineoplastic chemotherapy induced pancytopenia   Atrial fibrillation, chronic   Chest pain   Dyspnea   Neutropenia   Hypokalemia   Bacteremia   Hypomagnesemia   Healthcare-associated pneumonia   Discharge Condition: Improved  Diet recommendation: Heart healthy  Filed Weights   05/27/15 0542 05/28/15 0504 05/29/15 0716  Weight: 108.1 kg (238 lb 5.1 oz) 109 kg (240 lb 4.8 oz) 104.1 kg (229 lb 8 oz)    History of present illness:  Please see admit h and p from 6/20 for details. Briefly, pt presented with generalized weakness and chest pains, ultimately admitted given concerns of HCAP  Hospital Course:   HCAP (healthcare-associated pneumonia) / Gm neg bacteremia - Evidence of PNA noted on CT chest on admit - Legionella neg, strep pnemo neg - HIV NR - Blood culture was later found to be pos for 1/2 ecoli, multi-drug sensitive - Pt had been initially continued on empiric vancomycin/Zosyn - Later transitioned to azithromycin with rocephin based on culture results. - Remained afebrile with no leukocytosis   Questionable seizure / weakness  - Patient denied ever having a confirmed seizure diagnosis but remained on Keppra. - Tolerated Keppra. - EEG was done with findings suggestive of hx of seizures - MRI of the brain was unremarkable for acute  changes - lumbar and thoracic spine unremarkable, although evidence of severe bladder outlet obstruction was noted on lumbar MRI (see below)   Dyslipidemia - Not currently on statin therapy - LDL of 84   Coronary atherosclerosis of native coronary artery / Chest pain / dyspnea / demand ischemia - Continue on aspirin and beta blocker. - Was seen by cardiology. - CT of the chest negative for pulmonary embolism. -Troponins mildly elevated, likely secondary to demand ischemia in the setting of pneumonia. - No further ischemic workup planned by cardiology.   Chronic systolic congestive heart failure, NYHA class 3 - Appears compensated. Hold Lasix and gently hydrate. Monitor closely for signs of volume overload.  - Continue daily weights.    COPD mixed type - No evidence of acute exacerbation. Continued bronchodilation as is needed.   Non-small cell carcinoma of lung, stage 3 - Status post radiation therapy. Currently receiving chemotherapy with Taxol and carboplatin, last given 05/16/15. - Dr. Julien Nordmann was notified through [facility] of the patient's admission.   Antineoplastic chemotherapy induced pancytopenia / neutropenia - Receive Neulasta on 05/18/15. WBC improved. Patient had received Granix with ANC > 1000 and Granix since d/c'd - Platelet count stable with no signs of bleeding - Hgb trended down to 6.7 given one unit of PRBC - Stools were later to be occult pos for blood. Case discussed with GI who recommended PPI and repeat hgb was found to be up to 8.2 post-transfusion   Atrial fibrillation, chronic - Continued digoxin and beta blocker. Continue aspirin.  - Not a candidate for anticoagulation secondary to high  fall risk. - Pt recently noted to be bradycardic while asleep with short run of Vtach noted - Had discussed case with on-call Cardiologist reviewed rhythm strip and recommended continuing home dose of beta blocker ('25mg'$  bid) - Stable   Hypokalemia /  hypomagnesemia - On potassium 20 mEq orally twice a day - K normalized this admission.   DVT prophylaxis - SCDs only given thrombocytopenia.  Suspected bladder outlet obstruction - Lumbar MRI incidentally demonstrates enlarged bladder worrisome for bladder outlet obstruction - Failed voiding trial with 1L urine on repeat bladder scan - Will continue foley cath - Case discussed with Urology who recommends d/c with foley cath and close follow up. Of note, pt was previously referred to Urology for urethral stricture, but reportedly never showed. Urology to contact pt of upcoming appointment after discharge. - Bladder appeared normal on CT abd  Consultations:  Cardiology  Urology over phone  Discharge Exam: Filed Vitals:   05/28/15 1825 05/28/15 2248 05/29/15 0716 05/29/15 1001  BP: 149/60 122/71 142/83 125/71  Pulse: 96 89 82 101  Temp: 97.7 F (36.5 C) 98 F (36.7 C) 97.6 F (36.4 C)   TempSrc: Oral Oral Oral   Resp: '16 24 20   '$ Height:      Weight:   104.1 kg (229 lb 8 oz)   SpO2: 98% 97% 98%     General: awake, in nad Cardiovascular: regular, s1, s2 Respiratory: normal resp effort, no wheezing  Discharge Instructions     Medication List    STOP taking these medications        fluconazole 100 MG tablet  Commonly known as:  DIFLUCAN      TAKE these medications        albuterol 108 (90 BASE) MCG/ACT inhaler  Commonly known as:  PROVENTIL HFA;VENTOLIN HFA  Inhale 2 puffs into the lungs every 4 (four) hours as needed for wheezing or shortness of breath.     aspirin 81 MG tablet  Take 81 mg by mouth daily.     cefUROXime 500 MG tablet  Commonly known as:  CEFTIN  Take 1 tablet (500 mg total) by mouth 2 (two) times daily with a meal.     digoxin 0.125 MG tablet  Commonly known as:  LANOXIN  Take 1 tablet (0.125 mg total) by mouth daily.     FLUoxetine 20 MG capsule  Commonly known as:  PROZAC  Take 1 capsule (20 mg total) by mouth daily.      furosemide 40 MG tablet  Commonly known as:  LASIX  Take 1 tablet (40 mg total) by mouth daily.     levETIRAcetam 500 MG tablet  Commonly known as:  KEPPRA  Take 1 tablet (500 mg total) by mouth 2 (two) times daily.     lidocaine-prilocaine cream  Commonly known as:  EMLA  Apply 1 application topically as needed.     metoprolol 50 MG tablet  Commonly known as:  LOPRESSOR  Take 25 mg by mouth 2 (two) times daily.     metoprolol tartrate 25 MG tablet  Commonly known as:  LOPRESSOR  Take 1 tablet (25 mg total) by mouth 2 (two) times daily.     multivitamin with minerals Tabs tablet  Take 1 tablet by mouth daily.     oxyCODONE 5 MG immediate release tablet  Commonly known as:  Oxy IR/ROXICODONE  Take 1 tablet (5 mg total) by mouth every 4 (four) hours as needed for moderate pain.  pantoprazole 40 MG tablet  Commonly known as:  PROTONIX  Take 1 tablet (40 mg total) by mouth daily.     saccharomyces boulardii 250 MG capsule  Commonly known as:  FLORASTOR  Take 1 capsule (250 mg total) by mouth 2 (two) times daily.     tamsulosin 0.4 MG Caps capsule  Commonly known as:  FLOMAX  Take 1 capsule (0.4 mg total) by mouth daily.     UNABLE TO FIND  Med Name:Wheelchair Per medical necessity patient requires wheelchair to all perform daily activities. Pt is able to self propel chair. Unable to use can or walker due to balance       No Known Allergies Follow-up Information    Follow up with [name], MD. Schedule an appointment as soon as possible for a visit in 1 week.   Specialty:  Internal Medicine   Why:  Hospital follow up   Contact information:   [place] [id number] [phone]       Schedule an appointment as soon as possible for a visit with [name], MD.   Specialty:  Urology   Why:  Hospital follow up - you will also be contacted to schedule an appointment   Contact information:   [place]  [phone]        The results of significant diagnostics from this hospitalization (including imaging, microbiology, ancillary and laboratory) are listed below for reference.    Significant Diagnostic Studies: Dg Chest 2 View  05/23/2015   CLINICAL DATA:  Short of breath. Diarrhea. RIGHT-sided lung cancer. Left-sided chest pain. Weakness.  EXAM: CHEST  2 VIEW  COMPARISON:  Multiple priors dating back to 09/07/2014.  FINDINGS: Cardiopericardial silhouette enlarged. Median sternotomy. Monitoring leads project over the chest. RIGHT IJ power port with the tip in the lower superior vena cava.  There is mild apical lordotic projection on today's chest radiograph. When compared to 05/11/2014, the opacity in the RIGHT mid lung continues to be more prominent than on prior chest radiographs, suggesting progression of tumor in the RIGHT lung. This could also represent radiation changes if patient is undergoing radiation therapy.  IMPRESSION: Short term stability of the RIGHT chest nodule with surrounding airspace opacity. No definite acute cardiopulmonary disease on today's chest radiograph. Chronic cardiomegaly without failure.   Electronically Signed   By: Dereck Ligas M.D.   On: 05/23/2015 13:44   Dg Chest 2 View  05/12/2015   CLINICAL DATA:  Several weeks of cough, follow-up pneumonia, known right upper lobe non-small cell lung malignancy.  EXAM: CHEST  2 VIEW  COMPARISON:  Chest x-ray of Apr 29, 2015 and CT scan of the chest of March 11, 2015.  FINDINGS: The lungs are mildly hyperinflated. Patchy alveolar opacity in the right mid lung persists. The surrounding increased interstitial densities have improved however. There is increased density in the medial right infrahilar region consistent with partial right lower lobe atelectasis. There is a small right pleural effusion. The left lung is clear. The cardiac silhouette is top-normal in size. The pulmonary vascularity is not engorged. The Port-A-Cath  appliance tip projects over the distal third of the SVC. There are 7 intact sternal wires.  IMPRESSION: 1. Mild interval improvement in the appearance of the right lung. The alveolar opacities have improved and there has been considerable improvement in the surrounding interstitial infiltrates. 2. New subsegmental right lower lobe atelectasis. Persistent small right pleural effusion.  Electronically Signed   By: David  Martinique M.D.   On: 05/12/2015 14:31   Ct Head Wo Contrast  05/23/2015   CLINICAL DATA:  Shortness of breath and chest pain. Increasing weakness. History of brain surgery.  EXAM: CT HEAD WITHOUT CONTRAST  TECHNIQUE: Contiguous axial images were obtained from the base of the skull through the vertex without intravenous contrast.  COMPARISON:  06/01/2014 head CT  FINDINGS: Skull and Sinuses:A right craniotomy bone flap has been replaced. The operative site is unremarkable.  No sinus or mastoid effusion.  Orbits: No acute abnormality.  Brain: No evidence of acute infarction, hemorrhage, hydrocephalus, or mass lesion/mass effect.  Expected evolution of low-density in the right temporal lobe, now with circumscribed margins and negative mass effect, consistent with gliosis. A parenchymal hematoma was noted in this region on 2015 imaging.  Stable pattern of extensive chronic small vessel disease with patchy ischemic gliosis throughout the bilateral cerebral white matter. There have been numerous remote bilateral cerebellar infarcts.  IMPRESSION: 1. No acute findings. 2. 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 3. Posttraumatic right temporal lobe gliosis.   Electronically Signed   By: Monte Fantasia M.D.   On: 05/23/2015 14:00   Ct Angio Chest Pe W/cm &/or Wo Cm  05/23/2015   CLINICAL DATA:  Left-sided chest pain and short of breath. Elevated D-dimer. Non-small cell lung cancer diagnosis January 2016. Chemotherapy in progress. Radiation therapy complete.   EXAM: CT ANGIOGRAPHY CHEST WITH CONTRAST  TECHNIQUE: Multidetector CT imaging of the chest was performed using the standard protocol during bolus administration of intravenous contrast. Multiplanar CT image reconstructions and MIPs were obtained to evaluate the vascular anatomy.  CONTRAST:  52m OMNIPAQUE IOHEXOL 350 MG/ML SOLN  COMPARISON:  Chest CT 03/11/2015  FINDINGS: Mediastinum/Nodes: No filling defects within the pulmonary arteries to suggest acute pulmonary embolism. No acute findings of the aorta great vessels.  No mediastinal lymphadenopathy. Right lower paratracheal lymph node measures 10 mm short axis decreased from 19 on prior. No pericardial fluid. Post CABG anatomy  Lungs/Pleura: There is patchy foci of could type consolidation with air bronchograms in the right upper lobe, right middle lobe and superior segment of the right lower lobe. There is central para bronchovascular thickening extending from the right hilum. Left lung is clear.  Upper abdomen: Limited view of the liver, kidneys, pancreas are unremarkable. Normal adrenal glands.  Musculoskeletal: No acute osseous abnormality.  Review of the MIP images confirms the above findings.  IMPRESSION: 1. New patchy consolidation in the right upper lobe, right middle lobe and superior right lower with differential including multifocal pneumonia, aspiration pneumonitis, or delayed postradiation change. Lung cancer recurrence is not favored. Unilateral drug reaction would also be unlikely. 2. No evidence of acute pulmonary embolism.   Electronically Signed   By: SSuzy BouchardM.D.   On: 05/23/2015 15:55   Mr Brain Wo Contrast  05/24/2015   CLINICAL DATA:  69year old male with history of non small cell lung cancer presenting with 2 day history of generalized weakness. Possible seizure. History of subdural hematoma and temporal lobe abscess. Subsequent encounter.  EXAM: MRI HEAD WITHOUT CONTRAST  TECHNIQUE: Multiplanar, multiecho pulse sequences of  the brain and surrounding structures were obtained without intravenous contrast.  COMPARISON:  05/23/2015 head CT.  08/18/2014 brain MR.  FINDINGS: Exam is motion degraded. Patient was not able to complete full sequences.  No acute infarct.  Remote cerebellar infarcts.  Prior right temporal craniotomy. Encephalomalacia right temporal  lobe and dilated right temporal horn relatively similar to prior exam. Within the right temporal lobe, 1.4 x 0.8 x 0.5 cm fluid collection. This was at the level where patient had a prior abscess. This appears slightly larger than on the 08/18/2014 examination when this measured 1.4 x 0.7 x 0.4 cm. No restricted motion at this level as may be seen with an abscess.  Significant white matter type changes suggestive of result of small vessel disease and/ or treatment of tumor. No obvious findings of intracranial metastatic disease although with the degree of motion lack of contrast, evaluation is limited.  Global atrophy without hydrocephalus.  Major intracranial vascular structures are patent.  Cervical medullary junction, pituitary region, pineal region and orbital structures unremarkable.  IMPRESSION: Exam is motion degraded. Patient was not able to complete full sequences.  No acute infarct.  Remote cerebellar infarcts. Prominent small vessel disease type changes.  Prior right temporal craniotomy for treatment of abscess. No definitive findings of intracranial abscess noted at the current time. Small fluid collection superior right temporal is minimally larger than on prior exam. If there are progressive mental status changes, close follow-up imaging with attention to the region recommended.  No obvious findings of intracranial metastatic disease although with lack of contrast, evaluation is limited.  Global atrophy without hydrocephalus.   Electronically Signed   By: Genia Del M.D.   On: 05/24/2015 16:16   Mr Thoracic Spine Wo Contrast  05/24/2015   CLINICAL DATA:  New bowel  and bladder incontinence. Weakness. Stage III non-small cell carcinoma of the lung.  EXAM: MRI THORACIC SPINE WITHOUT CONTRAST  TECHNIQUE: Multiplanar, multisequence MR imaging of the thoracic spine was performed. No intravenous contrast was administered.  COMPARISON:  CT scan of the chest dated 05/23/2015  FINDINGS: The thoracic spinal cord appears normal including the tip of the conus at T12-L1. There is no spinal or foraminal stenosis. No facet arthritis. Chronic accentuation of the thoracic kyphosis with narrowing of the disc spaces from T5 sixth through at T9-10 with no disc protrusions or disc bulges. No mass lesions. No metastatic disease. Paraspinal soft tissues are normal.  IMPRESSION: No significant abnormality of the thoracic spine. Chronic accentuation of the thoracic kyphosis with degenerative changes of the discs in the mid thoracic spine.   Electronically Signed   By: Lorriane Shire M.D.   On: 05/24/2015 16:04   Mr Lumbar Spine W Wo Contrast  05/25/2015   CLINICAL DATA:  69 year old male with non-small cell lung cancer. Two day history of generalized weakness, possible seizure. Lumbar back pain. Subsequent encounter.  EXAM: MRI LUMBAR SPINE WITHOUT AND WITH CONTRAST  TECHNIQUE: Multiplanar and multiecho pulse sequences of the lumbar spine were obtained without and with intravenous contrast.  CONTRAST:  35m MULTIHANCE GADOBENATE DIMEGLUMINE 529 MG/ML IV SOLN  COMPARISON:  Thoracic spine MRI 05/24/2015. Chest CTA 05/23/2015. CT Abdomen and Pelvis 01/28/2013. PET-CT 10/27/2014  FINDINGS: Partially visible moderate to severe distension of the bladder (series 4, image 9).  Other Visualized abdominal viscera and paraspinal soft tissues are within normal limits.  Attempting to utilize the same numbering system as on the recent thoracic study, the L5 level is sacralized and there are absent or hypoplastic ribs at T12. Normal bone marrow signal in the visible thoracic and lumbar spine. Possible abnormal  sclerotic marrow signal in the left S2 sacral ala (series 3, image 17, series 7, image 17), but no associated marrow edema or enhancement (series 4, image 17).  Visualized lower thoracic  spinal cord is normal with conus medularis at T12-L1. No abnormal intradural enhancement.  No lower thoracic spinal stenosis.  T12-L1:  Mild epidural lipomatosis.  L1-L2: Moderate epidural lipomatosis narrowing the lateral aspect of the thecal sac (series 5, image 16). Mild facet hypertrophy with bilateral mild L1 foraminal stenosis.  L2-L3: Mild disc bulge and moderate facet and ligament flavum hypertrophy. Epidural lipomatosis narrowing the thecal sac (series 5, image 21). Mild L2 foraminal stenosis.  L3-L4: Mild to moderate circumferential disc bulge and moderate to severe facet hypertrophy greater on the right. Trace facet joint fluid. Epidural lipomatosis narrowing the thecal sac. Mild L3 foraminal stenosis.  L4-L5: Severe chronic disc space loss and vacuum disc with endplate sclerosis and chronic endplate marrow signal changes. Superimposed epidural lipomatosis effacing CSF the thecal sac at this level. Moderate facet hypertrophy greater on the left. Mild to moderate bilateral L4 foraminal stenosis.  L5-S1: Sacralized. Epidural lipomatosis effacing CSF throughout the L5 and visible sacral spinal canal.  IMPRESSION: 1. Severe bladder distension partially visible. Query bladder outlet obstruction. 2. Suggestion of transitional lumbosacral anatomy with sacralized L5 level. Correlation with radiographs is recommended prior to any operative intervention. 3. No metastatic disease identified in the visualized lumbosacral spine. Possible S2 sclerotic lesion on the left has a benign MRI appearance. 4. 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   Electronically  Signed   By: Genevie Ann M.D.   On: 05/25/2015 15:52   Ct Abdomen Pelvis W Contrast  05/29/2015   CLINICAL DATA:  Incidental bladder outlet obstruction seen on MRI. Chemotherapy for non-small cell lung cancer stage III. Pneumonia. E coli.  EXAM: CT ABDOMEN AND PELVIS WITH CONTRAST  TECHNIQUE: Multidetector CT imaging of the abdomen and pelvis was performed using the standard protocol following bolus administration of intravenous contrast.  CONTRAST:  7m OMNIPAQUE IOHEXOL 300 MG/ML SOLN, 1094mOMNIPAQUE IOHEXOL 300 MG/ML SOLN  COMPARISON:  MRI 05/25/2015, CT of the abdomen and pelvis 05/23/2015  FINDINGS: Lower chest: Heart is enlarged. Coronary artery calcifications are present. No pericardial effusion. There are bilateral pleural effusions. Focal patchy density is identified at the right lung base, similar in appearance to densities seen on recent CT which likely represent infectious process lower radiation pneumonitis.  Upper abdomen: No focal abnormality identified within the liver, spleen, pancreas, adrenal glands, or kidneys. There is atherosclerotic calcification of the renal arteries. Calcifications of the renal pelves are possibly vascular or may represent nonobstructing intrarenal calculi. The gallbladder is present and somewhat contracted.  Gastrointestinal tract: The stomach and small bowel loops are normal in appearance. Colonic loops are normal in appearance.  Pelvis: Urinary bladder is not decompressed by a Foley catheter. There small locules of gas within the bladder. There is no free pelvic fluid. No pelvic adenopathy.  Retroperitoneum: Dense atherosclerotic calcification of the abdominal aorta. No aneurysm.  Abdominal wall: Postoperative changes seen in the anterior abdominal wall.  Osseous structures: Significant degenerative changes throughout the lumbar spine. No suspicious lytic or blastic lesions are identified.  IMPRESSION: 1. Cardiomegaly and coronary artery disease. 2. Bilateral pleural  effusions. 3. Right basilar focal lung opacity consistent with other lung densities seen on recent CT exam. Findings are consistent with infectious or post treatment pneumonitis. 4. No bowel obstruction. 5. Interval decompression of the urinary bladder by a Foley catheter. 6. Atherosclerosis of the abdominal aorta.   Electronically Signed  By: Nolon Nations M.D.   On: 05/29/2015 12:19    Microbiology: Recent Results (from the past 240 hour(s))  Urine culture     Status: None   Collection Time: 05/23/15 12:47 PM  Result Value Ref Range Status   Specimen Description URINE, CLEAN CATCH  Final   Special Requests NONE  Final   Culture   Final    20,000 COLONIES/mL VANCOMYCIN RESISTANT ENTEROCOCCUS ISOLATED Performed at [facility]    Report Status 05/27/2015 FINAL  Final   Organism ID, Bacteria VANCOMYCIN RESISTANT ENTEROCOCCUS ISOLATED  Final      Susceptibility   Vancomycin resistant enterococcus isolated - MIC*    AMPICILLIN <=2 SENSITIVE Sensitive     LEVOFLOXACIN >=8 RESISTANT Resistant     NITROFURANTOIN <=16 SENSITIVE Sensitive     VANCOMYCIN >=32 RESISTANT Resistant     LINEZOLID 2 SENSITIVE Sensitive     * 20,000 COLONIES/mL VANCOMYCIN RESISTANT ENTEROCOCCUS ISOLATED  Blood culture (routine x 2)     Status: None   Collection Time: 05/23/15  4:44 PM  Result Value Ref Range Status   Specimen Description BLOOD RIGHT PORTA CATH  Final   Special Requests BOTTLES DRAWN AEROBIC AND ANAEROBIC 5ML  Final   Culture   Final    NO GROWTH 5 DAYS Performed at [facility]    Report Status 05/28/2015 FINAL  Final  Blood culture (routine x 2)     Status: None   Collection Time: 05/23/15  5:07 PM  Result Value Ref Range Status   Specimen Description BLOOD LEFT ANTECUBITAL  Final   Special Requests BOTTLES DRAWN AEROBIC AND ANAEROBIC 5ML  Final   Culture   Final    ESCHERICHIA COLI CRITICAL RESULT CALLED TO, READ BACK BY AND VERIFIED WITH: K BEESON 05/25/15 @ 1400 M  VESTAL Performed at [facility]    Report Status 05/29/2015 FINAL  Final   Organism ID, Bacteria ESCHERICHIA COLI  Final      Susceptibility   Escherichia coli - MIC*    AMPICILLIN >=32 RESISTANT Resistant     CEFAZOLIN <=4 SENSITIVE Sensitive     CEFEPIME <=1 SENSITIVE Sensitive     CEFTAZIDIME <=1 SENSITIVE Sensitive     CEFTRIAXONE <=1 SENSITIVE Sensitive     CIPROFLOXACIN <=0.25 SENSITIVE Sensitive     GENTAMICIN <=1 SENSITIVE Sensitive     IMIPENEM <=0.25 SENSITIVE Sensitive     TRIMETH/SULFA >=320 RESISTANT Resistant     AMPICILLIN/SULBACTAM 8 SENSITIVE Sensitive     PIP/TAZO <=4 SENSITIVE Sensitive     * ESCHERICHIA COLI  Clostridium Difficile by PCR (not at [facility])     Status: None   Collection Time: 05/24/15  6:24 AM  Result Value Ref Range Status   C difficile by pcr NEGATIVE NEGATIVE Final  Culture, Urine     Status: None   Collection Time: 05/27/15 12:22 PM  Result Value Ref Range Status   Specimen Description URINE, CATHETERIZED  Final   Special Requests NONE  Final   Culture   Final    NO GROWTH 1 DAY Performed at [facility]    Report Status 05/28/2015 FINAL  Final     Labs: Basic Metabolic Panel:  Recent Labs Lab 05/24/15 0630 05/25/15 0440 05/26/15 0525 05/27/15 0520 05/28/15 0501  NA 136 138 140 136 134*  K 3.1* 3.8 4.2 4.3 4.4  CL 102 108 112* 108 106  CO2 '23 24 22 '$ 20* 20*  GLUCOSE 121* 104* 92 92  92  BUN 25* '20 14 10 7  '$ CREATININE 1.25* 1.15 1.06 1.01 0.88  CALCIUM 8.2* 8.4* 8.4* 8.4* 8.5*  MG 1.4* 2.4 1.8 1.9  --    Liver Function Tests:  Recent Labs Lab 05/23/15 2000  AST 29  ALT 39  ALKPHOS 78  BILITOT 2.0*  PROT 6.4*  ALBUMIN 3.2*   No results for input(s): LIPASE, AMYLASE in the last 168 hours. No results for input(s): AMMONIA in the last 168 hours. CBC:  Recent Labs Lab 05/23/15 1313  05/25/15 0440 05/26/15 0525 05/27/15 0520 05/27/15 1817 05/28/15 0501 05/29/15 0508  WBC 0.7*  < > 3.4* 8.4 7.8  --   7.4 6.9  NEUTROABS 0.2*  --  2.4  --   --   --   --   --   HGB 8.8*  < > 7.3* 7.1* 6.7* 7.5* 7.8* 8.2*  HCT 25.6*  < > 22.0* 21.0* 19.8* 22.0* 22.5* 23.8*  MCV 90.5  < > 91.7 93.3 92.5  --  88.6 88.8  PLT 88*  < > 69* 93* 58*  --  51* 59*  < > = values in this interval not displayed. Cardiac Enzymes:  Recent Labs Lab 05/23/15 2000 05/24/15 0022 05/24/15 0630  TROPONINI 0.07* 0.08* 0.06*   BNP: BNP (last 3 results)  Recent Labs  04/28/15 1300 05/23/15 1313  BNP 293.9* 141.1*    ProBNP (last 3 results)  Recent Labs  03/29/15 1133 04/05/15 1134  PROBNP 543.0* 252.0*    CBG: No results for input(s): GLUCAP in the last 168 hours.   Signed:  Brenisha Tsui K  [facility] 05/29/2015, 1:32 PM

## 2015-05-29 NOTE — Care Management Note (Signed)
Case Management Note  Patient Details  Name: JONI NORROD MRN: [id number] Date of Birth: [birth year]  Subjective/Objective:   PNA         Action/Plan: [facility] RN/PT  Expected Discharge Date:  05/29/15             Expected Discharge Plan:     In-House Referral:     Discharge planning Services  CM Consult  Post Acute Care Choice:    Choice offered to:     DME Arranged:    DME Agency:     HH Arranged:    HH Agency:     Status of Service:  Completed, signed off  Medicare Important Message Given:    Date Medicare IM Given:    Medicare IM give by:    Date Additional Medicare IM Given:    Additional Medicare Important Message give by:     If discussed at [place] of Stay Meetings, dates discussed:    Additional Comments: Patient is active with [facility] for PT/ RN,   MD  Adding home health  Aide for this discharge.  Spoke to patient he was acceptable in continuing service with [facility], patient has equipment from previous admit.  Notified [facility] rep.  of discharge and the need for added service of [facility] aide.  No additional CM needs at this time .    Corine Shelter, RN 05/29/2015, 1:35 PM

## 2015-05-30 ENCOUNTER — Telehealth: Payer: Self-pay | Admitting: *Deleted

## 2015-05-30 NOTE — Telephone Encounter (Signed)
Transition Care Management Follow-up Telephone Call  How have you been since you were released from the hospital? Per wife, patient is really tired, which they expected.  He is coughing in spells, denies chest pain or shortness of breath.     Do you understand why you were in the hospital? Pneumonia    Do you understand the discharge instrcutions? YES   Items Reviewed:  Medications reviewed: YES   Allergies reviewed:  YES   Dietary changes reviewed: YES - heart healthy, low sodium diet   Referrals reviewed: YES - patient's wife noted that they were to follow-up with Urology also, number given and patient told to call back if any issues scheduling appointment     Functional Questionnaire:   Activities of Daily Living (ADLs):   He states they are independent in the following: communication, feeding, restroom (has colostomy bag) States they require assistance with the following: medications, transportation (wife drives)    Any transportation issues/concerns?: NO   Any patient concerns? NO   Confirmed importance and date/time of follow-up visits scheduled: YES- follow-up scheduled with Dr. Larose Kells 05/31/15 at 11:30   Confirmed with patient if condition begins to worsen call PCP or go to the ER.  Patient was given the Call-a-Nurse line [phone]: YES

## 2015-05-31 ENCOUNTER — Ambulatory Visit (INDEPENDENT_AMBULATORY_CARE_PROVIDER_SITE_OTHER): Payer: BLUE CROSS/BLUE SHIELD | Admitting: Internal Medicine

## 2015-05-31 ENCOUNTER — Encounter: Payer: Self-pay | Admitting: Internal Medicine

## 2015-05-31 VITALS — BP 124/62 | HR 92 | Temp 97.5°F | Ht 68.0 in | Wt 232.2 lb

## 2015-05-31 DIAGNOSIS — J189 Pneumonia, unspecified organism: Secondary | ICD-10-CM

## 2015-05-31 DIAGNOSIS — C3411 Malignant neoplasm of upper lobe, right bronchus or lung: Secondary | ICD-10-CM

## 2015-05-31 DIAGNOSIS — I482 Chronic atrial fibrillation, unspecified: Secondary | ICD-10-CM

## 2015-05-31 DIAGNOSIS — D649 Anemia, unspecified: Secondary | ICD-10-CM

## 2015-05-31 LAB — CBC WITH DIFFERENTIAL/PLATELET
BASOS ABS: 0 10*3/uL (ref 0.0–0.1)
BASOS PCT: 0.3 % (ref 0.0–3.0)
EOS ABS: 0 10*3/uL (ref 0.0–0.7)
EOS PCT: 0.2 % (ref 0.0–5.0)
HCT: 26.4 % — ABNORMAL LOW (ref 39.0–52.0)
Hemoglobin: 8.8 g/dL — ABNORMAL LOW (ref 13.0–17.0)
Lymphocytes Relative: 8.8 % — ABNORMAL LOW (ref 12.0–46.0)
Lymphs Abs: 0.6 10*3/uL — ABNORMAL LOW (ref 0.7–4.0)
MCHC: 33.3 g/dL (ref 30.0–36.0)
MCV: 90.6 fl (ref 78.0–100.0)
MONO ABS: 0.5 10*3/uL (ref 0.1–1.0)
Monocytes Relative: 6.4 % (ref 3.0–12.0)
Neutro Abs: 6.1 10*3/uL (ref 1.4–7.7)
Neutrophils Relative %: 84.3 % — ABNORMAL HIGH (ref 43.0–77.0)
PLATELETS: 107 10*3/uL — AB (ref 150.0–400.0)
RBC: 2.92 Mil/uL — AB (ref 4.22–5.81)
RDW: 20.4 % — ABNORMAL HIGH (ref 11.5–15.5)
WBC: 7.3 10*3/uL (ref 4.0–10.5)

## 2015-05-31 MED ORDER — DIGOXIN 125 MCG PO TABS
0.1250 mg | ORAL_TABLET | Freq: Every day | ORAL | Status: DC
Start: 2015-05-31 — End: 2016-03-08

## 2015-05-31 NOTE — Patient Instructions (Signed)
Go to the lab  Take Metamucil: 3 capsules daily which your breakfast. Continue with a probiotic called FLORASTOR If the diarrhea is not gradually improving in the next 10-15 days, please call for a referral to the GI doctor

## 2015-05-31 NOTE — Progress Notes (Signed)
Subjective:    Patient ID: Alejandro [name], male    DOB: 10/30/1946, 69 y.o.   MRN: [id number]  DOS:  05/31/2015 Type of visit - description : Hospital follow-up, admitted 05/23/2015, stay for 6 days. Interval history: Seen at the hospital with generalized weakness and chest pain, eventually he was diagnosed with pneumonia noted on CT chest on admission. Later on, blood culture showed Escherichia coli multidrug sensitive, abx changed to zithromax and  Rocephin based on cultures. Was seen by cardiology due to chest pain, CT negative for PE. Troponin slightly elevated likely due to demand ischemia. No further workup was recommended. CHF was felt to be stable. History of lung cancer, had anemia and pancytopenia,  hemoglobin went down to 6.7 and he got  1 PRBC. Later on, had + positive stools for blood, after discussing with GI the recommended PPIs and monitor hemoglobin. Also, he had a suspected bladder outlet obstructions, he failed a voiding trial. He was recommended to see urology as an outpatient  Labs reviewed: Last potassium 4.4, creatinine 0.8. Last hemoglobin 8.8. Platelets 59.  CT 05/23/2015 1. New patchy consolidation in the right upper lobe, right middle lobe and superior right lower with differential including multifocal pneumonia, aspiration pneumonitis, or delayed postradiation change. Lung cancer recurrence is not favored. Unilateral drug reaction would also be unlikely. 2. No evidence of acute pulmonary embolism.  CT 05/29/2015: 1. Cardiomegaly and coronary artery disease. 2. Bilateral pleural effusions. 3. Right basilar focal lung opacity consistent with other lung densities seen on recent CT exam. Findings are consistent with infectious or post treatment pneumonitis. 4. No bowel obstruction. 5. Interval decompression of the urinary bladder by a Foley catheter. 6. Atherosclerosis of the abdominal aorta.  Review of Systems Here with his wife, since he left the  hospital he is feeling slightly better. Still has the catheter in place, urine is not grossly bloody No fever chills No chest pain, difficulty breathing slightly improved. He has watery diarrhea 1 or 2 episodes a day, that is going on since before the admission to hospital. No nausea, vomiting or blood in the stools. Mild cough, no sputum production. No blood in the stools that he can tell.   Past Medical History  Diagnosis Date  . Systolic heart failure   . 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 Dyslipidemia   . HTN (hypertension)   . Atrial fibrillation   . Tobacco abuse   . Alcohol abuse   . H/O hiatal hernia   . Myocardial infarction   . Dysrhythmia     atrial fib  . Stroke 5/15  . CHF (congestive heart failure)   . Shortness of breath     occ  . GERD (gastroesophageal reflux disease)   . S/P chemotherapy, time since 4-12 weeks   . Cancer     around nose.  New dx of lung cancer  . Non-small cell carcinoma of lung, stage 3 12/16/2014    Past Surgical History  Procedure Laterality Date  . Umbilical hernia repair  2010  . Hernia repair  2012  . Ventral hernia repair N/A 01/22/2013    Procedure: HERNIA REPAIR VENTRAL ADULT;  Surgeon: [name], MD;  Location: [facility];  Service: General;  Laterality: N/A;  . Application of a-cell of chest/abdomen N/A 01/22/2013    Procedure: APPLICATION OF A-CELL OF CHEST/ABDOMEN;  Surgeon: [name], MD;  Location: [facility];  Service: General;  Laterality: N/A;  . Cystoscopy N/A 01/22/2013    Procedure: CYSTOSCOPY;  Surgeon: [name], MD;  Location: [place];  Service: Urology;  Laterality: N/A;  Cystoscopy with balloon dilation. Insertion of coude catheter.  . Craniotomy Right 04/19/2014    Procedure: CRANIOTOMY HEMATOMA EVACUATION SUBDURAL;  Surgeon: [name], MD;  Location: [place] [facility];  Service: Neurosurgery;  Laterality: Right;  right  . Craniotomy Right 04/23/2014    Procedure: Craniotomy for  Intracerebral Hemorrhage;  Surgeon: [name], MD;  Location: [place] [facility];  Service: Neurosurgery;  Laterality: Right;  . 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 Coronary artery bypass graft  12/24/2008    4 vessel  . Coronary artery bypass graft  2012?  Marland Kitchen Craniotomy N/A 09/13/2014    Procedure: CRANIOTOMY BONE FLAP/PROSTHETIC PLATE;  Surgeon: [name], MD;  Location: [place] [facility];  Service: Neurosurgery;  Laterality: N/A;  . Video bronchoscopy with endobronchial ultrasound N/A 11/09/2014    Procedure: VIDEO BRONCHOSCOPY WITH ENDOBRONCHIAL ULTRASOUND;  Surgeon: Collene Gobble, MD;  Location: [place];  Service: Thoracic;  Laterality: N/A;    History   Social History  . Marital Status: Married    Spouse Name: N/A  . Number of Children: 2  . Years of Education: N/A   Occupational History  . [employer] - retired    Social History Main Topics  . Smoking status: Former Smoker -- 1.00 packs/day for 50 years    Types: Cigarettes    Quit date: 04/19/2013  . Smokeless tobacco: Never Used  . Alcohol Use: No     Comment: quit  . Drug Use: No  . Sexual Activity: Not on file   Other Topics Concern  . Not on file   Social History Narrative   Lives in [place], [place] with wife.  Ambulates with a walker.        Medication List       This list is accurate as of: 05/31/15  5:34 PM.  Always use your most recent med list.               albuterol 108 (90 BASE) MCG/ACT inhaler  Commonly known as:  PROVENTIL HFA;VENTOLIN HFA  Inhale 2 puffs into the lungs every 4 (four) hours as needed for wheezing or shortness of breath.     aspirin 81 MG tablet  Take 81 mg by mouth daily.     cefUROXime 500 MG tablet  Commonly known as:  CEFTIN  Take 1 tablet (500 mg total) by mouth 2 (two) times daily with a meal.      digoxin 0.125 MG tablet  Commonly known as:  LANOXIN  Take 1 tablet (0.125 mg total) by mouth daily.     FLUoxetine 20 MG capsule  Commonly known as:  PROZAC  Take 1 capsule (20 mg total) by mouth daily.     furosemide 40 MG tablet  Commonly known as:  LASIX  Take 1 tablet (40 mg total) by mouth daily.     levETIRAcetam 500 MG tablet  Commonly known as:  KEPPRA  Take 1 tablet (500 mg total) by mouth 2 (two) times daily.     lidocaine-prilocaine cream  Commonly known as:  EMLA  Apply 1 application topically as needed.     metoprolol 50 MG tablet  Commonly known as:  LOPRESSOR  Take 25 mg by mouth 2 (two) times daily.     multivitamin with minerals Tabs tablet  Take 1 tablet by mouth daily.     oxyCODONE 5 MG immediate release tablet  Commonly known as:  Oxy IR/ROXICODONE  Take 1 tablet (5 mg total) by mouth every 4 (four) hours as needed for moderate pain.     pantoprazole 40 MG tablet  Commonly known as:  PROTONIX  Take 1 tablet (40 mg total) by mouth daily.     saccharomyces boulardii 250 MG capsule  Commonly known as:  FLORASTOR  Take 1 capsule (250 mg total) by mouth 2 (two) times daily.     tamsulosin 0.4 MG Caps capsule  Commonly known as:  FLOMAX  Take 1 capsule (0.4 mg total) by mouth daily.     UNABLE TO FIND  Med Name:Wheelchair Per medical necessity patient requires wheelchair to all perform daily activities. Pt is able to self propel chair. Unable to use can or walker due to balance           Objective:   Physical Exam BP 124/62 mmHg  Pulse 92  Temp(Src) 97.5 F (36.4 C) (Oral)  Ht '5\' 8"'$  (1.727 m)  Wt 232 lb 4 oz (105.348 kg)  BMI 35.32 kg/m2  SpO2 97%  General:   Well developed, , sitting in a wheelchair, pale looking, nontoxic or distress  HEENT:  Normocephalic . Face symmetricic Lungs:  CTA B Normal respiratory effort, no intercostal retractions, no accessory muscle use. Heart:  no murmur.  no pretibial edema bilaterally  Abdomen:    Not distended, soft, non-tender. No rebound or rigidity.   Skin:  pale. Not jaundice Neurologic:  Behavior appropriate. No anxious or depressed appearing.      Assessment & Plan:     pneumonia, Diagnosis via CT, on Abx, he seems to be doing well. Has a follow-up CT of the chest in few days ordered by oncology consequently no follow-up x-rays needed  + Blood culture for Escherichia coli, urinary retention Patient is afebrile, he has some additional Ceftin tablets and a follow-up with urology  Diarrhea, Going on since even before he was admitted to the hospital, on probiotics, C. difficile was negative during recent admission Plan: Add Metamucil, if not better will need to be seen by GI  Anemia, + Hemoccults and the hospital Status post 1 PRBC at the hospital, check a CBC . No grossly bloody stools, he does have diarrhea as noted above.  Atrial fibrillation, Needs a refill on digoxin which is provided  Follow-up 4-5 months

## 2015-05-31 NOTE — Progress Notes (Signed)
Pre visit review using our clinic review tool, if applicable. No additional management support is needed unless otherwise documented below in the visit note. 

## 2015-06-01 ENCOUNTER — Telehealth: Payer: Self-pay | Admitting: Internal Medicine

## 2015-06-01 NOTE — Telephone Encounter (Signed)
pt called to ask if he still needs ct since he had one on 6.20.Marland Kitchen..i forwarded him to desk nurse

## 2015-06-03 ENCOUNTER — Other Ambulatory Visit: Payer: Self-pay | Admitting: Physician Assistant

## 2015-06-03 ENCOUNTER — Inpatient Hospital Stay (HOSPITAL_COMMUNITY)
Admission: RE | Admit: 2015-06-03 | Discharge: 2015-06-03 | Disposition: A | Discharge status: Home / Self Care | Payer: Medicare Other | Source: Ambulatory Visit | Attending: Physician Assistant | Admitting: Physician Assistant

## 2015-06-03 DIAGNOSIS — C3411 Malignant neoplasm of upper lobe, right bronchus or lung: Secondary | ICD-10-CM

## 2015-06-07 ENCOUNTER — Ambulatory Visit (HOSPITAL_BASED_OUTPATIENT_CLINIC_OR_DEPARTMENT_OTHER): Payer: BLUE CROSS/BLUE SHIELD | Admitting: Oncology

## 2015-06-07 ENCOUNTER — Other Ambulatory Visit (HOSPITAL_BASED_OUTPATIENT_CLINIC_OR_DEPARTMENT_OTHER): Payer: BLUE CROSS/BLUE SHIELD

## 2015-06-07 ENCOUNTER — Ambulatory Visit: Payer: BLUE CROSS/BLUE SHIELD

## 2015-06-07 ENCOUNTER — Encounter: Payer: Self-pay | Admitting: Oncology

## 2015-06-07 ENCOUNTER — Ambulatory Visit (HOSPITAL_BASED_OUTPATIENT_CLINIC_OR_DEPARTMENT_OTHER): Payer: BLUE CROSS/BLUE SHIELD

## 2015-06-07 VITALS — BP 117/76 | HR 82 | Temp 97.7°F | Resp 19 | Ht 68.0 in | Wt 231.7 lb

## 2015-06-07 DIAGNOSIS — R110 Nausea: Secondary | ICD-10-CM | POA: Diagnosis not present

## 2015-06-07 DIAGNOSIS — C3411 Malignant neoplasm of upper lobe, right bronchus or lung: Secondary | ICD-10-CM

## 2015-06-07 DIAGNOSIS — R5383 Other fatigue: Secondary | ICD-10-CM

## 2015-06-07 DIAGNOSIS — D630 Anemia in neoplastic disease: Secondary | ICD-10-CM | POA: Diagnosis not present

## 2015-06-07 DIAGNOSIS — E860 Dehydration: Secondary | ICD-10-CM

## 2015-06-07 DIAGNOSIS — C3492 Malignant neoplasm of unspecified part of left bronchus or lung: Secondary | ICD-10-CM

## 2015-06-07 LAB — CBC WITH DIFFERENTIAL/PLATELET
BASO%: 0.4 % (ref 0.0–2.0)
Basophils Absolute: 0 10*3/uL (ref 0.0–0.1)
EOS ABS: 0 10*3/uL (ref 0.0–0.5)
EOS%: 0.7 % (ref 0.0–7.0)
HCT: 26.3 % — ABNORMAL LOW (ref 38.4–49.9)
HGB: 8.8 g/dL — ABNORMAL LOW (ref 13.0–17.1)
LYMPH%: 11.1 % — ABNORMAL LOW (ref 14.0–49.0)
MCH: 30.4 pg (ref 27.2–33.4)
MCHC: 33.6 g/dL (ref 32.0–36.0)
MCV: 90.6 fL (ref 79.3–98.0)
MONO#: 0.7 10*3/uL (ref 0.1–0.9)
MONO%: 11.6 % (ref 0.0–14.0)
NEUT%: 76.2 % — AB (ref 39.0–75.0)
NEUTROS ABS: 4.6 10*3/uL (ref 1.5–6.5)
PLATELETS: 214 10*3/uL (ref 140–400)
RBC: 2.91 10*6/uL — ABNORMAL LOW (ref 4.20–5.82)
RDW: 21.9 % — ABNORMAL HIGH (ref 11.0–14.6)
WBC: 6 10*3/uL (ref 4.0–10.3)
lymph#: 0.7 10*3/uL — ABNORMAL LOW (ref 0.9–3.3)

## 2015-06-07 LAB — COMPREHENSIVE METABOLIC PANEL (CC13)
ALBUMIN: 3.1 g/dL — AB (ref 3.5–5.0)
ALK PHOS: 86 U/L (ref 40–150)
ALT: 38 U/L (ref 0–55)
AST: 31 U/L (ref 5–34)
Anion Gap: 11 mEq/L (ref 3–11)
BUN: 12.4 mg/dL (ref 7.0–26.0)
CO2: 23 mEq/L (ref 22–29)
Calcium: 9.4 mg/dL (ref 8.4–10.4)
Chloride: 104 mEq/L (ref 98–109)
Creatinine: 1.2 mg/dL (ref 0.7–1.3)
EGFR: 63 mL/min/{1.73_m2} — ABNORMAL LOW (ref >90)
GLUCOSE: 103 mg/dL (ref 70–140)
Potassium: 3.1 mEq/L — ABNORMAL LOW (ref 3.5–5.1)
SODIUM: 138 meq/L (ref 136–145)
Total Bilirubin: 0.52 mg/dL (ref 0.20–1.20)
Total Protein: 6.7 g/dL (ref 6.4–8.3)

## 2015-06-07 MED ORDER — SODIUM CHLORIDE 0.9 % IV SOLN
INTRAVENOUS | Status: DC
  Administered 2015-06-07: 12:00:00 via INTRAVENOUS

## 2015-06-07 MED ORDER — SODIUM CHLORIDE 0.9 % IV SOLN
Freq: Once | INTRAVENOUS | Status: AC
  Administered 2015-06-07: 12:00:00 via INTRAVENOUS
  Filled 2015-06-07: qty 4

## 2015-06-07 MED ORDER — HEPARIN SOD (PORK) LOCK FLUSH 100 UNIT/ML IV SOLN
500.0000 [IU] | Freq: Once | INTRAVENOUS | Status: DC
Start: 2015-06-07 — End: 2015-06-07
  Filled 2015-06-07: qty 5

## 2015-06-07 MED ORDER — SODIUM CHLORIDE 0.9 % IJ SOLN
10.0000 mL | INTRAMUSCULAR | Status: DC | PRN
  Administered 2015-06-07: 10 mL via INTRAVENOUS
  Filled 2015-06-07: qty 10

## 2015-06-07 MED ORDER — HEPARIN SOD (PORK) LOCK FLUSH 100 UNIT/ML IV SOLN
500.0000 [IU] | Freq: Once | INTRAVENOUS | Status: AC
  Administered 2015-06-07: 500 [IU] via INTRAVENOUS
  Filled 2015-06-07: qty 5

## 2015-06-07 NOTE — Patient Instructions (Signed)

## 2015-06-07 NOTE — Patient Instructions (Signed)

## 2015-06-07 NOTE — Progress Notes (Signed)
Quick Note:  Call patient with the result and Rx K Dur 20 meq po qd X 7 days. ______

## 2015-06-07 NOTE — Progress Notes (Signed)
[facility] [phone]   Fax:([phone]) [place], [place] [address] [place] [address]  DIAGNOSIS: Non-small cell carcinoma of lung, stage 3  Staging form: Lung, AJCC 7th Edition  Clinical stage from 12/16/2014: Stage IIIA (T1b, N2, M0) - Signed by Curt Bears, MD on 12/18/2014  Staging comments: Squamous cell carcinoma  PRIOR THERAPY: Concurrent chemoradiation with weekly carboplatin for an AUC of 2 and paclitaxel 45 mg/m2.  CURRENT THERAPY: Consolidation chemotherapy with carboplatin AUC of 5 and paclitaxel 175 mg/m given every 3 weeks with Neulasta support started on 03/28/2015. Status post 3 cycles  INTERVAL HISTORY: RAMAJ FRANGOS 69 y.o. male returns to the clinic today for follow-up visit accompanied by his wife. He is status post 3 cycles of  consolidation chemotherapy with carboplatin and Taxol. He was recently hospitalized for healthcare associated pneumonia as well as a possible seizure while at home. He is completes his anti-biotics. Denies any recent fever. He reports feeling dizzy at times and is overall weak and fatigued. His appetite has declined his lost about 6 pounds since we last saw him in our office. His wife states that he has physical therapy coming into his home but he has been too tired to work with them, but they will be returning in the near future. He denies chest pain and shortness of breath at rest. He has dyspnea on exertion. Denies cough and hemoptysis. During his hospitalization he was placed on Keppra for seizures and he has not had any recurrent seizure like activity. Denies abdominal pain, but felt that he was nauseated and had some dry heaves here in our office today. This is the first time this has happened since he has been home from the hospital.   MEDICAL HISTORY: Past Medical History  Diagnosis Date  . Systolic heart failure   . 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 Dyslipidemia   . HTN (hypertension)   . Atrial fibrillation   . Tobacco abuse   . Alcohol abuse   . H/O hiatal hernia   . Myocardial infarction   . Dysrhythmia     atrial fib  . Stroke 5/15  . CHF (congestive heart failure)   . Shortness of breath     occ  . GERD (gastroesophageal reflux disease)   . S/P chemotherapy, time since 4-12 weeks   . Cancer     around nose.  New dx of lung cancer  . Non-small cell carcinoma of lung, stage 3 12/16/2014    ALLERGIES:  has No Known Allergies.  MEDICATIONS:  Current Outpatient Prescriptions  Medication Sig Dispense Refill  . albuterol (PROVENTIL HFA;VENTOLIN HFA) 108 (90 BASE) MCG/ACT inhaler Inhale 2 puffs into the lungs every 4 (four) hours as needed for wheezing or shortness of breath. 1 Inhaler 6  . aspirin 81 MG tablet Take 81 mg by mouth daily.    . digoxin (LANOXIN) 0.125 MG tablet Take 1 tablet (0.125 mg total) by mouth daily. 30 tablet 6  . FLUoxetine (PROZAC) 20 MG capsule Take 1 capsule (20 mg total) by mouth daily. 30 capsule 6  . furosemide (LASIX) 40 MG tablet Take 1 tablet (40 mg total) by mouth daily. 45 tablet 6  . levETIRAcetam (KEPPRA) 500 MG tablet Take 1 tablet (500 mg total) by mouth 2 (two) times daily. 60 tablet 6  . lidocaine-prilocaine (EMLA) cream Apply 1  application topically as needed. (Patient taking differently: Apply 1 application topically as needed (pain). ) 30 g 2  . metoprolol (LOPRESSOR) 50 MG tablet Take 25 mg by mouth 2 (two) times daily.    . Multiple Vitamin (MULTIVITAMIN WITH MINERALS) TABS tablet Take 1 tablet by mouth daily.    Marland Kitchen oxyCODONE (OXY IR/ROXICODONE) 5 MG immediate release tablet Take 1 tablet (5 mg total) by mouth every 4 (four) hours as needed for moderate pain. 20 tablet 0  . pantoprazole (PROTONIX) 40 MG tablet Take 1 tablet (40 mg total) by mouth daily. 30 tablet 0  . saccharomyces boulardii (FLORASTOR) 250 MG capsule Take 1 capsule (250 mg total) by mouth  2 (two) times daily. 30 capsule 0  . tamsulosin (FLOMAX) 0.4 MG CAPS capsule Take 1 capsule (0.4 mg total) by mouth daily. 30 capsule 0   No current facility-administered medications for this visit.   Facility-Administered Medications Ordered in Other Visits  Medication Dose Route Frequency Provider Last Rate Last Dose  . 0.9 %  sodium chloride infusion   Intravenous Continuous Maryanna Shape, NP   Stopped at 06/07/15 1400  . sodium chloride 0.9 % injection 10 mL  10 mL Intravenous PRN Curt Bears, MD   10 mL at 06/07/15 1422    SURGICAL HISTORY:  Past Surgical History  Procedure Laterality Date  . Umbilical hernia repair  2010  . Hernia repair  2012  . Ventral hernia repair N/A 01/22/2013    Procedure: HERNIA REPAIR VENTRAL ADULT;  Surgeon: [name], MD;  Location: [facility];  Service: General;  Laterality: N/A;  . Application of a-cell of chest/abdomen N/A 01/22/2013    Procedure: APPLICATION OF A-CELL OF CHEST/ABDOMEN;  Surgeon: [name], MD;  Location: [facility];  Service: General;  Laterality: N/A;  . Cystoscopy N/A 01/22/2013    Procedure: Consuela Mimes;  Surgeon: [name], MD;  Location: [facility];  Service: Urology;  Laterality: N/A;  Cystoscopy with balloon dilation. Insertion of coude catheter.  . Craniotomy Right 04/19/2014    Procedure: CRANIOTOMY HEMATOMA EVACUATION SUBDURAL;  Surgeon: [name], MD;  Location: [facility];  Service: Neurosurgery;  Laterality: Right;  right  . Craniotomy Right 04/23/2014    Procedure: Craniotomy for Intracerebral Hemorrhage;  Surgeon: [name], MD;  Location: [facility];  Service: Neurosurgery;  Laterality: Right;  . 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 Coronary artery bypass graft  12/24/2008    4 vessel  . Coronary artery bypass graft  2012?  Marland Kitchen Craniotomy N/A 09/13/2014     Procedure: CRANIOTOMY BONE FLAP/PROSTHETIC PLATE;  Surgeon: [name], MD;  Location: [facility];  Service: Neurosurgery;  Laterality: N/A;  . Video bronchoscopy with endobronchial ultrasound N/A 11/09/2014    Procedure: VIDEO BRONCHOSCOPY WITH ENDOBRONCHIAL ULTRASOUND;  Surgeon: Collene Gobble, MD;  Location: [facility];  Service: Thoracic;  Laterality: N/A;    REVIEW OF SYSTEMS:  Constitutional: positive for fatigue and malaise Eyes: negative Ears, nose, mouth, throat, and face: negative Respiratory: positive for dyspnea on exertion Cardiovascular: negative Gastrointestinal: positive for nausea Genitourinary:positive for urinary incontinence Integument/breast: negative Hematologic/lymphatic: negative Musculoskeletal:negative Neurological: positive for dizziness and weakness Behavioral/Psych: negative Endocrine: negative Allergic/Immunologic: negative   PHYSICAL EXAMINATION: General appearance: alert, cooperative, fatigued and no distress Head: Normocephalic, without obvious abnormality,  atraumatic Neck: no adenopathy, no JVD, supple, symmetrical, trachea midline and thyroid not enlarged, symmetric, no tenderness/mass/nodules Lymph nodes: Cervical, supraclavicular, and axillary nodes normal. Resp: clear to auscultation bilaterally Back: symmetric, no curvature. ROM normal. No CVA tenderness. Cardio: regular rate and rhythm, S1, S2 normal, no murmur, click, rub or gallop GI: soft, non-tender; bowel sounds normal; no masses,  no organomegaly Extremities: extremities normal, atraumatic, no cyanosis or edema Neurologic: Grossly normal  ECOG PERFORMANCE STATUS: 2 - Symptomatic, <50% confined to bed  Blood pressure 117/76, pulse 82, temperature 97.7 F (36.5 C), temperature source Oral, resp. rate 19, height '5\' 8"'$  (1.727 m), weight 231 lb 11.2 oz (105.098 kg), SpO2 99 %.  LABORATORY DATA: Lab Results  Component Value Date   WBC 6.0 06/07/2015   HGB 8.8* 06/07/2015   HCT 26.3*  06/07/2015   MCV 90.6 06/07/2015   PLT 214 06/07/2015      Chemistry      Component Value Date/Time   NA 138 06/07/2015 1106   NA 134* 05/28/2015 0501   K 3.1* 06/07/2015 1106   K 4.4 05/28/2015 0501   CL 106 05/28/2015 0501   CO2 23 06/07/2015 1106   CO2 20* 05/28/2015 0501   BUN 12.4 06/07/2015 1106   BUN 7 05/28/2015 0501   CREATININE 1.2 06/07/2015 1106   CREATININE 0.88 05/28/2015 0501   CREATININE 0.99 09/26/2012 1526      Component Value Date/Time   CALCIUM 9.4 06/07/2015 1106   CALCIUM 8.5* 05/28/2015 0501   ALKPHOS 86 06/07/2015 1106   ALKPHOS 78 05/23/2015 2000   AST 31 06/07/2015 1106   AST 29 05/23/2015 2000   ALT 38 06/07/2015 1106   ALT 39 05/23/2015 2000   BILITOT 0.52 06/07/2015 1106   BILITOT 2.0* 05/23/2015 2000       RADIOGRAPHIC STUDIES: Dg Chest 2 View  05/23/2015   CLINICAL DATA:  Short of breath. Diarrhea. RIGHT-sided lung cancer. Left-sided chest pain. Weakness.  EXAM: CHEST  2 VIEW  COMPARISON:  Multiple priors dating back to 09/07/2014.  FINDINGS: Cardiopericardial silhouette enlarged. Median sternotomy. Monitoring leads project over the chest. RIGHT IJ power port with the tip in the lower superior vena cava.  There is mild apical lordotic projection on today's chest radiograph. When compared to 05/11/2014, the opacity in the RIGHT mid lung continues to be more prominent than on prior chest radiographs, suggesting progression of tumor in the RIGHT lung. This could also represent radiation changes if patient is undergoing radiation therapy.  IMPRESSION: Short term stability of the RIGHT chest nodule with surrounding airspace opacity. No definite acute cardiopulmonary disease on today's chest radiograph. Chronic cardiomegaly without failure.   Electronically Signed   By: Dereck Ligas M.D.   On: 05/23/2015 13:44   Dg Chest 2 View  05/12/2015   CLINICAL DATA:  Several weeks of cough, follow-up pneumonia, known right upper lobe non-small cell lung  malignancy.  EXAM: CHEST  2 VIEW  COMPARISON:  Chest x-ray of Apr 29, 2015 and CT scan of the chest of March 11, 2015.  FINDINGS: The lungs are mildly hyperinflated. Patchy alveolar opacity in the right mid lung persists. The surrounding increased interstitial densities have improved however. There is increased density in the medial right infrahilar region consistent with partial right lower lobe atelectasis. There is a small right pleural effusion. The left lung is clear. The cardiac silhouette is top-normal in size. The pulmonary vascularity is not engorged. The Port-A-Cath appliance tip projects over the distal  third of the SVC. There are 7 intact sternal wires.  IMPRESSION: 1. Mild interval improvement in the appearance of the right lung. The alveolar opacities have improved and there has been considerable improvement in the surrounding interstitial infiltrates. 2. New subsegmental right lower lobe atelectasis. Persistent small right pleural effusion.   Electronically Signed   By: David  Martinique M.D.   On: 05/12/2015 14:31   Ct Head Wo Contrast  05/23/2015   CLINICAL DATA:  Shortness of breath and chest pain. Increasing weakness. History of brain surgery.  EXAM: CT HEAD WITHOUT CONTRAST  TECHNIQUE: Contiguous axial images were obtained from the base of the skull through the vertex without intravenous contrast.  COMPARISON:  06/01/2014 head CT  FINDINGS: Skull and Sinuses:A right craniotomy bone flap has been replaced. The operative site is unremarkable.  No sinus or mastoid effusion.  Orbits: No acute abnormality.  Brain: No evidence of acute infarction, hemorrhage, hydrocephalus, or mass lesion/mass effect.  Expected evolution of low-density in the right temporal lobe, now with circumscribed margins and negative mass effect, consistent with gliosis. A parenchymal hematoma was noted in this region on 2015 imaging.  Stable pattern of extensive chronic small vessel disease with patchy ischemic gliosis throughout  the bilateral cerebral white matter. There have been numerous remote bilateral cerebellar infarcts.  IMPRESSION: 1. No acute findings. 2. 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 3. Posttraumatic right temporal lobe gliosis.   Electronically Signed   By: Monte Fantasia M.D.   On: 05/23/2015 14:00   Ct Angio Chest Pe W/cm &/or Wo Cm  05/23/2015   CLINICAL DATA:  Left-sided chest pain and short of breath. Elevated D-dimer. Non-small cell lung cancer diagnosis January 2016. Chemotherapy in progress. Radiation therapy complete.  EXAM: CT ANGIOGRAPHY CHEST WITH CONTRAST  TECHNIQUE: Multidetector CT imaging of the chest was performed using the standard protocol during bolus administration of intravenous contrast. Multiplanar CT image reconstructions and MIPs were obtained to evaluate the vascular anatomy.  CONTRAST:  90m OMNIPAQUE IOHEXOL 350 MG/ML SOLN  COMPARISON:  Chest CT 03/11/2015  FINDINGS: Mediastinum/Nodes: No filling defects within the pulmonary arteries to suggest acute pulmonary embolism. No acute findings of the aorta great vessels.  No mediastinal lymphadenopathy. Right lower paratracheal lymph node measures 10 mm short axis decreased from 19 on prior. No pericardial fluid. Post CABG anatomy  Lungs/Pleura: There is patchy foci of could type consolidation with air bronchograms in the right upper lobe, right middle lobe and superior segment of the right lower lobe. There is central para bronchovascular thickening extending from the right hilum. Left lung is clear.  Upper abdomen: Limited view of the liver, kidneys, pancreas are unremarkable. Normal adrenal glands.  Musculoskeletal: No acute osseous abnormality.  Review of the MIP images confirms the above findings.  IMPRESSION: 1. New patchy consolidation in the right upper lobe, right middle lobe and superior right lower with differential including multifocal pneumonia, aspiration pneumonitis, or delayed  postradiation change. Lung cancer recurrence is not favored. Unilateral drug reaction would also be unlikely. 2. No evidence of acute pulmonary embolism.   Electronically Signed   By: SSuzy BouchardM.D.   On: 05/23/2015 15:55   Mr Brain Wo Contrast  05/24/2015   CLINICAL DATA:  69year old male with history of non small cell lung cancer presenting with 2 day history of generalized weakness. Possible seizure. History of subdural hematoma and temporal lobe abscess. Subsequent encounter.  EXAM: MRI HEAD WITHOUT CONTRAST  TECHNIQUE: Multiplanar, multiecho  pulse sequences of the brain and surrounding structures were obtained without intravenous contrast.  COMPARISON:  05/23/2015 head CT.  08/18/2014 brain MR.  FINDINGS: Exam is motion degraded. Patient was not able to complete full sequences.  No acute infarct.  Remote cerebellar infarcts.  Prior right temporal craniotomy. Encephalomalacia right temporal lobe and dilated right temporal horn relatively similar to prior exam. Within the right temporal lobe, 1.4 x 0.8 x 0.5 cm fluid collection. This was at the level where patient had a prior abscess. This appears slightly larger than on the 08/18/2014 examination when this measured 1.4 x 0.7 x 0.4 cm. No restricted motion at this level as may be seen with an abscess.  Significant white matter type changes suggestive of result of small vessel disease and/ or treatment of tumor. No obvious findings of intracranial metastatic disease although with the degree of motion lack of contrast, evaluation is limited.  Global atrophy without hydrocephalus.  Major intracranial vascular structures are patent.  Cervical medullary junction, pituitary region, pineal region and orbital structures unremarkable.  IMPRESSION: Exam is motion degraded. Patient was not able to complete full sequences.  No acute infarct.  Remote cerebellar infarcts. Prominent small vessel disease type changes.  Prior right temporal craniotomy for treatment of  abscess. No definitive findings of intracranial abscess noted at the current time. Small fluid collection superior right temporal is minimally larger than on prior exam. If there are progressive mental status changes, close follow-up imaging with attention to the region recommended.  No obvious findings of intracranial metastatic disease although with lack of contrast, evaluation is limited.  Global atrophy without hydrocephalus.   Electronically Signed   By: Genia Del M.D.   On: 05/24/2015 16:16   Mr Thoracic Spine Wo Contrast  05/24/2015   CLINICAL DATA:  New bowel and bladder incontinence. Weakness. Stage III non-small cell carcinoma of the lung.  EXAM: MRI THORACIC SPINE WITHOUT CONTRAST  TECHNIQUE: Multiplanar, multisequence MR imaging of the thoracic spine was performed. No intravenous contrast was administered.  COMPARISON:  CT scan of the chest dated 05/23/2015  FINDINGS: The thoracic spinal cord appears normal including the tip of the conus at T12-L1. There is no spinal or foraminal stenosis. No facet arthritis. Chronic accentuation of the thoracic kyphosis with narrowing of the disc spaces from T5 sixth through at T9-10 with no disc protrusions or disc bulges. No mass lesions. No metastatic disease. Paraspinal soft tissues are normal.  IMPRESSION: No significant abnormality of the thoracic spine. Chronic accentuation of the thoracic kyphosis with degenerative changes of the discs in the mid thoracic spine.   Electronically Signed   By: Lorriane Shire M.D.   On: 05/24/2015 16:04   Mr Lumbar Spine W Wo Contrast  05/25/2015   CLINICAL DATA:  69 year old male with non-small cell lung cancer. Two day history of generalized weakness, possible seizure. Lumbar back pain. Subsequent encounter.  EXAM: MRI LUMBAR SPINE WITHOUT AND WITH CONTRAST  TECHNIQUE: Multiplanar and multiecho pulse sequences of the lumbar spine were obtained without and with intravenous contrast.  CONTRAST:  10m MULTIHANCE GADOBENATE  DIMEGLUMINE 529 MG/ML IV SOLN  COMPARISON:  Thoracic spine MRI 05/24/2015. Chest CTA 05/23/2015. CT Abdomen and Pelvis 01/28/2013. PET-CT 10/27/2014  FINDINGS: Partially visible moderate to severe distension of the bladder (series 4, image 9).  Other Visualized abdominal viscera and paraspinal soft tissues are within normal limits.  Attempting to utilize the same numbering system as on the recent thoracic study, the L5 level is sacralized and  there are absent or hypoplastic ribs at T12. Normal bone marrow signal in the visible thoracic and lumbar spine. Possible abnormal sclerotic marrow signal in the left S2 sacral ala (series 3, image 17, series 7, image 17), but no associated marrow edema or enhancement (series 4, image 17).  Visualized lower thoracic spinal cord is normal with conus medularis at T12-L1. No abnormal intradural enhancement.  No lower thoracic spinal stenosis.  T12-L1:  Mild epidural lipomatosis.  L1-L2: Moderate epidural lipomatosis narrowing the lateral aspect of the thecal sac (series 5, image 16). Mild facet hypertrophy with bilateral mild L1 foraminal stenosis.  L2-L3: Mild disc bulge and moderate facet and ligament flavum hypertrophy. Epidural lipomatosis narrowing the thecal sac (series 5, image 21). Mild L2 foraminal stenosis.  L3-L4: Mild to moderate circumferential disc bulge and moderate to severe facet hypertrophy greater on the right. Trace facet joint fluid. Epidural lipomatosis narrowing the thecal sac. Mild L3 foraminal stenosis.  L4-L5: Severe chronic disc space loss and vacuum disc with endplate sclerosis and chronic endplate marrow signal changes. Superimposed epidural lipomatosis effacing CSF the thecal sac at this level. Moderate facet hypertrophy greater on the left. Mild to moderate bilateral L4 foraminal stenosis.  L5-S1: Sacralized. Epidural lipomatosis effacing CSF throughout the L5 and visible sacral spinal canal.  IMPRESSION: 1. Severe bladder distension partially  visible. Query bladder outlet obstruction. 2. Suggestion of transitional lumbosacral anatomy with sacralized L5 level. Correlation with radiographs is recommended prior to any operative intervention. 3. No metastatic disease identified in the visualized lumbosacral spine. Possible S2 sclerotic lesion on the left has a benign MRI appearance. 4. 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   Electronically Signed   By: Genevie Ann M.D.   On: 05/25/2015 15:52   Ct Abdomen Pelvis W Contrast  05/29/2015   CLINICAL DATA:  Incidental bladder outlet obstruction seen on MRI. Chemotherapy for non-small cell lung cancer stage III. Pneumonia. E coli.  EXAM: CT ABDOMEN AND PELVIS WITH CONTRAST  TECHNIQUE: Multidetector CT imaging of the abdomen and pelvis was performed using the standard protocol following bolus administration of intravenous contrast.  CONTRAST:  62m OMNIPAQUE IOHEXOL 300 MG/ML SOLN, 1037mOMNIPAQUE IOHEXOL 300 MG/ML SOLN  COMPARISON:  MRI 05/25/2015, CT of the abdomen and pelvis 05/23/2015  FINDINGS: Lower chest: Heart is enlarged. Coronary artery calcifications are present. No pericardial effusion. There are bilateral pleural effusions. Focal patchy density is identified at the right lung base, similar in appearance to densities seen on recent CT which likely represent infectious process lower radiation pneumonitis.  Upper abdomen: No focal abnormality identified within the liver, spleen, pancreas, adrenal glands, or kidneys. There is atherosclerotic calcification of the renal arteries. Calcifications of the renal pelves are possibly vascular or may represent nonobstructing intrarenal calculi. The gallbladder is present and somewhat contracted.  Gastrointestinal tract: The stomach and small bowel loops are normal in appearance. Colonic loops  are normal in appearance.  Pelvis: Urinary bladder is not decompressed by a Foley catheter. There small locules of gas within the bladder. There is no free pelvic fluid. No pelvic adenopathy.  Retroperitoneum: Dense atherosclerotic calcification of the abdominal aorta. No aneurysm.  Abdominal wall: Postoperative changes seen in the anterior abdominal wall.  Osseous structures: Significant degenerative changes throughout the lumbar spine. No suspicious lytic or blastic lesions are identified.  IMPRESSION: 1. Cardiomegaly and coronary artery disease. 2. Bilateral  pleural effusions. 3. Right basilar focal lung opacity consistent with other lung densities seen on recent CT exam. Findings are consistent with infectious or post treatment pneumonitis. 4. No bowel obstruction. 5. Interval decompression of the urinary bladder by a Foley catheter. 6. Atherosclerosis of the abdominal aorta.   Electronically Signed   By: Nolon Nations M.D.   On: 05/29/2015 12:19    ASSESSMENT AND PLAN: This is a very pleasant 69 year old white male with history of stage IIIa non-small cell lung cancer status post course of concurrent chemoradiation with weekly carboplatin and paclitaxel and tolerated his treatment fairly well except for the fatigue from the anemia of neoplastic disease. His recent CT scan of the chest showed improvement in the right upper lobe lung mass as well as some of the mediastinal lymphadenopathy.   He received consolidation chemotherapy with systemic chemotherapy with carboplatin for AUC of 5 and paclitaxel 175 MG/M2 every 3 weeks with Neulasta support. He is status post 3 cycles.  The patient was seen and discussed with Dr. Julien Nordmann. The patient has completed his planned course of chemotherapy at this time. He is still quite weak and fatigued likely due to his recent hospitalization and pneumonia. He is having some nausea today and we will give him a liter of IV fluids along with IV Zofran here in our clinic  today.  The patient's recent CT of the chest from his hospitalization was reviewed and does not show any new evidence of cancer. Given that there is patchy consolidation, we will follow this scan up in approximately 3 months. He will be seen in our office after his CT scan to review the results.  He was advised to call immediately if he has any concerning symptoms in the interval.  The patient voices understanding of current disease status and treatment options and is in agreement with the current care plan.  All questions were answered. The patient knows to call the clinic with any problems, questions or concerns. We can certainly see the patient much sooner if necessary.  Mikey Bussing, NP 06/07/2015  ADDENDUM: Hematology/Oncology Attending: I had a face to face encounter with the patient. I recommended his care plan. This is a very pleasant 68 years old white male with history of stage IIIa non-small cell lung cancer status post concurrent chemoradiation followed by 3 cycles of consolidation chemotherapy. He tolerated his treatment well except for increasing fatigue and weakness as well as chemotherapy-induced anemia. The patient was recently admitted to [facility] with questionable pneumonia and during his hospitalization he had repeat CT scan of the chest just showed no evidence for disease progression. History complaining of increasing fatigue and weakness as well as lack of appetite and dehydration. We will arrange for the patient to receive IV fluid today. I recommended for the patient to continue on observation with repeat CT scan of the chest in 3 months. He was advised to call immediately if he has any concerning symptoms in the interval.  Disclaimer: This note was dictated with voice recognition software. Similar sounding words can inadvertently be transcribed and may not be corrected upon review. Eilleen Kempf., MD 06/11/2015

## 2015-06-08 ENCOUNTER — Ambulatory Visit: Payer: BLUE CROSS/BLUE SHIELD

## 2015-06-08 ENCOUNTER — Encounter: Payer: BLUE CROSS/BLUE SHIELD | Admitting: Physician Assistant

## 2015-06-08 ENCOUNTER — Telehealth: Payer: Self-pay | Admitting: *Deleted

## 2015-06-08 MED ORDER — POTASSIUM CHLORIDE CRYS ER 20 MEQ PO TBCR: 20.0000 meq | EXTENDED_RELEASE_TABLET | Freq: Every day | ORAL | Status: DC

## 2015-06-08 NOTE — Telephone Encounter (Signed)
-----   Message from Curt Bears, MD sent at 06/07/2015  6:52 PM EDT ----- Call patient with the result and Rx K Dur 20 meq po qd X 7 days.

## 2015-06-08 NOTE — Telephone Encounter (Signed)
Called pt regarding Potassium, pt's wife inquired about recent CT scan and results.  Per Alejandro Downer, NP pt had office visit on 7/5 " The patient's recent CT of the chest from his hospitalization was reviewed and does not show any new evidence of cancer. Given that there is patchy consolidation, we will follow this scan up in approximately 3 months. He will be seen in our office after his CT scan to review the results." Discussed with wife above information, wife had concerns about waiting an additional 3 months for new CT scan, states " Alejandro [name] does not want to go through the chemo treatments again, it was just to hard on him. " Acknowledged her concerns, discussed with pt CT scan cannot be repeated sooner due to insurance coverage or unless there are new findings to support need for additional scans as current scan does not show any new evidence of cancer. Wife verbalized understanding.  No further concerns at this time.

## 2015-06-15 ENCOUNTER — Other Ambulatory Visit: Payer: Self-pay | Admitting: Medical Oncology

## 2015-06-15 ENCOUNTER — Telehealth: Payer: Self-pay | Admitting: Medical Oncology

## 2015-06-15 DIAGNOSIS — E876 Hypokalemia: Secondary | ICD-10-CM

## 2015-06-15 MED ORDER — POTASSIUM CHLORIDE CRYS ER 20 MEQ PO TBCR: 20.0000 meq | EXTENDED_RELEASE_TABLET | Freq: Every day | ORAL | Status: DC

## 2015-06-15 NOTE — Telephone Encounter (Signed)
-----   Message from Curt Bears, MD sent at 06/07/2015  6:52 PM EDT ----- Call patient with the result and Rx K Dur 20 meq po qd X 7 days.

## 2015-06-15 NOTE — Telephone Encounter (Signed)
Wife notified.

## 2015-06-20 DIAGNOSIS — I509 Heart failure, unspecified: Secondary | ICD-10-CM | POA: Insufficient documentation

## 2015-06-20 DIAGNOSIS — I2510 Atherosclerotic heart disease of native coronary artery without angina pectoris: Secondary | ICD-10-CM | POA: Insufficient documentation

## 2015-06-20 DIAGNOSIS — C3491 Malignant neoplasm of unspecified part of right bronchus or lung: Secondary | ICD-10-CM | POA: Diagnosis not present

## 2015-06-20 DIAGNOSIS — Z85118 Personal history of other malignant neoplasm of bronchus and lung: Secondary | ICD-10-CM | POA: Insufficient documentation

## 2015-06-22 ENCOUNTER — Telehealth: Payer: Self-pay

## 2015-06-22 NOTE — Telephone Encounter (Signed)
Received fax from [place], physician order request for: "SN 1 W1 1, 2 W 3, 1 W 1, and 3 PRN. Orders reviewed and signed by Dr. Larose Kells and faxed back to [place] at [phone]. Received fax confirmation on 06/22/2015 at 1117. Sent for scanning into chart.

## 2015-06-28 DIAGNOSIS — C3491 Malignant neoplasm of unspecified part of right bronchus or lung: Secondary | ICD-10-CM | POA: Diagnosis not present

## 2015-06-28 DIAGNOSIS — R0602 Shortness of breath: Secondary | ICD-10-CM | POA: Diagnosis not present

## 2015-06-28 DIAGNOSIS — G40909 Epilepsy, unspecified, not intractable, without status epilepticus: Secondary | ICD-10-CM | POA: Diagnosis present

## 2015-06-28 DIAGNOSIS — I5022 Chronic systolic (congestive) heart failure: Secondary | ICD-10-CM | POA: Diagnosis not present

## 2015-06-28 DIAGNOSIS — R918 Other nonspecific abnormal finding of lung field: Secondary | ICD-10-CM | POA: Diagnosis not present

## 2015-06-28 DIAGNOSIS — Z8679 Personal history of other diseases of the circulatory system: Secondary | ICD-10-CM | POA: Diagnosis not present

## 2015-06-28 DIAGNOSIS — I10 Essential (primary) hypertension: Secondary | ICD-10-CM | POA: Diagnosis not present

## 2015-06-28 DIAGNOSIS — C3490 Malignant neoplasm of unspecified part of unspecified bronchus or lung: Secondary | ICD-10-CM | POA: Diagnosis not present

## 2015-06-28 DIAGNOSIS — I4891 Unspecified atrial fibrillation: Secondary | ICD-10-CM | POA: Diagnosis not present

## 2015-06-28 DIAGNOSIS — Z87891 Personal history of nicotine dependence: Secondary | ICD-10-CM | POA: Diagnosis not present

## 2015-06-28 DIAGNOSIS — R590 Localized enlarged lymph nodes: Secondary | ICD-10-CM | POA: Diagnosis not present

## 2015-06-28 DIAGNOSIS — W19XXXD Unspecified fall, subsequent encounter: Secondary | ICD-10-CM | POA: Diagnosis not present

## 2015-06-28 DIAGNOSIS — I482 Chronic atrial fibrillation: Secondary | ICD-10-CM | POA: Diagnosis not present

## 2015-06-28 DIAGNOSIS — Z8673 Personal history of transient ischemic attack (TIA), and cerebral infarction without residual deficits: Secondary | ICD-10-CM | POA: Diagnosis not present

## 2015-06-28 DIAGNOSIS — Z7982 Long term (current) use of aspirin: Secondary | ICD-10-CM | POA: Diagnosis not present

## 2015-06-28 DIAGNOSIS — Z9221 Personal history of antineoplastic chemotherapy: Secondary | ICD-10-CM | POA: Diagnosis not present

## 2015-06-28 DIAGNOSIS — C3411 Malignant neoplasm of upper lobe, right bronchus or lung: Secondary | ICD-10-CM | POA: Diagnosis not present

## 2015-06-28 DIAGNOSIS — I517 Cardiomegaly: Secondary | ICD-10-CM | POA: Diagnosis not present

## 2015-06-28 DIAGNOSIS — I2699 Other pulmonary embolism without acute cor pulmonale: Secondary | ICD-10-CM | POA: Diagnosis not present

## 2015-06-28 DIAGNOSIS — Z951 Presence of aortocoronary bypass graft: Secondary | ICD-10-CM | POA: Diagnosis not present

## 2015-06-28 DIAGNOSIS — G629 Polyneuropathy, unspecified: Secondary | ICD-10-CM | POA: Diagnosis present

## 2015-06-28 DIAGNOSIS — N400 Enlarged prostate without lower urinary tract symptoms: Secondary | ICD-10-CM | POA: Diagnosis not present

## 2015-06-28 DIAGNOSIS — I2510 Atherosclerotic heart disease of native coronary artery without angina pectoris: Secondary | ICD-10-CM | POA: Diagnosis present

## 2015-06-28 DIAGNOSIS — I248 Other forms of acute ischemic heart disease: Secondary | ICD-10-CM | POA: Diagnosis not present

## 2015-06-28 DIAGNOSIS — S065X0D Traumatic subdural hemorrhage without loss of consciousness, subsequent encounter: Secondary | ICD-10-CM | POA: Diagnosis not present

## 2015-06-28 DIAGNOSIS — Z923 Personal history of irradiation: Secondary | ICD-10-CM | POA: Diagnosis not present

## 2015-06-28 DIAGNOSIS — I25810 Atherosclerosis of coronary artery bypass graft(s) without angina pectoris: Secondary | ICD-10-CM | POA: Diagnosis not present

## 2015-06-28 DIAGNOSIS — I619 Nontraumatic intracerebral hemorrhage, unspecified: Secondary | ICD-10-CM | POA: Diagnosis not present

## 2015-06-28 DIAGNOSIS — Z87820 Personal history of traumatic brain injury: Secondary | ICD-10-CM | POA: Diagnosis not present

## 2015-06-28 DIAGNOSIS — E780 Pure hypercholesterolemia: Secondary | ICD-10-CM | POA: Diagnosis present

## 2015-06-28 DIAGNOSIS — I824Z3 Acute embolism and thrombosis of unspecified deep veins of distal lower extremity, bilateral: Secondary | ICD-10-CM | POA: Diagnosis present

## 2015-07-01 DIAGNOSIS — I509 Heart failure, unspecified: Secondary | ICD-10-CM | POA: Diagnosis not present

## 2015-07-01 DIAGNOSIS — Z923 Personal history of irradiation: Secondary | ICD-10-CM | POA: Diagnosis not present

## 2015-07-01 DIAGNOSIS — E780 Pure hypercholesterolemia: Secondary | ICD-10-CM | POA: Diagnosis not present

## 2015-07-01 DIAGNOSIS — E785 Hyperlipidemia, unspecified: Secondary | ICD-10-CM | POA: Diagnosis not present

## 2015-07-01 DIAGNOSIS — D6481 Anemia due to antineoplastic chemotherapy: Secondary | ICD-10-CM | POA: Diagnosis not present

## 2015-07-01 DIAGNOSIS — C3491 Malignant neoplasm of unspecified part of right bronchus or lung: Secondary | ICD-10-CM | POA: Diagnosis not present

## 2015-07-01 DIAGNOSIS — Z7901 Long term (current) use of anticoagulants: Secondary | ICD-10-CM | POA: Diagnosis not present

## 2015-07-01 DIAGNOSIS — Z79899 Other long term (current) drug therapy: Secondary | ICD-10-CM | POA: Diagnosis not present

## 2015-07-01 DIAGNOSIS — Z87891 Personal history of nicotine dependence: Secondary | ICD-10-CM | POA: Diagnosis not present

## 2015-07-01 DIAGNOSIS — Z9889 Other specified postprocedural states: Secondary | ICD-10-CM | POA: Diagnosis not present

## 2015-07-01 DIAGNOSIS — Z9221 Personal history of antineoplastic chemotherapy: Secondary | ICD-10-CM | POA: Diagnosis not present

## 2015-07-01 DIAGNOSIS — C3411 Malignant neoplasm of upper lobe, right bronchus or lung: Secondary | ICD-10-CM | POA: Diagnosis not present

## 2015-07-01 DIAGNOSIS — I2510 Atherosclerotic heart disease of native coronary artery without angina pectoris: Secondary | ICD-10-CM | POA: Diagnosis not present

## 2015-07-01 DIAGNOSIS — I10 Essential (primary) hypertension: Secondary | ICD-10-CM | POA: Diagnosis not present

## 2015-07-01 DIAGNOSIS — Z955 Presence of coronary angioplasty implant and graft: Secondary | ICD-10-CM | POA: Diagnosis not present

## 2015-07-01 DIAGNOSIS — I482 Chronic atrial fibrillation: Secondary | ICD-10-CM | POA: Diagnosis not present

## 2015-07-11 ENCOUNTER — Encounter: Payer: BLUE CROSS/BLUE SHIELD | Admitting: Physician Assistant

## 2015-07-26 ENCOUNTER — Telehealth: Payer: Self-pay | Admitting: *Deleted

## 2015-07-26 MED ORDER — METOPROLOL TARTRATE 50 MG PO TABS: 25.0000 mg | ORAL_TABLET | Freq: Two times a day (BID) | ORAL | Status: DC

## 2015-07-27 ENCOUNTER — Other Ambulatory Visit: Payer: Self-pay | Admitting: Physician Assistant

## 2015-07-27 NOTE — Telephone Encounter (Signed)
Please advise on refill. Thanks, MI 

## 2015-07-28 DIAGNOSIS — Z951 Presence of aortocoronary bypass graft: Secondary | ICD-10-CM | POA: Diagnosis not present

## 2015-07-28 DIAGNOSIS — Z79899 Other long term (current) drug therapy: Secondary | ICD-10-CM | POA: Diagnosis not present

## 2015-07-28 DIAGNOSIS — I2510 Atherosclerotic heart disease of native coronary artery without angina pectoris: Secondary | ICD-10-CM | POA: Diagnosis not present

## 2015-07-28 DIAGNOSIS — C3491 Malignant neoplasm of unspecified part of right bronchus or lung: Secondary | ICD-10-CM | POA: Diagnosis not present

## 2015-07-28 DIAGNOSIS — I482 Chronic atrial fibrillation: Secondary | ICD-10-CM | POA: Diagnosis not present

## 2015-07-28 DIAGNOSIS — Z87891 Personal history of nicotine dependence: Secondary | ICD-10-CM | POA: Diagnosis not present

## 2015-07-28 DIAGNOSIS — I10 Essential (primary) hypertension: Secondary | ICD-10-CM | POA: Diagnosis not present

## 2015-07-28 NOTE — Telephone Encounter (Signed)
ERROR

## 2015-08-26 ENCOUNTER — Encounter: Payer: Self-pay | Admitting: Internal Medicine

## 2015-08-26 ENCOUNTER — Ambulatory Visit (INDEPENDENT_AMBULATORY_CARE_PROVIDER_SITE_OTHER): Payer: Medicare Other | Admitting: Internal Medicine

## 2015-08-26 ENCOUNTER — Ambulatory Visit (HOSPITAL_BASED_OUTPATIENT_CLINIC_OR_DEPARTMENT_OTHER)
Admission: RE | Admit: 2015-08-26 | Discharge: 2015-08-26 | Disposition: A | Discharge status: Home / Self Care | Payer: Medicare Other | Source: Ambulatory Visit | Attending: Internal Medicine | Admitting: Internal Medicine

## 2015-08-26 VITALS — BP 122/56 | HR 60 | Temp 97.9°F | Ht 68.0 in | Wt 219.1 lb

## 2015-08-26 DIAGNOSIS — Z951 Presence of aortocoronary bypass graft: Secondary | ICD-10-CM | POA: Diagnosis not present

## 2015-08-26 DIAGNOSIS — Z23 Encounter for immunization: Secondary | ICD-10-CM | POA: Diagnosis not present

## 2015-08-26 DIAGNOSIS — Z09 Encounter for follow-up examination after completed treatment for conditions other than malignant neoplasm: Secondary | ICD-10-CM

## 2015-08-26 DIAGNOSIS — Z87891 Personal history of nicotine dependence: Secondary | ICD-10-CM | POA: Insufficient documentation

## 2015-08-26 DIAGNOSIS — J984 Other disorders of lung: Secondary | ICD-10-CM | POA: Diagnosis not present

## 2015-08-26 DIAGNOSIS — R5382 Chronic fatigue, unspecified: Secondary | ICD-10-CM | POA: Diagnosis not present

## 2015-08-26 DIAGNOSIS — I4891 Unspecified atrial fibrillation: Secondary | ICD-10-CM

## 2015-08-26 DIAGNOSIS — R05 Cough: Secondary | ICD-10-CM | POA: Insufficient documentation

## 2015-08-26 DIAGNOSIS — J189 Pneumonia, unspecified organism: Secondary | ICD-10-CM

## 2015-08-26 DIAGNOSIS — I2510 Atherosclerotic heart disease of native coronary artery without angina pectoris: Secondary | ICD-10-CM

## 2015-08-26 DIAGNOSIS — R0989 Other specified symptoms and signs involving the circulatory and respiratory systems: Secondary | ICD-10-CM | POA: Diagnosis not present

## 2015-08-26 LAB — COMPREHENSIVE METABOLIC PANEL
ALT: 27 U/L (ref 0–53)
AST: 24 U/L (ref 0–37)
Albumin: 3.4 g/dL — ABNORMAL LOW (ref 3.5–5.2)
Alkaline Phosphatase: 66 U/L (ref 39–117)
BILIRUBIN TOTAL: 0.5 mg/dL (ref 0.2–1.2)
BUN: 14 mg/dL (ref 6–23)
CO2: 24 meq/L (ref 19–32)
Calcium: 9.3 mg/dL (ref 8.4–10.5)
Chloride: 103 mEq/L (ref 96–112)
Creatinine, Ser: 1.33 mg/dL (ref 0.40–1.50)
GFR: 56.47 mL/min — ABNORMAL LOW (ref >60.00)
Glucose, Bld: 193 mg/dL — ABNORMAL HIGH (ref 70–99)
Potassium: 4.1 mEq/L (ref 3.5–5.1)
Sodium: 137 mEq/L (ref 135–145)
Total Protein: 6.9 g/dL (ref 6.0–8.3)

## 2015-08-26 LAB — CBC WITH DIFFERENTIAL/PLATELET
BASOS ABS: 0 10*3/uL (ref 0.0–0.1)
Basophils Relative: 0.1 % (ref 0.0–3.0)
Eosinophils Absolute: 0.1 10*3/uL (ref 0.0–0.7)
Eosinophils Relative: 1 % (ref 0.0–5.0)
HCT: 35.3 % — ABNORMAL LOW (ref 39.0–52.0)
Hemoglobin: 11.8 g/dL — ABNORMAL LOW (ref 13.0–17.0)
LYMPHS PCT: 17.1 % (ref 12.0–46.0)
Lymphs Abs: 1.4 10*3/uL (ref 0.7–4.0)
MCHC: 33.5 g/dL (ref 30.0–36.0)
MCV: 94.4 fl (ref 78.0–100.0)
Monocytes Absolute: 0.7 10*3/uL (ref 0.1–1.0)
Monocytes Relative: 8.5 % (ref 3.0–12.0)
NEUTROS ABS: 6.2 10*3/uL (ref 1.4–7.7)
NEUTROS PCT: 73.3 % (ref 43.0–77.0)
Platelets: 195 10*3/uL (ref 150.0–400.0)
RBC: 3.73 Mil/uL — ABNORMAL LOW (ref 4.22–5.81)
RDW: 15.6 % — AB (ref 11.5–15.5)
WBC: 8.5 10*3/uL (ref 4.0–10.5)

## 2015-08-26 MED ORDER — MEGESTROL ACETATE 400 MG/10ML PO SUSP: 400.0000 mg | Freq: Two times a day (BID) | ORAL | Status: DC

## 2015-08-26 NOTE — Progress Notes (Signed)
Pre visit review using our clinic review tool, if applicable. No additional management support is needed unless otherwise documented below in the visit note. 

## 2015-08-26 NOTE — Patient Instructions (Addendum)
Get your blood work before you leave   Stop by the first floor and get the XR   Drink plenty of fluids  Megestrol  twice a day as needed      Next visit  for a checkup in 4 weeks   (30 minutes) Please schedule an appointment at the front desk No need to come back fasting

## 2015-08-26 NOTE — Progress Notes (Signed)
Subjective:    Patient ID: Alejandro Mates., male    DOB: 29-Nov-1946, 69 y.o.   MRN: [id number]  DOS:  08/26/2015 Type of visit - description : Acute, here with his wife Interval history: Last visit here 05-2015 for hospital follow-up due to pneumonia, UTI and diarrhea since then he is not feeling well, wife reports a complete lack of energy, he sleeps all day, has a poor appetite. Essentially did not bounce back since the hospital admission few months ago.   In addition, he transfer his oncology care to [facility]. Had a CT 06/28/2015 that showed a pulmonary emboli, was admitted to the hospital, had a bilateral DVT, currently on xarelto. I am trying to review the records via " Care Everywhere" but that is a real challenge! Medications reviewed, he is on Lasix but not on  potassium.  CT (06/28/15) CONCLUSION: 1. 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 Given its relationship to the known radiation port, this may represent thrombus secondary to radiation treatment. The finding can also be seen with tumor thrombus.  2. Scattered right lung groundglass opacities, which may also relate to radiation change, although superimposed infectious/inflammatory process not excluded. 3. Increased mediastinal adenopathy, which may be reactive or relate to neoplastic disease. Attention on follow-up. 4. Interval increase in small right pleural effusion. 5. Ancillary findings as above.    Review of Systems Denies fever chills. No lower extremity edema No nausea or vomiting. Occasional diarrhea without blood in the stools. He also has occasional cough with clear sputum. No hemoptysis. Continue with dyspnea on exertion to 20 steps. Wife states he is somewhat depressed, he is on Prozac, patient denies depression.   Past Medical History  Diagnosis Date  . Systolic heart failure   . CAD (coronary artery disease)       s/p CABGx4 on 12/24/2008  . Dyslipidemia   . HTN (hypertension)   . Atrial fibrillation   . Tobacco abuse   . Alcohol abuse   . H/O hiatal hernia   . Myocardial infarction   . Dysrhythmia     atrial fib  . Stroke 5/15  . CHF (congestive heart failure)   . Shortness of breath     occ  . GERD (gastroesophageal reflux disease)   . S/P chemotherapy, time since 4-12 weeks   . Cancer     around nose.  New dx of lung cancer  . Non-small cell carcinoma of lung, stage 3 12/16/2014    Past Surgical History  Procedure Laterality Date  . Umbilical hernia repair  2010  . Hernia repair  2012  . Ventral hernia repair N/A 01/22/2013    Procedure: HERNIA REPAIR VENTRAL ADULT;  Surgeon: [name], MD;  Location: [place];  Service: General;  Laterality: N/A;  . Application of a-cell of chest/abdomen N/A 01/22/2013    Procedure: APPLICATION OF A-CELL OF CHEST/ABDOMEN;  Surgeon: [name], MD;  Location: [place];  Service: General;  Laterality: N/A;  . Cystoscopy N/A 01/22/2013    Procedure: Consuela Mimes;  Surgeon: [name], MD;  Location: [place] [facility];  Service: Urology;  Laterality: N/A;  Cystoscopy with balloon dilation. Insertion of coude catheter.  . Craniotomy Right 04/19/2014    Procedure: CRANIOTOMY HEMATOMA EVACUATION SUBDURAL;  Surgeon: [name], MD;  Location: [place] [facility];  Service: Neurosurgery;  Laterality: Right;  right  . Craniotomy Right 04/23/2014    Procedure: Craniotomy for Intracerebral  Hemorrhage;  Surgeon: [name], MD;  Location: [place] [facility];  Service: Neurosurgery;  Laterality: Right;  . 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 Coronary artery bypass graft  12/24/2008    4 vessel  . Coronary artery bypass graft  2012?  Marland Kitchen Craniotomy N/A 09/13/2014    Procedure: CRANIOTOMY BONE FLAP/PROSTHETIC PLATE;  Surgeon: [name], MD;  Location: [place] [facility];  Service: Neurosurgery;  Laterality: N/A;  . Video bronchoscopy with endobronchial ultrasound N/A 11/09/2014    Procedure: VIDEO BRONCHOSCOPY WITH ENDOBRONCHIAL ULTRASOUND;  Surgeon: Collene Gobble, MD;  Location: [place];  Service: Thoracic;  Laterality: N/A;    Social History   Social History  . Marital Status: Married    Spouse Name: N/A  . Number of Children: 2  . Years of Education: N/A   Occupational History  . [employer] - retired    Social History Main Topics  . Smoking status: Former Smoker -- 1.00 packs/day for 50 years    Types: Cigarettes    Quit date: 04/19/2013  . Smokeless tobacco: Never Used  . Alcohol Use: No     Comment: quit  . Drug Use: No  . Sexual Activity: Not on file   Other Topics Concern  . Not on file   Social History Narrative   Lives in [place], [place] with wife.  Ambulates with a walker.        Medication List       This list is accurate as of: 08/26/15 11:59 PM.  Always use your most recent med list.               albuterol 108 (90 BASE) MCG/ACT inhaler  Commonly known as:  PROVENTIL HFA;VENTOLIN HFA  Inhale 2 puffs into the lungs every 4 (four) hours as needed for wheezing or shortness of breath.     aspirin 81 MG tablet  Take 81 mg by mouth daily.     digoxin 0.125 MG tablet  Commonly known as:  LANOXIN  Take 1 tablet (0.125 mg total) by mouth daily.     FLUoxetine 20 MG capsule  Commonly known as:  PROZAC  Take 1 capsule (20 mg total) by mouth daily.     furosemide 40 MG tablet  Commonly known as:  LASIX  Take 1 tablet (40 mg total) by mouth daily.     levETIRAcetam 500 MG tablet  Commonly known as:  KEPPRA  Take 1 tablet (500 mg total) by mouth 2 (two) times daily.     lidocaine-prilocaine cream  Commonly known as:  EMLA  Apply 1 application topically as needed.     megestrol 400 MG/10ML suspension  Commonly known as:  MEGACE  Take 10 mLs (400 mg total) by  mouth 2 (two) times daily.     metoprolol 50 MG tablet  Commonly known as:  LOPRESSOR  Take 0.5 tablets (25 mg total) by mouth 2 (two) times daily.     multivitamin with minerals Tabs tablet  Take 1 tablet by mouth daily.     oxyCODONE 5 MG immediate release tablet  Commonly known as:  Oxy IR/ROXICODONE  Take 1 tablet (5 mg total) by mouth every 4 (four) hours as needed for moderate pain.     pantoprazole 40 MG tablet  Commonly known as:  PROTONIX  Take 1 tablet (40 mg total) by mouth daily.     Rivaroxaban 15 MG Tabs tablet  Commonly known as:  XARELTO  Take 15 mg by mouth 2 (two) times daily with a meal.     saccharomyces boulardii 250 MG capsule  Commonly known as:  FLORASTOR  Take 1 capsule (250 mg total) by mouth 2 (two) times daily.     tamsulosin 0.4 MG Caps capsule  Commonly known as:  FLOMAX  TAKE ONE CAPSULE BY MOUTH EVERY DAY           Objective:   Physical Exam BP 122/56 mmHg  Pulse 60  Temp(Src) 97.9 F (36.6 C) (Oral)  Ht '5\' 8"'$  (1.727 m)  Wt 219 lb 2 oz (99.394 kg)  BMI 33.33 kg/m2  SpO2 96% General:   Well developed, tired, chronically ill but in no distress HEENT:  Normocephalic . Face symmetric, atraumatic Neck: No JVD at 45 Lungs:  Decreased breath sounds Normal respiratory effort, no intercostal retractions, no accessory muscle use. Heart: RRR,  no murmur.  no pretibial edema bilaterally  Abdomen:  Not distended, soft, non-tender. No rebound or rigidity. No mass,organomegaly Skin: Not pale. Not jaundice Neurologic:  alert but slow to answer questions & oriented X3.  Speech normal, gait appropriate for age and unassisted Psych--  Cognition and judgment appear intact.  Cooperative with normal attention span and concentration.  Behavior appropriate. No anxious or depressed appearing.    Assessment & Plan:   Assessment > HTN Dyslipidemia Cardiovascular: --CAD, history of MI, CABG 2010 --Atrial fibrillation -- no coumadin  candidate since 04-2014 --CHF H/o SDH,  F/u by intraparenchymal bleed and infection >>>>craniotomy 04/19/2014, 04/23/2014, 06/16/2014, 09/13/2014 Non-small cell lung cancer  Dx 12-2014, transfer care to [facility] ~ 06-2015 GERD History of tobacco, EtOH   Plan Profound fatigue:, has not bounced back since admission d/t pneumonia, sepsis 05-2015. On exam, no vol overload, vital signs stable. No recent chemotherapy.Mild depression? Most likely fatigue will be multifactorial Plan: CMP, CBC digoxin level. Chest x-ray. Encourage good nutrition, rx megestrol. RTC 4 weeks Primary care: Flu shot today

## 2015-08-27 DIAGNOSIS — Z09 Encounter for follow-up examination after completed treatment for conditions other than malignant neoplasm: Secondary | ICD-10-CM | POA: Insufficient documentation

## 2015-08-27 LAB — DIGOXIN LEVEL: Digoxin Level: 1 ug/L (ref 0.8–2.0)

## 2015-08-27 NOTE — Assessment & Plan Note (Signed)
Profound fatigue:, has not bounced back since admission d/t pneumonia, sepsis 05-2015. On exam, no vol overload, vital signs stable. No recent chemotherapy.Mild depression? Most likely fatigue will be multifactorial Plan: CMP, CBC digoxin level. Chest x-ray. Encourage good nutrition, rx megestrol. RTC 4 weeks Primary care: Flu shot today

## 2015-08-31 ENCOUNTER — Other Ambulatory Visit: Payer: Self-pay | Admitting: Cardiovascular Disease

## 2015-08-31 NOTE — Telephone Encounter (Signed)
Ok to refill 

## 2015-08-31 NOTE — Telephone Encounter (Signed)
I approve thx

## 2015-09-05 ENCOUNTER — Other Ambulatory Visit: Payer: BLUE CROSS/BLUE SHIELD

## 2015-09-07 ENCOUNTER — Ambulatory Visit: Payer: BLUE CROSS/BLUE SHIELD | Admitting: Internal Medicine

## 2015-09-08 ENCOUNTER — Ambulatory Visit: Payer: BLUE CROSS/BLUE SHIELD | Admitting: Internal Medicine

## 2015-09-08 ENCOUNTER — Telehealth: Payer: Self-pay | Admitting: *Deleted

## 2015-09-08 ENCOUNTER — Telehealth: Payer: Self-pay | Admitting: Medical Oncology

## 2015-09-08 NOTE — Telephone Encounter (Signed)
I left a message for pt to call back about missed appt today.

## 2015-09-08 NOTE — Telephone Encounter (Signed)
Home health certification and plan of care received from [facility] for cert period: [id number]/6886 - 07/02/2015. Forwarded to Dr. Larose Kells. JG//CMA

## 2015-09-09 NOTE — Telephone Encounter (Signed)
Signed forms faxed to [facility] successfully. Sent for scanning. JG//CMA

## 2015-09-22 DIAGNOSIS — Z923 Personal history of irradiation: Secondary | ICD-10-CM | POA: Diagnosis not present

## 2015-09-22 DIAGNOSIS — Z7901 Long term (current) use of anticoagulants: Secondary | ICD-10-CM | POA: Diagnosis not present

## 2015-09-22 DIAGNOSIS — R5383 Other fatigue: Secondary | ICD-10-CM | POA: Diagnosis not present

## 2015-09-22 DIAGNOSIS — Z86718 Personal history of other venous thrombosis and embolism: Secondary | ICD-10-CM | POA: Diagnosis not present

## 2015-09-22 DIAGNOSIS — I2510 Atherosclerotic heart disease of native coronary artery without angina pectoris: Secondary | ICD-10-CM | POA: Diagnosis not present

## 2015-09-22 DIAGNOSIS — I10 Essential (primary) hypertension: Secondary | ICD-10-CM | POA: Diagnosis not present

## 2015-09-22 DIAGNOSIS — Z86711 Personal history of pulmonary embolism: Secondary | ICD-10-CM | POA: Diagnosis not present

## 2015-09-22 DIAGNOSIS — C3481 Malignant neoplasm of overlapping sites of right bronchus and lung: Secondary | ICD-10-CM | POA: Diagnosis not present

## 2015-09-22 DIAGNOSIS — Z9221 Personal history of antineoplastic chemotherapy: Secondary | ICD-10-CM | POA: Diagnosis not present

## 2015-09-22 DIAGNOSIS — I4891 Unspecified atrial fibrillation: Secondary | ICD-10-CM | POA: Diagnosis not present

## 2015-09-22 DIAGNOSIS — R918 Other nonspecific abnormal finding of lung field: Secondary | ICD-10-CM | POA: Diagnosis not present

## 2015-09-22 DIAGNOSIS — E785 Hyperlipidemia, unspecified: Secondary | ICD-10-CM | POA: Diagnosis not present

## 2015-09-22 DIAGNOSIS — C3411 Malignant neoplasm of upper lobe, right bronchus or lung: Secondary | ICD-10-CM | POA: Diagnosis not present

## 2015-09-23 ENCOUNTER — Ambulatory Visit: Payer: Medicare Other | Admitting: Internal Medicine

## 2015-10-04 ENCOUNTER — Other Ambulatory Visit: Payer: Self-pay | Admitting: Cardiovascular Disease

## 2015-10-04 ENCOUNTER — Other Ambulatory Visit: Payer: Self-pay | Admitting: Internal Medicine

## 2015-10-05 ENCOUNTER — Telehealth: Payer: Self-pay | Admitting: *Deleted

## 2015-10-05 NOTE — Telephone Encounter (Signed)
S/w pt's wife DPR on file; Wife made appt for f/u w/PA same day Dr. [name] is in the office 11/28 @ 11:50.Marland Kitchen

## 2015-10-07 DIAGNOSIS — I2510 Atherosclerotic heart disease of native coronary artery without angina pectoris: Secondary | ICD-10-CM | POA: Diagnosis not present

## 2015-10-07 DIAGNOSIS — I482 Chronic atrial fibrillation: Secondary | ICD-10-CM | POA: Diagnosis not present

## 2015-10-07 DIAGNOSIS — C3491 Malignant neoplasm of unspecified part of right bronchus or lung: Secondary | ICD-10-CM | POA: Diagnosis not present

## 2015-10-07 DIAGNOSIS — R918 Other nonspecific abnormal finding of lung field: Secondary | ICD-10-CM | POA: Diagnosis not present

## 2015-10-19 DIAGNOSIS — R918 Other nonspecific abnormal finding of lung field: Secondary | ICD-10-CM | POA: Diagnosis not present

## 2015-10-19 DIAGNOSIS — C3490 Malignant neoplasm of unspecified part of unspecified bronchus or lung: Secondary | ICD-10-CM | POA: Diagnosis not present

## 2015-10-30 NOTE — Progress Notes (Signed)
Cardiology Office Note   Date:  10/30/2015   ID:  Alejandro Ferrall., DOB 09-04-1946, MRN [id number]  PCP:  [name], MD  Cardiologist:  Dr. [name]     Chief Complaint  Patient presents with  . Follow-up  . Congestive Heart Failure  . Atrial Fibrillation     History of Present Illness: Alejandro Kmetz. is a 69 y.o. male with a hx of CAD, status post CABG in 12/2008 (LIMA-LAD, SVG-diagonal, SVG-OM, SVG-PDA) ischemic/nonischemic cardiomyopathy, systolic CHF, HTN, HL, permanent atrial fibrillation. EF has been severely depressed in the past. MRI in 09/2013 demonstrated improved LV function with an EF of 52%  Admitted 04/2014 after falling and hitting his head resulting in a subdural hematoma which required craniotomy and hematoma evacuation. He ultimately underwent reexploration of right craniotomy for partial temporal lobectomy and evacuation of right temporal hematoma with craniectomy. He was taken off anticoagulation and is not felt to be a candidate for future anticoagulation.   He has been diagnosed with stage IIIa non-small cell lung CA status post course of concurrent chemoradiation with carboplatin and paclitaxel.  PCP recently contacted Dr. [name] secondary to hypotension related to cancer treatment. Carvedilol dose was reduced and lisinopril was placed on hold.  I recently saw him on 03/29/15 with complaints of increased dyspnea with exertion. He did have signs of volume excess. Recent surveillance CT demonstrated bilateral pleural effusions. His pulmonologist also adjusted his pulmonary regimen. His heart rate was uncontrolled. I increased his beta blocker. His volume appeared stable. However, his BNP remained elevated. I adjusted his Lasix further. I set him up for a nuclear stress test. This was felt to be high risk with large severe fixed defect involving the entire inferior wall and apex consistent with infarct but no ischemia, EF 24%. Therefore, echocardiogram was  obtained and demonstrated an EF of 30-35% with mild MR and biatrial enlargement. He returns for follow-up.  Since last seen, he has had no improvement in his dyspnea. He remains NYHA 3. He denies orthopnea or PND. LE edema is stable. He denies any weight changes at home. He denies chest discomfort or syncope. He denies palpitations but has had one episode of dizziness. He saw Dr. [name] recently who felt that he did not have much of an improvement with Spiriva. Spiriva was discontinued.   Studies/Reports Reviewed Today:  Nuclear Study 04/05/15 High risk study with large sized, severe intensity, fixed defect involving the entire inferior wall and apex consistent with prior infarct. No ischemic noted. Severe LV dysfunction with EF 24%.  Echo 04/19/15 - EF 30% to 35%.  - Aortic valve: There was trivial regurgitation. - Mitral valve: There was mild regurgitation. - Left atrium: The atrium was severely dilated. - Right atrium: The atrium was moderately dilated.  LHC (12/2008): Left main 95% LAD 90% circumflex marginal 70% EF 25-30%. => CABG  Echo (02/24/13):  Mild LVH, EF 30-35%, mild LAE, mild RVE, mildly reduced RVSF  Cardiac MRI (09/08/13):  EF 52%, no hyper-enhancement or infarct in LV myocardium, mild to moderate LAE, mild MR  Past Medical History  Diagnosis Date  . Systolic heart failure ([place])   . 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 Dyslipidemia   . HTN (hypertension)   . Atrial fibrillation ([place])   . Tobacco abuse   . Alcohol abuse   . H/O hiatal hernia   . Myocardial infarction ([place])   . Dysrhythmia  atrial fib  . Stroke ([place]) 5/15  . CHF (congestive heart failure) ([place])   . Shortness of breath     occ  . GERD (gastroesophageal reflux disease)   . S/P chemotherapy, time since 4-12 weeks   . Cancer ([place])     around nose.  New dx of lung cancer  . Non-small cell carcinoma of lung, stage 3 12/16/2014    Past Surgical History  Procedure  Laterality Date  . Umbilical hernia repair  2010  . Hernia repair  2012  . Ventral hernia repair N/A 01/22/2013    Procedure: HERNIA REPAIR VENTRAL ADULT;  Surgeon: [name], MD;  Location: [facility];  Service: General;  Laterality: N/A;  . Application of a-cell of chest/abdomen N/A 01/22/2013    Procedure: APPLICATION OF A-CELL OF CHEST/ABDOMEN;  Surgeon: [name], MD;  Location: [facility];  Service: General;  Laterality: N/A;  . Cystoscopy N/A 01/22/2013    Procedure: Consuela Mimes;  Surgeon: [name], MD;  Location: [facility];  Service: Urology;  Laterality: N/A;  Cystoscopy with balloon dilation. Insertion of coude catheter.  . Craniotomy Right 04/19/2014    Procedure: CRANIOTOMY HEMATOMA EVACUATION SUBDURAL;  Surgeon: [name], MD;  Location: [facility];  Service: Neurosurgery;  Laterality: Right;  right  . Craniotomy Right 04/23/2014    Procedure: Craniotomy for Intracerebral Hemorrhage;  Surgeon: [name], MD;  Location: [facility];  Service: Neurosurgery;  Laterality: Right;  . 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 Coronary artery bypass graft  12/24/2008    4 vessel  . Coronary artery bypass graft  2012?  Marland Kitchen Craniotomy N/A 09/13/2014    Procedure: CRANIOTOMY BONE FLAP/PROSTHETIC PLATE;  Surgeon: [name], MD;  Location: [facility];  Service: Neurosurgery;  Laterality: N/A;  . Video bronchoscopy with endobronchial ultrasound N/A 11/09/2014    Procedure: VIDEO BRONCHOSCOPY WITH ENDOBRONCHIAL ULTRASOUND;  Surgeon: Collene Gobble, MD;  [facility];  Service: Thoracic;  Laterality: N/A;     Current Outpatient Prescriptions  Medication Sig Dispense Refill  . albuterol (PROVENTIL HFA;VENTOLIN HFA) 108 (90 BASE) MCG/ACT inhaler Inhale 2 puffs into the lungs every 4 (four) hours as needed for wheezing or shortness of  breath. 1 Inhaler 6  . aspirin 81 MG tablet Take 81 mg by mouth daily.    . digoxin (LANOXIN) 0.125 MG tablet Take 1 tablet (0.125 mg total) by mouth daily. 30 tablet 6  . FLUoxetine (PROZAC) 20 MG capsule Take 1 capsule (20 mg total) by mouth daily. 30 capsule 6  . furosemide (LASIX) 40 MG tablet Take 1 tablet (40 mg total) by mouth daily. 45 tablet 6  . levETIRAcetam (KEPPRA) 500 MG tablet Take 1 tablet (500 mg total) by mouth 2 (two) times daily. 60 tablet 6  . lidocaine-prilocaine (EMLA) cream Apply 1 application topically as needed. (Patient not taking: Reported on 08/26/2015) 30 g 2  . megestrol (MEGACE) 400 MG/10ML suspension Take 10 mLs (400 mg total) by mouth 2 (two) times daily. 480 mL 0  . metoprolol (LOPRESSOR) 50 MG tablet Take 0.5 tablets (25 mg total) by mouth 2 (two) times daily. 30 tablet 2  . Multiple Vitamin (MULTIVITAMIN WITH MINERALS) TABS tablet Take 1 tablet by mouth daily.    Marland Kitchen oxyCODONE (OXY IR/ROXICODONE) 5 MG immediate  release tablet Take 1 tablet (5 mg total) by mouth every 4 (four) hours as needed for moderate pain. (Patient not taking: Reported on 08/26/2015) 20 tablet 0  . pantoprazole (PROTONIX) 40 MG tablet Take 1 tablet (40 mg total) by mouth daily. 30 tablet 0  . Rivaroxaban (XARELTO) 15 MG TABS tablet Take 15 mg by mouth 2 (two) times daily with a meal.    . saccharomyces boulardii (FLORASTOR) 250 MG capsule Take 1 capsule (250 mg total) by mouth 2 (two) times daily. (Patient not taking: Reported on 08/26/2015) 30 capsule 0  . tamsulosin (FLOMAX) 0.4 MG CAPS capsule TAKE ONE CAPSULE BY MOUTH ONCE DAILY 30 capsule 6   No current facility-administered medications for this visit.    Allergies:   Review of patient's allergies indicates no known allergies.    Social History:  The patient  reports that he quit smoking about 2 years ago. His smoking use included Cigarettes. He has a 50 pack-year smoking history. He has never used smokeless tobacco. He reports that he  does not drink alcohol or use illicit drugs.   Family History:  The patient's family history includes Diabetes in his father and mother; Heart attack in his brother, father, and mother; Heart disease in his brother, father, and mother; Rheum arthritis in his daughter. There is no history of Colon cancer, Prostate cancer, or Stroke.    ROS:   Please see the history of present illness.   Review of Systems  All other systems reviewed and are negative.    PHYSICAL EXAM: VS:  There were no vitals taken for this visit.    Wt Readings from Last 3 Encounters:  08/26/15 219 lb 2 oz (99.394 kg)  06/07/15 231 lb 11.2 oz (105.098 kg)  05/31/15 232 lb 4 oz (105.348 kg)     GEN: Well nourished, well developed, in no acute distress HEENT: normal Neck: I cannot appreciate JVD,  no masses Cardiac:  Normal S1/S2, irregularly irregular rhythm; no murmur ,  no rubs or gallops, trace bilateral LE  edema  Respiratory:  Decreased breath sounds bilaterally, no wheezing, rhonchi or rales. GI: soft, nontender, nondistended  MS: no deformity or atrophy Skin: warm and dry  Neuro:  CNs II-XII intact, Strength and sensation are intact Psych: Normal affect   EKG:  EKG is ordered today.  It demonstrates:   AFib HR 112, lat TWI, no change from prior tracing.    Recent Labs: 04/05/2015: Pro B Natriuretic peptide (BNP) 252.0* 04/28/2015: TSH 1.612 05/23/2015: B Natriuretic Peptide 141.1* 05/27/2015: Magnesium 1.9 08/26/2015: ALT 27; BUN 14; Creatinine, Ser 1.33; Hemoglobin 11.8*; Platelets 195.0; Potassium 4.1; Sodium 137    Lipid Panel    Component Value Date/Time   CHOL 144 05/24/2015 0630   TRIG 221* 05/24/2015 0630   HDL 16* 05/24/2015 0630   CHOLHDL 9.0 05/24/2015 0630   VLDL 44* 05/24/2015 0630   LDLCALC 84 05/24/2015 0630      ASSESSMENT AND PLAN:  SOB (shortness of breath):  I continue to feel that his dyspnea is multifactorial. Question if rapid ventricular rate has contributed to his  worsening LV function and increased dyspnea. I reviewed his stress test and echocardiogram today with Dr. [name]. At this point, we do not feel that the patient needs to undergo cardiac catheterization. He has limited therapeutic options given his need to avoid anticoagulation.  Chronic Systolic CHF:  Overall, volume appears to be stable.  Cardiomyopathy - Mixed Ischemic/Non-Ischemic (EF 52% by MRI 09/2013):  EF improved to 52% by MRI in 2014. ACE inhibitor was recently held secondary to hypotension. Recent echocardiogram with EF 30-35%. As outlined above, question if uncontrolled heart rate has contributed to reduced LV function. We will try to adjust his rate controlling therapy better and eventually add back an ACE inhibitor if his blood pressure will allow. Consider repeating his echocardiogram once his heart rate is better controlled.  Coronary artery disease:  Continue aspirin, beta blocker, statin. He denies chest pain. As noted, I reviewed with Dr. [name]. We do not feel that the patient needs to undergo cardiac catheterization at this time.  Chronic atrial fibrillation:  Heart rate is uncontrolled. He has had difficulty with increasing doses of Coreg due to hypotension. I considered placing him on digoxin. However, for now I will stop his carvedilol. I will place him on metoprolol tartrate 50 mg twice a day. He will be brought back in one week to see the nurse for blood pressure check and ECG. If his heart rate remains uncontrolled and his blood pressure is too soft to further titrate metoprolol, I will place him on digoxin 0.125 mg daily at that time.  Essential hypertension:  Controlled  Hyperlipidemia:  Continue statin.  Subdural hematoma:  He is not a candidate for oral anticoagulation.  Non-small cell carcinoma of lung, stage 3:  Continue follow-up with oncology.    Medication Adjustments: Current medicines are reviewed at length with the patient today.  Concerns regarding  medicines are as outlined above.  The following changes have been made:   Modified Medications   No medications on file   Discontinued Medications   No medications on file   New Prescriptions   No medications on file   Labs/ tests ordered today include:  No orders of the defined types were placed in this encounter.     Disposition:    FU me or Dr. [name] in 4-6 weeks.    Signed, Versie Starks, MHS 10/30/2015 8:17 PM    [facility] [place], [place], [place]  [address] Phone: [phone]; Fax: [phone]    This encounter was created in error - please disregard.

## 2015-10-31 ENCOUNTER — Ambulatory Visit: Payer: Medicare Other | Admitting: Internal Medicine

## 2015-10-31 ENCOUNTER — Encounter: Payer: Medicare Other | Admitting: Physician Assistant

## 2015-10-31 DIAGNOSIS — R0989 Other specified symptoms and signs involving the circulatory and respiratory systems: Secondary | ICD-10-CM

## 2015-11-02 ENCOUNTER — Encounter: Payer: Self-pay | Admitting: Physician Assistant

## 2015-11-03 DIAGNOSIS — I482 Chronic atrial fibrillation: Secondary | ICD-10-CM | POA: Diagnosis not present

## 2015-11-04 ENCOUNTER — Other Ambulatory Visit: Payer: Self-pay | Admitting: Physician Assistant

## 2015-11-09 ENCOUNTER — Other Ambulatory Visit: Payer: Self-pay | Admitting: Internal Medicine

## 2015-11-11 ENCOUNTER — Telehealth: Payer: Self-pay

## 2015-11-11 NOTE — Telephone Encounter (Signed)
Dr. Florentina Addison at [facility] is assuming  patient's cardiology care, he had some questions particularly about aspirin, I confirmed the patient was supposed to be on aspirin daily.

## 2015-11-11 NOTE — Telephone Encounter (Signed)
Last 3 OV notes faxed to Dr. Quentin Cornwall at [phone].

## 2015-11-11 NOTE — Telephone Encounter (Signed)
-----   Message from Colon Branch, MD sent at 11/11/2015  1:47 PM EST ----- Regarding: Fax Fax My last 3 office visit notes on the last oncology note to Dr. Quentin Cornwall [phone]

## 2015-11-18 DIAGNOSIS — Z452 Encounter for adjustment and management of vascular access device: Secondary | ICD-10-CM | POA: Diagnosis not present

## 2015-12-02 DIAGNOSIS — J189 Pneumonia, unspecified organism: Secondary | ICD-10-CM | POA: Diagnosis not present

## 2015-12-08 ENCOUNTER — Other Ambulatory Visit: Payer: Self-pay | Admitting: Cardiovascular Disease

## 2015-12-09 NOTE — Telephone Encounter (Signed)
Hi Alejandro [name],  Looks like pt's PCP had decreased pt to 25 mg BID back 05/2015. I hope this helps.

## 2015-12-09 NOTE — Telephone Encounter (Signed)
AVS Reports     Date/Time Report Action User    04/22/2015 9:55 AM After Visit Summary Printed Michae Kava, CMA      Patient Instructions     Medication Instructions:  1. STOP COREG 2. START METOPROLOL TARTRATE 50 MG 1 TAB TWICE DAILY; RX SENT IN TODAY  Labwork: NONE  Testing/Procedures: NONE  Follow-Up: 1. NURSE VISIT FOR BP CHECK AND EKG TO BE DONE IN 2 WEEKS; COREG D/C 5/20; NEW START METOPROLOL TART 50 MG BID  2. 4-6 WEEK WITH SCOTT WEAVER, PAC SAME DAY DR. [name] IS IN THE OFFICE

## 2015-12-09 NOTE — Telephone Encounter (Signed)
Hi Brandy,  Just me again!! So I s/w pt's wife just now to verify dose of Metoprolol. Wife verified that Dr. Larose Kells did decrease in 05/2015 Metoprolol to 25 mg BID. Wife also stated though that pt is now seeing a Film/video editor at [facility]. I have then advised for either Dr. Larose Kells or new Cardiologist then probably should f/u on Metoprolol. Wife is agreeable to this. She said when she called the pharmacy today it was to tell them that pt has a new cardiologist.

## 2015-12-19 ENCOUNTER — Other Ambulatory Visit: Payer: Self-pay | Admitting: Internal Medicine

## 2016-01-05 DIAGNOSIS — R590 Localized enlarged lymph nodes: Secondary | ICD-10-CM | POA: Diagnosis not present

## 2016-01-05 DIAGNOSIS — I4891 Unspecified atrial fibrillation: Secondary | ICD-10-CM | POA: Diagnosis not present

## 2016-01-05 DIAGNOSIS — Z7901 Long term (current) use of anticoagulants: Secondary | ICD-10-CM | POA: Diagnosis not present

## 2016-01-05 DIAGNOSIS — C3411 Malignant neoplasm of upper lobe, right bronchus or lung: Secondary | ICD-10-CM | POA: Diagnosis not present

## 2016-01-05 DIAGNOSIS — Z86718 Personal history of other venous thrombosis and embolism: Secondary | ICD-10-CM | POA: Diagnosis not present

## 2016-01-05 DIAGNOSIS — I2510 Atherosclerotic heart disease of native coronary artery without angina pectoris: Secondary | ICD-10-CM | POA: Diagnosis not present

## 2016-01-05 DIAGNOSIS — I10 Essential (primary) hypertension: Secondary | ICD-10-CM | POA: Diagnosis not present

## 2016-01-05 DIAGNOSIS — R918 Other nonspecific abnormal finding of lung field: Secondary | ICD-10-CM | POA: Diagnosis not present

## 2016-01-05 DIAGNOSIS — Z86711 Personal history of pulmonary embolism: Secondary | ICD-10-CM | POA: Diagnosis not present

## 2016-01-05 DIAGNOSIS — Z951 Presence of aortocoronary bypass graft: Secondary | ICD-10-CM | POA: Diagnosis not present

## 2016-01-05 DIAGNOSIS — E785 Hyperlipidemia, unspecified: Secondary | ICD-10-CM | POA: Diagnosis not present

## 2016-01-05 DIAGNOSIS — C3491 Malignant neoplasm of unspecified part of right bronchus or lung: Secondary | ICD-10-CM | POA: Diagnosis not present

## 2016-01-05 DIAGNOSIS — D6481 Anemia due to antineoplastic chemotherapy: Secondary | ICD-10-CM | POA: Diagnosis not present

## 2016-01-05 DIAGNOSIS — R5383 Other fatigue: Secondary | ICD-10-CM | POA: Diagnosis not present

## 2016-01-05 DIAGNOSIS — Z923 Personal history of irradiation: Secondary | ICD-10-CM | POA: Diagnosis not present

## 2016-01-08 ENCOUNTER — Other Ambulatory Visit: Payer: Self-pay | Admitting: Internal Medicine

## 2016-01-13 DIAGNOSIS — Z7901 Long term (current) use of anticoagulants: Secondary | ICD-10-CM | POA: Diagnosis not present

## 2016-01-13 DIAGNOSIS — C3490 Malignant neoplasm of unspecified part of unspecified bronchus or lung: Secondary | ICD-10-CM | POA: Diagnosis not present

## 2016-01-13 DIAGNOSIS — R918 Other nonspecific abnormal finding of lung field: Secondary | ICD-10-CM | POA: Diagnosis not present

## 2016-01-13 DIAGNOSIS — C3411 Malignant neoplasm of upper lobe, right bronchus or lung: Secondary | ICD-10-CM | POA: Diagnosis not present

## 2016-01-13 DIAGNOSIS — I482 Chronic atrial fibrillation: Secondary | ICD-10-CM | POA: Diagnosis not present

## 2016-01-13 DIAGNOSIS — I2510 Atherosclerotic heart disease of native coronary artery without angina pectoris: Secondary | ICD-10-CM | POA: Diagnosis not present

## 2016-01-24 ENCOUNTER — Telehealth: Payer: Self-pay | Admitting: Internal Medicine

## 2016-01-24 ENCOUNTER — Ambulatory Visit: Payer: Medicare Other | Admitting: Internal Medicine

## 2016-01-24 NOTE — Telephone Encounter (Signed)
Plse see below. Patient should not be charged

## 2016-01-24 NOTE — Telephone Encounter (Signed)
Okay to cancel if that is what Pt wants.

## 2016-01-24 NOTE — Telephone Encounter (Signed)
Caller name: Pamala Hurry Relationship to patient: Wife Can be reached:[phone]   Reason for call: Wife called stating that patient does not want to keep the appointment that he has today. She states that he is doing fine.

## 2016-02-02 DIAGNOSIS — I2510 Atherosclerotic heart disease of native coronary artery without angina pectoris: Secondary | ICD-10-CM | POA: Diagnosis not present

## 2016-02-02 DIAGNOSIS — Z951 Presence of aortocoronary bypass graft: Secondary | ICD-10-CM | POA: Diagnosis not present

## 2016-02-02 DIAGNOSIS — Z8673 Personal history of transient ischemic attack (TIA), and cerebral infarction without residual deficits: Secondary | ICD-10-CM | POA: Diagnosis not present

## 2016-02-02 DIAGNOSIS — I110 Hypertensive heart disease with heart failure: Secondary | ICD-10-CM | POA: Diagnosis not present

## 2016-02-02 DIAGNOSIS — I259 Chronic ischemic heart disease, unspecified: Secondary | ICD-10-CM | POA: Diagnosis not present

## 2016-02-02 DIAGNOSIS — I4891 Unspecified atrial fibrillation: Secondary | ICD-10-CM | POA: Diagnosis not present

## 2016-02-02 DIAGNOSIS — E785 Hyperlipidemia, unspecified: Secondary | ICD-10-CM | POA: Diagnosis not present

## 2016-02-16 DIAGNOSIS — Z9221 Personal history of antineoplastic chemotherapy: Secondary | ICD-10-CM | POA: Diagnosis not present

## 2016-02-16 DIAGNOSIS — R490 Dysphonia: Secondary | ICD-10-CM | POA: Diagnosis not present

## 2016-02-16 DIAGNOSIS — I2510 Atherosclerotic heart disease of native coronary artery without angina pectoris: Secondary | ICD-10-CM | POA: Diagnosis not present

## 2016-02-16 DIAGNOSIS — C3411 Malignant neoplasm of upper lobe, right bronchus or lung: Secondary | ICD-10-CM | POA: Diagnosis not present

## 2016-02-16 DIAGNOSIS — Z79899 Other long term (current) drug therapy: Secondary | ICD-10-CM | POA: Diagnosis not present

## 2016-02-16 DIAGNOSIS — C3491 Malignant neoplasm of unspecified part of right bronchus or lung: Secondary | ICD-10-CM | POA: Diagnosis not present

## 2016-02-16 DIAGNOSIS — I10 Essential (primary) hypertension: Secondary | ICD-10-CM | POA: Diagnosis not present

## 2016-02-16 DIAGNOSIS — D6481 Anemia due to antineoplastic chemotherapy: Secondary | ICD-10-CM | POA: Diagnosis not present

## 2016-02-16 DIAGNOSIS — Z951 Presence of aortocoronary bypass graft: Secondary | ICD-10-CM | POA: Diagnosis not present

## 2016-02-16 DIAGNOSIS — Z7901 Long term (current) use of anticoagulants: Secondary | ICD-10-CM | POA: Diagnosis not present

## 2016-02-16 DIAGNOSIS — E785 Hyperlipidemia, unspecified: Secondary | ICD-10-CM | POA: Diagnosis not present

## 2016-02-16 DIAGNOSIS — N289 Disorder of kidney and ureter, unspecified: Secondary | ICD-10-CM | POA: Diagnosis not present

## 2016-03-08 ENCOUNTER — Encounter: Payer: Self-pay | Admitting: Internal Medicine

## 2016-03-08 ENCOUNTER — Ambulatory Visit (INDEPENDENT_AMBULATORY_CARE_PROVIDER_SITE_OTHER): Payer: Medicare Other | Admitting: Internal Medicine

## 2016-03-08 VITALS — BP 126/74 | HR 101 | Temp 97.7°F | Ht 68.0 in | Wt 234.5 lb

## 2016-03-08 DIAGNOSIS — I2510 Atherosclerotic heart disease of native coronary artery without angina pectoris: Secondary | ICD-10-CM | POA: Diagnosis not present

## 2016-03-08 DIAGNOSIS — R5382 Chronic fatigue, unspecified: Secondary | ICD-10-CM | POA: Diagnosis not present

## 2016-03-08 DIAGNOSIS — I6200 Nontraumatic subdural hemorrhage, unspecified: Secondary | ICD-10-CM

## 2016-03-08 DIAGNOSIS — S065XAA Traumatic subdural hemorrhage with loss of consciousness status unknown, initial encounter: Secondary | ICD-10-CM

## 2016-03-08 DIAGNOSIS — I482 Chronic atrial fibrillation, unspecified: Secondary | ICD-10-CM

## 2016-03-08 DIAGNOSIS — S065X9A Traumatic subdural hemorrhage with loss of consciousness of unspecified duration, initial encounter: Secondary | ICD-10-CM

## 2016-03-08 MED ORDER — DIGOXIN 125 MCG PO TABS: 0.1250 mg | ORAL_TABLET | Freq: Every day | ORAL | Status: DC

## 2016-03-08 NOTE — Progress Notes (Signed)
Pre visit review using our clinic review tool, if applicable. No additional management support is needed unless otherwise documented below in the visit note. 

## 2016-03-08 NOTE — Patient Instructions (Signed)
Next visit in 4 months

## 2016-03-08 NOTE — Progress Notes (Signed)
Subjective:    Patient ID: Alejandro Mates., male    DOB: 04/14/1946, 70 y.o.   MRN: [id number]  DOS:  03/08/2016 Type of visit - description :  Interval history: --Note from  Pulmonary 01-13-2016: CT showed pulmonary emboli, could be chronic. had Legionella pneumonia resolved. --Note from cardiology 02-02-2016: Noted to be stable, follow-up 12 months --Note  from oncology 02-16-2016: Was noted to have fatigue, they believe likely due to long disease and comorbidities.  Labs from last month: White count 6.6, hemoglobin 11, platelets 169. Creatinine 1.25, calcium 8.8, LFTs normal  Review of Systems  In general feeling well. Mobility has increased, not using a walker but a cane. Still has chronic cough, no difficulty breathing or lower extremity edema Emotionally doing well, self discontinue Prozac. Out off digoxin, needs a prescription.    Past Medical History  Diagnosis Date  . Systolic heart failure ([place])   . 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 Dyslipidemia   . HTN (hypertension)   . Atrial fibrillation ([place])   . Tobacco abuse   . Alcohol abuse   . H/O hiatal hernia   . Myocardial infarction ([place])   . Dysrhythmia     atrial fib  . Stroke ([place]) 5/15  . CHF (congestive heart failure) ([place])   . Shortness of breath     occ  . GERD (gastroesophageal reflux disease)   . S/P chemotherapy, time since 4-12 weeks   . Cancer ([place])     around nose.  New dx of lung cancer  . Non-small cell carcinoma of lung, stage 3 12/16/2014    Past Surgical History  Procedure Laterality Date  . Umbilical hernia repair  2010  . Hernia repair  2012  . Ventral hernia repair N/A 01/22/2013    Procedure: HERNIA REPAIR VENTRAL ADULT;  Surgeon: [name], MD;  Location: [facility];  Service: General;  Laterality: N/A;  . Application of a-cell of chest/abdomen N/A 01/22/2013    Procedure: APPLICATION OF A-CELL OF CHEST/ABDOMEN;  Surgeon: [name], MD;  Location: [facility];   Service: General;  Laterality: N/A;  . Cystoscopy N/A 01/22/2013    Procedure: Consuela Mimes;  Surgeon: [name], MD;  Location: [facility];  Service: Urology;  Laterality: N/A;  Cystoscopy with balloon dilation. Insertion of coude catheter.  . Craniotomy Right 04/19/2014    Procedure: CRANIOTOMY HEMATOMA EVACUATION SUBDURAL;  Surgeon: [name], MD;  Location: [place] [place] [facility];  Service: Neurosurgery;  Laterality: Right;  right  . Craniotomy Right 04/23/2014    Procedure: Craniotomy for Intracerebral Hemorrhage;  Surgeon: [name], MD;  Location: [facility];  Service: Neurosurgery;  Laterality: Right;  . 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 Coronary artery bypass graft  12/24/2008    4 vessel  . Coronary artery bypass graft  2012?  Marland Kitchen Craniotomy N/A 09/13/2014    Procedure: CRANIOTOMY BONE FLAP/PROSTHETIC PLATE;  Surgeon: [name], MD;  Location: [facility];  Service: Neurosurgery;  Laterality: N/A;  . Video bronchoscopy with endobronchial ultrasound N/A 11/09/2014    Procedure: VIDEO BRONCHOSCOPY WITH ENDOBRONCHIAL ULTRASOUND;  Surgeon: Collene Gobble, MD;  Location: [facility];  Service: Thoracic;  Laterality: N/A;    Social History   Social History  .  Marital Status: Married    Spouse Name: N/A  . Number of Children: 2  . Years of Education: N/A   Occupational History  . [employer] - retired    Social History Main Topics  . Smoking status: Former Smoker -- 1.00 packs/day for 50 years    Types: Cigarettes    Quit date: 04/19/2013  . Smokeless tobacco: Never Used  . Alcohol Use: No     Comment: quit  . Drug Use: No  . Sexual Activity: Not on file   Other Topics Concern  . Not on file   Social History Narrative   Lives in [place], [place] with wife.  Ambulates with a cane        Medication List         This list is accurate as of: 03/08/16  3:28 PM.  Always use your most recent med list.               albuterol 108 (90 Base) MCG/ACT inhaler  Commonly known as:  PROVENTIL HFA;VENTOLIN HFA  Inhale 2 puffs into the lungs every 4 (four) hours as needed for wheezing or shortness of breath.     aspirin 81 MG tablet  Take 81 mg by mouth daily.     digoxin 0.125 MG tablet  Commonly known as:  LANOXIN  Take 1 tablet (0.125 mg total) by mouth daily.     ELIQUIS 5 MG Tabs tablet  Generic drug:  apixaban  Take 1 tablet by mouth 2 (two) times daily.     furosemide 40 MG tablet  Commonly known as:  LASIX  Take 1 tablet (40 mg total) by mouth daily.     levETIRAcetam 500 MG tablet  Commonly known as:  KEPPRA  Take 1 tablet (500 mg total) by mouth 2 (two) times daily.     lidocaine-prilocaine cream  Commonly known as:  EMLA  Apply 1 application topically as needed.     metoprolol 50 MG tablet  Commonly known as:  LOPRESSOR  Take 50 mg by mouth 2 (two) times daily.     multivitamin with minerals Tabs tablet  Take 1 tablet by mouth daily.     oxyCODONE 5 MG immediate release tablet  Commonly known as:  Oxy IR/ROXICODONE  Take 1 tablet (5 mg total) by mouth every 4 (four) hours as needed for moderate pain.     pantoprazole 40 MG tablet  Commonly known as:  PROTONIX  Take 1 tablet (40 mg total) by mouth daily.     tamsulosin 0.4 MG Caps capsule  Commonly known as:  FLOMAX  TAKE ONE CAPSULE BY MOUTH ONCE DAILY           Objective:   Physical Exam BP 126/74 mmHg  Pulse 101  Temp(Src) 97.7 F (36.5 C) (Oral)  Ht '5\' 8"'$  (1.727 m)  Wt 234 lb 8 oz (106.369 kg)  BMI 35.66 kg/m2  SpO2 95%  General:   Well developed, well nourished . NAD.  HEENT:  Normocephalic . Face symmetric, atraumatic Lungs:  CTA B Normal respiratory effort, no intercostal retractions, no accessory muscle use. Heart: Regular, heart rate around 100.  No pretibial edema bilaterally  Skin: Not pale.  Not jaundice Neurologic:  alert & oriented X3.  Speech normal, gait  assisted with a cane. Psych--  Cognition and judgment appear intact.  Cooperative with normal attention span and concentration.  Behavior appropriate. No anxious or depressed appearing.      Assessment &  Plan:   Assessment >  HTN Dyslipidemia Cardiovascular: --CAD, history of MI, CABG 2010 --Atrial fibrillation -- no coumadin candidate since 04-2014 --CHF H/o SDH,   F/u by intraparenchymal bleed and infection >>>>craniotomy 04/19/2014, 04/23/2014, 06/16/2014, 09/13/2014 On KEPPRA  Non-small cell lung cancer  Dx 12-2014, [facility] to [facility] ~ 06-2015 GERD History of tobacco, EtOH  Plan: HTN: Well-controlled, recent BMP at baseline Depression: Self discontinue Prozac, doing better emotionally CAD: Asx Atrial fibrillation: Heart rate around 100, needs to go back on digoxin, prescription to be sent. On Eliquis. PE per recent CT, chronic? On Eliquis. History of SDH: On Keppra, refer to neurology, to stay on Keppra? Fatigue: Ongoing issue but overall better. G31 and folic acid were check 04-1760 and normal. No further workup at this time. RTC 4 months   Today, I spent more than  25  min with the patient: >50% of the time counseling regards his multiple medical problems, answering a number of questions, reviewing more than 3 notes from other physicians as well as recent labs. All this was done in the presence of the patient.

## 2016-03-08 NOTE — Assessment & Plan Note (Signed)
HTN: Well-controlled, recent BMP at baseline Depression: Self discontinue Prozac, doing better emotionally CAD: Asx Atrial fibrillation: Heart rate around 100, needs to go back on digoxin, prescription to be sent. On Eliquis. PE per recent CT, chronic? On Eliquis. History of SDH: On Keppra, refer to neurology, to stay on Keppra? Fatigue: Ongoing issue but overall better. Q98 and folic acid were check 01-6414 and normal. No further workup at this time. RTC 4 months

## 2016-03-12 ENCOUNTER — Ambulatory Visit: Payer: Medicare Other | Admitting: Neurology

## 2016-05-08 ENCOUNTER — Encounter: Payer: Self-pay | Admitting: Internal Medicine

## 2016-05-08 ENCOUNTER — Ambulatory Visit (INDEPENDENT_AMBULATORY_CARE_PROVIDER_SITE_OTHER): Payer: Medicare Other | Admitting: Internal Medicine

## 2016-05-08 ENCOUNTER — Ambulatory Visit (HOSPITAL_BASED_OUTPATIENT_CLINIC_OR_DEPARTMENT_OTHER)
Admission: RE | Admit: 2016-05-08 | Discharge: 2016-05-08 | Disposition: A | Discharge status: Home / Self Care | Payer: Medicare Other | Source: Ambulatory Visit | Attending: Internal Medicine | Admitting: Internal Medicine

## 2016-05-08 VITALS — BP 128/72 | HR 69 | Temp 97.5°F | Ht 68.0 in | Wt 240.2 lb

## 2016-05-08 DIAGNOSIS — R059 Cough, unspecified: Secondary | ICD-10-CM

## 2016-05-08 DIAGNOSIS — F411 Generalized anxiety disorder: Secondary | ICD-10-CM

## 2016-05-08 DIAGNOSIS — Z8679 Personal history of other diseases of the circulatory system: Secondary | ICD-10-CM

## 2016-05-08 DIAGNOSIS — R918 Other nonspecific abnormal finding of lung field: Secondary | ICD-10-CM | POA: Diagnosis not present

## 2016-05-08 DIAGNOSIS — R05 Cough: Secondary | ICD-10-CM | POA: Diagnosis not present

## 2016-05-08 DIAGNOSIS — I517 Cardiomegaly: Secondary | ICD-10-CM | POA: Diagnosis not present

## 2016-05-08 DIAGNOSIS — I2510 Atherosclerotic heart disease of native coronary artery without angina pectoris: Secondary | ICD-10-CM | POA: Diagnosis not present

## 2016-05-08 DIAGNOSIS — Z85118 Personal history of other malignant neoplasm of bronchus and lung: Secondary | ICD-10-CM

## 2016-05-08 DIAGNOSIS — F1010 Alcohol abuse, uncomplicated: Secondary | ICD-10-CM | POA: Diagnosis not present

## 2016-05-08 MED ORDER — METOPROLOL TARTRATE 50 MG PO TABS: 50.0000 mg | ORAL_TABLET | Freq: Two times a day (BID) | ORAL | Status: DC

## 2016-05-08 MED ORDER — FLUOXETINE HCL 20 MG PO CAPS: 20.0000 mg | ORAL_CAPSULE | Freq: Every day | ORAL | Status: DC

## 2016-05-08 MED ORDER — ALBUTEROL SULFATE HFA 108 (90 BASE) MCG/ACT IN AERS: 2.0000 | INHALATION_SPRAY | RESPIRATORY_TRACT | Status: DC | PRN

## 2016-05-08 NOTE — Patient Instructions (Signed)
  GO TO THE FRONT DESK Schedule your next appointment for a checkup in one month     STOP BY THE FIRST FLOOR:  get the XR   Restart fluoxetine 20 mg: The first week take only half tablet daily, then 1 tablet every day

## 2016-05-08 NOTE — Progress Notes (Signed)
Pre visit review using our clinic review tool, if applicable. No additional management support is needed unless otherwise documented below in the visit note. 

## 2016-05-08 NOTE — Progress Notes (Signed)
Subjective:    Patient ID: Alejandro [name]., male    DOB: 1946/06/19, 70 y.o.   MRN: [id number]  DOS:  05/08/2016 Type of visit - description :  Interval history: Here with his wife, she is concerned because he is more emotional, agitated, sometimes plain belligerent.  Symptoms are going on for a while but they are more marked over the last 2 weeks. Often times the trigger for agitation is listening to the news. He self discontinue Prozac a while back, didn't think he needed it. Also is drinking beer again. He has a history of lung cancer, reports cough and DOE, cough is a slightly worse in the last couple of weeks, + mucus but no hemoptysis.  Labs 02-2016: Potassium 3.7, creatinine 1.2, LFTs normal, hemoglobin 11.0  Review of Systems  denies fever chills No recent head injury but occasionally has a headache. No dysuria, difficulty urinating or gross hematuria Occasionally sees drops of red blood when he wipes after a bowel movement. Otherwise his stools are normal No depression per se but very upset because he is not able to drive; no actual mental confusion.    Past Medical History  Diagnosis Date  . Systolic heart failure ([place])   . 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 Dyslipidemia   . HTN (hypertension)   . Atrial fibrillation ([place])   . Tobacco abuse   . Alcohol abuse   . H/O hiatal hernia   . Myocardial infarction ([place])   . Dysrhythmia     atrial fib  . Stroke ([place]) 5/15  . CHF (congestive heart failure) ([place])   . Shortness of breath     occ  . GERD (gastroesophageal reflux disease)   . S/P chemotherapy, time since 4-12 weeks   . Cancer ([place])     around nose.  New dx of lung cancer  . Non-small cell carcinoma of lung, stage 3 12/16/2014    Past Surgical History  Procedure Laterality Date  . Umbilical hernia repair  2010  . Hernia repair  2012  . Ventral hernia repair N/A 01/22/2013    Procedure: HERNIA REPAIR VENTRAL ADULT;  Surgeon: [name], MD;  Location: [facility];  Service: General;  Laterality: N/A;  . Application of a-cell of chest/abdomen N/A 01/22/2013    Procedure: APPLICATION OF A-CELL OF CHEST/ABDOMEN;  Surgeon: [name], MD;  Location: [facility];  Service: General;  Laterality: N/A;  . Cystoscopy N/A 01/22/2013    Procedure: Consuela Mimes;  Surgeon: [name], MD;  Location: [facility];  Service: Urology;  Laterality: N/A;  Cystoscopy with balloon dilation. Insertion of coude catheter.  . Craniotomy Right 04/19/2014    Procedure: CRANIOTOMY HEMATOMA EVACUATION SUBDURAL;  Surgeon: [name], MD;  Location: [facility];  Service: Neurosurgery;  Laterality: Right;  right  . Craniotomy Right 04/23/2014    Procedure: Craniotomy for Intracerebral Hemorrhage;  Surgeon: [name], MD;  Location: [facility];  Service: Neurosurgery;  Laterality: Right;  . 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 Coronary artery bypass graft  12/24/2008    4 vessel  . Coronary artery bypass graft  2012?  Marland Kitchen Craniotomy N/A 09/13/2014    Procedure: CRANIOTOMY BONE FLAP/PROSTHETIC PLATE;  Surgeon: [name], MD;  Location:  [facility];  Service: Neurosurgery;  Laterality: N/A;  . Video bronchoscopy with endobronchial ultrasound N/A 11/09/2014    Procedure: VIDEO BRONCHOSCOPY WITH ENDOBRONCHIAL ULTRASOUND;  Surgeon: Collene Gobble, MD;  Location: [facility];  Service: Thoracic;  Laterality: N/A;    Social History   Social History  . Marital Status: Married    Spouse Name: N/A  . Number of Children: 2  . Years of Education: N/A   Occupational History  . [employer] - retired    Social History Main Topics  . Smoking status: Former Smoker -- 1.00 packs/day for 50 years    Types: Cigarettes    Quit date: 04/19/2013  . Smokeless tobacco: Never Used  . Alcohol Use: No      Comment: quit  . Drug Use: No  . Sexual Activity: Not on file   Other Topics Concern  . Not on file   Social History Narrative   Lives in [place], [place] with wife.  Ambulates with a cane        Medication List       This list is accurate as of: 05/08/16 11:59 PM.  Always use your most recent med list.               albuterol 108 (90 Base) MCG/ACT inhaler  Commonly known as:  PROVENTIL HFA;VENTOLIN HFA  Inhale 2 puffs into the lungs every 4 (four) hours as needed for wheezing or shortness of breath.     aspirin 81 MG tablet  Take 81 mg by mouth daily.     digoxin 0.125 MG tablet  Commonly known as:  LANOXIN  Take 1 tablet (0.125 mg total) by mouth daily.     ELIQUIS 5 MG Tabs tablet  Generic drug:  apixaban  Take 1 tablet by mouth 2 (two) times daily.     FLUoxetine 20 MG capsule  Commonly known as:  PROZAC  Take 1 capsule (20 mg total) by mouth daily.     furosemide 40 MG tablet  Commonly known as:  LASIX  Take 1 tablet (40 mg total) by mouth daily.     levETIRAcetam 500 MG tablet  Commonly known as:  KEPPRA  Take 1 tablet (500 mg total) by mouth 2 (two) times daily.     lidocaine-prilocaine cream  Commonly known as:  EMLA  Apply 1 application topically as needed.     metoprolol 50 MG tablet  Commonly known as:  LOPRESSOR  Take 1 tablet (50 mg total) by mouth 2 (two) times daily.     multivitamin with minerals Tabs tablet  Take 1 tablet by mouth daily.     oxyCODONE 5 MG immediate release tablet  Commonly known as:  Oxy IR/ROXICODONE  Take 1 tablet (5 mg total) by mouth every 4 (four) hours as needed for moderate pain.     pantoprazole 40 MG tablet  Commonly known as:  PROTONIX  Take 1 tablet (40 mg total) by mouth daily.     tamsulosin 0.4 MG Caps capsule  Commonly known as:  FLOMAX  TAKE ONE CAPSULE BY MOUTH ONCE DAILY           Objective:   Physical Exam BP 128/72 mmHg  Pulse 69  Temp(Src) 97.5 F (36.4 C) (Oral)  Ht '5\' 8"'$  (1.727 m)   Wt 240 lb 4 oz (108.977 kg)  BMI 36.54 kg/m2  SpO2 96% General:   Well developed, well nourished . NAD.  HEENT:  Normocephalic . Face  symmetric; well-healed surgical changes at the scalp  Lungs:  Crease breath sounds Normal respiratory effort, no intercostal retractions, no accessory muscle use. Heart: Seems regular exam,  no murmur.  No pretibial edema bilaterally  Skin: Not pale. Not jaundice Neurologic:  alert & oriented to space, time, self.Marland Kitchen  Speech normal, gait appropriate for age and unassisted Psych--  Cognition and judgment appear intact.  Cooperative with normal attention span and concentration.  Behavior appropriate. No anxious or depressed appearing.      Assessment & Plan:   Assessment >  HTN Dyslipidemia Cardiovascular: --CAD, history of MI, CABG 2010 --Atrial fibrillation -- no coumadin candidate since 04-2014 --CHF H/o SDH,   F/u by intraparenchymal bleed and infection >>>>craniotomy 04/19/2014, 04/23/2014, 06/16/2014, 09/13/2014 On KEPPRA  Non-small cell lung cancer  Dx 12-2014, transfer care to [facility] ~ 06-2015 PE: per CT ([facility]) 2-20-017, seen again in CT 02-16-2016 GERD History of tobacco, EtOH  PLAN: Anxiety: Sx as described above likely due to anxiety, no depression per se, no actual mental status changes or confusion. Recommend patient to go back on Prozac. See instructions History of lung cancer: Increased cough lately, will get a chest x-ray, refill albuterol. EtOH: Strongly recommend to stop drinking. 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 Re-assess on RTC.Marland Kitchen RTC one month, sooner if problems.

## 2016-05-09 ENCOUNTER — Telehealth: Payer: Self-pay | Admitting: Internal Medicine

## 2016-05-09 MED ORDER — FLUOXETINE HCL 20 MG PO TABS: 20.0000 mg | ORAL_TABLET | Freq: Every day | ORAL | Status: DC

## 2016-05-09 NOTE — Telephone Encounter (Signed)
Spoke w/ Pamala Hurry, Pt's wife, informed her that Dr. Larose Kells sent capsules by mistake, that he has sent the tablets and to take 1/2 tablet daily for 1 week and then increase to 20 mg (can use either the tablets or capsules). Pamala Hurry verbalized understanding.

## 2016-05-09 NOTE — Telephone Encounter (Signed)
Please advise 

## 2016-05-09 NOTE — Telephone Encounter (Signed)
Caller name: Pamala Hurry Relationship to patient: wife Can be reached: [phone]  Reason for call: Pt was in yesterday and told to take prozac 1/2 tablet for 1 week then full tablet daily. The pharmacy dispensed capsules and they are not sure if he should take a full capsule or what to do. Please call.

## 2016-05-09 NOTE — Telephone Encounter (Signed)
My apologies, I wrote the Rx for capsules. Will send a new prescription for tablets. Okay to keep the capsules for the time he needs 20 mg qd

## 2016-05-09 NOTE — Assessment & Plan Note (Signed)
Anxiety: Sx as described above likely due to anxiety, no depression per se, no actual mental status changes or confusion. Recommend patient to go back on Prozac. See instructions History of lung cancer: Increased cough lately, will get a chest x-ray, refill albuterol. EtOH: Strongly recommend to stop drinking. History of SDH: Still on Keppra, the patient decided to cancel an appointment w/ neuro  03/12/2016 so I'm not sure if the plan is to keep him in [place] long-term. Re-assess on RTC.Marland Kitchen RTC one month, sooner if problems.

## 2016-05-23 DIAGNOSIS — R590 Localized enlarged lymph nodes: Secondary | ICD-10-CM | POA: Diagnosis not present

## 2016-05-23 DIAGNOSIS — J90 Pleural effusion, not elsewhere classified: Secondary | ICD-10-CM | POA: Diagnosis not present

## 2016-05-23 DIAGNOSIS — C3491 Malignant neoplasm of unspecified part of right bronchus or lung: Secondary | ICD-10-CM | POA: Diagnosis not present

## 2016-05-23 DIAGNOSIS — I2782 Chronic pulmonary embolism: Secondary | ICD-10-CM | POA: Diagnosis not present

## 2016-05-23 DIAGNOSIS — N289 Disorder of kidney and ureter, unspecified: Secondary | ICD-10-CM | POA: Diagnosis not present

## 2016-05-24 DIAGNOSIS — Z79899 Other long term (current) drug therapy: Secondary | ICD-10-CM | POA: Diagnosis not present

## 2016-05-24 DIAGNOSIS — Z951 Presence of aortocoronary bypass graft: Secondary | ICD-10-CM | POA: Diagnosis not present

## 2016-05-24 DIAGNOSIS — E785 Hyperlipidemia, unspecified: Secondary | ICD-10-CM | POA: Diagnosis not present

## 2016-05-24 DIAGNOSIS — R490 Dysphonia: Secondary | ICD-10-CM | POA: Diagnosis not present

## 2016-05-24 DIAGNOSIS — R5383 Other fatigue: Secondary | ICD-10-CM | POA: Diagnosis not present

## 2016-05-24 DIAGNOSIS — C3491 Malignant neoplasm of unspecified part of right bronchus or lung: Secondary | ICD-10-CM | POA: Diagnosis not present

## 2016-05-24 DIAGNOSIS — R590 Localized enlarged lymph nodes: Secondary | ICD-10-CM | POA: Diagnosis not present

## 2016-05-24 DIAGNOSIS — I2782 Chronic pulmonary embolism: Secondary | ICD-10-CM | POA: Diagnosis not present

## 2016-05-24 DIAGNOSIS — I10 Essential (primary) hypertension: Secondary | ICD-10-CM | POA: Diagnosis not present

## 2016-05-24 DIAGNOSIS — Z7901 Long term (current) use of anticoagulants: Secondary | ICD-10-CM | POA: Diagnosis not present

## 2016-05-24 DIAGNOSIS — I2510 Atherosclerotic heart disease of native coronary artery without angina pectoris: Secondary | ICD-10-CM | POA: Diagnosis not present

## 2016-05-24 DIAGNOSIS — N289 Disorder of kidney and ureter, unspecified: Secondary | ICD-10-CM | POA: Diagnosis not present

## 2016-05-24 DIAGNOSIS — I4891 Unspecified atrial fibrillation: Secondary | ICD-10-CM | POA: Diagnosis not present

## 2016-06-06 ENCOUNTER — Other Ambulatory Visit: Payer: Self-pay

## 2016-06-06 MED ORDER — FUROSEMIDE 40 MG PO TABS: 40.0000 mg | ORAL_TABLET | Freq: Every day | ORAL | Status: DC

## 2016-06-27 ENCOUNTER — Ambulatory Visit: Payer: Medicare Other | Admitting: Internal Medicine

## 2016-07-09 ENCOUNTER — Ambulatory Visit: Payer: Medicare Other | Admitting: Internal Medicine

## 2016-07-13 DIAGNOSIS — Z452 Encounter for adjustment and management of vascular access device: Secondary | ICD-10-CM | POA: Diagnosis not present

## 2016-07-18 ENCOUNTER — Ambulatory Visit: Payer: Medicare Other | Admitting: Internal Medicine

## 2016-07-24 IMAGING — CT CT ANGIO CHEST
2 of 7 series · 18 of 36 positions shown · IV contrast (OMNIPAQUE 350)
Comparison: Chest CT 03/11/2015

CLINICAL DATA: Left-sided chest pain and short of breath. Elevated
D-dimer. Non-small cell lung cancer diagnosis December 2014.
Chemotherapy in progress. Radiation therapy complete.

EXAM:
CT ANGIOGRAPHY CHEST WITH CONTRAST
TECHNIQUE: Multidetector CT imaging of the chest was performed using the
standard protocol during bolus administration of intravenous
contrast. Multiplanar CT image reconstructions and MIPs were
obtained to evaluate the vascular anatomy.
CONTRAST:  80mL OMNIPAQUE IOHEXOL 350 MG/ML SOLN

[Series 7: thins for pacs · axial · 0.74mm/px · z∈[-241,-20]mm · 17 of 247 slices shown]
[im 13/247  lung]
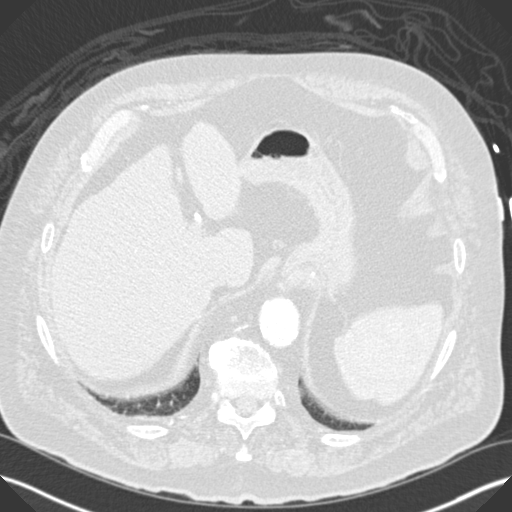
[im 25/247  mediastinal]
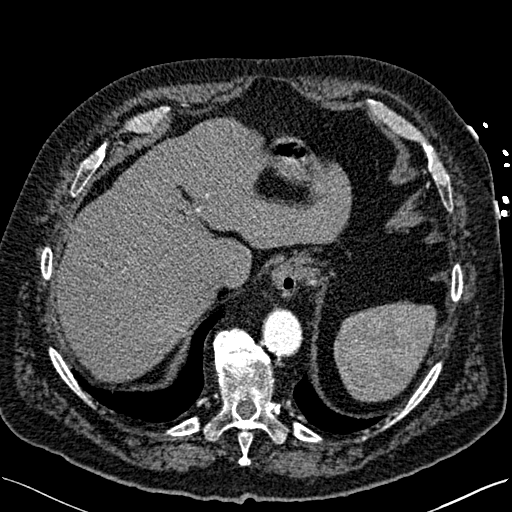
[im 37/247  lung]
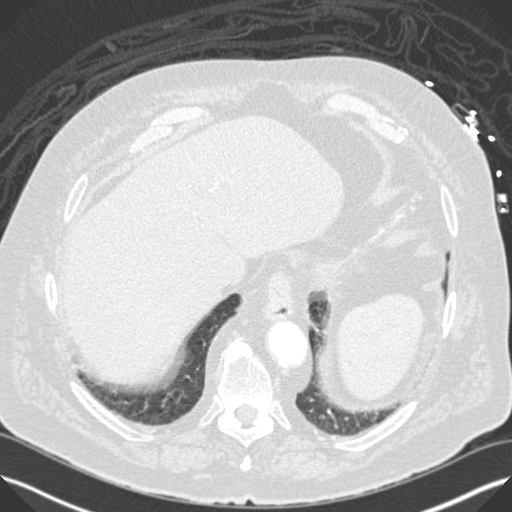
[im 50/247  mediastinal]
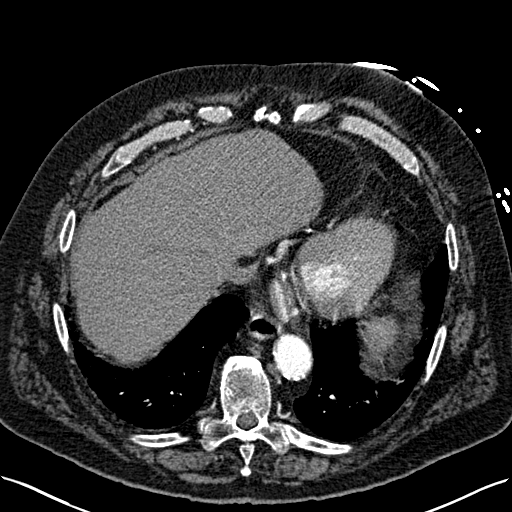
[im 74/247  lung]
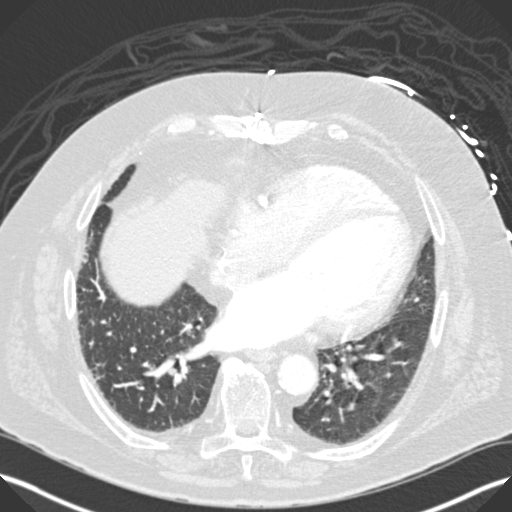
[im 87/247  mediastinal]
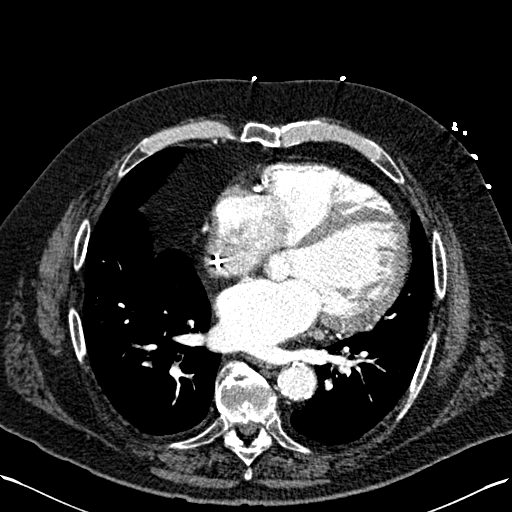
[im 99/247  lung]
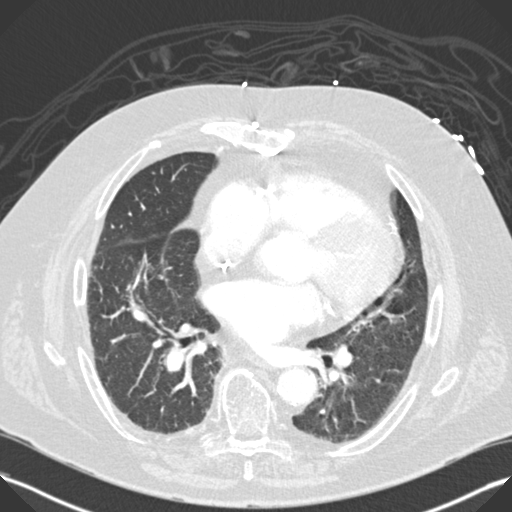
[im 111/247  mediastinal]
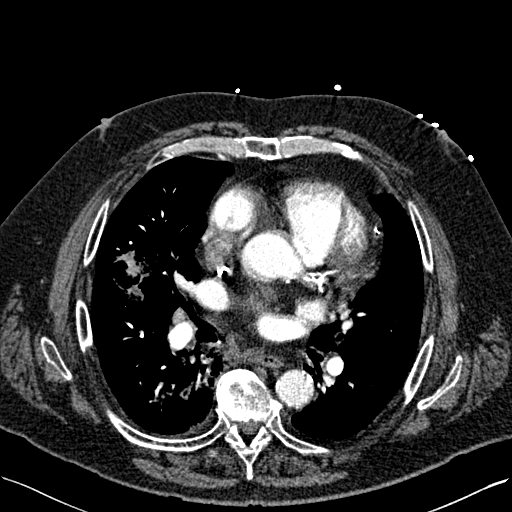
[im 124/247  lung]
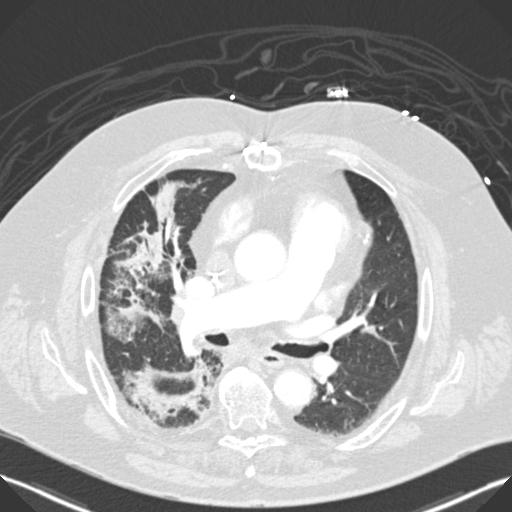
[im 136/247  mediastinal]
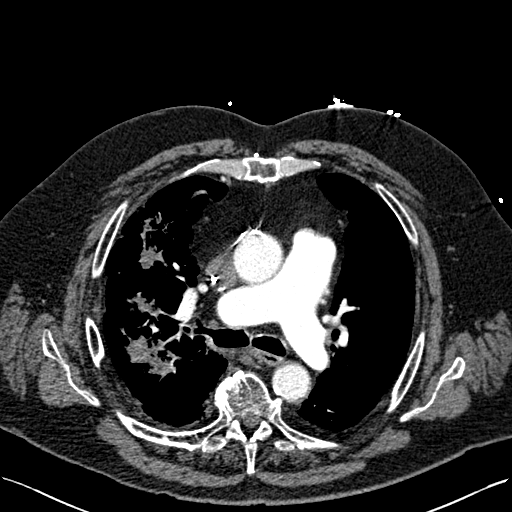
[im 148/247  lung]
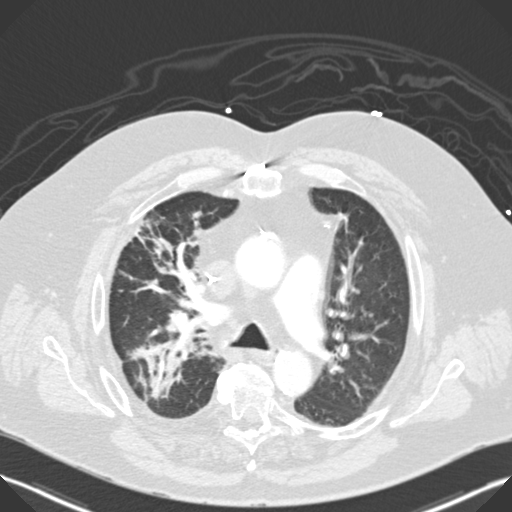
[im 160/247  mediastinal]
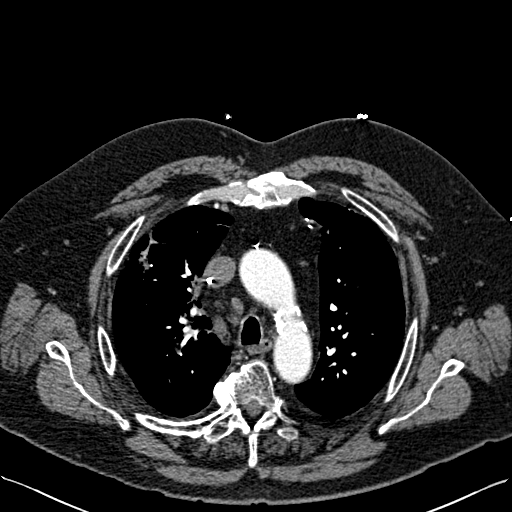
[im 173/247  lung]
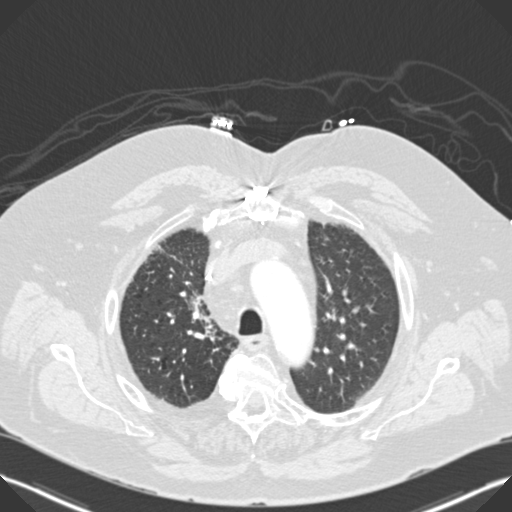
[im 197/247  mediastinal]
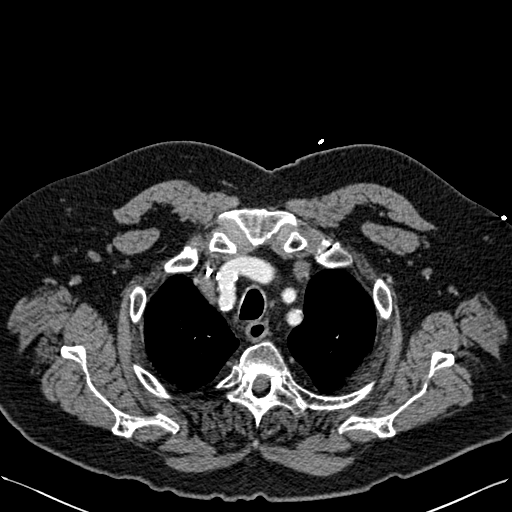
[im 210/247  lung]
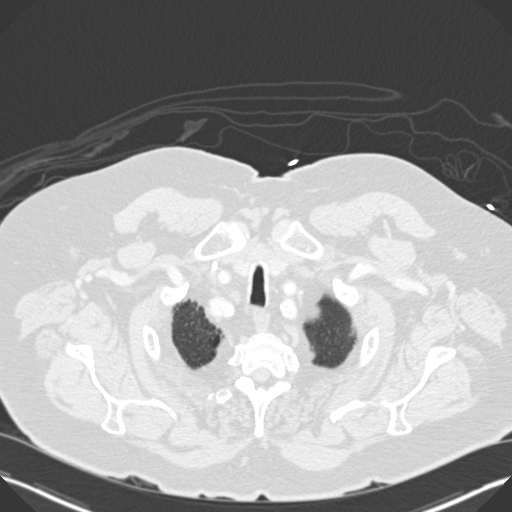
[im 222/247  mediastinal]
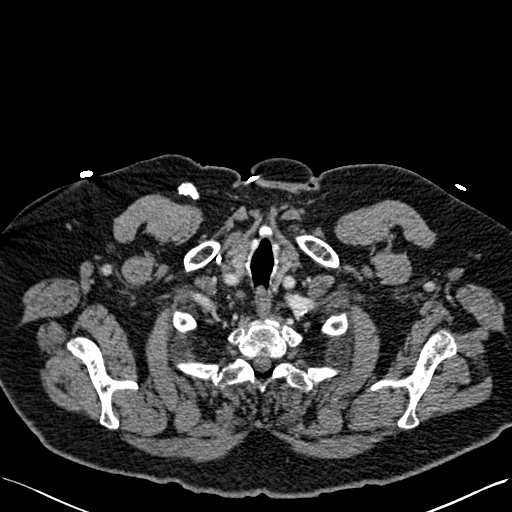
[im 234/247  lung]
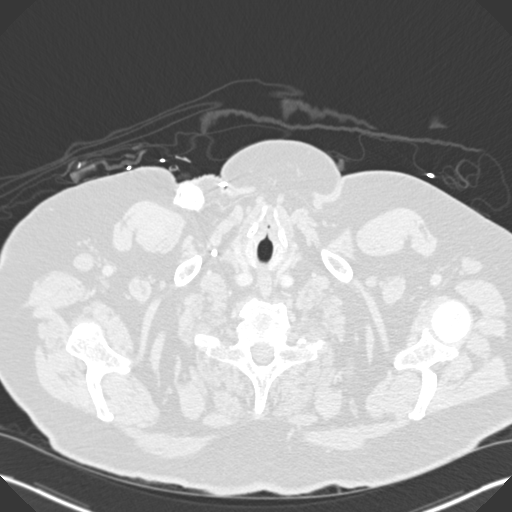

[Series 9: coronal mpr · coronal · 0.47mm/px · 1 of 148 slices shown]
[im 74/148  mediastinal]
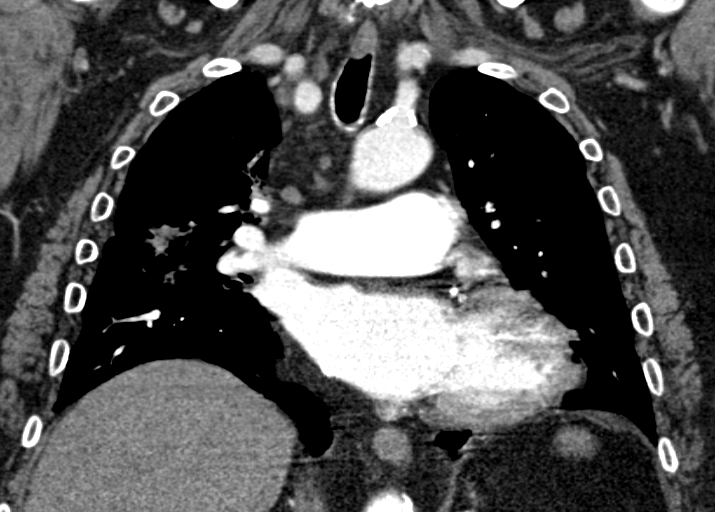

[18 of 36 positions shown; findings below may reference images not displayed]

FINDINGS: Mediastinum/Nodes: No filling defects within the pulmonary arteries
to suggest acute pulmonary embolism. No acute findings of the aorta
great vessels.

No mediastinal lymphadenopathy. Right lower paratracheal lymph node
measures 10 mm short axis decreased from 19 on prior. No pericardial
fluid. Post CABG anatomy

Lungs/Pleura: There is patchy foci of could type consolidation with
air bronchograms in the right upper lobe, right middle lobe and
superior segment of the right lower lobe. There is central para
bronchovascular thickening extending from the right hilum. Left lung
is clear.

Upper abdomen: Limited view of the liver, kidneys, pancreas are
unremarkable. Normal adrenal glands.

Musculoskeletal: No acute osseous abnormality.

Review of the MIP images confirms the above findings.
IMPRESSION: 1. New patchy consolidation in the right upper lobe, right middle
lobe and superior right lower with differential including multifocal
pneumonia, aspiration pneumonitis, or delayed postradiation change.
Lung cancer recurrence is not favored. Unilateral drug reaction
would also be unlikely.
2. No evidence of acute pulmonary embolism.

## 2016-07-24 IMAGING — CR DG CHEST 2V
2 series · 2 of 2 positions shown · non-contrast
Comparison: Multiple priors dating back to 09/07/2014.

CLINICAL DATA: Short of breath. Diarrhea. RIGHT-sided lung cancer.
Left-sided chest pain. Weakness.

EXAM:
CHEST  2 VIEW

[w chest lat]
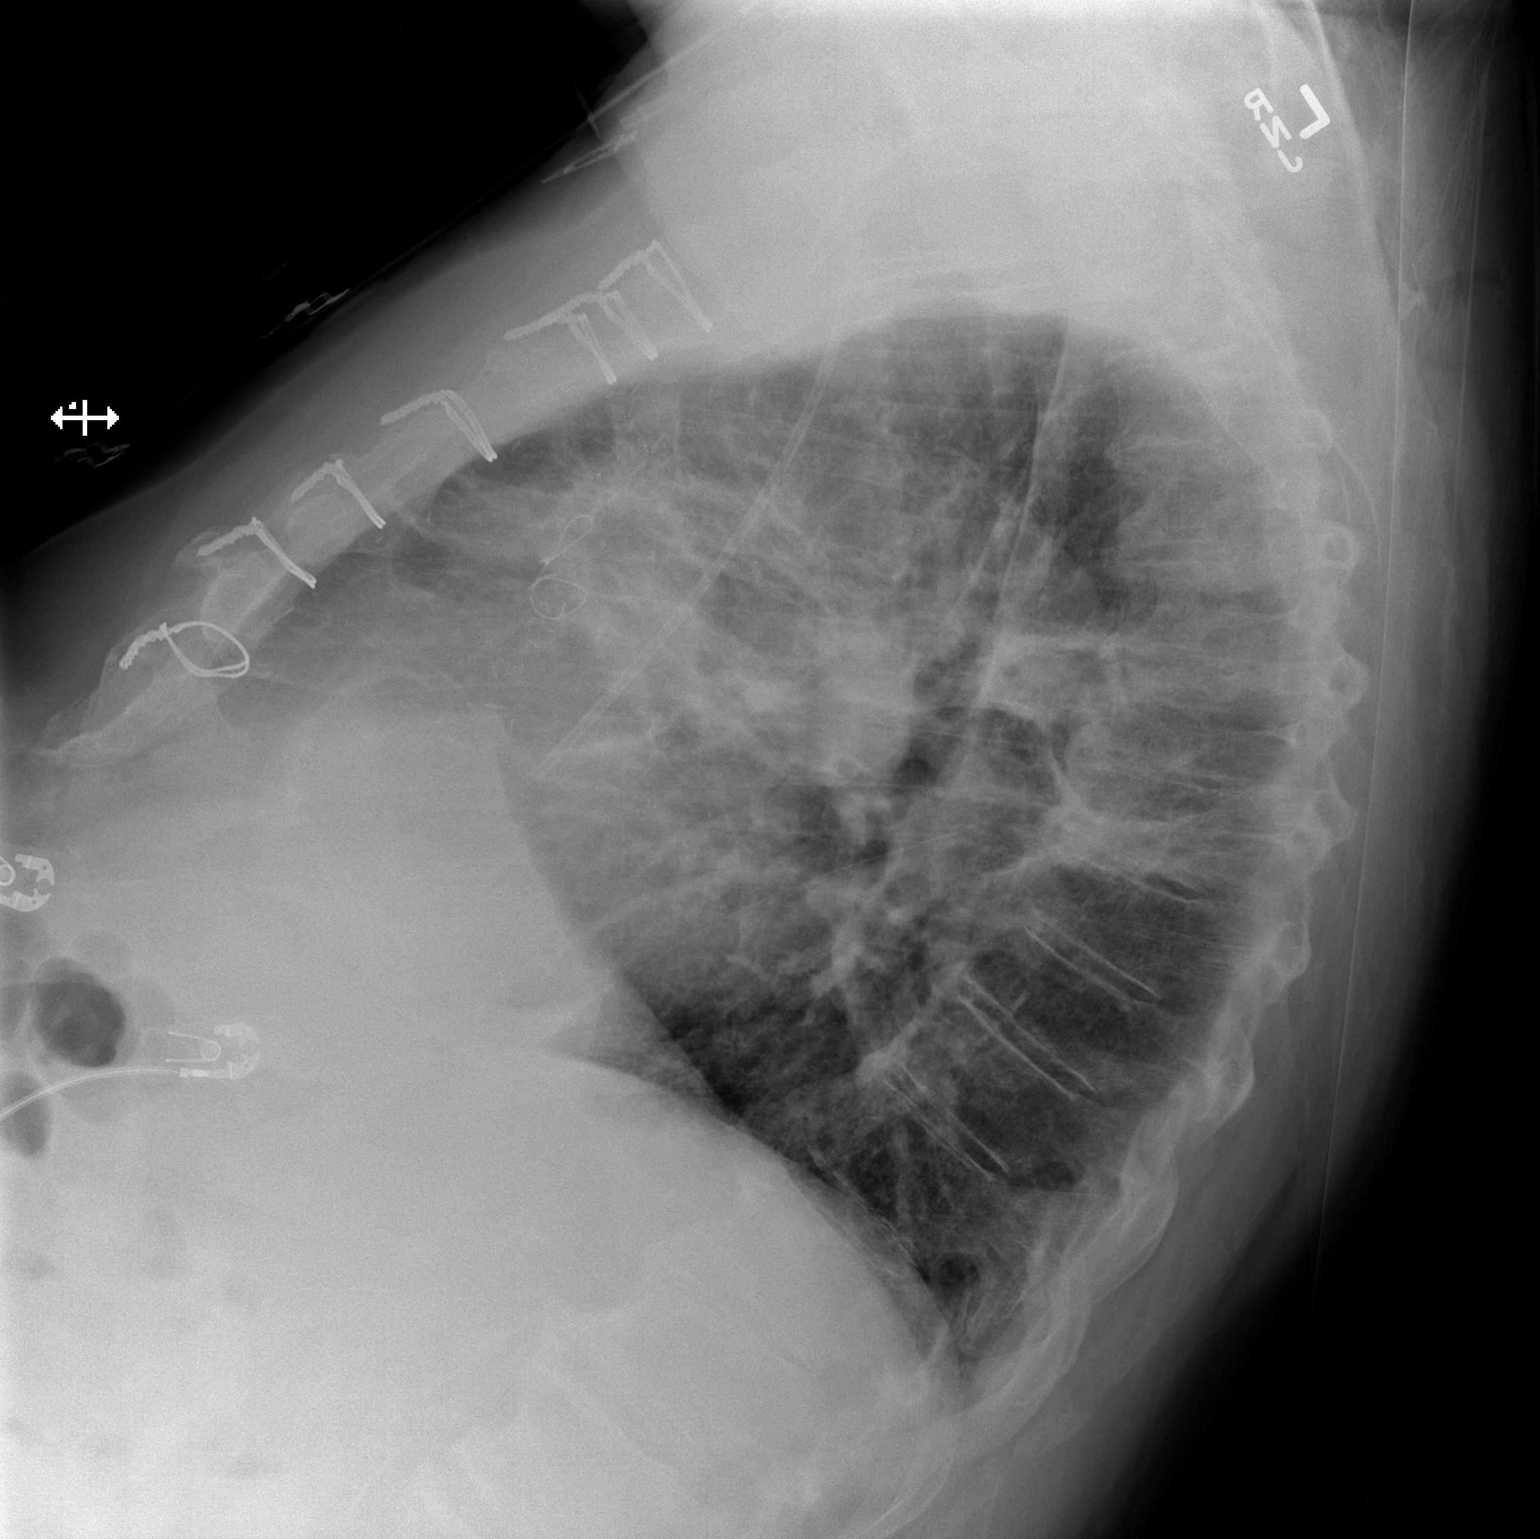

[x chest ap]
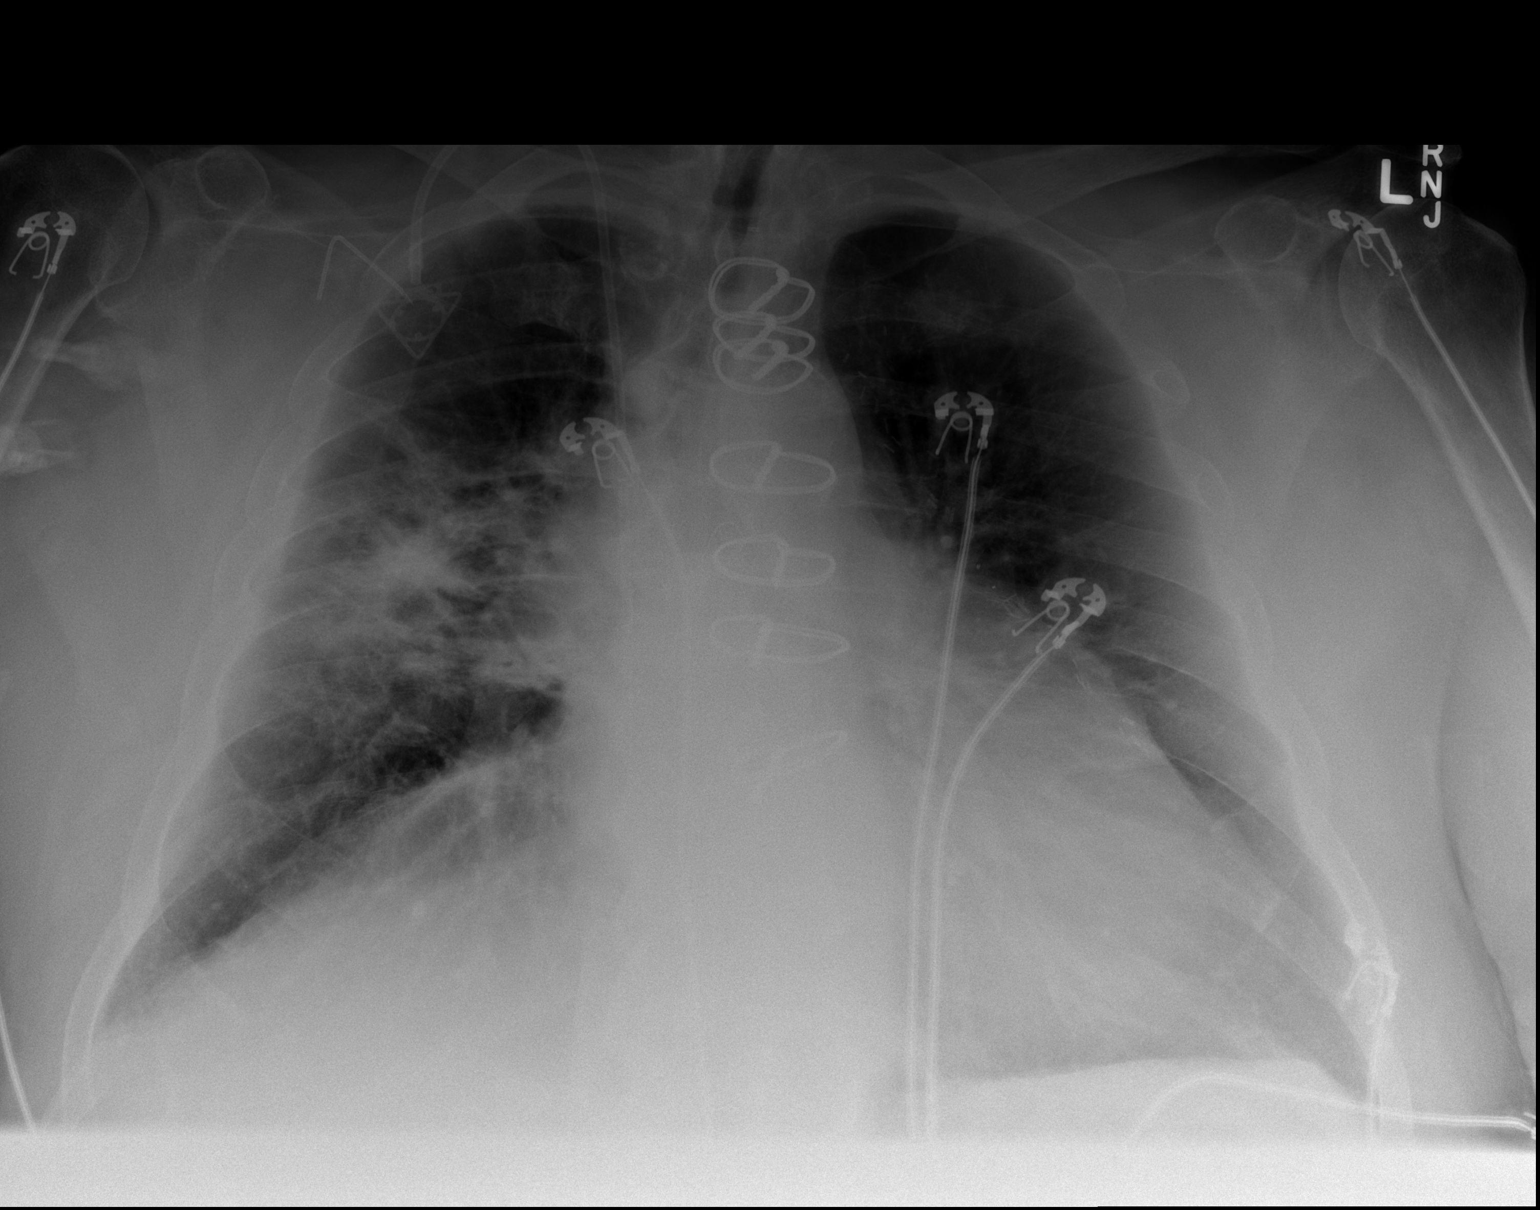

[2 of 2 positions shown; findings below may reference images not displayed]

FINDINGS: Cardiopericardial silhouette enlarged. Median sternotomy. Monitoring
leads project over the chest. RIGHT IJ power port with the tip in
the lower superior vena cava.

There is mild apical lordotic projection on today's chest
radiograph. When compared to 05/11/2014, the opacity in the RIGHT mid
lung continues to be more prominent than on prior chest radiographs,
suggesting progression of tumor in the RIGHT lung. This could also
represent radiation changes if patient is undergoing radiation
therapy.
IMPRESSION: Short term stability of the RIGHT chest nodule with surrounding
airspace opacity. No definite acute cardiopulmonary disease on
today's chest radiograph. Chronic cardiomegaly without failure.

## 2016-09-14 DIAGNOSIS — C3491 Malignant neoplasm of unspecified part of right bronchus or lung: Secondary | ICD-10-CM | POA: Diagnosis not present

## 2016-10-08 ENCOUNTER — Ambulatory Visit (HOSPITAL_BASED_OUTPATIENT_CLINIC_OR_DEPARTMENT_OTHER)
Admission: RE | Admit: 2016-10-08 | Discharge: 2016-10-08 | Disposition: A | Discharge status: Home / Self Care | Payer: Medicare Other | Source: Ambulatory Visit | Attending: Internal Medicine | Admitting: Internal Medicine

## 2016-10-08 ENCOUNTER — Ambulatory Visit (INDEPENDENT_AMBULATORY_CARE_PROVIDER_SITE_OTHER): Payer: Medicare Other | Admitting: Internal Medicine

## 2016-10-08 ENCOUNTER — Encounter: Payer: Self-pay | Admitting: Internal Medicine

## 2016-10-08 VITALS — BP 124/72 | HR 100 | Temp 97.8°F | Resp 14 | Ht 68.0 in | Wt 244.0 lb

## 2016-10-08 DIAGNOSIS — R05 Cough: Secondary | ICD-10-CM | POA: Insufficient documentation

## 2016-10-08 DIAGNOSIS — I2510 Atherosclerotic heart disease of native coronary artery without angina pectoris: Secondary | ICD-10-CM | POA: Diagnosis not present

## 2016-10-08 DIAGNOSIS — I700 Atherosclerosis of aorta: Secondary | ICD-10-CM | POA: Diagnosis not present

## 2016-10-08 DIAGNOSIS — R0602 Shortness of breath: Secondary | ICD-10-CM | POA: Diagnosis not present

## 2016-10-08 DIAGNOSIS — I4891 Unspecified atrial fibrillation: Secondary | ICD-10-CM | POA: Diagnosis not present

## 2016-10-08 DIAGNOSIS — R059 Cough, unspecified: Secondary | ICD-10-CM

## 2016-10-08 MED ORDER — HYDROCOD POLST-CPM POLST ER 10-8 MG/5ML PO SUER: 5.0000 mL | Freq: Two times a day (BID) | ORAL | 0 refills | Status: DC | PRN

## 2016-10-08 NOTE — Patient Instructions (Addendum)
GO TO THE LAB : Get the blood work     GO TO THE FRONT DESK Schedule your next appointment for a  checkup in 2 weeks    STOP BY THE FIRST FLOOR:  get the XR    Take Tussionex twice a day for cough. Do not mix with oxycodone, will cause drowsiness

## 2016-10-08 NOTE — Progress Notes (Signed)
Subjective:    Patient ID: Marcelline Mates., male    DOB: 05/27/1946, 70 y.o.   MRN: [id number]  DOS:  10/08/2016 Type of visit - description : Acute visit, here with his wife Interval history: Patient has a history of lung cancer and  chronic cough, for the last few weeks the cough intensity and frequency have increased. Day or night, sometimes make it difficult to sleep. He has developed a white sputum. He has chronic fatigue, energy is even slightly worse lately, related to not sleeping well?. He has occ wheezing, he uses albuterol with mild relief.    Review of Systems  States that occasionally feel feverish but no actual temperature been checked. No chills No hemoptysis. No weight loss. Chest pain with cough only, shortness of breath with cough only, able to do all his ADLs without problems.  Past Medical History:  Diagnosis Date  . Alcohol abuse   . Atrial fibrillation ([place])   . CAD (coronary artery disease)    s/p CABGx4 on 12/24/2008  . Cancer ([place])    around nose.  New dx of lung cancer  . CHF (congestive heart failure) ([place])   . Dyslipidemia   . Dysrhythmia    atrial fib  . GERD (gastroesophageal reflux disease)   . H/O hiatal hernia   . HTN (hypertension)   . Myocardial infarction   . Non-small cell carcinoma of lung, stage 3 12/16/2014  . S/P chemotherapy, time since 4-12 weeks   . Shortness of breath    occ  . Stroke ([place] ) 5/15  . Systolic heart failure ([place])   . Tobacco abuse     Past Surgical History:  Procedure Laterality Date  . APPLICATION OF A-CELL OF CHEST/ABDOMEN N/A 01/22/2013   Procedure: APPLICATION OF A-CELL OF CHEST/ABDOMEN;  Surgeon: [name], MD;  Location: [facility];  Service: General;  Laterality: N/A;  . CORONARY ARTERY BYPASS GRAFT  12/24/2008   4 vessel  . CORONARY ARTERY BYPASS GRAFT  2012?  . CRANIOTOMY Right 04/19/2014   Procedure: CRANIOTOMY HEMATOMA EVACUATION SUBDURAL;  Surgeon: [name], MD;  Location: [facility];   Service: Neurosurgery;  Laterality: Right;  right  . CRANIOTOMY Right 04/23/2014   Procedure: Craniotomy for Intracerebral Hemorrhage;  Surgeon: [name], MD;  Location: [facility];  Service: Neurosurgery;  Laterality: Right;  . 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 CRANIOTOMY N/A 09/13/2014   Procedure: CRANIOTOMY BONE FLAP/PROSTHETIC PLATE;  Surgeon: [name], MD;  Location: [facility];  Service: Neurosurgery;  Laterality: N/A;  . CYSTOSCOPY N/A 01/22/2013   Procedure: CYSTOSCOPY;  Surgeon: [name], MD;  Location: [facility];  Service: Urology;  Laterality: N/A;  Cystoscopy with balloon dilation. Insertion of coude catheter.  Marland Kitchen HERNIA REPAIR  2012  . UMBILICAL HERNIA REPAIR  2010  . VENTRAL HERNIA REPAIR N/A 01/22/2013   Procedure: HERNIA REPAIR VENTRAL ADULT;  Surgeon: [name], MD;  Location: [facility];  Service: General;  Laterality: N/A;  . VIDEO BRONCHOSCOPY WITH ENDOBRONCHIAL ULTRASOUND N/A 11/09/2014   Procedure: VIDEO BRONCHOSCOPY WITH ENDOBRONCHIAL ULTRASOUND;  Surgeon: Collene Gobble, MD;  Location: [facility];  Service: Thoracic;  Laterality: N/A;    Social History   Social History  . Marital status: Married    Spouse name: N/A  .  Number of children: 2  . Years of education: N/A   Occupational History  . [employer] - retired    Social History Main Topics  . Smoking status: Former Smoker    Packs/day: 1.00    Years: 50.00    Types: Cigarettes    Quit date: 04/19/2013  . Smokeless tobacco: Never Used  . Alcohol use No     Comment: quit  . Drug use: No  . Sexual activity: Not on file   Other Topics Concern  . Not on file   Social History Narrative   Lives in [place], [place] with wife.  Ambulates with a cane        Medication List       Accurate as of 10/08/16 11:59 PM. Always use  your most recent med list.          albuterol 108 (90 Base) MCG/ACT inhaler Commonly known as:  PROVENTIL HFA;VENTOLIN HFA Inhale 2 puffs into the lungs every 4 (four) hours as needed for wheezing or shortness of breath.   aspirin 81 MG tablet Take 81 mg by mouth daily.   chlorpheniramine-HYDROcodone 10-8 MG/5ML Suer Commonly known as:  TUSSIONEX PENNKINETIC ER Take 5 mLs by mouth every 12 (twelve) hours as needed for cough.   digoxin 0.125 MG tablet Commonly known as:  LANOXIN Take 1 tablet (0.125 mg total) by mouth daily.   ELIQUIS 5 MG Tabs tablet Generic drug:  apixaban Take 1 tablet by mouth 2 (two) times daily.   FLUoxetine 20 MG tablet Commonly known as:  PROZAC Take 1 tablet (20 mg total) by mouth daily. The first week, take only half tablet daily   furosemide 40 MG tablet Commonly known as:  LASIX Take 1 tablet (40 mg total) by mouth daily.   levETIRAcetam 500 MG tablet Commonly known as:  KEPPRA Take 1 tablet (500 mg total) by mouth 2 (two) times daily.   lidocaine-prilocaine cream Commonly known as:  EMLA Apply 1 application topically as needed.   metoprolol 50 MG tablet Commonly known as:  LOPRESSOR Take 1 tablet (50 mg total) by mouth 2 (two) times daily.   multivitamin with minerals Tabs tablet Take 1 tablet by mouth daily.   oxyCODONE 5 MG immediate release tablet Commonly known as:  Oxy IR/ROXICODONE Take 1 tablet (5 mg total) by mouth every 4 (four) hours as needed for moderate pain.   pantoprazole 40 MG tablet Commonly known as:  PROTONIX Take 1 tablet (40 mg total) by mouth daily.   tamsulosin 0.4 MG Caps capsule Commonly known as:  FLOMAX TAKE ONE CAPSULE BY MOUTH ONCE DAILY          Objective:   Physical Exam BP 124/72 (BP Location: Left Arm, Patient Position: Sitting, Cuff Size: Normal)   Pulse 100   Temp 97.8 F (36.6 C) (Oral)   Resp 14   Ht '5\' 8"'$  (1.727 m)   Wt 244 lb (110.7 kg)   SpO2 96%   BMI 37.10 kg/m  General:     Well developed, well nourished . NAD.  HEENT:  Normocephalic . Face symmetric, atraumatic. Neck: No JVD at 45, no neck or supraclavicular LAD or mass. Arms: Symmetric Lungs:  Few end expiratory wheezes otherwise lungs are clear. Normal respiratory effort, no intercostal retractions, no accessory muscle use. Heart: Irregular, heart rate increases with cough..  Race pretibial edema bilaterally  Abdomen:  Not distended, soft, non-tender. No rebound or rigidity.  Skin: Not pale. Not jaundice Neurologic:  alert & oriented X3.  Speech normal, gait assisted by a walker, at baseline. Psych--  Cognition and judgment appear intact.  Cooperative with normal attention span and concentration.  Behavior appropriate. No anxious or depressed appearing.    Assessment & Plan:  Assessment >  HTN Dyslipidemia Cardiovascular:  Dr Quentin Cornwall --CAD, history of MI, CABG 2010 --Atrial fibrillation -- no coumadin candidate since 04-2014 --CHF H/o SDH,   F/u by intraparenchymal bleed and infection >>>>craniotomy 04/19/2014, 04/23/2014, 06/16/2014, 09/13/2014 On KEPPRA  Non-small cell lung cancer  Dx 12-2014, transfer care to [facility] ~ 06-2015 PE: per CT ([facility]) 2-20-017, seen again in CT 02-16-2016 GERD History of tobacco, EtOH  PLAN: Cough: 70 year old gentleman with history of non-small cell lung cancer, status post chemotherapy, radiation therapy, last CT 05/2016 with no evidence of recurrence. He presents with cough. No fever, chills or weight loss. Exam essentially benign. Plan:, CMP, CBC. To the next, further advise with results A. Fibrillation: On digoxin, checking labels. Heart rate was a slightly elevated today in the context of a cough spell. HTN: Controlled.

## 2016-10-08 NOTE — Progress Notes (Signed)
Pre visit review using our clinic review tool, if applicable. No additional management support is needed unless otherwise documented below in the visit note. 

## 2016-10-09 LAB — CBC WITH DIFFERENTIAL/PLATELET
BASOS PCT: 0.5 % (ref 0.0–3.0)
Basophils Absolute: 0 10*3/uL (ref 0.0–0.1)
EOS ABS: 0.2 10*3/uL (ref 0.0–0.7)
EOS PCT: 2.2 % (ref 0.0–5.0)
HEMATOCRIT: 41 % (ref 39.0–52.0)
HEMOGLOBIN: 13.8 g/dL (ref 13.0–17.0)
LYMPHS PCT: 17.3 % (ref 12.0–46.0)
Lymphs Abs: 1.4 10*3/uL (ref 0.7–4.0)
MCHC: 33.7 g/dL (ref 30.0–36.0)
MCV: 92.6 fl (ref 78.0–100.0)
Monocytes Absolute: 0.6 10*3/uL (ref 0.1–1.0)
Monocytes Relative: 7.8 % (ref 3.0–12.0)
Neutro Abs: 5.9 10*3/uL (ref 1.4–7.7)
Neutrophils Relative %: 72.2 % (ref 43.0–77.0)
Platelets: 191 10*3/uL (ref 150.0–400.0)
RBC: 4.43 Mil/uL (ref 4.22–5.81)
RDW: 15.1 % (ref 11.5–15.5)
WBC: 8.1 10*3/uL (ref 4.0–10.5)

## 2016-10-09 LAB — COMPREHENSIVE METABOLIC PANEL
ALBUMIN: 3.8 g/dL (ref 3.5–5.2)
ALK PHOS: 67 U/L (ref 39–117)
ALT: 26 U/L (ref 0–53)
AST: 28 U/L (ref 0–37)
BILIRUBIN TOTAL: 0.8 mg/dL (ref 0.2–1.2)
BUN: 14 mg/dL (ref 6–23)
CALCIUM: 9.6 mg/dL (ref 8.4–10.5)
CHLORIDE: 102 meq/L (ref 96–112)
CO2: 28 mEq/L (ref 19–32)
CREATININE: 1.31 mg/dL (ref 0.40–1.50)
GFR: 57.29 mL/min — ABNORMAL LOW (ref >60.00)
Glucose, Bld: 91 mg/dL (ref 70–99)
Potassium: 4.5 mEq/L (ref 3.5–5.1)
Sodium: 138 mEq/L (ref 135–145)
Total Protein: 7.5 g/dL (ref 6.0–8.3)

## 2016-10-09 LAB — DIGOXIN LEVEL

## 2016-10-09 NOTE — Assessment & Plan Note (Signed)
Cough: 70 year old gentleman with history of non-small cell lung cancer, status post chemotherapy, radiation therapy, last CT 05/2016 with no evidence of recurrence. He presents with cough. No fever, chills or weight loss. Exam essentially benign. Plan:, CMP, CBC. To the next, further advise with results A. Fibrillation: On digoxin, checking labels. Heart rate was a slightly elevated today in the context of a cough spell. HTN: Controlled.

## 2016-10-12 ENCOUNTER — Encounter (HOSPITAL_BASED_OUTPATIENT_CLINIC_OR_DEPARTMENT_OTHER): Payer: Self-pay

## 2016-10-12 ENCOUNTER — Telehealth: Payer: Self-pay | Admitting: Internal Medicine

## 2016-10-12 ENCOUNTER — Telehealth: Payer: Self-pay | Admitting: Family Medicine

## 2016-10-12 ENCOUNTER — Telehealth: Payer: Self-pay | Admitting: Pulmonary Disease

## 2016-10-12 ENCOUNTER — Ambulatory Visit (HOSPITAL_BASED_OUTPATIENT_CLINIC_OR_DEPARTMENT_OTHER)
Admission: RE | Admit: 2016-10-12 | Discharge: 2016-10-12 | Disposition: A | Discharge status: Home / Self Care | Payer: Medicare Other | Source: Ambulatory Visit | Attending: Internal Medicine | Admitting: Internal Medicine

## 2016-10-12 DIAGNOSIS — R059 Cough, unspecified: Secondary | ICD-10-CM

## 2016-10-12 DIAGNOSIS — R05 Cough: Secondary | ICD-10-CM | POA: Insufficient documentation

## 2016-10-12 DIAGNOSIS — R918 Other nonspecific abnormal finding of lung field: Secondary | ICD-10-CM | POA: Diagnosis not present

## 2016-10-12 DIAGNOSIS — J90 Pleural effusion, not elsewhere classified: Secondary | ICD-10-CM | POA: Diagnosis not present

## 2016-10-12 DIAGNOSIS — Z85118 Personal history of other malignant neoplasm of bronchus and lung: Secondary | ICD-10-CM | POA: Insufficient documentation

## 2016-10-12 DIAGNOSIS — I2699 Other pulmonary embolism without acute cor pulmonale: Secondary | ICD-10-CM | POA: Diagnosis not present

## 2016-10-12 MED ORDER — IOPAMIDOL (ISOVUE-300) INJECTION 61%
80.0000 mL | Freq: Once | INTRAVENOUS | Status: AC | PRN
  Administered 2016-10-12: 100 mL via INTRAVENOUS

## 2016-10-12 MED ORDER — IOPAMIDOL (ISOVUE-300) INJECTION 61%: 100.0000 mL | Freq: Once | INTRAVENOUS | Status: DC | PRN

## 2016-10-12 NOTE — Telephone Encounter (Signed)
Schedule CT chest with contrast DX lung cancer, increased cough.

## 2016-10-12 NOTE — Telephone Encounter (Signed)
Alejandro [name] patient with lung cancer. CT scan with pleural effusion. Dr. Larose Kells called to ask if we will see him ASAP for tap. Please schedule with Alejandro [name] or the next available provider.

## 2016-10-12 NOTE — Telephone Encounter (Signed)
FYI. Message was sent to Gilmore Laroche, wrong MD CMA.

## 2016-10-12 NOTE — Telephone Encounter (Signed)
See CT report, has a new pleural effusion, spoke w/ Dr Archie Balboa , oncology [facility], pt needs to see pulmonary next week; spoke w/ pulmonary on call for [facility], they will contact pt on Monday, pt's wife notified

## 2016-10-12 NOTE — Telephone Encounter (Signed)
Received on call report from radiology.  Pt has hx of lung cancer and recent increased cough.  CT chest showed pulmonary embolus on left which radiologist felt was likely NOT acute.  He is already on Eliquis for hx of A fib.  Since pt is in no acute respiratory distress and this did not appear acute did not see need for acute intervention at this time.    Eulas Post MD [facility]

## 2016-10-12 NOTE — Telephone Encounter (Signed)
CT chest w/ ordered.

## 2016-10-12 NOTE — Telephone Encounter (Signed)
Caller name: Pamala Hurry Relationship to patient: Wife Can be reached: [phone]  Pharmacy:  Reason for call: Request to have Chest CT done hear instead of at [facility]

## 2016-10-15 NOTE — Telephone Encounter (Signed)
Pt aware of the appointment and agree to the time and date.Alejandro [name] # [phone].Alejandro [name]

## 2016-10-15 NOTE — Telephone Encounter (Signed)
Appointment was not scheduled. I have scheduled the appointment at the appointed date and time. Nothing further was needed.

## 2016-10-15 NOTE — Telephone Encounter (Signed)
lmtcb x1 for pt. I have held a spot on RB's schedule on 10/23/16 at 10:30am.

## 2016-10-22 ENCOUNTER — Encounter: Payer: Self-pay | Admitting: Internal Medicine

## 2016-10-22 ENCOUNTER — Ambulatory Visit (INDEPENDENT_AMBULATORY_CARE_PROVIDER_SITE_OTHER): Payer: Medicare Other | Admitting: Internal Medicine

## 2016-10-22 VITALS — BP 128/78 | HR 82 | Temp 97.4°F | Resp 14 | Ht 68.0 in | Wt 240.4 lb

## 2016-10-22 DIAGNOSIS — C3411 Malignant neoplasm of upper lobe, right bronchus or lung: Secondary | ICD-10-CM | POA: Diagnosis not present

## 2016-10-22 DIAGNOSIS — R05 Cough: Secondary | ICD-10-CM

## 2016-10-22 DIAGNOSIS — R059 Cough, unspecified: Secondary | ICD-10-CM

## 2016-10-22 DIAGNOSIS — I2510 Atherosclerotic heart disease of native coronary artery without angina pectoris: Secondary | ICD-10-CM | POA: Diagnosis not present

## 2016-10-22 DIAGNOSIS — I4891 Unspecified atrial fibrillation: Secondary | ICD-10-CM

## 2016-10-22 DIAGNOSIS — Z09 Encounter for follow-up examination after completed treatment for conditions other than malignant neoplasm: Secondary | ICD-10-CM | POA: Diagnosis not present

## 2016-10-22 NOTE — Progress Notes (Signed)
Pre visit review using our clinic review tool, if applicable. No additional management support is needed unless otherwise documented below in the visit note. 

## 2016-10-22 NOTE — Progress Notes (Signed)
Subjective:    Patient ID: Alejandro [name]., male    DOB: Dec 20, 1945, 70 y.o.   MRN: [id number]  DOS:  10/22/2016 Type of visit - description : Follow-up Interval history: Was seen recently with increased cough, since then had a chest x-ray and a CT scan that showed a new right pleural effusion. Overall feels about the same, was unable to get Tussionex due to cost   Review of Systems No fever chills Breathing at baseline No chest pain Cough is responding some to Delsym OTC  Past Medical History:  Diagnosis Date  . Alcohol abuse   . Atrial fibrillation ([place])   . CAD (coronary artery disease)    s/p CABGx4 on 12/24/2008  . Cancer ([place])    around nose.  New dx of lung cancer  . CHF (congestive heart failure) ([place])   . Dyslipidemia   . Dysrhythmia    atrial fib  . GERD (gastroesophageal reflux disease)   . H/O hiatal hernia   . HTN (hypertension)   . Myocardial infarction   . Non-small cell carcinoma of lung, stage 3 12/16/2014  . S/P chemotherapy, time since 4-12 weeks   . Shortness of breath    occ  . Stroke ([place]) 5/15  . Systolic heart failure ([place])   . Tobacco abuse     Past Surgical History:  Procedure Laterality Date  . APPLICATION OF A-CELL OF CHEST/ABDOMEN N/A 01/22/2013   Procedure: APPLICATION OF A-CELL OF CHEST/ABDOMEN;  Surgeon: [name], MD;  Location: [facility];  Service: General;  Laterality: N/A;  . CORONARY ARTERY BYPASS GRAFT  12/24/2008   4 vessel  . CORONARY ARTERY BYPASS GRAFT  2012?  . CRANIOTOMY Right 04/19/2014   Procedure: CRANIOTOMY HEMATOMA EVACUATION SUBDURAL;  Surgeon: [name], MD;  Location: [place] [facility];  Service: Neurosurgery;  Laterality: Right;  right  . CRANIOTOMY Right 04/23/2014   Procedure: Craniotomy for Intracerebral Hemorrhage;  Surgeon: [name], MD;  Location: [place] [facility]UR[facility];  Service: Neurosurgery;  Laterality: Right;  . 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 CRANIOTOMY N/A 09/13/2014   Procedure: CRANIOTOMY BONE FLAP/PROSTHETIC PLATE;  Surgeon: [name], MD;  Location: [place] [facility];  Service: Neurosurgery;  Laterality: N/A;  . CYSTOSCOPY N/A 01/22/2013   Procedure: CYSTOSCOPY;  Surgeon: [name], MD;  Location: [facility];  Service: Urology;  Laterality: N/A;  Cystoscopy with balloon dilation. Insertion of coude catheter.  [facility] Kitchen HERNIA REPAIR  2012  . UMBILICAL HERNIA REPAIR  2010  . VENTRAL HERNIA REPAIR N/A 01/22/2013   Procedure: HERNIA REPAIR VENTRAL ADULT;  Surgeon: [name], MD;  Location: [facility];  Service: General;  Laterality: N/A;  . VIDEO BRONCHOSCOPY WITH ENDOBRONCHIAL ULTRASOUND N/A 11/09/2014   Procedure: VIDEO BRONCHOSCOPY WITH ENDOBRONCHIAL ULTRASOUND;  Surgeon: Collene Gobble, MD;  Location: [facility];  Service: Thoracic;  Laterality: N/A;    Social History   Social History  . Marital status: Married    Spouse name: N/A  . Number of children: 2  . Years of education: N/A   Occupational History  . [employer] - retired    Social History Main Topics  . Smoking status: Former Smoker    Packs/day: 1.00    Years: 50.00    Types: Cigarettes    Quit date:  04/19/2013  . Smokeless tobacco: Never Used  . Alcohol use No     Comment: quit  . Drug use: No  . Sexual activity: Not on file   Other Topics Concern  . Not on file   Social History Narrative   Lives in [place], [place] with wife.  Ambulates with a cane        Medication List       Accurate as of 10/22/16 11:59 PM. Always use your most recent med list.          albuterol 108 (90 Base) MCG/ACT inhaler Commonly known as:  PROVENTIL HFA;VENTOLIN HFA Inhale 2 puffs into the lungs every 4 (four) hours as needed for wheezing or shortness of breath.   aspirin 81 MG tablet Take 81 mg by mouth daily.   DELSYM COUGH RELIEF MT Take by mouth daily as  needed (COUGH).   digoxin 0.125 MG tablet Commonly known as:  LANOXIN Take 0.25 mg by mouth daily.   ELIQUIS 5 MG Tabs tablet Generic drug:  apixaban Take 1 tablet by mouth 2 (two) times daily.   FLUoxetine 20 MG tablet Commonly known as:  PROZAC Take 1 tablet (20 mg total) by mouth daily. The first week, take only half tablet daily   furosemide 40 MG tablet Commonly known as:  LASIX Take 1 tablet (40 mg total) by mouth daily.   levETIRAcetam 500 MG tablet Commonly known as:  KEPPRA Take 1 tablet (500 mg total) by mouth 2 (two) times daily.   lidocaine-prilocaine cream Commonly known as:  EMLA Apply 1 application topically as needed.   metoprolol 50 MG tablet Commonly known as:  LOPRESSOR Take 1 tablet (50 mg total) by mouth 2 (two) times daily.   multivitamin with minerals Tabs tablet Take 1 tablet by mouth daily.   oxyCODONE 5 MG immediate release tablet Commonly known as:  Oxy IR/ROXICODONE Take 1 tablet (5 mg total) by mouth every 4 (four) hours as needed for moderate pain.   pantoprazole 40 MG tablet Commonly known as:  PROTONIX Take 1 tablet (40 mg total) by mouth daily.   tamsulosin 0.4 MG Caps capsule Commonly known as:  FLOMAX TAKE ONE CAPSULE BY MOUTH ONCE DAILY          Objective:   Physical Exam BP 128/78 (BP Location: Left Arm, Patient Position: Sitting, Cuff Size: Normal)   Pulse 82   Temp 97.4 F (36.3 C) (Oral)   Resp 14   Ht '5\' 8"'$  (1.727 m)   Wt 240 lb 6 oz (109 kg)   SpO2 96%   BMI 36.55 kg/m   General:   Well developed, well nourished . NAD.  HEENT:  Normocephalic . Face symmetric, atraumatic Lungs:  Decreased breath sounds but otherwise clear Normal respiratory effort, no intercostal retractions, no accessory muscle use. Heart: irregular.  Skin: Not pale. Not jaundice Neurologic:  alert & oriented X3.  Speech normal, gait appropriate for age and unassisted Psych--  Cognition and judgment appear intact.  Cooperative with  normal attention span and concentration.  Behavior appropriate. No anxious or depressed appearing.       Assessment & Plan:   Assessment >  HTN Dyslipidemia Cardiovascular:  Dr Quentin Cornwall --CAD, history of MI, CABG 2010 --Atrial fibrillation -- no coumadin candidate since 04-2014 --CHF H/o SDH,   F/u by intraparenchymal bleed and infection >>>>craniotomy 04/19/2014, 04/23/2014, 06/16/2014, 09/13/2014 On KEPPRA  Non-small cell lung cancer  Dx 12-2014, transfer care to [facility] ~ 06-2015 PE: per  CT ([facility]) 2-20-017, seen again in CT 02-16-2016 GERD History of tobacco, EtOH  PLAN: Cough: Slightly better? Likely due to pleural effusion Lung cancer: Recently seen with increased cough, chest x-ray was a stable, CT show a new right pleural effusion. He has an appointment to see pulmonology referral tomorrow. Results d/w pt and his wife in detail A. fib: Digoxin level is slightly low, digoxin dose increased, will check labs in 2-3 weeks. RTC 3-4 months. His routine checkup

## 2016-10-22 NOTE — Patient Instructions (Signed)
GO TO THE FRONT DESK Schedule your next appointment for a  Check up in 3 to 4 months   Schedule labs to be done in 2-3 weeks: digoxin level, dx A. Fib

## 2016-10-23 ENCOUNTER — Other Ambulatory Visit: Payer: Self-pay | Admitting: Emergency Medicine

## 2016-10-23 ENCOUNTER — Ambulatory Visit (INDEPENDENT_AMBULATORY_CARE_PROVIDER_SITE_OTHER): Payer: Medicare Other | Admitting: Emergency Medicine

## 2016-10-23 ENCOUNTER — Encounter: Payer: Self-pay | Admitting: Emergency Medicine

## 2016-10-23 ENCOUNTER — Ambulatory Visit (HOSPITAL_COMMUNITY)
Admission: RE | Admit: 2016-10-23 | Discharge: 2016-10-23 | Disposition: A | Discharge status: Home / Self Care | Payer: Medicare Other | Source: Ambulatory Visit | Attending: Emergency Medicine | Admitting: Emergency Medicine

## 2016-10-23 ENCOUNTER — Telehealth: Payer: Self-pay | Admitting: Emergency Medicine

## 2016-10-23 DIAGNOSIS — J90 Pleural effusion, not elsewhere classified: Secondary | ICD-10-CM | POA: Insufficient documentation

## 2016-10-23 DIAGNOSIS — Z23 Encounter for immunization: Secondary | ICD-10-CM

## 2016-10-23 DIAGNOSIS — I2510 Atherosclerotic heart disease of native coronary artery without angina pectoris: Secondary | ICD-10-CM

## 2016-10-23 NOTE — Progress Notes (Signed)
  HPI: 70 yo man follows for RUL mass and mediastinal LAD. We performed EBUS, got good nodal tissue but no dx. He follows now to discuss next options. He had a repeat CT scan today. He is feeling well s/p our bronchoscopy.   ROV 03/22/15 -- 70 yo former smoker (40-50 pk-yrs) patient follows for squamous cell lung cancer. He's been treated by Dr. Julien Nordmann with concurrent chemoradiation, last dose was about 5 weeks ago. CT scan of the chest performed 03/11/15 shows a decrease in size of his right upper lobe mass and similar mediastinal lymphadenopathy.  He is to restart chemo at the end of this month.  He has a lot of fatigue, has dyspnea that his wife points out is severe. He is not on any BD.   ROV 04/21/15 -- follow-up visit for squamous cell lung cancer and history of tobacco use with suspected COPD. Last visit we started Spiriva to see if he will benefit - hard to say if has been helpful. He has had a raspy voice ever since his EBUS, actually even before when he had an NGT back a year ago. He has some nasal congestion. He is on loratadine daily.   ROV 10/23/16 -- patient is a former smoker with a history of squamous cell lung cancer which was treated with concurrent chemoradiation. Most recent cancer evaluation was 05/24/16 by H/O at [facility]. He also history of pulmonary embolism and DVT. He was seen by Dr Larose Kells this month for increased cough and SOB, more fatigue. CT scan chst was done 10/12/16 that I have personally reviewed, shows evidence for pulmonary emboli that were felt to be chronic, right upper lobe and middle lobe infiltrates, a new prevascular lymph node and a new moderate right pleural effusion. He is currently on Eliquis. He is using albuterol prn, rarely.     Vitals:   10/23/16 1115  BP: 116/70  Pulse: 100  SpO2: 94%  Weight: 241 lb (109.3 kg)  Height: '5\' 8"'$  (1.727 m)   Gen: Pleasant, well-nourished, in no distress,  normal affect  ENT: No lesions,  mouth clear,  oropharynx clear,  no postnasal drip  Neck: No JVD, no TMG, no carotid bruits  Lungs: No use of accessory muscles, distant, clear without rales or rhonchi  Cardiovascular: RRR, heart sounds normal, no murmur or gallops, no peripheral edema  Musculoskeletal: No deformities, no cyanosis or clubbing  Neuro: alert, non focal  Skin: Warm, no lesions or rashes    CT scan 03/11/15 --  IMPRESSION: 1. Reduced size of the right upper lobe mass. The measurement which is thought to be most suitable and replicable given the orientation of the lesion is the vertical thickness ; currently this is 7 mm compared to 1.3 cm previously. This indicates improvement in the primary lesion. 2. Persistent thoracic scratch that persistent mediastinal adenopathy, roughly similar to the prior exam. 3. New small bilateral pleural effusions with passive atelectasis. 4. Atherosclerosis.   Pleural effusion, right Certainly concerning for possible malignant effusion. He needs thoracentesis with chemistries and cytology. I will work on arranging this locally with interventional radiology. He will need to stop Eliquis for 2 days prior to the procedure. We'll forward the cytology to hematology oncology at [facility] so that they can act on the reslts if there is malignancy present.  Baltazar Apo, MD, PhD 10/23/2016, 11:44 AM [facility] [phone] or if no answer [phone]

## 2016-10-23 NOTE — Assessment & Plan Note (Addendum)
Cough: Slightly better? Likely due to pleural effusion Lung cancer: Recently seen with increased cough, chest x-ray was a stable, CT show a new right pleural effusion. He has an appointment to see pulmonology referral tomorrow. Results d/w pt and his wife in detail A. fib: Digoxin level is slightly low, digoxin dose increased, will check labs in 2-3 weeks. RTC 3-4 months. His routine checkup

## 2016-10-23 NOTE — Progress Notes (Signed)
Patient ID: Alejandro [name]., male   DOB: [birth year], 70 y.o.   MRN: [id number] Pt presented to [place] dept today for right thoracentesis. On limited US of post lung bases bilat there is only a trace amount of right pleural fluid and no left pleural fluid. Procedure was cancelled. Pt/ Dr. Agustina Caroli nurse notified.

## 2016-10-23 NOTE — Assessment & Plan Note (Signed)
Certainly concerning for possible malignant effusion. He needs thoracentesis with chemistries and cytology. I will work on arranging this locally with interventional radiology. He will need to stop Eliquis for 2 days prior to the procedure. We'll forward the cytology to hematology oncology at [facility] so that they can act on the reslts if there is malignancy present.

## 2016-10-23 NOTE — Telephone Encounter (Addendum)
Received a call from [place] at [facility]. States that there was no fluid to be drawn off of the pt. Reports that if there is any fluid there it's minimal and is unsafe to tap. Spoke with RB on the phone and made him aware of this information. Nothing further was needed.

## 2016-10-23 NOTE — Telephone Encounter (Signed)
Called and spoke [place] at [facility] [place] and was advised that they are able to still do the thoracentesis as long as the pt is on his way to the hospital now. Malachy Mood was informed by the pt that they were already on their way over to the hospital to have procedure. If pt cannot make appt then there is an opening on 11.22.17.   Called and left VM for pt.

## 2016-10-23 NOTE — Patient Instructions (Addendum)
We will arrange for interventional radiology to perform a right thoracentesis to drain pleural fluid.  We will forward the lab results to Oncology at [place] will need to stop Eliquis for 2 days prior to the procedure.  Follow with Dr [name] next available after the procedure

## 2016-10-23 NOTE — Telephone Encounter (Signed)
Wife called back, sitting in waiting area to see if the procedure will be done, advised patient to speak to front desk because the procedure can be done, wife spoke to front desk while I was still on the phone. Patient will be having procedure done today.Mearl Latin

## 2016-10-26 DIAGNOSIS — I2699 Other pulmonary embolism without acute cor pulmonale: Secondary | ICD-10-CM | POA: Diagnosis not present

## 2016-10-26 DIAGNOSIS — A481 Legionnaires' disease: Secondary | ICD-10-CM | POA: Diagnosis not present

## 2016-10-26 DIAGNOSIS — N289 Disorder of kidney and ureter, unspecified: Secondary | ICD-10-CM | POA: Diagnosis not present

## 2016-10-26 DIAGNOSIS — D6481 Anemia due to antineoplastic chemotherapy: Secondary | ICD-10-CM | POA: Diagnosis not present

## 2016-10-26 DIAGNOSIS — C3491 Malignant neoplasm of unspecified part of right bronchus or lung: Secondary | ICD-10-CM | POA: Diagnosis not present

## 2016-10-26 DIAGNOSIS — Z7901 Long term (current) use of anticoagulants: Secondary | ICD-10-CM | POA: Diagnosis not present

## 2016-10-26 DIAGNOSIS — I110 Hypertensive heart disease with heart failure: Secondary | ICD-10-CM | POA: Diagnosis not present

## 2016-10-26 DIAGNOSIS — J449 Chronic obstructive pulmonary disease, unspecified: Secondary | ICD-10-CM | POA: Diagnosis not present

## 2016-10-26 DIAGNOSIS — E7800 Pure hypercholesterolemia, unspecified: Secondary | ICD-10-CM | POA: Diagnosis not present

## 2016-10-26 DIAGNOSIS — I5032 Chronic diastolic (congestive) heart failure: Secondary | ICD-10-CM | POA: Diagnosis not present

## 2016-10-26 DIAGNOSIS — Z8673 Personal history of transient ischemic attack (TIA), and cerebral infarction without residual deficits: Secondary | ICD-10-CM | POA: Diagnosis not present

## 2016-10-26 DIAGNOSIS — C3411 Malignant neoplasm of upper lobe, right bronchus or lung: Secondary | ICD-10-CM | POA: Diagnosis not present

## 2016-10-26 DIAGNOSIS — Z87891 Personal history of nicotine dependence: Secondary | ICD-10-CM | POA: Diagnosis not present

## 2016-10-26 DIAGNOSIS — I482 Chronic atrial fibrillation: Secondary | ICD-10-CM | POA: Diagnosis not present

## 2016-10-26 DIAGNOSIS — F329 Major depressive disorder, single episode, unspecified: Secondary | ICD-10-CM | POA: Diagnosis not present

## 2016-10-26 DIAGNOSIS — K219 Gastro-esophageal reflux disease without esophagitis: Secondary | ICD-10-CM | POA: Diagnosis not present

## 2016-10-26 DIAGNOSIS — I2510 Atherosclerotic heart disease of native coronary artery without angina pectoris: Secondary | ICD-10-CM | POA: Diagnosis not present

## 2016-10-26 DIAGNOSIS — R490 Dysphonia: Secondary | ICD-10-CM | POA: Diagnosis not present

## 2016-11-12 ENCOUNTER — Other Ambulatory Visit: Payer: Medicare Other

## 2016-11-19 ENCOUNTER — Ambulatory Visit: Payer: Medicare Other | Admitting: Emergency Medicine

## 2016-11-21 DIAGNOSIS — Z923 Personal history of irradiation: Secondary | ICD-10-CM | POA: Diagnosis not present

## 2016-11-21 DIAGNOSIS — C3491 Malignant neoplasm of unspecified part of right bronchus or lung: Secondary | ICD-10-CM | POA: Diagnosis not present

## 2016-11-21 DIAGNOSIS — I2782 Chronic pulmonary embolism: Secondary | ICD-10-CM | POA: Diagnosis not present

## 2016-11-21 DIAGNOSIS — R590 Localized enlarged lymph nodes: Secondary | ICD-10-CM | POA: Diagnosis not present

## 2016-11-21 DIAGNOSIS — C3411 Malignant neoplasm of upper lobe, right bronchus or lung: Secondary | ICD-10-CM | POA: Diagnosis not present

## 2016-11-21 DIAGNOSIS — J701 Chronic and other pulmonary manifestations due to radiation: Secondary | ICD-10-CM | POA: Diagnosis not present

## 2016-11-21 DIAGNOSIS — N289 Disorder of kidney and ureter, unspecified: Secondary | ICD-10-CM | POA: Diagnosis not present

## 2016-11-21 DIAGNOSIS — J90 Pleural effusion, not elsewhere classified: Secondary | ICD-10-CM | POA: Diagnosis not present

## 2016-11-21 DIAGNOSIS — R490 Dysphonia: Secondary | ICD-10-CM | POA: Diagnosis not present

## 2016-11-21 DIAGNOSIS — I7789 Other specified disorders of arteries and arterioles: Secondary | ICD-10-CM | POA: Diagnosis not present

## 2016-11-22 DIAGNOSIS — Z79899 Other long term (current) drug therapy: Secondary | ICD-10-CM | POA: Diagnosis not present

## 2016-11-22 DIAGNOSIS — C3491 Malignant neoplasm of unspecified part of right bronchus or lung: Secondary | ICD-10-CM | POA: Diagnosis not present

## 2016-11-22 DIAGNOSIS — I2782 Chronic pulmonary embolism: Secondary | ICD-10-CM | POA: Diagnosis not present

## 2016-11-22 DIAGNOSIS — Z7901 Long term (current) use of anticoagulants: Secondary | ICD-10-CM | POA: Diagnosis not present

## 2016-12-14 ENCOUNTER — Other Ambulatory Visit: Payer: Self-pay | Admitting: Nurse Practitioner

## 2017-01-03 ENCOUNTER — Encounter: Payer: Self-pay | Admitting: Emergency Medicine

## 2017-01-03 ENCOUNTER — Ambulatory Visit (INDEPENDENT_AMBULATORY_CARE_PROVIDER_SITE_OTHER)
Admission: RE | Admit: 2017-01-03 | Discharge: 2017-01-03 | Disposition: A | Discharge status: Home / Self Care | Payer: Medicare Other | Source: Ambulatory Visit | Attending: Emergency Medicine | Admitting: Emergency Medicine

## 2017-01-03 ENCOUNTER — Ambulatory Visit (INDEPENDENT_AMBULATORY_CARE_PROVIDER_SITE_OTHER): Payer: Medicare Other | Admitting: Emergency Medicine

## 2017-01-03 VITALS — BP 116/78 | HR 95 | Ht 68.0 in | Wt 244.2 lb

## 2017-01-03 DIAGNOSIS — R05 Cough: Secondary | ICD-10-CM

## 2017-01-03 DIAGNOSIS — R059 Cough, unspecified: Secondary | ICD-10-CM

## 2017-01-03 DIAGNOSIS — J449 Chronic obstructive pulmonary disease, unspecified: Secondary | ICD-10-CM | POA: Diagnosis not present

## 2017-01-03 DIAGNOSIS — J209 Acute bronchitis, unspecified: Secondary | ICD-10-CM

## 2017-01-03 DIAGNOSIS — C3411 Malignant neoplasm of upper lobe, right bronchus or lung: Secondary | ICD-10-CM

## 2017-01-03 MED ORDER — TIOTROPIUM BROMIDE MONOHYDRATE 2.5 MCG/ACT IN AERS: 2.0000 | INHALATION_SPRAY | Freq: Every day | RESPIRATORY_TRACT | 5 refills | Status: DC

## 2017-01-03 MED ORDER — LEVOFLOXACIN 750 MG PO TABS: 750.0000 mg | ORAL_TABLET | Freq: Every day | ORAL | 0 refills | Status: AC

## 2017-01-03 NOTE — Assessment & Plan Note (Signed)
Depending on CXR result and clinical response to abx then may need to repeat his CT chest earlier than March. Will decide at next visit.

## 2017-01-03 NOTE — Progress Notes (Signed)
HPI: 71 yo man follows for RUL mass and mediastinal LAD. We performed EBUS, got good nodal tissue but no dx. He follows now to discuss next options. He had a repeat CT scan today. He is feeling well s/p our bronchoscopy.   ROV 03/22/15 -- 71 yo former smoker (40-50 pk-yrs) patient follows for squamous cell lung cancer. He's been treated by Dr. Julien Nordmann with concurrent chemoradiation, last dose was about 5 weeks ago. CT scan of the chest performed 03/11/15 shows a decrease in size of his right upper lobe mass and similar mediastinal lymphadenopathy.  He is to restart chemo at the end of this month.  He has a lot of fatigue, has dyspnea that his wife points out is severe. He is not on any BD.   ROV 04/21/15 -- follow-up visit for squamous cell lung cancer and history of tobacco use with suspected COPD. Last visit we started Spiriva to see if he will benefit - hard to say if has been helpful. He has had a raspy voice ever since his EBUS, actually even before when he had an NGT back a year ago. He has some nasal congestion. He is on loratadine daily.   ROV 10/23/16 -- patient is a former smoker with a history of squamous cell lung cancer which was treated with concurrent chemoradiation. Most recent cancer evaluation was 05/24/16 by H/O at [facility]. He also history of pulmonary embolism and DVT. He was seen by Dr Larose Kells this month for increased cough and SOB, more fatigue. CT scan chst was done 10/12/16 that I have personally reviewed, shows evidence for pulmonary emboli that were felt to be chronic, right upper lobe and middle lobe infiltrates, a new prevascular lymph node and a new moderate right pleural effusion. He is currently on Eliquis. He is using albuterol prn, rarely.   ROV 01/03/17 -- this follow-up visit for patient with a history of squamous cell lung cancer treated with chemoradiation, former tobacco and COPD. He also has a history of pulmonary embolism. CT 10/12/16 as above. He underwent thoracic  ultrasound on 10/23/16 to consider thoracentesis but that was not enough fluid to sample at that time. Currently has albuterol to use as needed. He's been using this approximately about once a day. He has had increased cough, happening continuously for the last 3 weeks. Brownish mucous. He is also SOB over the same time period. He hears some wheeze. No better with albuterol. ? Possibly a low grade fever. He is supposed to see oncology at [facility] in March.    Vitals:   01/03/17 1422  BP: 116/78  Pulse: 95  SpO2: 95%  Weight: 244 lb 3.2 oz (110.8 kg)  Height: '5\' 8"'$  (1.727 m)   Gen: Pleasant, well-nourished, in no distress,  normal affect  ENT: No lesions,  mouth clear,  oropharynx clear, no postnasal drip  Neck: No JVD, no TMG, no carotid bruits  Lungs: No use of accessory muscles, distant, clear without rales or rhonchi  Cardiovascular: RRR, heart sounds normal, no murmur or gallops, no peripheral edema  Musculoskeletal: No deformities, no cyanosis or clubbing  Neuro: alert, non focal  Skin: Warm, no lesions or rashes    CT scan 03/11/15 --  IMPRESSION: 1. Reduced size of the right upper lobe mass. The measurement which is thought to be most suitable and replicable given the orientation of the lesion is the vertical thickness ; currently this is 7 mm compared to 1.3 cm previously. This indicates improvement in the primary  lesion. 2. Persistent thoracic scratch that persistent mediastinal adenopathy, roughly similar to the prior exam. 3. New small bilateral pleural effusions with passive atelectasis. 4. Atherosclerosis.   COPD mixed type Not currently on BD's . I believe we should start Spiriva, see if he benefits.   Acute bronchitis Treat with levaquin x 7 days.  CXR today to look for infiltrate.   Non-small cell carcinoma of lung, stage 3 Depending on CXR result and clinical response to abx then may need to repeat his CT chest earlier than March. Will decide at next visit.    Baltazar Apo, MD, PhD 01/03/2017, 5:30 PM [place] [facility] [phone] or if no answer [phone]

## 2017-01-03 NOTE — Assessment & Plan Note (Signed)
Not currently on BD's . I believe we should start Spiriva, see if he benefits.

## 2017-01-03 NOTE — Patient Instructions (Signed)
Please take Levaquin 750 mg daily for 7 days.  CXR today.  Take albuterol 2 puffs up to every 4 hours if needed for shortness of breath.  We will start Spiriva respimat 2 puffs once a day until next visit to see if you benefit.  Repeat CT scan planned for March. We may decide to do this early depending on your CXR and how you are feeling.  Follow with Dr [name] in 1 month

## 2017-01-03 NOTE — Assessment & Plan Note (Signed)
Treat with levaquin x 7 days.  CXR today to look for infiltrate.

## 2017-01-29 DIAGNOSIS — I259 Chronic ischemic heart disease, unspecified: Secondary | ICD-10-CM | POA: Diagnosis not present

## 2017-01-29 DIAGNOSIS — E785 Hyperlipidemia, unspecified: Secondary | ICD-10-CM | POA: Diagnosis not present

## 2017-01-29 DIAGNOSIS — I119 Hypertensive heart disease without heart failure: Secondary | ICD-10-CM | POA: Diagnosis not present

## 2017-01-29 DIAGNOSIS — I10 Essential (primary) hypertension: Secondary | ICD-10-CM | POA: Diagnosis not present

## 2017-01-29 DIAGNOSIS — I4891 Unspecified atrial fibrillation: Secondary | ICD-10-CM | POA: Diagnosis not present

## 2017-01-29 DIAGNOSIS — Z8673 Personal history of transient ischemic attack (TIA), and cerebral infarction without residual deficits: Secondary | ICD-10-CM | POA: Diagnosis not present

## 2017-01-29 DIAGNOSIS — I2510 Atherosclerotic heart disease of native coronary artery without angina pectoris: Secondary | ICD-10-CM | POA: Diagnosis not present

## 2017-01-29 DIAGNOSIS — Z951 Presence of aortocoronary bypass graft: Secondary | ICD-10-CM | POA: Diagnosis not present

## 2017-02-01 ENCOUNTER — Encounter: Payer: Self-pay | Admitting: Emergency Medicine

## 2017-02-01 ENCOUNTER — Ambulatory Visit (INDEPENDENT_AMBULATORY_CARE_PROVIDER_SITE_OTHER): Payer: Medicare Other | Admitting: Emergency Medicine

## 2017-02-01 DIAGNOSIS — J301 Allergic rhinitis due to pollen: Secondary | ICD-10-CM | POA: Diagnosis not present

## 2017-02-01 DIAGNOSIS — J309 Allergic rhinitis, unspecified: Secondary | ICD-10-CM | POA: Insufficient documentation

## 2017-02-01 DIAGNOSIS — J449 Chronic obstructive pulmonary disease, unspecified: Secondary | ICD-10-CM | POA: Diagnosis not present

## 2017-02-01 DIAGNOSIS — J209 Acute bronchitis, unspecified: Secondary | ICD-10-CM

## 2017-02-01 DIAGNOSIS — C3411 Malignant neoplasm of upper lobe, right bronchus or lung: Secondary | ICD-10-CM | POA: Diagnosis not present

## 2017-02-01 MED ORDER — UMECLIDINIUM-VILANTEROL 62.5-25 MCG/INH IN AEPB: 1.0000 | INHALATION_SPRAY | Freq: Every day | RESPIRATORY_TRACT | 0 refills | Status: DC

## 2017-02-01 MED ORDER — FLUTICASONE PROPIONATE 50 MCG/ACT NA SUSP: 2.0000 | Freq: Every day | NASAL | 3 refills | Status: DC

## 2017-02-01 NOTE — Addendum Note (Signed)
Addended by: Tyson Dense on: 02/01/2017 12:35 PM   Modules accepted: Orders

## 2017-02-01 NOTE — Patient Instructions (Signed)
We will start fluticasone nasal spray, 2 sprays each side daily We will try starting Anoro, 1 inhalation daily Follow with Dr [name] in 2 months or sooner if you have any problems.

## 2017-02-01 NOTE — Assessment & Plan Note (Signed)
planning f/u in June 18 at [facility]. He tells me that he is considering changing back to [facility] for convenience.

## 2017-02-01 NOTE — Assessment & Plan Note (Signed)
He was unable to start [place] for insurance reasons. I will try getting him Anoro qd, samples if possible to see if he benefits.

## 2017-02-01 NOTE — Assessment & Plan Note (Signed)
Start fluticasone NS 2 sprays each side daily/

## 2017-02-01 NOTE — Progress Notes (Signed)
HPI: 71 yo man follows for RUL mass and mediastinal LAD. We performed EBUS, got good nodal tissue but no dx. He follows now to discuss next options. He had a repeat CT scan today. He is feeling well s/p our bronchoscopy.   ROV 03/22/15 -- 72 yo former smoker (40-50 pk-yrs) patient follows for squamous cell lung cancer. He's been treated by Dr. Julien Nordmann with concurrent chemoradiation, last dose was about 5 weeks ago. CT scan of the chest performed 03/11/15 shows a decrease in size of his right upper lobe mass and similar mediastinal lymphadenopathy.  He is to restart chemo at the end of this month.  He has a lot of fatigue, has dyspnea that his wife points out is severe. He is not on any BD.   ROV 04/21/15 -- follow-up visit for squamous cell lung cancer and history of tobacco use with suspected COPD. Last visit we started Spiriva to see if he will benefit - hard to say if has been helpful. He has had a raspy voice ever since his EBUS, actually even before when he had an NGT back a year ago. He has some nasal congestion. He is on loratadine daily.   ROV 10/23/16 -- patient is a former smoker with a history of squamous cell lung cancer which was treated with concurrent chemoradiation. Most recent cancer evaluation was 05/24/16 by H/O at [facility]. He also history of pulmonary embolism and DVT. He was seen by Dr Larose Kells this month for increased cough and SOB, more fatigue. CT scan chst was done 10/12/16 that I have personally reviewed, shows evidence for pulmonary emboli that were felt to be chronic, right upper lobe and middle lobe infiltrates, a new prevascular lymph node and a new moderate right pleural effusion. He is currently on Eliquis. He is using albuterol prn, rarely.   ROV 01/03/17 -- this follow-up visit for patient with a history of squamous cell lung cancer treated with chemoradiation, former tobacco and COPD. He also has a history of pulmonary embolism. CT 10/12/16 as above. He underwent thoracic  ultrasound on 10/23/16 to consider thoracentesis but that was not enough fluid to sample at that time. Currently has albuterol to use as needed. He's been using this approximately about once a day. He has had increased cough, happening continuously for the last 3 weeks. Brownish mucous. He is also SOB over the same time period. He hears some wheeze. No better with albuterol. ? Possibly a low grade fever. He is supposed to see oncology at [facility] in March.   ROV 02/01/17 -- this follow-up visit fofr COPD, squamous cell lung CA, PE in 11/17. At his last visit 1 month ago he was having cough productive of brown sputum, was treated w levaquin for possible acute bronchitis. CXR did not sow an infiltrate that day. We started Spiriva to see if he would benefit >> cough may have improved some on the abx but he never obtained and started the Spiriva. He is due to see Oncology at [facility] have a repeat scan in June 18. He c/o nasal congestion and UA cough (diferent from last visit's cough). Tan mucous   Vitals:   02/01/17 1102  BP: 140/80  Pulse: 70  SpO2: 97%  Weight: 238 lb (108 kg)  Height: '5\' 8"'$  (1.727 m)   Gen: Pleasant, well-nourished, in no distress,  normal affect  ENT: No lesions,  mouth clear,  oropharynx clear, no postnasal drip  Neck: No JVD, no TMG, no carotid bruits  Lungs: No use of accessory muscles, distant, clear without rales or rhonchi  Cardiovascular: RRR, heart sounds normal, no murmur or gallops, no peripheral edema  Musculoskeletal: No deformities, no cyanosis or clubbing  Neuro: alert, non focal  Skin: Warm, no lesions or rashes    CT scan 03/11/15 --  IMPRESSION: 1. Reduced size of the right upper lobe mass. The measurement which is thought to be most suitable and replicable given the orientation of the lesion is the vertical thickness ; currently this is 7 mm compared to 1.3 cm previously. This indicates improvement in the primary lesion. 2. Persistent thoracic  scratch that persistent mediastinal adenopathy, roughly similar to the prior exam. 3. New small bilateral pleural effusions with passive atelectasis. 4. Atherosclerosis.   COPD mixed type He was unable to start Spiriva respimat for insurance reasons. I will try getting him Anoro qd, samples if possible to see if he benefits.   Non-small cell carcinoma of lung, stage 3 planning f/u in June 18 at [facility]. He tells me that he is considering changing back to [facility] for convenience.   Acute bronchitis Appears to be resolved  Allergic rhinitis Start fluticasone NS 2 sprays each side daily/   Baltazar Apo, MD, PhD 02/01/2017, 11:30 AM [place] [phone] or if no answer [phone]

## 2017-02-01 NOTE — Progress Notes (Signed)
Patient seen in the office today and instructed on use of Anoro.  Patient expressed understanding and demonstrated technique. 

## 2017-02-01 NOTE — Assessment & Plan Note (Signed)
Appears to be resolved. 

## 2017-02-05 ENCOUNTER — Ambulatory Visit (INDEPENDENT_AMBULATORY_CARE_PROVIDER_SITE_OTHER): Payer: Medicare Other | Admitting: *Deleted

## 2017-02-05 ENCOUNTER — Encounter: Payer: Self-pay | Admitting: *Deleted

## 2017-02-05 ENCOUNTER — Ambulatory Visit: Payer: Medicare Other | Admitting: *Deleted

## 2017-02-05 VITALS — BP 138/66 | HR 75 | Ht 68.0 in | Wt 237.8 lb

## 2017-02-05 DIAGNOSIS — I5022 Chronic systolic (congestive) heart failure: Secondary | ICD-10-CM | POA: Diagnosis not present

## 2017-02-05 DIAGNOSIS — Z0000 Encounter for general adult medical examination without abnormal findings: Secondary | ICD-10-CM

## 2017-02-05 DIAGNOSIS — Z23 Encounter for immunization: Secondary | ICD-10-CM

## 2017-02-05 DIAGNOSIS — H547 Unspecified visual loss: Secondary | ICD-10-CM

## 2017-02-05 NOTE — Progress Notes (Signed)
Pre visit review using our clinic review tool, if applicable. No additional management support is needed unless otherwise documented below in the visit note. 

## 2017-02-05 NOTE — Progress Notes (Addendum)
Subjective:   Alejandro [name]. is a 70 y.o. male who presents for an Initial Medicare Annual Wellness Visit. He is accompanied today by his wife.  The Patient was informed that the wellness visit is to identify future health risk and educate and initiate measures that can reduce risk for increased disease through the lifespan.   Describes health as fair, good or great? "Fair."  Review of Systems  No ROS.  Medicare Wellness Visit.  Cardiac Risk Factors include: obesity (BMI >30kg/m2);sedentary lifestyle;male gender;hypertension;advanced age (>10mn, >>9women);dyslipidemia  Sleep patterns: no sleep issues, gets up 1-2 times nightly to void and sleeps 8 hours nightly. Frequent daytime napping.   Home Safety/Smoke Alarms: Feels safe in home. Smoke alarms in place.   Living environment; residence and Firearm Safety: Lives w/ wife. 1-story house/ trailer, ramped, tub-shower, equipment: Cane, Type: Single PGeneral Millsand HOmnicom Type: Tub SSurveyor, quantity Seat Belt Safety/Bike Helmet: Wears seat belt.   Counseling:   Eye Exam- Does not have eye doctor. Dental- Does not follow w/ dentist regularly. No dental issues reported.  Male:   CCS- Not on file. Patient is interested in Cologuard.    PSA- Not on file. Defer to PCP for screening recommendations.   Objective:    Today's Vitals   02/05/17 1153  BP: 138/66  Pulse: 75  SpO2: 98%  Weight: 237 lb 12.8 oz (107.9 kg)  Height: '5\' 8"'$  (1.727 m)   Body mass index is 36.16 kg/m.  BP Readings from Last 3 Encounters:  02/05/17 138/66  02/01/17 140/80  01/03/17 116/78    Current Medications (verified) Outpatient Encounter Prescriptions as of 02/05/2017  Medication Sig  . albuterol (PROVENTIL HFA;VENTOLIN HFA) 108 (90 Base) MCG/ACT inhaler Inhale 2 puffs into the lungs every 4 (four) hours as needed for wheezing or shortness of breath.  .Marland Kitchenaspirin 81 MG tablet Take 81 mg by mouth daily.  . digoxin (LANOXIN) 0.125 MG tablet Take 0.25  mg by mouth daily.  . fluticasone (FLONASE) 50 MCG/ACT nasal spray Place 2 sprays into both nostrils daily.  . furosemide (LASIX) 40 MG tablet Take 1 tablet (40 mg total) by mouth daily.  .Marland Kitchenlidocaine-prilocaine (EMLA) cream Apply 1 application topically as needed.  . metoprolol (LOPRESSOR) 50 MG tablet Take 1 tablet (50 mg total) by mouth 2 (two) times daily.  . Multiple Vitamin (MULTIVITAMIN WITH MINERALS) TABS tablet Take 1 tablet by mouth daily.  .Marland Kitchenumeclidinium-vilanterol (ANORO ELLIPTA) 62.5-25 MCG/INH AEPB Inhale 1 puff into the lungs daily.   No facility-administered encounter medications on file as of 02/05/2017.     Allergies (verified) Patient has no known allergies.   History: Past Medical History:  Diagnosis Date  . Alcohol abuse   . Atrial fibrillation ([place]   . CAD (coronary artery disease)    s/p CABGx4 on 12/24/2008  . Cancer ([place]    around nose.  New dx of lung cancer  . CHF (congestive heart failure) ([place]   . Dyslipidemia   . Dysrhythmia    atrial fib  . GERD (gastroesophageal reflux disease)   . H/O hiatal hernia   . HTN (hypertension)   . Myocardial infarction   . Non-small cell carcinoma of lung, stage 3 12/16/2014  . S/P chemotherapy, time since 4-12 weeks   . Shortness of breath    occ  . Stroke ([place] 5/15  . Systolic heart failure ([place]   . Tobacco abuse    Past Surgical History:  Procedure Laterality Date  .  APPLICATION OF A-CELL OF CHEST/ABDOMEN N/A 01/22/2013   Procedure: APPLICATION OF A-CELL OF CHEST/ABDOMEN;  Surgeon: [name], MD;  Location: [facility];  Service: General;  Laterality: N/A;  . CORONARY ARTERY BYPASS GRAFT  12/24/2008   4 vessel  . CORONARY ARTERY BYPASS GRAFT  2012?  . CRANIOTOMY Right 04/19/2014   Procedure: CRANIOTOMY HEMATOMA EVACUATION SUBDURAL;  Surgeon: [name], MD;  Location: [facility];  Service: Neurosurgery;  Laterality: Right;  right  . CRANIOTOMY Right 04/23/2014   Procedure: Craniotomy for Intracerebral  Hemorrhage;  Surgeon: [name], MD;  Location: [facility];  Service: Neurosurgery;  Laterality: Right;  . 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 CRANIOTOMY N/A 09/13/2014   Procedure: CRANIOTOMY BONE FLAP/PROSTHETIC PLATE;  Surgeon: [name], MD;  Location: [facility];  Service: Neurosurgery;  Laterality: N/A;  . CYSTOSCOPY N/A 01/22/2013   Procedure: CYSTOSCOPY;  Surgeon: [name], MD;  Location: [facility];  Service: Urology;  Laterality: N/A;  Cystoscopy with balloon dilation. Insertion of coude catheter.  Marland Kitchen HERNIA REPAIR  2012  . UMBILICAL HERNIA REPAIR  2010  . VENTRAL HERNIA REPAIR N/A 01/22/2013   Procedure: HERNIA REPAIR VENTRAL ADULT;  Surgeon: [name], MD;  Location: [facility];  Service: General;  Laterality: N/A;  . VIDEO BRONCHOSCOPY WITH ENDOBRONCHIAL ULTRASOUND N/A 11/09/2014   Procedure: VIDEO BRONCHOSCOPY WITH ENDOBRONCHIAL ULTRASOUND;  Surgeon: Collene Gobble, MD;  [facility];  Service: Thoracic;  Laterality: N/A;   Family History  Problem Relation Age of Onset  . Diabetes Mother   . Heart disease Mother   . Heart attack Mother   . Diabetes Father   . Heart disease Father   . Heart attack Father   . Rheum arthritis Daughter   . Heart disease Brother     s/p CABG at 47  . Heart attack Brother   . Colon cancer Neg Hx   . Prostate cancer Neg Hx   . Stroke Neg Hx    Social History   Occupational History  . [employer] - retired    Social History Main Topics  . Smoking status: Former Smoker    Packs/day: 1.00    Years: 50.00    Types: Cigarettes    Quit date: 04/19/2013  . Smokeless tobacco: Never Used  . Alcohol use 4.2 oz/week    7 Cans of beer per week  . Drug use: No  . Sexual activity: Not on file   Tobacco Counseling Counseling given: Not Answered   Activities of  Daily Living In your present state of health, do you have any difficulty performing the following activities: 02/05/2017 10/08/2016  Hearing? Y N  Vision? Y N  Difficulty concentrating or making decisions? N N  Walking or climbing stairs? N Y  Dressing or bathing? N N  Doing errands, shopping? Tempie Donning  Preparing Food and eating ? N -  Using the Toilet? N -  In the past six months, have you accidently leaked urine? N -  Do you have problems with loss of bowel control? N -  Managing your Medications? Y -  Managing your Finances? N -  Housekeeping or managing your Housekeeping? N -  Some recent data might be hidden    Immunizations and Health Maintenance Immunization History  Administered  Date(s) Administered  . Influenza, High Dose Seasonal PF 08/26/2015, 10/23/2016  . Influenza,inj,Quad PF,36+ Mos 07/27/2014  . Pneumococcal Conjugate-13 02/05/2017  . Pneumococcal Polysaccharide-23 04/20/2014  . Tetanus 05/24/2014   Health Maintenance Due  Topic Date Due  . Hepatitis C Screening  Dec 30, 1945    Patient Care Team: Colon Branch, MD as PCP - General (Internal Medicine) Collene Gobble, MD as Consulting Physician (Pulmonary Disease) Liliane Shi, PA-C as Physician Assistant (Cardiology) Jena Gauss, MD as Referring Physician (Hematology and Oncology)  Indicate any recent Medical Services you may have received from other than [facility] providers in the past year (date may be approximate).    Assessment:   This is a routine wellness examination for [place]. Physical assessment deferred to PCP.  Hearing/Vision screen  Hearing Screening   '125Hz'$  '250Hz'$  '500Hz'$  '1000Hz'$  '2000Hz'$  '3000Hz'$  '4000Hz'$  '6000Hz'$  '8000Hz'$   Right ear:   Fail Pass Pass  Fail    Left ear:   Fail Pass Pass  Fail    Comments: Able to hear conversational tones w/o difficulty. No issues reported. Fails whisper test.  Vision Screening Comments: Wears reading glasses.   Dietary issues and exercise activities discussed: Current  Exercise Habits: Home exercise routine, Type of exercise: strength training/weights, Exercise limited by: respiratory conditions(s)   Diet (meal preparation, eat out, water intake, caffeinated beverages, dairy products, fruits and vegetables): in general, an "unhealthy" diet, on average, 3 meals per day. Meals vary-likes hot dogs, soup, spaghetti. Eats most meals at home. Drinks coffee and soda throughout the day. Drinks some water.   Goals    . Increase physical activity      Depression Screen PHQ 2/9 Scores 02/05/2017 10/08/2016 08/26/2015 07/27/2014  PHQ - 2 Score 1 0 0 0    Fall Risk Fall Risk  02/05/2017 10/08/2016 08/26/2015 12/16/2014 07/27/2014  Falls in the past year? No No No Yes -  Number falls in past yr: - - - 1 -  Injury with Fall? - - - Yes -  Risk for fall due to : - - - History of fall(s) Impaired mobility  Risk for fall due to (comments): - - - - uses cane for stability, wears head gear for protection of skull    Cognitive Function: MMSE - Mini Mental State Exam 02/05/2017  Orientation to time 4  Orientation to Place 5  Registration 3  Attention/ Calculation 5  Recall 2  Language- name 2 objects 2  Language- repeat 1  Language- follow 3 step command 3  Language- read & follow direction 1  Write a sentence 1  Copy design 1  Total score 28        Screening Tests Health Maintenance  Topic Date Due  . Hepatitis C Screening  1946-08-23  . TETANUS/TDAP  05/24/2024  . INFLUENZA VACCINE  Completed  . PNA vac Low Risk Adult  Completed        Plan:    Follow-up w/ PCP as scheduled.   Discussed w/ PCP and placed referrals to optha and cards.  CCS to be discussed w/ PCP at next visit.   During the course of the visit Winner was educated and counseled about the following appropriate screening and preventive services:   Vaccines to include Pneumoccal, Influenza, Hepatitis B, Td, HCV  Colorectal cancer screening  Cardiovascular disease screening  Diabetes  screening  Glaucoma screening  Nutrition counseling  Prostate cancer screening  Patient Instructions (the written plan) were given to the patient.   Derenda Mis  Ermalinda Barrios, RN   02/05/2017    [name], MD

## 2017-02-05 NOTE — Patient Instructions (Signed)
[name] , Thank you for taking time to come for your Medicare Wellness Visit. I appreciate your ongoing commitment to your health goals. Please review the following plan we discussed and let me know if I can assist you in the future.   You got your Prevnar-13 (pneumococcal) vaccine today.  Bring a copy of your advance directives to your next office visit.  Pneumococcal Conjugate Vaccine (PCV13) What You Need to Know 1. Why get vaccinated? Vaccination can protect both children and adults from pneumococcal disease. Pneumococcal disease is caused by bacteria that can spread from person to person through close contact. It can cause ear infections, and it can also lead to more serious infections of the:  Lungs (pneumonia),  Blood (bacteremia), and  Covering of the brain and spinal cord (meningitis). Pneumococcal pneumonia is most common among adults. Pneumococcal meningitis can cause deafness and brain damage, and it kills about 1 child in 10 who get it. Anyone can get pneumococcal disease, but children under 24 years of age and adults 66 years and older, people with certain medical conditions, and cigarette smokers are at the highest risk. Before there was a vaccine, the [place] saw:  more than 700 cases of meningitis,  about 13,000 blood infections,  about 5 million ear infections, and  about 200 deaths in children under 5 each year from pneumococcal disease. Since vaccine became available, severe pneumococcal disease in these children has fallen by 88%. About 18,000 older adults die of pneumococcal disease each year in the [place]. Treatment of pneumococcal infections with penicillin and other drugs is not as effective as it used to be, because some strains of the disease have become resistant to these drugs. This makes prevention of the disease, through vaccination, even more important. 2. PCV13 vaccine Pneumococcal conjugate vaccine (called PCV13) protects against 13 types  of pneumococcal bacteria. PCV13 is routinely given to children at 2, 4, 6, and 29-1 months of age. It is also recommended for children and adults 73 to 50 years of age with certain health conditions, and for all adults 51 years of age and older. Your doctor can give you details. 3. Some people should not get this vaccine Anyone who has ever had a life-threatening allergic reaction to a dose of this vaccine, to an earlier pneumococcal vaccine called PCV7, or to any vaccine containing diphtheria toxoid (for example, DTaP), should not get PCV13. Anyone with a severe allergy to any component of PCV13 should not get the vaccine. Tell your doctor if the person being vaccinated has any severe allergies. If the person scheduled for vaccination is not feeling well, your healthcare provider might decide to reschedule the shot on another day. 4. Risks of a vaccine reaction With any medicine, including vaccines, there is a chance of reactions. These are usually mild and go away on their own, but serious reactions are also possible. Problems reported following PCV13 varied by age and dose in the series. The most common problems reported among children were:  About half became drowsy after the shot, had a temporary loss of appetite, or had redness or tenderness where the shot was given.  About 1 out of 3 had swelling where the shot was given.  About 1 out of 3 had a mild fever, and about 1 in 20 had a fever over 102.54F.  Up to about 8 out of 10 became fussy or irritable. Adults have reported pain, redness, and swelling where the shot was given; also mild fever, fatigue,  headache, chills, or muscle pain. Young children who get PCV13 along with inactivated flu vaccine at the same time may be at increased risk for seizures caused by fever. Ask your doctor for more information. Problems that could happen after any vaccine:   People sometimes faint after a medical procedure, including vaccination. Sitting or  lying down for about 15 minutes can help prevent fainting, and injuries caused by a fall. Tell your doctor if you feel dizzy, or have vision changes or ringing in the ears.  Some older children and adults get severe pain in the shoulder and have difficulty moving the arm where a shot was given. This happens very rarely.  Any medication can cause a severe allergic reaction. Such reactions from a vaccine are very rare, estimated at about 1 in a million doses, and would happen within a few minutes to a few hours after the vaccination. As with any medicine, there is a very small chance of a vaccine causing a serious injury or death. The safety of vaccines is always being monitored. For more information, visit: http://www.aguilar.org/ 5. What if there is a serious reaction? What should I look for?  Look for anything that concerns you, such as signs of a severe allergic reaction, very high fever, or unusual behavior. Signs of a severe allergic reaction can include hives, swelling of the face and throat, difficulty breathing, a fast heartbeat, dizziness, and weakness-usually within a few minutes to a few hours after the vaccination. What should I do?   If you think it is a severe allergic reaction or other emergency that can't wait, call 9-1-1 or get the person to the nearest hospital. Otherwise, call your doctor.  Reactions should be reported to the Vaccine Adverse Event Reporting System (VAERS). Your doctor should file this report, or you can do it yourself through the VAERS web site at www.vaers.SamedayNews.es, or by calling (206)307-0573.  VAERS does not give medical advice. 6. The National Vaccine Injury Compensation Program The [employer] Vaccine Injury Compensation Program (VICP) is a federal program that was created to compensate people who may have been injured by certain vaccines. Persons who believe they may have been injured by a vaccine can learn about the program and about filing a claim by  calling 819-389-8906 or visiting the [place] website at GoldCloset.com.ee. There is a time limit to file a claim for compensation. 7. How can I learn more?  Ask your healthcare provider. He or she can give you the vaccine package insert or suggest other sources of information.  Call your local or state health department.  Contact the Centers for Disease Control and Prevention (CDC):  Call 440-700-9961 (1-800-CDC-INFO) or  Visit CDC's website at http://hunter.com/ Vaccine Information Statement, PCV13 Vaccine (10/07/2014) This information is not intended to replace advice given to you by your health care provider. Make sure you discuss any questions you have with your health care provider. Document Released: 09/16/2006 Document Revised: 08/09/2016 Document Reviewed: 08/09/2016 Elsevier Interactive Patient Education  2017 [employer].

## 2017-02-13 ENCOUNTER — Other Ambulatory Visit: Payer: Self-pay

## 2017-02-13 MED ORDER — FUROSEMIDE 40 MG PO TABS
40.0000 mg | ORAL_TABLET | Freq: Every day | ORAL | 1 refills | Status: DC
Start: 2017-02-13 — End: 2017-03-12

## 2017-02-18 ENCOUNTER — Encounter: Payer: Self-pay | Admitting: Internal Medicine

## 2017-02-18 ENCOUNTER — Ambulatory Visit (INDEPENDENT_AMBULATORY_CARE_PROVIDER_SITE_OTHER): Payer: Medicare Other | Admitting: Internal Medicine

## 2017-02-18 VITALS — BP 128/78 | HR 111 | Temp 97.5°F | Resp 16 | Ht 68.0 in | Wt 237.4 lb

## 2017-02-18 DIAGNOSIS — R05 Cough: Secondary | ICD-10-CM

## 2017-02-18 DIAGNOSIS — I2510 Atherosclerotic heart disease of native coronary artery without angina pectoris: Secondary | ICD-10-CM | POA: Diagnosis not present

## 2017-02-18 DIAGNOSIS — I10 Essential (primary) hypertension: Secondary | ICD-10-CM | POA: Diagnosis not present

## 2017-02-18 DIAGNOSIS — R5383 Other fatigue: Secondary | ICD-10-CM | POA: Diagnosis not present

## 2017-02-18 DIAGNOSIS — R059 Cough, unspecified: Secondary | ICD-10-CM

## 2017-02-18 LAB — TSH: TSH: 2.77 u[IU]/mL (ref 0.35–4.50)

## 2017-02-18 LAB — BASIC METABOLIC PANEL
BUN: 17 mg/dL (ref 6–23)
CALCIUM: 9.8 mg/dL (ref 8.4–10.5)
CO2: 24 mEq/L (ref 19–32)
Chloride: 103 mEq/L (ref 96–112)
Creatinine, Ser: 1.25 mg/dL (ref 0.40–1.50)
GFR: 60.41 mL/min (ref >60.00)
GLUCOSE: 151 mg/dL — AB (ref 70–99)
POTASSIUM: 4.1 meq/L (ref 3.5–5.1)
SODIUM: 135 meq/L (ref 135–145)

## 2017-02-18 LAB — FOLATE: Folate: >23.5 ng/mL (ref >5.9)

## 2017-02-18 LAB — VITAMIN B12: Vitamin B-12: 721 pg/mL (ref 211–911)

## 2017-02-18 MED ORDER — HYDROCODONE-HOMATROPINE 5-1.5 MG/5ML PO SYRP: 5.0000 mL | ORAL_SOLUTION | Freq: Two times a day (BID) | ORAL | 0 refills | Status: DC | PRN

## 2017-02-18 NOTE — Progress Notes (Signed)
Subjective:    Patient ID: Alejandro [name]., male    DOB: 09-Jan-1946, 71 y.o.   MRN: [id number]  DOS:  02/18/2017 Type of visit - description : rov Interval history: Here with his wife, his main concern continue to be lack of energy, cough. He is really distressed by the cough, is on and off, he has seen pulmonary and was given samples of anoro but like other inhalers he has not been able to use it. In his opinion, the cough seems to be generating from the throat. Wonders if a ENT referral is appropriate. Lack of energy is at baseline.  Review of Systems Denies dysphagia, odynophagia. No classic heartburn. He reports wheezing on and off and again is intolerant to most inhalers. No chest pain, no lower extremity edema.  Past Medical History:  Diagnosis Date  . Alcohol abuse   . Atrial fibrillation ([place])   . CAD (coronary artery disease)    s/p CABGx4 on 12/24/2008  . Cancer ([place])    around nose.  New dx of lung cancer  . CHF (congestive heart failure) ([place])   . Dyslipidemia   . Dysrhythmia    atrial fib  . GERD (gastroesophageal reflux disease)   . H/O hiatal hernia   . HTN (hypertension)   . Myocardial infarction   . Non-small cell carcinoma of lung, stage 3 12/16/2014  . S/P chemotherapy, time since 4-12 weeks   . Shortness of breath    occ  . Stroke ([place]) 5/15  . Systolic heart failure ([place])   . Tobacco abuse     Past Surgical History:  Procedure Laterality Date  . APPLICATION OF A-CELL OF CHEST/ABDOMEN N/A 01/22/2013   Procedure: APPLICATION OF A-CELL OF CHEST/ABDOMEN;  Surgeon: [name], MD;  Location: [facility];  Service: General;  Laterality: N/A;  . CORONARY ARTERY BYPASS GRAFT  12/24/2008   4 vessel  . CORONARY ARTERY BYPASS GRAFT  2012?  . CRANIOTOMY Right 04/19/2014   Procedure: CRANIOTOMY HEMATOMA EVACUATION SUBDURAL;  Surgeon: [name], MD;  Location: [facility];  Service: Neurosurgery;  Laterality: Right;  right  . CRANIOTOMY Right 04/23/2014   Procedure: Craniotomy for Intracerebral Hemorrhage;  Surgeon: [name], MD;  Location: [facility];  Service: Neurosurgery;  Laterality: Right;  . 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 CRANIOTOMY N/A 09/13/2014   Procedure: CRANIOTOMY BONE FLAP/PROSTHETIC PLATE;  Surgeon: [name], MD;  Location: [facility];  Service: Neurosurgery;  Laterality: N/A;  . CYSTOSCOPY N/A 01/22/2013   Procedure: CYSTOSCOPY;  Surgeon: [name], MD;  Location: [facility];  Service: Urology;  Laterality: N/A;  Cystoscopy with balloon dilation. Insertion of coude catheter.  Marland Kitchen HERNIA REPAIR  2012  . UMBILICAL HERNIA REPAIR  2010  . VENTRAL HERNIA REPAIR N/A 01/22/2013   Procedure: HERNIA REPAIR VENTRAL ADULT;  Surgeon: [name], MD;  Location: [facility];  Service: General;  Laterality: N/A;  . VIDEO BRONCHOSCOPY WITH ENDOBRONCHIAL ULTRASOUND N/A 11/09/2014   Procedure: VIDEO BRONCHOSCOPY WITH ENDOBRONCHIAL ULTRASOUND;  Surgeon: Collene Gobble, MD;  Location: [facility];  Service: Thoracic;  Laterality: N/A;    Social History   Social History  . Marital status: Married    Spouse name: N/A  . Number of children: 2  . Years of education: N/A  Occupational History  . [employer] - retired    Social History Main Topics  . Smoking status: Former Smoker    Packs/day: 1.00    Years: 50.00    Types: Cigarettes    Quit date: 04/19/2013  . Smokeless tobacco: Never Used  . Alcohol use 4.2 oz/week    7 Cans of beer per week  . Drug use: No  . Sexual activity: Not on file   Other Topics Concern  . Not on file   Social History Narrative   Lives in [place], [place] with wife.  Ambulates with a cane      Allergies as of 02/18/2017   No Known Allergies     Medication List       Accurate as of 02/18/17  7:03 PM. Always use your most  recent med list.          albuterol 108 (90 Base) MCG/ACT inhaler Commonly known as:  PROVENTIL HFA;VENTOLIN HFA Inhale 2 puffs into the lungs every 4 (four) hours as needed for wheezing or shortness of breath.   aspirin 81 MG tablet Take 81 mg by mouth daily.   digoxin 0.125 MG tablet Commonly known as:  LANOXIN Take 0.25 mg by mouth daily.   fluticasone 50 MCG/ACT nasal spray Commonly known as:  FLONASE Place 2 sprays into both nostrils daily.   furosemide 40 MG tablet Commonly known as:  LASIX Take 1 tablet (40 mg total) by mouth daily.   HYDROcodone-homatropine 5-1.5 MG/5ML syrup Commonly known as:  HYCODAN Take 5 mLs by mouth 2 (two) times daily as needed for cough.   lidocaine-prilocaine cream Commonly known as:  EMLA Apply 1 application topically as needed.   metoprolol 50 MG tablet Commonly known as:  LOPRESSOR Take 1 tablet (50 mg total) by mouth 2 (two) times daily.   multivitamin with minerals Tabs tablet Take 1 tablet by mouth daily.   umeclidinium-vilanterol 62.5-25 MCG/INH Aepb Commonly known as:  ANORO ELLIPTA Inhale 1 puff into the lungs daily.          Objective:   Physical Exam BP 128/78 (BP Location: Left Arm, Patient Position: Sitting, Cuff Size: Normal)   Pulse (!) 111   Temp 97.5 F (36.4 C) (Oral)   Resp 16   Ht '5\' 8"'$  (1.727 m)   Wt 237 lb 6 oz (107.7 kg)   SpO2 93%   BMI 36.09 kg/m  General:   Well developed, well nourished . NAD.  HEENT:  Normocephalic . Face symmetric, atraumatic. Throat symmetric Neck: Symmetric, no obvious mass or LAD on palpation. Lungs:  decrease breath sounds, few rhonchi Normal respiratory effort, no intercostal retractions, no accessory muscle use. Heart: History of A. fib, seems regular today.Marland Kitchen  No pretibial edema bilaterally  Skin: Not pale. Not jaundice Neurologic:  alert & oriented X3.  Speech normal, walks with difficulty, needs assistance transferring, assisted by a cane Psych--    Cognition and judgment appear intact.  Cooperative with normal attention span and concentration.  Behavior appropriate. No anxious or depressed appearing.      Assessment & Plan:   Assessment >  h/o prediabetes  HTN Dyslipidemia Cardiovascular:  Dr Quentin Cornwall --CAD, history of MI, CABG 2010 --Atrial fibrillation -- no coumadin candidate since 04-2014 --CHF H/o SDH,   F/u by intraparenchymal bleed and infection >>>>craniotomy 04/19/2014, 04/23/2014, 06/16/2014, 09/13/2014 On KEPPRA  Non-small cell lung cancer  Dx 12-2014, transfer [facility] to [facility] ~ 06-2015, also seen at pulmonary- [place] 10-2016 PE: per  CT ([facility]) 2-20-017, seen again in [place] 02-16-2016 GERD History of tobacco, EtOH Skin cancer, nose  PLAN: Cough:   continue to be his main concern, has seen pulmonary, unable to take inhalers. The cough is really disrupting his life. Told patient that he definitely has a # of reasons to have chronic cough including lung cancer and COPD. We agreed that we will try hydrocodone as a symptomatic treatment, drowsiness discussed. Will also refer him to ENT just to be sure no we're not missing a ENT etiology for his chronic cough. CAD: Seen by cardiology 01/29/2017, stable, RTC one year Fatigue: Ongoing problem for years, will recheck TSH, Z66 and folic acid but otherwise likely multifactorial issue RTC 4 months

## 2017-02-18 NOTE — Assessment & Plan Note (Signed)
Cough:   continue to be his main concern, has seen pulmonary, unable to take inhalers. The cough is really disrupting his life. Told patient that he definitely has a # of reasons to have chronic cough including lung cancer and COPD. We agreed that we will try hydrocodone as a symptomatic treatment, drowsiness discussed. Will also refer him to ENT just to be sure no we're not missing a ENT etiology for his chronic cough. CAD: Seen by cardiology 01/29/2017, stable, RTC one year Fatigue: Ongoing problem for years, will recheck TSH, X44 and folic acid but otherwise likely multifactorial issue RTC 4 months

## 2017-02-18 NOTE — Progress Notes (Signed)
Pre visit review using our clinic review tool, if applicable. No additional management support is needed unless otherwise documented below in the visit note. 

## 2017-02-18 NOTE — Patient Instructions (Addendum)
GO TO THE LAB : Get the blood work     GO TO THE FRONT DESK Schedule your next appointment for a  In 4 months  Take hydrocodone twice a day as needed for cough spells, will cause drowsiness.  We are referring you to ENT doctor

## 2017-02-19 ENCOUNTER — Ambulatory Visit: Payer: Medicare Other | Admitting: Internal Medicine

## 2017-02-27 DIAGNOSIS — J343 Hypertrophy of nasal turbinates: Secondary | ICD-10-CM | POA: Diagnosis not present

## 2017-02-27 DIAGNOSIS — J449 Chronic obstructive pulmonary disease, unspecified: Secondary | ICD-10-CM | POA: Diagnosis not present

## 2017-02-27 DIAGNOSIS — R05 Cough: Secondary | ICD-10-CM | POA: Diagnosis not present

## 2017-02-27 DIAGNOSIS — R490 Dysphonia: Secondary | ICD-10-CM | POA: Diagnosis not present

## 2017-03-08 NOTE — Progress Notes (Signed)
Cardiology Office Note    Date:  03/11/2017   ID:  Trimaine Maser., DOB Jan 14, 1946, MRN [id number]  PCP:  [name], MD  Cardiologist:  Dr. [name] (currently followed at [facility])   Chief Complaint: Establish care for CAD  History of Present Illness:   Amandeep Nesmith. is a 71 y.o. male with a historyh of ASCAD s/p remote CABG x 4 12/2008 (LIMA-LAD, SVG-diagonal, SVG-OM, SVG-PDA), hyperlipidemia, chronic systolic CHF (EF of 24-40% on last echo 04/2015), HTN, stage 3 non-small cell CA of lung s/p chemo, COPD, permanent atrial fibrillation not on anticoagulation 2/2 chemo induced pancytopenia, traumatic SHD s/p craniotomy presents for follow up.   The patient had a mechanical fall in May and developed a subdural hematoma requiring surgical evacuation. He subsequently had 4 surgeries in total. His subdural hematoma occur on a background of chronic warfarin therapy. He was deemed to no longer be a candidate for anticoagulation in the future  Last seen by Dr. Radford Pax during 05/2015 admission. At that time he was admitted for community-acquired pneumonia and questionable seizures. His elevated troponin felt demand ischemia in setting of pneumonia. No workup done.  Since then patient has been followed at [facility] by Dr. Quentin Cornwall. Last seen 01/29/2017. He was doing well on cardiac stand point.  Here today to establish care. He has access and parking issue at [employer]. He has noted exertional dyspnea and cough for the past 2 months now and has been getting worse. He has gained 4 pounds with lower extremity edema in past one week. He is eating extra salt in his diet. Not weighing himself every day. He is not taking that statin for unknown reason. Unknown last lipid check. He denies any orthopnea. Does complains of abdominal tightness and early ascites. UnAble to exercise to chronic knee issue and shortness of breath. He is only able to walk from house to [place]. Does complains of intermittent  palpitation lasting for few minutes without any symptoms. This occurs once every week. This symptoms is different from CABG.    Past Medical History:  Diagnosis Date  . Alcohol abuse   . Atrial fibrillation ([place])   . CAD (coronary artery disease)    s/p CABGx4 on 12/24/2008  . Cancer ([place])    around nose.  New dx of lung cancer  . CHF (congestive heart failure) ([place])   . Dyslipidemia   . Dysrhythmia    atrial fib  . GERD (gastroesophageal reflux disease)   . H/O hiatal hernia   . HTN (hypertension)   . Myocardial infarction   . Non-small cell carcinoma of lung, stage 3 12/16/2014  . S/P chemotherapy, time since 4-12 weeks   . Shortness of breath    occ  . Stroke ([place]) 5/15  . Systolic heart failure ([place])   . Tobacco abuse     Past Surgical History:  Procedure Laterality Date  . APPLICATION OF A-CELL OF CHEST/ABDOMEN N/A 01/22/2013   Procedure: APPLICATION OF A-CELL OF CHEST/ABDOMEN;  Surgeon: [name], MD;  Location: [place];  Service: General;  Laterality: N/A;  . CORONARY ARTERY BYPASS GRAFT  12/24/2008   4 vessel  . CORONARY ARTERY BYPASS GRAFT  2012?  . CRANIOTOMY Right 04/19/2014   Procedure: CRANIOTOMY HEMATOMA EVACUATION SUBDURAL;  Surgeon: [name], MD;  Location: [place] [facility];  Service: Neurosurgery;  Laterality: Right;  right  . CRANIOTOMY Right 04/23/2014   Procedure: Craniotomy for Intracerebral Hemorrhage;  Surgeon: Julien Girt  Saintclair Halsted, MD;  Location: [place] [facility];  Service: Neurosurgery;  Laterality: Right;  . CRANIOTOMY N/A 06/16/2014   Procedure: Craniotomy for intracerebral abscess and subdural empyema;  Surgeon: [name], MD;  Location: [place] [facility];  Service: Neurosurgery;  Laterality: N/A;  . CRANIOTOMY N/A 09/13/2014   Procedure: CRANIOTOMY BONE FLAP/PROSTHETIC PLATE;  Surgeon: [name], MD;  Location: [place] [facility];  Service: Neurosurgery;  Laterality: N/A;  . CYSTOSCOPY N/A 01/22/2013   Procedure: CYSTOSCOPY;  Surgeon: [name], MD;  Location: [place];  Service: Urology;  Laterality: N/A;  Cystoscopy with balloon dilation. Insertion of coude catheter.  Marland Kitchen HERNIA REPAIR  2012  . UMBILICAL HERNIA REPAIR  2010  . VENTRAL HERNIA REPAIR N/A 01/22/2013   Procedure: HERNIA REPAIR VENTRAL ADULT;  Surgeon: [name], MD;  Location: [place];  Service: General;  Laterality: N/A;  . VIDEO BRONCHOSCOPY WITH ENDOBRONCHIAL ULTRASOUND N/A 11/09/2014   Procedure: VIDEO BRONCHOSCOPY WITH ENDOBRONCHIAL ULTRASOUND;  Surgeon: Collene Gobble, MD;  Location: [facility];  Service: Thoracic;  Laterality: N/A;    Current Medications: Prior to Admission medications   Medication Sig Start Date End Date Taking? Authorizing Provider  albuterol (PROVENTIL HFA;VENTOLIN HFA) 108 (90 Base) MCG/ACT inhaler Inhale 2 puffs into the lungs every 4 (four) hours as needed for wheezing or shortness of breath. 05/08/16   Colon Branch, MD  aspirin 81 MG tablet Take 81 mg by mouth daily.    Historical Provider, MD  digoxin (LANOXIN) 0.125 MG tablet Take 0.25 mg by mouth daily.    Historical Provider, MD  fluticasone (FLONASE) 50 MCG/ACT nasal spray Place 2 sprays into both nostrils daily. 02/01/17   Collene Gobble, MD  furosemide (LASIX) 40 MG tablet Take 1 tablet (40 mg total) by mouth daily. 02/13/17   Colon Branch, MD  HYDROcodone-homatropine [facility]) 5-1.5 MG/5ML syrup Take 5 mLs by mouth 2 (two) times daily as needed for cough. 02/18/17   Colon Branch, MD  lidocaine-prilocaine (EMLA) cream Apply 1 application topically as needed. 03/28/15   Maryanna Shape, NP  metoprolol (LOPRESSOR) 50 MG tablet Take 1 tablet (50 mg total) by mouth 2 (two) times daily. 05/08/16   Colon Branch, MD  Multiple Vitamin (MULTIVITAMIN WITH MINERALS) TABS tablet Take 1 tablet by mouth daily. 05/14/14   Daniel J Angiulli, PA-C  umeclidinium-vilanterol (ANORO ELLIPTA) 62.5-25 MCG/INH AEPB Inhale 1 puff into the lungs daily. Patient not taking: Reported on 02/18/2017 02/01/17   Collene Gobble, MD    Allergies:   Patient has  no known allergies.   Social History   Social History  . Marital status: Married    Spouse name: N/A  . Number of children: 2  . Years of education: N/A   Occupational History  . [employer] - retired    Social History Main Topics  . Smoking status: Former Smoker    Packs/day: 1.00    Years: 50.00    Types: Cigarettes    Quit date: 04/19/2013  . Smokeless tobacco: Never Used  . Alcohol use 4.2 oz/week    7 Cans of beer per week  . Drug use: No  . Sexual activity: Not Asked   Other Topics Concern  . None   Social History Narrative   Lives in [place], [place] with wife.  Ambulates with a cane     Family History:  The patient's family history includes Diabetes in his father and mother; Heart  attack in his brother, father, and mother; Heart disease in his brother, father, and mother; Rheum arthritis in his daughter.   ROS:   Please see the history of present illness.    ROS All other systems reviewed and are negative.   PHYSICAL EXAM:   VS:  BP 138/82   Pulse 91   Ht '5\' 8"'$  (1.727 m)   Wt 238 lb 12.8 oz (108.3 kg)   BMI 36.31 kg/m    GEN: Well nourished, well developed, in no acute distress  HEENT: normal  Neck: no carotid bruits, or masses/ + JVD Cardiac: Ir IR ; no murmurs, rubs, or gallops, Trace to 1+ edema  Respiratory:  clear to auscultation bilaterally, normal work of breathing GI: soft, nontender, distended, + BS MS: no deformity or atrophy  Skin: warm and dry, no rash Neuro:  Alert and Oriented x 3, Strength and sensation are intact Psych: euthymic mood, full affect  Wt Readings from Last 3 Encounters:  03/11/17 238 lb 12.8 oz (108.3 kg)  02/18/17 237 lb 6 oz (107.7 kg)  02/05/17 237 lb 12.8 oz (107.9 kg)      Studies/Labs Reviewed:   EKG:  EKG is ordered today.  The ekg ordered today demonstrates afib at rate of 91 bpm, Chronic LBBB.   Recent Labs: 10/08/2016: ALT 26; Hemoglobin 13.8; Platelets 191.0 02/18/2017: BUN 17;  Creatinine, Ser 1.25; Potassium 4.1; Sodium 135; TSH 2.77   Lipid Panel    Component Value Date/Time   CHOL 144 05/24/2015 0630   TRIG 221 (H) 05/24/2015 0630   HDL 16 (L) 05/24/2015 0630   CHOLHDL 9.0 05/24/2015 0630   VLDL 44 (H) 05/24/2015 0630   LDLCALC 84 05/24/2015 0630    Additional studies/ records that were reviewed today include:   Echocardiogram: 04/2015 Study Conclusions  - Left ventricle: The cavity size was normal. Wall thickness was   normal. Systolic function was moderately to severely reduced. The   estimated ejection fraction was in the range of 30% to 35%. - Aortic valve: There was trivial regurgitation. - Mitral valve: There was mild regurgitation. - Left atrium: The atrium was severely dilated. - Right atrium: The atrium was moderately dilated.   LHC (12/2008): Left main 95% LAD 90% circumflex marginal 70% EF 25-30%. => CABG   Nuclear Study 04/05/15 High risk study with large sized, severe intensity, fixed defect involving the entire inferior wall and apex consistent with prior infarct. No ischemic noted. Severe LV dysfunction with EF 24%.   ASSESSMENT & PLAN:    1. Acute on chronic systolic CHF - EF of 92-33% on last echo 04/2015. Gained 4 lb in past 1 weeks. Noted worsening dyspnea for the past 2 weeks that has been getting worse. Overloaded on exam. Concerning for right-sided heart failure. Was to cut back on salt. Check CMP and BNP,. Update echo. Medication up titration pending labs. Not taking any supplemental K. Currently takes lasix '40mg'$  qd.   *Weigh yourself on the same scale at same time of day and keep a log. *Report weight gain of > 2 lbs in 1 day or 5 lbs over the course of a week and/or symptoms of excess fluid (shortness of breath, difficulty lying flat, swelling, poor appetite, abdominal fullness/bloating, etc) to your doctor immediately. *Avoid foods that are high in sodium (processed, pre-packaged/canned goods, fast foods,  etc). *Please attend all scheduled and reccommended follow up appointments   - His cough and dyspnea could be due to COPD and lung cancer.  Followed by Dr. [name]   2. CAD s/p CABG x4  in 2010 - No ischemia noted on last stress test 04/2015. No chest pain. Current symptoms is different when he had CABG. Continue ASA.   3. Permanent atrial fibrillation - Not on anticoagulation due to history of chemo induced pancytopenia and subdural hematoma. Intermittent palpitation. No syncope or dizziness. Increase metoprolol to '75mg'$  qd.   4. Hypertension - BP of 138/82 today. Increased metoprolol as above.   5. Hyperlipidemia - No results found for requested labs within last 8760 hours. will get lipid panel. CMP today. Start lipitor '40mg'$  qd.       Medication Adjustments/Labs and Tests Ordered: Current medicines are reviewed at length with the patient today.  Concerns regarding medicines are outlined above.  Medication changes, Labs and Tests ordered today are listed in the Patient Instructions below. Patient Instructions  Medication Instructions:  Your physician has recommended you make the following change in your medication:  1.  START Atorvastatin 40 mg taking 1 tablet daily 2.  INCREASE the Metoprolol to 75 taking 1 tablet twice a day   Labwork: TODAY: CMP & PRO BNP AT NEXT APPT:  FASTING LIPID   Testing/Procedures: Your physician has requested that you have an echocardiogram. Echocardiography is a painless test that uses sound waves to create images of your heart. It provides your doctor with information about the size and shape of your heart and how well your heart's chambers and valves are working. This procedure takes approximately one hour. There are no restrictions for this procedure.  Follow-Up: Your physician recommends that you schedule a follow-up appointment in: 2-3 WEEKS WITH VIN Latoia Eyster, PA-C, OR SOMEONE ON DR. [place] DR. [name] IS HERE   Any Other  Special Instructions Will Be Listed Below (If Applicable). Echocardiogram An echocardiogram, or echocardiography, uses sound waves (ultrasound) to produce an image of your heart. The echocardiogram is simple, painless, obtained within a short period of time, and offers valuable information to your health care provider. The images from an echocardiogram can provide information such as:  Evidence of coronary artery disease (CAD).  Heart size.  Heart muscle function.  Heart valve function.  Aneurysm detection.  Evidence of a past heart attack.  Fluid buildup around the heart.  Heart muscle thickening.  Assess heart valve function. Tell a health care provider about:  Any allergies you have.  All medicines you are taking, including vitamins, herbs, eye drops, creams, and over-the-counter medicines.  Any problems you or family members have had with anesthetic medicines.  Any blood disorders you have.  Any surgeries you have had.  Any medical conditions you have.  Whether you are pregnant or may be pregnant. What happens before the procedure? No special preparation is needed. Eat and drink normally. What happens during the procedure?  In order to produce an image of your heart, gel will be applied to your chest and a wand-like tool (transducer) will be moved over your chest. The gel will help transmit the sound waves from the transducer. The sound waves will harmlessly bounce off your heart to allow the heart images to be captured in real-time motion. These images will then be recorded.  You may need an IV to receive a medicine that improves the quality of the pictures. What happens after the procedure? You may return to your normal schedule including diet, activities, and medicines, unless your health care provider tells you otherwise. This information is not intended to  replace advice given to you by your health care provider. Make sure you discuss any questions you have with  your health care provider. Document Released: 11/16/2000 Document Revised: 07/07/2016 Document Reviewed: 07/27/2013 Elsevier Interactive Patient Education  2017 [employer].    If you need a refill on your cardiac medications before your next appointment, please call your pharmacy.      Jarrett Soho, [place]  03/11/2017 10:49 AM    [facility] [place], [place], [place]  [address] Phone: [phone]; Fax: [phone]

## 2017-03-11 ENCOUNTER — Encounter: Payer: Self-pay | Admitting: Physician Assistant

## 2017-03-11 ENCOUNTER — Ambulatory Visit (INDEPENDENT_AMBULATORY_CARE_PROVIDER_SITE_OTHER): Payer: Medicare Other | Admitting: Physician Assistant

## 2017-03-11 VITALS — BP 138/82 | HR 91 | Ht 68.0 in | Wt 238.8 lb

## 2017-03-11 DIAGNOSIS — I4821 Permanent atrial fibrillation: Secondary | ICD-10-CM

## 2017-03-11 DIAGNOSIS — E785 Hyperlipidemia, unspecified: Secondary | ICD-10-CM | POA: Diagnosis not present

## 2017-03-11 DIAGNOSIS — I10 Essential (primary) hypertension: Secondary | ICD-10-CM

## 2017-03-11 DIAGNOSIS — I482 Chronic atrial fibrillation, unspecified: Secondary | ICD-10-CM

## 2017-03-11 DIAGNOSIS — R600 Localized edema: Secondary | ICD-10-CM

## 2017-03-11 DIAGNOSIS — I25810 Atherosclerosis of coronary artery bypass graft(s) without angina pectoris: Secondary | ICD-10-CM

## 2017-03-11 DIAGNOSIS — I5023 Acute on chronic systolic (congestive) heart failure: Secondary | ICD-10-CM

## 2017-03-11 DIAGNOSIS — I2510 Atherosclerotic heart disease of native coronary artery without angina pectoris: Secondary | ICD-10-CM

## 2017-03-11 DIAGNOSIS — I5022 Chronic systolic (congestive) heart failure: Secondary | ICD-10-CM

## 2017-03-11 DIAGNOSIS — R0602 Shortness of breath: Secondary | ICD-10-CM

## 2017-03-11 MED ORDER — ATORVASTATIN CALCIUM 40 MG PO TABS: 40.0000 mg | ORAL_TABLET | Freq: Every day | ORAL | 3 refills | Status: DC

## 2017-03-11 MED ORDER — METOPROLOL TARTRATE 75 MG PO TABS: 75.0000 mg | ORAL_TABLET | Freq: Two times a day (BID) | ORAL | 1 refills | Status: DC

## 2017-03-11 NOTE — Patient Instructions (Addendum)
Medication Instructions:  Your physician has recommended you make the following change in your medication:  1.  START Atorvastatin 40 mg taking 1 tablet daily 2.  INCREASE the Metoprolol to 75 taking 1 tablet twice a day   Labwork: TODAY: CMP & PRO BNP AT NEXT APPT:  FASTING LIPID   Testing/Procedures: Your physician has requested that you have an echocardiogram. Echocardiography is a painless test that uses sound waves to create images of your heart. It provides your doctor with information about the size and shape of your heart and how well your heart's chambers and valves are working. This procedure takes approximately one hour. There are no restrictions for this procedure.  Follow-Up: Your physician recommends that you schedule a follow-up appointment in: 2-3 WEEKS WITH VIN BHAGAT, PA-C, OR SOMEONE ON DR. [place] DR. [name] IS HERE   Any Other Special Instructions Will Be Listed Below (If Applicable). Echocardiogram An echocardiogram, or echocardiography, uses sound waves (ultrasound) to produce an image of your heart. The echocardiogram is simple, painless, obtained within a short period of time, and offers valuable information to your health care provider. The images from an echocardiogram can provide information such as:  Evidence of coronary artery disease (CAD).  Heart size.  Heart muscle function.  Heart valve function.  Aneurysm detection.  Evidence of a past heart attack.  Fluid buildup around the heart.  Heart muscle thickening.  Assess heart valve function. Tell a health care provider about:  Any allergies you have.  All medicines you are taking, including vitamins, herbs, eye drops, creams, and over-the-counter medicines.  Any problems you or family members have had with anesthetic medicines.  Any blood disorders you have.  Any surgeries you have had.  Any medical conditions you have.  Whether you are pregnant or may be  pregnant. What happens before the procedure? No special preparation is needed. Eat and drink normally. What happens during the procedure?  In order to produce an image of your heart, gel will be applied to your chest and a wand-like tool (transducer) will be moved over your chest. The gel will help transmit the sound waves from the transducer. The sound waves will harmlessly bounce off your heart to allow the heart images to be captured in real-time motion. These images will then be recorded.  You may need an IV to receive a medicine that improves the quality of the pictures. What happens after the procedure? You may return to your normal schedule including diet, activities, and medicines, unless your health care provider tells you otherwise. This information is not intended to replace advice given to you by your health care provider. Make sure you discuss any questions you have with your health care provider. Document Released: 11/16/2000 Document Revised: 07/07/2016 Document Reviewed: 07/27/2013 Elsevier Interactive Patient Education  2017 [employer].    If you need a refill on your cardiac medications before your next appointment, please call your pharmacy.

## 2017-03-12 ENCOUNTER — Encounter: Payer: Self-pay | Admitting: Physician Assistant

## 2017-03-12 ENCOUNTER — Telehealth: Payer: Self-pay | Admitting: *Deleted

## 2017-03-12 LAB — COMPREHENSIVE METABOLIC PANEL
ALBUMIN: 3.5 g/dL (ref 3.5–4.8)
ALK PHOS: 86 IU/L (ref 39–117)
ALT: 26 IU/L (ref 0–44)
AST: 32 IU/L (ref 0–40)
Albumin/Globulin Ratio: 1 — ABNORMAL LOW (ref 1.2–2.2)
BILIRUBIN TOTAL: 0.7 mg/dL (ref 0.0–1.2)
BUN / CREAT RATIO: 13 (ref 10–24)
BUN: 14 mg/dL (ref 8–27)
CHLORIDE: 96 mmol/L (ref 96–106)
CO2: 21 mmol/L (ref 18–29)
Calcium: 9.3 mg/dL (ref 8.6–10.2)
Creatinine, Ser: 1.1 mg/dL (ref 0.76–1.27)
GFR calc Af Amer: 78 mL/min/{1.73_m2} (ref >59)
GFR calc non Af Amer: 67 mL/min/{1.73_m2} (ref >59)
GLUCOSE: 108 mg/dL — AB (ref 65–99)
Globulin, Total: 3.6 g/dL (ref 1.5–4.5)
Potassium: 3.7 mmol/L (ref 3.5–5.2)
SODIUM: 136 mmol/L (ref 134–144)
Total Protein: 7.1 g/dL (ref 6.0–8.5)

## 2017-03-12 LAB — PRO B NATRIURETIC PEPTIDE: NT-PRO BNP: 4407 pg/mL — AB (ref 0–376)

## 2017-03-12 MED ORDER — POTASSIUM CHLORIDE ER 10 MEQ PO TBCR: 10.0000 meq | EXTENDED_RELEASE_TABLET | Freq: Two times a day (BID) | ORAL | 11 refills | Status: DC

## 2017-03-12 MED ORDER — FUROSEMIDE 40 MG PO TABS: 40.0000 mg | ORAL_TABLET | Freq: Two times a day (BID) | ORAL | 11 refills | Status: DC

## 2017-03-12 NOTE — Telephone Encounter (Signed)
-----   Message from [place], [place] sent at 03/12/2017  8:04 AM EDT ----- Evidence of fluid overload based on labs and exam. Increase lasix to '40mg'$  BID with Kdur 1mq BID (new). Cut back on salt.   #Weigh yourself on the same scale at same time of day and keep a log. #Report weight gain of > 3 lbs in 1 day or 5 lbs over the course of a week and/or symptoms of excess fluid (shortness of breath, difficulty lying flat, swelling, poor appetite, abdominal fullness/bloating, etc) to your doctor immediately. #Avoid foods that are high in sodium (processed, pre-packaged/canned goods, fast foods, etc). #Please attend all scheduled and reccommended follow up appointments

## 2017-03-15 ENCOUNTER — Other Ambulatory Visit: Payer: Self-pay | Admitting: *Deleted

## 2017-03-15 MED ORDER — DIGOXIN 125 MCG PO TABS: 0.2500 mg | ORAL_TABLET | Freq: Every day | ORAL | 3 refills | Status: AC

## 2017-03-25 ENCOUNTER — Other Ambulatory Visit: Payer: Self-pay

## 2017-03-25 ENCOUNTER — Ambulatory Visit (HOSPITAL_COMMUNITY): Payer: Medicare Other | Attending: Cardiology

## 2017-03-25 DIAGNOSIS — I4891 Unspecified atrial fibrillation: Secondary | ICD-10-CM | POA: Diagnosis not present

## 2017-03-25 DIAGNOSIS — R600 Localized edema: Secondary | ICD-10-CM

## 2017-03-25 DIAGNOSIS — I5022 Chronic systolic (congestive) heart failure: Secondary | ICD-10-CM

## 2017-03-25 DIAGNOSIS — I340 Nonrheumatic mitral (valve) insufficiency: Secondary | ICD-10-CM | POA: Insufficient documentation

## 2017-03-25 DIAGNOSIS — R0602 Shortness of breath: Secondary | ICD-10-CM | POA: Diagnosis not present

## 2017-03-29 ENCOUNTER — Telehealth: Payer: Self-pay | Admitting: *Deleted

## 2017-03-29 ENCOUNTER — Encounter: Payer: Self-pay | Admitting: Physician Assistant

## 2017-03-29 ENCOUNTER — Other Ambulatory Visit: Payer: Medicare Other | Admitting: *Deleted

## 2017-03-29 ENCOUNTER — Ambulatory Visit (INDEPENDENT_AMBULATORY_CARE_PROVIDER_SITE_OTHER): Payer: Medicare Other | Admitting: Physician Assistant

## 2017-03-29 VITALS — BP 118/56 | HR 91 | Ht 68.0 in | Wt 233.8 lb

## 2017-03-29 DIAGNOSIS — I2510 Atherosclerotic heart disease of native coronary artery without angina pectoris: Secondary | ICD-10-CM | POA: Diagnosis not present

## 2017-03-29 DIAGNOSIS — I10 Essential (primary) hypertension: Secondary | ICD-10-CM

## 2017-03-29 DIAGNOSIS — C3411 Malignant neoplasm of upper lobe, right bronchus or lung: Secondary | ICD-10-CM

## 2017-03-29 DIAGNOSIS — I5022 Chronic systolic (congestive) heart failure: Secondary | ICD-10-CM

## 2017-03-29 DIAGNOSIS — I482 Chronic atrial fibrillation, unspecified: Secondary | ICD-10-CM

## 2017-03-29 DIAGNOSIS — Z8679 Personal history of other diseases of the circulatory system: Secondary | ICD-10-CM

## 2017-03-29 MED ORDER — LISINOPRIL 2.5 MG PO TABS: 2.5000 mg | ORAL_TABLET | Freq: Every day | ORAL | 3 refills | Status: DC

## 2017-03-29 NOTE — Patient Instructions (Addendum)
Medication Instructions:  1. START LISINOPRIL 2.5 MG 1 TABLET DAILY  Labwork: 1. TODAY BMET 2. BMET TO BE DONE IN 1 WEEK  Testing/Procedures: NONE ORDERED  Follow-Up: 04/24/17 @ 9:45 WITH SCOTT WEAVER, PAC  Any Other Special Instructions Will Be Listed Below (If Applicable).  If you need a refill on your cardiac medications before your next appointment, please call your pharmacy.

## 2017-03-29 NOTE — Progress Notes (Signed)
Cardiology Office Note:    Date:  03/29/2017   ID:  Alejandro Mates., DOB [birth year], MRN [id number]  PCP:  [name], MD  Cardiologist:  Dr. [name]   Electrophysiologist:  n/a Oncologist: Dr. Cleaster Corin [facility]) Pulmonologist: Dr. [name]   Referring MD: Colon Branch, MD   Chief Complaint  Patient presents with  . Congestive Heart Failure    follow up    History of Present Illness:    Alejandro Trudell. is a 71 y.o. male with a hx of CAD, status post CABG in 12/2008 (LIMA-LAD, SVG-diagonal, SVG-OM, SVG-PDA) ischemic/nonischemic cardiomyopathy, systolic CHF, HTN, HL, permanent atrial fibrillation, Subdural hematoma 5/15, lung CA. EF has been severely depressed in the past. MRI in 09/2013 demonstrated improved LV function with an EF of 52%  Admitted 04/2014 after falling and hitting his head resulting in a subdural hematoma which required craniotomy and hematoma evacuation. He ultimately underwent reexploration of right craniotomy for partial temporal lobectomy and evacuation of right temporal hematoma with craniectomy. He was taken off anticoagulation and is not felt to be a candidate for future anticoagulation.   For his stage IIIa non-small cell lung CA, he underwent concurrent chemoradiation with carboplatin and paclitaxel. His beta-blocker dose had to be adjusted due to hypotension.  In 4/16, he was seen for volume excess. Nuclear stress test was high risk with inferior wall and apical infarct but no ischemia with EF 24%. Follow-up echocardiogram demonstrated reduced LV function with EF 30-35%. Medical therapy was adjusted.  He was then admitted in 5/16 with AF with RVR in the setting of pneumonia.  Rate was controlled with beta blocker and digoxin. He was admitted again in 6/16 with recurrent pneumonia. He was seen by cardiology due to elevated troponin levels. This was felt to be related to demand ischemia. No further workup was recommended. He was then admitted to [facility] in 7/16 with pulmonary emboli. He tells me he remained on Xarelto for a year.  His oncologist did not want him on long term anticoagulation due to his SDH.  He was followed by cardiology at [facility] (Dr. Eileen [name]) for a while after this. He tells me his cancer is stable without significant change.  He returned to our office earlier this month to reestablish with Dr. Burt [name]. He was seen by Alejandro Kail, PA-C.  Pro-BNP was elevated at 4407.  Follow-up echocardiogram demonstrated worsening LV function with an EF of 20%. He returns for follow-up.  He is here with his wife.  He notes improved edema.  His dyspnea on exertion is unchanged.  He denies PND.  He denies chest pain or syncope. He has a cough.  He denies hemoptysis.    Prior CV studies:   The following studies were reviewed today:  Echo 03/25/17 EF 20, diffuse HK, mildly dilated ascending aorta (39 mm) cyst, trivial MR, moderate LAE, mildly reduced RVSF, mild RAE, PASP 33  Echo 7/16 [facility])  Mild concentric LVH, EF 40-45, mild BAE, normal RVSF   Nuclear Study 04/05/15 High risk; inferior wall and apex infarct. No ischemic noted. EF 24%.  Echo 04/19/15 EF 30-35, trivial AI, mild MR, severe LAE, moderate RAE  LHC (12/2008): Left main 95% LAD 90% circumflex marginal 70% EF 25-30%. => CABG  Echo (02/24/13):  Mild LVH, EF 30-35%, mild LAE, mild RVE, mildly reduced RVSF  Cardiac MRI (09/08/13):   EF 52%, no hyper-enhancement or infarct in LV myocardium, mild to moderate LAE,  mild MR  Past Medical History:  Diagnosis Date  . Alcohol abuse   . Atrial fibrillation ([place])   . CAD (coronary artery disease)    s/p CABGx4 on 12/24/2008  . Cancer ([place])    around nose.  New dx of lung cancer  . CHF (congestive heart failure) ([place])   . Dyslipidemia   . Dysrhythmia    atrial fib  . GERD (gastroesophageal reflux disease)   . H/O hiatal hernia   . HTN (hypertension)   . Myocardial infarction ([place])   .  Non-small cell carcinoma of lung, stage 3 12/16/2014  . S/P chemotherapy, time since 4-12 weeks   . Shortness of breath    occ  . Stroke ([place]) 5/15  . Systolic heart failure ([place])   . Tobacco abuse     Past Surgical History:  Procedure Laterality Date  . APPLICATION OF A-CELL OF CHEST/ABDOMEN N/A 01/22/2013   Procedure: APPLICATION OF A-CELL OF CHEST/ABDOMEN;  Surgeon: [name], MD;  Location: [place];  Service: General;  Laterality: N/A;  . CORONARY ARTERY BYPASS GRAFT  12/24/2008   4 vessel  . CORONARY ARTERY BYPASS GRAFT  2012?  . CRANIOTOMY Right 04/19/2014   Procedure: CRANIOTOMY HEMATOMA EVACUATION SUBDURAL;  Surgeon: [name], MD;  Location: [place] NEURO ORS;  Service: Neurosurgery;  Laterality: Right;  right  . CRANIOTOMY Right 04/23/2014   Procedure: Craniotomy for Intracerebral Hemorrhage;  Surgeon: [name], MD;  Location: [place] NEURO ORS;  Service: Neurosurgery;  Laterality: Right;  . CRANIOTOMY N/A 06/16/2014   Procedure: Craniotomy for intracerebral abscess and subdural empyema;  Surgeon: [name], MD;  Location: [place] NEURO ORS;  Service: Neurosurgery;  Laterality: N/A;  . CRANIOTOMY N/A 09/13/2014   Procedure: CRANIOTOMY BONE FLAP/PROSTHETIC PLATE;  Surgeon: [name], MD;  Location: [place] NEURO ORS;  Service: Neurosurgery;  Laterality: N/A;  . CYSTOSCOPY N/A 01/22/2013   Procedure: CYSTOSCOPY;  Surgeon: [name], MD;  Location: [place];  Service: Urology;  Laterality: N/A;  Cystoscopy with balloon dilation. Insertion of coude catheter.  Marland Kitchen HERNIA REPAIR  2012  . UMBILICAL HERNIA REPAIR  2010  . VENTRAL HERNIA REPAIR N/A 01/22/2013   Procedure: HERNIA REPAIR VENTRAL ADULT;  Surgeon: [name], MD;  Location: [place];  Service: General;  Laterality: N/A;  . VIDEO BRONCHOSCOPY WITH ENDOBRONCHIAL ULTRASOUND N/A 11/09/2014   Procedure: VIDEO BRONCHOSCOPY WITH ENDOBRONCHIAL ULTRASOUND;  Surgeon: Collene Gobble, MD;  Location: [facility];  Service: Thoracic;  Laterality: N/A;     Current Medications: Current Meds  Medication Sig  . albuterol (PROVENTIL HFA;VENTOLIN HFA) 108 (90 Base) MCG/ACT inhaler Inhale 2 puffs into the lungs every 4 (four) hours as needed for wheezing or shortness of breath.  Marland Kitchen aspirin 81 MG tablet Take 81 mg by mouth daily.  Marland Kitchen atorvastatin (LIPITOR) 40 MG tablet Take 1 tablet (40 mg total) by mouth daily.  . digoxin (LANOXIN) 0.125 MG tablet Take 2 tablets (0.25 mg total) by mouth daily.  . furosemide (LASIX) 40 MG tablet Take 1 tablet (40 mg total) by mouth 2 (two) times daily.  Marland Kitchen HYDROcodone-homatropine (HYCODAN) 5-1.5 MG/5ML syrup Take 5 mLs by mouth 2 (two) times daily as needed for cough.  . lidocaine-prilocaine (EMLA) cream Apply 1 application topically as needed.  . metoprolol 75 MG TABS Take 75 mg by mouth 2 (two) times daily.  . Multiple Vitamin (MULTIVITAMIN WITH MINERALS) TABS tablet Take 1 tablet by mouth daily.  Marland Kitchen  omeprazole (PRILOSEC) 40 MG capsule Take 40 mg by mouth daily.  . potassium chloride (K-DUR) 10 MEQ tablet Take 1 tablet (10 mEq total) by mouth 2 (two) times daily.     Allergies:   Patient has no known allergies.   Social History   Social History  . Marital status: Married    Spouse name: N/A  . Number of children: 2  . Years of education: N/A   Occupational History  . [employer] - retired    Social History Main Topics  . Smoking status: Former Smoker    Packs/day: 1.00    Years: 50.00    Types: Cigarettes    Quit date: 04/19/2013  . Smokeless tobacco: Never Used  . Alcohol use 4.2 oz/week    7 Cans of beer per week  . Drug use: No  . Sexual activity: Not Asked   Other Topics Concern  . None   Social History Narrative   Lives in [place], [place] with wife.  Ambulates with a cane     Family History  Problem Relation Age of Onset  . Diabetes Mother   . Heart disease Mother   . Heart attack Mother   . Diabetes Father   . Heart disease Father   . Heart attack Father   .  Rheum arthritis Daughter   . Heart disease Brother     s/p CABG at 85  . Heart attack Brother   . Colon cancer Neg Hx   . Prostate cancer Neg Hx   . Stroke Neg Hx      ROS:   Please see the history of present illness.    ROS All other systems reviewed and are negative.   EKGs/Labs/Other Test Reviewed:    EKG:  EKG is  ordered today.  The ekg ordered today demonstrates AF, HR 91, LAD, PVC, IVCD, PRWP, QTc 492 ms - similar to prior tracings   Recent Labs: 10/08/2016: Hemoglobin 13.8; Platelets 191.0 02/18/2017: TSH 2.77 03/11/2017: ALT 26; BUN 14; Creatinine, Ser 1.10; NT-Pro BNP 4,407; Potassium 3.7; Sodium 136   Recent Lipid Panel    Component Value Date/Time   CHOL 144 05/24/2015 0630   TRIG 221 (H) 05/24/2015 0630   HDL 16 (L) 05/24/2015 0630   CHOLHDL 9.0 05/24/2015 0630   VLDL 44 (H) 05/24/2015 0630   LDLCALC 84 05/24/2015 0630     Physical Exam:    VS:  BP (!) 118/56   Pulse 91   Ht '5\' 8"'$  (1.727 m)   Wt 233 lb 12.8 oz (106.1 kg)   BMI 35.55 kg/m     Wt Readings from Last 3 Encounters:  03/29/17 233 lb 12.8 oz (106.1 kg)  03/11/17 238 lb 12.8 oz (108.3 kg)  02/18/17 237 lb 6 oz (107.7 kg)     Physical Exam  Constitutional: He is oriented to person, place, and time. He appears well-developed and well-nourished. No distress.  HENT:  Head: Normocephalic and atraumatic.  Eyes: No scleral icterus.  Neck: Normal range of motion. No JVD present.  Cardiovascular: Normal rate, S1 normal and S2 normal.  An irregularly irregular rhythm present.  No murmur heard. Pulmonary/Chest: Effort normal. He has decreased breath sounds. He has no wheezes. He has no rhonchi. He has no rales.  Abdominal: Soft. There is no tenderness.  Musculoskeletal: He exhibits edema (very trace ankle edema bilaterally).  Neurological: He is alert and oriented to person, place, and time.  Skin: Skin is warm and dry.  Psychiatric: He has a normal mood and affect.    ASSESSMENT:    1.  Chronic systolic CHF (congestive heart failure) ([place])   2. Atrial fibrillation, chronic ([place])   3. Coronary artery disease involving native coronary artery of native heart without angina pectoris   4. Non-small cell carcinoma of lung, stage 3   5. History of subdural hematoma    PLAN:    In order of problems listed above:  1. Chronic systolic CHF (congestive heart failure) ([place]) - He has a hx of mixed ischemic and non-ischemic CM.  It is possible some of his low EF in the past was related to tachycardia from AF.  He did have improved EF on Echo at [facility] in 7/16 (40-45).  It is now 20%.  HR seems controlled.  D/w Dr. [name].  Given his multiple comorbid illnesses, we will advance medical therapy and try to avoid invasive testing. Continue beta blocker, digoxin. Add lisinopril 2.5 mg daily. Check BMET today and repeat in one week.  Plan repeat echo at some point to reevaluate EF.    2. Atrial fibrillation, chronic ([place])- Rate is controlled. He is not a candidate for anticoagulation. Continue beta blocker, digoxin.  3. Coronary artery disease involving native coronary artery of native heart without angina pectoris- Status post CABG in 2010. Myoview in 2016 without ischemia. He denies anginal symptoms. Continue aspirin, statin.  4. Non-small cell carcinoma of lung, stage 3- Continue follow-up with oncology.  5. History of subdural hematoma - As noted, he is not a candidate for long-term anticoagulation.  Total time spent with patient today 40 minutes. This includes reviewing records, evaluating the patient and coordinating care. Face-to-face time >50%.  Dispo:  Return in about 4 weeks (around 04/26/2017) for Close Follow Up, w/ Richardson Dopp, PA-C.   Medication Adjustments/Labs and Tests Ordered: Current medicines are reviewed at length with the patient today.  Concerns regarding medicines are outlined above.  Orders placed this visit:  Orders Placed This Encounter  Procedures  .  Basic Metabolic Panel (BMET)  . Basic Metabolic Panel (BMET)  . EKG 12-Lead   Medication changes this visit: Meds ordered this encounter  Medications  . lisinopril (PRINIVIL,ZESTRIL) 2.5 MG tablet    Sig: Take 1 tablet (2.5 mg total) by mouth daily.    Dispense:  90 tablet    Refill:  3    Signed, Richardson Dopp, PA-C  03/29/2017 1:25 PM    [facility] [address], [place], [place]  [address] Phone: [phone]; Fax: [phone]

## 2017-03-29 NOTE — Telephone Encounter (Signed)
-----   Message from Liliane Shi, [place] sent at 03/29/2017  4:26 PM EDT ----- Please call patient: The kidney function (BUN, Creatinine) and potassium are normal. All other parameters are within acceptable limits and no further intervention or testing required. Continue with current treatment plan. Richardson Dopp, PA-C    03/29/2017 4:26 PM

## 2017-03-29 NOTE — Telephone Encounter (Signed)
DPR for pt's wife Alejandro [name]. Pt's wife notified of pt's lab results by phone with verbal understanding. Pt wife aware to have pt continue on current Tx plan as discussed at [place] today.

## 2017-04-01 LAB — LIPID PANEL
Chol/HDL Ratio: 3.6 ratio (ref 0.0–5.0)
Cholesterol, Total: 105 mg/dL (ref 100–199)
HDL: 29 mg/dL — AB (ref >39)
LDL CALC: 55 mg/dL (ref 0–99)
Triglycerides: 103 mg/dL (ref 0–149)
VLDL CHOLESTEROL CAL: 21 mg/dL (ref 5–40)

## 2017-04-01 LAB — BASIC METABOLIC PANEL
BUN / CREAT RATIO: 9 — AB (ref 10–24)
BUN: 10 mg/dL (ref 8–27)
CO2: 24 mmol/L (ref 18–29)
CREATININE: 1.14 mg/dL (ref 0.76–1.27)
Calcium: 9.4 mg/dL (ref 8.6–10.2)
Chloride: 97 mmol/L (ref 96–106)
GFR calc Af Amer: 74 mL/min/{1.73_m2} (ref >59)
GFR calc non Af Amer: 64 mL/min/{1.73_m2} (ref >59)
GLUCOSE: 129 mg/dL — AB (ref 65–99)
POTASSIUM: 4.1 mmol/L (ref 3.5–5.2)
SODIUM: 138 mmol/L (ref 134–144)

## 2017-04-04 ENCOUNTER — Other Ambulatory Visit: Payer: Medicare Other | Admitting: *Deleted

## 2017-04-04 ENCOUNTER — Encounter: Payer: Self-pay | Admitting: Emergency Medicine

## 2017-04-04 ENCOUNTER — Ambulatory Visit (INDEPENDENT_AMBULATORY_CARE_PROVIDER_SITE_OTHER): Payer: Medicare Other | Admitting: Emergency Medicine

## 2017-04-04 DIAGNOSIS — R05 Cough: Secondary | ICD-10-CM | POA: Diagnosis not present

## 2017-04-04 DIAGNOSIS — I2510 Atherosclerotic heart disease of native coronary artery without angina pectoris: Secondary | ICD-10-CM

## 2017-04-04 DIAGNOSIS — I5022 Chronic systolic (congestive) heart failure: Secondary | ICD-10-CM | POA: Diagnosis not present

## 2017-04-04 DIAGNOSIS — C3411 Malignant neoplasm of upper lobe, right bronchus or lung: Secondary | ICD-10-CM

## 2017-04-04 DIAGNOSIS — R053 Chronic cough: Secondary | ICD-10-CM | POA: Insufficient documentation

## 2017-04-04 LAB — BASIC METABOLIC PANEL
BUN/Creatinine Ratio: 11 (ref 10–24)
BUN: 13 mg/dL (ref 8–27)
CALCIUM: 9.2 mg/dL (ref 8.6–10.2)
CO2: 23 mmol/L (ref 18–29)
CREATININE: 1.22 mg/dL (ref 0.76–1.27)
Chloride: 93 mmol/L — ABNORMAL LOW (ref 96–106)
GFR, EST AFRICAN AMERICAN: 69 mL/min/{1.73_m2} (ref >59)
GFR, EST NON AFRICAN AMERICAN: 59 mL/min/{1.73_m2} — AB (ref >59)
Glucose: 166 mg/dL — ABNORMAL HIGH (ref 65–99)
Potassium: 3.9 mmol/L (ref 3.5–5.2)
Sodium: 132 mmol/L — ABNORMAL LOW (ref 134–144)

## 2017-04-04 MED ORDER — TIOTROPIUM BROMIDE-OLODATEROL 2.5-2.5 MCG/ACT IN AERS: 2.0000 | INHALATION_SPRAY | Freq: Every day | RESPIRATORY_TRACT | 0 refills | Status: DC

## 2017-04-04 MED ORDER — VALSARTAN 80 MG PO TABS: 80.0000 mg | ORAL_TABLET | Freq: Every day | ORAL | 3 refills | Status: DC

## 2017-04-04 NOTE — Assessment & Plan Note (Signed)
We will try starting Stiolto 2 puffs once a day. Try to check with her insurance to see if this is on their covered formulary Follow with APP in 2-3 weeks to determine whether the Stiolto will be a good choice for [place].,  Follow with Dr [name] in 3 months or sooner if you have any problems.

## 2017-04-04 NOTE — Progress Notes (Signed)
HPI: 71 yo man follows for RUL mass and mediastinal LAD. We performed EBUS, got good nodal tissue but no dx. He follows now to discuss next options. He had a repeat CT scan today. He is feeling well s/p our bronchoscopy.   ROV 03/22/15 -- 71 yo former smoker (40-50 pk-yrs) patient follows for squamous cell lung cancer. He's been treated by Dr. Julien Nordmann with concurrent chemoradiation, last dose was about 5 weeks ago. CT scan of the chest performed 03/11/15 shows a decrease in size of his right upper lobe mass and similar mediastinal lymphadenopathy.  He is to restart chemo at the end of this month.  He has a lot of fatigue, has dyspnea that his wife points out is severe. He is not on any BD.   ROV 04/21/15 -- follow-up visit for squamous cell lung cancer and history of tobacco use with suspected COPD. Last visit we started Spiriva to see if he will benefit - hard to say if has been helpful. He has had a raspy voice ever since his EBUS, actually even before when he had an NGT back a year ago. He has some nasal congestion. He is on loratadine daily.   ROV 10/23/16 -- patient is a former smoker with a history of squamous cell lung cancer which was treated with concurrent chemoradiation. Most recent cancer evaluation was 05/24/16 by H/O at [facility]. He also history of pulmonary embolism and DVT. He was seen by Dr Larose Kells this month for increased cough and SOB, more fatigue. CT scan chst was done 10/12/16 that I have personally reviewed, shows evidence for pulmonary emboli that were felt to be chronic, right upper lobe and middle lobe infiltrates, a new prevascular lymph node and a new moderate right pleural effusion. He is currently on Eliquis. He is using albuterol prn, rarely.   ROV 01/03/17 -- this follow-up visit for patient with a history of squamous cell lung cancer treated with chemoradiation, former tobacco and COPD. He also has a history of pulmonary embolism. CT 10/12/16 as above. He underwent thoracic  ultrasound on 10/23/16 to consider thoracentesis but that was not enough fluid to sample at that time. Currently has albuterol to use as needed. He's been using this approximately about once a day. He has had increased cough, happening continuously for the last 3 weeks. Brownish mucous. He is also SOB over the same time period. He hears some wheeze. No better with albuterol. ? Possibly a low grade fever. He is supposed to see oncology at [facility] in March.   ROV 02/01/17 -- this follow-up visit fofr COPD, squamous cell lung CA, PE in 11/17. At his last visit 1 month ago he was having cough productive of brown sputum, was treated w levaquin for possible acute bronchitis. CXR did not sow an infiltrate that day. We started Spiriva to see if he would benefit >> cough may have improved some on the abx but he never obtained and started the Spiriva. He is due to see Oncology at [facility] have a repeat scan in June 18. He c/o nasal congestion and UA cough (diferent from last visit's cough). Tan mucous  ROV 04/04/17 -- this follow-up visit for patient with a history of COPD, squamous cell lung cancer that was treated with chemoradiation, pulmonary embolism in November 2017. I have tried to start him unsuccessfully on Spiriva for insurance reasons. Then we started unknown oh at his last visit. He tells me that he took this 2 weeks, had a lot of  trouble with cough due to the powder. He isn't sure that it helped him in any way. He was treated approximately 6 weeks ago for cough, got codeine cough syrup. He was just started on lisinopril 1 week ago. He is having exertional SOB, happens with any walking or light activity. He is scheduled for a CT chest in June at [facility]. He has run out of albuterol. He is having a lot of sinus drainage, doesn;t notice any breakthrough heartburn on prilosec.    Vitals:   04/04/17 1336  BP: 116/62  Pulse: (!) 136  SpO2: 90%  Weight: 235 lb 6.4 oz (106.8 kg)  Height: '5\' 8"'$  (1.727 m)   Gen:  Pleasant, well-nourished, in no distress,  normal affect  ENT: No lesions,  mouth clear,  oropharynx clear, no postnasal drip  Neck: No JVD, no stridor, hoarse voice  Lungs: No use of accessory muscles, distant, clear without rales or rhonchi  Cardiovascular: RRR, heart sounds normal, no murmur or gallops, no peripheral edema  Musculoskeletal: No deformities, no cyanosis or clubbing  Neuro: alert, non focal  Skin: Warm, no lesions or rashes    CT scan 03/11/15 --  IMPRESSION: 1. Reduced size of the right upper lobe mass. The measurement which is thought to be most suitable and replicable given the orientation of the lesion is the vertical thickness ; currently this is 7 mm compared to 1.3 cm previously. This indicates improvement in the primary lesion. 2. Persistent thoracic scratch that persistent mediastinal adenopathy, roughly similar to the prior exam. 3. New small bilateral pleural effusions with passive atelectasis. 4. Atherosclerosis.   COPD mixed type We will try starting Stiolto 2 puffs once a day. Try to check with her insurance to see if this is on their covered formulary Follow with APP in 2-3 weeks to determine whether the Stiolto will be a good choice for [place].,  Follow with Dr [name] in 3 months or sooner if you have any problems.  Chronic cough Stop lisinopril Try starting valsartan 80 mg daily Start loratadine 10 mg once a day You continue to have nasal drainage and/or cough then please start Flonase nasal spray, 2 sprays each nostril once a day Continue Prilosec once a day as you have been taking it  Non-small cell carcinoma of lung, stage 3 Follow at [facility] to review your CT scan as planned  Baltazar Apo, MD, PhD 04/04/2017, 1:53 PM [facility] [phone] or if no answer [phone]

## 2017-04-04 NOTE — Patient Instructions (Addendum)
We will try starting Stiolto 2 puffs once a day. Try to check with her insurance to see if this is on their covered formulary Stop lisinopril Try starting valsartan 80 mg daily Start loratadine 10 mg once a day You continue to have nasal drainage and/or cough then please start Flonase nasal spray, 2 sprays each nostril once a day Follow at [facility] to review your CT scan as planned Continue Prilosec once a day as you have been taking it Follow with APP in 2-3 weeks to determine whether the Stiolto will be a good choice for [place].,  Follow with Dr [name] in 3 months or sooner if you have any problems.

## 2017-04-04 NOTE — Assessment & Plan Note (Signed)
Stop lisinopril Try starting valsartan 80 mg daily Start loratadine 10 mg once a day You continue to have nasal drainage and/or cough then please start Flonase nasal spray, 2 sprays each nostril once a day Continue Prilosec once a day as you have been taking it

## 2017-04-04 NOTE — Assessment & Plan Note (Signed)
Follow at [facility] to review your CT scan as planned

## 2017-04-04 NOTE — Addendum Note (Signed)
Addended by: Jannette Spanner on: 04/04/2017 02:04 PM   Modules accepted: Orders

## 2017-04-11 ENCOUNTER — Ambulatory Visit (INDEPENDENT_AMBULATORY_CARE_PROVIDER_SITE_OTHER): Payer: Medicare Other | Admitting: Acute Care

## 2017-04-11 ENCOUNTER — Encounter: Payer: Self-pay | Admitting: Acute Care

## 2017-04-11 DIAGNOSIS — J301 Allergic rhinitis due to pollen: Secondary | ICD-10-CM | POA: Diagnosis not present

## 2017-04-11 DIAGNOSIS — I2510 Atherosclerotic heart disease of native coronary artery without angina pectoris: Secondary | ICD-10-CM

## 2017-04-11 DIAGNOSIS — C3411 Malignant neoplasm of upper lobe, right bronchus or lung: Secondary | ICD-10-CM | POA: Diagnosis not present

## 2017-04-11 DIAGNOSIS — J449 Chronic obstructive pulmonary disease, unspecified: Secondary | ICD-10-CM | POA: Diagnosis not present

## 2017-04-11 MED ORDER — VALSARTAN 80 MG PO TABS: 80.0000 mg | ORAL_TABLET | Freq: Every day | ORAL | 3 refills | Status: DC

## 2017-04-11 MED ORDER — TIOTROPIUM BROMIDE-OLODATEROL 2.5-2.5 MCG/ACT IN AERS: 2.0000 | INHALATION_SPRAY | Freq: Every day | RESPIRATORY_TRACT | 0 refills | Status: DC

## 2017-04-11 NOTE — Assessment & Plan Note (Signed)
Returned for follow up after 1 week of use Continued allergy symptoms, did not start Flonase of Claritin Continued cough, stopped Lisiopril, but did not start Valsartan Plan: Please start Loratidine 10 mg once daily. Please start Flonase 2 puffs in each nostril once daily. Use nasal saline and blow nose well prior to using flonase each morning. Start taking the valsartan 80 mg daily as Dr. [name] prescribed last week ( Please call into [employer] on [address]) Continue the [place] 2 puffs once daily. We will give you financial paperwork for Stialto, please complete and bring with you at next visit. Rinse mouth after use. Follow up in 3 weeks with NP to evaluate effectiveness of Stialto Follow up CT scan in June 2018 at [facility] as is scheduled. Please contact office for sooner follow up if symptoms do not improve or worsen or seek emergency care

## 2017-04-11 NOTE — Assessment & Plan Note (Signed)
Follow up CT in 05/2017 as is scheduled at [facility]

## 2017-04-11 NOTE — Assessment & Plan Note (Signed)
Start Flonase and Claritin as directed for seasonal allergies

## 2017-04-11 NOTE — Progress Notes (Signed)
History of Present Illness Alejandro [name]. is a 71 y.o. male former smoker ( 50 pack yeats) with COPD, squamous cell cancer ( treated with chemoradiation) , and PE 10/2016. He is followed by Dr. [name].   04/11/2017 1 week follow up appointment Pt. Presents for follow up of therapeutic trial of  Stialto early ( got dates wrong, was supposed to follow up 5/17). He states in the week he  has used the [place] it may be helping a bit. It is hard to say after such a short time. He is continuing to have post nasal gtt, cough, and nasal stuffiness.Nasal secretions are clear. He has not started the loratidine 10 mg once daily, or the flonase nasal spray that Dr. [name] asked he start last week.Marland KitchenHe states he did stop taking his Lisinopril, but has not started his Valsartan daily. We discussed the importance of taking the new blood pressure medication.He says he cannot tell a difference with his cough. He does say he was exposed to a sick grandchild who had a sore throat and sneezing. He states his shortness of breath is about the same as it was last week when he saw Dr. [name]. Secretions are unchanged.He denies fever, chest pain, orthopnea or hemoptysis.He denies wheezing, or productive cough.  Spent significant time with education today  Test Results:  CBC Latest Ref Rng & Units 10/08/2016 08/26/2015 06/07/2015  WBC 4.0 - 10.5 K/uL 8.1 8.5 6.0  Hemoglobin 13.0 - 17.0 g/dL 13.8 11.8(L) 8.8(L)  Hematocrit 39.0 - 52.0 % 41.0 35.3(L) 26.3(L)  Platelets 150.0 - 400.0 K/uL 191.0 195.0 214    BMP Latest Ref Rng & Units 04/04/2017 03/29/2017 03/11/2017  Glucose 65 - 99 mg/dL 166(H) 129(H) 108(H)  BUN 8 - 27 mg/dL '13 10 14  '$ Creatinine 0.76 - 1.27 mg/dL 1.22 1.14 1.10  BUN/Creat Ratio 10 - 24 11 9(L) 13  Sodium 134 - 144 mmol/L 132(L) 138 136  Potassium 3.5 - 5.2 mmol/L 3.9 4.1 3.7  Chloride 96 - 106 mmol/L 93(L) 97 96  CO2 18 - 29 mmol/L '23 24 21  '$ Calcium 8.6 - 10.2 mg/dL 9.2 9.4 9.3    BNP    Component  Value Date/Time   BNP 141.1 (H) 05/23/2015 1313    ProBNP    Component Value Date/Time   PROBNP 4,407 (H) 03/11/2017 1147   PROBNP 252.0 (H) 04/05/2015 1134     Past medical hx Past Medical History:  Diagnosis Date  . Alcohol abuse   . Atrial fibrillation ([place])   . CAD (coronary artery disease)    s/p CABGx4 on 12/24/2008  . Cancer ([place])    around nose.  New dx of lung cancer  . CHF (congestive heart failure) ([place])   . Dyslipidemia   . Dysrhythmia    atrial fib  . GERD (gastroesophageal reflux disease)   . H/O hiatal hernia   . HTN (hypertension)   . Myocardial infarction ([place])   . Non-small cell carcinoma of lung, stage 3 12/16/2014  . S/P chemotherapy, time since 4-12 weeks   . Shortness of breath    occ  . Stroke ([place]) 5/15  . Systolic heart failure ([place])   . Tobacco abuse      Social History  Substance Use Topics  . Smoking status: Former Smoker    Packs/day: 1.00    Years: 50.00    Types: Cigarettes    Quit date: 04/19/2013  . Smokeless tobacco: Never Used  . Alcohol use 4.2  oz/week    7 Cans of beer per week    Tobacco Cessation: Pt. Is a former smoker, quit 2014.( 50 pack year smoking history)   Past surgical hx, Family hx, Social hx all reviewed.  Current Outpatient Prescriptions on File Prior to Visit  Medication Sig  . albuterol (PROVENTIL HFA;VENTOLIN HFA) 108 (90 Base) MCG/ACT inhaler Inhale 2 puffs into the lungs every 4 (four) hours as needed for wheezing or shortness of breath.  Marland Kitchen aspirin 81 MG tablet Take 81 mg by mouth daily.  Marland Kitchen atorvastatin (LIPITOR) 40 MG tablet Take 1 tablet (40 mg total) by mouth daily.  . digoxin (LANOXIN) 0.125 MG tablet Take 2 tablets (0.25 mg total) by mouth daily.  . furosemide (LASIX) 40 MG tablet Take 1 tablet (40 mg total) by mouth 2 (two) times daily.  Marland Kitchen HYDROcodone-homatropine (HYCODAN) 5-1.5 MG/5ML syrup Take 5 mLs by mouth 2 (two) times daily as needed for cough.  . lidocaine-prilocaine (EMLA) cream Apply 1  application topically as needed.  . metoprolol 75 MG TABS Take 75 mg by mouth 2 (two) times daily.  . Multiple Vitamin (MULTIVITAMIN WITH MINERALS) TABS tablet Take 1 tablet by mouth daily.  Marland Kitchen omeprazole (PRILOSEC) 40 MG capsule Take 40 mg by mouth daily.  . potassium chloride (K-DUR) 10 MEQ tablet Take 1 tablet (10 mEq total) by mouth 2 (two) times daily.  . Tiotropium Bromide-Olodaterol (STIOLTO RESPIMAT) 2.5-2.5 MCG/ACT AERS Inhale 2 puffs into the lungs daily.   No current facility-administered medications on file prior to visit.      No Known Allergies  Review Of Systems:  Constitutional:   No  weight loss, night sweats,  Fevers, chills, +fatigue, or  lassitude.  HEENT:   No headaches,  Difficulty swallowing,  Tooth/dental problems, or  Sore throat,                No sneezing, itching, ear ache, +nasal congestion,+ post nasal drip,   CV:  No chest pain,  Orthopnea, PND, swelling in lower extremities, anasarca, dizziness, palpitations, syncope.   GI  No heartburn, indigestion, abdominal pain, nausea, vomiting, diarrhea, change in bowel habits, loss of appetite, bloody stools.   Resp: + shortness of breath with exertion or at rest.  No excess mucus, no productive cough,  + non-productive cough,  No coughing up of blood.  No change in color of mucus.  No wheezing.  No chest wall deformity  Skin: no rash or lesions.  GU: no dysuria, change in color of urine, no urgency or frequency.  No flank pain, no hematuria   MS:  No joint pain or swelling.  No decreased range of motion.  No back pain.  Psych:  No change in mood or affect. No depression or anxiety.  No memory loss.   Vital Signs BP 132/74 (BP Location: Left Arm, Cuff Size: Normal)   Pulse 62   Temp 97 F (36.1 C) (Oral)   Ht '5\' 8"'$  (1.727 m)   Wt 236 lb 3.2 oz (107.1 kg)   SpO2 95%   BMI 35.91 kg/m    Physical Exam:  General- No distress,  A&Ox3, pleasant ENT: No sinus tenderness, TM clear, pale nasal mucosa, no  oral exudate,no post nasal drip, no LAN Cardiac: S1, S2, regular rate and rhythm, no murmur Chest: No wheeze/ rales/ dullness; no accessory muscle use, no nasal flaring, no sternal retractions Abd.: Soft Non-tender, obese Ext: No clubbing cyanosis, edema Neuro:  Alert and oriented x 3. MAE x  4, deconditioned at baseline Skin: No rashes, warm and dry Psych: normal mood and behavior   Assessment/Plan  COPD mixed type Returned for follow up after 1 week of use Continued allergy symptoms, did not start Flonase of Claritin Continued cough, stopped Lisiopril, but did not start Valsartan Plan: Please start Loratidine 10 mg once daily. Please start Flonase 2 puffs in each nostril once daily. Use nasal saline and blow nose well prior to using flonase each morning. Start taking the valsartan 80 mg daily as Dr. [name] prescribed last week ( Please call into [facility] on [address]) Continue the [place] 2 puffs once daily. We will give you financial paperwork for Stialto, please complete and bring with you at next visit. Rinse mouth after use. Follow up in 3 weeks with NP to evaluate effectiveness of Stialto Follow up CT scan in June 2018 at [facility] as is scheduled. Please contact office for sooner follow up if symptoms do not improve or worsen or seek emergency care    Non-small cell carcinoma of lung, stage 3 Follow up CT in 05/2017 as is scheduled at [facility]  Allergic rhinitis Start Flonase and Claritin as directed for seasonal allergies    Magdalen Spatz, NP 04/11/2017  11:44 AM

## 2017-04-11 NOTE — Patient Instructions (Addendum)
It is nice to meet you today Please start Loratidine 10 mg once daily. Please start Flonase 2 puffs in each nostril once daily. Use nasal saline and blow nose well prior to using flonase each morning. Start taking the valsartan 80 mg daily as Dr. [name] prescribed last week ( Please call into [facility] on [address]) Continue the [place] 2 puffs once daily. We will give you financial paperwork for Stialto, please complete and bring with you at next visit. Rinse mouth after use. Follow up in 3 weeks with NP to evaluate effectiveness of Stialto Follow up CT scan in June 2018 at [facility] as is scheduled. Please contact office for sooner follow up if symptoms do not improve or worsen or seek emergency care

## 2017-04-18 ENCOUNTER — Ambulatory Visit: Payer: Medicare Other | Admitting: Acute Care

## 2017-04-24 ENCOUNTER — Ambulatory Visit (INDEPENDENT_AMBULATORY_CARE_PROVIDER_SITE_OTHER): Payer: Medicare Other | Admitting: Physician Assistant

## 2017-04-24 ENCOUNTER — Encounter: Payer: Self-pay | Admitting: Physician Assistant

## 2017-04-24 VITALS — BP 98/50 | HR 89 | Ht 68.0 in | Wt 237.8 lb

## 2017-04-24 DIAGNOSIS — I2510 Atherosclerotic heart disease of native coronary artery without angina pectoris: Secondary | ICD-10-CM

## 2017-04-24 DIAGNOSIS — I5022 Chronic systolic (congestive) heart failure: Secondary | ICD-10-CM | POA: Diagnosis not present

## 2017-04-24 DIAGNOSIS — Z8679 Personal history of other diseases of the circulatory system: Secondary | ICD-10-CM

## 2017-04-24 DIAGNOSIS — I482 Chronic atrial fibrillation: Secondary | ICD-10-CM

## 2017-04-24 DIAGNOSIS — I4821 Permanent atrial fibrillation: Secondary | ICD-10-CM

## 2017-04-24 MED ORDER — VALSARTAN 40 MG PO TABS: 40.0000 mg | ORAL_TABLET | Freq: Every day | ORAL | 11 refills | Status: DC

## 2017-04-24 NOTE — Progress Notes (Signed)
Cardiology Office Note:    Date:  04/24/2017   ID:  Alejandro [name]., DOB [birth year], MRN [id number]  PCP:  Colon Branch, MD  Cardiologist:  Dr. [name]   Electrophysiologist:  n/a Oncologist: Dr. Cleaster Corin [facility]) Pulmonologist: Dr. [name]   Referring MD: Colon Branch, MD   Chief Complaint  Patient presents with  . Congestive Heart Failure    Follow-up  . Atrial Fibrillation    Follow-up    History of Present Illness:    Alejandro [name]. is a 71 y.o. male with a hx of CAD, status post CABG in 12/2008 (LIMA-LAD, SVG-diagonal, SVG-OM, SVG-PDA) ischemic/nonischemic cardiomyopathy, systolic CHF, HTN, HL, permanent atrial fibrillation, subdural hematoma 5/15 after a fall resulting in craniotomy/hematoma evacuation (followed by R craniotomy and partial temporal lobectomy - not an anticoagulation candidate), lung CA. EF has been severely depressed in the past. MRI in 09/2013 demonstrated improved LV function with an EF of 52%.  He received chemotherapy (carboplatin and paclitaxel). Nuclear stress test in 4/16 was high risk with inferior wall and apical infarct but no ischemia with EF 24%. Follow-up echocardiogram demonstrated reduced LV function with EF 30-35%. Medical therapy was adjusted.   He was admitted in 6/16 with pneumonia and elevated troponins felt to be related to demand ischemia. Medical therapy was continued. He was admitted to [facility] in 7/16 with pulmonary emboli and was placed on Rivaroxaban for 1 year. His oncologist took him off of anticoagulation due to his prior history of subdural hematoma. He was followed by cardiology at [facility] (Dr. Eileen Stanford) for a while after this.  He returned to reestablish with [facility] in 4/18.  Echo in 4/18 demonstrated worsening LV function with an EF of 20%. Given his advanced comorbid illnesses, we elected for conservative management and continued medical therapy. ACE inhibitor was added to his regimen.    Mr.  [name] returns for follow-up. He is here today with his wife. He notes a chronic cough. His pulmonologist changed ACE inhibitor to an ARB. He has chronic dyspnea. This is unchanged. He denies chest pain. He sleeps in a recliner chronically, mainly for comfort. He denies PND. He has chronic ankle edema without significant change. He denies syncope. He has been dizzy with standing. He denies any bleeding issues.  Prior CV studies:   The following studies were reviewed today:  Echo 03/25/17 EF 20, diffuse HK, mildly dilated ascending aorta (39 mm) cyst, trivial MR, moderate LAE, mildly reduced RVSF, mild RAE, PASP 33   Echo 7/16 [facility])  Mild concentric LVH, EF 40-45, mild BAE, normal RVSF    Nuclear Study 04/05/15 High risk; inferior wall and apex infarct.  No ischemic noted.   EF 24%.   Echo 04/19/15 EF 30-35, trivial AI, mild MR, severe LAE, moderate RAE   LHC (12/2008):  Left main 95% LAD 90% circumflex marginal 70% EF 25-30%. => CABG   Echo (02/24/13):   Mild LVH, EF 30-35%, mild LAE, mild RVE, mildly reduced RVSF   Cardiac MRI (09/08/13):    EF 52%, no hyper-enhancement or infarct in LV myocardium, mild to moderate LAE, mild MR  Past Medical History:  Diagnosis Date  . Alcohol abuse   . Atrial fibrillation ([place])   . CAD (coronary artery disease)    s/p CABGx4 on 12/24/2008  . Cancer ([place])    around nose.  New dx of lung cancer  . CHF (congestive heart failure) ([place])   .  Dyslipidemia   . Dysrhythmia    atrial fib  . GERD (gastroesophageal reflux disease)   . H/O hiatal hernia   . HTN (hypertension)   . Myocardial infarction ([place])   . Non-small cell carcinoma of lung, stage 3 12/16/2014  . S/P chemotherapy, time since 4-12 weeks   . Shortness of breath    occ  . Stroke ([place]) 5/15  . Systolic heart failure ([place])   . Tobacco abuse     Past Surgical History:  Procedure Laterality Date  . APPLICATION OF A-CELL OF CHEST/ABDOMEN N/A 01/22/2013   Procedure: APPLICATION  OF A-CELL OF CHEST/ABDOMEN;  Surgeon: [name], MD;  Location: [place];  Service: General;  Laterality: N/A;  . CORONARY ARTERY BYPASS GRAFT  12/24/2008   4 vessel  . CORONARY ARTERY BYPASS GRAFT  2012?  . CRANIOTOMY Right 04/19/2014   Procedure: CRANIOTOMY HEMATOMA EVACUATION SUBDURAL;  Surgeon: [name], MD;  Location: [place] NEURO ORS;  Service: Neurosurgery;  Laterality: Right;  right  . CRANIOTOMY Right 04/23/2014   Procedure: Craniotomy for Intracerebral Hemorrhage;  Surgeon: [name], MD;  Location: [place] NEURO ORS;  Service: Neurosurgery;  Laterality: Right;  . CRANIOTOMY N/A 06/16/2014   Procedure: Craniotomy for intracerebral abscess and subdural empyema;  Surgeon: [name], MD;  Location: [place] NEURO ORS;  Service: Neurosurgery;  Laterality: N/A;  . CRANIOTOMY N/A 09/13/2014   Procedure: CRANIOTOMY BONE FLAP/PROSTHETIC PLATE;  Surgeon: [name], MD;  Location: [place] NEURO ORS;  Service: Neurosurgery;  Laterality: N/A;  . CYSTOSCOPY N/A 01/22/2013   Procedure: CYSTOSCOPY;  Surgeon: [name], MD;  Location: [place];  Service: Urology;  Laterality: N/A;  Cystoscopy with balloon dilation. Insertion of coude catheter.  Marland Kitchen HERNIA REPAIR  2012  . UMBILICAL HERNIA REPAIR  2010  . VENTRAL HERNIA REPAIR N/A 01/22/2013   Procedure: HERNIA REPAIR VENTRAL ADULT;  Surgeon: [name], MD;  Location: [place];  Service: General;  Laterality: N/A;  . VIDEO BRONCHOSCOPY WITH ENDOBRONCHIAL ULTRASOUND N/A 11/09/2014   Procedure: VIDEO BRONCHOSCOPY WITH ENDOBRONCHIAL ULTRASOUND;  Surgeon: Collene Gobble, MD;  Location: [facility];  Service: Thoracic;  Laterality: N/A;    Current Medications: Current Meds  Medication Sig  . albuterol (PROVENTIL HFA;VENTOLIN HFA) 108 (90 Base) MCG/ACT inhaler Inhale 2 puffs into the lungs every 4 (four) hours as needed for wheezing or shortness of breath.  Marland Kitchen aspirin 81 MG tablet Take 81 mg by mouth daily.  . digoxin (LANOXIN) 0.125 MG tablet Take 2 tablets (0.25 mg  total) by mouth daily.  . furosemide (LASIX) 40 MG tablet Take 1 tablet (40 mg total) by mouth 2 (two) times daily.  Marland Kitchen HYDROcodone-homatropine (HYCODAN) 5-1.5 MG/5ML syrup Take 5 mLs by mouth 2 (two) times daily as needed for cough.  . lidocaine-prilocaine (EMLA) cream Apply 1 application topically as needed.  . metoprolol 75 MG TABS Take 75 mg by mouth 2 (two) times daily.  . Multiple Vitamin (MULTIVITAMIN WITH MINERALS) TABS tablet Take 1 tablet by mouth daily.  . potassium chloride (K-DUR) 10 MEQ tablet Take 1 tablet (10 mEq total) by mouth 2 (two) times daily.  . Tiotropium Bromide-Olodaterol (STIOLTO RESPIMAT) 2.5-2.5 MCG/ACT AERS Inhale 2 puffs into the lungs daily.  . [DISCONTINUED] valsartan (DIOVAN) 80 MG tablet Take 1 tablet (80 mg total) by mouth daily.     Allergies:   Patient has no known allergies.   Social History   Social History  . Marital  status: Married    Spouse name: N/A  . Number of children: 2  . Years of education: N/A   Occupational History  . [employer] - retired    Social History Main Topics  . Smoking status: Former Smoker    Packs/day: 1.00    Years: 50.00    Types: Cigarettes    Quit date: 04/19/2013  . Smokeless tobacco: Never Used  . Alcohol use 4.2 oz/week    7 Cans of beer per week  . Drug use: No  . Sexual activity: Not Asked   Other Topics Concern  . None   Social History Narrative   Lives in [place], [place] with wife.  Ambulates with a cane     Family Hx: The patient's family history includes Diabetes in his father and mother; Heart attack in his brother, father, and mother; Heart disease in his brother, father, and mother; Rheum arthritis in his daughter. There is no history of Colon cancer, Prostate cancer, or Stroke.  ROS:   Please see the history of present illness.    Review of Systems  Musculoskeletal: Positive for joint pain.  Neurological: Positive for dizziness.   All other systems reviewed and are  negative.   EKGs/Labs/Other Test Reviewed:    EKG:  EKG is  ordered today.  The ekg ordered today demonstrates Atrial fibrillation, HR 89, left axis deviation, IVCD, similar to prior tracings  Recent Labs: 10/08/2016: Hemoglobin 13.8; Platelets 191.0 02/18/2017: TSH 2.77 03/11/2017: ALT 26; NT-Pro BNP 4,407 04/04/2017: BUN 13; Creatinine, Ser 1.22; Potassium 3.9; Sodium 132   Recent Lipid Panel    Component Value Date/Time   CHOL 105 03/29/2017 1005   TRIG 103 03/29/2017 1005   HDL 29 (L) 03/29/2017 1005   CHOLHDL 3.6 03/29/2017 1005   CHOLHDL 9.0 05/24/2015 0630   VLDL 44 (H) 05/24/2015 0630   LDLCALC 55 03/29/2017 1005     Physical Exam:    VS:  BP (!) 98/50   Pulse 89   Ht 5\' 8"  (1.727 m)   Wt 237 lb 12.8 oz (107.9 kg)   SpO2 97%   BMI 36.16 kg/m     Wt Readings from Last 3 Encounters:  04/24/17 237 lb 12.8 oz (107.9 kg)  04/11/17 236 lb 3.2 oz (107.1 kg)  04/04/17 235 lb 6.4 oz (106.8 kg)     Physical Exam  Constitutional: He is oriented to person, place, and time. He appears well-developed and well-nourished. No distress.  HENT:  Head: Normocephalic and atraumatic.  Eyes: No scleral icterus.  Neck: Normal range of motion. No JVD present.  Cardiovascular: Normal rate, S1 normal and S2 normal.  An irregularly irregular rhythm present.  No murmur heard. Pulmonary/Chest: Effort normal and breath sounds normal. He has no wheezes. He has no rhonchi. He has no rales.  Abdominal: Soft. There is no tenderness.  Musculoskeletal: He exhibits edema ( trace bilateral ankle edema).  Neurological: He is alert and oriented to person, place, and time.  Skin: Skin is warm and dry.  Psychiatric: He has a normal mood and affect.    ASSESSMENT:    1. Chronic systolic CHF (congestive heart failure) ([place])   2. Permanent atrial fibrillation ([place])   3. Coronary artery disease involving native coronary artery of native heart without angina pectoris   4. History of subdural  hematoma    PLAN:    In order of problems listed above:  1. Chronic systolic CHF (congestive heart failure) (HCC) -  Mixed  ischemic and nonischemic cardiomyopathy. Recent echocardiogram has demonstrated worsening LV function with an EF of 20%. His blood pressure limits further adjustments in his heart failure therapy. Currently his volume appears stable. His blood pressure is running somewhat low and he has been dizzy with standing.  -  Continue current dose of metoprolol, digoxin  -  Decrease valsartan to 40 mg daily  -  Arrange Limited echo in 3 months to recheck EF  2. Permanent atrial fibrillation (HCC) -  Rate controlled. Continue beta blocker and digoxin. He is not a candidate for long-term anticoagulation.  3. Coronary artery disease involving native coronary artery of native heart without angina pectoris -  Status post CABG in 2010. Myoview in 2016 negative for ischemia. He denies angina. Continue aspirin, beta blocker. Atorvastatin is no longer on his list. LDL in 4/18 was optimal. He will review his medicines when he gets home to see if used taking atorvastatin or not.  4. History of subdural hematoma - As noted, he is not a candidate for anticoagulation.  Dispo:  Return in about 3 months (around 07/25/2017) for Routine Follow Up, w/ Dr. [name].   Medication Adjustments/Labs and Tests Ordered: Current medicines are reviewed at length with the patient today.  Concerns regarding medicines are outlined above.  Orders/Tests:  Orders Placed This Encounter  Procedures  . EKG 12-Lead  . ECHOCARDIOGRAM LIMITED   Medication changes: Meds ordered this encounter  Medications  . valsartan (DIOVAN) 40 MG tablet    Sig: Take 1 tablet (40 mg total) by mouth daily.    Dispense:  30 tablet    Refill:  11    Order Specific Question:   Supervising Provider    Answer:   [name] [[id number]]   Signed, Richardson Dopp, PA-C  04/24/2017 10:44 AM    [facility]  [facility] [address], [place], [place]  [address] Phone: [phone]; Fax: [phone]

## 2017-04-24 NOTE — Patient Instructions (Addendum)
Medication Instructions:  1. DECREASE VALSARTAN TO 40 MG DAILY  Labwork: NONE ORDERED  Testing/Procedures: Your physician has requested that you have an LIMITED echocardiogram. THIS IS TO BE DONE IN 3 MONTHS 1 WEEK BEFORE APPT WITH DR. [name] IN 3 MONTHS. Echocardiography is a painless test that uses sound waves to create images of your heart. It provides your doctor with information about the size and shape of your heart and how well your heart's chambers and valves are working. This procedure takes approximately one hour. There are no restrictions for this procedure.    Follow-Up: DR. [name] 3 MONTHS; ECHO TO BE COMPLETED 1 WEEK BEFORE DR. [name] APPT  Any Other Special Instructions Will Be Listed Below (If Applicable).     If you need a refill on your cardiac medications before your next appointment, please call your pharmacy.

## 2017-05-06 ENCOUNTER — Ambulatory Visit (INDEPENDENT_AMBULATORY_CARE_PROVIDER_SITE_OTHER): Payer: Medicare Other | Admitting: Acute Care

## 2017-05-06 ENCOUNTER — Encounter: Payer: Self-pay | Admitting: Acute Care

## 2017-05-06 ENCOUNTER — Telehealth: Payer: Self-pay | Admitting: Acute Care

## 2017-05-06 DIAGNOSIS — R053 Chronic cough: Secondary | ICD-10-CM

## 2017-05-06 DIAGNOSIS — I2510 Atherosclerotic heart disease of native coronary artery without angina pectoris: Secondary | ICD-10-CM | POA: Diagnosis not present

## 2017-05-06 DIAGNOSIS — R0609 Other forms of dyspnea: Secondary | ICD-10-CM | POA: Diagnosis not present

## 2017-05-06 DIAGNOSIS — R911 Solitary pulmonary nodule: Secondary | ICD-10-CM

## 2017-05-06 DIAGNOSIS — J90 Pleural effusion, not elsewhere classified: Secondary | ICD-10-CM

## 2017-05-06 DIAGNOSIS — R05 Cough: Secondary | ICD-10-CM | POA: Diagnosis not present

## 2017-05-06 DIAGNOSIS — R0602 Shortness of breath: Secondary | ICD-10-CM

## 2017-05-06 MED ORDER — PANTOPRAZOLE SODIUM 20 MG PO TBEC
20.0000 mg | DELAYED_RELEASE_TABLET | Freq: Every day | ORAL | 0 refills | Status: DC
Start: 2017-05-06 — End: 2017-06-14

## 2017-05-06 MED ORDER — PANTOPRAZOLE SODIUM 40 MG PO TBEC: 40.0000 mg | DELAYED_RELEASE_TABLET | Freq: Every day | ORAL | 0 refills | Status: DC

## 2017-05-06 MED ORDER — TIOTROPIUM BROMIDE-OLODATEROL 2.5-2.5 MCG/ACT IN AERS: 2.0000 | INHALATION_SPRAY | Freq: Every day | RESPIRATORY_TRACT | 0 refills | Status: DC

## 2017-05-06 MED ORDER — ALBUTEROL SULFATE HFA 108 (90 BASE) MCG/ACT IN AERS: 2.0000 | INHALATION_SPRAY | RESPIRATORY_TRACT | 5 refills | Status: DC | PRN

## 2017-05-06 NOTE — Assessment & Plan Note (Signed)
For follow-up CT scan at [facility] 6 of 2018. Have requested patient have results of scan sent to [facility] for evaluation.

## 2017-05-06 NOTE — Patient Instructions (Addendum)
It is good to see you today. Please have [facility] send [place] a copy of the CT Chest you are having dome this month. We will schedule you for Pulmonary Function Tests We will walk you today to see if you qualify for oxygen. Start Protonix 20 mg once daily x 4 weeks. We will give you a copy of the GERD diet. We will renew your rescue inhaler prescription today. Complete the paperwork for financial assistance for [place] We will give you another sample today. Please follow up in 4 weeks with Dr. [name] or Eric Form, NP Please contact office for sooner follow up if symptoms do not improve or worsen or seek emergency care .

## 2017-05-06 NOTE — Assessment & Plan Note (Signed)
Follow-up CT scan scheduled for June 2018 at [facility]. Patient has been asked to send a copy of CT scan results to [facility]  pulmonary

## 2017-05-06 NOTE — Progress Notes (Signed)
History of Present Illness Alejandro [name]. is a 71 y.o. male  former smoker ( 50 pack years) with COPD, squamous cell cancer ( treated with chemoradiation) , and PE 10/2016. He is followed by Dr. [name].   05/06/2017 Follow Up Visit for therapeutic trial of  Stialto use.  Patient with history of COPD, squamous cell cancer that was treated with chemoradiation, and pulmonary embolism in 2017 presents for follow-up.He States he has had little to no relief with the Stialto trial.He states he is continuing to cough. ( He has not coughed in the office today) he states his cough is productive for scant amount of brown mucus. He states this is his baseline. He states he does not use his rescue inhaler at all.. Per his wife, his cough is slightly better on the [place]. He is sleeping in a recliner. He had a PE 10/2016. He was taken off is blood thinner by his oncologist due to brain bleed after a  fall in 2015.He felt risk  did not justify benefit. He has a repeat CT chest this month at [facility].We will request a copy. Last CT chest done 11/2016 at [facility]  indicates chronic pulmonary emboli, enlarged main pulmonary artery, small right pleural effusion, and post radiation fibrosis, all of which could be contributing to dyspnea. Per the patient and his wife his cough is worse when lying down after eating. He is currently not using his  PPI. Patient denies fever, chest pain, orthopnea, or hemoptysis.He is currently on valsartan 80 mg daily.  SOB could be 2/2 radiation fibrosis Could be 2/2 to chronic PE ( Nov. 2017) Wife has not completed the paperwork for [facility] assistance Not using rescue inhaler Depressed ? 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 2.Small right pleural effusion. 3.Similar mediastinal adenopathy. 4.Redemonstrated chronic  pulmonary emboli and enlarged main pulmonary artery. 5.Mildly thickened appearance the gallbladder may relate to underdistention  05/06/2017>> Ambulatory oximetry: He walked 2 laps, starting saturations was 100%. He did not drop below 98%. Walk was stopped secondary to deconditioning, not due to desaturations.  CBC Latest Ref Rng & Units 10/08/2016 08/26/2015 06/07/2015  WBC 4.0 - 10.5 K/uL 8.1 8.5 6.0  Hemoglobin 13.0 - 17.0 g/dL 13.8 11.8(L) 8.8(L)  Hematocrit 39.0 - 52.0 % 41.0 35.3(L) 26.3(L)  Platelets 150.0 - 400.0 K/uL 191.0 195.0 214    BMP Latest Ref Rng & Units 04/04/2017 03/29/2017 03/11/2017  Glucose 65 - 99 mg/dL 166(H) 129(H) 108(H)  BUN 8 - 27 mg/dL 13 10 14   Creatinine 0.76 - 1.27 mg/dL 1.22 1.14 1.10  BUN/Creat Ratio 10 - 24 11 9(L) 13  Sodium 134 - 144 mmol/L 132(L) 138 136  Potassium 3.5 - 5.2 mmol/L 3.9 4.1 3.7  Chloride 96 - 106 mmol/L 93(L) 97 96  CO2 18 - 29 mmol/L 23 24 21   Calcium 8.6 - 10.2 mg/dL 9.2 9.4 9.3    BNP    Component Value Date/Time   BNP 141.1 (H) 05/23/2015 1313    ProBNP    Component Value Date/Time   PROBNP 4,407 (H) 03/11/2017 1147   PROBNP 252.0 (H) 04/05/2015 1134     Past medical hx Past Medical History:  Diagnosis Date  . Alcohol abuse   . Atrial fibrillation ([place])   . CAD (coronary artery disease)    s/p CABGx4 on 12/24/2008  . Cancer ([place])  around nose.  New dx of lung cancer  . CHF (congestive heart failure) ([place])   . Dyslipidemia   . Dysrhythmia    atrial fib  . GERD (gastroesophageal reflux disease)   . H/O hiatal hernia   . HTN (hypertension)   . Myocardial infarction ([place])   . Non-small cell carcinoma of lung, stage 3 12/16/2014  . S/P chemotherapy, time since 4-12 weeks   . Shortness of breath    occ  . Stroke ([place]) 5/15  . Systolic heart failure ([place])   . Tobacco abuse      Social History  Substance Use Topics  . Smoking status: Former Smoker    Packs/day: 1.00    Years: 50.00    Types: Cigarettes     Quit date: 04/19/2013  . Smokeless tobacco: Never Used  . Alcohol use 4.2 oz/week    7 Cans of beer per week    Tobacco Cessation: Former smoker with a 50-pack-year smoking history. Quit date was 04/19/2013.  Past surgical hx, Family hx, Social hx all reviewed.  Current Outpatient Prescriptions on File Prior to Visit  Medication Sig  . aspirin 81 MG tablet Take 81 mg by mouth daily.  . digoxin (LANOXIN) 0.125 MG tablet Take 2 tablets (0.25 mg total) by mouth daily.  . furosemide (LASIX) 40 MG tablet Take 1 tablet (40 mg total) by mouth 2 (two) times daily.  Marland Kitchen HYDROcodone-homatropine (HYCODAN) 5-1.5 MG/5ML syrup Take 5 mLs by mouth 2 (two) times daily as needed for cough.  . lidocaine-prilocaine (EMLA) cream Apply 1 application topically as needed.  . metoprolol 75 MG TABS Take 75 mg by mouth 2 (two) times daily.  . Multiple Vitamin (MULTIVITAMIN WITH MINERALS) TABS tablet Take 1 tablet by mouth daily.  . potassium chloride (K-DUR) 10 MEQ tablet Take 1 tablet (10 mEq total) by mouth 2 (two) times daily.  . Tiotropium Bromide-Olodaterol (STIOLTO RESPIMAT) 2.5-2.5 MCG/ACT AERS Inhale 2 puffs into the lungs daily.  . valsartan (DIOVAN) 40 MG tablet Take 1 tablet (40 mg total) by mouth daily.   No current facility-administered medications on file prior to visit.      No Known Allergies  Review Of Systems:  Constitutional:   No  weight loss, night sweats,  Fevers, chills, fatigue, or  lassitude.  HEENT:   No headaches,  Difficulty swallowing,  Tooth/dental problems, or  Sore throat,                No sneezing, itching, ear ache, +nasal congestion, post nasal drip,   CV:  No chest pain,  Orthopnea, PND, swelling in lower extremities, anasarca, dizziness, palpitations, syncope.   GI  No heartburn, indigestion, abdominal pain, nausea, vomiting, diarrhea, change in bowel habits, loss of appetite, bloody stools.   Resp: + shortness of breath with exertion less at rest.  + excess mucus, +  productive cough,  No non-productive cough,  No coughing up of blood.  + change in color of mucus.  No wheezing.  No chest wall deformity  Skin: no rash or lesions.  GU: no dysuria, change in color of urine, no urgency or frequency.  No flank pain, no hematuria   MS:  No joint pain or swelling.  No decreased range of motion.  No back pain.  Psych:  No change in mood or affect. No depression or anxiety.  No memory loss.   Vital Signs BP 110/76 (BP Location: Right Arm, Patient Position: Sitting, Cuff Size: Normal)   Pulse 88  Ht 5\' 8"  (1.727 m)   Wt 234 lb 9.6 oz (106.4 kg)   SpO2 92%   BMI 35.67 kg/m   Body mass index is 35.67 kg/m.  Physical Exam:  General- No distress,  A&Ox3, depressed appearing. Obese, deconditioned, appears much older than stated age ENT: No sinus tenderness, TM clear, pale nasal mucosa, no oral exudate,no post nasal drip, no LAN Cardiac: S1, S2, regular rate and rhythm, no murmur Chest: No wheeze/ rales/ dullness; no accessory muscle use, no nasal flaring, no sternal retractions Abd.: Soft Non-tender, obese Ext: No clubbing cyanosis, edema Neuro:  Deconditioned at baseline Skin: No rashes, warm and dry Psych: flat affect   Assessment/Plan  Nodule of right lung Follow-up CT scan scheduled for June 2018 at [facility]. Patient has been asked to send a copy of CT scan results to [place]  pulmonary  Pleural effusion, right For follow-up CT scan at [facility] 6 of 2018. Have requested patient have results of scan sent to [facility] for evaluation.  Chronic cough Start Protonix 20 mg once daily x 4 weeks. We will give you a copy of the GERD diet. Continue valsartan for blood pressure. Please follow up in 4 weeks with Dr. [name] or Eric Form, NP Please contact office for sooner follow up if symptoms do not improve or worsen or seek emergency care .   Dyspnea on exertion Continued dyspnea on  exertion Denies chest pain or tightness Plan Please have [facility] send [place] a copy of the CT Chest you are having dome this month. We will schedule you for Pulmonary Function Tests We will walk you today to see if you qualify for oxygen. We will renew your rescue inhaler prescription today. Complete the paperwork for financial assistance for [facility] We will give you another sample today. Please follow up in 4 weeks with Dr. [name] or Eric Form, NP Please contact office for sooner follow up if symptoms do not improve or worsen or seek emergency care .     Magdalen Spatz, NP 05/06/2017  8:16 PM

## 2017-05-06 NOTE — Telephone Encounter (Signed)
Alejandro [name] ° °

## 2017-05-06 NOTE — Assessment & Plan Note (Signed)
Continued dyspnea on exertion Denies chest pain or tightness Plan Please have [facility] send [place] a copy of the CT Chest you are having dome this month. We will schedule you for Pulmonary Function Tests We will walk you today to see if you qualify for oxygen. We will renew your rescue inhaler prescription today. Complete the paperwork for financial assistance for [employer] We will give you another sample today. Please follow up in 4 weeks with Dr. [name] or Eric Form, NP Please contact office for sooner follow up if symptoms do not improve or worsen or seek emergency care .

## 2017-05-06 NOTE — Assessment & Plan Note (Addendum)
Start Protonix 20 mg once daily x 4 weeks. We will give you a copy of the GERD diet. Continue valsartan for blood pressure. Please follow up in 4 weeks with Dr. [name] or Eric Form, NP Please contact office for sooner follow up if symptoms do not improve or worsen or seek emergency care .

## 2017-05-20 DIAGNOSIS — J90 Pleural effusion, not elsewhere classified: Secondary | ICD-10-CM | POA: Diagnosis not present

## 2017-05-20 DIAGNOSIS — C3491 Malignant neoplasm of unspecified part of right bronchus or lung: Secondary | ICD-10-CM | POA: Diagnosis not present

## 2017-05-20 DIAGNOSIS — Z923 Personal history of irradiation: Secondary | ICD-10-CM | POA: Diagnosis not present

## 2017-05-20 DIAGNOSIS — J432 Centrilobular emphysema: Secondary | ICD-10-CM | POA: Diagnosis not present

## 2017-05-20 DIAGNOSIS — I7090 Unspecified atherosclerosis: Secondary | ICD-10-CM | POA: Diagnosis not present

## 2017-05-20 DIAGNOSIS — R590 Localized enlarged lymph nodes: Secondary | ICD-10-CM | POA: Diagnosis not present

## 2017-05-20 DIAGNOSIS — J811 Chronic pulmonary edema: Secondary | ICD-10-CM | POA: Diagnosis not present

## 2017-05-20 DIAGNOSIS — Z951 Presence of aortocoronary bypass graft: Secondary | ICD-10-CM | POA: Diagnosis not present

## 2017-05-23 DIAGNOSIS — Z85118 Personal history of other malignant neoplasm of bronchus and lung: Secondary | ICD-10-CM | POA: Diagnosis not present

## 2017-05-23 DIAGNOSIS — C3411 Malignant neoplasm of upper lobe, right bronchus or lung: Secondary | ICD-10-CM | POA: Diagnosis not present

## 2017-05-25 DIAGNOSIS — J441 Chronic obstructive pulmonary disease with (acute) exacerbation: Secondary | ICD-10-CM | POA: Diagnosis not present

## 2017-05-25 DIAGNOSIS — R918 Other nonspecific abnormal finding of lung field: Secondary | ICD-10-CM | POA: Diagnosis not present

## 2017-05-25 DIAGNOSIS — Z6836 Body mass index (BMI) 36.0-36.9, adult: Secondary | ICD-10-CM | POA: Diagnosis not present

## 2017-05-25 DIAGNOSIS — R0602 Shortness of breath: Secondary | ICD-10-CM | POA: Diagnosis not present

## 2017-05-25 DIAGNOSIS — Z7982 Long term (current) use of aspirin: Secondary | ICD-10-CM | POA: Diagnosis not present

## 2017-05-25 DIAGNOSIS — I130 Hypertensive heart and chronic kidney disease with heart failure and stage 1 through stage 4 chronic kidney disease, or unspecified chronic kidney disease: Secondary | ICD-10-CM | POA: Diagnosis present

## 2017-05-25 DIAGNOSIS — E118 Type 2 diabetes mellitus with unspecified complications: Secondary | ICD-10-CM | POA: Diagnosis not present

## 2017-05-25 DIAGNOSIS — Z86711 Personal history of pulmonary embolism: Secondary | ICD-10-CM | POA: Diagnosis not present

## 2017-05-25 DIAGNOSIS — Z79899 Other long term (current) drug therapy: Secondary | ICD-10-CM | POA: Diagnosis not present

## 2017-05-25 DIAGNOSIS — J449 Chronic obstructive pulmonary disease, unspecified: Secondary | ICD-10-CM | POA: Diagnosis not present

## 2017-05-25 DIAGNOSIS — E669 Obesity, unspecified: Secondary | ICD-10-CM | POA: Diagnosis present

## 2017-05-25 DIAGNOSIS — C3490 Malignant neoplasm of unspecified part of unspecified bronchus or lung: Secondary | ICD-10-CM | POA: Diagnosis not present

## 2017-05-25 DIAGNOSIS — I4891 Unspecified atrial fibrillation: Secondary | ICD-10-CM | POA: Diagnosis not present

## 2017-05-25 DIAGNOSIS — J9621 Acute and chronic respiratory failure with hypoxia: Secondary | ICD-10-CM | POA: Diagnosis not present

## 2017-05-25 DIAGNOSIS — M7989 Other specified soft tissue disorders: Secondary | ICD-10-CM | POA: Diagnosis not present

## 2017-05-25 DIAGNOSIS — Z86718 Personal history of other venous thrombosis and embolism: Secondary | ICD-10-CM | POA: Diagnosis not present

## 2017-05-25 DIAGNOSIS — R600 Localized edema: Secondary | ICD-10-CM | POA: Diagnosis not present

## 2017-05-25 DIAGNOSIS — I509 Heart failure, unspecified: Secondary | ICD-10-CM | POA: Diagnosis not present

## 2017-05-25 DIAGNOSIS — I5023 Acute on chronic systolic (congestive) heart failure: Secondary | ICD-10-CM | POA: Diagnosis not present

## 2017-05-25 DIAGNOSIS — E1122 Type 2 diabetes mellitus with diabetic chronic kidney disease: Secondary | ICD-10-CM | POA: Diagnosis not present

## 2017-05-25 DIAGNOSIS — I482 Chronic atrial fibrillation: Secondary | ICD-10-CM | POA: Diagnosis not present

## 2017-05-25 DIAGNOSIS — I2699 Other pulmonary embolism without acute cor pulmonale: Secondary | ICD-10-CM | POA: Diagnosis not present

## 2017-05-25 DIAGNOSIS — N183 Chronic kidney disease, stage 3 (moderate): Secondary | ICD-10-CM | POA: Diagnosis not present

## 2017-05-25 DIAGNOSIS — R748 Abnormal levels of other serum enzymes: Secondary | ICD-10-CM | POA: Diagnosis present

## 2017-05-25 DIAGNOSIS — I2510 Atherosclerotic heart disease of native coronary artery without angina pectoris: Secondary | ICD-10-CM | POA: Diagnosis not present

## 2017-05-25 DIAGNOSIS — K7460 Unspecified cirrhosis of liver: Secondary | ICD-10-CM | POA: Diagnosis not present

## 2017-05-25 DIAGNOSIS — Z87891 Personal history of nicotine dependence: Secondary | ICD-10-CM | POA: Diagnosis not present

## 2017-05-25 DIAGNOSIS — C3481 Malignant neoplasm of overlapping sites of right bronchus and lung: Secondary | ICD-10-CM | POA: Diagnosis not present

## 2017-05-25 DIAGNOSIS — J811 Chronic pulmonary edema: Secondary | ICD-10-CM | POA: Diagnosis not present

## 2017-05-25 DIAGNOSIS — J90 Pleural effusion, not elsewhere classified: Secondary | ICD-10-CM | POA: Diagnosis not present

## 2017-05-25 DIAGNOSIS — J9601 Acute respiratory failure with hypoxia: Secondary | ICD-10-CM | POA: Diagnosis not present

## 2017-05-26 LAB — HEMOGLOBIN A1C: HEMOGLOBIN A1C: 7.2

## 2017-06-03 ENCOUNTER — Telehealth: Payer: Self-pay | Admitting: Behavioral Health

## 2017-06-03 ENCOUNTER — Telehealth: Payer: Self-pay | Admitting: *Deleted

## 2017-06-03 NOTE — Telephone Encounter (Signed)
Received Physician Orders from [facility] for Certificate of Medical Necessity [[id number]] - Oxygen, forwarded to provider/SLS 07/02

## 2017-06-03 NOTE — Telephone Encounter (Signed)
Attempted to reach patient for TCM/Hospital Follow-up call. Left message for patient to return call when available.    

## 2017-06-04 NOTE — Telephone Encounter (Signed)
Completed forms signed and faxed to [place] at [phone]. Forms sent for scanning.

## 2017-06-04 NOTE — Telephone Encounter (Signed)
Left message x 2 for TCM/Hospital Follow-up call. 

## 2017-06-06 NOTE — Telephone Encounter (Signed)
Unable to reach patient at this time. Left message for a callback.

## 2017-06-07 NOTE — Telephone Encounter (Signed)
Attempted to reach patient/spouse regarding TCM/Hospital Follow-up; same number listed for both parties. Left detailed message on voicemail.

## 2017-06-10 NOTE — Telephone Encounter (Signed)
Unable to reach or get in contact with patient/caregiver. Left message for a return call.

## 2017-06-12 ENCOUNTER — Ambulatory Visit: Payer: Medicare Other | Admitting: Acute Care

## 2017-06-12 NOTE — Telephone Encounter (Signed)
Unable to reach patient. Left message for patient to return call when available.

## 2017-06-14 ENCOUNTER — Encounter: Payer: Self-pay | Admitting: Internal Medicine

## 2017-06-14 ENCOUNTER — Other Ambulatory Visit: Payer: Self-pay | Admitting: Acute Care

## 2017-06-20 ENCOUNTER — Ambulatory Visit: Payer: Medicare Other | Admitting: Internal Medicine

## 2017-07-15 ENCOUNTER — Encounter: Payer: Self-pay | Admitting: *Deleted

## 2017-07-26 ENCOUNTER — Other Ambulatory Visit: Payer: Self-pay

## 2017-07-26 ENCOUNTER — Ambulatory Visit (HOSPITAL_COMMUNITY): Payer: Medicare Other | Attending: Cardiovascular Disease

## 2017-07-26 DIAGNOSIS — I110 Hypertensive heart disease with heart failure: Secondary | ICD-10-CM | POA: Diagnosis not present

## 2017-07-26 DIAGNOSIS — I509 Heart failure, unspecified: Secondary | ICD-10-CM | POA: Diagnosis not present

## 2017-07-26 DIAGNOSIS — I219 Acute myocardial infarction, unspecified: Secondary | ICD-10-CM | POA: Diagnosis not present

## 2017-07-26 DIAGNOSIS — I5022 Chronic systolic (congestive) heart failure: Secondary | ICD-10-CM | POA: Diagnosis not present

## 2017-07-26 DIAGNOSIS — I4891 Unspecified atrial fibrillation: Secondary | ICD-10-CM | POA: Insufficient documentation

## 2017-07-26 DIAGNOSIS — I2510 Atherosclerotic heart disease of native coronary artery without angina pectoris: Secondary | ICD-10-CM | POA: Insufficient documentation

## 2017-07-26 DIAGNOSIS — J449 Chronic obstructive pulmonary disease, unspecified: Secondary | ICD-10-CM | POA: Diagnosis not present

## 2017-07-28 ENCOUNTER — Encounter: Payer: Self-pay | Admitting: Physician Assistant

## 2017-07-30 ENCOUNTER — Ambulatory Visit: Payer: Medicare Other | Admitting: Emergency Medicine

## 2017-08-02 ENCOUNTER — Encounter: Payer: Self-pay | Admitting: Cardiovascular Disease

## 2017-08-02 ENCOUNTER — Ambulatory Visit (INDEPENDENT_AMBULATORY_CARE_PROVIDER_SITE_OTHER): Payer: Medicare Other | Admitting: Cardiovascular Disease

## 2017-08-02 VITALS — BP 132/60 | HR 84 | Ht 68.0 in | Wt 241.8 lb

## 2017-08-02 DIAGNOSIS — I482 Chronic atrial fibrillation: Secondary | ICD-10-CM | POA: Diagnosis not present

## 2017-08-02 DIAGNOSIS — I5023 Acute on chronic systolic (congestive) heart failure: Secondary | ICD-10-CM

## 2017-08-02 DIAGNOSIS — I2510 Atherosclerotic heart disease of native coronary artery without angina pectoris: Secondary | ICD-10-CM | POA: Diagnosis not present

## 2017-08-02 DIAGNOSIS — I4821 Permanent atrial fibrillation: Secondary | ICD-10-CM

## 2017-08-02 MED ORDER — TORSEMIDE 20 MG PO TABS: 40.0000 mg | ORAL_TABLET | Freq: Every day | ORAL | 11 refills | Status: DC

## 2017-08-02 MED ORDER — METOPROLOL SUCCINATE ER 50 MG PO TB24: ORAL_TABLET | ORAL | 11 refills | Status: DC

## 2017-08-02 MED ORDER — WARFARIN SODIUM 5 MG PO TABS: 5.0000 mg | ORAL_TABLET | Freq: Every day | ORAL | 0 refills | Status: DC

## 2017-08-02 NOTE — Progress Notes (Signed)
Cardiology Office Note Date:  08/02/2017   ID:  Alejandro Pingree., DOB [birth year], MRN [id number]  PCP:  Colon Branch, MD  Cardiologist:  [name], MD    Chief Complaint  Patient presents with  . Atrial Fibrillation    chronic     History of Present Illness: Alejandro Fritze. is a 71 y.o. male who presents for follow-up evaluation.   The patient has been followed for coronary artery disease with history of CABG in 2010 after he presented with heart failure and severe multivessel CAD. He's been followed for chronic systolic heart failure, permanent atrial fibrillation, hypertension, and hyperlipidemia. The patient had a prolonged hospitalization after he sustained a subdural hematoma in 2015. He required craniotomy and hematoma evacuation. The patient also has a history of lung cancer and has been treated with chemotherapy. He's had hospital admissions in 2016 with pneumonia and again with pulmonary emboli.  The patient has chronic shortness of breath with activity, worsening by his report today. No chest pain. He has had a cough and coughed up pinkish sputum yesterday per his wife. He complains of his legs giving out when he tries to walk. Symptoms occur with walking very short distance. Some shortness of breath with conversation. No orthopnea or PND.    The patient is here with his wife today and he has been followed by oncology here at [facility] and also had no vomiting. Those notes are reviewed today. He plans to have his port removed in the near future and is going to call the cancer center to arrange this. I reviewed his anticoagulation history. He was previously on Coumadin and had a traumatic subdural hematoma in 2015. Coumadin was not restarted. He later was on Eliquis because of a pulmonary embolus but this was stopped after 6 months and it became cost prohibitive. He has had no falls in the last year and he feels steady on his feet.  Past Medical History:  Diagnosis Date  .  Alcohol abuse   . Atrial fibrillation ([place])   . CAD (coronary artery disease)    s/p CABGx4 on 12/24/2008  . Cancer ([place])    around nose.  New dx of lung cancer  . CHF (congestive heart failure) ([place])   . Dilated cardiomyopathy ([place])    Echo 8/18: EF 20, diff HK, mod LAE, PASP 36  . Dyslipidemia   . Dysrhythmia    atrial fib  . GERD (gastroesophageal reflux disease)   . H/O hiatal hernia   . HTN (hypertension)   . Myocardial infarction ([place])   . Non-small cell carcinoma of lung, stage 3 12/16/2014  . S/P chemotherapy, time since 4-12 weeks   . Shortness of breath    occ  . Stroke ([place]) 5/15  . Systolic heart failure ([place])   . Tobacco abuse     Past Surgical History:  Procedure Laterality Date  . APPLICATION OF A-CELL OF CHEST/ABDOMEN N/A 01/22/2013   Procedure: APPLICATION OF A-CELL OF CHEST/ABDOMEN;  Surgeon: [name], MD;  Location: [place];  Service: General;  Laterality: N/A;  . CORONARY ARTERY BYPASS GRAFT  12/24/2008   4 vessel  . CORONARY ARTERY BYPASS GRAFT  2012?  . CRANIOTOMY Right 04/19/2014   Procedure: CRANIOTOMY HEMATOMA EVACUATION SUBDURAL;  Surgeon: [name], MD;  Location: [place] [facility]UR[facility];  Service: Neurosurgery;  Laterality: Right;  right  . CRANIOTOMY Right 04/23/2014   Procedure: Craniotomy for Intracerebral Hemorrhage;  Surgeon: [name],  MD;  Location: [place] NEUR[facility];  Service: Neurosurgery;  Laterality: Right;  . CRANIOTOMY N/A 06/16/2014   Procedure: Craniotomy for intracerebral abscess and subdural empyema;  Surgeon: [name], MD;  Location: [place] NEUR[facility] ORS;  Service: Neurosurgery;  Laterality: N/A;  . CRANIOTOMY N/A 09/13/2014   Procedure: CRANIOTOMY BONE FLAP/PROSTHETIC PLATE;  Surgeon: [name], MD;  Location: [place] NEURO ORS;  Service: Neurosurgery;  Laterality: N/A;  . CYSTOSCOPY N/A 01/22/2013   Procedure: CYSTOSCOPY;  Surgeon: [name], MD;  Location: [place];  Service: Urology;  Laterality: N/A;  Cystoscopy with balloon dilation. Insertion  of coude catheter.  Marland Kitchen HERNIA REPAIR  2012  . UMBILICAL HERNIA REPAIR  2010  . VENTRAL HERNIA REPAIR N/A 01/22/2013   Procedure: HERNIA REPAIR VENTRAL ADULT;  Surgeon: [name], MD;  Location: [place];  Service: General;  Laterality: N/A;  . VIDEO BRONCHOSCOPY WITH ENDOBRONCHIAL ULTRASOUND N/A 11/09/2014   Procedure: VIDEO BRONCHOSCOPY WITH ENDOBRONCHIAL ULTRASOUND;  Surgeon: Collene Gobble, MD;  Location: [place] [facility];  Service: Thoracic;  Laterality: N/A;    Current Outpatient Prescriptions  Medication Sig Dispense Refill  . albuterol (PROVENTIL HFA;VENTOLIN HFA) 108 (90 Base) MCG/ACT inhaler Inhale 2 puffs into the lungs every 4 (four) hours as needed for wheezing or shortness of breath. 8 g 5  . aspirin 81 MG tablet Take 81 mg by mouth daily.    . digoxin (LANOXIN) 0.125 MG tablet Take 2 tablets (0.25 mg total) by mouth daily. 180 tablet 3  . HYDROcodone-homatropine (HYCODAN) 5-1.5 MG/5ML syrup Take 5 mLs by mouth 2 (two) times daily as needed for cough. 240 mL 0  . lidocaine-prilocaine (EMLA) cream Apply 1 application topically as directed.    . Multiple Vitamin (MULTIVITAMIN WITH MINERALS) TABS tablet Take 1 tablet by mouth daily.    . pantoprazole (PROTONIX) 20 MG tablet TAKE 1 TABLET BY MOUTH DAILY 45 tablet 0  . metoprolol succinate (TOPROL XL) 50 MG 24 hr tablet Take 1.5 tablets (75 mg) twice daily. 90 tablet 11  . torsemide (DEMADEX) 20 MG tablet Take 2 tablets (40 mg total) by mouth daily. 60 tablet 11  . warfarin (COUMADIN) 5 MG tablet Take 1 tablet (5 mg total) by mouth daily. 10 tablet 0   No current facility-administered medications for this visit.     Allergies:   Patient has no known allergies.   Social History:  The patient  reports that he quit smoking about 4 years ago. His smoking use included Cigarettes. He has a 50.00 pack-year smoking history. He has never used smokeless tobacco. He reports that he drinks about 4.2 oz of alcohol per week . He reports that he does not  use drugs.   Family History:  The patient's  family history includes Diabetes in his father and mother; Heart attack in his brother, father, and mother; Heart disease in his brother, father, and mother; Rheum arthritis in his daughter.    ROS:  Please see the history of present illness.  Otherwise, review of systems is positive for Leg swelling, fatigue.  All other systems are reviewed and negative.    PHYSICAL EXAM: VS:  BP 132/60   Pulse 84   Ht 5\' 8"  (1.727 m)   Wt 241 lb 12.8 oz (109.7 kg)   BMI 36.77 kg/m  , BMI Body mass index is 36.77 kg/m. GEN: pleasant obese male, in no acute distress  HEENT: normal  Neck: positive JVD, no masses. No carotid bruits Cardiac:  irregularly irregular without murmur or gallop      Respiratory:  clear to auscultation bilaterally, normal work of breathing GI: soft, nontender, nondistended, + BS MS: no deformity or atrophy  Ext: 1+ bilateral pretibial edema Skin: warm and dry, no rash Neuro:  Strength and sensation are intact Psych: euthymic mood, full affect  EKG:  EKG is not ordered today.  Recent Labs: 10/08/2016: Hemoglobin 13.8; Platelets 191.0 02/18/2017: TSH 2.77 03/11/2017: ALT 26; NT-Pro BNP 4,407 04/04/2017: BUN 13; Creatinine, Ser 1.22; Potassium 3.9; Sodium 132   Lipid Panel     Component Value Date/Time   CHOL 105 03/29/2017 1005   TRIG 103 03/29/2017 1005   HDL 29 (L) 03/29/2017 1005   CHOLHDL 3.6 03/29/2017 1005   CHOLHDL 9.0 05/24/2015 0630   VLDL 44 (H) 05/24/2015 0630   LDLCALC 55 03/29/2017 1005      Wt Readings from Last 3 Encounters:  08/02/17 241 lb 12.8 oz (109.7 kg)  05/06/17 234 lb 9.6 oz (106.4 kg)  04/24/17 237 lb 12.8 oz (107.9 kg)     Cardiac Studies Reviewed: 2D Echo 07/26/2017: Study Conclusions  - Left ventricle: The cavity size was mildly dilated. Wall   thickness was normal. The estimated ejection fraction was 20%.   Diffuse hypokinesis. - Left atrium: The atrium was moderately dilated. -  Pulmonary arteries: PA peak pressure: 36 mm Hg (S). - Impressions: Incomplete color flow of valves performed.  Impressions:  - Incomplete color flow of valves performed.  ASSESSMENT AND PLAN: 1.  Acute on chronic systolic heart failure, New York Heart Association functional class III symptoms. Suspect the patient's progressive shortness of breath is likely multifactorial but certainly in part due to heart failure. He does have some evidence of volume excess on exam. His LVEF remains severely depressed at 20% based on review of his most recent echo. We had a lengthy discussion about treatment options. I'm going to change him from metoprolol tartrate to metoprolol succinate. He has discontinued valsartan because of the drug recall. He is not currently on an ACE or ARB. For volume overload will change his diuretic from furosemide to torsemide. He will have a follow-up metabolic panel in 2 weeks. I will arrange follow-up with Alejandro [name] to continue with medicine titration after his labs are drawn in a few weeks. Will not start an ACE/ARB because of other changes in his medications as above. This would be a reasonable consideration if he tolerates his other medicines.  We discussed consideration of an ICD today. Considering his general medical illness, lung cancer, and other comorbid conditions I would not pursue this therapy. We discussed this openly and the patient and his wife agree.  2. Permanent atrial fibrillation: We discussed the pros and cons of anticoagulation at length. Direct oral anticoagulant drugs or not an option because of cost. He had a traumatic subdural hematoma several years ago but now seems to be very stable on his feet. He is at high risk of stroke with his severe cardiomyopathy and permanent atrial fibrillation. He is going to start back on warfarin. We'll start him on 5 mg daily and arrange follow-up in the Coumadin clinic next week.  3. Lung cancer: The patient will contact  the cancer center about his Port-A-Cath. He will be okay to interrupt warfarin for 5 days in order to have this removed when it is arranged.  4. Coronary artery disease, native vessel, without angina: The patient appears stable. When he becomes therapeutic on warfarin I would  anticipate stopping his aspirin. This can be addressed when he returns to see Alejandro [name] in a few weeks.  Current medicines are reviewed with the patient today.  The patient does not have concerns regarding medicines.  Labs/ tests ordered today include:   Orders Placed This Encounter  Procedures  . Basic Metabolic Panel (BMET)    Disposition:   FU 3-4 weeks with Alejandro Dopp, PA-C.   Deatra James, MD  08/02/2017 11:26 AM    [facility] [address], [place], [place]  [address] Phone: [phone]; Fax: [phone]

## 2017-08-02 NOTE — Patient Instructions (Addendum)
Medication Instructions:  1) STOP LASIX 2) START TORSEMIDE 40 mg daily 3) STOP METOPROLOL (Lopressor) 4) START TOPROL XL 75 mg TWICE DAILY 5) START COUMADIN 5 mg daily  Labwork: Your provider recommends that you return for lab work in: 2 weeks for lab work.   Testing/Procedures: None  Follow-Up: Your provider recommends that you schedule an appointment to establish in [place].  Your provider recommends that you schedule a follow-up appointment in 3-4 weeks with Richardson Dopp, PA.  Any Other Special Instructions Will Be Listed Below (If Applicable).     If you need a refill on your cardiac medications before your next appointment, please call your pharmacy.

## 2017-08-08 ENCOUNTER — Ambulatory Visit (INDEPENDENT_AMBULATORY_CARE_PROVIDER_SITE_OTHER): Payer: Medicare Other | Admitting: *Deleted

## 2017-08-08 DIAGNOSIS — Z5181 Encounter for therapeutic drug level monitoring: Secondary | ICD-10-CM | POA: Diagnosis not present

## 2017-08-08 DIAGNOSIS — I4891 Unspecified atrial fibrillation: Secondary | ICD-10-CM

## 2017-08-08 LAB — POCT INR: INR: 3.2

## 2017-08-08 NOTE — Patient Instructions (Addendum)
A full discussion of the nature of anticoagulants has been carried out.  A benefit risk analysis has been presented to the patient, so that they understand the justification for choosing anticoagulation at this time. The need for frequent and regular monitoring, precise dosage adjustment and compliance is stressed.  Side effects of potential bleeding are discussed.  The patient should avoid any OTC items containing aspirin or ibuprofen, and should avoid great swings in general diet.  Avoid alcohol consumption.  Call if any signs of abnormal bleeding.  Next PT/INR test in 1 week.   Start taking Coumadin in the evening.

## 2017-08-16 ENCOUNTER — Other Ambulatory Visit: Payer: Medicare Other

## 2017-08-16 ENCOUNTER — Telehealth: Payer: Self-pay | Admitting: Cardiovascular Disease

## 2017-08-16 ENCOUNTER — Other Ambulatory Visit: Payer: Self-pay | Admitting: *Deleted

## 2017-08-16 MED ORDER — WARFARIN SODIUM 5 MG PO TABS: 5.0000 mg | ORAL_TABLET | ORAL | 0 refills | Status: DC

## 2017-08-16 NOTE — Telephone Encounter (Signed)
Rx sent to pharmacy for Coumadin 5mg  #30

## 2017-08-16 NOTE — Telephone Encounter (Signed)
New Message   *STAT* If patient is at the pharmacy, call can be transferred to refill team.   1. Which medications need to be refilled? (please list name of each medication and dose if known) Coumadin   2. Which pharmacy/location (including street and city if local pharmacy) is medication to be sent to? [facility] [address]   3. Do they need a 30 day or 90 day supply? Until appt on 9/18

## 2017-08-20 ENCOUNTER — Other Ambulatory Visit: Payer: Medicare Other

## 2017-08-20 ENCOUNTER — Ambulatory Visit (INDEPENDENT_AMBULATORY_CARE_PROVIDER_SITE_OTHER): Payer: Medicare Other | Admitting: *Deleted

## 2017-08-20 DIAGNOSIS — I4891 Unspecified atrial fibrillation: Secondary | ICD-10-CM | POA: Diagnosis not present

## 2017-08-20 DIAGNOSIS — Z5181 Encounter for therapeutic drug level monitoring: Secondary | ICD-10-CM

## 2017-08-20 DIAGNOSIS — I482 Chronic atrial fibrillation: Secondary | ICD-10-CM | POA: Diagnosis not present

## 2017-08-20 DIAGNOSIS — I5023 Acute on chronic systolic (congestive) heart failure: Secondary | ICD-10-CM | POA: Diagnosis not present

## 2017-08-20 DIAGNOSIS — I4821 Permanent atrial fibrillation: Secondary | ICD-10-CM

## 2017-08-20 LAB — PROTIME-INR
INR: 6.9 (ref 0.8–1.2)
Prothrombin Time: 72.8 s — ABNORMAL HIGH (ref 9.1–12.0)

## 2017-08-20 LAB — POCT INR: INR: 7.6

## 2017-08-21 LAB — BASIC METABOLIC PANEL
BUN/Creatinine Ratio: 13 (ref 10–24)
BUN: 18 mg/dL (ref 8–27)
CALCIUM: 9.1 mg/dL (ref 8.6–10.2)
CHLORIDE: 101 mmol/L (ref 96–106)
CO2: 22 mmol/L (ref 20–29)
Creatinine, Ser: 1.35 mg/dL — ABNORMAL HIGH (ref 0.76–1.27)
GFR calc Af Amer: 60 mL/min/{1.73_m2} (ref >59)
GFR calc non Af Amer: 52 mL/min/{1.73_m2} — ABNORMAL LOW (ref >59)
GLUCOSE: 140 mg/dL — AB (ref 65–99)
POTASSIUM: 4.3 mmol/L (ref 3.5–5.2)
SODIUM: 138 mmol/L (ref 134–144)

## 2017-08-27 ENCOUNTER — Ambulatory Visit (INDEPENDENT_AMBULATORY_CARE_PROVIDER_SITE_OTHER): Payer: Medicare Other | Admitting: *Deleted

## 2017-08-27 DIAGNOSIS — I4891 Unspecified atrial fibrillation: Secondary | ICD-10-CM

## 2017-08-27 DIAGNOSIS — Z5181 Encounter for therapeutic drug level monitoring: Secondary | ICD-10-CM | POA: Diagnosis not present

## 2017-08-27 LAB — POCT INR: INR: 3.9

## 2017-08-30 ENCOUNTER — Encounter: Payer: Self-pay | Admitting: Physician Assistant

## 2017-08-30 ENCOUNTER — Ambulatory Visit
Admission: RE | Admit: 2017-08-30 | Discharge: 2017-08-30 | Disposition: A | Discharge status: Home / Self Care | Payer: Medicare Other | Source: Ambulatory Visit | Attending: Physician Assistant | Admitting: Physician Assistant

## 2017-08-30 ENCOUNTER — Ambulatory Visit (INDEPENDENT_AMBULATORY_CARE_PROVIDER_SITE_OTHER): Payer: Medicare Other | Admitting: Physician Assistant

## 2017-08-30 ENCOUNTER — Telehealth: Payer: Self-pay | Admitting: Physician Assistant

## 2017-08-30 ENCOUNTER — Telehealth: Payer: Self-pay

## 2017-08-30 ENCOUNTER — Telehealth: Payer: Self-pay | Admitting: *Deleted

## 2017-08-30 DIAGNOSIS — I482 Chronic atrial fibrillation: Secondary | ICD-10-CM | POA: Diagnosis not present

## 2017-08-30 DIAGNOSIS — R05 Cough: Secondary | ICD-10-CM | POA: Diagnosis not present

## 2017-08-30 DIAGNOSIS — I2510 Atherosclerotic heart disease of native coronary artery without angina pectoris: Secondary | ICD-10-CM

## 2017-08-30 DIAGNOSIS — R059 Cough, unspecified: Secondary | ICD-10-CM

## 2017-08-30 DIAGNOSIS — R0602 Shortness of breath: Secondary | ICD-10-CM | POA: Diagnosis not present

## 2017-08-30 DIAGNOSIS — F1010 Alcohol abuse, uncomplicated: Secondary | ICD-10-CM

## 2017-08-30 DIAGNOSIS — I5023 Acute on chronic systolic (congestive) heart failure: Secondary | ICD-10-CM

## 2017-08-30 DIAGNOSIS — C3411 Malignant neoplasm of upper lobe, right bronchus or lung: Secondary | ICD-10-CM | POA: Diagnosis not present

## 2017-08-30 DIAGNOSIS — I4821 Permanent atrial fibrillation: Secondary | ICD-10-CM

## 2017-08-30 LAB — BASIC METABOLIC PANEL
BUN/Creatinine Ratio: 11 (ref 10–24)
BUN: 15 mg/dL (ref 8–27)
CO2: 24 mmol/L (ref 20–29)
CREATININE: 1.38 mg/dL — AB (ref 0.76–1.27)
Calcium: 9 mg/dL (ref 8.6–10.2)
Chloride: 97 mmol/L (ref 96–106)
GFR calc Af Amer: 59 mL/min/{1.73_m2} — ABNORMAL LOW (ref >59)
GFR calc non Af Amer: 51 mL/min/{1.73_m2} — ABNORMAL LOW (ref >59)
Glucose: 132 mg/dL — ABNORMAL HIGH (ref 65–99)
Potassium: 3.5 mmol/L (ref 3.5–5.2)
SODIUM: 137 mmol/L (ref 134–144)

## 2017-08-30 LAB — PRO B NATRIURETIC PEPTIDE: NT-PRO BNP: 3179 pg/mL — AB (ref 0–376)

## 2017-08-30 MED ORDER — POTASSIUM CHLORIDE ER 10 MEQ PO TBCR: 10.0000 meq | EXTENDED_RELEASE_TABLET | Freq: Every day | ORAL | 3 refills | Status: DC

## 2017-08-30 MED ORDER — TORSEMIDE 20 MG PO TABS: 20.0000 mg | ORAL_TABLET | Freq: Two times a day (BID) | ORAL | Status: DC

## 2017-08-30 MED ORDER — METOLAZONE 2.5 MG PO TABS: 2.5000 mg | ORAL_TABLET | Freq: Every day | ORAL | 0 refills | Status: DC

## 2017-08-30 NOTE — Telephone Encounter (Signed)
DPR ok to s/w pt's wife Pamala Hurry who has been notified of lab results/findings by phone with verbal understanding. Pt's wife is agreeable to plan of care for the pt to start K+ 10 meq daily. Pt will see Brynda Rim. PA on 09/02/17.

## 2017-08-30 NOTE — Telephone Encounter (Signed)
-----   Message from Liliane Shi, [place] sent at 08/30/2017  2:24 PM EDT ----- Please call the patient. Chest x-ray demonstrates chronic changes but no acute findings. Continue current treatment plan. Please fax a copy of this study result to his PCP:  Colon Branch, MD  Thanks! Richardson Dopp, PA-C    08/30/2017 2:23 PM

## 2017-08-30 NOTE — Telephone Encounter (Signed)
Please call the patient and tell him that I would like him to stop his aspirin. Richardson Dopp, PA-C    08/30/2017 2:08 PM

## 2017-08-30 NOTE — Telephone Encounter (Signed)
Spoke with patient and advised of CXR results with verbal understanding. Patient had no questions. Will fax results to Colon Branch, MD.

## 2017-08-30 NOTE — Progress Notes (Signed)
Cardiology Office Note:    Date:  08/30/2017   ID:  Alejandro [name]., DOB Aug 04, 1946, MRN [id number]  PCP:  Colon Branch, MD  Cardiologist:Dr. [name]  Electrophysiologist: n/a Oncologist:Dr. Cleaster [facility]) Pulmonologist: Dr. [name]   Referring MD: Colon Branch, MD   Chief Complaint  Patient presents with  . Congestive Heart Failure    Follow-up    History of Present Illness:    [name] is a 71 y.o. male with a hx of CAD, status post CABG in 12/2008 (LIMA-LAD, SVG-diagonal, SVG-OM, SVG-PDA) ischemic/nonischemic cardiomyopathy, systolic CHF, HTN, HL, permanent atrial fibrillation, subdural hematoma 5/15 after a fall resulting in craniotomy/hematoma evacuation (followed by R craniotomy and partial temporal lobectomy - not an anticoagulation candidate), lung CA.  EF has been severely depressed in the past. MRI in 09/2013 demonstrated improved LV function with an EF of 52%.  He received chemotherapy (carboplatin and paclitaxel). Nuclear stress test in 4/16 was high risk with inferior wall and apical infarct but no ischemia with EF 24%. Follow-up echocardiogram demonstrated reduced LV function with EF 30-35%. Medical therapy was adjusted.   He was admitted in 6/16 with pneumonia and elevated troponins felt to be related to demand ischemia. Medical therapy was continued. He was admitted to [facility] in 7/16 with pulmonary emboli and was placed on Rivaroxaban for 1 year. His oncologist took him off of anticoagulation due to his prior history of subdural hematoma. He was followed by cardiology at [facility] (Dr. Eileen Stanford) for a while after this.  He returned to reestablish with [facility] in 4/18.  Echo in 4/18 demonstrated worsening LV function with an EF of 20%. Given his advanced comorbid illnesses, we elected for conservative management and continued medical therapy.  Last seen by Dr. [name] 8/18. Follow-up echo prior to that appointment demonstrated EF  20%. Patient was felt to be volume overloaded. His furosemide was changed to torsemide. He had been off of ARB since the recall of valsartan. His metoprolol tartrate was changed to metoprolol succinate for advancement heart failure therapy. He was felt to be at improved risk in regards to bleeding and placed on Coumadin for anticoagulation. He was not felt to be a candidate for ICD.  [name] returns for follow-up. He is here today with his wife. His weight is up 6 pounds since last seen. He continues to complain of shortness of breath and cough. He sleeps in a recliner chronically. He denies PND. He denies much change in his lower extremity edema. He denies increased abdominal girth. He denies chest discomfort. He denies syncope. He denies fevers. His cough has been productive with clear to brown sputum at times. He denies hemoptysis. He does note wheezing.  Prior CV studies:   The following studies were reviewed today:  Echocardiogram 07/26/17 EF 20, diffuse HK, moderate LAE, PASP 36  Echo 03/25/17 EF 20, diffuse HK, mildly dilated ascending aorta (39 mm) cyst, trivial MR, moderate LAE, mildly reduced RVSF, mild RAE, PASP 33   Echo 7/16 [facility])  Mild concentric LVH, EF 40-45, mild BAE, normal RVSF    Nuclear Study 04/05/15 High risk; inferior wall and apex infarct.  No ischemic noted.   EF 24%.   Echo 04/19/15 EF 30-35, trivial AI, mild MR, severe LAE, moderate RAE   LHC (12/2008):  Left main 95% LAD 90% circumflex marginal 70% EF 25-30%. => CABG   Echo (02/24/13):   Mild LVH, EF 30-35%, mild LAE,  mild RVE, mildly reduced RVSF   Cardiac MRI (09/08/13):    EF 52%, no hyper-enhancement or infarct in LV myocardium, mild to moderate LAE, mild MR   Past Medical History:  Diagnosis Date  . Alcohol abuse   . Atrial fibrillation ([place])   . CAD (coronary artery disease)    s/p CABGx4 on 12/24/2008  . Cancer ([place])    around nose.  New dx of lung cancer  . CHF (congestive heart  failure) ([place])   . Dilated cardiomyopathy ([place])    Echo 8/18: EF 20, diff HK, mod LAE, PASP 36  . Dyslipidemia   . Dysrhythmia    atrial fib  . GERD (gastroesophageal reflux disease)   . H/O hiatal hernia   . HTN (hypertension)   . Myocardial infarction ([place])   . Non-small cell carcinoma of lung, stage 3 12/16/2014  . S/P chemotherapy, time since 4-12 weeks   . Shortness of breath    occ  . Stroke ([place]) 5/15  . Systolic heart failure ([place])   . Tobacco abuse     Past Surgical History:  Procedure Laterality Date  . APPLICATION OF A-CELL OF CHEST/ABDOMEN N/A 01/22/2013   Procedure: APPLICATION OF A-CELL OF CHEST/ABDOMEN;  Surgeon: [name], MD;  Location: [place];  Service: General;  Laterality: N/A;  . CORONARY ARTERY BYPASS GRAFT  12/24/2008   4 vessel  . CORONARY ARTERY BYPASS GRAFT  2012?  . CRANIOTOMY Right 04/19/2014   Procedure: CRANIOTOMY HEMATOMA EVACUATION SUBDURAL;  Surgeon: [name], MD;  Location: [place] [facility]UR[facility]S;  Service: Neurosurgery;  Laterality: Right;  right  . CRANIOTOMY Right 04/23/2014   Procedure: Craniotomy for Intracerebral Hemorrhage;  Surgeon: [name], MD;  Location: [place] NEURO ORS;  Service: Neurosurgery;  Laterality: Right;  . CRANIOTOMY N/A 06/16/2014   Procedure: Craniotomy for intracerebral abscess and subdural empyema;  Surgeon: [name], MD;  Location: [place] NEURO ORS;  Service: Neurosurgery;  Laterality: N/A;  . CRANIOTOMY N/A 09/13/2014   Procedure: CRANIOTOMY BONE FLAP/PROSTHETIC PLATE;  Surgeon: [name], MD;  Location: [place] NEURO ORS;  Service: Neurosurgery;  Laterality: N/A;  . CYSTOSCOPY N/A 01/22/2013   Procedure: CYSTOSCOPY;  Surgeon: [name], MD;  Location: [place];  Service: Urology;  Laterality: N/A;  Cystoscopy with balloon dilation. Insertion of coude catheter.  Marland Kitchen HERNIA REPAIR  2012  . UMBILICAL HERNIA REPAIR  2010  . VENTRAL HERNIA REPAIR N/A 01/22/2013   Procedure: HERNIA REPAIR VENTRAL ADULT;  Surgeon: [name], MD;   Location: [place];  Service: General;  Laterality: N/A;  . VIDEO BRONCHOSCOPY WITH ENDOBRONCHIAL ULTRASOUND N/A 11/09/2014   Procedure: VIDEO BRONCHOSCOPY WITH ENDOBRONCHIAL ULTRASOUND;  Surgeon: Collene Gobble, MD;  Location: [facility];  Service: Thoracic;  Laterality: N/A;    Current Medications: Current Meds  Medication Sig  . albuterol (PROVENTIL HFA;VENTOLIN HFA) 108 (90 Base) MCG/ACT inhaler Inhale 2 puffs into the lungs every 4 (four) hours as needed for wheezing or shortness of breath.  Marland Kitchen aspirin 81 MG tablet Take 81 mg by mouth daily.  . digoxin (LANOXIN) 0.125 MG tablet Take 2 tablets (0.25 mg total) by mouth daily.  Marland Kitchen HYDROcodone-homatropine (HYCODAN) 5-1.5 MG/5ML syrup Take 5 mLs by mouth 2 (two) times daily as needed for cough.  . lidocaine-prilocaine (EMLA) cream Apply 1 application topically as directed.  . metoprolol succinate (TOPROL XL) 50 MG 24 hr tablet Take 1.5 tablets (75 mg) twice daily.  . Multiple Vitamin (  MULTIVITAMIN WITH MINERALS) TABS tablet Take 1 tablet by mouth daily.  . pantoprazole (PROTONIX) 20 MG tablet TAKE 1 TABLET BY MOUTH DAILY     Allergies:   Patient has no known allergies.   Social History   Social History  . Marital status: Married    Spouse name: N/A  . Number of children: 2  . Years of education: N/A   Occupational History  . [employer] - retired    Social History Main Topics  . Smoking status: Former Smoker    Packs/day: 1.00    Years: 50.00    Types: Cigarettes    Quit date: 04/19/2013  . Smokeless tobacco: Never Used  . Alcohol use 4.2 oz/week    7 Cans of beer per week  . Drug use: No  . Sexual activity: Not Asked   Other Topics Concern  . None   Social History Narrative   Lives in [place], [place] with wife.  Ambulates with a cane     Family Hx: The patient's family history includes Diabetes in his father and mother; Heart attack in his brother, father, and mother; Heart disease in his brother, father, and  mother; Rheum arthritis in his daughter. There is no history of Colon cancer, Prostate cancer, or Stroke.  ROS:   Please see the history of present illness.    Review of Systems  Cardiovascular: Positive for dyspnea on exertion.  Respiratory: Positive for cough.    All other systems reviewed and are negative.   EKGs/Labs/Other Test Reviewed:    EKG:  EKG is   ordered today.  The ekg ordered today demonstrates Atrial fib, HR 115, RAD, poor R-wave progression, IVCD, QTc 420 ms, similar to prior tracings  Recent Labs: 10/08/2016: Hemoglobin 13.8; Platelets 191.0 02/18/2017: TSH 2.77 03/11/2017: ALT 26; NT-Pro BNP 4,407 08/20/2017: BUN 18; Creatinine, Ser 1.35; Potassium 4.3; Sodium 138   Recent Lipid Panel Lab Results  Component Value Date/Time   CHOL 105 03/29/2017 10:05 AM   TRIG 103 03/29/2017 10:05 AM   HDL 29 (L) 03/29/2017 10:05 AM   CHOLHDL 3.6 03/29/2017 10:05 AM   CHOLHDL 9.0 05/24/2015 06:30 AM   LDLCALC 55 03/29/2017 10:05 AM    Physical Exam:    VS:  BP (!) 140/58   Pulse (!) 115   Ht 5\' 8"  (1.727 m)   Wt 247 lb 12.8 oz (112.4 kg)   SpO2 96%   BMI 37.68 kg/m     Wt Readings from Last 3 Encounters:  08/30/17 247 lb 12.8 oz (112.4 kg)  08/02/17 241 lb 12.8 oz (109.7 kg)  05/06/17 234 lb 9.6 oz (106.4 kg)     Physical Exam  Constitutional: He is oriented to person, place, and time. He appears well-developed and well-nourished.  HENT:  Head: Normocephalic and atraumatic.  Eyes: No scleral icterus.  Neck: JVD (JVP 7 cm) present.  Cardiovascular: An irregularly irregular rhythm present. Tachycardia present.  Exam reveals distant heart sounds.   No murmur heard. Pulmonary/Chest: He has decreased breath sounds. He has no rales.  Abdominal: He exhibits distension.  Musculoskeletal: He exhibits edema (1+ bilat edema).  Neurological: He is alert and oriented to person, place, and time.  Skin: Skin is warm and dry.  Psychiatric: He has a normal mood and affect.     ASSESSMENT:    1. Acute on chronic systolic heart failure ([place])   2. Permanent atrial fibrillation ([place])   3. Coronary artery disease involving native coronary artery of  native heart without angina pectoris   4. Alcohol abuse   5. Non-small cell carcinoma of lung, stage 3   6. Cough    PLAN:    In order of problems listed above:  1. Acute on chronic systolic heart failure (HCC)  EF 20.  NYHA 3.  He remains volume overloaded. His weight is actually up since last seen. At last visit, his furosemide was changed to torsemide. His heart rate is now elevated. I had a long discussion with the patient and his wife regarding options for volume management. His options include admission to the hospital today for IV diuresis versus adjustments in his oral diuretics at home and close follow-up next week. He would prefer to stay out of the hospital.  -  Continue torsemide 20 mg twice a day  -  Metolazone 2.5 mg 1 dose today  -  Repeat metolazone 2.5 mg in 2 days if needed  -  BMET today along with BNP  -  Repeat BMET next week  -  Follow-up with me Monday 09/02/17  -  Consider adding ARB once volume status improved  2. Permanent atrial fibrillation (HCC) His heart rate is now uncontrolled. I considered adjusting his beta blocker. However, I believe that his heart rate is driven by his volume excess. We will diurese him first and then try to adjust his rate controlling therapy at follow-up. Continue follow-up with Coumadin clinic for warfarin management.  3. Coronary artery disease involving native coronary artery of native heart without angina pectoris Status post CABG in 2010. Myoview in 2016 negative for ischemia. He denies angina. His INR is now therapeutic. He's actually had difficulty with it being supratherapeutic. I will contact him and stop his aspirin.  4. Alcohol abuse His wife stopped me before they left to tell me that the patient is drinking significant amounts of beer. She told me  that he drank three 24 pack cases of beer in a week recently. I did advise her that his volume status is likely being made worse by the water that is contained in the beer that he is drinking. It's also making it dangerous for him in regards to anticoagulation. She did not want me to speak to him about this today.  We may need to stop his anticoagulation if he continues to abuse alcohol.    5. Non-small cell carcinoma of lung, stage 3 Continue follow-up with oncology as planned.  6. Cough I suspect that his cough is related to heart failure. However, since he has noted some discolored sputum I will arrange a chest x-ray.   Dispo:  Return in 3 days (on 09/02/2017) for Close Follow Up, w/ Richardson Dopp, PA-C.   Medication Adjustments/Labs and Tests Ordered: Current medicines are reviewed at length with the patient today.  Concerns regarding medicines are outlined above.  Tests Ordered: Orders Placed This Encounter  Procedures  . DG Chest 2 View  . Basic metabolic panel  . Pro b natriuretic peptide  . Basic Metabolic Panel (BMET)  . EKG 12-Lead   Medication Changes: Meds ordered this encounter  Medications  . torsemide (DEMADEX) 20 MG tablet    Sig: Take 1 tablet (20 mg total) by mouth 2 (two) times daily.  . metolazone (ZAROXOLYN) 2.5 MG tablet    Sig: Take 1 tablet (2.5 mg total) by mouth daily.    Dispense:  2 tablet    Refill:  0    Signed, Richardson Dopp, PA-C  08/30/2017 2:12 PM  [facility] [address], [place], [place]  [address] Phone: [phone]; Fax: [phone]

## 2017-08-30 NOTE — Telephone Encounter (Signed)
Patient notified of recommendation per Richardson Dopp PAC to stop Aspirin. Patient verbalized understanding and agreeable to treatment plan.

## 2017-08-30 NOTE — Patient Instructions (Addendum)
Medication Instructions:  1. START METOLAZONE 2.5 MG 1 TABLET 30 MINUTES BEFORE EVENING DOSE OF TORSEMIDE TODAY;   YOU WILL REPEAT THIS AGAIN ON Sunday ONLY IF NOT FEELING BETTER.   IF YOU DO FEEL BETTER YOU WILL NOT TAKE THE METOLAZONE     Labwork: 1. TODAY BMET, PRO BNP  2. Monday 09/02/17 WILL NEED REPEAT BMET DUE TO [place]  Testing/Procedures: 1. CHEST X-RAY ; THIS IS TO BE DONE AT [facility]   Follow-Up: 1. SCOTT WEAVER, [facility] 09/02/17 @ 2:15  Any Other Special Instructions Will Be Listed Below (If Applicable).     If you need a refill on your cardiac medications before your next appointment, please call your pharmacy.

## 2017-09-02 ENCOUNTER — Ambulatory Visit (INDEPENDENT_AMBULATORY_CARE_PROVIDER_SITE_OTHER): Payer: Medicare Other | Admitting: Physician Assistant

## 2017-09-02 ENCOUNTER — Encounter: Payer: Self-pay | Admitting: Physician Assistant

## 2017-09-02 ENCOUNTER — Ambulatory Visit (INDEPENDENT_AMBULATORY_CARE_PROVIDER_SITE_OTHER): Payer: Medicare Other | Admitting: Pharmacist Clinician (PhC)/ Clinical Pharmacy Specialist

## 2017-09-02 VITALS — BP 112/58 | HR 124 | Ht 68.0 in | Wt 234.4 lb

## 2017-09-02 DIAGNOSIS — F1010 Alcohol abuse, uncomplicated: Secondary | ICD-10-CM | POA: Diagnosis not present

## 2017-09-02 DIAGNOSIS — I4891 Unspecified atrial fibrillation: Secondary | ICD-10-CM | POA: Diagnosis not present

## 2017-09-02 DIAGNOSIS — I482 Chronic atrial fibrillation: Secondary | ICD-10-CM | POA: Diagnosis not present

## 2017-09-02 DIAGNOSIS — I5023 Acute on chronic systolic (congestive) heart failure: Secondary | ICD-10-CM

## 2017-09-02 DIAGNOSIS — I4821 Permanent atrial fibrillation: Secondary | ICD-10-CM

## 2017-09-02 DIAGNOSIS — Z5181 Encounter for therapeutic drug level monitoring: Secondary | ICD-10-CM

## 2017-09-02 DIAGNOSIS — I2510 Atherosclerotic heart disease of native coronary artery without angina pectoris: Secondary | ICD-10-CM | POA: Diagnosis not present

## 2017-09-02 LAB — POCT INR: INR: 1.8

## 2017-09-02 MED ORDER — METOPROLOL SUCCINATE ER 100 MG PO TB24: 100.0000 mg | ORAL_TABLET | Freq: Two times a day (BID) | ORAL | 3 refills | Status: DC

## 2017-09-02 NOTE — Patient Instructions (Addendum)
Medication Instructions:  1. STOP COUMADIN  2. IN 5 DAYS ON 09/07/17 RESTART ASPIRIN 81 MG DAILY  3. INCREASE TOPROL XL 100 MG TWICE DAILY; NEW RX HAS BEEN SENT IN WITH THE NEW DIRECTIONS  Labwork: TODAY BMET  Testing/Procedures: NONE ORDERED TODAY  Follow-Up: 10/04/17 @ 12:15 WITH SCOTT WEAVER, PAC   Any Other Special Instructions Will Be Listed Below (If Applicable).     If you need a refill on your cardiac medications before your next appointment, please call your pharmacy.

## 2017-09-02 NOTE — Progress Notes (Signed)
Cardiology Office Note:    Date:  09/02/2017   ID:  Marcelline Mates., DOB [birth year], MRN [id number]  PCP:  Colon Branch, MD  Cardiologist:Dr. [name]  Electrophysiologist: n/a Oncologist:Dr. Cleaster [facility]) Pulmonologist: Dr. [name]   Referring MD: Colon Branch, MD   Chief Complaint  Patient presents with  . Congestive Heart Failure    Follow-up    History of Present Illness:    Akari Crysler. is a 71 y.o. male with a hx of CAD, status post CABG in 12/2008 (LIMA-LAD, SVG-diagonal, SVG-OM, SVG-PDA) ischemic/nonischemic cardiomyopathy, systolic CHF, HTN, HL, permanent atrial fibrillation, subdural hematoma 5/15 after a fall resulting in craniotomy/hematoma evacuation (followed by R craniotomy and partial temporal lobectomy - not an anticoagulation candidate), lung CA.EF has been severely depressed in the past. MRI in 09/2013 demonstrated improved LV function with an EF of 52%. He received chemotherapy (carboplatin and paclitaxel). Nuclear stress test in 4/16 was high risk with inferior wall and apical infarct but no ischemia with EF 24%. Follow-up echocardiogram demonstrated reduced LV function with EF 30-35%. Medical therapy was adjusted. He was admitted in 6/16 with pneumonia and elevated troponins felt to be related to demand ischemia. Medical therapy was continued. He was admitted to [facility] in 7/16 with pulmonary emboli and was placed on Rivaroxaban for 1 year. His oncologist took him off of anticoagulation due to his prior history of subdural hematoma. He was followed by cardiology at [facility] (Dr. Eileen Stanford) for a while after this. He returned to reestablish with [facility] in 4/18. Echo in 4/18 demonstrated worsening LV function with an EF of 20%. Given his advanced comorbid illnesses, we elected for conservative management and continued medical therapy.  He was seen by Dr. [name] in 8/18. He was volume overloaded and his diuretics were  adjusted. I saw him back last week. He remained volume overloaded.  I had him take a dose of metolazone and return today for close follow-up.  Mr. Cass returns for follow-up. He is here with his wife. He denies having chest discomfort. He feels that his breathing is somewhat better. As noted previously, he sleeps in a recliner. He denies paroxysmal nocturnal dyspnea. He denies significant LE edema. He denies syncope.  Prior CV studies:   The following studies were reviewed today:  Echocardiogram 07/26/17 EF 20, diffuse HK, moderate LAE, PASP 36  Echo 03/25/17 EF 20, diffuse HK, mildly dilated ascending aorta (39 mm) cyst, trivial MR, moderate LAE, mildly reduced RVSF, mild RAE, PASP 33  Echo 7/16 [facility])  Mild concentric LVH, EF 40-45, mild BAE, normal RVSF   Nuclear Study 04/05/15 High risk; inferior wall and apex infarct.No ischemic noted.EF 24%.  Echo 04/19/15 EF 30-35, trivial AI, mild MR, severe LAE, moderate RAE  LHC (12/2008): Left main 95% LAD 90% circumflex marginal 70% EF 25-30%. => CABG  Echo (02/24/13): Mild LVH, EF 30-35%, mild LAE, mild RVE, mildly reduced RVSF  Cardiac MRI (09/08/13): EF 52%, no hyper-enhancement or infarct in LV myocardium, mild to moderate LAE, mild MR  Past Medical History:  Diagnosis Date  . Alcohol abuse   . Atrial fibrillation ([place])   . CAD (coronary artery disease)    s/p CABGx4 on 12/24/2008  . Cancer ([place])    around nose.  New dx of lung cancer  . CHF (congestive heart failure) ([place])   . Dilated cardiomyopathy ([place])    Echo 8/18: EF 20, diff HK, mod LAE, PASP 36  .  Dyslipidemia   . Dysrhythmia    atrial fib  . GERD (gastroesophageal reflux disease)   . H/O hiatal hernia   . HTN (hypertension)   . Myocardial infarction ([place])   . Non-small cell carcinoma of lung, stage 3 12/16/2014  . S/P chemotherapy, time since 4-12 weeks   . Shortness of breath    occ  . Stroke ([place]) 5/15  . Systolic heart failure ([place])    . Tobacco abuse     Past Surgical History:  Procedure Laterality Date  . APPLICATION OF A-CELL OF CHEST/ABDOMEN N/A 01/22/2013   Procedure: APPLICATION OF A-CELL OF CHEST/ABDOMEN;  Surgeon: [name], MD;  Location: [place];  Service: General;  Laterality: N/A;  . CORONARY ARTERY BYPASS GRAFT  12/24/2008   4 vessel  . CORONARY ARTERY BYPASS GRAFT  2012?  . CRANIOTOMY Right 04/19/2014   Procedure: CRANIOTOMY HEMATOMA EVACUATION SUBDURAL;  Surgeon: [name], MD;  Location: [place] NEURO ORS;  Service: Neurosurgery;  Laterality: Right;  right  . CRANIOTOMY Right 04/23/2014   Procedure: Craniotomy for Intracerebral Hemorrhage;  Surgeon: [name], MD;  Location: [place] NEURO ORS;  Service: Neurosurgery;  Laterality: Right;  . CRANIOTOMY N/A 06/16/2014   Procedure: Craniotomy for intracerebral abscess and subdural empyema;  Surgeon: [name], MD;  Location: [place] NEURO ORS;  Service: Neurosurgery;  Laterality: N/A;  . CRANIOTOMY N/A 09/13/2014   Procedure: CRANIOTOMY BONE FLAP/PROSTHETIC PLATE;  Surgeon: [name], MD;  Location: [place] NEURO ORS;  Service: Neurosurgery;  Laterality: N/A;  . CYSTOSCOPY N/A 01/22/2013   Procedure: CYSTOSCOPY;  Surgeon: [name], MD;  Location: [place];  Service: Urology;  Laterality: N/A;  Cystoscopy with balloon dilation. Insertion of coude catheter.  Marland Kitchen HERNIA REPAIR  2012  . UMBILICAL HERNIA REPAIR  2010  . VENTRAL HERNIA REPAIR N/A 01/22/2013   Procedure: HERNIA REPAIR VENTRAL ADULT;  Surgeon: [name], MD;  Location: [place];  Service: General;  Laterality: N/A;  . VIDEO BRONCHOSCOPY WITH ENDOBRONCHIAL ULTRASOUND N/A 11/09/2014   Procedure: VIDEO BRONCHOSCOPY WITH ENDOBRONCHIAL ULTRASOUND;  Surgeon: Collene Gobble, MD;  Location[facility];  Service: Thoracic;  Laterality: N/A;    Current Medications: Current Meds  Medication Sig  . albuterol (PROVENTIL HFA;VENTOLIN HFA) 108 (90 Base) MCG/ACT inhaler Inhale 2 puffs into the lungs every 4 (four) hours as  needed for wheezing or shortness of breath.  Marland Kitchen aspirin 81 MG tablet Take 81 mg by mouth daily.  . digoxin (LANOXIN) 0.125 MG tablet Take 2 tablets (0.25 mg total) by mouth daily.  Marland Kitchen HYDROcodone-homatropine (HYCODAN) 5-1.5 MG/5ML syrup Take 5 mLs by mouth 2 (two) times daily as needed for cough.  . lidocaine-prilocaine (EMLA) cream Apply 1 application topically as directed.  . metolazone (ZAROXOLYN) 2.5 MG tablet Take 1 tablet (2.5 mg total) by mouth daily.  . Multiple Vitamin (MULTIVITAMIN WITH MINERALS) TABS tablet Take 1 tablet by mouth daily.  . pantoprazole (PROTONIX) 20 MG tablet TAKE 1 TABLET BY MOUTH DAILY  . potassium chloride (K-DUR) 10 MEQ tablet Take 1 tablet (10 mEq total) by mouth daily.  Marland Kitchen torsemide (DEMADEX) 20 MG tablet Take 1 tablet (20 mg total) by mouth 2 (two) times daily.  . [DISCONTINUED] metoprolol succinate (TOPROL XL) 50 MG 24 hr tablet Take 1.5 tablets (75 mg) twice daily.  . [DISCONTINUED] warfarin (COUMADIN) 5 MG tablet Take 1 tablet (5 mg total) by mouth as directed.     Allergies:   Patient  has no known allergies.   Social History   Social History  . Marital status: Married    Spouse name: N/A  . Number of children: 2  . Years of education: N/A   Occupational History  . [employer] - retired    Social History Main Topics  . Smoking status: Former Smoker    Packs/day: 1.00    Years: 50.00    Types: Cigarettes    Quit date: 04/19/2013  . Smokeless tobacco: Never Used  . Alcohol use 4.2 oz/week    7 Cans of beer per week  . Drug use: No  . Sexual activity: Not Asked   Other Topics Concern  . None   Social History Narrative   Lives in [place], [place] with wife.  Ambulates with a cane     Family Hx: The patient's family history includes Diabetes in his father and mother; Heart attack in his brother, father, and mother; Heart disease in his brother, father, and mother; Rheum arthritis in his daughter. There is no history of Colon  cancer, Prostate cancer, or Stroke.  ROS:   Please see the history of present illness.    Review of Systems  Constitution: Positive for chills and malaise/fatigue.  Eyes: Positive for visual disturbance.  Cardiovascular: Positive for dyspnea on exertion.  Respiratory: Positive for cough, shortness of breath and wheezing.   Hematologic/Lymphatic: Bruises/bleeds easily.  Musculoskeletal: Positive for joint pain and myalgias.   All other systems reviewed and are negative.   EKGs/Labs/Other Test Reviewed:    EKG:  EKG is  ordered today.  The ekg ordered today demonstrates Atrial fibrillation, HR 124  Recent Labs: 10/08/2016: Hemoglobin 13.8; Platelets 191.0 02/18/2017: TSH 2.77 03/11/2017: ALT 26 08/30/2017: BUN 15; Creatinine, Ser 1.38; NT-Pro BNP 3,179; Potassium 3.5; Sodium 137   Recent Lipid Panel Lab Results  Component Value Date/Time   CHOL 105 03/29/2017 10:05 AM   TRIG 103 03/29/2017 10:05 AM   HDL 29 (L) 03/29/2017 10:05 AM   CHOLHDL 3.6 03/29/2017 10:05 AM   CHOLHDL 9.0 05/24/2015 06:30 AM   LDLCALC 55 03/29/2017 10:05 AM    Physical Exam:    VS:  BP (!) 112/58   Pulse (!) 124   Ht 5\' 8"  (1.727 m)   Wt 234 lb 6.4 oz (106.3 kg)   SpO2 93%   BMI 35.64 kg/m     Wt Readings from Last 3 Encounters:  09/02/17 234 lb 6.4 oz (106.3 kg)  08/30/17 247 lb 12.8 oz (112.4 kg)  08/02/17 241 lb 12.8 oz (109.7 kg)     Physical Exam  Constitutional: He is oriented to person, place, and time. He appears well-developed and well-nourished. No distress.  HENT:  Head: Normocephalic.  Eyes: No scleral icterus.  Neck: No JVD present.  Cardiovascular: An irregularly irregular rhythm present. Tachycardia present.   No murmur heard. Pulmonary/Chest: Effort normal. He has no rales.  Abdominal: Soft.  Musculoskeletal: He exhibits no edema.  Neurological: He is alert and oriented to person, place, and time.  Skin: Skin is warm and dry.  Psychiatric: He has a normal mood and affect.     ASSESSMENT:    1. Acute on chronic systolic heart failure ([place])   2. Permanent atrial fibrillation ([place])   3. Coronary artery disease involving native coronary artery of native heart without angina pectoris   4. Alcohol abuse    PLAN:    In order of problems listed above:  1. Acute on chronic systolic heart  failure [facility])  He remains New York Heart Association class III. His volume has improved after 2 doses of metolazone 2.5 mg. His weight is down 13 pounds. He feels that his breathing has improved. His heart rate is uncontrolled. I suspect that this is still driving some of his symptoms.  -  Continue current dose of digoxin, torsemide.  -  Beta blocker needs to be adjusted for heart rate control.  -  Blood pressure will not currently tolerate the addition of ACE inhibitor or ARB  2. Permanent atrial fibrillation ([place]) - Plan: EKG 12-Lead Heart rate is uncontrolled. He is on max dose digoxin. Given his history of lung cancer he is not really a candidate for amiodarone. I will adjust his metoprolol succinate for better heart rate control. I discussed his alcohol use with him and his wife today. His INR has been labile since resuming Coumadin. I am concerned that his bleeding risk is too great. I reviewed this with Dr. [name] who agreed. Therefore, I feel that is safer for [place] to take him off of Coumadin. I did review with the patient and his wife today that his risk of stroke will remain great off of Coumadin. However, I feel that his bleeding risk is much higher.  -  Increase metoprolol succinate to 100 mg twice a day  -  Discontinue Coumadin  3. Coronary artery disease involving native coronary artery of native heart without angina pectoris  Status post CABG in 2010. Myoview in 2016 negative for ischemia. He denies angina. Continue beta blocker. Since we are stopping his Coumadin, he can resume aspirin 81 mg daily.  4. Alcohol abuse He notes that he drinks 2-3 beers a day. His wife  feels that it is much greater than that. As noted above, I am concerned that his significant alcohol intake will lead to significant risk of bleeding. He has a prior history of subdural hematoma in 2015. Therefore, as outlined above, I have decided to take him off Coumadin. We discussed the importance of discontinuing alcohol use as relates to his atrial fibrillation, cardiomyopathy and congestive heart failure.  Dispo:  Return in about 4 weeks (around 09/30/2017) for Routine Follow Up, w/ Richardson Dopp, PA-C.   Medication Adjustments/Labs and Tests Ordered: Current medicines are reviewed at length with the patient today.  Concerns regarding medicines are outlined above.  Tests Ordered: Orders Placed This Encounter  Procedures  . Basic Metabolic Panel (BMET)  . EKG 12-Lead   Medication Changes: Meds ordered this encounter  Medications  . metoprolol succinate (TOPROL-XL) 100 MG 24 hr tablet    Sig: Take 1 tablet (100 mg total) by mouth 2 (two) times daily. Take with or immediately following a meal.    Dispense:  180 tablet    Refill:  3    Signed, Richardson Dopp, PA-C  09/02/2017 5:08 PM    [facility] [address], [place], [place]  [address] Phone: [phone]; Fax: [phone]

## 2017-09-03 ENCOUNTER — Telehealth: Payer: Self-pay | Admitting: *Deleted

## 2017-09-03 DIAGNOSIS — I5022 Chronic systolic (congestive) heart failure: Secondary | ICD-10-CM

## 2017-09-03 LAB — BASIC METABOLIC PANEL
BUN/Creatinine Ratio: 15 (ref 10–24)
BUN: 28 mg/dL — AB (ref 8–27)
CALCIUM: 9.8 mg/dL (ref 8.6–10.2)
CHLORIDE: 90 mmol/L — AB (ref 96–106)
CO2: 33 mmol/L — AB (ref 20–29)
Creatinine, Ser: 1.87 mg/dL — ABNORMAL HIGH (ref 0.76–1.27)
GFR calc non Af Amer: 35 mL/min/{1.73_m2} — ABNORMAL LOW (ref >59)
GFR, EST AFRICAN AMERICAN: 41 mL/min/{1.73_m2} — AB (ref >59)
GLUCOSE: 105 mg/dL — AB (ref 65–99)
Potassium: 3.5 mmol/L (ref 3.5–5.2)
Sodium: 135 mmol/L (ref 134–144)

## 2017-09-03 MED ORDER — POTASSIUM CHLORIDE ER 10 MEQ PO TBCR: 20.0000 meq | EXTENDED_RELEASE_TABLET | Freq: Every day | ORAL | 3 refills | Status: DC

## 2017-09-03 NOTE — Telephone Encounter (Signed)
-----   Message from Liliane Shi, [place] sent at 09/03/2017  5:14 PM EDT ----- Please call the patient Kidney function is a little worse.  The potassium is low normal. PLAN:  1. Decrease Torsemide to 20 mg Once daily for 2 days. 2. After 2 days resume Torsemide 20 mg Twice daily 3. Increase K+ to 20 mEq Once daily  4. BMET 1 week Richardson Dopp, PA-C    09/03/2017 5:13 PM

## 2017-09-03 NOTE — Telephone Encounter (Signed)
DPR ok to s/w pt's wife who has been notified of lab results. Pt's wife advised of recommendations to decrease Torsemide to 20 mg once a day x 2 days then resume Torsemide 20 mg BID, increase K+ to 20 meq daily, bmet 09/10/17. Pt's wife is agreeable to plan of care for the pt and thanked me for my call today.

## 2017-09-10 ENCOUNTER — Other Ambulatory Visit: Payer: Medicare Other

## 2017-09-12 ENCOUNTER — Other Ambulatory Visit: Payer: Medicare Other | Admitting: *Deleted

## 2017-09-12 DIAGNOSIS — I5022 Chronic systolic (congestive) heart failure: Secondary | ICD-10-CM | POA: Diagnosis not present

## 2017-09-12 LAB — BASIC METABOLIC PANEL
BUN/Creatinine Ratio: 14 (ref 10–24)
BUN: 18 mg/dL (ref 8–27)
CHLORIDE: 95 mmol/L — AB (ref 96–106)
CO2: 28 mmol/L (ref 20–29)
Calcium: 9 mg/dL (ref 8.6–10.2)
Creatinine, Ser: 1.26 mg/dL (ref 0.76–1.27)
GFR calc Af Amer: 65 mL/min/{1.73_m2} (ref >59)
GFR calc non Af Amer: 57 mL/min/{1.73_m2} — ABNORMAL LOW (ref >59)
GLUCOSE: 210 mg/dL — AB (ref 65–99)
POTASSIUM: 3.5 mmol/L (ref 3.5–5.2)
SODIUM: 137 mmol/L (ref 134–144)

## 2017-09-13 ENCOUNTER — Telehealth: Payer: Self-pay | Admitting: *Deleted

## 2017-09-13 NOTE — Telephone Encounter (Signed)
-----   Message from Liliane Shi, [place] sent at 09/13/2017  2:20 PM EDT ----- Please call the patient Kidney function is improved.  The potassium is normal.  Glucose is high. Continue with current treatment plan. Richardson Dopp, PA-C   09/13/2017 2:19 PM

## 2017-09-13 NOTE — Telephone Encounter (Signed)
DPR ok to s/w pt's wife who has been notified of pt's lab results by phone with verbal understanding. Pt's glucose is high at 210. Will forward results to Dr. Larose Kells. Pt's wife thanked me for the call today.

## 2017-09-25 ENCOUNTER — Telehealth: Payer: Self-pay | Admitting: *Deleted

## 2017-09-25 NOTE — Telephone Encounter (Signed)
Reviewed with MD pt will need referral to be established as patient here.  Attempted to reach pt with above information, unable to contact or leave message, pt's voicemail full.

## 2017-09-25 NOTE — Telephone Encounter (Signed)
"  Pamala Hurry calling back about my husband.  Spoke with someone earlier about Dr. Julien Nordmann seeing my husband.  If Dr. Julien Nordmann can't see him do you have another oncologist who can?  Was going to [place] after we moved to [place] thinking this would be closer.  He will not be going back to [facility].  Last visit there was May 23, 2017.  Scans were done in June, we were told nothing new was found or growing but we don't want to wait until June 2019 for next CT scans.  We're ending our relationship with [facility] so Zayan needs an oncologist.  He's also concerned about the port-a-cath that was inserted in [place].  If he does not need it he'd like it removed.  It hasn't been flushed since June.  Return number [phone]."  Routing call information to collaborative nurse and provider for review.  Further patient communication through collaborative nurse.  This triage nurse shared provider will review requests.  Encouraged to communicate with [facility] desire to transfer services.  [facility] is a specialty office unable to accept self referrals.  Mina Marble will need to release records from 2016 through today and a referral may be needed.  "His biggest concern is having the port-a-cath removed if it's not needed.  [facility] mentioned it could be removed.  It was placed in [place]."     Last visit at [facility] with [facility] was 06-07-2015.  Most recent provider visit with Dr.Mohamed was 03-14-2015 after initial [facility] visit on 12-16-2014.  [facility] EMR imaging report for IR fluoro guide CV line for Power injectable port-a-cath placed at [facility] on 01-07-2015 to right IJ with tip in superior cavoatrial junction signed by Dr. Sandi Mariscal with [facility].

## 2017-10-01 ENCOUNTER — Ambulatory Visit (INDEPENDENT_AMBULATORY_CARE_PROVIDER_SITE_OTHER): Payer: Medicare Other | Admitting: Internal Medicine

## 2017-10-01 ENCOUNTER — Encounter: Payer: Self-pay | Admitting: Internal Medicine

## 2017-10-01 VITALS — BP 118/62 | HR 113 | Temp 98.2°F | Resp 14 | Ht 68.0 in | Wt 244.4 lb

## 2017-10-01 DIAGNOSIS — C3411 Malignant neoplasm of upper lobe, right bronchus or lung: Secondary | ICD-10-CM | POA: Diagnosis not present

## 2017-10-01 DIAGNOSIS — Z23 Encounter for immunization: Secondary | ICD-10-CM

## 2017-10-01 DIAGNOSIS — E118 Type 2 diabetes mellitus with unspecified complications: Secondary | ICD-10-CM | POA: Diagnosis not present

## 2017-10-01 DIAGNOSIS — E119 Type 2 diabetes mellitus without complications: Secondary | ICD-10-CM

## 2017-10-01 DIAGNOSIS — I482 Chronic atrial fibrillation: Secondary | ICD-10-CM

## 2017-10-01 DIAGNOSIS — I4821 Permanent atrial fibrillation: Secondary | ICD-10-CM

## 2017-10-01 DIAGNOSIS — I10 Essential (primary) hypertension: Secondary | ICD-10-CM

## 2017-10-01 DIAGNOSIS — I2510 Atherosclerotic heart disease of native coronary artery without angina pectoris: Secondary | ICD-10-CM

## 2017-10-01 HISTORY — DX: Type 2 diabetes mellitus without complications: E11.9

## 2017-10-01 LAB — CBC WITH DIFFERENTIAL/PLATELET
BASOS ABS: 0 10*3/uL (ref 0.0–0.1)
Basophils Relative: 0.4 % (ref 0.0–3.0)
Eosinophils Absolute: 0.2 10*3/uL (ref 0.0–0.7)
Eosinophils Relative: 2.1 % (ref 0.0–5.0)
HEMATOCRIT: 42.1 % (ref 39.0–52.0)
Hemoglobin: 14.2 g/dL (ref 13.0–17.0)
LYMPHS PCT: 14.6 % (ref 12.0–46.0)
Lymphs Abs: 1.3 10*3/uL (ref 0.7–4.0)
MCHC: 33.8 g/dL (ref 30.0–36.0)
MCV: 94.1 fl (ref 78.0–100.0)
MONOS PCT: 8.2 % (ref 3.0–12.0)
Monocytes Absolute: 0.8 10*3/uL (ref 0.1–1.0)
NEUTROS ABS: 6.9 10*3/uL (ref 1.4–7.7)
Neutrophils Relative %: 74.7 % (ref 43.0–77.0)
PLATELETS: 184 10*3/uL (ref 150.0–400.0)
RBC: 4.48 Mil/uL (ref 4.22–5.81)
RDW: 15.5 % (ref 11.5–15.5)
WBC: 9.2 10*3/uL (ref 4.0–10.5)

## 2017-10-01 LAB — HEMOGLOBIN A1C: Hgb A1c MFr Bld: 6.9 % — ABNORMAL HIGH (ref 4.6–6.5)

## 2017-10-01 NOTE — Progress Notes (Signed)
Pre visit review using our clinic review tool, if applicable. No additional management support is needed unless otherwise documented below in the visit note. 

## 2017-10-01 NOTE — Assessment & Plan Note (Signed)
DM:   A1c of 7.2 on 05/26/2017.  Recheck A1c HTN: Seems well controlled on Zaroxolyn, potassium, Demadex.  Last BMP satisfactory, check CBC Atrial fibrillation: Continue digoxin, Toprol, checking a digoxin level.  Heart rate this is slightly elevated today but home is usually 80 or 90.  No change. Lung cancer: Last visit with [facility] 05/2017, they rec port-o-cath removal.  Patient is somewhat hesitant b/c was rec to do next CT in 1 year, he liked to be checked sooner before he removes the port-o-cath; he now requests a referral to [facility] oncology in [place] where he lives;will do.  If he is not seen within the next 2 months he will call me,  I will refer him to surgery for consideration of cath removal. ETOH: Ongoing problem, recommend abstinence or moderation.  AA? Flu shot today RTC 4-5 months.

## 2017-10-01 NOTE — Patient Instructions (Signed)
GO TO THE LAB : Get the blood work     GO TO THE FRONT DESK Schedule your next appointment for a  4-5 months

## 2017-10-01 NOTE — Progress Notes (Signed)
Subjective:    Patient ID: Alejandro Mates., male    DOB: [birth year], 71 y.o.   MRN: [id number]  DOS:  10/01/2017 Type of visit - description : rov, here with his wife. Interval history: HTN: Good compliance w/ medication, BP today is very good Atrial fibrillation: Good compliance w/ medication, heart rate today is elevated, at home is usually 80 or 90s Lung cancer: Likes to change providers, last note from oncology reviewed. EtOH: Ongoing problem, drinks daily, heavy.   Review of Systems Denies fever chills No chest pain Difficulty breathing at baseline.  He has chronic orthopnea. No nausea or vomiting.   Past Medical History:  Diagnosis Date  . Alcohol abuse   . Atrial fibrillation ([place])   . CAD (coronary artery disease)    s/p CABGx4 on 12/24/2008  . Cancer ([place])    around nose.  New dx of lung cancer  . CHF (congestive heart failure) ([place])   . Diabetes mellitus, type II ([place]) 10/01/2017  . Dilated cardiomyopathy ([facility])    Echo 8/18: EF 20, diff HK, mod LAE, PASP 36  . Dyslipidemia   . Dysrhythmia    atrial fib  . GERD (gastroesophageal reflux disease)   . H/O hiatal hernia   . HTN (hypertension)   . Myocardial infarction ([place])   . Non-small cell carcinoma of lung, stage 3 12/16/2014  . S/P chemotherapy, time since 4-12 weeks   . Shortness of breath    occ  . Stroke ([place]) 5/15  . Systolic heart failure ([place])   . Tobacco abuse     Past Surgical History:  Procedure Laterality Date  . APPLICATION OF A-CELL OF CHEST/ABDOMEN N/A 01/22/2013   Procedure: APPLICATION OF A-CELL OF CHEST/ABDOMEN;  Surgeon: [name], MD;  Location: [facility];  Service: General;  Laterality: N/A;  . CORONARY ARTERY BYPASS GRAFT  12/24/2008   4 vessel  . CORONARY ARTERY BYPASS GRAFT  2012?  . CRANIOTOMY Right 04/19/2014   Procedure: CRANIOTOMY HEMATOMA EVACUATION SUBDURAL;  Surgeon: [name], MD;  Location: [facility];  Service: Neurosurgery;  Laterality: Right;  right  . CRANIOTOMY  Right 04/23/2014   Procedure: Craniotomy for Intracerebral Hemorrhage;  Surgeon: [name], MD;  Location: [facility];  Service: Neurosurgery;  Laterality: Right;  . 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 CRANIOTOMY N/A 09/13/2014   Procedure: CRANIOTOMY BONE FLAP/PROSTHETIC PLATE;  Surgeon: [name], MD;  Location: [facility];  Service: Neurosurgery;  Laterality: N/A;  . CYSTOSCOPY N/A 01/22/2013   Procedure: CYSTOSCOPY;  Surgeon: [name], MD;  Location: [facility];  Service: Urology;  Laterality: N/A;  Cystoscopy with balloon dilation. Insertion of coude catheter.  Marland Kitchen HERNIA REPAIR  2012  . UMBILICAL HERNIA REPAIR  2010  . VENTRAL HERNIA REPAIR N/A 01/22/2013   Procedure: HERNIA REPAIR VENTRAL ADULT;  Surgeon: [name], MD;  Location: [facility];  Service: General;  Laterality: N/A;  . VIDEO BRONCHOSCOPY WITH ENDOBRONCHIAL ULTRASOUND N/A 11/09/2014   Procedure: VIDEO BRONCHOSCOPY WITH ENDOBRONCHIAL ULTRASOUND;  Surgeon: Collene Gobble, MD;  Location: [facility];  Service: Thoracic;  Laterality: N/A;    Social History   Social History  . Marital status: Married    Spouse name: N/A  . Number of children: 2  . Years of education: N/A  Occupational History  . [employer] - retired    Social History Main Topics  . Smoking status: Former Smoker    Packs/day: 1.00    Years: 50.00    Types: Cigarettes    Quit date: 04/19/2013  . Smokeless tobacco: Never Used  . Alcohol use 4.2 oz/week    7 Cans of beer per week  . Drug use: No  . Sexual activity: Not on file   Other Topics Concern  . Not on file   Social History Narrative   Lives in [place], [place] with wife.  Ambulates with a cane      Allergies as of 10/01/2017   No Known Allergies     Medication List       Accurate as of 10/01/17  5:12 PM.  Always use your most recent med list.          albuterol 108 (90 Base) MCG/ACT inhaler Commonly known as:  PROVENTIL HFA;VENTOLIN HFA Inhale 2 puffs into the lungs every 4 (four) hours as needed for wheezing or shortness of breath.   aspirin 81 MG tablet Take 81 mg by mouth daily.   digoxin 0.125 MG tablet Commonly known as:  LANOXIN Take 2 tablets (0.25 mg total) by mouth daily.   HYDROcodone-homatropine 5-1.5 MG/5ML syrup Commonly known as:  HYCODAN Take 5 mLs by mouth 2 (two) times daily as needed for cough.   lidocaine-prilocaine cream Commonly known as:  EMLA Apply 1 application topically as directed.   metolazone 2.5 MG tablet Commonly known as:  ZAROXOLYN Take 1 tablet (2.5 mg total) by mouth daily.   metoprolol succinate 100 MG 24 hr tablet Commonly known as:  TOPROL-XL Take 1 tablet (100 mg total) by mouth 2 (two) times daily. Take with or immediately following a meal.   multivitamin with minerals Tabs tablet Take 1 tablet by mouth daily.   pantoprazole 20 MG tablet Commonly known as:  PROTONIX TAKE 1 TABLET BY MOUTH DAILY   potassium chloride 10 MEQ tablet Commonly known as:  K-DUR Take 2 tablets (20 mEq total) by mouth daily.   torsemide 20 MG tablet Commonly known as:  DEMADEX Take 1 tablet (20 mg total) by mouth 2 (two) times daily.          Objective:   Physical Exam BP 118/62 (BP Location: Left Arm, Patient Position: Sitting, Cuff Size: Normal)   Pulse (!) 113   Temp 98.2 F (36.8 C) (Oral)   Resp 14   Ht 5\' 8"  (1.727 m)   Wt 244 lb 6 oz (110.8 kg)   SpO2 96%   BMI 37.16 kg/m  General:   Well developed, well nourished . NAD.  HEENT:  Normocephalic . Face symmetric, atraumatic Lungs:  Decreased BS but otherwise clear Normal respiratory effort, no intercostal retractions, no accessory muscle use. Heart: irregularly irreg, R,  no murmur.  No pretibial edema bilaterally  Skin: Not pale. Not jaundice Neurologic:  alert & oriented X3.   Speech normal, gait appropriate for age and unassisted Psych--  Cognition and judgment appear intact.  Cooperative with normal attention span and concentration.  Behavior appropriate. No anxious or depressed appearing.      Assessment & Plan:   Assessment  DM HTN Dyslipidemia Cardiovascular:  Dr Quentin Cornwall --CAD, history of MI, CABG 2010 --Atrial fibrillation -- no coumadin candidate since 04-2014 --CHF H/o SDH,   F/u by intraparenchymal bleed and infection >>>>craniotomy 04/19/2014, 04/23/2014, 06/16/2014, 09/13/2014 On KEPPRA  Non-small cell lung cancer  Dx 12-2014, transfer care to [facility] ~ 06-2015, also seen at pulmonary- [facility] 10-2016 PE: per CT ([facility]) 2-20-017, seen again in CT 02-16-2016 GERD H/O  tobacco,  + EtOH Skin cancer, nose  PLAN: DM:   A1c of 7.2 on 05/26/2017.  Recheck A1c HTN: Seems well controlled on Zaroxolyn, potassium, Demadex.  Last BMP satisfactory, check CBC Atrial fibrillation: Continue digoxin, Toprol, checking a digoxin level.  Heart rate this is slightly elevated today but home is usually 80 or 90.  No change. Lung cancer: Last visit with [facility] 05/2017, they rec port-o-cath removal.  Patient is somewhat hesitant b/c was rec to do next CT in 1 year, he liked to be checked sooner before he removes the port-o-cath; he now requests a referral to [facility] oncology in [place] where he lives;will do.  If he is not seen within the next 2 months he will call me,  I will refer him to surgery for consideration of cath removal. ETOH: Ongoing problem, recommend abstinence or moderation.  AA? Flu shot today RTC 4-5 months.

## 2017-10-02 LAB — DIGOXIN LEVEL

## 2017-10-04 ENCOUNTER — Ambulatory Visit (INDEPENDENT_AMBULATORY_CARE_PROVIDER_SITE_OTHER): Payer: Medicare Other | Admitting: Physician Assistant

## 2017-10-04 ENCOUNTER — Encounter: Payer: Self-pay | Admitting: Physician Assistant

## 2017-10-04 VITALS — BP 124/60 | HR 117 | Ht 68.0 in | Wt 244.8 lb

## 2017-10-04 DIAGNOSIS — I4821 Permanent atrial fibrillation: Secondary | ICD-10-CM

## 2017-10-04 DIAGNOSIS — C3411 Malignant neoplasm of upper lobe, right bronchus or lung: Secondary | ICD-10-CM | POA: Diagnosis not present

## 2017-10-04 DIAGNOSIS — I482 Chronic atrial fibrillation: Secondary | ICD-10-CM | POA: Diagnosis not present

## 2017-10-04 DIAGNOSIS — I5022 Chronic systolic (congestive) heart failure: Secondary | ICD-10-CM

## 2017-10-04 DIAGNOSIS — I2510 Atherosclerotic heart disease of native coronary artery without angina pectoris: Secondary | ICD-10-CM

## 2017-10-04 MED ORDER — METOPROLOL SUCCINATE ER 100 MG PO TB24: ORAL_TABLET | ORAL | 3 refills | Status: DC

## 2017-10-04 NOTE — Patient Instructions (Signed)
Medication Instructions:  INCREASE Your Metoprolol Succinate (Toprol-XL) to 150 mg (1 and 1/2 tablets) in the morning and 100 mg (1 tablet) in the evening.  I sent a new prescription to the pharmacy for you.  Labwork: None   Testing/Procedures: None   Follow-Up: Dr. [name] in 3 months.   Any Other Special Instructions Will Be Listed Below (If Applicable). If you notice worsening shortness of breath or swelling, call for sooner follow up. Make sure you tell your oncologist about the blood in your sputum.  If you need a refill on your cardiac medications before your next appointment, please call your pharmacy.

## 2017-10-04 NOTE — Progress Notes (Signed)
Cardiology Office Note:    Date:  10/04/2017   ID:  Alejandro Mates., DOB Mar 20, 1946, MRN [id number]  PCP:  Colon Branch, MD  Cardiologist:Dr. [name]  Electrophysiologist: n/a Oncologist:Dr. Cleaster Corin [facility]) Pulmonologist: Dr. [name]    Referring MD: Colon Branch, MD   Chief Complaint  Patient presents with  . Follow-up    Heart failure, atrial fibrillation    History of Present Illness:    Alejandro Sedgwick. is a 71 y.o. male with a hx of coronary artery disease status post coronary artery bypass grafting in 12/2008, ischemic/nonischemic cardiomyopathy, systolic heart failure, permanent atrial fibrillation, prior subdural hematoma after a fall requiring craniotomy and hematoma evacuation in 2015, lung cancer, pulmonary embolism in July 2016 (Xarelto therapy x 1 year).  He underwent chemotherapy and radiation for his lung cancer.  His ejection fraction did demonstrate improvement by MRI in 2014 up to 52%.  However, he has had reduction in his ejection fraction since that time.  Most recent echocardiogram in August 2018 demonstrated an EF of 20%.  Given his comorbid illnesses, he is not felt to be a candidate for ICD.  He was ultimately placed back on Coumadin for stroke prophylaxis.  He has been seen recently with decompensated heart failure.  He is currently on torsemide.  Several weeks ago he was given a dose of metolazone with improvement in his symptoms.  Recently, it has been noted that he has been drinking alcohol heavily.  At his last visit 09/02/17, after review with Dr. [name], I took him off of Coumadin due to significant bleeding risk.  Alejandro [name] returns for follow-up.  He is here with his wife.  His breathing remains the same.  He notes dyspnea with mild activities.  He denies chest discomfort or syncope.  He sleeps in a recliner chronically.  He denies PND.  He has not had significant edema.  He has noted cough with scant blood in his sputum recently.  He sees an  oncologist at [facility] in a couple of weeks.  He notes fatigue.  Prior CV studies:   The following studies were reviewed today:  Echocardiogram 07/26/17 EF 20, diffuse HK, moderate LAE, PASP 36  Echo 03/25/17 EF 20, diffuse HK, mildly dilated ascending aorta (39 mm) cyst, trivial MR, moderate LAE, mildly reduced RVSF, mild RAE, PASP 33  Echo 7/16 [facility])  Mild concentric LVH, EF 40-45, mild BAE, normal RVSF   Nuclear Study 04/05/15 High risk; inferior wall and apex infarct.No ischemic noted.EF 24%.  Echo 04/19/15 EF 30-35, trivial AI, mild MR, severe LAE, moderate RAE  LHC (12/2008): Left main 95% LAD 90% circumflex marginal 70% EF 25-30%. => CABG  Echo (02/24/13): Mild LVH, EF 30-35%, mild LAE, mild RVE, mildly reduced RVSF  Cardiac MRI (09/08/13): EF 52%, no hyper-enhancement or infarct in LV myocardium, mild to moderate LAE, mild MR  Past Medical History:  Diagnosis Date  . Alcohol abuse   . Atrial fibrillation ([place])   . CAD (coronary artery disease)    s/p CABGx4 on 12/24/2008  . Cancer ([place])    around nose.  New dx of lung cancer  . CHF (congestive heart failure) ([place])   . Diabetes mellitus, type II ([place]) 10/01/2017  . Dilated cardiomyopathy ([place] [place])    Echo 8/18: EF 20, diff HK, mod LAE, PASP 36  . Dyslipidemia   . Dysrhythmia    atrial fib  . GERD (gastroesophageal reflux disease)   . H/O  hiatal hernia   . HTN (hypertension)   . Myocardial infarction ([place])   . Non-small cell carcinoma of lung, stage 3 12/16/2014  . S/P chemotherapy, time since 4-12 weeks   . Shortness of breath    occ  . Stroke ([place]) 5/15  . Systolic heart failure ([place])   . Tobacco abuse     Past Surgical History:  Procedure Laterality Date  . APPLICATION OF A-CELL OF CHEST/ABDOMEN N/A 01/22/2013   Procedure: APPLICATION OF A-CELL OF CHEST/ABDOMEN;  Surgeon: [name], MD;  Location: [facility];  Service: General;  Laterality: N/A;  . CORONARY ARTERY BYPASS GRAFT   12/24/2008   4 vessel  . CORONARY ARTERY BYPASS GRAFT  2012?  . CRANIOTOMY Right 04/19/2014   Procedure: CRANIOTOMY HEMATOMA EVACUATION SUBDURAL;  Surgeon: [name], MD;  Location: [facility];  Service: Neurosurgery;  Laterality: Right;  right  . CRANIOTOMY Right 04/23/2014   Procedure: Craniotomy for Intracerebral Hemorrhage;  Surgeon: [name], MD;  Location: [facility];  Service: Neurosurgery;  Laterality: Right;  . 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 CRANIOTOMY N/A 09/13/2014   Procedure: CRANIOTOMY BONE FLAP/PROSTHETIC PLATE;  Surgeon: [name], MD;  Location: [facility];  Service: Neurosurgery;  Laterality: N/A;  . CYSTOSCOPY N/A 01/22/2013   Procedure: CYSTOSCOPY;  Surgeon: [name], MD;  Location: [facility];  Service: Urology;  Laterality: N/A;  Cystoscopy with balloon dilation. Insertion of coude catheter.  Marland Kitchen HERNIA REPAIR  2012  . UMBILICAL HERNIA REPAIR  2010  . VENTRAL HERNIA REPAIR N/A 01/22/2013   Procedure: HERNIA REPAIR VENTRAL ADULT;  Surgeon: [name], MD;  Location: [facility];  Service: General;  Laterality: N/A;  . VIDEO BRONCHOSCOPY WITH ENDOBRONCHIAL ULTRASOUND N/A 11/09/2014   Procedure: VIDEO BRONCHOSCOPY WITH ENDOBRONCHIAL ULTRASOUND;  Surgeon: Collene Gobble, MD;  Location: [facility];  Service: Thoracic;  Laterality: N/A;    Current Medications: Current Meds  Medication Sig  . albuterol (PROVENTIL HFA;VENTOLIN HFA) 108 (90 Base) MCG/ACT inhaler Inhale 2 puffs into the lungs every 4 (four) hours as needed for wheezing or shortness of breath.  Marland Kitchen aspirin 81 MG tablet Take 81 mg by mouth daily.  . digoxin (LANOXIN) 0.125 MG tablet Take 2 tablets (0.25 mg total) by mouth daily.  Marland Kitchen HYDROcodone-homatropine (HYCODAN) 5-1.5 MG/5ML syrup Take 5 mLs by mouth 2 (two) times daily as needed for cough.  .  lidocaine-prilocaine (EMLA) cream Apply 1 application topically as directed.  . Multiple Vitamin (MULTIVITAMIN WITH MINERALS) TABS tablet Take 1 tablet by mouth daily.  . potassium chloride (K-DUR) 10 MEQ tablet Take 2 tablets (20 mEq total) by mouth daily.  Marland Kitchen torsemide (DEMADEX) 20 MG tablet Take 1 tablet (20 mg total) by mouth 2 (two) times daily.  . [DISCONTINUED] metoprolol succinate (TOPROL-XL) 100 MG 24 hr tablet Take 1 tablet (100 mg total) by mouth 2 (two) times daily. Take with or immediately following a meal.     Allergies:   Patient has no known allergies.   Social History  Substance Use Topics  . Smoking status: Former Smoker    Packs/day: 1.00    Years: 50.00    Types: Cigarettes    Quit date: 04/19/2013  . Smokeless tobacco: Never Used  . Alcohol use 4.2 oz/week    7 Cans  of beer per week     Family Hx: The patient's family history includes Diabetes in his father and mother; Heart attack in his brother, father, and mother; Heart disease in his brother, father, and mother; Rheum arthritis in his daughter. There is no history of Colon cancer, Prostate cancer, or Stroke.  ROS:   Please see the history of present illness.    Review of Systems  Cardiovascular: Positive for irregular heartbeat.  Respiratory: Positive for cough, hemoptysis and wheezing.   Musculoskeletal: Positive for back pain and joint pain.  Neurological: Positive for loss of balance.   All other systems reviewed and are negative.   EKGs/Labs/Other Test Reviewed:    EKG:  EKG is  ordered today.  The ekg ordered today demonstrates atrial fibrillation, HR 117  Recent Labs: 02/18/2017: TSH 2.77 03/11/2017: ALT 26 08/30/2017: NT-Pro BNP 3,179 09/12/2017: BUN 18; Creatinine, Ser 1.26; Potassium 3.5; Sodium 137 10/01/2017: Hemoglobin 14.2; Platelets 184.0   Recent Lipid Panel Lab Results  Component Value Date/Time   CHOL 105 03/29/2017 10:05 AM   TRIG 103 03/29/2017 10:05 AM   HDL 29 (L) 03/29/2017  10:05 AM   CHOLHDL 3.6 03/29/2017 10:05 AM   CHOLHDL 9.0 05/24/2015 06:30 AM   LDLCALC 55 03/29/2017 10:05 AM    Physical Exam:    VS:  BP 124/60   Pulse (!) 117   Ht 5\' 8"  (1.727 m)   Wt 244 lb 12.8 oz (111 kg)   SpO2 90%   BMI 37.22 kg/m     Wt Readings from Last 3 Encounters:  10/04/17 244 lb 12.8 oz (111 kg)  10/01/17 244 lb 6 oz (110.8 kg)  09/02/17 234 lb 6.4 oz (106.3 kg)     Physical Exam  Constitutional: He is oriented to person, place, and time. He appears well-developed and well-nourished. No distress.  HENT:  Head: Normocephalic and atraumatic.  Neck: No JVD present.  Cardiovascular: An irregularly irregular rhythm present. Tachycardia present.   No murmur heard. Pulmonary/Chest: Effort normal. He has no rales.  Abdominal: Soft.  Musculoskeletal: He exhibits edema (Trace ankle edema bilaterally).  Neurological: He is alert and oriented to person, place, and time.  Skin: Skin is warm and dry.  Psychiatric: He has a normal mood and affect.    ASSESSMENT:    1. Chronic systolic CHF (congestive heart failure) ([place])   2. Permanent atrial fibrillation ([place])   3. Coronary artery disease involving native coronary artery of native heart without angina pectoris   4. Non-small cell carcinoma of lung, stage 3    PLAN:    In order of problems listed above:  1.  Chronic systolic CHF (congestive heart failure) ([place]) New York Heart Association class III.  EF 20% by recent echocardiogram August 2018.  Overall, volume appears to be stable.  He needs better control of his heart rate.  I will adjust his beta-blocker.  Continue digoxin.  I will hold off on adding ACE inhibitor or ARB for now.  2.  Permanent atrial fibrillation ([facility])  Heart rate is uncontrolled.  Increase Toprol to 150 mg in the morning and 100 mg in the evening.  As noted, he is not a candidate for anticoagulation.  3.  Coronary artery disease involving native coronary artery of native heart without  angina pectoris History of CABG in 2010.  Myoview in 2016- for ischemia.  He is not having anginal symptoms.  Continue aspirin, beta-blocker.  4.  Non-small cell carcinoma of lung, stage 3 He  sees his oncologist at [facility] in a couple of weeks.  I have asked him to discuss his hemoptysis at that appointment.   Dispo:  Return in about 3 months (around 01/04/2018) for Routine Follow Up, w/ Dr. [name].   Medication Adjustments/Labs and Tests Ordered: Current medicines are reviewed at length with the patient today.  Concerns regarding medicines are outlined above.  Tests Ordered: Orders Placed This Encounter  Procedures  . EKG 12-Lead   Medication Changes: Meds ordered this encounter  Medications  . metoprolol succinate (TOPROL-XL) 100 MG 24 hr tablet    Sig: Take 1 and 1/2 in the morning (150 mg) and 1 tab (100 mg) in the evening.  Take with or immediately following a meal.    Dispense:  225 tablet    Refill:  3    Dose Increase    Order Specific Question:   Supervising Provider    Answer:   Lauree Chandler D [[id number]]    Signed, Richardson Dopp, PA-C  10/04/2017 1:43 PM    [facility] [place], [place], [place]  [address] Phone: [phone]; Fax: [phone]

## 2017-10-30 DIAGNOSIS — Z86711 Personal history of pulmonary embolism: Secondary | ICD-10-CM | POA: Diagnosis not present

## 2017-10-30 DIAGNOSIS — Z923 Personal history of irradiation: Secondary | ICD-10-CM | POA: Diagnosis not present

## 2017-10-30 DIAGNOSIS — Z87891 Personal history of nicotine dependence: Secondary | ICD-10-CM | POA: Diagnosis not present

## 2017-10-30 DIAGNOSIS — Z08 Encounter for follow-up examination after completed treatment for malignant neoplasm: Secondary | ICD-10-CM | POA: Diagnosis not present

## 2017-10-30 DIAGNOSIS — Z808 Family history of malignant neoplasm of other organs or systems: Secondary | ICD-10-CM | POA: Diagnosis not present

## 2017-10-30 DIAGNOSIS — Z9221 Personal history of antineoplastic chemotherapy: Secondary | ICD-10-CM | POA: Diagnosis not present

## 2017-10-30 DIAGNOSIS — I509 Heart failure, unspecified: Secondary | ICD-10-CM | POA: Diagnosis not present

## 2017-10-30 DIAGNOSIS — C3411 Malignant neoplasm of upper lobe, right bronchus or lung: Secondary | ICD-10-CM | POA: Diagnosis not present

## 2017-10-30 DIAGNOSIS — Z95828 Presence of other vascular implants and grafts: Secondary | ICD-10-CM | POA: Diagnosis not present

## 2017-10-30 DIAGNOSIS — Z85118 Personal history of other malignant neoplasm of bronchus and lung: Secondary | ICD-10-CM | POA: Diagnosis not present

## 2017-10-30 DIAGNOSIS — Z86718 Personal history of other venous thrombosis and embolism: Secondary | ICD-10-CM | POA: Diagnosis not present

## 2017-11-06 DIAGNOSIS — J90 Pleural effusion, not elsewhere classified: Secondary | ICD-10-CM | POA: Diagnosis not present

## 2017-11-06 DIAGNOSIS — C3490 Malignant neoplasm of unspecified part of unspecified bronchus or lung: Secondary | ICD-10-CM | POA: Diagnosis not present

## 2017-11-06 DIAGNOSIS — C3481 Malignant neoplasm of overlapping sites of right bronchus and lung: Secondary | ICD-10-CM | POA: Diagnosis not present

## 2017-11-06 DIAGNOSIS — R918 Other nonspecific abnormal finding of lung field: Secondary | ICD-10-CM | POA: Diagnosis not present

## 2017-11-06 DIAGNOSIS — R590 Localized enlarged lymph nodes: Secondary | ICD-10-CM | POA: Diagnosis not present

## 2017-11-07 DIAGNOSIS — I5189 Other ill-defined heart diseases: Secondary | ICD-10-CM | POA: Diagnosis not present

## 2017-11-07 DIAGNOSIS — R9389 Abnormal findings on diagnostic imaging of other specified body structures: Secondary | ICD-10-CM | POA: Diagnosis not present

## 2017-11-07 DIAGNOSIS — I081 Rheumatic disorders of both mitral and tricuspid valves: Secondary | ICD-10-CM | POA: Diagnosis not present

## 2017-11-07 DIAGNOSIS — I517 Cardiomegaly: Secondary | ICD-10-CM | POA: Diagnosis not present

## 2017-11-07 DIAGNOSIS — I50814 Right heart failure due to left heart failure: Secondary | ICD-10-CM | POA: Diagnosis not present

## 2017-12-16 DIAGNOSIS — Z452 Encounter for adjustment and management of vascular access device: Secondary | ICD-10-CM | POA: Diagnosis not present

## 2017-12-16 DIAGNOSIS — C3481 Malignant neoplasm of overlapping sites of right bronchus and lung: Secondary | ICD-10-CM | POA: Diagnosis not present

## 2018-01-02 ENCOUNTER — Ambulatory Visit (INDEPENDENT_AMBULATORY_CARE_PROVIDER_SITE_OTHER): Payer: Medicare Other | Admitting: Cardiovascular Disease

## 2018-01-02 ENCOUNTER — Encounter: Payer: Self-pay | Admitting: Cardiovascular Disease

## 2018-01-02 VITALS — BP 106/54 | HR 85 | Ht 68.0 in | Wt 251.6 lb

## 2018-01-02 DIAGNOSIS — I4821 Permanent atrial fibrillation: Secondary | ICD-10-CM

## 2018-01-02 DIAGNOSIS — I482 Chronic atrial fibrillation: Secondary | ICD-10-CM

## 2018-01-02 DIAGNOSIS — I5023 Acute on chronic systolic (congestive) heart failure: Secondary | ICD-10-CM | POA: Diagnosis not present

## 2018-01-02 MED ORDER — TORSEMIDE 20 MG PO TABS: ORAL_TABLET | ORAL | 11 refills | Status: AC

## 2018-01-02 NOTE — Progress Notes (Signed)
Cardiology Office Note Date:  01/02/2018   ID:  Alejandro [name]., DOB 03/22/1946, MRN [id number]  PCP:  Colon Branch, MD  Cardiologist:  [name], MD    Chief Complaint  Patient presents with  . 3 month follow up  . Shortness of Breath     History of Present Illness: [name]. is a 72 y.o. male who presents for follow-up of multiple cardiovascular problems.  The patient has been followed for chronic combined systolic and diastolic heart failure, permanent atrial fibrillation, coronary artery disease, severe mixed cardiomyopathy, and multiple medical problems.  He has had prior subdural hematoma requiring craniotomy and hematoma evacuation.  He has a history of pulmonary embolism and lung cancer.  He has been taken off of warfarin because of heavy alcohol use and fall risk.  He is here with his wife today. He complains of a bad chronic cough. Complains of a lot of mucus, sore throat, worse with movement and lying in certain positions.  No chest pain or chest pressure.  No syncope.  He does complain of leg swelling and orthopnea.  He chronically sleeps in a recliner.  Past Medical History:  Diagnosis Date  . Alcohol abuse   . Atrial fibrillation ([place])   . CAD (coronary artery disease)    s/p CABGx4 on 12/24/2008  . Cancer ([place])    around nose.  New dx of lung cancer  . CHF (congestive heart failure) ([place])   . Diabetes mellitus, type II ([place]) 10/01/2017  . Dilated cardiomyopathy ([place])    Echo 8/18: EF 20, diff HK, mod LAE, PASP 36  . Dyslipidemia   . Dysrhythmia    atrial fib  . GERD (gastroesophageal reflux disease)   . H/O hiatal hernia   . HTN (hypertension)   . Myocardial infarction ([place])   . Non-small cell carcinoma of lung, stage 3 12/16/2014  . S/P chemotherapy, time since 4-12 weeks   . Shortness of breath    occ  . Stroke ([place]) 5/15  . Systolic heart failure ([place])   . Tobacco abuse     Past Surgical History:  Procedure Laterality Date  . APPLICATION  OF A-CELL OF CHEST/ABDOMEN N/A 01/22/2013   Procedure: APPLICATION OF A-CELL OF CHEST/ABDOMEN;  Surgeon: [name], MD;  Location: [facility];  Service: General;  Laterality: N/A;  . CORONARY ARTERY BYPASS GRAFT  12/24/2008   4 vessel  . CORONARY ARTERY BYPASS GRAFT  2012?  . CRANIOTOMY Right 04/19/2014   Procedure: CRANIOTOMY HEMATOMA EVACUATION SUBDURAL;  Surgeon: [name], MD;  Location: [facility];  Service: Neurosurgery;  Laterality: Right;  right  . CRANIOTOMY Right 04/23/2014   Procedure: Craniotomy for Intracerebral Hemorrhage;  Surgeon: [name], MD;  Location: [facility];  Service: Neurosurgery;  Laterality: Right;  . CRANIOTOMY N/A 06/16/2014   Procedure: Craniotomy for intracerebral abscess and subdural empyema;  Surgeon: [name], MD;  Location: [facility];  Service: Neurosurgery;  Laterality: N/A;  . CRANIOTOMY N/A 09/13/2014   Procedure: CRANIOTOMY BONE FLAP/PROSTHETIC PLATE;  Surgeon: [name], MD;  Location: [facility];  Service: Neurosurgery;  Laterality: N/A;  . CYSTOSCOPY N/A 01/22/2013   Procedure: CYSTOSCOPY;  Surgeon: [name], MD;  Location: [facility];  Service: Urology;  Laterality: N/A;  Cystoscopy with balloon dilation. Insertion of coude catheter.  Marland Kitchen HERNIA REPAIR  2012  . UMBILICAL HERNIA REPAIR  2010  . VENTRAL HERNIA REPAIR N/A  01/22/2013   Procedure: HERNIA REPAIR VENTRAL ADULT;  Surgeon: [name], MD;  Location: [facility];  Service: General;  Laterality: N/A;  . VIDEO BRONCHOSCOPY WITH ENDOBRONCHIAL ULTRASOUND N/A 11/09/2014   Procedure: VIDEO BRONCHOSCOPY WITH ENDOBRONCHIAL ULTRASOUND;  Surgeon: Collene Gobble, MD;  [facility];  Service: Thoracic;  Laterality: N/A;    Current Outpatient Medications  Medication Sig Dispense Refill  . aspirin 81 MG tablet Take 81 mg by mouth daily.    . digoxin (LANOXIN) 0.125 MG tablet Take 2 tablets (0.25 mg total) by mouth daily. 180 tablet 3  . furosemide (LASIX) 40 MG tablet Take 40 mg by mouth 2  (two) times daily.    Marland Kitchen lidocaine-prilocaine (EMLA) cream Apply 1 application topically as directed.    . metoprolol succinate (TOPROL-XL) 100 MG 24 hr tablet Take 100 mg by mouth 2 (two) times daily. Take with or immediately following a meal.    . Multiple Vitamin (MULTIVITAMIN WITH MINERALS) TABS tablet Take 1 tablet by mouth daily.    . potassium chloride (K-DUR) 10 MEQ tablet Take 10 mEq by mouth daily.    Marland Kitchen torsemide (DEMADEX) 20 MG tablet Take 40 mg (two tablets) in the morning. Take 20 mg (one tablet) in the evening. 90 tablet 11   No current facility-administered medications for this visit.     Allergies:   Patient has no known allergies.   Social History:  The patient  reports that he quit smoking about 4 years ago. His smoking use included cigarettes. He has a 50.00 pack-year smoking history. he has never used smokeless tobacco. He reports that he drinks about 4.2 oz of alcohol per week. He reports that he does not use drugs.   Family History:  The patient's  family history includes Diabetes in his father and mother; Heart attack in his brother, father, and mother; Heart disease in his brother, father, and mother; Rheum arthritis in his daughter.    ROS:  Please see the history of present illness.  Otherwise, review of systems is positive for fatigue, weakness.  All other systems are reviewed and negative.    PHYSICAL EXAM: VS:  BP (!) 106/54   Pulse 85   Ht 5\' 8"  (1.727 m)   Wt 251 lb 9.6 oz (114.1 kg)   BMI 38.26 kg/m  , BMI Body mass index is 38.26 kg/m. GEN: Well nourished, well developed, obese, chronically ill-appearing male in no acute distress  HEENT: normal  Neck: JVP elevated  No carotid bruits Cardiac: Irregularly irregular without murmur or gallop                Respiratory:  clear to auscultation bilaterally, normal work of breathing GI: soft, nontender, nondistended, + BS MS: no deformity or atrophy  Ext: 1+ bilateral pretibial edema Skin: warm and dry, no  rash Neuro:  Strength and sensation are intact Psych: euthymic mood, full affect  EKG:  EKG is not ordered today.  Recent Labs: 02/18/2017: TSH 2.77 03/11/2017: ALT 26 08/30/2017: NT-Pro BNP 3,179 09/12/2017: BUN 18; Creatinine, Ser 1.26; Potassium 3.5; Sodium 137 10/01/2017: Hemoglobin 14.2; Platelets 184.0   Lipid Panel     Component Value Date/Time   CHOL 105 03/29/2017 1005   TRIG 103 03/29/2017 1005   HDL 29 (L) 03/29/2017 1005   CHOLHDL 3.6 03/29/2017 1005   CHOLHDL 9.0 05/24/2015 0630   VLDL 44 (H) 05/24/2015 0630   LDLCALC 55 03/29/2017 1005      Wt Readings from  Last 3 Encounters:  01/02/18 251 lb 9.6 oz (114.1 kg)  10/04/17 244 lb 12.8 oz (111 kg)  10/01/17 244 lb 6 oz (110.8 kg)     Cardiac Studies Reviewed: 2D echocardiogram 07/26/2017: Study Conclusions  - Left ventricle: The cavity size was mildly dilated. Wall   thickness was normal. The estimated ejection fraction was 20%.   Diffuse hypokinesis. - Left atrium: The atrium was moderately dilated. - Pulmonary arteries: PA peak pressure: 36 mm Hg (S). - Impressions: Incomplete color flow of valves performed.  Impressions:  - Incomplete color flow of valves performed.  ASSESSMENT AND PLAN: 1.  Acute on chronic systolic heart failure: Medications reviewed.  The patient has clinical signs of volume overload.  I recommended increasing torsemide to a schedule 40 mg in the morning and 20 mg in the afternoon.  We discussed fluid and sodium restriction.  We discussed the importance of medication adherence, reduction in alcohol, and lifestyle changes.  Will arrange a follow-up metabolic panel in a few weeks and clinical follow-up thereafter.  2.  Coronary artery disease, native vessel, without angina.  The patient will be continued on his current medications without change.  He is on aspirin for antiplatelet therapy.  3.  Permanent atrial fibrillation: Heart rate is much better controlled.  Continue high dose  metoprolol succinate and digoxin in combination.  The patient is not a candidate for anticoagulation as outlined above.  Current medicines are reviewed with the patient today.  The patient does not have concerns regarding medicines.  Labs/ tests ordered today include:   Orders Placed This Encounter  Procedures  . Basic metabolic panel    Disposition:   [facility] Richardson Dopp 3 months  Signed, [name], MD  01/02/2018 11:51 PM    [address], [place], [place]  [address] Phone: [phone]; Fax: [phone]

## 2018-01-02 NOTE — Patient Instructions (Signed)
Medication Instructions:  1) INCREASE TORSEMIDE to 40 mg in the morning and 20 mg in the evening  Labwork: Your provider recommends that you return for lab work in [place].  Testing/Procedures: None  Follow-Up: Your provider recommends that you schedule a follow-up appointment in 3 MONTHS with Richardson Dopp, PA.  Any Other Special Instructions Will Be Listed Below (If Applicable).     If you need a refill on your cardiac medications before your next appointment, please call your pharmacy.

## 2018-01-17 ENCOUNTER — Other Ambulatory Visit: Payer: Medicare Other | Admitting: *Deleted

## 2018-01-17 DIAGNOSIS — I5023 Acute on chronic systolic (congestive) heart failure: Secondary | ICD-10-CM

## 2018-01-17 DIAGNOSIS — I4821 Permanent atrial fibrillation: Secondary | ICD-10-CM

## 2018-01-17 DIAGNOSIS — I482 Chronic atrial fibrillation: Secondary | ICD-10-CM | POA: Diagnosis not present

## 2018-01-18 LAB — BASIC METABOLIC PANEL
BUN / CREAT RATIO: 13 (ref 10–24)
BUN: 19 mg/dL (ref 8–27)
CALCIUM: 9.3 mg/dL (ref 8.6–10.2)
CHLORIDE: 97 mmol/L (ref 96–106)
CO2: 27 mmol/L (ref 20–29)
Creatinine, Ser: 1.52 mg/dL — ABNORMAL HIGH (ref 0.76–1.27)
GFR calc non Af Amer: 45 mL/min/{1.73_m2} — ABNORMAL LOW (ref >59)
GFR, EST AFRICAN AMERICAN: 52 mL/min/{1.73_m2} — AB (ref >59)
GLUCOSE: 130 mg/dL — AB (ref 65–99)
POTASSIUM: 3.6 mmol/L (ref 3.5–5.2)
Sodium: 140 mmol/L (ref 134–144)

## 2018-01-30 ENCOUNTER — Ambulatory Visit: Payer: Medicare Other | Admitting: Internal Medicine

## 2018-02-06 ENCOUNTER — Ambulatory Visit (INDEPENDENT_AMBULATORY_CARE_PROVIDER_SITE_OTHER): Payer: Medicare Other | Admitting: Internal Medicine

## 2018-02-06 ENCOUNTER — Encounter: Payer: Self-pay | Admitting: Internal Medicine

## 2018-02-06 VITALS — BP 143/70 | HR 85 | Resp 18 | Ht 68.0 in | Wt 259.0 lb

## 2018-02-06 DIAGNOSIS — R627 Adult failure to thrive: Secondary | ICD-10-CM | POA: Diagnosis not present

## 2018-02-06 DIAGNOSIS — I10 Essential (primary) hypertension: Secondary | ICD-10-CM | POA: Diagnosis not present

## 2018-02-06 DIAGNOSIS — I509 Heart failure, unspecified: Secondary | ICD-10-CM | POA: Diagnosis not present

## 2018-02-06 DIAGNOSIS — F329 Major depressive disorder, single episode, unspecified: Secondary | ICD-10-CM | POA: Diagnosis not present

## 2018-02-06 DIAGNOSIS — F1010 Alcohol abuse, uncomplicated: Secondary | ICD-10-CM

## 2018-02-06 DIAGNOSIS — F32A Depression, unspecified: Secondary | ICD-10-CM

## 2018-02-06 DIAGNOSIS — E118 Type 2 diabetes mellitus with unspecified complications: Secondary | ICD-10-CM | POA: Diagnosis not present

## 2018-02-06 DIAGNOSIS — Z862 Personal history of diseases of the blood and blood-forming organs and certain disorders involving the immune mechanism: Secondary | ICD-10-CM | POA: Diagnosis not present

## 2018-02-06 LAB — CBC WITH DIFFERENTIAL/PLATELET
Basophils Absolute: 0 10*3/uL (ref 0.0–0.1)
Basophils Relative: 0.4 % (ref 0.0–3.0)
Eosinophils Absolute: 0.2 10*3/uL (ref 0.0–0.7)
Eosinophils Relative: 2.3 % (ref 0.0–5.0)
HCT: 35.1 % — ABNORMAL LOW (ref 39.0–52.0)
HEMOGLOBIN: 11.4 g/dL — AB (ref 13.0–17.0)
LYMPHS ABS: 1.2 10*3/uL (ref 0.7–4.0)
LYMPHS PCT: 14.6 % (ref 12.0–46.0)
MCHC: 32.5 g/dL (ref 30.0–36.0)
MCV: 85.5 fl (ref 78.0–100.0)
Monocytes Absolute: 0.7 10*3/uL (ref 0.1–1.0)
Monocytes Relative: 8.1 % (ref 3.0–12.0)
NEUTROS ABS: 6.3 10*3/uL (ref 1.4–7.7)
Neutrophils Relative %: 74.6 % (ref 43.0–77.0)
Platelets: 204 10*3/uL (ref 150.0–400.0)
RBC: 4.11 Mil/uL — AB (ref 4.22–5.81)
RDW: 16.7 % — AB (ref 11.5–15.5)
WBC: 8.5 10*3/uL (ref 4.0–10.5)

## 2018-02-06 LAB — HEPATIC FUNCTION PANEL
ALBUMIN: 3.4 g/dL — AB (ref 3.5–5.2)
ALK PHOS: 79 U/L (ref 39–117)
ALT: 15 U/L (ref 0–53)
AST: 21 U/L (ref 0–37)
BILIRUBIN DIRECT: 0.2 mg/dL (ref 0.0–0.3)
TOTAL PROTEIN: 7.8 g/dL (ref 6.0–8.3)
Total Bilirubin: 0.5 mg/dL (ref 0.2–1.2)

## 2018-02-06 LAB — HEMOGLOBIN A1C: Hgb A1c MFr Bld: 8.3 % — ABNORMAL HIGH (ref 4.6–6.5)

## 2018-02-06 LAB — VITAMIN B12: Vitamin B-12: 704 pg/mL (ref 211–911)

## 2018-02-06 LAB — FOLATE: Folate: >23.6 ng/mL (ref >5.9)

## 2018-02-06 LAB — TSH: TSH: 2.28 u[IU]/mL (ref 0.35–4.50)

## 2018-02-06 NOTE — Progress Notes (Signed)
Pre visit review using our clinic review tool, if applicable. No additional management support is needed unless otherwise documented below in the visit note. 

## 2018-02-06 NOTE — Assessment & Plan Note (Signed)
DM: Diet controlled, checking labs HTN: BP slightly elevated today, last BMP satisfactory, currently on metoprolol, Demadex, potassium.  Recheck a CBC.  See next CHF: + Edema today, DOE increased, currently on Demadex 20 mg: 2 in the morning, 1 PM. Recommend Demadex 3 in the morning and 2 in the afternoon times 4 days, leg elevation, also take 2 KCLs x 4 days and RTC 10 days. Check a TSH Atrial fibrillation: On metoprolol, self DC digoxin, rec to restart. EtOH: Remains a problem, counseled, check LFTs, X58, folic acid.  Depression: counselor? declined. Offered SSRIs, declined . Denies suicidal ideas Failure to thrive: As described above, likely multifactorial including sedentarism, medical problems, EtOH, depression, etc. We will try to optimize CHF, also check TSH. RTC 10 days

## 2018-02-06 NOTE — Progress Notes (Signed)
Subjective:    Patient ID: Alejandro Mates., male    DOB: [birth year], 72 y.o.   MRN: [id number]  DOS:  02/06/2018 Type of visit - description : f/u Interval history: Patient is here with his wife, she reports overall a gradual deterioration of his status for the last several weeks.  She think is both physical and emotional  deterioration. If he is not able (or willing) to do anything for himself, if he is in bed he will simply use a urinal instead of going to the bathroom. I asked the patient about depression and he admits to being sad or down often.  Some anxiety also. He filled a PHQ 9 form, results were negative but patient's wife said that answers are not correct and doesn't reflect what she observe .  EtOH: Continue drinking, patient said 3 beers a day, wife says more like 4-5 every night.  Lung cancer: Saw the oncologist  at [facility], had a CT of the chest, was told findings stable but there was a question of clot in the heart, subsequently echocardiogram was done, he was told no clot, EF was 25%.     CT 11-07-2017 1.No change or slight improvement in partial consolidation of the left upper lobe. 2. Decreased right effusion. 3. Mediastinal adenopathy, probably reactive. 4. New small area of consolidation in the lingula.  5. 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    Wt Readings from Last 3 Encounters:  02/06/18 259 lb (117.5 kg)  01/02/18 251 lb 9.6 oz (114.1 kg)  10/04/17 244 lb 12.8 oz (111 kg)    Review of Systems Occasional fevers?  No chills. No chest pain Lower extremity edema slightly increased. + DOE, worse lately. Admits to some nausea, states "that is why it drink, beer makes the nausea better".  No vomiting, diarrhea or blood in the stools. No suicidal ideas No headaches  Past Medical History:  Diagnosis Date  . Alcohol abuse   . Atrial fibrillation ([place])   . CAD (coronary artery disease)    s/p  CABGx4 on 12/24/2008  . Cancer ([place])    around nose.  New dx of lung cancer  . CHF (congestive heart failure) ([place])   . Diabetes mellitus, type II ([place]) 10/01/2017  . Dilated cardiomyopathy ([place])    Echo 8/18: EF 20, diff HK, mod LAE, PASP 36  . Dyslipidemia   . Dysrhythmia    atrial fib  . GERD (gastroesophageal reflux disease)   . H/O hiatal hernia   . HTN (hypertension)   . Myocardial infarction ([place])   . Non-small cell carcinoma of lung, stage 3 12/16/2014  . S/P chemotherapy, time since 4-12 weeks   . Shortness of breath    occ  . Stroke ([place]) 5/15  . Systolic heart failure ([place])   . Tobacco abuse     Past Surgical History:  Procedure Laterality Date  . APPLICATION OF A-CELL OF CHEST/ABDOMEN N/A 01/22/2013   Procedure: APPLICATION OF A-CELL OF CHEST/ABDOMEN;  Surgeon: [name], MD;  Location: [facility];  Service: General;  Laterality: N/A;  . CORONARY ARTERY BYPASS GRAFT  12/24/2008   4 vessel  . CORONARY ARTERY BYPASS GRAFT  2012?  . CRANIOTOMY Right 04/19/2014   Procedure: CRANIOTOMY HEMATOMA EVACUATION SUBDURAL;  Surgeon: [name], MD;  Location: [facility];  Service: Neurosurgery;  Laterality: Right;  right  . CRANIOTOMY Right 04/23/2014   Procedure: Craniotomy for  Intracerebral Hemorrhage;  Surgeon: [name], MD;  Location: [facility];  Service: Neurosurgery;  Laterality: Right;  . 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 CRANIOTOMY N/A 09/13/2014   Procedure: CRANIOTOMY BONE FLAP/PROSTHETIC PLATE;  Surgeon: [name], MD;  Location: [facility];  Service: Neurosurgery;  Laterality: N/A;  . CYSTOSCOPY N/A 01/22/2013   Procedure: CYSTOSCOPY;  Surgeon: [name], MD;  Location: [facility];  Service: Urology;  Laterality: N/A;  Cystoscopy with balloon dilation. Insertion of coude catheter.  Marland Kitchen HERNIA REPAIR   2012  . UMBILICAL HERNIA REPAIR  2010  . VENTRAL HERNIA REPAIR N/A 01/22/2013   Procedure: HERNIA REPAIR VENTRAL ADULT;  Surgeon: [name], MD;  Location: [facility];  Service: General;  Laterality: N/A;  . VIDEO BRONCHOSCOPY WITH ENDOBRONCHIAL ULTRASOUND N/A 11/09/2014   Procedure: VIDEO BRONCHOSCOPY WITH ENDOBRONCHIAL ULTRASOUND;  Surgeon: Collene Gobble, MD;  Location: [facility];  Service: Thoracic;  Laterality: N/A;    Social History   Socioeconomic History  . Marital status: Married    Spouse name: Not on file  . Number of children: 2  . Years of education: Not on file  . Highest education level: Not on file  Social Needs  . Financial resource strain: Not on file  . Food insecurity - worry: Not on file  . Food insecurity - inability: Not on file  . Transportation needs - medical: Not on file  . Transportation needs - non-medical: Not on file  Occupational History  . Occupation: [employer] - retired  Tobacco Use  . Smoking status: Former Smoker    Packs/day: 1.00    Years: 50.00    Pack years: 50.00    Types: Cigarettes    Last attempt to quit: 04/19/2013    Years since quitting: 4.8  . Smokeless tobacco: Never Used  Substance and Sexual Activity  . Alcohol use: Yes    Alcohol/week: 4.2 oz    Types: 7 Cans of beer per week  . Drug use: No  . Sexual activity: Not on file  Other Topics Concern  . Not on file  Social History Narrative   Lives in [place], [place] with wife.  Ambulates with a cane      Allergies as of 02/06/2018   No Known Allergies     Medication List        Accurate as of 02/06/18  5:37 PM. Always use your most recent med list.          aspirin 81 MG tablet Take 81 mg by mouth daily.   digoxin 0.125 MG tablet Commonly known as:  LANOXIN Take 2 tablets (0.25 mg total) by mouth daily.   lidocaine-prilocaine cream Commonly known as:  EMLA Apply 1 application topically as directed.   metoprolol succinate 100 MG 24 hr  tablet Commonly known as:  TOPROL-XL Take 100 mg by mouth 2 (two) times daily. Take with or immediately following a meal.   multivitamin with minerals Tabs tablet Take 1 tablet by mouth daily.   potassium chloride 10 MEQ tablet Commonly known as:  K-DUR Take 10 mEq by mouth daily.   torsemide 20 MG tablet Commonly known as:  DEMADEX Take 40 mg (two tablets) in the morning. Take 20 mg (one tablet) in the evening.  Objective:   Physical Exam BP (!) 143/70 (BP Location: Left Arm, Patient Position: Sitting, Cuff Size: Large)   Pulse 85   Resp 18   Ht 5\' 8"  (1.727 m)   Wt 259 lb (117.5 kg)   SpO2 93%   BMI 39.38 kg/m  General:   Well developed, well nourished . NAD.  HEENT:  Normocephalic . Face symmetric, atraumatic Lungs:  CTA B Normal respiratory effort, no intercostal retractions, no accessory muscle use. Heart: Irregular but not tachycardic.  no murmur.  + to ++/ +++ pretibial edema bilaterally  Abdomen: Soft, nontender.  Exam is normal blood the patient is sitting on a wheel chair Skin: Not pale. Not jaundice Neurologic:  alert & oriented X3.  Speech normal, gait not tested Psych--  Cognition and judgment appear intact.  Cooperative with normal attention span and concentration.  Behavior appropriate. No anxious or depressed appearing.      Assessment & Plan:    Assessment  DM HTN Dyslipidemia Cardiovascular:  Dr Quentin Cornwall --CAD, history of MI, CABG 2010 --Atrial fibrillation -- no coumadin candidate since 04-2014 --CHF H/o SDH,   F/u by intraparenchymal bleed and infection >>>>craniotomy 04/19/2014, 04/23/2014, 06/16/2014, 09/13/2014 On KEPPRA  Non-small cell lung cancer  Dx 12-2014, transfer care to [facility] ~ 06-2015, also seen at pulmonary- [facility] 10-2016 PE: per CT ([facility]) 2-20-017, seen again in CT 02-16-2016 GERD H/O  tobacco,  + EtOH Skin cancer, nose  PLAN: DM: Diet controlled, checking labs HTN: BP slightly elevated today, last BMP  satisfactory, currently on metoprolol, Demadex, potassium.  Recheck a CBC.  See next CHF: + Edema today, DOE increased, currently on Demadex 20 mg: 2 in the morning, 1 PM. Recommend Demadex 3 in the morning and 2 in the afternoon times 4 days, leg elevation, also take 2 KCLs x 4 days and RTC 10 days. Check a TSH Atrial fibrillation: On metoprolol, self DC digoxin, rec to restart. EtOH: Remains a problem, counseled, check LFTs, Y19, folic acid.  Depression: counselor? declined. Offered SSRIs, declined . Denies suicidal ideas Failure to thrive: As described above, likely multifactorial including sedentarism, medical problems, EtOH, depression, etc. We will try to optimize CHF, also check TSH. RTC 10 days  Today, I spent more than  45  min with the patient: >50% of the time counseling regards FTT, benefits of counseling, antidepresants; listening to the concerns of the wife and clarifying the history as wife version is different to pt's

## 2018-02-06 NOTE — Patient Instructions (Signed)
GO TO THE LAB : Get the blood work     GO TO THE FRONT DESK Schedule your next appointment for a checkup in 10 days  Daily weights  Go back on digoxin  Demadex: For the next 4 days only: 3 tablets in the morning 2 tablets in the afternoon  After that, go back to your normal doses

## 2018-02-11 MED ORDER — LINAGLIPTIN 5 MG PO TABS: 5.0000 mg | ORAL_TABLET | Freq: Every day | ORAL | 3 refills | Status: AC

## 2018-02-11 NOTE — Addendum Note (Signed)
Addended byDamita Dunnings D on: 02/11/2018 01:37 PM   Modules accepted: Orders

## 2018-02-17 ENCOUNTER — Ambulatory Visit: Payer: Medicare Other | Admitting: Internal Medicine

## 2018-03-04 ENCOUNTER — Ambulatory Visit: Payer: Medicare Other | Admitting: Physician Assistant

## 2018-03-06 DIAGNOSIS — E876 Hypokalemia: Secondary | ICD-10-CM | POA: Diagnosis not present

## 2018-03-06 DIAGNOSIS — I5023 Acute on chronic systolic (congestive) heart failure: Secondary | ICD-10-CM | POA: Diagnosis not present

## 2018-03-06 DIAGNOSIS — D539 Nutritional anemia, unspecified: Secondary | ICD-10-CM | POA: Diagnosis not present

## 2018-03-06 DIAGNOSIS — R0602 Shortness of breath: Secondary | ICD-10-CM | POA: Diagnosis not present

## 2018-03-06 DIAGNOSIS — E861 Hypovolemia: Secondary | ICD-10-CM | POA: Diagnosis not present

## 2018-03-06 DIAGNOSIS — R590 Localized enlarged lymph nodes: Secondary | ICD-10-CM | POA: Diagnosis not present

## 2018-03-06 DIAGNOSIS — J9611 Chronic respiratory failure with hypoxia: Secondary | ICD-10-CM | POA: Diagnosis not present

## 2018-03-06 DIAGNOSIS — N183 Chronic kidney disease, stage 3 (moderate): Secondary | ICD-10-CM | POA: Diagnosis not present

## 2018-03-06 DIAGNOSIS — J90 Pleural effusion, not elsewhere classified: Secondary | ICD-10-CM | POA: Diagnosis not present

## 2018-03-06 DIAGNOSIS — C3481 Malignant neoplasm of overlapping sites of right bronchus and lung: Secondary | ICD-10-CM | POA: Diagnosis not present

## 2018-03-06 DIAGNOSIS — R918 Other nonspecific abnormal finding of lung field: Secondary | ICD-10-CM | POA: Diagnosis not present

## 2018-03-07 DIAGNOSIS — J90 Pleural effusion, not elsewhere classified: Secondary | ICD-10-CM | POA: Diagnosis not present

## 2018-03-07 DIAGNOSIS — Z955 Presence of coronary angioplasty implant and graft: Secondary | ICD-10-CM | POA: Diagnosis not present

## 2018-03-07 DIAGNOSIS — D649 Anemia, unspecified: Secondary | ICD-10-CM | POA: Diagnosis not present

## 2018-03-07 DIAGNOSIS — I517 Cardiomegaly: Secondary | ICD-10-CM | POA: Diagnosis not present

## 2018-03-07 DIAGNOSIS — I5023 Acute on chronic systolic (congestive) heart failure: Secondary | ICD-10-CM | POA: Diagnosis not present

## 2018-03-07 DIAGNOSIS — I5189 Other ill-defined heart diseases: Secondary | ICD-10-CM | POA: Diagnosis not present

## 2018-03-07 DIAGNOSIS — Z7951 Long term (current) use of inhaled steroids: Secondary | ICD-10-CM | POA: Diagnosis not present

## 2018-03-07 DIAGNOSIS — Z951 Presence of aortocoronary bypass graft: Secondary | ICD-10-CM | POA: Diagnosis not present

## 2018-03-07 DIAGNOSIS — I081 Rheumatic disorders of both mitral and tricuspid valves: Secondary | ICD-10-CM | POA: Diagnosis not present

## 2018-03-07 DIAGNOSIS — I2510 Atherosclerotic heart disease of native coronary artery without angina pectoris: Secondary | ICD-10-CM | POA: Diagnosis not present

## 2018-03-07 DIAGNOSIS — D539 Nutritional anemia, unspecified: Secondary | ICD-10-CM | POA: Diagnosis present

## 2018-03-07 DIAGNOSIS — Z7982 Long term (current) use of aspirin: Secondary | ICD-10-CM | POA: Diagnosis not present

## 2018-03-07 DIAGNOSIS — R931 Abnormal findings on diagnostic imaging of heart and coronary circulation: Secondary | ICD-10-CM | POA: Diagnosis present

## 2018-03-07 DIAGNOSIS — Z86718 Personal history of other venous thrombosis and embolism: Secondary | ICD-10-CM | POA: Diagnosis not present

## 2018-03-07 DIAGNOSIS — N189 Chronic kidney disease, unspecified: Secondary | ICD-10-CM | POA: Diagnosis not present

## 2018-03-07 DIAGNOSIS — Z86711 Personal history of pulmonary embolism: Secondary | ICD-10-CM | POA: Diagnosis not present

## 2018-03-07 DIAGNOSIS — R918 Other nonspecific abnormal finding of lung field: Secondary | ICD-10-CM | POA: Diagnosis not present

## 2018-03-07 DIAGNOSIS — Z9981 Dependence on supplemental oxygen: Secondary | ICD-10-CM | POA: Diagnosis not present

## 2018-03-07 DIAGNOSIS — K219 Gastro-esophageal reflux disease without esophagitis: Secondary | ICD-10-CM | POA: Diagnosis present

## 2018-03-07 DIAGNOSIS — I255 Ischemic cardiomyopathy: Secondary | ICD-10-CM | POA: Diagnosis not present

## 2018-03-07 DIAGNOSIS — E876 Hypokalemia: Secondary | ICD-10-CM | POA: Diagnosis present

## 2018-03-07 DIAGNOSIS — Z79899 Other long term (current) drug therapy: Secondary | ICD-10-CM | POA: Diagnosis not present

## 2018-03-07 DIAGNOSIS — R590 Localized enlarged lymph nodes: Secondary | ICD-10-CM | POA: Diagnosis not present

## 2018-03-07 DIAGNOSIS — I429 Cardiomyopathy, unspecified: Secondary | ICD-10-CM | POA: Diagnosis not present

## 2018-03-07 DIAGNOSIS — Z87891 Personal history of nicotine dependence: Secondary | ICD-10-CM | POA: Diagnosis not present

## 2018-03-07 DIAGNOSIS — E1122 Type 2 diabetes mellitus with diabetic chronic kidney disease: Secondary | ICD-10-CM | POA: Diagnosis present

## 2018-03-07 DIAGNOSIS — R600 Localized edema: Secondary | ICD-10-CM | POA: Diagnosis present

## 2018-03-07 DIAGNOSIS — R0602 Shortness of breath: Secondary | ICD-10-CM | POA: Diagnosis not present

## 2018-03-07 DIAGNOSIS — R7989 Other specified abnormal findings of blood chemistry: Secondary | ICD-10-CM | POA: Diagnosis present

## 2018-03-07 DIAGNOSIS — I482 Chronic atrial fibrillation: Secondary | ICD-10-CM | POA: Diagnosis not present

## 2018-03-07 DIAGNOSIS — I878 Other specified disorders of veins: Secondary | ICD-10-CM | POA: Diagnosis not present

## 2018-03-07 DIAGNOSIS — C3481 Malignant neoplasm of overlapping sites of right bronchus and lung: Secondary | ICD-10-CM | POA: Diagnosis not present

## 2018-03-07 DIAGNOSIS — N183 Chronic kidney disease, stage 3 (moderate): Secondary | ICD-10-CM | POA: Diagnosis not present

## 2018-03-07 DIAGNOSIS — J449 Chronic obstructive pulmonary disease, unspecified: Secondary | ICD-10-CM | POA: Diagnosis not present

## 2018-03-07 DIAGNOSIS — J9611 Chronic respiratory failure with hypoxia: Secondary | ICD-10-CM | POA: Diagnosis present

## 2018-03-07 DIAGNOSIS — R748 Abnormal levels of other serum enzymes: Secondary | ICD-10-CM | POA: Diagnosis not present

## 2018-03-07 DIAGNOSIS — I519 Heart disease, unspecified: Secondary | ICD-10-CM | POA: Diagnosis not present

## 2018-03-13 MED ORDER — ACETAMINOPHEN 325 MG PO TABS
650.00 | ORAL_TABLET | ORAL | Status: DC
Start: ? — End: 2018-03-13

## 2018-03-13 MED ORDER — ALBUTEROL SULFATE (2.5 MG/3ML) 0.083% IN NEBU
2.50 | INHALATION_SOLUTION | RESPIRATORY_TRACT | Status: DC
Start: ? — End: 2018-03-13

## 2018-03-13 MED ORDER — POTASSIUM CHLORIDE CRYS ER 20 MEQ PO TBCR
EXTENDED_RELEASE_TABLET | ORAL | Status: DC
Start: 2018-03-12 — End: 2018-03-13

## 2018-03-13 MED ORDER — APIXABAN 5 MG PO TABS
5.00 | ORAL_TABLET | ORAL | Status: DC
Start: 2018-03-12 — End: 2018-03-13

## 2018-03-13 MED ORDER — LACTATED RINGERS IV SOLN
INTRAVENOUS | Status: DC
Start: ? — End: 2018-03-13

## 2018-03-13 MED ORDER — ACETAMINOPHEN 650 MG RE SUPP
650.00 | RECTAL | Status: DC
Start: ? — End: 2018-03-13

## 2018-03-13 MED ORDER — DEXTROSE 50 % IV SOLN
25.00 | INTRAVENOUS | Status: DC
Start: ? — End: 2018-03-13

## 2018-03-13 MED ORDER — THERA PO TABS
ORAL_TABLET | ORAL | Status: DC
Start: 2018-03-13 — End: 2018-03-13

## 2018-03-13 MED ORDER — HYDRALAZINE HCL 20 MG/ML IJ SOLN
10.00 | INTRAMUSCULAR | Status: DC
Start: ? — End: 2018-03-13

## 2018-03-13 MED ORDER — GENERIC EXTERNAL MEDICATION
Status: DC
Start: ? — End: 2018-03-13

## 2018-03-13 MED ORDER — TORSEMIDE 20 MG PO TABS
50.00 | ORAL_TABLET | ORAL | Status: DC
Start: 2018-03-13 — End: 2018-03-13

## 2018-03-13 MED ORDER — PNEUMOCOCCAL 13-VAL CONJ VACC IM SUSP
.50 | INTRAMUSCULAR | Status: DC
Start: ? — End: 2018-03-13

## 2018-03-13 MED ORDER — INSULIN REGULAR HUMAN 100 UNIT/ML IJ SOLN
INTRAMUSCULAR | Status: DC
Start: ? — End: 2018-03-13

## 2018-03-13 MED ORDER — ASPIRIN 81 MG PO CHEW
81.00 | CHEWABLE_TABLET | ORAL | Status: DC
Start: 2018-03-13 — End: 2018-03-13

## 2018-03-13 MED ORDER — SODIUM CHLORIDE 0.9 % IV SOLN
INTRAVENOUS | Status: DC
Start: ? — End: 2018-03-13

## 2018-03-13 MED ORDER — METOPROLOL SUCCINATE ER 100 MG PO TB24
100.00 | ORAL_TABLET | ORAL | Status: DC
Start: 2018-03-12 — End: 2018-03-13

## 2018-03-14 MED ORDER — GENERIC EXTERNAL MEDICATION
Status: DC
Start: ? — End: 2018-03-14

## 2018-03-17 DIAGNOSIS — I255 Ischemic cardiomyopathy: Secondary | ICD-10-CM | POA: Diagnosis not present

## 2018-03-17 DIAGNOSIS — Z923 Personal history of irradiation: Secondary | ICD-10-CM | POA: Diagnosis not present

## 2018-03-17 DIAGNOSIS — Z808 Family history of malignant neoplasm of other organs or systems: Secondary | ICD-10-CM | POA: Diagnosis not present

## 2018-03-17 DIAGNOSIS — I519 Heart disease, unspecified: Secondary | ICD-10-CM | POA: Diagnosis not present

## 2018-03-17 DIAGNOSIS — C3481 Malignant neoplasm of overlapping sites of right bronchus and lung: Secondary | ICD-10-CM | POA: Diagnosis not present

## 2018-03-17 DIAGNOSIS — I10 Essential (primary) hypertension: Secondary | ICD-10-CM | POA: Diagnosis not present

## 2018-03-17 DIAGNOSIS — Z9221 Personal history of antineoplastic chemotherapy: Secondary | ICD-10-CM | POA: Diagnosis not present

## 2018-03-17 DIAGNOSIS — Z85118 Personal history of other malignant neoplasm of bronchus and lung: Secondary | ICD-10-CM | POA: Diagnosis not present

## 2018-03-17 DIAGNOSIS — Z9981 Dependence on supplemental oxygen: Secondary | ICD-10-CM | POA: Diagnosis not present

## 2018-03-17 DIAGNOSIS — Z87891 Personal history of nicotine dependence: Secondary | ICD-10-CM | POA: Diagnosis not present

## 2018-03-17 DIAGNOSIS — I5020 Unspecified systolic (congestive) heart failure: Secondary | ICD-10-CM | POA: Diagnosis not present

## 2018-03-19 DIAGNOSIS — Z09 Encounter for follow-up examination after completed treatment for conditions other than malignant neoplasm: Secondary | ICD-10-CM | POA: Diagnosis not present

## 2018-03-19 DIAGNOSIS — I519 Heart disease, unspecified: Secondary | ICD-10-CM | POA: Diagnosis not present

## 2018-03-19 DIAGNOSIS — I10 Essential (primary) hypertension: Secondary | ICD-10-CM | POA: Diagnosis not present

## 2018-03-25 DIAGNOSIS — C3490 Malignant neoplasm of unspecified part of unspecified bronchus or lung: Secondary | ICD-10-CM | POA: Diagnosis not present

## 2018-03-25 DIAGNOSIS — J90 Pleural effusion, not elsewhere classified: Secondary | ICD-10-CM | POA: Diagnosis not present

## 2018-03-25 DIAGNOSIS — C3481 Malignant neoplasm of overlapping sites of right bronchus and lung: Secondary | ICD-10-CM | POA: Diagnosis not present

## 2018-03-25 DIAGNOSIS — R918 Other nonspecific abnormal finding of lung field: Secondary | ICD-10-CM | POA: Diagnosis not present

## 2018-03-25 DIAGNOSIS — J984 Other disorders of lung: Secondary | ICD-10-CM | POA: Diagnosis not present

## 2018-04-01 ENCOUNTER — Telehealth: Payer: Self-pay | Admitting: Cardiovascular Disease

## 2018-04-01 DIAGNOSIS — I5020 Unspecified systolic (congestive) heart failure: Secondary | ICD-10-CM | POA: Diagnosis not present

## 2018-04-01 DIAGNOSIS — C3481 Malignant neoplasm of overlapping sites of right bronchus and lung: Secondary | ICD-10-CM | POA: Diagnosis not present

## 2018-04-01 DIAGNOSIS — Z923 Personal history of irradiation: Secondary | ICD-10-CM | POA: Diagnosis not present

## 2018-04-01 DIAGNOSIS — I255 Ischemic cardiomyopathy: Secondary | ICD-10-CM | POA: Diagnosis not present

## 2018-04-01 DIAGNOSIS — I10 Essential (primary) hypertension: Secondary | ICD-10-CM | POA: Diagnosis not present

## 2018-04-01 DIAGNOSIS — Z808 Family history of malignant neoplasm of other organs or systems: Secondary | ICD-10-CM | POA: Diagnosis not present

## 2018-04-01 DIAGNOSIS — Z9981 Dependence on supplemental oxygen: Secondary | ICD-10-CM | POA: Diagnosis not present

## 2018-04-01 DIAGNOSIS — I519 Heart disease, unspecified: Secondary | ICD-10-CM | POA: Diagnosis not present

## 2018-04-01 DIAGNOSIS — Z87891 Personal history of nicotine dependence: Secondary | ICD-10-CM | POA: Diagnosis not present

## 2018-04-01 DIAGNOSIS — Z9221 Personal history of antineoplastic chemotherapy: Secondary | ICD-10-CM | POA: Diagnosis not present

## 2018-04-01 DIAGNOSIS — Z85118 Personal history of other malignant neoplasm of bronchus and lung: Secondary | ICD-10-CM | POA: Diagnosis not present

## 2018-04-01 NOTE — Telephone Encounter (Signed)
Attempted return call - mailbox is full. Unable to leave message.  Will try again later.

## 2018-04-01 NOTE — Telephone Encounter (Signed)
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

## 2018-04-02 ENCOUNTER — Ambulatory Visit: Payer: Medicare Other | Admitting: Physician Assistant

## 2018-04-07 NOTE — Telephone Encounter (Signed)
Patient's wife states they do not want to make an appointment at this time, they just wanted to let Dr. [name] know the patient has a tumor in his heart.  Gave her well wishes and instructed her to call the office if they need anything.    Per 4/15 OV note: "Right atrium tumor: I had a long discussion with Alejandro [name] and his family. He came to the clinic with his wife and adult daughter. We discussed the fact that on the echocardiogram; the atrial lesion had an appearance suggestive of a tumor and not a clot. 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 The CT of the chest mediastinal adenopathy but this has been a chronic finding for him. We discussed the fact that we will need to get a PET scan to determine the extent of this possible metastatic disease. The PET scan will help to identify a lesion that would be amenable to a less risky biopsy. Due to his co morbidities he does not seem to be the best candidate for EBUS bronchoscopy."   To Dr. [name] as Alejandro [name].

## 2018-04-08 DIAGNOSIS — I519 Heart disease, unspecified: Secondary | ICD-10-CM | POA: Diagnosis not present

## 2018-04-08 DIAGNOSIS — I10 Essential (primary) hypertension: Secondary | ICD-10-CM | POA: Diagnosis not present

## 2018-05-15 ENCOUNTER — Telehealth: Payer: Self-pay | Admitting: *Deleted

## 2018-05-15 DIAGNOSIS — E1122 Type 2 diabetes mellitus with diabetic chronic kidney disease: Secondary | ICD-10-CM | POA: Diagnosis present

## 2018-05-15 DIAGNOSIS — K7460 Unspecified cirrhosis of liver: Secondary | ICD-10-CM | POA: Diagnosis not present

## 2018-05-15 DIAGNOSIS — I2510 Atherosclerotic heart disease of native coronary artery without angina pectoris: Secondary | ICD-10-CM | POA: Diagnosis not present

## 2018-05-15 DIAGNOSIS — I519 Heart disease, unspecified: Secondary | ICD-10-CM | POA: Diagnosis not present

## 2018-05-15 DIAGNOSIS — Z951 Presence of aortocoronary bypass graft: Secondary | ICD-10-CM | POA: Diagnosis not present

## 2018-05-15 DIAGNOSIS — Z955 Presence of coronary angioplasty implant and graft: Secondary | ICD-10-CM | POA: Diagnosis not present

## 2018-05-15 DIAGNOSIS — Z66 Do not resuscitate: Secondary | ICD-10-CM | POA: Diagnosis not present

## 2018-05-15 DIAGNOSIS — Z7189 Other specified counseling: Secondary | ICD-10-CM | POA: Diagnosis not present

## 2018-05-15 DIAGNOSIS — I482 Chronic atrial fibrillation: Secondary | ICD-10-CM | POA: Diagnosis not present

## 2018-05-15 DIAGNOSIS — K7689 Other specified diseases of liver: Secondary | ICD-10-CM | POA: Diagnosis not present

## 2018-05-15 DIAGNOSIS — I130 Hypertensive heart and chronic kidney disease with heart failure and stage 1 through stage 4 chronic kidney disease, or unspecified chronic kidney disease: Secondary | ICD-10-CM | POA: Diagnosis present

## 2018-05-15 DIAGNOSIS — F329 Major depressive disorder, single episode, unspecified: Secondary | ICD-10-CM | POA: Diagnosis not present

## 2018-05-15 DIAGNOSIS — C3490 Malignant neoplasm of unspecified part of unspecified bronchus or lung: Secondary | ICD-10-CM | POA: Diagnosis not present

## 2018-05-15 DIAGNOSIS — Z9981 Dependence on supplemental oxygen: Secondary | ICD-10-CM | POA: Diagnosis not present

## 2018-05-15 DIAGNOSIS — J9611 Chronic respiratory failure with hypoxia: Secondary | ICD-10-CM | POA: Diagnosis not present

## 2018-05-15 DIAGNOSIS — K921 Melena: Secondary | ICD-10-CM | POA: Diagnosis not present

## 2018-05-15 DIAGNOSIS — I517 Cardiomegaly: Secondary | ICD-10-CM | POA: Diagnosis not present

## 2018-05-15 DIAGNOSIS — R0602 Shortness of breath: Secondary | ICD-10-CM | POA: Diagnosis not present

## 2018-05-15 DIAGNOSIS — R918 Other nonspecific abnormal finding of lung field: Secondary | ICD-10-CM | POA: Diagnosis not present

## 2018-05-15 DIAGNOSIS — K922 Gastrointestinal hemorrhage, unspecified: Secondary | ICD-10-CM | POA: Diagnosis not present

## 2018-05-15 DIAGNOSIS — D62 Acute posthemorrhagic anemia: Secondary | ICD-10-CM | POA: Diagnosis not present

## 2018-05-15 DIAGNOSIS — I864 Gastric varices: Secondary | ICD-10-CM | POA: Diagnosis not present

## 2018-05-15 DIAGNOSIS — K259 Gastric ulcer, unspecified as acute or chronic, without hemorrhage or perforation: Secondary | ICD-10-CM | POA: Diagnosis not present

## 2018-05-15 DIAGNOSIS — Z9114 Patient's other noncompliance with medication regimen: Secondary | ICD-10-CM | POA: Diagnosis not present

## 2018-05-15 DIAGNOSIS — Z86718 Personal history of other venous thrombosis and embolism: Secondary | ICD-10-CM | POA: Diagnosis not present

## 2018-05-15 DIAGNOSIS — K254 Chronic or unspecified gastric ulcer with hemorrhage: Secondary | ICD-10-CM | POA: Diagnosis present

## 2018-05-15 DIAGNOSIS — I255 Ischemic cardiomyopathy: Secondary | ICD-10-CM | POA: Diagnosis not present

## 2018-05-15 DIAGNOSIS — Z515 Encounter for palliative care: Secondary | ICD-10-CM | POA: Diagnosis not present

## 2018-05-15 DIAGNOSIS — C3481 Malignant neoplasm of overlapping sites of right bronchus and lung: Secondary | ICD-10-CM | POA: Diagnosis not present

## 2018-05-15 DIAGNOSIS — I5022 Chronic systolic (congestive) heart failure: Secondary | ICD-10-CM | POA: Diagnosis not present

## 2018-05-15 DIAGNOSIS — J449 Chronic obstructive pulmonary disease, unspecified: Secondary | ICD-10-CM | POA: Diagnosis not present

## 2018-05-15 DIAGNOSIS — I10 Essential (primary) hypertension: Secondary | ICD-10-CM | POA: Diagnosis not present

## 2018-05-15 DIAGNOSIS — E669 Obesity, unspecified: Secondary | ICD-10-CM | POA: Diagnosis present

## 2018-05-15 DIAGNOSIS — Z87891 Personal history of nicotine dependence: Secondary | ICD-10-CM | POA: Diagnosis not present

## 2018-05-15 DIAGNOSIS — Z86711 Personal history of pulmonary embolism: Secondary | ICD-10-CM | POA: Diagnosis not present

## 2018-05-15 DIAGNOSIS — F419 Anxiety disorder, unspecified: Secondary | ICD-10-CM | POA: Diagnosis not present

## 2018-05-15 DIAGNOSIS — Z6838 Body mass index (BMI) 38.0-38.9, adult: Secondary | ICD-10-CM | POA: Diagnosis not present

## 2018-05-15 DIAGNOSIS — N183 Chronic kidney disease, stage 3 (moderate): Secondary | ICD-10-CM | POA: Diagnosis not present

## 2018-05-15 NOTE — Telephone Encounter (Signed)
Received Physician Orders/CMS Oxygen from [facility]; forwarded to provider/SLS 06/13

## 2018-05-16 NOTE — Telephone Encounter (Signed)
Forms completed and faxed to [facility] at [phone]. Form sent for scanning.

## 2018-05-22 MED ORDER — GENERIC EXTERNAL MEDICATION
Status: DC
Start: ? — End: 2018-05-22

## 2018-05-22 MED ORDER — TORSEMIDE 100 MG PO TABS
100.00 | ORAL_TABLET | ORAL | Status: DC
Start: 2018-05-21 — End: 2018-05-22

## 2018-05-22 MED ORDER — SODIUM CHLORIDE 0.9 % IV SOLN
10.00 | INTRAVENOUS | Status: DC
Start: ? — End: 2018-05-22

## 2018-05-22 MED ORDER — NITROGLYCERIN 0.4 MG SL SUBL
0.40 | SUBLINGUAL_TABLET | SUBLINGUAL | Status: DC
Start: ? — End: 2018-05-22

## 2018-05-22 MED ORDER — THERA PO TABS
1.00 | ORAL_TABLET | ORAL | Status: DC
Start: 2018-05-21 — End: 2018-05-22

## 2018-05-22 MED ORDER — ACETAMINOPHEN 325 MG PO TABS
650.00 | ORAL_TABLET | ORAL | Status: DC
Start: ? — End: 2018-05-22

## 2018-05-22 MED ORDER — PANTOPRAZOLE SODIUM 40 MG IV SOLR
40.00 | INTRAVENOUS | Status: DC
Start: 2018-05-20 — End: 2018-05-22

## 2018-05-22 MED ORDER — LACTATED RINGERS IV SOLN
25.00 | INTRAVENOUS | Status: DC
Start: ? — End: 2018-05-22

## 2018-05-22 MED ORDER — METOPROLOL SUCCINATE ER 100 MG PO TB24
100.00 | ORAL_TABLET | ORAL | Status: DC
Start: 2018-05-21 — End: 2018-05-22

## 2018-05-22 MED ORDER — POTASSIUM CHLORIDE ER 10 MEQ PO TBCR
10.00 | EXTENDED_RELEASE_TABLET | ORAL | Status: DC
Start: 2018-05-20 — End: 2018-05-22

## 2018-05-22 MED ORDER — GENERIC EXTERNAL MEDICATION
8.00 | Status: DC
Start: 2018-05-18 — End: 2018-05-22

## 2018-05-22 MED ORDER — BIOTENE DRY MOUTH MOISTURIZING MT SOLN
OROMUCOSAL | Status: DC
Start: ? — End: 2018-05-22

## 2018-07-03 DEATH — deceased
